# Patient Record
Sex: Female | Born: 1945 | Race: Black or African American | Hispanic: No | Marital: Married | Smoking: Former smoker
Health system: Southern US, Community
[De-identification: ages and names within clinical notes are randomized; demographics above are authoritative.]

## PROBLEM LIST (undated history)

## (undated) DIAGNOSIS — M549 Dorsalgia, unspecified: Secondary | ICD-10-CM

## (undated) DIAGNOSIS — E669 Obesity, unspecified: Secondary | ICD-10-CM

## (undated) DIAGNOSIS — F319 Bipolar disorder, unspecified: Secondary | ICD-10-CM

## (undated) DIAGNOSIS — Z801 Family history of malignant neoplasm of trachea, bronchus and lung: Secondary | ICD-10-CM

## (undated) DIAGNOSIS — I10 Essential (primary) hypertension: Secondary | ICD-10-CM

## (undated) DIAGNOSIS — R2681 Unsteadiness on feet: Secondary | ICD-10-CM

## (undated) DIAGNOSIS — M1990 Unspecified osteoarthritis, unspecified site: Secondary | ICD-10-CM

## (undated) DIAGNOSIS — K5790 Diverticulosis of intestine, part unspecified, without perforation or abscess without bleeding: Secondary | ICD-10-CM

## (undated) DIAGNOSIS — Z800 Family history of malignant neoplasm of digestive organs: Secondary | ICD-10-CM

## (undated) DIAGNOSIS — R0600 Dyspnea, unspecified: Secondary | ICD-10-CM

## (undated) DIAGNOSIS — F419 Anxiety disorder, unspecified: Secondary | ICD-10-CM

## (undated) DIAGNOSIS — Z8042 Family history of malignant neoplasm of prostate: Secondary | ICD-10-CM

## (undated) DIAGNOSIS — E785 Hyperlipidemia, unspecified: Secondary | ICD-10-CM

## (undated) DIAGNOSIS — K219 Gastro-esophageal reflux disease without esophagitis: Secondary | ICD-10-CM

## (undated) DIAGNOSIS — G8929 Other chronic pain: Secondary | ICD-10-CM

## (undated) DIAGNOSIS — J449 Chronic obstructive pulmonary disease, unspecified: Secondary | ICD-10-CM

## (undated) DIAGNOSIS — R0609 Other forms of dyspnea: Secondary | ICD-10-CM

## (undated) DIAGNOSIS — S82892A Other fracture of left lower leg, initial encounter for closed fracture: Secondary | ICD-10-CM

## (undated) DIAGNOSIS — F17200 Nicotine dependence, unspecified, uncomplicated: Secondary | ICD-10-CM

## (undated) DIAGNOSIS — R296 Repeated falls: Secondary | ICD-10-CM

## (undated) HISTORY — DX: Family history of malignant neoplasm of prostate: Z80.42

## (undated) HISTORY — DX: Hyperlipidemia, unspecified: E78.5

## (undated) HISTORY — DX: Unspecified osteoarthritis, unspecified site: M19.90

## (undated) HISTORY — DX: Family history of malignant neoplasm of digestive organs: Z80.0

## (undated) HISTORY — DX: Other fracture of left lower leg, initial encounter for closed fracture: S82.892A

## (undated) HISTORY — DX: Anxiety disorder, unspecified: F41.9

## (undated) HISTORY — DX: Obesity, unspecified: E66.9

## (undated) HISTORY — DX: Essential (primary) hypertension: I10

## (undated) HISTORY — DX: Gastro-esophageal reflux disease without esophagitis: K21.9

## (undated) HISTORY — DX: Dorsalgia, unspecified: M54.9

## (undated) HISTORY — DX: Other chronic pain: G89.29

## (undated) HISTORY — DX: Nicotine dependence, unspecified, uncomplicated: F17.200

## (undated) HISTORY — PX: SPINE SURGERY: SHX786

## (undated) HISTORY — DX: Family history of malignant neoplasm of trachea, bronchus and lung: Z80.1

## (undated) HISTORY — PX: TONSILLECTOMY: SUR1361

---

## 1978-08-13 HISTORY — PX: OTHER SURGICAL HISTORY: SHX169

## 1982-08-13 HISTORY — PX: TOTAL ABDOMINAL HYSTERECTOMY W/ BILATERAL SALPINGOOPHORECTOMY: SHX83

## 1993-08-13 DIAGNOSIS — E785 Hyperlipidemia, unspecified: Secondary | ICD-10-CM

## 1993-08-13 DIAGNOSIS — I10 Essential (primary) hypertension: Secondary | ICD-10-CM

## 1993-08-13 HISTORY — DX: Hyperlipidemia, unspecified: E78.5

## 1993-08-13 HISTORY — DX: Essential (primary) hypertension: I10

## 1997-08-13 HISTORY — PX: BACK SURGERY: SHX140

## 1998-08-13 DIAGNOSIS — K219 Gastro-esophageal reflux disease without esophagitis: Secondary | ICD-10-CM

## 1998-08-13 HISTORY — DX: Gastro-esophageal reflux disease without esophagitis: K21.9

## 1999-08-14 HISTORY — PX: CHOLECYSTECTOMY: SHX55

## 2000-11-21 ENCOUNTER — Encounter: Payer: Self-pay | Admitting: Internal Medicine

## 2000-11-21 ENCOUNTER — Ambulatory Visit (HOSPITAL_COMMUNITY): Admission: RE | Admit: 2000-11-21 | Discharge: 2000-11-21 | Payer: Self-pay | Admitting: Internal Medicine

## 2000-12-03 ENCOUNTER — Ambulatory Visit (HOSPITAL_COMMUNITY): Admission: RE | Admit: 2000-12-03 | Discharge: 2000-12-03 | Payer: Self-pay | Admitting: Family Medicine

## 2000-12-03 ENCOUNTER — Encounter: Payer: Self-pay | Admitting: Family Medicine

## 2000-12-22 ENCOUNTER — Emergency Department (HOSPITAL_COMMUNITY): Admission: EM | Admit: 2000-12-22 | Discharge: 2000-12-22 | Payer: Self-pay | Admitting: Emergency Medicine

## 2000-12-22 ENCOUNTER — Encounter: Payer: Self-pay | Admitting: Emergency Medicine

## 2000-12-31 ENCOUNTER — Encounter: Payer: Self-pay | Admitting: Family Medicine

## 2000-12-31 ENCOUNTER — Ambulatory Visit (HOSPITAL_COMMUNITY): Admission: RE | Admit: 2000-12-31 | Discharge: 2000-12-31 | Payer: Self-pay | Admitting: Family Medicine

## 2001-04-02 ENCOUNTER — Encounter: Payer: Self-pay | Admitting: Internal Medicine

## 2001-04-02 ENCOUNTER — Emergency Department (HOSPITAL_COMMUNITY): Admission: EM | Admit: 2001-04-02 | Discharge: 2001-04-02 | Payer: Self-pay | Admitting: Internal Medicine

## 2001-11-05 ENCOUNTER — Encounter: Payer: Self-pay | Admitting: Family Medicine

## 2001-11-05 ENCOUNTER — Ambulatory Visit (HOSPITAL_COMMUNITY): Admission: RE | Admit: 2001-11-05 | Discharge: 2001-11-05 | Payer: Self-pay | Admitting: Family Medicine

## 2002-01-12 ENCOUNTER — Emergency Department (HOSPITAL_COMMUNITY): Admission: EM | Admit: 2002-01-12 | Discharge: 2002-01-12 | Payer: Self-pay | Admitting: Emergency Medicine

## 2002-01-23 ENCOUNTER — Encounter: Payer: Self-pay | Admitting: Emergency Medicine

## 2002-01-23 ENCOUNTER — Emergency Department (HOSPITAL_COMMUNITY): Admission: EM | Admit: 2002-01-23 | Discharge: 2002-01-23 | Payer: Self-pay | Admitting: Emergency Medicine

## 2003-07-13 ENCOUNTER — Ambulatory Visit (HOSPITAL_COMMUNITY): Admission: RE | Admit: 2003-07-13 | Discharge: 2003-07-13 | Payer: Self-pay | Admitting: Family Medicine

## 2003-10-18 ENCOUNTER — Ambulatory Visit (HOSPITAL_COMMUNITY): Admission: RE | Admit: 2003-10-18 | Discharge: 2003-10-18 | Payer: Self-pay | Admitting: General Surgery

## 2004-08-16 ENCOUNTER — Ambulatory Visit (HOSPITAL_COMMUNITY): Admission: RE | Admit: 2004-08-16 | Discharge: 2004-08-16 | Payer: Self-pay | Admitting: *Deleted

## 2004-12-27 ENCOUNTER — Ambulatory Visit: Payer: Self-pay | Admitting: Family Medicine

## 2005-01-01 ENCOUNTER — Ambulatory Visit (HOSPITAL_COMMUNITY): Admission: RE | Admit: 2005-01-01 | Discharge: 2005-01-01 | Payer: Self-pay | Admitting: Family Medicine

## 2005-01-07 ENCOUNTER — Emergency Department (HOSPITAL_COMMUNITY): Admission: EM | Admit: 2005-01-07 | Discharge: 2005-01-07 | Payer: Self-pay | Admitting: Emergency Medicine

## 2005-03-01 ENCOUNTER — Ambulatory Visit: Payer: Self-pay | Admitting: Family Medicine

## 2005-04-20 ENCOUNTER — Ambulatory Visit: Payer: Self-pay | Admitting: Family Medicine

## 2005-06-11 ENCOUNTER — Ambulatory Visit: Payer: Self-pay | Admitting: Family Medicine

## 2005-07-24 ENCOUNTER — Ambulatory Visit: Payer: Self-pay | Admitting: Family Medicine

## 2005-11-01 ENCOUNTER — Ambulatory Visit: Payer: Self-pay | Admitting: Family Medicine

## 2005-11-21 ENCOUNTER — Ambulatory Visit: Payer: Self-pay | Admitting: Family Medicine

## 2005-11-21 ENCOUNTER — Emergency Department (HOSPITAL_COMMUNITY): Admission: EM | Admit: 2005-11-21 | Discharge: 2005-11-21 | Payer: Self-pay | Admitting: Emergency Medicine

## 2005-12-18 ENCOUNTER — Ambulatory Visit: Payer: Self-pay | Admitting: Family Medicine

## 2005-12-28 ENCOUNTER — Ambulatory Visit: Payer: Self-pay | Admitting: Family Medicine

## 2006-01-03 ENCOUNTER — Ambulatory Visit (HOSPITAL_COMMUNITY): Admission: RE | Admit: 2006-01-03 | Discharge: 2006-01-03 | Payer: Self-pay | Admitting: Family Medicine

## 2006-02-12 ENCOUNTER — Ambulatory Visit: Payer: Self-pay | Admitting: Family Medicine

## 2006-03-21 ENCOUNTER — Encounter: Admission: RE | Admit: 2006-03-21 | Discharge: 2006-03-21 | Payer: Self-pay | Admitting: Family Medicine

## 2006-03-27 ENCOUNTER — Ambulatory Visit: Payer: Self-pay | Admitting: Family Medicine

## 2006-04-11 ENCOUNTER — Encounter: Admission: RE | Admit: 2006-04-11 | Discharge: 2006-04-11 | Payer: Self-pay | Admitting: Family Medicine

## 2006-05-10 ENCOUNTER — Ambulatory Visit (HOSPITAL_COMMUNITY): Admission: RE | Admit: 2006-05-10 | Discharge: 2006-05-10 | Payer: Self-pay | Admitting: Family Medicine

## 2006-05-17 ENCOUNTER — Encounter: Admission: RE | Admit: 2006-05-17 | Discharge: 2006-05-17 | Payer: Self-pay | Admitting: Family Medicine

## 2006-09-23 ENCOUNTER — Ambulatory Visit: Payer: Self-pay | Admitting: Family Medicine

## 2006-09-23 LAB — CONVERTED CEMR LAB
AST: 14 units/L (ref 0–37)
Albumin: 4.2 g/dL (ref 3.5–5.2)
BUN: 14 mg/dL (ref 6–23)
Bilirubin, Direct: <0.1 mg/dL (ref 0.0–0.3)
CO2: 27 meq/L (ref 19–32)
Calcium: 9.4 mg/dL (ref 8.4–10.5)
LDL Cholesterol: 123 mg/dL — ABNORMAL HIGH (ref 0–99)
Total CHOL/HDL Ratio: 5.4
Total Protein: 6.9 g/dL (ref 6.0–8.3)
VLDL: 53 mg/dL — ABNORMAL HIGH (ref 0–40)

## 2006-10-11 ENCOUNTER — Ambulatory Visit: Payer: Self-pay | Admitting: Family Medicine

## 2007-01-29 ENCOUNTER — Ambulatory Visit: Payer: Self-pay | Admitting: Family Medicine

## 2007-02-25 ENCOUNTER — Encounter: Payer: Self-pay | Admitting: Family Medicine

## 2007-02-25 LAB — CONVERTED CEMR LAB
AST: 12 units/L (ref 0–37)
Albumin: 4.5 g/dL (ref 3.5–5.2)
Cholesterol: 187 mg/dL (ref 0–200)
LDL Cholesterol: 92 mg/dL (ref 0–99)
Total Bilirubin: 0.4 mg/dL (ref 0.3–1.2)
Total CHOL/HDL Ratio: 3.8
Triglycerides: 229 mg/dL — ABNORMAL HIGH (ref <150)
VLDL: 46 mg/dL — ABNORMAL HIGH (ref 0–40)

## 2007-03-29 ENCOUNTER — Inpatient Hospital Stay (HOSPITAL_COMMUNITY): Admission: EM | Admit: 2007-03-29 | Discharge: 2007-03-31 | Payer: Self-pay | Admitting: Emergency Medicine

## 2007-04-02 ENCOUNTER — Ambulatory Visit: Payer: Self-pay | Admitting: Family Medicine

## 2007-04-24 ENCOUNTER — Ambulatory Visit: Payer: Self-pay | Admitting: Thoracic Surgery

## 2007-05-01 ENCOUNTER — Ambulatory Visit: Payer: Self-pay | Admitting: Family Medicine

## 2007-05-01 ENCOUNTER — Encounter: Payer: Self-pay | Admitting: Family Medicine

## 2007-05-01 ENCOUNTER — Other Ambulatory Visit: Admission: RE | Admit: 2007-05-01 | Discharge: 2007-05-01 | Payer: Self-pay | Admitting: Family Medicine

## 2007-05-02 ENCOUNTER — Encounter (INDEPENDENT_AMBULATORY_CARE_PROVIDER_SITE_OTHER): Payer: Self-pay | Admitting: *Deleted

## 2007-07-02 ENCOUNTER — Encounter: Admission: RE | Admit: 2007-07-02 | Discharge: 2007-07-02 | Payer: Self-pay | Admitting: Thoracic Surgery

## 2007-07-02 ENCOUNTER — Ambulatory Visit: Payer: Self-pay | Admitting: Thoracic Surgery

## 2007-07-21 ENCOUNTER — Ambulatory Visit: Payer: Self-pay | Admitting: Family Medicine

## 2007-08-01 ENCOUNTER — Ambulatory Visit (HOSPITAL_COMMUNITY): Admission: RE | Admit: 2007-08-01 | Discharge: 2007-08-01 | Payer: Self-pay | Admitting: Family Medicine

## 2007-08-12 ENCOUNTER — Ambulatory Visit: Payer: Self-pay | Admitting: Family Medicine

## 2007-08-14 ENCOUNTER — Encounter: Payer: Self-pay | Admitting: Family Medicine

## 2007-10-16 ENCOUNTER — Ambulatory Visit: Payer: Self-pay | Admitting: Family Medicine

## 2007-11-10 ENCOUNTER — Ambulatory Visit: Payer: Self-pay | Admitting: Family Medicine

## 2007-11-13 ENCOUNTER — Encounter: Payer: Self-pay | Admitting: Family Medicine

## 2007-11-13 LAB — CONVERTED CEMR LAB
BUN: 15 mg/dL (ref 6–23)
Basophils Absolute: 0 10*3/uL (ref 0.0–0.1)
Basophils Relative: 0 % (ref 0–1)
Eosinophils Absolute: 0.2 10*3/uL (ref 0.0–0.7)
Eosinophils Relative: 4 % (ref 0–5)
HCT: 37.7 % (ref 36.0–46.0)
Hemoglobin: 11.7 g/dL — ABNORMAL LOW (ref 12.0–15.0)
Lymphs Abs: 3.5 10*3/uL (ref 0.7–4.0)
Monocytes Absolute: 0.4 10*3/uL (ref 0.1–1.0)
Monocytes Relative: 8 % (ref 3–12)
Neutrophils Relative %: 21 % — ABNORMAL LOW (ref 43–77)
Platelets: 261 10*3/uL (ref 150–400)
RBC: 4.13 M/uL (ref 3.87–5.11)
RDW: 13.3 % (ref 11.5–15.5)
Sodium: 144 meq/L (ref 135–145)

## 2007-11-14 ENCOUNTER — Encounter: Payer: Self-pay | Admitting: Family Medicine

## 2007-11-14 ENCOUNTER — Encounter (INDEPENDENT_AMBULATORY_CARE_PROVIDER_SITE_OTHER): Payer: Self-pay | Admitting: *Deleted

## 2007-11-14 DIAGNOSIS — E785 Hyperlipidemia, unspecified: Secondary | ICD-10-CM

## 2007-11-14 DIAGNOSIS — K219 Gastro-esophageal reflux disease without esophagitis: Secondary | ICD-10-CM

## 2007-11-14 DIAGNOSIS — F419 Anxiety disorder, unspecified: Secondary | ICD-10-CM

## 2007-11-14 LAB — CONVERTED CEMR LAB
ALT: 17 units/L (ref 0–35)
Albumin: 4.1 g/dL (ref 3.5–5.2)
Alkaline Phosphatase: 90 units/L (ref 39–117)
Direct LDL: 85 mg/dL
Total Bilirubin: 0.2 mg/dL — ABNORMAL LOW (ref 0.3–1.2)
Total Protein: 6.5 g/dL (ref 6.0–8.3)

## 2007-12-31 ENCOUNTER — Ambulatory Visit: Payer: Self-pay | Admitting: Thoracic Surgery

## 2007-12-31 ENCOUNTER — Encounter: Admission: RE | Admit: 2007-12-31 | Discharge: 2007-12-31 | Payer: Self-pay | Admitting: Thoracic Surgery

## 2008-01-02 ENCOUNTER — Ambulatory Visit (HOSPITAL_COMMUNITY): Admission: RE | Admit: 2008-01-02 | Discharge: 2008-01-02 | Payer: Self-pay | Admitting: Thoracic Surgery

## 2008-01-14 ENCOUNTER — Ambulatory Visit: Payer: Self-pay | Admitting: Thoracic Surgery

## 2008-03-18 ENCOUNTER — Encounter: Payer: Self-pay | Admitting: Family Medicine

## 2008-03-18 ENCOUNTER — Ambulatory Visit: Payer: Self-pay | Admitting: Family Medicine

## 2008-03-24 ENCOUNTER — Encounter: Payer: Self-pay | Admitting: Family Medicine

## 2008-03-24 ENCOUNTER — Encounter: Admission: RE | Admit: 2008-03-24 | Discharge: 2008-03-24 | Payer: Self-pay | Admitting: Family Medicine

## 2008-03-24 ENCOUNTER — Telehealth: Payer: Self-pay | Admitting: Family Medicine

## 2008-04-05 ENCOUNTER — Telehealth: Payer: Self-pay | Admitting: Family Medicine

## 2008-04-13 ENCOUNTER — Encounter: Admission: RE | Admit: 2008-04-13 | Discharge: 2008-04-13 | Payer: Self-pay | Admitting: Family Medicine

## 2008-04-13 ENCOUNTER — Encounter: Payer: Self-pay | Admitting: Family Medicine

## 2008-04-27 ENCOUNTER — Encounter: Admission: RE | Admit: 2008-04-27 | Discharge: 2008-04-27 | Payer: Self-pay | Admitting: Family Medicine

## 2008-05-04 ENCOUNTER — Encounter: Payer: Self-pay | Admitting: Family Medicine

## 2008-05-31 ENCOUNTER — Telehealth: Payer: Self-pay | Admitting: Family Medicine

## 2008-06-07 ENCOUNTER — Ambulatory Visit: Payer: Self-pay | Admitting: Family Medicine

## 2008-06-07 LAB — CONVERTED CEMR LAB
AST: 13 units/L (ref 0–37)
Alkaline Phosphatase: 70 units/L (ref 39–117)
Calcium: 10 mg/dL (ref 8.4–10.5)
Chloride: 105 meq/L (ref 96–112)
Cholesterol: 309 mg/dL — ABNORMAL HIGH (ref 0–200)
Creatinine, Ser: 0.8 mg/dL (ref 0.40–1.20)
Glucose, Bld: 89 mg/dL (ref 70–99)
HDL: 74 mg/dL (ref >39)
LDL Cholesterol: 183 mg/dL — ABNORMAL HIGH (ref 0–99)
Triglycerides: 258 mg/dL — ABNORMAL HIGH (ref <150)
VLDL: 52 mg/dL — ABNORMAL HIGH (ref 0–40)

## 2008-06-09 ENCOUNTER — Ambulatory Visit: Payer: Self-pay | Admitting: Family Medicine

## 2008-06-09 DIAGNOSIS — I10 Essential (primary) hypertension: Secondary | ICD-10-CM | POA: Insufficient documentation

## 2008-06-09 DIAGNOSIS — M542 Cervicalgia: Secondary | ICD-10-CM | POA: Insufficient documentation

## 2008-06-09 DIAGNOSIS — K439 Ventral hernia without obstruction or gangrene: Secondary | ICD-10-CM | POA: Insufficient documentation

## 2008-06-11 ENCOUNTER — Encounter: Payer: Self-pay | Admitting: Family Medicine

## 2008-06-16 ENCOUNTER — Ambulatory Visit (HOSPITAL_COMMUNITY): Admission: RE | Admit: 2008-06-16 | Discharge: 2008-06-16 | Payer: Self-pay | Admitting: Family Medicine

## 2008-06-18 ENCOUNTER — Encounter: Payer: Self-pay | Admitting: Family Medicine

## 2008-06-22 ENCOUNTER — Telehealth: Payer: Self-pay | Admitting: Family Medicine

## 2008-07-07 ENCOUNTER — Telehealth: Payer: Self-pay | Admitting: Family Medicine

## 2008-08-13 ENCOUNTER — Encounter: Payer: Self-pay | Admitting: Family Medicine

## 2008-08-17 HISTORY — PX: INCISIONAL HERNIA REPAIR: SHX193

## 2008-08-18 ENCOUNTER — Inpatient Hospital Stay (HOSPITAL_COMMUNITY): Admission: RE | Admit: 2008-08-18 | Discharge: 2008-08-19 | Payer: Self-pay | Admitting: General Surgery

## 2008-08-19 ENCOUNTER — Encounter: Payer: Self-pay | Admitting: Family Medicine

## 2008-09-13 ENCOUNTER — Encounter: Payer: Self-pay | Admitting: Family Medicine

## 2008-09-14 ENCOUNTER — Ambulatory Visit: Payer: Self-pay | Admitting: Family Medicine

## 2008-09-14 LAB — CONVERTED CEMR LAB
ALT: 17 units/L (ref 0–35)
Alkaline Phosphatase: 81 units/L (ref 39–117)
Cholesterol: 144 mg/dL (ref 0–200)
Indirect Bilirubin: 0.3 mg/dL (ref 0.0–0.9)
Triglycerides: 165 mg/dL — ABNORMAL HIGH (ref <150)

## 2008-11-23 ENCOUNTER — Telehealth: Payer: Self-pay | Admitting: Family Medicine

## 2009-01-05 ENCOUNTER — Telehealth: Payer: Self-pay | Admitting: Family Medicine

## 2009-02-16 ENCOUNTER — Telehealth: Payer: Self-pay | Admitting: Family Medicine

## 2009-02-25 ENCOUNTER — Ambulatory Visit: Payer: Self-pay | Admitting: Family Medicine

## 2009-02-28 LAB — CONVERTED CEMR LAB
ALT: 14 units/L (ref 0–35)
AST: 14 units/L (ref 0–37)
Alkaline Phosphatase: 67 units/L (ref 39–117)
BUN: 21 mg/dL (ref 6–23)
Bilirubin, Direct: 0.1 mg/dL (ref 0.0–0.3)
CO2: 27 meq/L (ref 19–32)
Cholesterol: 124 mg/dL (ref 0–200)
Glucose, Bld: 83 mg/dL (ref 70–99)
Indirect Bilirubin: 0.3 mg/dL (ref 0.0–0.9)
Total Protein: 7.2 g/dL (ref 6.0–8.3)
Triglycerides: 231 mg/dL — ABNORMAL HIGH (ref <150)
VLDL: 46 mg/dL — ABNORMAL HIGH (ref 0–40)

## 2009-03-23 ENCOUNTER — Encounter: Admission: RE | Admit: 2009-03-23 | Discharge: 2009-03-23 | Payer: Self-pay | Admitting: Thoracic Surgery

## 2009-03-23 ENCOUNTER — Ambulatory Visit: Payer: Self-pay | Admitting: Thoracic Surgery

## 2009-04-04 ENCOUNTER — Ambulatory Visit: Payer: Self-pay | Admitting: Family Medicine

## 2009-04-04 DIAGNOSIS — M5430 Sciatica, unspecified side: Secondary | ICD-10-CM

## 2009-04-04 DIAGNOSIS — M549 Dorsalgia, unspecified: Secondary | ICD-10-CM

## 2009-04-15 ENCOUNTER — Telehealth: Payer: Self-pay | Admitting: Family Medicine

## 2009-04-19 ENCOUNTER — Ambulatory Visit (HOSPITAL_COMMUNITY): Admission: RE | Admit: 2009-04-19 | Discharge: 2009-04-19 | Payer: Self-pay | Admitting: Family Medicine

## 2009-05-09 ENCOUNTER — Telehealth: Payer: Self-pay | Admitting: Family Medicine

## 2009-05-12 ENCOUNTER — Telehealth: Payer: Self-pay | Admitting: Family Medicine

## 2009-05-12 ENCOUNTER — Ambulatory Visit: Payer: Self-pay | Admitting: Family Medicine

## 2009-05-17 ENCOUNTER — Encounter: Payer: Self-pay | Admitting: Family Medicine

## 2009-06-03 ENCOUNTER — Encounter: Payer: Self-pay | Admitting: Family Medicine

## 2009-08-09 ENCOUNTER — Encounter: Payer: Self-pay | Admitting: Family Medicine

## 2009-09-01 ENCOUNTER — Encounter: Payer: Self-pay | Admitting: Family Medicine

## 2009-09-08 ENCOUNTER — Ambulatory Visit: Payer: Self-pay | Admitting: Family Medicine

## 2009-09-08 DIAGNOSIS — R5383 Other fatigue: Secondary | ICD-10-CM

## 2009-09-08 DIAGNOSIS — E559 Vitamin D deficiency, unspecified: Secondary | ICD-10-CM | POA: Insufficient documentation

## 2009-09-08 DIAGNOSIS — R5381 Other malaise: Secondary | ICD-10-CM

## 2009-09-12 ENCOUNTER — Encounter: Payer: Self-pay | Admitting: Family Medicine

## 2009-09-12 ENCOUNTER — Telehealth: Payer: Self-pay | Admitting: Family Medicine

## 2009-09-13 LAB — CONVERTED CEMR LAB
ALT: 9 units/L (ref 0–35)
BUN: 14 mg/dL (ref 6–23)
Basophils Absolute: 0 10*3/uL (ref 0.0–0.1)
Bilirubin, Direct: 0.1 mg/dL (ref 0.0–0.3)
CO2: 28 meq/L (ref 19–32)
Cholesterol: 124 mg/dL (ref 0–200)
Eosinophils Relative: 2 % (ref 0–5)
Glucose, Bld: 102 mg/dL — ABNORMAL HIGH (ref 70–99)
HCT: 42.3 % (ref 36.0–46.0)
Hemoglobin: 13 g/dL (ref 12.0–15.0)
Indirect Bilirubin: 0.2 mg/dL (ref 0.0–0.9)
LDL Cholesterol: 38 mg/dL (ref 0–99)
Lymphocytes Relative: 54 % — ABNORMAL HIGH (ref 12–46)
MCHC: 30.7 g/dL (ref 30.0–36.0)
Monocytes Absolute: 0.4 10*3/uL (ref 0.1–1.0)
Monocytes Relative: 8 % (ref 3–12)
Potassium: 4.6 meq/L (ref 3.5–5.3)
RDW: 13.1 % (ref 11.5–15.5)
TSH: 0.561 microintl units/mL (ref 0.350–4.500)
Total Bilirubin: 0.3 mg/dL (ref 0.3–1.2)
VLDL: 37 mg/dL (ref 0–40)
Vit D, 25-Hydroxy: <10 ng/mL — ABNORMAL LOW (ref 30–89)

## 2009-09-15 ENCOUNTER — Ambulatory Visit (HOSPITAL_COMMUNITY): Admission: RE | Admit: 2009-09-15 | Discharge: 2009-09-15 | Payer: Self-pay | Admitting: Family Medicine

## 2009-12-06 ENCOUNTER — Encounter: Payer: Self-pay | Admitting: Family Medicine

## 2009-12-09 ENCOUNTER — Ambulatory Visit (HOSPITAL_COMMUNITY): Admission: RE | Admit: 2009-12-09 | Discharge: 2009-12-09 | Payer: Self-pay | Admitting: Family Medicine

## 2009-12-09 ENCOUNTER — Ambulatory Visit: Payer: Self-pay | Admitting: Family Medicine

## 2009-12-09 DIAGNOSIS — M25579 Pain in unspecified ankle and joints of unspecified foot: Secondary | ICD-10-CM | POA: Insufficient documentation

## 2009-12-13 ENCOUNTER — Ambulatory Visit (HOSPITAL_COMMUNITY): Admission: RE | Admit: 2009-12-13 | Discharge: 2009-12-13 | Payer: Self-pay | Admitting: Neurosurgery

## 2010-01-06 DIAGNOSIS — S82892A Other fracture of left lower leg, initial encounter for closed fracture: Secondary | ICD-10-CM

## 2010-01-06 HISTORY — DX: Other fracture of left lower leg, initial encounter for closed fracture: S82.892A

## 2010-01-11 ENCOUNTER — Ambulatory Visit: Payer: Self-pay | Admitting: Thoracic Surgery

## 2010-01-11 ENCOUNTER — Encounter: Admission: RE | Admit: 2010-01-11 | Discharge: 2010-01-11 | Payer: Self-pay | Admitting: Thoracic Surgery

## 2010-01-11 ENCOUNTER — Encounter: Payer: Self-pay | Admitting: Family Medicine

## 2010-01-18 ENCOUNTER — Ambulatory Visit: Payer: Self-pay | Admitting: Family Medicine

## 2010-01-25 LAB — CONVERTED CEMR LAB
ALT: 16 units/L (ref 0–35)
AST: 17 units/L (ref 0–37)
Albumin: 4.8 g/dL (ref 3.5–5.2)
Alkaline Phosphatase: 84 units/L (ref 39–117)
Cholesterol: 132 mg/dL (ref 0–200)
HDL: 42 mg/dL (ref >39)
Total CHOL/HDL Ratio: 3.1
Total Protein: 7.6 g/dL (ref 6.0–8.3)
Triglycerides: 296 mg/dL — ABNORMAL HIGH (ref <150)
Vit D, 25-Hydroxy: 61 ng/mL (ref 30–89)

## 2010-02-08 ENCOUNTER — Encounter: Admission: RE | Admit: 2010-02-08 | Discharge: 2010-02-08 | Payer: Self-pay | Admitting: Thoracic Surgery

## 2010-02-08 ENCOUNTER — Ambulatory Visit: Payer: Self-pay | Admitting: Thoracic Surgery

## 2010-02-17 ENCOUNTER — Encounter: Payer: Self-pay | Admitting: Family Medicine

## 2010-02-17 ENCOUNTER — Ambulatory Visit: Payer: Self-pay | Admitting: Thoracic Surgery

## 2010-03-27 ENCOUNTER — Inpatient Hospital Stay (HOSPITAL_COMMUNITY): Admission: RE | Admit: 2010-03-27 | Discharge: 2010-03-30 | Payer: Self-pay | Admitting: Thoracic Surgery

## 2010-03-27 ENCOUNTER — Encounter: Payer: Self-pay | Admitting: Thoracic Surgery

## 2010-03-27 ENCOUNTER — Ambulatory Visit: Payer: Self-pay | Admitting: Thoracic Surgery

## 2010-03-27 HISTORY — PX: OTHER SURGICAL HISTORY: SHX169

## 2010-04-05 ENCOUNTER — Encounter: Admission: RE | Admit: 2010-04-05 | Discharge: 2010-04-05 | Payer: Self-pay | Admitting: Thoracic Surgery

## 2010-04-05 ENCOUNTER — Ambulatory Visit: Payer: Self-pay | Admitting: Thoracic Surgery

## 2010-04-06 ENCOUNTER — Telehealth: Payer: Self-pay | Admitting: Family Medicine

## 2010-04-26 ENCOUNTER — Ambulatory Visit: Payer: Self-pay | Admitting: Thoracic Surgery

## 2010-04-26 ENCOUNTER — Encounter: Payer: Self-pay | Admitting: Family Medicine

## 2010-04-26 ENCOUNTER — Encounter: Admission: RE | Admit: 2010-04-26 | Discharge: 2010-04-26 | Payer: Self-pay | Admitting: Thoracic Surgery

## 2010-05-03 ENCOUNTER — Ambulatory Visit: Payer: Self-pay | Admitting: Thoracic Surgery

## 2010-05-18 ENCOUNTER — Telehealth: Payer: Self-pay | Admitting: Family Medicine

## 2010-05-24 ENCOUNTER — Ambulatory Visit: Payer: Self-pay | Admitting: Family Medicine

## 2010-05-31 ENCOUNTER — Telehealth: Payer: Self-pay | Admitting: Family Medicine

## 2010-06-19 ENCOUNTER — Telehealth: Payer: Self-pay | Admitting: Family Medicine

## 2010-06-30 ENCOUNTER — Telehealth: Payer: Self-pay | Admitting: Family Medicine

## 2010-07-26 ENCOUNTER — Ambulatory Visit: Payer: Self-pay | Admitting: Thoracic Surgery

## 2010-08-11 ENCOUNTER — Telehealth: Payer: Self-pay | Admitting: Family Medicine

## 2010-09-03 ENCOUNTER — Encounter: Payer: Self-pay | Admitting: Thoracic Surgery

## 2010-09-03 ENCOUNTER — Encounter: Payer: Self-pay | Admitting: Family Medicine

## 2010-09-03 ENCOUNTER — Encounter: Payer: Self-pay | Admitting: Internal Medicine

## 2010-09-04 ENCOUNTER — Encounter: Payer: Self-pay | Admitting: Family Medicine

## 2010-09-14 NOTE — Progress Notes (Signed)
Summary: rx  Phone Note Call from Patient   Summary of Call: needs her hydrocodone and ultram [facility] to [facility] Initial call taken by: Lind Guest,  May 18, 2010 1:31 PM    Prescriptions: TRAMADOL HCL 50 MG  TABS (TRAMADOL HCL) two tabs by mouth two times a day  #120 x 0   Entered by:   Everitt Amber LPN   Authorized by:   Syliva Overman MD   Signed by:   Everitt Amber LPN on 14/78/2956   Method used:   Printed then faxed to ...       [facility]  [address] (retail)       [address]       [place], [place]  [address]       Ph: [phone]       Fax: [phone]   RxID:   [id number] HYDROCODONE-ACETAMINOPHEN 5-500 MG  TABS (HYDROCODONE-ACETAMINOPHEN) one tab by mouth three times a day  #90 x 0   Entered by:   Everitt Amber LPN   Authorized by:   Syliva Overman MD   Signed by:   Everitt Amber LPN on 25/95/6387   Method used:   Printed then faxed to ...       [facility] (retail)       [address]       [place], [place]  [address]       Ph: [phone]       Fax: [phone]   RxID:   [id number]

## 2010-09-14 NOTE — Assessment & Plan Note (Signed)
Summary: F UP   Vital Signs:  Patient profile:   65 year old female Menstrual status:  hysterectomy Height:      64 inches Weight:      197 pounds BMI:     33.94 O2 Sat:      90 % Pulse rate:   87 / minute Pulse rhythm:   regular Resp:     16 per minute BP sitting:   110 / 68  (left arm) Cuff size:   large  Vitals Entered By: Everitt Amber LPN (January 19, 5620 9:07 AM)  Nutrition Counseling: Patient's BMI is greater than 25 and therefore counseled on weight management options. CC: Follow up chronic problems   CC:  Follow up chronic problems.  History of Present Illness: pt was scheduled for bck surgery in May, by dr Channing Mutters, however a few days before she fell and obtained a  hairline fracture of her left leg whichis followed by dr Farris Has. 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 she denies cardiovasculr symptoms. had all the preop workup in may for back surgery,  Gets chilly at times, uses ac, no cough, no respiratory symptoms. No urinary symptoms .  Preventive Screening-Counseling & Management  Alcohol-Tobacco     Smoking Cessation Counseling: yes  Current Medications (verified): 1)  Zanaflex 4 Mg  Tabs (Tizanidine Hcl) .... One Tab By Mouth Three Times A Day 2)  Klor-Con M20 20 Meq  Tbcr (Potassium Chloride Crys Cr) .... One Tab By Mouth Two Times A Day 3)  Tramadol Hcl 50 Mg  Tabs (Tramadol Hcl) .... Two Tabs By Mouth Two Times A Day 4)  Cardizem La 120 Mg  Tb24 (Diltiazem Hcl Coated Beads) .... One Tab By Mouth Once Daily 5)  Furosemide 20 Mg  Tabs (Furosemide) .... One Tab By Mouth Two Times A Day 6)  Lisinopril 10 Mg  Tabs (Lisinopril) .... One Tab By Mouth Once Daily 7)  Celebrex 200 Mg  Caps (Celecoxib) .... One Tab By Mouth Bid 8)  Aspirin 81 Mg  Tabs (Aspirin) .... One Tab By Mouth Once Daily 9)  Omeprazole 40 Mg Cpdr (Omeprazole) .... Take 1 Tablet By Mouth Once A  Day 10)  Epipen 0.3 Mg/0.48ml (1:1000)  Devi (Epinephrine Hcl (Anaphylaxis)) .... Uad 11)  Hydrocodone-Acetaminophen 5-500 Mg  Tabs (Hydrocodone-Acetaminophen) .... One Tab By Mouth Three Times A Day 12)  Alprazolam 1 Mg Tabs (Alprazolam) .... One Tab By Mouth Bid 13)  Crestor 40 Mg Tabs (Rosuvastatin Calcium) .... One Tab By Mouth Qhs 14)  Omeprazole 40 Mg Cpdr (Omeprazole) .... Take 1 Capsule By Mouth Once A Day 15)  Vitamin D (Ergocalciferol) 50000 Unit Caps (Ergocalciferol) .... One Cap By Mouth Q Week  Allergies (verified): 1)  ! Codeine  Past History:  Past Medical History: Current Problems:  NICOTINE ADDICTION (ICD-305.1) GERD (ICD-530.81) ANXIETY (ICD-300.00) BACK PAIN, CHRONIC (ICD-724.5) OBESITY (ICD-278.00) HYPERLIPIDEMIA (ICD-272.4) hairline fracture left leg Jan 06, 2010 dr Farris Has is treating .  Review of Systems      See HPI General:  Complains of fatigue and sleep disorder; anxious about increased size of chest tumor, she is still smoking but trying to quit. Eyes:  Denies blurring and discharge. CV:  Denies chest pain or discomfort, difficulty breathing while lying down, palpitations, and swelling of feet. Resp:  Complains of cough; denies shortness of breath and sputum productive. GI:  Denies abdominal pain,  constipation, diarrhea, nausea, and vomiting. GU:  Denies dysuria and urinary frequency. MS:  Complains of joint pain, low back pain, mid back pain, muscle weakness, and stiffness. Psych:  Complains of anxiety; denies depression. Heme:  Denies abnormal bruising, bleeding, enlarge lymph nodes, fevers, pallor, and skin discoloration. Allergy:  Denies hives or rash.  Physical Exam  General:  Well-developed,well-nourished,in no acute distress; alert,appropriate and cooperative throughout examination HEENT: No facial asymmetry,  EOMI, No sinus tenderness, TM's Clear, oropharynx  pink and moist.   Chest: Clear to auscultation bilaterally.  CVS: S1, S2, No  murmurs, No S3.   Abd: Soft, Nontender.  MS: Adequate ROM spine, hips, shoulders and knees.  Ext: No edema.   CNS: CN 2-12 intact, power tone and sensation normal throughout.   Skin: Intact, no visible lesions or rashes.  Psych: Good eye contact, normal affect.  Memory intact, not anxious or depressed appearing.    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 Channing Mutters  Problem # 2:  PULMONARY NODULE (ICD-518.89) Assessment: Deteriorated for removal asap, pt has ongoing nicotine use and is encouraged again to quit  Problem # 3:  HYPERTENSION (ICD-401.9) Assessment: Unchanged  Her updated medication list for this problem includes:    Cardizem La 120 Mg Tb24 (Diltiazem hcl coated beads) ..... One tab by mouth once daily    Furosemide 20 Mg Tabs (Furosemide) ..... One tab by mouth two times a day    Lisinopril 10 Mg Tabs (Lisinopril) ..... One tab by mouth once daily  BP today: 110/68 Prior BP: 110/60 (12/09/2009)  Labs Reviewed: K+: 4.6 (09/12/2009) Creat: : 0.81 (09/12/2009)   Chol: 124 (09/12/2009)   HDL: 49 (09/12/2009)   LDL: 38 (09/12/2009)   TG: 186 (09/12/2009)  Problem # 4:  ANXIETY (ICD-300.00) Assessment: Deteriorated  Her updated medication list for this problem includes:    Alprazolam 1 Mg Tabs (Alprazolam) ..... One tab by mouth bid  Problem # 5:  NICOTINE ADDICTION (ICD-305.1) Assessment: Unchanged  Encouraged smoking cessation and discussed different methods for smoking cessation.   Problem # 6:  OBESITY (ICD-278.00) Assessment: Unchanged  Ht: 64 (01/18/2010)   Wt: 197 (01/18/2010)   BMI: 33.94 (01/18/2010)  Complete Medication List: 1)  Zanaflex 4 Mg Tabs (Tizanidine hcl) .... One tab by mouth three times a day 2)  Klor-con M20 20 Meq Tbcr (Potassium chloride crys cr) .... One tab by mouth two times a day 3)  Tramadol  Hcl 50 Mg Tabs (Tramadol hcl) .... Two tabs by mouth two times a day 4)  Cardizem La 120 Mg Tb24 (Diltiazem hcl coated beads) .... One tab by mouth once daily 5)  Furosemide 20 Mg Tabs (Furosemide) .... One tab by mouth two times a day 6)  Lisinopril 10 Mg Tabs (Lisinopril) .... One tab by mouth once daily 7)  Celebrex 200 Mg Caps (Celecoxib) .... One tab by mouth bid 8)  Aspirin 81 Mg Tabs (Aspirin) .... One tab by mouth once daily 9)  Omeprazole 40 Mg Cpdr (Omeprazole) .... Take 1 tablet by mouth once a day 10)  Epipen 0.3 Mg/0.57ml (1:1000) Devi (Epinephrine hcl (anaphylaxis)) .... Uad 11)  Hydrocodone-acetaminophen 5-500 Mg Tabs (Hydrocodone-acetaminophen) .... One tab by mouth three times a day 12)  Alprazolam 1 Mg Tabs (Alprazolam) .... One tab by mouth bid 13)  Crestor 40 Mg Tabs (Rosuvastatin calcium) .... One tab  by mouth qhs 14)  Omeprazole 40 Mg Cpdr (Omeprazole) .... Take 1 capsule by mouth once a day 15)  Vitamin D (ergocalciferol) 50000 Unit Caps (Ergocalciferol) .... One cap by mouth q week  Other Orders: T-Hepatic Function [id number]) T-Lipid Profile [id number]) T-Vitamin D (25-Hydroxy) [phone])  Patient Instructions: 1)  Please schedule a follow-up appointment in 4 months. 2)  Tobacco is very bad for your health and your loved ones! You Should stop smoking!. 3)  Stop Smoking Tips: Choose a Quit date. Cut down before the Quit date. decide what you will do as a substitute when you feel the urge to smoke(gum,toothpick,exercise). 4)  It is important that you exercise regularly at least 20 minutes 5 times a week. If you develop chest pain, have severe difficulty breathing, or feel very tired , stop exercising immediately and seek medical attention. 5)  You need to lose weight. Consider a lower calorie diet and regular exercise.  6)  Hepatic Panel prior to visit, ICD-9: 7)  Lipid Panel prior to visit, ICD-9:   fasting this week 8)  vitamin D  9)  you need to start a  calcium and vitamin D supplement. 10)  you will need a boe density scan later this year.

## 2010-09-14 NOTE — Assessment & Plan Note (Signed)
Summary: office visit   Vital Signs:  Patient profile:   65 year old female Menstrual status:  hysterectomy Height:      64 inches Weight:      187.25 pounds BMI:     32.26 O2 Sat:      94 % Pulse rate:   91 / minute Pulse rhythm:   regular Resp:     16 per minute BP sitting:   114 / 78  Vitals Entered By: Everitt Amber (September 08, 2009 9:00 AM)  Nutrition Counseling: Patient's BMI is greater than 25 and therefore counseled on weight management options. CC: Follow up chronic problems, already scheduled for back surgery    CC:  Follow up chronic problems and already scheduled for back surgery .  History of Present Illness: Reports  that tshe has been doing well. Denies recent fever or chills. Denies sinus pressure, nasal congestion , ear pain or sore throat. Denies chest congestion, or cough productive of sputum. Denies chest pain, palpitations, PND, orthopnea or leg swelling. Denies abdominal pain, nausea, vomitting, diarrhea or constipation. Denies change in bowel movements or bloody stool. Denies dysuria , frequency, incontinence or hesitancy. Continues to have significant back pain and stiffness, plans to have back surgery in the Spring Denies headaches, vertigo, seizures. Denies depression, anxiety or insomnia. Denies  rash, lesions, or itch.     Current Medications (verified): 1)  Zanaflex 4 Mg  Tabs (Tizanidine Hcl) .... One Tab By Mouth Three Times A Day 2)  Klor-Con M20 20 Meq  Tbcr (Potassium Chloride Crys Cr) .... One Tab By Mouth Two Times A Day 3)  Tramadol Hcl 50 Mg  Tabs (Tramadol Hcl) .... Two Tabs By Mouth Two Times A Day 4)  Cardizem La 120 Mg  Tb24 (Diltiazem Hcl Coated Beads) .... One Tab By Mouth Once Daily 5)  Furosemide 20 Mg  Tabs (Furosemide) .... One Tab By Mouth Two Times A Day 6)  Lisinopril 10 Mg  Tabs (Lisinopril) .... One Tab By Mouth Once Daily 7)  Celebrex 200 Mg  Caps (Celecoxib) .... One Tab By Mouth Bid 8)  Aspirin 81 Mg  Tabs  (Aspirin) .... One Tab By Mouth Once Daily 9)  Nexium 40 Mg  Cpdr (Esomeprazole Magnesium) .... One Cap By Mouth Once Daily 10)  Epipen 0.3 Mg/0.69ml (1:1000)  Devi (Epinephrine Hcl (Anaphylaxis)) .... Uad 11)  Hydrocodone-Acetaminophen 5-500 Mg  Tabs (Hydrocodone-Acetaminophen) .... One Tab By Mouth Three Times A Day 12)  Alprazolam 1 Mg Tabs (Alprazolam) .... One Tab By Mouth Bid 13)  Crestor 40 Mg Tabs (Rosuvastatin Calcium) .... One Tab By Mouth Qhs  Allergies (verified): 1)  ! Codeine  Review of Systems      See HPI Eyes:  Denies blurring. Heme:  Denies abnormal bruising and bleeding. Allergy:  Complains of hives or rash.  Physical Exam  General:  alert, well-hydrated, and overweight-appearing. HEENT: No facial asymmetry,  EOMI, No sinus tenderness, TM's Clear, oropharynx  pink and moist.   Chest: Clear to auscultation bilaterally. Decrfeased air entry bilaterally CVS: S1, S2, No murmurs, No S3.   Abd: Soft, Nontender.  MS: markedly reduced ROM spine , adequate hips, shoulders and knees.  Ext: No edema.   CNS: CN 2-12 intactreduced, power tone and sensation in right lower extremity   Skin: Intact, no visible lesions or rashes.  Psych: Good eye contact, normal affect.  Memory intact, not anxious or depressed appearing.      Impression & Recommendations:  Problem #  1:  HYPERTENSION (ICD-401.9) Assessment Unchanged  Her updated medication list for this problem includes:    Cardizem La 120 Mg Tb24 (Diltiazem hcl coated beads) ..... One tab by mouth once daily    Furosemide 20 Mg Tabs (Furosemide) ..... One tab by mouth two times a day    Lisinopril 10 Mg Tabs (Lisinopril) ..... One tab by mouth once daily  Orders: T-Basic Metabolic Panel [id number])  BP today: 114/78 Prior BP: 112/74 (04/04/2009)  Labs Reviewed: K+: 4.7 (02/25/2009) Creat: : 1.05 (02/25/2009)   Chol: 124 (02/25/2009)   HDL: 44 (02/25/2009)   LDL: 34 (02/25/2009)   TG: 231 (02/25/2009)  Problem #  2:  HYPERLIPIDEMIA (ICD-272.4) Assessment: Comment Only  Her updated medication list for this problem includes:    Crestor 40 Mg Tabs (Rosuvastatin calcium) ..... One tab by mouth qhs  Labs Reviewed: SGOT: 14 (02/25/2009)   SGPT: 14 (02/25/2009)   HDL:44 (02/25/2009), 59 (09/14/2008)  LDL:34 (02/25/2009), 52 (09/14/2008)  Chol:124 (02/25/2009), 144 (09/14/2008)  Trig:231 (02/25/2009), 165 (09/14/2008)  Problem # 3:  GERD (ICD-530.81) Assessment: Deteriorated  Her updated medication list for this problem includes:    Nexium 40 Mg Cpdr (Esomeprazole magnesium) ..... One cap by mouth once daily    Omeprazole 40 Mg Cpdr (Omeprazole) .Marland Kitchen... Take 1 capsule by mouth once a day  Complete Medication List: 1)  Zanaflex 4 Mg Tabs (Tizanidine hcl) .... One tab by mouth three times a day 2)  Klor-con M20 20 Meq Tbcr (Potassium chloride crys cr) .... One tab by mouth two times a day 3)  Tramadol Hcl 50 Mg Tabs (Tramadol hcl) .... Two tabs by mouth two times a day 4)  Cardizem La 120 Mg Tb24 (Diltiazem hcl coated beads) .... One tab by mouth once daily 5)  Furosemide 20 Mg Tabs (Furosemide) .... One tab by mouth two times a day 6)  Lisinopril 10 Mg Tabs (Lisinopril) .... One tab by mouth once daily 7)  Celebrex 200 Mg Caps (Celecoxib) .... One tab by mouth bid 8)  Aspirin 81 Mg Tabs (Aspirin) .... One tab by mouth once daily 9)  Nexium 40 Mg Cpdr (Esomeprazole magnesium) .... One cap by mouth once daily 10)  Epipen 0.3 Mg/0.95ml (1:1000) Devi (Epinephrine hcl (anaphylaxis)) .... Uad 11)  Hydrocodone-acetaminophen 5-500 Mg Tabs (Hydrocodone-acetaminophen) .... One tab by mouth three times a day 12)  Alprazolam 1 Mg Tabs (Alprazolam) .... One tab by mouth bid 13)  Crestor 40 Mg Tabs (Rosuvastatin calcium) .... One tab by mouth qhs 14)  Tussionex Pennkinetic Er 8-10 Mg/72ml Lqcr (Chlorpheniramine-hydrocodone) .Marland Kitchen.. 1 tsp q 12 hrs as needed 15)  Omeprazole 40 Mg Cpdr (Omeprazole) .... Take 1 capsule by  mouth once a day  Other Orders: T-Hepatic Function [id number]) T-Lipid Profile [id number]) T-CBC w/Diff [id number]) T-TSH [id number]) T-Vitamin D (25-Hydroxy) [phone]) Radiology Referral (Radiology)  Patient Instructions: 1)  Please schedule a follow-up appointment in 4.5 months. 2)  It is important that you exercise regularly at least 20 minutes 5 times a week. If you develop chest pain, have severe difficulty breathing, or feel very tired , stop exercising immediately and seek medical attention. 3)  You need to lose weight. Consider a lower calorie diet and regular exercise. CONGRATS  on your 10 pound weight loss, pls keep ot up  4)  No med changes at this time 5)  nO mORE cIGARETTE pURCHASES 9we already said you do not beg) 6)  mamo to be scheduled at checkout 7)  BMP prior to  visit, ICD-9: 8)  Hepatic Panel prior to visit, ICD-9: 9)  Lipid Panel prior to visit, ICD-9: 10)  TSH prior to visit, ICD-9: 11)  CBC w/ Diff prior to visit, ICD-9: 12)  vit D level Prescriptions: OMEPRAZOLE 40 MG CPDR (OMEPRAZOLE) Take 1 capsule by mouth once a day  #30 x 4   Entered and Authorized by:   Syliva Overman MD   Signed by:   Syliva Overman MD on 09/12/2009   Method used:   Electronically to        [facility] (retail)       [address]       [place], [place]  [address]       Ph: [phone]       Fax: [phone]   RxID:   [id number] Sandria Senter ER 8-10 MG/5ML LQCR (CHLORPHENIRAMINE-HYDROCODONE) 1 tsp q 12 hrs as needed  #300cc x 1   Entered by:   Everitt Amber   Authorized by:   Syliva Overman MD   Signed by:   Everitt Amber on 09/08/2009   Method used:   Printed then faxed to ...       [facility] (retail)       [address]       [place], [place]  [address]       Ph: [phone]       Fax: [phone]   RxID:   [id number] HYDROCODONE-ACETAMINOPHEN 5-500 MG  TABS  (HYDROCODONE-ACETAMINOPHEN) one tab by mouth three times a day  #90 x 3   Entered by:   Everitt Amber   Authorized by:   Syliva Overman MD   Signed by:   Everitt Amber on 09/08/2009   Method used:   Printed then faxed to ...       [facility] [address] (retail)       [address]       [place], [place]  [address]       Ph: [phone]       Fax: [phone]   RxID:   [id number] ALPRAZOLAM 1 MG TABS (ALPRAZOLAM) ONE TAB by mouth BID  #60 x 3   Entered by:   Everitt Amber   Authorized by:   Syliva Overman MD   Signed by:   Everitt Amber on 09/08/2009   Method used:   Printed then faxed to ...       [facility] [address] (retail)       [address]       [place], [place]  [address]       Ph: [phone]       Fax: [phone]   RxID:   [id number] CRESTOR 40 MG TABS (ROSUVASTATIN CALCIUM) ONE TAB by mouth QHS  #30 Tablet x 3   Entered by:   Everitt Amber   Authorized by:   Syliva Overman MD   Signed by:   Everitt Amber on 09/08/2009   Method used:   Printed then faxed to ...       [facility] (retail)       [address]       [place], [place]  [address]       Ph: [phone]  Fax: [phone]   RxID:   [id number] TRAMADOL HCL 50 MG  TABS (TRAMADOL HCL) two tabs by mouth two times a day  #120 x 3   Entered by:   Everitt Amber   Authorized by:   Syliva Overman MD   Signed by:   Everitt Amber on 09/08/2009   Method used:   Printed then faxed to ...       [facility]  [address] (retail)       [address]       [place], [place]  [address]       Ph: [phone]       Fax: [phone]   RxID:   [id number] NEXIUM 40 MG  CPDR (ESOMEPRAZOLE MAGNESIUM) one cap by mouth once daily  #30 x 3   Entered by:   Everitt Amber   Authorized by:   Syliva Overman MD   Signed by:   Everitt Amber on 09/08/2009   Method used:   Printed then faxed to ...       [facility] [address] (retail)       [address]       [place], [place]  [address]       Ph: [phone]       Fax: [phone]   RxID:   [id number] LISINOPRIL 10 MG  TABS (LISINOPRIL) one tab by mouth once daily  #30 x 3   Entered by:   Everitt Amber   Authorized by:   Syliva Overman MD   Signed by:   Everitt Amber on 09/08/2009   Method used:   Printed then faxed to ...       [facility]  [address] (retail)       [address]       [place], [place]  [address]       Ph: [phone]       Fax: [phone]   RxID:   [id number] FUROSEMIDE 20 MG  TABS (FUROSEMIDE) one tab by mouth two times a day  #60 Tablet x 3   Entered by:   Everitt Amber   Authorized by:   Syliva Overman MD   Signed by:   Everitt Amber on 09/08/2009   Method used:   Printed then faxed to ...       [facility]  [address] (retail)       [address]       [place], [place]  [address]       Ph: [phone]       Fax: [phone]   RxID:   [id number] CARDIZEM LA 120 MG  TB24 (DILTIAZEM HCL COATED BEADS) one tab by mouth once daily  #30.0 Each x 3   Entered by:   Everitt Amber   Authorized by:   Syliva Overman MD   Signed by:   Everitt Amber on 09/08/2009   Method used:   Printed then faxed to ...       [facility] (retail)       [address]       [place], [place]  [address]       Ph: [phone]       Fax: [phone]   RxID:   [id number] ZANAFLEX 4 MG  TABS (TIZANIDINE HCL) one tab by mouth three times a day  #90 x 3   Entered by:   Everitt Amber   Authorized by:   Syliva Overman MD   Signed by:   Everitt Amber on 09/08/2009   Method used:   Printed then faxed to ...       [facility] (retail)       [address]       [place], [place]  [address]       Ph: [phone]       Fax: [phone]   RxID:   [id number]

## 2010-09-14 NOTE — Progress Notes (Signed)
Summary: surgery went well  Phone Note Call from Patient   Summary of Call: she has had her surgery god had his hand there and it was ben.  Initial call taken by: Lind Guest,  April 06, 2010 12:51 PM  Follow-up for Phone Call        happy to know andthankful also Follow-up by: Syliva Overman MD,  April 06, 2010 5:10 PM

## 2010-09-14 NOTE — Assessment & Plan Note (Signed)
Summary: office visit   Vital Signs:  Patient profile:   66 year old female Menstrual status:  hysterectomy Height:      64 inches Weight:      202.25 pounds BMI:     34.84 O2 Sat:      90 % on Room air Pulse rate:   112 / minute Pulse rhythm:   regular Resp:     16 per minute BP sitting:   102 / 62  (left arm)  Vitals Entered By: Mauricia Area, CMA  Nutrition Counseling: Patient's BMI is greater than 25 and therefore counseled on weight management options.  O2 Flow:  Room air CC: follow up   CC:  follow up.  History of Present Illness: Reports  thatshe has been doing fairly well. She is distressed over the fact that she has resumed poor eating habits with excessive weight gain, she intends to reverse this however.She denies current nicotine use. She is thankful that her chest surgery went well inj the Summer, and she had a benign growth of the thymus Denies recent fever or chills. Denies sinus pressure, nasal congestion , ear pain or sore throat. Denies chest congestion, or cough productive of sputum. Denies chest pain, palpitations, PND, orthopnea or leg swelling. Denies abdominal pain, nausea, vomitting, diarrhea or constipation. Denies change in bowel movements or bloody stool. Denies dysuria , frequency, incontinence or hesitancy. Chronivc back pain persits and the plan is to have surgery some time next year. Denies headaches, vertigo, seizures. Denies depression, anxiety or insomnia. Denies  rash, lesions, or itch.     Allergies (verified): 1)  ! Codeine  Past History:  Past medical, surgical, family and social histories (including risk factors) reviewed, and no changes noted (except as noted below).  Past Medical History: Current Problems:  NICOTINE ADDICTION (ICD-305.1) quit 8/12/ 2011 GERD (ICD-530.81) ANXIETY (ICD-300.00) BACK PAIN, CHRONIC (ICD-724.5) OBESITY (ICD-278.00) HYPERLIPIDEMIA (ICD-272.4) hairline fracture left ankle Jan 06, 2010 dr  Farris Has is treating .  Past Surgical History: Back surgery (1999) Total hysterectomy and bilateral salpinggo-oophorectomy (1984) Cholecystectomy (2001) Left inguinal herniorrhaphy(1980) Tonsillectomy Incisional hrnia repair January 5,2010 removal of thmoma 03/27/2010 Dr Edwyna Shell  Family History: Reviewed history from 11/14/2007 and no changes required. Mom deceased enlarged heart dz,dm,htn Dad deceased throat cancer, Throat cancer, HTN,CAD Sister 3 DM 2,severe asthma, Brother gerd CVA  Social History: Reviewed history from 11/14/2007 and no changes required. Employed Married 2 still borns  Current Smoker, quit 03/2010 Alcohol use-no Drug use-no  Review of Systems      See HPI General:  Complains of fatigue. Eyes:  Complains of vision loss-both eyes; denies blurring and discharge; wears corrective lenses. Resp:  Complains of shortness of breath; increased abdominal girth with fatigue. MS:  Complains of low back pain, mid back pain, and stiffness. Psych:  Denies anxiety and depression. Endo:  Denies excessive hunger, excessive thirst, excessive urination, and polyuria. Heme:  Denies abnormal bruising and bleeding. Allergy:  Complains of seasonal allergies; denies hives or rash and itching eyes.  Physical Exam  General:  Well-developedobese,in no acute distress; alert,appropriate and cooperative throughout examination HEENT: No facial asymmetry,  EOMI, No sinus tenderness, TM's Clear, oropharynx  pink and moist.   Chest: Clear to auscultation bilaterally. Decreased air entry bilaterally CVS: S1, S2, No murmurs, No S3.   Abd: Soft, Nontender.Obese MS: decreased  ROM spine, hips, shoulders and knees.  Ext: No edema.   CNS: CN 2-12 intact, power tone and sensation normal throughout.   Skin: Intact,surgical  scaron anterior chest well healed Psych: Good eye contact, normal affect.  Memory intact, not anxious or depressed appearing.    Impression &  Recommendations:  Problem # 1:  BACK PAIN WITH RADICULOPATHY (ICD-729.2) Assessment Unchanged  Problem # 2:  HYPERTENSION (ICD-401.9) Assessment: Unchanged  Her updated medication list for this problem includes:    Cardizem La 120 Mg Tb24 (Diltiazem hcl coated beads) ..... One tab by mouth once daily    Furosemide 20 Mg Tabs (Furosemide) ..... One tab by mouth two times a day    Lisinopril 10 Mg Tabs (Lisinopril) ..... One tab by mouth once daily  Orders: T-Basic Metabolic Panel [id number] [phone])  BP today: 102/62 Prior BP: 110/68 (01/18/2010)  Labs Reviewed: K+: 4.6 (09/12/2009) Creat: : 0.81 (09/12/2009)   Chol: 132 (01/24/2010)   HDL: 42 (01/24/2010)   LDL: 31 (01/24/2010)   TG: 296 (01/24/2010)  Problem # 3:  ANXIETY (ICD-300.00) Assessment: Improved  Her updated medication list for this problem includes:    Alprazolam 1 Mg Tabs (Alprazolam) ..... One tab by mouth two times a day.  Problem # 4:  HYPERLIPIDEMIA (ICD-272.4) Assessment: Comment Only  Her updated medication list for this problem includes:    Crestor 40 Mg Tabs (Rosuvastatin calcium) ..... One tab by mouth at bedtime. Low fat dietdiscussed and encouraged  Orders: T-Lipid Profile [id number]) T-Hepatic Function [id number])  Labs Reviewed: SGOT: 17 (01/24/2010)   SGPT: 16 (01/24/2010)   HDL:42 (01/24/2010), 49 (09/12/2009)  LDL:31 (01/24/2010), 38 (09/12/2009)  Chol:132 (01/24/2010), 124 (09/12/2009)  Trig:296 (01/24/2010), 186 (09/12/2009)  Complete Medication List: 1)  Zanaflex 4 Mg Tabs (Tizanidine hcl) .... One tab by mouth three times a day 2)  Klor-con M20 20 Meq Tbcr (Potassium chloride crys cr) .... One tab by mouth two times a day 3)  Tramadol Hcl 50 Mg Tabs (Tramadol hcl) .... Two tabs by mouth two times a day 4)  Cardizem La 120 Mg Tb24 (Diltiazem hcl coated beads) .... One tab by mouth once daily 5)  Furosemide 20 Mg Tabs (Furosemide) .... One tab by mouth two times a day 6)  Lisinopril 10 Mg  Tabs (Lisinopril) .... One tab by mouth once daily 7)  Celebrex 200 Mg Caps (Celecoxib) .... One tab by mouth two times a day 8)  Aspirin 81 Mg Tabs (Aspirin) .... One tab by mouth once daily 9)  Omeprazole 40 Mg Cpdr (Omeprazole) .... Take 1 tablet by mouth once a day 10)  Epipen 0.3 Mg/0.34ml (1:1000) Devi (Epinephrine hcl (anaphylaxis)) .... Use as directed 11)  Hydrocodone-acetaminophen 5-500 Mg Tabs (Hydrocodone-acetaminophen) .... One tab by mouth three times a day 12)  Alprazolam 1 Mg Tabs (Alprazolam) .... One tab by mouth two times a day. 13)  Crestor 40 Mg Tabs (Rosuvastatin calcium) .... One tab by mouth at bedtime. 14)  Omeprazole 40 Mg Cpdr (Omeprazole) .... Take 1 capsule by mouth once a day 15)  Vitamin D (ergocalciferol) 50000 Unit Caps (Ergocalciferol) .... One cap by mouth once weekly  Other Orders: T-CBC w/Diff ([id number]) T-TSH [id number])  Patient Instructions: 1)  cPE  in 4 4 months 2)  Patient advised to ensure adequate intake of fruit and vegetables daily, at least 3 servings of each, as well as water intake of at least 48 ounces daily, adregular exercise. 3)  OTC stool softeners are helpful for daily use , up to 4 daily eg colace. 4)  Fiber intake daily is needed, in the form of Bran, Shredded Wheat or senekot. 5)  You  need to lose weight. Consider a lower calorie diet and regular exercise.  6)  We will provide 1200 and 1500 cal diet sheets will be providied 7)  It is important that you exercise regularly at least 30 minutes 5 times a week. If you develop chest pain, have severe difficulty breathing, or feel very tired , stop exercising immediately and seek medical attention. 8)  The medication list was reviewed and reconciled..All changed/newly prescribed medications were explained. A complete medication list was provided to the patient/caregiver.   Appended Document: office visit   Orders Added: 1)  Tdap => 8yrs IM [90715] 2)  Admin 1st Vaccine [90471] 3)   Admin 1st Vaccine [facility]) [id number] 4)  Influenza Vaccine NON MCR [00028]    Tetanus/Td Vaccine    Vaccine Type: Tdap    Site: left deltoid    Mfr: GlaxoSmithKline    Dose: 0.5 ml    Route: IM    Given by: Adella Hare LPN    Exp. Date: 06/01/2012    Lot #: [id number]    VIS given: 06/30/08 version given May 24, 2010.  Influenza Vaccine    Vaccine Type: Fluvax Non-MCR    Site: right deltoid    Mfr: novartis    Dose: 0.5 ml    Route: IM    Given by: Adella Hare LPN    Exp. Date: 12/2010    Lot #: [id number]    VIS given: 03/07/10 version given May 24, 2010.

## 2010-09-14 NOTE — Medication Information (Signed)
Summary: Tax adviser   Imported By: Lind Guest 12/09/2009 15:30:15  _____________________________________________________________________  External Attachment:    Type:   Image     Comment:   External Document

## 2010-09-14 NOTE — Progress Notes (Signed)
Summary: [facility]   Imported By: Lind Guest 02/21/2010 10:29:44  _____________________________________________________________________  External Attachment:    Type:   Image     Comment:   External Document

## 2010-09-14 NOTE — Letter (Signed)
Summary: xray  xray   Imported By: Curtis Sites 12/28/2009 13:15:27  _____________________________________________________________________  External Attachment:    Type:   Image     Comment:   External Document

## 2010-09-14 NOTE — Progress Notes (Signed)
Summary: [facility]  [facility]   Imported By: Lind Guest 09/06/2009 10:53:37  _____________________________________________________________________  External Attachment:    Type:   Image     Comment:   External Document

## 2010-09-14 NOTE — Letter (Signed)
Summary: DR, Farris Has & DR. Channing Mutters  DR, [facility] & DR. Channing Mutters   Imported By: Lind Guest 01/18/2010 14:02:47  _____________________________________________________________________  External Attachment:    Type:   Image     Comment:   External Document

## 2010-09-14 NOTE — Letter (Signed)
Summary: demographic  demographic   Imported By: Curtis Sites 12/28/2009 13:12:15  _____________________________________________________________________  External Attachment:    Type:   Image     Comment:   External Document

## 2010-09-14 NOTE — Progress Notes (Signed)
Summary: medication refills  Phone Note Call from Patient   Summary of Call: patient states she has several prescriptions that have no refills on them, she states in the past she didn't have to call us for her refills, but now it seems she has to call all the time.  She uses [facility] in [place], she needs klor-con, vit. D 2, Alprcizaolam, Laxis called in.  She states that her crestor and omerazole doesn't have any more refills.  Please advise Initial call taken by: Curtis Sites,  June 19, 2010 9:21 AM  Follow-up for Phone Call        all meds sent Follow-up by: Adella Hare LPN,  June 19, 2010 9:55 AM    Prescriptions: ALPRAZOLAM 1 MG TABS (ALPRAZOLAM) ONE TAB by mouth two times a day.  #60 x 3   Entered by:   Adella Hare LPN   Authorized by:   Syliva Overman MD   Signed by:   Adella Hare LPN on 81/19/1478   Method used:   Printed then faxed to ...       [facility]       [address]       [place], [place]  [address]       Ph: [phone]       Fax: [phone]   RxID:   [id number] VITAMIN D (ERGOCALCIFEROL) 50000 UNIT CAPS (ERGOCALCIFEROL) one cap by mouth once weekly  #4 x 3   Entered by:   Adella Hare LPN   Authorized by:   Syliva Overman MD   Signed by:   Adella Hare LPN on 34/74/2595   Method used:   Electronically to        [facility] (retail)       [address]       [place], [place]  [address]       Ph: [phone]       Fax: [phone]   RxID:   [id number] CELEBREX 200 MG  CAPS (CELECOXIB) one tab by mouth two times a day  #60 x 3   Entered by:   Adella Hare LPN   Authorized by:   Syliva Overman MD   Signed by:   Adella Hare LPN on 42/70/6237   Method used:   Electronically to        [facility] (retail)       [address]       [place], [place]  [address]       Ph: [phone]       Fax: [phone]   RxID:    [id number] ASPIRIN 81 MG  TABS (ASPIRIN) one tab by mouth once daily  #30 x 3   Entered by:   Adella Hare LPN   Authorized by:   Syliva Overman MD   Signed by:   Adella Hare LPN on 37/16/9678   Method used:   Electronically to        [facility] (retail)       [address]       [place], [place]  [address]       Ph: [phone]       Fax: [phone]   RxID:   [id number] LISINOPRIL 10 MG  TABS (  LISINOPRIL) one tab by mouth once daily  #30 Tablet x 3   Entered by:   Adella Hare LPN   Authorized by:   Syliva Overman MD   Signed by:   Adella Hare LPN on 04/54/0981   Method used:   Electronically to        [facility] (retail)       [address]       [place], [place]  [address]       Ph: [phone]       Fax: [phone]   RxID:   [id number] FUROSEMIDE 20 MG  TABS (FUROSEMIDE) one tab by mouth two times a day  #60 Tablet x 3   Entered by:   Adella Hare LPN   Authorized by:   Syliva Overman MD   Signed by:   Adella Hare LPN on 36/64/4034   Method used:   Electronically to        [facility] (retail)       [address]       [place], [place]  [address]       Ph: [phone]       Fax: [phone]   RxID:   [id number] CARDIZEM LA 120 MG  TB24 (DILTIAZEM HCL COATED BEADS) one tab by mouth once daily  #30 Capsule x 3   Entered by:   Adella Hare LPN   Authorized by:   Syliva Overman MD   Signed by:   Adella Hare LPN on 32/20/2542   Method used:   Electronically to        [facility] (retail)       [address]       [place], [place]  [address]       Ph: [phone]       Fax: [phone]   RxID:   [id number] TRAMADOL HCL 50 MG  TABS (TRAMADOL HCL) two tabs by mouth two times a day  #120 Tablet x 3   Entered by:   Adella Hare LPN   Authorized by:   Syliva Overman MD   Signed by:   Adella Hare  LPN on 81/82/9937   Method used:   Electronically to        [facility] (retail)       [address]       [place], [place]  [address]       Ph: [phone]       Fax: [phone]   RxID:   [id number] KLOR-CON M20 20 MEQ  TBCR (POTASSIUM CHLORIDE CRYS CR) one tab by mouth two times a day  #60 Tablet x 3   Entered by:   Adella Hare LPN   Authorized by:   Syliva Overman MD   Signed by:   Adella Hare LPN on 67/61/9509   Method used:   Electronically to        [facility] (retail)       [address]       [place], [place]  [address]       Ph: [phone]       Fax: [phone]   RxID:   [id number] ZANAFLEX 4 MG  TABS (TIZANIDINE HCL) one  tab by mouth three times a day  #90 Tablet x 3   Entered by:   Adella Hare LPN   Authorized by:   Syliva Overman MD   Signed by:   Adella Hare LPN on 16/05/9603   Method used:   Electronically to        [facility] (retail)       [address]       [place], [place]  [address]       Ph: [phone]       Fax: [phone]   RxID:   [id number]

## 2010-09-14 NOTE — Progress Notes (Signed)
Summary: lisinopril 10mg   Phone Note Call from Patient   Summary of Call: patient states she needs her lisinopril 10 mg I advised her that she is to call the pharmacy but she says she did that before and it didn't work. Initial call taken by: Curtis Sites,  August 11, 2010 8:12 AM    Prescriptions: LISINOPRIL 10 MG  TABS (LISINOPRIL) one tab by mouth once daily  #30 Tablet x 1   Entered by:   Adella Hare LPN   Authorized by:   Syliva Overman MD   Signed by:   Adella Hare LPN on 16/05/9603   Method used:   Electronically to        [facility] (retail)       [address]       [place], [place]  [address]       Ph: [phone]       Fax: [phone]   RxID:   [id number]

## 2010-09-14 NOTE — Letter (Signed)
Summary: consults  consults   Imported By: Curtis Sites 12/28/2009 13:15:49  _____________________________________________________________________  External Attachment:    Type:   Image     Comment:   External Document

## 2010-09-14 NOTE — Progress Notes (Signed)
Summary: med  Phone Note Call from Patient   Summary of Call: her insurance will not cover nexium needs something else asap has not had any since yesterday [facility] [place] Initial call taken by: Lind Guest,  September 12, 2009 4:16 PM  Follow-up for Phone Call        omeprazolw sent in Follow-up by: Syliva Overman MD,  September 12, 2009 5:40 PM

## 2010-09-14 NOTE — Progress Notes (Signed)
Summary: [facility]  [facility]   Imported By: Lind Guest 01/18/2010 14:01:53  _____________________________________________________________________  External Attachment:    Type:   Image     Comment:   External Document

## 2010-09-14 NOTE — Letter (Signed)
[facility]   Imported By: Lind Guest 05/09/2010 09:03:12  _____________________________________________________________________  External Attachment:    Type:   Image     Comment:   External Document

## 2010-09-14 NOTE — Letter (Signed)
Summary: xrays  xrays   Imported By: Curtis Sites 12/28/2009 13:15:02  _____________________________________________________________________  External Attachment:    Type:   Image     Comment:   External Document

## 2010-09-14 NOTE — Letter (Signed)
Summary: misc.  misc.   Imported By: Curtis Sites 12/28/2009 13:13:55  _____________________________________________________________________  External Attachment:    Type:   Image     Comment:   External Document

## 2010-09-14 NOTE — Letter (Signed)
Summary: progress notes  progress notes   Imported By: Curtis Sites 12/28/2009 13:14:42  _____________________________________________________________________  External Attachment:    Type:   Image     Comment:   External Document

## 2010-09-14 NOTE — Letter (Signed)
Summary: history and physical  history and physical   Imported By: Curtis Sites 12/28/2009 13:12:39  _____________________________________________________________________  External Attachment:    Type:   Image     Comment:   External Document

## 2010-09-14 NOTE — Letter (Signed)
Summary: consults  consults   Imported By: Curtis Sites 12/28/2009 13:11:51  _____________________________________________________________________  External Attachment:    Type:   Image     Comment:   External Document

## 2010-09-14 NOTE — Assessment & Plan Note (Signed)
Summary: swollen ankle- room 2   Vital Signs:  Patient profile:   65 year old female Menstrual status:  hysterectomy Height:      64 inches Weight:      197.25 pounds BMI:     33.98 O2 Sat:      99 % on Room air Pulse rate:   90 / minute Resp:     16 per minute BP sitting:   110 / 60  (left arm)  Vitals Entered By: Adella Hare LPN (December 09, 2009 11:05 AM) CC: swollen left ankle Is Patient Diabetic? No Pain Assessment Patient in pain? yes     Location: left ankle Intensity: 4 Type: aching Onset of pain  Chronic Comments patient did not bring medications with him today   CC:  swollen left ankle.  History of Present Illness: Pt is here today with  c/o Lt ankle pain & swelling.  She fell yesterday in her kitchen & injured the ankle.  No head trauma, LOC, etc.  She is scheduled for back surgery.  Her legs give out on her & she falls.  Pain  is worse with walking.  Pain & swelling started soon after the fall.    Allergies (verified): 1)  ! Codeine  Past History:  Past medical history reviewed for relevance to current acute and chronic problems.  Past Medical History: Reviewed history from 11/14/2007 and no changes required. Current Problems:  NICOTINE ADDICTION (ICD-305.1) GERD (ICD-530.81) ANXIETY (ICD-300.00) BACK PAIN, CHRONIC (ICD-724.5) OBESITY (ICD-278.00) HYPERLIPIDEMIA (ICD-272.4) .  Review of Systems MS:  Complains of joint pain, joint swelling, and loss of strength; denies joint redness.  Physical Exam  General:  Well-developed,well-nourished,in no acute distress; alert,appropriate and cooperative throughout examination Head:  Normocephalic and atraumatic without obvious abnormalities. No apparent alopecia or balding. Lungs:  Normal respiratory effort, chest expands symmetrically. Lungs are clear to auscultation, no crackles or wheezes. Heart:  Normal rate and regular rhythm. S1 and S2 normal without gallop, murmur, click, rub or other extra  sounds. Msk:  Lt ankle:  FROM. Mild edema of the lateral ankle noted.  No ligament instablitiy & neg drawer.  TTP distal fibula  and lateral malleolus.  Remainder of ankle & foot nontender to palp.  Pulses:  L posterior tibial normal and L dorsalis pedis normal.   Neurologic:  alert & oriented X3 and sensation intact to light touch.   Skin:  color normal and no ecchymoses.   Psych:  Cognition and judgment appear intact. Alert and cooperative with normal attention span and concentration. No apparent delusions, illusions, hallucinations   Impression & Recommendations:  Problem # 1:  ANKLE PAIN, LEFT (ICD-719.47) Assessment New  Discussed with pt most likely sprained, but need to r/o fx.   She will go get x-ray done as soon as leaves office. Rx written for air cast.   Ice & elevate. Pt is already taking NSAID & pain meds.  Orders: Miscellaneous Other Radiology (Misc Other Rad)  Complete Medication List: 1)  Zanaflex 4 Mg Tabs (Tizanidine hcl) .... One tab by mouth three times a day 2)  Klor-con M20 20 Meq Tbcr (Potassium chloride crys cr) .... One tab by mouth two times a day 3)  Tramadol Hcl 50 Mg Tabs (Tramadol hcl) .... Two tabs by mouth two times a day 4)  Cardizem La 120 Mg Tb24 (Diltiazem hcl coated beads) .... One tab by mouth once daily 5)  Furosemide 20 Mg Tabs (Furosemide) .... One tab by mouth two times  a day 6)  Lisinopril 10 Mg Tabs (Lisinopril) .... One tab by mouth once daily 7)  Celebrex 200 Mg Caps (Celecoxib) .... One tab by mouth bid 8)  Aspirin 81 Mg Tabs (Aspirin) .... One tab by mouth once daily 9)  Nexium 40 Mg Cpdr (Esomeprazole magnesium) .... One cap by mouth once daily 10)  Epipen 0.3 Mg/0.27ml (1:1000) Devi (Epinephrine hcl (anaphylaxis)) .... Uad 11)  Hydrocodone-acetaminophen 5-500 Mg Tabs (Hydrocodone-acetaminophen) .... One tab by mouth three times a day 12)  Alprazolam 1 Mg Tabs (Alprazolam) .... One tab by mouth bid 13)  Crestor 40 Mg Tabs  (Rosuvastatin calcium) .... One tab by mouth qhs 14)  Tussionex Pennkinetic Er 8-10 Mg/8ml Lqcr (Chlorpheniramine-hydrocodone) .Marland Kitchen.. 1 tsp q 12 hrs as needed 15)  Omeprazole 40 Mg Cpdr (Omeprazole) .... Take 1 capsule by mouth once a day 16)  Vitamin D (ergocalciferol) 50000 Unit Caps (Ergocalciferol) .... One cap by mouth q week  Patient Instructions: 1)  Please schedule a follow-up appointment as needed. 2)  Have your xray done after you leave the office. 3)  Wear splint on Lt ankle as discussed. 4)  Ice & elevate Lt leg. 5)  Continue your current medications.

## 2010-09-14 NOTE — Progress Notes (Signed)
Summary: MEDICINE  Phone Note Call from Patient   Summary of Call: NEEDS ALL HER MEDICINES SEND TO [facility]  FORGOT TO TELL YOU THE OTHER DAY Initial call taken by: Lind Guest,  May 31, 2010 9:18 AM    Prescriptions: CRESTOR 40 MG TABS (ROSUVASTATIN CALCIUM) ONE TAB by mouth at bedtime.  #30 x 3   Entered by:   Adella Hare LPN   Authorized by:   Syliva Overman MD   Signed by:   Adella Hare LPN on 16/60/6301   Method used:   Electronically to        [facility] (retail)       [address]       [place], [place]  [address]       Ph: [phone]       Fax: [phone]   RxID:   [id number] OMEPRAZOLE 40 MG CPDR (OMEPRAZOLE) Take 1 capsule by mouth once a day  #30 Capsule x 3   Entered by:   Adella Hare LPN   Authorized by:   Syliva Overman MD   Signed by:   Adella Hare LPN on 02/06/9484   Method used:   Electronically to        [facility] (retail)       [address]       [place], [place]  [address]       Ph: [phone]       Fax: [phone]   RxID:   [id number] LISINOPRIL 10 MG  TABS (LISINOPRIL) one tab by mouth once daily  #30 Tablet x 3   Entered by:   Adella Hare LPN   Authorized by:   Syliva Overman MD   Signed by:   Adella Hare LPN on 78/24/2353   Method used:   Electronically to        [facility] (retail)       [address]       [place], [place]  [address]       Ph: [phone]       Fax: [phone]   RxID:   [id number] FUROSEMIDE 20 MG  TABS (FUROSEMIDE) one tab by mouth two times a day  #60 Tablet x 3   Entered by:   Adella Hare LPN   Authorized by:   Syliva Overman MD   Signed by:   Adella Hare LPN on 76/73/4193   Method used:   Electronically to        [facility] (retail)       [address]       [place], [place]  [address]       Ph: [phone]       Fax: [phone]  RxID:   [id number] CARDIZEM LA 120 MG  TB24 (DILTIAZEM HCL COATED BEADS) one tab by mouth once daily  #30 Capsule x 3   Entered by:   Adella Hare LPN   Authorized by:   Syliva Overman MD   Signed by:   Adella Hare LPN on 81/44/8185   Method used:   Electronically to        [facility] (retail)       [address]       [place], [place]  [address]       Ph: [phone]       Fax: [phone]   RxID:   [id number] KLOR-CON M20 20 MEQ  TBCR (POTASSIUM CHLORIDE CRYS CR) one tab by mouth two times a day  #60 Tablet x 3   Entered by:   Adella Hare LPN   Authorized by:   Syliva Overman MD   Signed by:   Adella Hare LPN on 40/05/2724   Method used:   Electronically to        [facility] (retail)       [address]       [place], [place]  [address]       Ph: [phone]       Fax: [phone]   RxID:   [id number] ZANAFLEX 4 MG  TABS (TIZANIDINE HCL) one tab by mouth three times a day  #90 Tablet x 3   Entered by:   Adella Hare LPN   Authorized by:   Syliva Overman MD   Signed by:   Adella Hare LPN on 09/32/3557   Method used:   Electronically to        [facility] (retail)       [address]       [place], [place]  [address]       Ph: [phone]       Fax: [phone]   RxID:   [id number]

## 2010-09-14 NOTE — Progress Notes (Signed)
Summary: xanax  Phone Note Call from Patient   Summary of Call: patient called in requesting you send over her xanax to pharmacy they come due on Sunday Initial call taken by: Shannon James,  June 30, 2010 10:26 AM    Prescriptions: ALPRAZOLAM 1 MG TABS (ALPRAZOLAM) ONE TAB by mouth two times a day.  #60 x 2   Entered by:   Jaime Boothe LPN   Authorized by:   Margaret Simpson MD   Signed by:   Jaime Boothe LPN on 06/30/2010   Method used:   Printed then faxed to ...       [facility] [address] (retail)       [address]       [place], [place]  [address]       Ph: [phone]       Fax: [phone]   RxID:   [id number]

## 2010-09-14 NOTE — Letter (Signed)
Summary: lab  lab   Imported By: Curtis Sites 12/28/2009 13:13:05  _____________________________________________________________________  External Attachment:    Type:   Image     Comment:   External Document

## 2010-09-14 NOTE — Letter (Signed)
Summary: phone notes  phone notes   Imported By: Curtis Sites 12/28/2009 13:14:16  _____________________________________________________________________  External Attachment:    Type:   Image     Comment:   External Document

## 2010-10-03 ENCOUNTER — Encounter: Payer: Self-pay | Admitting: Family Medicine

## 2010-10-03 ENCOUNTER — Other Ambulatory Visit (HOSPITAL_COMMUNITY)
Admission: RE | Admit: 2010-10-03 | Discharge: 2010-10-03 | Disposition: A | Discharge status: Home / Self Care | Payer: 59 | Source: Ambulatory Visit | Attending: Family Medicine | Admitting: Family Medicine

## 2010-10-03 ENCOUNTER — Encounter (INDEPENDENT_AMBULATORY_CARE_PROVIDER_SITE_OTHER): Payer: 59 | Admitting: Family Medicine

## 2010-10-03 ENCOUNTER — Other Ambulatory Visit: Payer: Self-pay | Admitting: Family Medicine

## 2010-10-03 DIAGNOSIS — R7301 Impaired fasting glucose: Secondary | ICD-10-CM | POA: Insufficient documentation

## 2010-10-03 DIAGNOSIS — Z01419 Encounter for gynecological examination (general) (routine) without abnormal findings: Secondary | ICD-10-CM | POA: Insufficient documentation

## 2010-10-03 DIAGNOSIS — Z2911 Encounter for prophylactic immunotherapy for respiratory syncytial virus (RSV): Secondary | ICD-10-CM

## 2010-10-03 DIAGNOSIS — Z124 Encounter for screening for malignant neoplasm of cervix: Secondary | ICD-10-CM

## 2010-10-03 DIAGNOSIS — Z23 Encounter for immunization: Secondary | ICD-10-CM

## 2010-10-03 DIAGNOSIS — Z0000 Encounter for general adult medical examination without abnormal findings: Secondary | ICD-10-CM

## 2010-10-03 DIAGNOSIS — Z1211 Encounter for screening for malignant neoplasm of colon: Secondary | ICD-10-CM

## 2010-10-03 LAB — CONVERTED CEMR LAB: OCCULT 1: NEGATIVE

## 2010-10-05 ENCOUNTER — Encounter: Payer: Self-pay | Admitting: Family Medicine

## 2010-10-10 ENCOUNTER — Telehealth: Payer: Self-pay | Admitting: Family Medicine

## 2010-10-10 NOTE — Letter (Signed)
Summary: Letter  Letter   Imported By: Lind Guest 10/06/2010 14:45:36  _____________________________________________________________________  External Attachment:    Type:   Image     Comment:   External Document

## 2010-10-10 NOTE — Assessment & Plan Note (Signed)
Summary: phy   Vital Signs:  Patient profile:   65 year old female Menstrual status:  hysterectomy Height:      64 inches Weight:      202 pounds BMI:     34.80 O2 Sat:      93 % Pulse rate:   98 / minute Pulse rhythm:   regular Resp:     16 per minute BP sitting:   122 / 68  (left arm) Cuff size:   large  Vitals Entered By: Everitt Amber LPN (October 03, 2010 8:15 AM)  Nutrition Counseling: Patient's BMI is greater than 25 and therefore counseled on weight management options. CC: CPE   CC:  CPE.  History of Present Illness: Reports  that she has been doing well. She is concerned about her ongoing nicotine use , and wants to quit. She continues with chronic back pain, and her main goal is weight loss with inc exercise as mainstay of mx, the surgical option is not a priority at thistime anymore. Denies recent fever or chills. Denies sinus pressure, nasal congestion , ear pain or sore throat. Denies chest congestion, or cough productive of sputum. Denies chest pain, palpitations, PND, orthopnea or leg swelling. Denies abdominal pain, nausea, vomitting, diarrhea or constipation. Denies change in bowel movements or bloody stool. Denies dysuria , frequency, incontinence or hesitancy.  Denies headaches, vertigo, seizures. Denies depression, anxiety or insomnia. Denies  rash, lesions, or itch.     Preventive Screening-Counseling & Management  Alcohol-Tobacco     Smoking Cessation Counseling: yes  Current Medications (verified): 1)  Zanaflex 4 Mg  Tabs (Tizanidine Hcl) .... One Tab By Mouth Three Times A Day 2)  Klor-Con M20 20 Meq  Tbcr (Potassium Chloride Crys Cr) .... One Tab By Mouth Two Times A Day 3)  Tramadol Hcl 50 Mg  Tabs (Tramadol Hcl) .... Two Tabs By Mouth Two Times A Day 4)  Cardizem La 120 Mg  Tb24 (Diltiazem Hcl Coated Beads) .... One Tab By Mouth Once Daily 5)  Furosemide 20 Mg  Tabs (Furosemide) .... One Tab By Mouth Two Times A Day 6)  Lisinopril 10 Mg   Tabs (Lisinopril) .... One Tab By Mouth Once Daily 7)  Celebrex 200 Mg  Caps (Celecoxib) .... One Tab By Mouth Two Times A Day 8)  Aspirin 81 Mg  Tabs (Aspirin) .... One Tab By Mouth Once Daily 9)  Epipen 0.3 Mg/0.10ml (1:1000)  Devi (Epinephrine Hcl (Anaphylaxis)) .... Use As Directed 10)  Hydrocodone-Acetaminophen 5-500 Mg  Tabs (Hydrocodone-Acetaminophen) .... One Tab By Mouth Three Times A Day 11)  Alprazolam 1 Mg Tabs (Alprazolam) .... One Tab By Mouth Two Times A Day. 12)  Crestor 40 Mg Tabs (Rosuvastatin Calcium) .... One Tab By Mouth At Bedtime. 13)  Omeprazole 40 Mg Cpdr (Omeprazole) .... Take 1 Capsule By Mouth Once A Day 14)  Vitamin D (Ergocalciferol) 50000 Unit Caps (Ergocalciferol) .... One Cap By Mouth Once Weekly  Allergies (verified): 1)  ! Codeine  Past History:  Past medical, surgical, family and social histories (including risk factors) reviewed for relevance to current acute and chronic problems.  Past Medical History: Reviewed history from 05/24/2010 and no changes required. Current Problems:  NICOTINE ADDICTION (ICD-305.1) quit 8/12/ 2011 GERD (ICD-530.81) ANXIETY (ICD-300.00) BACK PAIN, CHRONIC (ICD-724.5) OBESITY (ICD-278.00) HYPERLIPIDEMIA (ICD-272.4) hairline fracture left ankle Jan 06, 2010 dr Farris Has is treating .  Past Surgical History: Reviewed history from 05/24/2010 and no changes required. Back surgery (1999) Total hysterectomy and  bilateral salpinggo-oophorectomy (1984) Cholecystectomy (2001) Left inguinal herniorrhaphy(1980) Tonsillectomy Incisional hrnia repair January 5,2010 removal of thmoma 03/27/2010 Dr Edwyna Shell  Family History: Reviewed history from 11/14/2007 and no changes required. Mom deceased enlarged heart dz,dm,htn Dad deceased throat cancer, Throat cancer, HTN,CAD Sister 3 DM 2,severe asthma, Brother gerd CVA  Social History: Reviewed history from 05/24/2010 and no changes required. Employed Married 2 still  borns  Current Smoker, quit 03/2010 Alcohol use-no Drug use-no  Review of Systems      See HPI Eyes:  Complains of vision loss-both eyes; denies blurring, discharge, halos, and itching; wears corrective lenses. MS:  Complains of low back pain, mid back pain, and stiffness. Endo:  Denies cold intolerance, excessive hunger, excessive thirst, excessive urination, and heat intolerance. Heme:  Denies abnormal bruising and bleeding. Allergy:  Complains of seasonal allergies; denies hives or rash and itching eyes.  Physical Exam  General:  Well-developedobese,in no acute distress; alert,appropriate and cooperative throughout examination Head:  Normocephalic and atraumatic without obvious abnormalities. No apparent alopecia or balding. Eyes:  No corneal or conjunctival inflammation noted. EOMI. Perrla. Funduscopic exam benign, without hemorrhages, exudates or papilledema. Vision grossly normal. Ears:  External ear exam shows no significant lesions or deformities.  Otoscopic examination reveals clear canals, tympanic membranes are intact bilaterally without bulging, retraction, inflammation or discharge. Hearing is grossly normal bilaterally. Nose:  External nasal examination shows no deformity or inflammation. Nasal mucosa are pink and moist without lesions or exudates. Mouth:  no dental plaque, pharynx pink and moist, and fair dentition.   Neck:  No deformities, masses, or tenderness noted. Chest Wall:  No deformities, masses, or tenderness noted. Breasts:  No mass, nodules, thickening, tenderness, bulging, retraction, inflamation, nipple discharge or skin changes noted.   Lungs:  Normal respiratory effort, chest expands symmetrically. Lungs are clear to auscultation, no crackles or wheezes. Heart:  Normal rate and regular rhythm. S1 and S2 normal without gallop, murmur, click, rub or other extra sounds. Abdomen:  Bowel sounds positive,abdomen soft and non-tender without masses, organomegaly or  hernias noted. Rectal:  No external abnormalities noted. Normal sphincter tone. No rectal masses or tenderness. Genitalia:  normal introitus, no vaginal discharge, and no adnexal masses or tenderness.  Uterus absent Msk:  No deformity or scoliosis noted of thoracic or lumbar spine.   Pulses:  R and L carotid,radial,femoral,dorsalis pedis and posterior tibial pulses are full and equal bilaterally Extremities:  No clubbing, cyanosis, edema, or deformity noted with decreased ROM thoracolumbar spine, othewise full range of motion of l joints.   Neurologic:  No cranial nerve deficits noted. Station and gait are normal. Plantar reflexes are down-going bilaterally. DTRs are symmetrical throughout. Sensory, motor and coordinative functions appear intact. Skin:  Intact without suspicious lesions or rashes Cervical Nodes:  No lymphadenopathy noted Axillary Nodes:  No palpable lymphadenopathy Inguinal Nodes:  No significant adenopathy Psych:  Cognition and judgment appear intact. Alert and cooperative with normal attention span and concentration. No apparent delusions, illusions, hallucinations   Impression & Recommendations:  Problem # 1:  IMPAIRED FASTING GLUCOSE (ICD-790.21) Assessment Comment Only  Orders: T- Hemoglobin A1C ([phone]) Pt advised to reduce carbohydrate intake, espescially sweets, and to start regular physical activity, at least 30 minutes 5 days weekly, to enable weight loss, and reduce the risk of becoming diabetic   Problem # 2:  PHYSICAL EXAMINATION (ICD-V70.0) Assessment: Comment Only pap sent. Rectal reveals guaic neg stool, and no mass IMpt of seatbelt use, regular exercise and healthy diet stressed  Problem # 3:  BACK PAIN WITH RADICULOPATHY (ICD-729.2) Assessment: Unchanged conservative management  Problem # 4:  HYPERTENSION (ICD-401.9) Assessment: Unchanged  Her updated medication list for this problem includes:    Cardizem La 120 Mg Tb24 (Diltiazem hcl  coated beads) ..... One tab by mouth once daily    Furosemide 20 Mg Tabs (Furosemide) ..... One tab by mouth two times a day    Lisinopril 10 Mg Tabs (Lisinopril) ..... One tab by mouth once daily  BP today: 122/68 Prior BP: 102/62 (05/24/2010)  Labs Reviewed: K+: 4.6 (09/12/2009) Creat: : 0.81 (09/12/2009)   Chol: 132 (01/24/2010)   HDL: 42 (01/24/2010)   LDL: 31 (01/24/2010)   TG: 296 (01/24/2010)  Problem # 5:  HYPERLIPIDEMIA (ICD-272.4) Assessment: Comment Only  Her updated medication list for this problem includes:    Crestor 40 Mg Tabs (Rosuvastatin calcium) ..... One tab by mouth at bedtime. Low fat dietdiscussed and encouraged  Orders: T-Hepatic Function [phone]) T-Lipid Profile [phone])  Labs Reviewed: SGOT: 17 (01/24/2010)   SGPT: 16 (01/24/2010)   HDL:42 (01/24/2010), 49 (09/12/2009)  LDL:31 (01/24/2010), 38 (09/12/2009)  Chol:132 (01/24/2010), 124 (09/12/2009)  Trig:296 (01/24/2010), 186 (09/12/2009)  Complete Medication List: 1)  Zanaflex 4 Mg Tabs (Tizanidine hcl) .... One tab by mouth three times a day 2)  Klor-con M20 20 Meq Tbcr (Potassium chloride crys cr) .... One tab by mouth two times a day 3)  Tramadol Hcl 50 Mg Tabs (Tramadol hcl) .... Two tabs by mouth two times a day 4)  Cardizem La 120 Mg Tb24 (Diltiazem hcl coated beads) .... One tab by mouth once daily 5)  Furosemide 20 Mg Tabs (Furosemide) .... One tab by mouth two times a day 6)  Lisinopril 10 Mg Tabs (Lisinopril) .... One tab by mouth once daily 7)  Celebrex 200 Mg Caps (Celecoxib) .... One tab by mouth two times a day 8)  Aspirin 81 Mg Tabs (Aspirin) .... One tab by mouth once daily 9)  Epipen 0.3 Mg/0.39ml (1:1000) Devi (Epinephrine hcl (anaphylaxis)) .... Use as directed 10)  Hydrocodone-acetaminophen 5-500 Mg Tabs (Hydrocodone-acetaminophen) .... One tab by mouth three times a day 11)  Alprazolam 1 Mg Tabs (Alprazolam) .... One tab by mouth two times a day. 12)  Crestor 40 Mg Tabs  (Rosuvastatin calcium) .... One tab by mouth at bedtime. 13)  Omeprazole 40 Mg Cpdr (Omeprazole) .... Take 1 capsule by mouth once a day 14)  Vitamin D (ergocalciferol) 50000 Unit Caps (Ergocalciferol) .... One cap by mouth once weekly  Other Orders: T-Basic Metabolic Panel [phone]) T-CBC w/Diff [phone]) T-TSH [id number]) Hemoccult Guaiac-1 spec.(in office) (82270) Pap Smear ([address]) Zoster (Shingles) Vaccine Live ([address]) Admin 1st Vaccine [phone])  Patient Instructions: 1)  Follow up appointment in 5.9months 2)  Tobacco is very bad for your health and your loved ones! You Should stop smoking!. 3)  Stop Smoking Tips: Choose a Quit date. Cut down before the Quit date. decide what you will do as a substitute when you feel the urge to smoke(gum,toothpick,exercise).start at 4 ciggs /day 4)  It is important that you exercise regularly at least 20 minutes 5 times a week. If you develop chest pain, have severe difficulty breathing, or feel very tired , stop exercising immediately and seek medical attention. 5)  You need to lose weight. Consider a lower calorie diet and regular exercise.Goal is 8 to 10 pounds  6)  BMP prior to visit, ICD-9: 7)  Hepatic Panel prior to visit, ICD-9: 8)  Lipid Panel  prior to visit, ICD-9:  fasting labs asap 9)  TSH prior to visit, ICD-9: 10)  CBC w/ Diff prior to visit, ICD-9:, HBA1C 11)  Pls start to de stress by lestening to music after work, instead of smoking. 12)  Zostavax today 13)  Schedule your mammogram.   Orders Added: 1)  Est. Patient 40-64 years [99396] 2)  T-Basic Metabolic Panel [[id number]] 3)  T-Hepatic Function [80076-22960] 4)  T-Lipid Profile [80061-22930] 5)  T-CBC w/Diff [[address]] 6)  T-TSH [09811-91478] 7)  T- Hemoglobin A1C [83036-23375] 8)  Hemoccult Guaiac-1 spec.(in office) [82270] 9)  Pap Smear [88150] 10)  Zoster (Shingles) Vaccine Live [90736] 11)  Admin 1st Vaccine [29562]   Immunizations  Administered:  Zostavax # 1:    Vaccine Type: Zostavax    Site: right deltoid    Mfr: Merck    Dose: 0.5 ml    Route: IM    Given by: Adella Hare LPN    Exp. Date: 06/18/2011    Lot #: [id number]    VIS given: 05/25/05 given October 03, 2010.   Immunizations Administered:  Zostavax # 1:    Vaccine Type: Zostavax    Site: right deltoid    Mfr: Merck    Dose: 0.5 ml    Route: IM    Given by: Adella Hare LPN    Exp. Date: 06/18/2011    Lot #: [id number]    VIS given: 05/25/05 given October 03, 2010.  Laboratory Results  Date/Time Received: October 03, 2010 9:13 AM  Date/Time Reported: October 03, 2010 9:13 AM   Stool - Occult Blood Hemmoccult #1: negative Date: 10/03/2010 Comments: 5030 5/14 51301 13L 10/13 Adella Hare LPN  October 03, 2010 9:13 AM

## 2010-10-19 NOTE — Progress Notes (Signed)
Summary: refill  Phone Note Call from Patient   Summary of Call: needs a refill on heart med. and hydrocodone [id number] Initial call taken by: Rudene Anda,  October 10, 2010 10:19 AM    Prescriptions: HYDROCODONE-ACETAMINOPHEN 5-500 MG  TABS (HYDROCODONE-ACETAMINOPHEN) one tab by mouth three times a day  #90 x 5   Entered by:   Adella Hare LPN   Authorized by:   Syliva Overman MD   Signed by:   Adella Hare LPN on 16/05/9603   Method used:   Printed then faxed to ...       [facility] [address] (retail)       [address]       [place], [place]  [address]       Ph: [phone]       Fax: [phone]   RxID:   [id number] CARDIZEM LA 120 MG  TB24 (DILTIAZEM HCL COATED BEADS) one tab by mouth once daily  #30 Capsule x 5   Entered by:   Adella Hare LPN   Authorized by:   Syliva Overman MD   Signed by:   Adella Hare LPN on 06/09/2535   Method used:   Print[facility] ...       [facility]       [address]       [place], [place]  [address]       Ph: [phone]       Fax: [phone]   RxID:   [id number]

## 2010-10-27 LAB — CBC
HCT: 34.9 % — ABNORMAL LOW (ref 36.0–46.0)
HCT: 39.9 % (ref 36.0–46.0)
HCT: 38.8 % (ref 36.0–46.0)
Hemoglobin: 11.2 g/dL — ABNORMAL LOW (ref 12.0–15.0)
Hemoglobin: 13 g/dL (ref 12.0–15.0)
MCH: 28.8 pg (ref 26.0–34.0)
MCHC: 32.6 g/dL (ref 30.0–36.0)
MCHC: 31.7 g/dL (ref 30.0–36.0)
MCV: 89.7 fL (ref 78.0–100.0)
MCV: 88 fL (ref 78.0–100.0)
Platelets: 243 10*3/uL (ref 150–400)
RBC: 3.89 MIL/uL (ref 3.87–5.11)
RBC: 4.49 MIL/uL (ref 3.87–5.11)
RDW: 13.1 % (ref 11.5–15.5)
WBC: 8.3 10*3/uL (ref 4.0–10.5)
WBC: 9 10*3/uL (ref 4.0–10.5)
WBC: 5.9 10*3/uL (ref 4.0–10.5)

## 2010-10-27 LAB — POCT I-STAT 3, ART BLOOD GAS (G3+)
O2 Saturation: 93 %
pCO2 arterial: 50.1 mmHg — ABNORMAL HIGH (ref 35.0–45.0)
pH, Arterial: 7.401 — ABNORMAL HIGH (ref 7.350–7.400)
pO2, Arterial: 69 mmHg — ABNORMAL LOW (ref 80.0–100.0)

## 2010-10-27 LAB — BLOOD GAS, ARTERIAL
Acid-Base Excess: 3.8 mmol/L — ABNORMAL HIGH (ref 0.0–2.0)
Drawn by: 206361
Patient temperature: 98.6
TCO2: 29.7 mmol/L (ref 0–100)
pCO2 arterial: 46.4 mmHg — ABNORMAL HIGH (ref 35.0–45.0)
pH, Arterial: 7.402 — ABNORMAL HIGH (ref 7.350–7.400)

## 2010-10-27 LAB — COMPREHENSIVE METABOLIC PANEL
Albumin: 4.3 g/dL (ref 3.5–5.2)
Alkaline Phosphatase: 84 U/L (ref 39–117)
BUN: 7 mg/dL (ref 6–23)
BUN: 21 mg/dL (ref 6–23)
CO2: 33 mEq/L — ABNORMAL HIGH (ref 19–32)
Calcium: 9.9 mg/dL (ref 8.4–10.5)
Chloride: 98 mEq/L (ref 96–112)
Creatinine, Ser: 0.77 mg/dL (ref 0.4–1.2)
GFR calc non Af Amer: >60 mL/min (ref >60)
Glucose, Bld: 127 mg/dL — ABNORMAL HIGH (ref 70–99)
Glucose, Bld: 101 mg/dL — ABNORMAL HIGH (ref 70–99)
Potassium: 3.5 mEq/L (ref 3.5–5.1)
Potassium: 4.4 mEq/L (ref 3.5–5.1)
Total Bilirubin: 0.6 mg/dL (ref 0.3–1.2)
Total Protein: 6.9 g/dL (ref 6.0–8.3)

## 2010-10-27 LAB — TYPE AND SCREEN: Antibody Screen: NEGATIVE

## 2010-10-27 LAB — BASIC METABOLIC PANEL
GFR calc Af Amer: >60 mL/min (ref >60)
GFR calc non Af Amer: >60 mL/min (ref >60)
Glucose, Bld: 167 mg/dL — ABNORMAL HIGH (ref 70–99)
Potassium: 3.9 mEq/L (ref 3.5–5.1)
Sodium: 142 mEq/L (ref 135–145)

## 2010-10-27 LAB — GLUCOSE, CAPILLARY: Glucose-Capillary: 151 mg/dL — ABNORMAL HIGH (ref 70–99)

## 2010-10-27 LAB — URINALYSIS, ROUTINE W REFLEX MICROSCOPIC
Ketones, ur: 15 mg/dL — AB
Nitrite: NEGATIVE
Protein, ur: NEGATIVE mg/dL
Urobilinogen, UA: 1 mg/dL (ref 0.0–1.0)

## 2010-10-27 LAB — URINE MICROSCOPIC-ADD ON

## 2010-10-27 LAB — PROTIME-INR
INR: 0.92 (ref 0.00–1.49)
Prothrombin Time: 12.6 seconds (ref 11.6–15.2)

## 2010-10-27 LAB — APTT: aPTT: 28 seconds (ref 24–37)

## 2010-10-27 LAB — ABO/RH: ABO/RH(D): O POS

## 2010-10-27 LAB — SURGICAL PCR SCREEN
MRSA, PCR: NEGATIVE
Staphylococcus aureus: NEGATIVE

## 2010-10-31 LAB — URINALYSIS, ROUTINE W REFLEX MICROSCOPIC
Glucose, UA: NEGATIVE mg/dL
Specific Gravity, Urine: 1.038 — ABNORMAL HIGH (ref 1.005–1.030)
pH: 5 (ref 5.0–8.0)

## 2010-10-31 LAB — COMPREHENSIVE METABOLIC PANEL
ALT: 16 U/L (ref 0–35)
Albumin: 4.3 g/dL (ref 3.5–5.2)
Alkaline Phosphatase: 77 U/L (ref 39–117)
Chloride: 104 mEq/L (ref 96–112)
Glucose, Bld: 91 mg/dL (ref 70–99)
Potassium: 4.8 mEq/L (ref 3.5–5.1)
Sodium: 141 mEq/L (ref 135–145)
Total Protein: 7 g/dL (ref 6.0–8.3)

## 2010-10-31 LAB — DIFFERENTIAL
Basophils Relative: 1 % (ref 0–1)
Eosinophils Absolute: 0.1 10*3/uL (ref 0.0–0.7)
Monocytes Absolute: 0.6 10*3/uL (ref 0.1–1.0)
Monocytes Relative: 8 % (ref 3–12)

## 2010-10-31 LAB — CBC
Hemoglobin: 13.6 g/dL (ref 12.0–15.0)
RDW: 13.5 % (ref 11.5–15.5)
WBC: 6.6 10*3/uL (ref 4.0–10.5)

## 2010-11-07 ENCOUNTER — Other Ambulatory Visit: Payer: Self-pay | Admitting: Family Medicine

## 2010-11-07 MED ORDER — ERGOCALCIFEROL 1.25 MG (50000 UT) PO CAPS: 50000.0000 [IU] | ORAL_CAPSULE | ORAL | Status: DC

## 2010-11-07 MED ORDER — ROSUVASTATIN CALCIUM 40 MG PO TABS: 40.0000 mg | ORAL_TABLET | Freq: Every day | ORAL | Status: DC

## 2010-11-07 MED ORDER — OMEPRAZOLE 40 MG PO CPDR: 40.0000 mg | DELAYED_RELEASE_CAPSULE | Freq: Every day | ORAL | Status: DC

## 2010-11-07 MED ORDER — FUROSEMIDE 20 MG PO TABS: 20.0000 mg | ORAL_TABLET | Freq: Two times a day (BID) | ORAL | Status: DC

## 2010-11-07 MED ORDER — POTASSIUM CHLORIDE CRYS ER 20 MEQ PO TBCR: 20.0000 meq | EXTENDED_RELEASE_TABLET | Freq: Two times a day (BID) | ORAL | Status: DC

## 2010-11-13 ENCOUNTER — Other Ambulatory Visit: Payer: Self-pay | Admitting: Family Medicine

## 2010-11-13 LAB — CBC WITH DIFFERENTIAL/PLATELET
Eosinophils Relative: 3 % (ref 0–5)
Hemoglobin: 13.1 g/dL (ref 12.0–15.0)
Lymphocytes Relative: 58 % — ABNORMAL HIGH (ref 12–46)
Lymphs Abs: 4.1 10*3/uL — ABNORMAL HIGH (ref 0.7–4.0)
MCV: 91.8 fL (ref 78.0–100.0)
Platelets: 278 10*3/uL (ref 150–400)
RBC: 4.65 MIL/uL (ref 3.87–5.11)
WBC: 7 10*3/uL (ref 4.0–10.5)

## 2010-11-13 LAB — BASIC METABOLIC PANEL
CO2: 30 mEq/L (ref 19–32)
Chloride: 99 mEq/L (ref 96–112)
Creat: 1.26 mg/dL — ABNORMAL HIGH (ref 0.40–1.20)
Sodium: 139 mEq/L (ref 135–145)

## 2010-11-13 LAB — HEMOGLOBIN A1C
Hgb A1c MFr Bld: 6 % — ABNORMAL HIGH (ref <5.7)
Mean Plasma Glucose: 126 mg/dL — ABNORMAL HIGH (ref <117)

## 2010-11-13 LAB — HEPATIC FUNCTION PANEL
Albumin: 4.9 g/dL (ref 3.5–5.2)
Total Bilirubin: 0.4 mg/dL (ref 0.3–1.2)

## 2010-11-13 LAB — TSH: TSH: 1.638 u[IU]/mL (ref 0.350–4.500)

## 2010-11-13 LAB — LIPID PANEL
HDL: 48 mg/dL (ref >39)
LDL Cholesterol: 53 mg/dL (ref 0–99)
Total CHOL/HDL Ratio: 2.8 Ratio

## 2010-11-17 LAB — CREATININE, SERUM
Creatinine, Ser: 1.21 mg/dL — ABNORMAL HIGH (ref 0.4–1.2)
GFR calc Af Amer: 55 mL/min — ABNORMAL LOW (ref >60)
GFR calc non Af Amer: 45 mL/min — ABNORMAL LOW (ref >60)

## 2010-11-27 LAB — CBC
Hemoglobin: 11.3 g/dL — ABNORMAL LOW (ref 12.0–15.0)
RDW: 12.9 % (ref 11.5–15.5)
WBC: 10.1 10*3/uL (ref 4.0–10.5)

## 2010-11-27 LAB — BASIC METABOLIC PANEL
BUN: 17 mg/dL (ref 6–23)
CO2: 26 mEq/L (ref 19–32)
Calcium: 9 mg/dL (ref 8.4–10.5)
Chloride: 107 mEq/L (ref 96–112)
GFR calc Af Amer: 27 mL/min — ABNORMAL LOW (ref >60)
GFR calc Af Amer: 52 mL/min — ABNORMAL LOW (ref >60)
GFR calc non Af Amer: 23 mL/min — ABNORMAL LOW (ref >60)
Glucose, Bld: 135 mg/dL — ABNORMAL HIGH (ref 70–99)
Potassium: 4.3 mEq/L (ref 3.5–5.1)
Potassium: 4.9 mEq/L (ref 3.5–5.1)
Sodium: 141 mEq/L (ref 135–145)

## 2010-11-27 LAB — DIFFERENTIAL
Basophils Absolute: 0 10*3/uL (ref 0.0–0.1)
Lymphocytes Relative: 16 % (ref 12–46)
Lymphs Abs: 1.6 10*3/uL (ref 0.7–4.0)
Monocytes Absolute: 0.9 10*3/uL (ref 0.1–1.0)
Neutro Abs: 7.6 10*3/uL (ref 1.7–7.7)

## 2010-12-21 ENCOUNTER — Other Ambulatory Visit: Payer: Self-pay | Admitting: Family Medicine

## 2010-12-22 MED ORDER — ALPRAZOLAM 1 MG PO TABS: 1.0000 mg | ORAL_TABLET | Freq: Two times a day (BID) | ORAL | Status: DC

## 2010-12-26 NOTE — Op Note (Signed)
NAMEBERYLE, BAGSBY               ACCOUNT NO.:  [id number]   MEDICAL RECORD NO.:  [id number]          PATIENT TYPE:  OBV   LOCATION:  A311                          FACILITY:  [facility]   PHYSICIAN:  Dalia Heading, M.D.  DATE OF BIRTH:  Feb 21, 1946   DATE OF PROCEDURE:  08/17/2008  DATE OF DISCHARGE:                               OPERATIVE REPORT   PREOPERATIVE DIAGNOSIS:  Incisional hernia.   POSTOPERATIVE DIAGNOSIS:  Incisional hernia.   PROCEDURE:  Laparoscopic incisional herniorrhaphy with mesh.   SURGEON:  Dalia Heading, MD   ANESTHESIA:  General endotracheal.   INDICATIONS:  The patient is a 65 year old black female with multiple  medical problems who presents with an incisional hernia.  This is in the  umbilical region.  The risks and benefits of the procedure including  bleeding, infection, and the possibility of an open procedure as well as  recurrence of the hernia were fully explained to the patient, gave  informed consent.   PROCEDURE NOTE:  The patient was placed in the supine position.  After  induction of general endotracheal anesthesia, the abdomen was prepped  and draped using the usual sterile technique with Betadine.  Surgical  site confirmation was performed.   A Veress needle was introduced into the epigastric region without  difficulty.  Confirmation of placement was done using the saline drop  test.  The abdomen was then insufflated 16 mmHg pressure.  An 11-mm  trocar was introduced into the left upper quadrant under direct  visualization without difficulty.  An additional 11-mm trocar was placed  in the left lower quadrant region and the right upper quadrant region.  The patient was noted to have multiple adhesions of omentum to the  periumbilical region as well as a lower midline surgical scar.  This was  freed away from the abdominal wall using the LigaSure.  The patient had  incarcerated omentum within the hernia defect and this was reduced using  sharp dissection.  Next, a 10 x 15-cm PROCEED mesh was inserted into the  abdominal cavity.  It was tacked at 4 quadrants using a 2-0 Prolene  suture.  Then a protractor was used to circumferentially tack the mesh  to the abdominal wall.  Next, an On-Q pain pump catheter was then  inserted along either side of the repair in the subcutaneous tissue.  This was attached to a 3-1/2 day pump.  All fluid was then evacuated  from the abdominal cavity prior to removal of the trocars.   All wounds were irrigated with normal saline.  All wounds were injected  with 0.5% Sensorcaine.  The skin was closed using staples.  Betadine  ointment dry sterile dressings were applied.   All tape and needle counts were correct at the end of the procedure.  The patient was extubated in the operating room and went back to  recovery room awake in stable condition.   COMPLICATIONS:  None.   SPECIMEN:  None.   BLOOD LOSS:  Minimal.      Dalia Heading, M.D.  Electronically Signed     MAJ/MEDQ  D:  08/17/2008  T:  08/17/2008  Job:  [id number]   cc:   Milus Mallick. [name], M.D.  Fax: [phone]

## 2010-12-26 NOTE — H&P (Signed)
[name], [name]               ACCOUNT NO.:  [id number]   MEDICAL RECORD NO.:  [id number]          PATIENT TYPE:  INP   LOCATION:  A217                          FACILITY:  [facility]   PHYSICIAN:  Skeet Latch, DO    DATE OF BIRTH:  Dec 29, 1945   DATE OF ADMISSION:  03/29/2007  DATE OF DISCHARGE:  LH                              HISTORY & PHYSICAL   PRIORITY ADMISSION HISTORY AND PHYSICAL   PRIMARY CARE PHYSICIAN:  Milus Mallick. [name], MD.   CHIEF COMPLAINT:  Chest heaviness/pain.   HISTORY OF PRESENT ILLNESS:  This is a 65 year old, African-American  female, who presents with the complaint of shortness of breath and chest  heaviness.  The patient states that for the past four days she has been  having problems on lying flat in bed to sleep.  She states that she  feels that she is having heaviness and it feels like there is a weight  sitting on her chest.  The patient has been having shortness of breath  and minimal chest pain during these episodes.  The patient denies any  radiation to any extremities, and states that she has never had this  sensation before.  The patient states that this a.m. it increased in  nature and got worse decided to come to the emergency room for  evaluation.  The patient has a history of CHF, coronary artery disease  and hypertension and is being followed by Dr. Domingo Sep for her coronary  artery disease and CHF.   PAST MEDICAL HISTORY:  1. Coronary artery disease.  2. Hypertension.  3. CHF.  4. Chronic back pain.  5. Hyperlipidemia.   PAST FAMILY HISTORY:  Coronary artery disease, diabetes, and  hypertension.   PAST SURGICAL HISTORY:  1. Hysterectomy.  2. Cholecystectomy.  3. Laminectomy.   SOCIAL HISTORY:  She is a five cigarette per day smoker for over 40  years, occasional drinker, denies any drug abuse.   DRUG ALLERGIES:  CODEINE.   HOME MEDICATIONS:  1. Lovastatin 20 mg daily.  2. Lisinopril 10 mg daily.  3. Lasix 20 mg twice a  day.  4. Nexium 40 mg daily.  5. Cardizem 120 mg daily.  6. Aspirin 81 mg daily.  7. Potassium chloride 20 mEq daily.  8. Celebrex 200 mg daily.  9. Vicodin as needed.  10.Ultram as needed.  11.Nitroglycerin sublingual p.r.n.  12.Epipen as needed.  13.Spiriva one puff daily.  14.She takes a muscle relaxer three times a day.   REVIEW OF SYSTEMS:  GENERAL:  Denies any weight loss, weakness, decrease  in appetite, chills, fever.  HEENT:  Denies any eye complaints, neck,  throat, or ear complaints.  CARDIOVASCULAR:  Positive for chest  heaviness, no palpitations.  RESPIRATORY:  Positive for dyspnea and  cough, denies any wheezing.  GASTROINTESTINAL:  No nausea, vomiting,  diarrhea, or abdominal pain.  GENITOURINARY:  No urgency or dysuria.  SKIN:  Negative.  NEUROLOGIC:  Negative.  All other systems are  negative.   PHYSICAL EXAMINATION:  GENERAL:  The patient is alert and oriented x3,  pleasant and cooperative,  looks her stated age, in no acute distress.  VITAL SIGNS:  Heart rate is 93, blood pressure 114/72, pulse 83,  temperature is 98.0, her saturation is 99% on 3 liters.  HEENT:  Head is atraumatic and normocephalic, PERRLA, EOMI.  NECK:  Soft, supple, nontender, and nondistended, no JVD, no thyromegaly  is appreciated.  CARDIOVASCULAR:  Regular rate and rhythm, no rubs, gallops, or murmurs.  RESPIRATORY:  Lungs are clear bilaterally, no rales, rhonchi, or  wheezing.  ABDOMEN:  Soft, nontender, and nondistended, no splenomegaly is  appreciated, no guarding or rebound, positive bowel sounds.  EXTREMITIES:  No clubbing, cyanosis, or edema is noted.  NEUROLOGIC:  Cranial nerves II-XII are grossly intact, the patient is  alert and oriented x3.  SKIN:  No rashes or petechiae are appreciated.   ELECTROCARDIOGRAM:  Normal sinus rhythm, no ST or T-wave changes.   CHEST X-RAY:  No acute changes.   LABORATORY DATA:  BNP is less than 30.  Sodium 142, potassium 4.2,  chloride is 105,  CO2 34, glucose 106, BUN 11, creatinine 0.88.  White  count is 5.9, hemoglobin 13.3, hematocrit 40.5, platelets 298.  First  troponin is less than 0.05.   ASSESSMENT:  This is a 65 year old, black female, who presents with a  four day history of chest heaviness.  The patient states she has been  having trouble lying flat on her back, this causes her to be short of  breath.  The patient feels that if she cannot breathe and has a  smothering-type sensation.  The patient has a history of congestive  heart failure and coronary artery disease.   PLAN:  1. For chest pain/heaviness, the patient had an EKG that is normal at      this time and repeat in the a.m.  Also get a Cardiology consult,      patient will be seen by Dr. Domingo Sep, who requested her      consultation.  The patient will be placed on nitrates, aspirin,      oxygen.  Will hold beta blocker secondary to the patient's history      of tobacco abuse.  2. Her hypertension seems to be stable at this time.  Continue home      medications.  3. For her hyperlipidemia, patient is on a statin and will recheck her      lipid panel in the morning.  4. For her history of CHF, patient is on Lasix and this will be      continued, and if patient has not had a two-D echo in the last      three months, this needs to be done.  Also will recheck her BNP in      the a.m.  5. For her tobacco abuse, the patient will be placed on a nicotine      patch.  6. Lastly, the patient will be placed on DVT as well as GI      prophylaxis.      Skeet Latch, DO  Electronically Signed     SM/MEDQ  D:  03/29/2007  T:  03/29/2007  Job:  [id number]   cc:   Milus Mallick. [name], M.D.  Fax: [phone]   Dani Gobble, MD  Fax: [phone]

## 2010-12-26 NOTE — Letter (Signed)
April 26, 2010   Milus Mallick. [name], MD  [address]  [place], [place] [address]   Re:  [name], [name]               DOB:  18-Mar-1946   Dear Dr. [name]:   The patient came today after having a thymectomy resected.  She is doing  well overall.  Her incision is well healed.  Her sternum is healing  also, and chest x-ray showed normal postoperative changes.  We will  continue to follow her, put on a regular followup schedule.  I will see  her back again in 3 months with a chest x-ray.   Ines Bloomer, M.D.  Electronically Signed   DPB/MEDQ  D:  04/26/2010  T:  04/27/2010  Job:  [id number]

## 2010-12-26 NOTE — Letter (Signed)
March 23, 2009   Milus Mallick. [name], M.D.  [address]  [place], [place] [address]   Re:  [name], [name]               DOB:  05-11-1946   Dear Dr. [name]:   I saw the patient back today.  She has had more pain in her back and has  apparently got an epidural injection.  She also had an umbilical hernia  repair recently.  Her CT scan were reviewed, and it still shows the  anterior mediastinal mass, but it is unchanged.  It maybe slightly  bigger than what it was 2 years ago, but really minimal change in the  last year.  Because of this, I will think I will need to follow her  least one more year, and I will plan to get another CT scan in 9 months.   Ines Bloomer, M.D.  Electronically Signed   DPB/MEDQ  D:  03/23/2009  T:  03/24/2009  Job:  [id number]

## 2010-12-26 NOTE — Letter (Signed)
March 23, 2009   [name]. [name], MD  [address]  [place], [place] [address]   Re:  [name], [name]               DOB:  [birth year]   Dear Dr. [name]:   I saw the patient in the office today.  Her blood pressure   Ines Bloomer, M.D.    DPB/MEDQ  D:  03/23/2009  T:  03/24/2009  Job:  [id number]

## 2010-12-26 NOTE — Assessment & Plan Note (Signed)
OFFICE VISIT   [name], [name]  DOB:  [birth year]                                        Dec 31, 2007  CHART #:  [id number]   HISTORY:  [name] is a 65 year old black female Dr. Edwyna Shell has been  following in the office since September of 2008 for an anterior  mediastinal mass.  Serial CT scans have been done.  Prior to the most  recent one, there was a study done July 02, 2007, which showed the  lesion to be 15 mm by 18.5 mm.  On today's study, the lesion is now  measured at 19 by 19 mm.  She denies any recent symptoms related to  this.  She continues to smoke cigarettes.   PHYSICAL EXAM:  Vital signs:  Blood pressure 139/86, pulse 98,  respirations 18, oxygen saturation is 94% on room air.  General:  Obese  black female, in no acute distress.  Pulmonary examination reveals clear  breath sounds throughout.  Cardiac examination:  Regular rate and  rhythm.  Abdominal examination:  Soft, nontender, obese.   ASSESSMENT:  Due to the findings of slight increase in size of this  lesion, Dr. Edwyna Shell feels as though the next step should be a PET scan.  This will be undertaken within the next week.  We will see the patient  back following the completion of this study for further discussion of  possible treatments based on these results.   Rowe Clack, P.A.-C.   [name]  D:  12/31/2007  T:  12/31/2007  Job:  [id number]   cc:   Milus Mallick. [name], M.D.

## 2010-12-26 NOTE — Letter (Signed)
January 14, 2008   Milus Mallick. [name], M.D.  [address]  [place], [place] [address]   Re:  CHERELLE, MIDKIFF               DOB:  Dec 30, 1945   Dear Claris Che:   I saw Mrs. Dezeeuw back today after a PET scan and fortunately there is no  uptake, so I think this is probably a benign mass and we can safely  follow this.  Since there was a minimal change in this mass, I think we  can continue to follow it.  However, if it does increase in size, then  she will be subject to a thymectomy.  I plan to see her back again in 4  months with a CT scan of her chest.  Her blood pressure was 166/95,  pulse 100, respirations 18, sats were 95%.  I appreciate the opportunity  of seeing Mrs. Maggi.   Ines Bloomer, M.D.  Electronically Signed   DPB/MEDQ  D:  01/14/2008  T:  01/14/2008  Job:  [id number]

## 2010-12-26 NOTE — Letter (Signed)
April 24, 2007   Milus Mallick. [name], M.D.  [address]  [place], [place] [address]   Re:  JENNIGER, FIGIEL               DOB:  Jul 29, 1946   Dear Dr. [name]:   I appreciate the opportunity of seeing your patient. .  This 65 year old  patient has a long history of hypertension, hypercholesterolemia, and  congestive heart failure and went to the emergency room with pressure in  her chest and at that time underwent a CT scan probably to rule out  pulmonary embolus, which revealed a 2-cm lesion in the thymus gland,  which would be either a lymph node or an early thymoma.  She is referred  here for evaluation.  She continued to smoke 10 cigarettes a day.  She  had no hemoptysis, fever, chills, excessive sputum.  Her main symptoms  have been congestive heart failure.  She is not allergic to any  medications.  She has chronic obstructive pulmonary disease, reflux.   Her medications include Zanaflex 20 mg 3 times a day, Cardizem 120 mg a  day, hydrocodone for pain, furosemide 20 mg a day, alprazolam 0.5 mg a  day, lisinopril 10 mg a day, Os-Cal, Nexium 40 mg a day, Bayer aspirin  81 mg a day, tramadol as needed.   HISTORY:  She says she gets colonoscopies and yearly followup from her  medical doctor.   FAMILY HISTORY:  Positive for cardiac disease.  Father had throat cancer  and is also positive for diabetes and GERD.   SOCIAL HISTORY:  She is married.  Occupation:  She is a Catering manager, and  Conservation officer, nature, and Surveyor, mining.  She smokes 10 cigarettes a day, or half  pack a day.  Does not drink alcohol on a regular basis.   REVIEW OF SYSTEMS:  She has some weight gain.  CARDIAC:  See History of Present Illness.  She has chest pressure and  tightness.  No angina, no atrial fibrillation.  PULMONARY:  She has had a cough, bronchitis, and wheezing.  GI:  She has reflux and dysphagia.  GU:  No kidney disease.  No dysuria or frequent urination.  VASCULAR:  No claudication, DVT, or TIA, but  she does have some pain  with ambulation.  NEUROLOGICAL:  She has sinus headaches.  No blackouts or seizures.  MUSCULOSKELETAL:  Arthritis.  PSYCHIATRIC:  No problems with depression, but some nervousness.  ENT:  No change in her eyesight or hearing.  HEMATOLOGICAL:  No problems with bleeding, clotting disorders or anemia.   PHYSICAL EXAMINATION:  She is an obese [place] American female, no acute  distress.  Her blood pressure is 110/65, pulse 100, respirations 18,  saturations were 95%.  Head, eyes, ears, nose, and throat are  unremarkable.  Neck is supple without thyromegaly.  There is no  supraclavicular or axillary adenopathy.  There are no carotid bruits.  Chest is clear to auscultation and percussion.  Heart:  Regular sinus  rhythm.  No murmurs.  Abdomen:  Soft.  There is no hepatosplenomegaly.  Pulses 2+.  Neurological:  Intact.   I discussed the options of this 2-cm lesion in her thymus gland and I  think it is too small to biopsy.  We could get a PET scan, but right now  we will just continue to follow it and only get a PET scan if it starts  getting larger, so I will see her back in 2 months with  a repeat CT scan  without contrast.   I appreciate the opportunity of seeing Ms. Dade.   Sincerely,   Ines Bloomer, M.D.  Electronically Signed   DPB/MEDQ  D:  04/24/2007  T:  04/25/2007  Job:  [id number]

## 2010-12-26 NOTE — H&P (Signed)
NAME:  [name], [name]               ACCOUNT NO.:  [id number]   MEDICAL RECORD NO.:  [id number]          PATIENT TYPE:  AMB   LOCATION:  DAY                           FACILITY:  [facility]   PHYSICIAN:  Dalia Heading, M.D.  DATE OF BIRTH:  01-16-1946   DATE OF ADMISSION:  DATE OF DISCHARGE:  LH                              HISTORY & PHYSICAL   CHIEF COMPLAINT:  Incisional hernia.   HISTORY OF PRESENT ILLNESS:  The patient is a 65 year old black female  who was referred for evaluation and treatment of an incisional hernia.  It started several months ago.  It is made worse with straining.  She  has a surgical scar at the umbilicus where the hernia is occurring.  No  nausea or vomiting have been noted.   PAST MEDICAL HISTORY:  Hypertension, chronic back pain, intermittent  headaches, hyperlipidemia, hypertension.   PAST SURGICAL HISTORY:  Back surgery, hysterectomy, cholecystectomy,  herniorrhaphy.   CURRENT MEDICATIONS:  1. Zanaflex 4 mg p.o. t.i.d.  2. Klor-Con 20 mEq p.o. b.i.d.  3. Tramadol 50 mg 2 tablets p.o. b.i.d.  4. Cardizem 120 mg p.o. daily.  5. Lasix 20 mg p.o. b.i.d.  6. Lisinopril 10 mg p.o. daily.  7. Celebrex 200 mg p.o. b.i.d.  8. Baby aspirin which she is holding.  9. Nexium 40 mg p.o. daily.  10.EpiPen p.r.n.  11.Hydrocodone/acetaminophen 1 tablet p.o. t.i.d.  12.Tussionex p.r.n.  13.Alprazolam 1 mg p.o. b.i.d.  14.Crestor 40 mg p.o. at bedtime.   ALLERGIES:  SODIUM.   REVIEW OF SYSTEMS:  The patient denies any recent chest pain, MI, CVA,  diabetes mellitus.   PHYSICAL EXAMINATION:  GENERAL:  The patient is a well-developed, well-  nourished black female in no acute distress.  LUNGS:  Clear to auscultation with equal breath sounds bilaterally.  HEART:  Regular rate and rhythm without S3, S4, or murmurs.  ABDOMEN:  Soft, nontender, nondistended.  No hepatosplenomegaly or  masses are noted.  A somewhat reducible umbilical hernia is present in  former trocar  site.   IMPRESSION:  Incisional hernia.   PLAN:  The patient is scheduled for laparoscopic incisional  herniorrhaphy with mesh on August 17, 2008.  The risks and benefits of  the procedure including bleeding, infection, pain, the possibility of  recurrence of the hernia were fully explained to the patient, gave  informed consent.      Dalia Heading, M.D.  Electronically Signed     MAJ/MEDQ  D:  08/10/2008  T:  08/11/2008  Job:  [id number]   cc:   Milus Mallick. [name], M.D.  Fax: [phone]   Short Stay at [facility]

## 2010-12-26 NOTE — Letter (Signed)
July 02, 2007   Milus Mallick. [name], M.D.  [address]  [place], [place] [address]   Re:  AYONA, YNIGUEZ               DOB:  1946/04/02   Dear Ms. Simpson:   I saw Ms. Chewning back in the office today for anterior mediastinal nodule.  CT scan shows that this nodule is still present but is actually maybe  slightly smaller.  It is 15 mm by 18.5 which is still smaller than on  the previous CT.  There is definitely no change.  Given it has not  changed at this time I think the best thing to do is to continue to  follow this so I have recommended she get another CT scan in  approximately 6 months.  I will see her back at that time.  If the  nodule is bigger then we will recommend resection.  If it is not, we  will continue to follow it.  Her blood pressure was 133/76, pulse 86,  respirations 18, sats were 96%.   Sincerely,   Ines Bloomer, M.D.  Electronically Signed   DPB/MEDQ  D:  106/07/202008  T:  07/03/2007  Job:  [id number]

## 2010-12-26 NOTE — Letter (Signed)
February 17, 2010   Milus Mallick. [name], MD  [address]  [place], [place] [address]   Re:  SHELEEN, CONCHAS               DOB:  [birth year]   Dear Dr. [name]:   I saw the patient in the office today.  She apparently has put off her  back surgery and is feeling better from that standpoint.  We will  readdress the fact of her mediastinal mass and feel that this needs to  be excised since it has increased in size.  I has increased rapidly but  has increased in size.  Because of this, we have tentatively scheduled  for surgery for March 27, 2010, at [facility].  We will do a partial  median sternotomy and resection.  The patient understand this and agrees  to the surgery.   Sincerely,   Ines Bloomer, M.D.  Electronically Signed   DPB/MEDQ  D:  02/17/2010  T:  02/17/2010  Job:  [id number]   cc:   Payton Doughty, M.D.

## 2010-12-26 NOTE — Letter (Signed)
April 05, 2010   Milus Mallick. [name], MD  [address]  [place], [place] [address]   Re:  Melanie, Kramer               DOB:  1945-09-01   Dear Dr. [name]:   I saw the patient back today after her thymectomy.  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  No further treatment will be  necessary at the present time.  We will continue to follow her, but we  saw her in the office today.  Her blood pressure was 122/75, pulse 98,  respirations 16, sats were 95%.  However, she could start driving,  gradually increase her activities and not use her activities test but  still limit her lifting.  Her sternum was well healed.  Her incision was  also healing well.  I will see her back again in 3 weeks.  Chest x-ray  showed normal postoperative change.   Ines Bloomer, M.D.  Electronically Signed   DPB/MEDQ  D:  04/05/2010  T:  04/06/2010  Job:  [id number]

## 2010-12-26 NOTE — Discharge Summary (Signed)
[name], [name]               ACCOUNT NO.:  [id number]   MEDICAL RECORD NO.:  [id number]          PATIENT TYPE:  INP   LOCATION:  A217                          FACILITY:  [facility]   PHYSICIAN:  Osvaldo Shipper, MD     DATE OF BIRTH:  [birth year]   DATE OF ADMISSION:  03/29/2007  DATE OF DISCHARGE:  08/18/2008LH                               DISCHARGE SUMMARY   Please see H&P dictated by Dr. Lilian Kapur for details regarding patient's  presenting illness.   PRIMARY CARE PHYSICIAN:  Milus Mallick. [name], M.D.   DISCHARGE DIAGNOSES:  1. Chest pain, noncardiac origin.  2. Abnormal mediastinal lymph node requiring followup.  3. History of hypertension, stable.  4. History of dyslipidemia, stable.   HOSPITAL COURSE:  Briefly, this is a 65 year old African-American female  who presented to the ED complaining of shortness of breath and chest  heaviness. She was having these symptoms predominantly by lying flat on  the bed.  Pain was described as a heaviness on her chest.  Because of  her risk factors, the patient was admitted to the hospital.  She ruled  out for acute coronary syndrome.  Her EKGs did not show any significant  acute findings.  Apparently she has been evaluated by [facility]iology in the past, and she obtained consultation from them.  Today  Dr. Jenne Campus saw the patient and said that she would need a CAT scan of  her chest to rule out dissection and PE, and if that was negative, she  could go home.  The CT scan did come back negative for aortic dissection  or acute pulmonary embolism.  Hence, the patient is considered okay for  discharge.   Incidentally, a large mediastinal lymph node was noted which was 1.9 x  1.6 cm in the anterior mediastinum.  Dr. Molli Posey, radiologist, called me  and discussed this finding and recommended a PET CT to further evaluate  this lesion.  Hence, we will arrange this as an outpatient at [facility].   The patient in the  afternoon today was very somnolent.  Her pulse  oximetry apparently had gone down to 87%.  When I saw the patient, she  appeared to be very sleepy.  According to the nurse, she was given a  dose of Xanax a few hours ago.  The patient likely is suffering from  side effects of that.  The nurse subsequently ambulated the patient and  rechecked her pulse oximetry, and it was 96% on room air.   Hence, based on all of the above, she is considered stable for  discharge.  Her vital signs have all been stable.  Telemetry has not  shown any arrhythmia.  Examination was unremarkable.   As workup done previously at [facility], she had a cardiac  catheterization a few months ago which apparently showed clean  coronaries and normal left ventricular function.  She also had a  negative sleep study.  She also had carotid Dopplers, the results of  which were also negative apparently.  She has normal systolic function.   DISCHARGE MEDICATIONS:  No changes have been made to any of her home  medications which are as follows:  1. Lovastatin 20 mg daily.  2. Lisinopril 10 mg daily.  3. Lasix 20 mg b.i.d.  4. Nexium 40 mg daily.  5. Cardizem CD 120 mg daily.  6. Aspirin 81 mg daily.  7. Potassium chloride 20 mEq daily.  8. Celebrex 200 mg daily.  9. Vicodin as needed.  10.Ultram as needed.  11.Nitroglycerine sublingual as needed.  12.Epipen as needed.  13.Spiriva 1 puff daily.  14.Muscle relaxant of unknown kind 3 times a day.   FOLLOWUP:  1. With Milus Mallick. [name], M.D. as needed.  2. A PET CT will be scheduled for this patient at [facility] to      evaluate a large lymph node.   DIET:  Heart-healthy diet.   PHYSICAL ACTIVITY:  She may return to her previous level of activity.   The patient is discharged home, and her husband was there to take her  home.   Total discharge time was about 35 minutes.      Osvaldo Shipper, MD  Electronically Signed     GK/MEDQ  D:  03/31/2007  T:   03/31/2007  Job:  [id number]   cc:   Milus Mallick. [name], M.D.  Fax: [phone]   Darlin Priestly, MD  Fax: [phone]

## 2010-12-26 NOTE — Discharge Summary (Signed)
NAME:  [name], [name]               ACCOUNT NO.:  [id number]   MEDICAL RECORD NO.:  [id number]          PATIENT TYPE:  INP   LOCATION:  A311                          FACILITY:  [facility]   PHYSICIAN:  Dalia Heading, M.D.  DATE OF BIRTH:  [birth year]   DATE OF ADMISSION:  08/17/2008  DATE OF DISCHARGE:  01/07/2010LH                               DISCHARGE SUMMARY   HOSPITAL COURSE SUMMARY:  The patient is a 65 year old black female with  multiple problems, who presented for a surgery to repair an incisional  hernia.  She underwent laparoscopic hand-assisted incisional  herniorrhaphy with mesh on August 17, 2008.  She has tolerated the  procedure well.  Postoperative course was remarkable for mild urinary  retention secondary to urethral stricture.  This was relieved with a  Foley catheter.  The patient's diet was advanced without difficulty.  The patient is being discharged home on postoperative day 2 in good and  improving condition without the Foley catheter.   DISCHARGE MEDICATIONS:  1. Zanaflex 4 mg p.o. t.i.d.  2. Klor-Con 20 mEq p.o. b.i.d.  3. Tramadol 50 mg 2 tablets p.o. b.i.d.  4. Cardizem 120 mg p.o. daily.  5. Lasix 20 mg p.o. b.i.d.  6. Lisinopril 10 mg p.o. daily.  7. Celebrex 200 mg p.o. b.i.d.  8. Baby aspirin 1 tablet p.o. daily.  9. Nexium 40 mg p.o. daily.  10.Xanax 1 mg p.o. b.i.d.  11.Crestor 50 mg p.o. nightly.  12.Vicodin 1-2 tablets p.o. q.4 h. p.r.n. pain.  13.She was also going home on ON-Q catheter pain relief system.  She      is to remove this at home on August 20, 2008.   FOLLOWUP:  The patient is to follow up Dr. Franky Macho on August 26, 2008.   PRINCIPAL DIAGNOSES:  1. Incisional hernia.  2. Hypertension.  3. Chronic back pain.  4. Hyperlipidemia.   PRINCIPAL PROCEDURE:  Laparoscopic incisional herniorrhaphy with mesh on  August 19, 2008.      Dalia Heading, M.D.  Electronically Signed     MAJ/MEDQ  D:  08/19/2008  T:  08/19/2008   Job:  [id number]   cc:   Milus Mallick. [name], M.D.  Fax: [phone]

## 2010-12-26 NOTE — Letter (Signed)
January 11, 2010   Payton Doughty, MD  [address]  [address]  [place], [place] [address]   Re:  [name], [name]               DOB:  09-Feb-1946   Dear Loraine Leriche,   I think you have seen the patient for consideration of back surgery.  She apparently had a fall recently and had some injury to her left  ankle, which is being taken care of by Dr. Farris Has and Dr. Eulah Pont.  We  have been following her for an anterior mediastinal lesion that we have  been following for almost 2 years.  It had been stable for almost 18  months.  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  Given that it is increased in size and was borderline, positive on PET  scan, this will need to be removed via median sternotomy.  Since this is  a very slow-growing lesion, this does not need to be removed  immediately, but I would recommend to be removed sometime in the next 2  months or so.  She will be seeing Dr. Farris Has regarding her ankle and  then you regarding her back.  Please let me know your thoughts, so I can  schedule the surgery in the near future.  I appreciate the opportunity  of seeing the patient.   Sincerely,   Ines Bloomer, M.D.  Electronically Signed   DPB/MEDQ  D:  01/11/2010  T:  01/12/2010  Job:  [id number]   cc:   Estell Harpin, M.D.  Loreta Ave, M.D.  Milus Mallick. [name], M.D.

## 2010-12-29 NOTE — H&P (Signed)
NAME:  [name], [name]                           ACCOUNT NO.:  [id number]   MEDICAL RECORD NO.:  [id number]                  PATIENT TYPE:   LOCATION:                                       FACILITY:   PHYSICIAN:  Dalia Heading, M.D.               DATE OF BIRTH:   DATE OF ADMISSION:  DATE OF DISCHARGE:                                HISTORY & PHYSICAL   CHIEF COMPLAINT:  Need for screening colonoscopy.   HISTORY OF PRESENT ILLNESS:  The patient is a 65 year old black female who  is referred for endoscopic evaluation.  She needs a colonoscopy for  screening purposes.  She denies any abdominal complaints.  She has never had  a colonoscopy.  There is no family history of colon carcinoma.   PAST MEDICAL HISTORY:  Includes back pain, hypertension.   PAST SURGICAL HISTORY:  1. Back surgery.  2. Hysterectomy.  3. Cholecystectomy.  4. Herniorrhaphy.   CURRENT MEDICATIONS:  .  Norvasc, Ultram, Prevacid, Celebrex, Xanax, Sonata, Lescol,  hydrochlorothiazide.   ALLERGIES:  CODEINE.   REVIEW OF SYSTEMS:  The patient does smoke a half pack of cigarettes a day.  She denies any significant bleeding disorder.  She denies significant  alcohol use.   PHYSICAL EXAMINATION:  GENERAL:  The patient is a well-developed,well-  nourished black female in no acute distress.  VITAL SIGNS:  She is afebrile and vital signs are stable.  LUNGS:  Clear to auscultation with equal breath sounds bilaterally.  HEART:  Reveals a regular rate and rhythm without S3, S4, or murmurs.  ABDOMEN:  Soft, nontender, nondistended.  No hepatosplenomegaly or masses  are noted.  RECTAL:  Deferred to the procedure.   IMPRESSION:  Need for screening colonoscopy.   PLAN:  The patient is scheduled for a colonoscopy on October 18, 2003. The  risks and benefits of the procedure including bleeding and perforation were  fully explained to the patient, who gave informed consent.      ___________________________________________                                         Dalia Heading, M.D.   MAJ/MEDQ  D:  10/14/2003  T:  10/14/2003  Job:  [id number]   cc:   Milus Mallick. [name], M.D.  [address]  [place], [place] [address]  Fax: [phone]

## 2011-01-02 ENCOUNTER — Other Ambulatory Visit: Payer: Self-pay

## 2011-01-02 ENCOUNTER — Telehealth: Payer: Self-pay | Admitting: Family Medicine

## 2011-01-02 MED ORDER — CELECOXIB 200 MG PO CAPS: ORAL_CAPSULE | ORAL | Status: DC

## 2011-01-02 MED ORDER — TRAMADOL HCL 50 MG PO TBSO: ORAL_TABLET | ORAL | Status: DC

## 2011-01-02 MED ORDER — TIZANIDINE HCL 4 MG PO TABS: ORAL_TABLET | ORAL | Status: DC

## 2011-01-02 MED ORDER — ERGOCALCIFEROL 1.25 MG (50000 UT) PO CAPS: ORAL_CAPSULE | ORAL | Status: DC

## 2011-01-03 ENCOUNTER — Other Ambulatory Visit: Payer: Self-pay | Admitting: *Deleted

## 2011-01-03 MED ORDER — CELECOXIB 200 MG PO CAPS: ORAL_CAPSULE | ORAL | Status: DC

## 2011-01-03 NOTE — Telephone Encounter (Signed)
Left message advising meds were sent yesterday

## 2011-01-23 ENCOUNTER — Other Ambulatory Visit: Payer: Self-pay | Admitting: Family Medicine

## 2011-01-23 MED ORDER — LISINOPRIL 10 MG PO TABS: 10.0000 mg | ORAL_TABLET | Freq: Every day | ORAL | Status: DC

## 2011-01-23 MED ORDER — ERGOCALCIFEROL 1.25 MG (50000 UT) PO CAPS: ORAL_CAPSULE | ORAL | Status: DC

## 2011-01-23 MED ORDER — POTASSIUM CHLORIDE CRYS ER 20 MEQ PO TBCR: 20.0000 meq | EXTENDED_RELEASE_TABLET | Freq: Two times a day (BID) | ORAL | Status: DC

## 2011-01-23 MED ORDER — EPINEPHRINE 0.3 MG/0.3ML IJ DEVI: 0.3000 mg | INTRAMUSCULAR | Status: DC

## 2011-01-23 MED ORDER — FUROSEMIDE 20 MG PO TABS: 20.0000 mg | ORAL_TABLET | Freq: Two times a day (BID) | ORAL | Status: DC

## 2011-01-23 MED ORDER — OMEPRAZOLE 40 MG PO CPDR: 40.0000 mg | DELAYED_RELEASE_CAPSULE | Freq: Every day | ORAL | Status: DC

## 2011-01-23 NOTE — Telephone Encounter (Signed)
Refills sent as requested

## 2011-01-31 ENCOUNTER — Telehealth: Payer: Self-pay | Admitting: Family Medicine

## 2011-01-31 MED ORDER — EPINEPHRINE 0.3 MG/0.3ML IJ DEVI: 0.3000 mg | INTRAMUSCULAR | Status: DC

## 2011-01-31 NOTE — Telephone Encounter (Signed)
Med sent as requested 

## 2011-03-14 ENCOUNTER — Other Ambulatory Visit: Payer: Self-pay | Admitting: *Deleted

## 2011-03-14 MED ORDER — ROSUVASTATIN CALCIUM 40 MG PO TABS: 40.0000 mg | ORAL_TABLET | Freq: Every day | ORAL | Status: DC

## 2011-04-02 ENCOUNTER — Encounter: Payer: Self-pay | Admitting: Family Medicine

## 2011-04-03 ENCOUNTER — Ambulatory Visit (INDEPENDENT_AMBULATORY_CARE_PROVIDER_SITE_OTHER): Payer: 59 | Admitting: Family Medicine

## 2011-04-03 ENCOUNTER — Encounter: Payer: Self-pay | Admitting: Family Medicine

## 2011-04-03 VITALS — BP 132/74 | HR 120 | Resp 16 | Wt 207.1 lb

## 2011-04-03 DIAGNOSIS — E559 Vitamin D deficiency, unspecified: Secondary | ICD-10-CM

## 2011-04-03 DIAGNOSIS — I10 Essential (primary) hypertension: Secondary | ICD-10-CM

## 2011-04-03 DIAGNOSIS — E785 Hyperlipidemia, unspecified: Secondary | ICD-10-CM

## 2011-04-03 DIAGNOSIS — M549 Dorsalgia, unspecified: Secondary | ICD-10-CM

## 2011-04-03 DIAGNOSIS — IMO0002 Reserved for concepts with insufficient information to code with codable children: Secondary | ICD-10-CM

## 2011-04-03 DIAGNOSIS — E669 Obesity, unspecified: Secondary | ICD-10-CM

## 2011-04-03 DIAGNOSIS — R7303 Prediabetes: Secondary | ICD-10-CM

## 2011-04-03 DIAGNOSIS — F17200 Nicotine dependence, unspecified, uncomplicated: Secondary | ICD-10-CM

## 2011-04-03 DIAGNOSIS — R7309 Other abnormal glucose: Secondary | ICD-10-CM

## 2011-04-03 MED ORDER — TIZANIDINE HCL 4 MG PO TABS: ORAL_TABLET | ORAL | Status: DC

## 2011-04-03 MED ORDER — DILTIAZEM HCL ER COATED BEADS 120 MG PO CP24: 120.0000 mg | ORAL_CAPSULE | Freq: Every day | ORAL | Status: DC

## 2011-04-03 MED ORDER — OMEPRAZOLE 40 MG PO CPDR: 40.0000 mg | DELAYED_RELEASE_CAPSULE | Freq: Every day | ORAL | Status: DC

## 2011-04-03 MED ORDER — ERGOCALCIFEROL 1.25 MG (50000 UT) PO CAPS: ORAL_CAPSULE | ORAL | Status: DC

## 2011-04-03 MED ORDER — HYDROCODONE-ACETAMINOPHEN 5-500 MG PO TABS: 1.0000 | ORAL_TABLET | Freq: Four times a day (QID) | ORAL | Status: DC | PRN

## 2011-04-03 MED ORDER — LISINOPRIL 10 MG PO TABS: 10.0000 mg | ORAL_TABLET | Freq: Every day | ORAL | Status: DC

## 2011-04-03 MED ORDER — PHENTERMINE HCL 37.5 MG PO TABS: 37.5000 mg | ORAL_TABLET | Freq: Every day | ORAL | Status: DC

## 2011-04-03 MED ORDER — ROSUVASTATIN CALCIUM 40 MG PO TABS: 40.0000 mg | ORAL_TABLET | Freq: Every day | ORAL | Status: DC

## 2011-04-03 MED ORDER — CELECOXIB 200 MG PO CAPS: ORAL_CAPSULE | ORAL | Status: DC

## 2011-04-03 MED ORDER — FUROSEMIDE 20 MG PO TABS: 20.0000 mg | ORAL_TABLET | Freq: Two times a day (BID) | ORAL | Status: DC

## 2011-04-03 MED ORDER — ALPRAZOLAM 1 MG PO TABS: 1.0000 mg | ORAL_TABLET | Freq: Two times a day (BID) | ORAL | Status: DC

## 2011-04-03 MED ORDER — POTASSIUM CHLORIDE CRYS ER 20 MEQ PO TBCR: 20.0000 meq | EXTENDED_RELEASE_TABLET | Freq: Two times a day (BID) | ORAL | Status: DC

## 2011-04-03 NOTE — Patient Instructions (Addendum)
F/u in 3.5 months  It is important that you exercise regularly at least 30 minutes 5 times a week. If you develop chest pain, have severe difficulty breathing, or feel very tired, stop exercising immediately and seek medical attention    A healthy diet is rich in fruit, vegetables and whole grains. Poultry fish, nuts and beans are a healthy choice for protein rather then red meat. A low sodium diet and drinking 64 ounces of water daily is generally recommended. Oils and sweet should be limited. Carbohydrates especially for those who are diabetic or overweight, should be limited to 34-45 gram per meal. It is important to eat on a regular schedule, at least 3 times daily. Snacks should be primarily fruits, vegetables or nuts.   Lab today.  Fasting lab in 3months just before next visit LABWORK  NEEDS TO BE DONE BETWEEN 3 TO 7 DAYS BEFORE YOUR NEXT SCEDULED  VISIT.  THIS WILL IMPROVE THE QUALITY OF YOUR CARE.   MAMMOGRAM PAST DUE PLS SCHEDULE ASAP  Pls go to diabetic ed class, call and pre register,  Pls commit to reducing nicotine use  New med phenetermine for weight loss

## 2011-04-04 ENCOUNTER — Telehealth: Payer: Self-pay | Admitting: Family Medicine

## 2011-04-04 ENCOUNTER — Other Ambulatory Visit: Payer: Self-pay | Admitting: Family Medicine

## 2011-04-04 ENCOUNTER — Other Ambulatory Visit: Payer: Self-pay | Admitting: *Deleted

## 2011-04-04 DIAGNOSIS — E669 Obesity, unspecified: Secondary | ICD-10-CM

## 2011-04-04 LAB — HEMOGLOBIN A1C
Hgb A1c MFr Bld: 5.9 % — ABNORMAL HIGH (ref <5.7)
Mean Plasma Glucose: 123 mg/dL — ABNORMAL HIGH (ref <117)

## 2011-04-04 MED ORDER — PHENTERMINE HCL 37.5 MG PO TABS: 37.5000 mg | ORAL_TABLET | Freq: Every day | ORAL | Status: DC

## 2011-04-04 NOTE — Telephone Encounter (Signed)
Resent to [facility]

## 2011-04-10 NOTE — Progress Notes (Signed)
  Subjective:    Patient ID: [name], female    DOB: Mar 24, 1946, 65 y.o.   MRN: [id number]  HPI The PT is here for follow up and re-evaluation of chronic medical conditions, medication management and review of any available recent lab and radiology data.  Preventive health is updated, specifically  Cancer screening and Immunization.   Questions or concerns regarding consultations or procedures which the PT has had in the interim are  addressed. The PT denies any adverse reactions to current medications since the last visit.  There are no new concerns. Major issues are her weight and ongoing nicotine use. She intends to work on [employer]. Her back pain is reportedly less There are no specific complaints       Review of Systems Denies recent fever or chills. Denies sinus pressure, nasal congestion, ear pain or sore throat. Denies chest congestion, productive cough or wheezing. Denies chest pains, palpitations and leg swelling Denies abdominal pain, nausea, vomiting,diarrhea or constipation.   Denies dysuria, frequency, hesitancy or incontinence. Denies headaches, seizures, numbness, or tingling. Denies depression, anxiety or insomnia. Denies skin break down or rash.        Objective:   Physical Exam Patient alert and oriented and in no cardiopulmonary distress.  HEENT: No facial asymmetry, EOMI, no sinus tenderness,  oropharynx pink and moist.  Neck supple no adenopathy.  Chest: Clear to auscultation bilaterally.Decreased air entry bilaterally  CVS: S1, S2 no murmurs, no S3.  ABD: Soft non tender. Bowel sounds normal.  Ext: No edema  MS: decreased ROM spine,adequate in  shoulders, hips and knees.  Skin: Intact, no ulcerations or rash noted.  Psych: Good eye contact, normal affect. Memory intact not anxious or depressed appearing.  CNS: CN 2-12 intact, power, tone and sensation normal throughout.        Assessment & Plan:

## 2011-04-10 NOTE — Assessment & Plan Note (Signed)
Deteriorated. Patient re-educated about  the importance of commitment to a  minimum of 150 minutes of exercise per week. The importance of healthy food choices with portion control discussed. Encouraged to start a food diary, count calories and to consider  joining a support group. Sample diet sheets offered. Goals set by the patient for the next several months.    

## 2011-04-10 NOTE — Assessment & Plan Note (Signed)
Improved,  No surgical intervention planned at this time

## 2011-04-10 NOTE — Assessment & Plan Note (Signed)
Needs to reduce fatty food intake, no med change

## 2011-04-10 NOTE — Assessment & Plan Note (Signed)
Controlled, no change in medication  

## 2011-05-08 ENCOUNTER — Other Ambulatory Visit: Payer: Self-pay | Admitting: Family Medicine

## 2011-05-18 LAB — BASIC METABOLIC PANEL
Calcium: 9.6 mg/dL (ref 8.4–10.5)
GFR calc Af Amer: >60 mL/min (ref >60)
GFR calc non Af Amer: >60 mL/min (ref >60)
Sodium: 140 mEq/L (ref 135–145)

## 2011-05-18 LAB — CBC
Hemoglobin: 11.9 g/dL — ABNORMAL LOW (ref 12.0–15.0)
RBC: 3.97 MIL/uL (ref 3.87–5.11)

## 2011-05-25 LAB — BASIC METABOLIC PANEL
BUN: 11
BUN: 12
CO2: 35 — ABNORMAL HIGH
Calcium: 9.1
Chloride: 105
Creatinine, Ser: 0.81
GFR calc non Af Amer: >60
GFR calc non Af Amer: >60
Glucose, Bld: 104 — ABNORMAL HIGH
Potassium: 4.2
Sodium: 138
Sodium: 142

## 2011-05-25 LAB — CBC
HCT: 40.5
Hemoglobin: 12.3
Hemoglobin: 13.3
MCHC: 32.7
MCV: 86.3
Platelets: 278
Platelets: 298
RDW: 13.9
WBC: 5.9

## 2011-05-25 LAB — DIFFERENTIAL
Basophils Absolute: 0
Basophils Relative: 1
Eosinophils Absolute: 0
Eosinophils Relative: 1
Lymphocytes Relative: 46
Lymphocytes Relative: 58 — ABNORMAL HIGH
Lymphs Abs: 2.7
Monocytes Absolute: 0.5
Monocytes Absolute: 0.6
Monocytes Relative: 8
Neutro Abs: 2.5

## 2011-05-25 LAB — CARDIAC PANEL(CRET KIN+CKTOT+MB+TROPI)
CK, MB: 1.9
Relative Index: INVALID
Total CK: 70
Total CK: 72
Total CK: 72
Troponin I: 0.02
Troponin I: 0.02

## 2011-05-25 LAB — LIPID PANEL
HDL: 45
Triglycerides: 324 — ABNORMAL HIGH
VLDL: 65 — ABNORMAL HIGH

## 2011-05-25 LAB — URINE CULTURE: Colony Count: 8000

## 2011-05-25 LAB — PROTIME-INR: Prothrombin Time: 13.4

## 2011-05-25 LAB — POCT CARDIAC MARKERS
CKMB, poc: 1
Myoglobin, poc: 55.5
Troponin i, poc: <0.05

## 2011-05-25 LAB — B-NATRIURETIC PEPTIDE (CONVERTED LAB)
Pro B Natriuretic peptide (BNP): <30
Pro B Natriuretic peptide (BNP): <30

## 2011-05-25 LAB — APTT: aPTT: 34

## 2011-06-27 ENCOUNTER — Encounter: Payer: Self-pay | Admitting: Family Medicine

## 2011-07-02 ENCOUNTER — Ambulatory Visit: Payer: 59 | Admitting: Family Medicine

## 2011-07-02 ENCOUNTER — Encounter: Payer: Self-pay | Admitting: Family Medicine

## 2011-07-11 ENCOUNTER — Ambulatory Visit: Payer: 59 | Admitting: Family Medicine

## 2011-07-11 ENCOUNTER — Encounter: Payer: Self-pay | Admitting: Family Medicine

## 2011-07-16 ENCOUNTER — Encounter: Payer: Self-pay | Admitting: Family Medicine

## 2011-07-17 ENCOUNTER — Ambulatory Visit (INDEPENDENT_AMBULATORY_CARE_PROVIDER_SITE_OTHER): Payer: 59 | Admitting: Family Medicine

## 2011-07-17 ENCOUNTER — Ambulatory Visit: Payer: 59 | Admitting: Family Medicine

## 2011-07-17 ENCOUNTER — Encounter: Payer: Self-pay | Admitting: Family Medicine

## 2011-07-17 VITALS — BP 128/64 | HR 120 | Resp 16 | Ht 63.0 in | Wt 203.0 lb

## 2011-07-17 DIAGNOSIS — F17200 Nicotine dependence, unspecified, uncomplicated: Secondary | ICD-10-CM

## 2011-07-17 DIAGNOSIS — IMO0002 Reserved for concepts with insufficient information to code with codable children: Secondary | ICD-10-CM

## 2011-07-17 DIAGNOSIS — E785 Hyperlipidemia, unspecified: Secondary | ICD-10-CM

## 2011-07-17 DIAGNOSIS — I10 Essential (primary) hypertension: Secondary | ICD-10-CM

## 2011-07-17 DIAGNOSIS — R5383 Other fatigue: Secondary | ICD-10-CM

## 2011-07-17 DIAGNOSIS — Z23 Encounter for immunization: Secondary | ICD-10-CM

## 2011-07-17 DIAGNOSIS — R05 Cough: Secondary | ICD-10-CM

## 2011-07-17 DIAGNOSIS — R7301 Impaired fasting glucose: Secondary | ICD-10-CM

## 2011-07-17 DIAGNOSIS — R5381 Other malaise: Secondary | ICD-10-CM

## 2011-07-17 DIAGNOSIS — E669 Obesity, unspecified: Secondary | ICD-10-CM

## 2011-07-17 LAB — HEMOGLOBIN A1C
Hgb A1c MFr Bld: 6.1 % — ABNORMAL HIGH (ref <5.7)
Mean Plasma Glucose: 128 mg/dL — ABNORMAL HIGH (ref <117)

## 2011-07-17 LAB — COMPLETE METABOLIC PANEL WITH GFR
ALT: 12 U/L (ref 0–35)
AST: 17 U/L (ref 0–37)
Albumin: 5 g/dL (ref 3.5–5.2)
CO2: 31 mEq/L (ref 19–32)
Calcium: 9.7 mg/dL (ref 8.4–10.5)
Chloride: 103 mEq/L (ref 96–112)
Potassium: 5.1 mEq/L (ref 3.5–5.3)
Total Protein: 7.4 g/dL (ref 6.0–8.3)

## 2011-07-17 LAB — LIPID PANEL
LDL Cholesterol: 59 mg/dL (ref 0–99)
VLDL: 65 mg/dL — ABNORMAL HIGH (ref 0–40)

## 2011-07-17 MED ORDER — OMEPRAZOLE 40 MG PO CPDR: 40.0000 mg | DELAYED_RELEASE_CAPSULE | Freq: Every day | ORAL | Status: DC

## 2011-07-17 MED ORDER — TIZANIDINE HCL 4 MG PO TABS: ORAL_TABLET | ORAL | Status: DC

## 2011-07-17 MED ORDER — DILTIAZEM HCL ER COATED BEADS 120 MG PO CP24: 120.0000 mg | ORAL_CAPSULE | Freq: Every day | ORAL | Status: DC

## 2011-07-17 MED ORDER — POTASSIUM CHLORIDE 10 MEQ PO TBCR: 10.0000 meq | EXTENDED_RELEASE_TABLET | Freq: Every day | ORAL | Status: DC

## 2011-07-17 MED ORDER — HYDROCODONE-ACETAMINOPHEN 5-500 MG PO TABS: ORAL_TABLET | ORAL | Status: DC

## 2011-07-17 MED ORDER — ROSUVASTATIN CALCIUM 40 MG PO TABS: 40.0000 mg | ORAL_TABLET | Freq: Every day | ORAL | Status: DC

## 2011-07-17 MED ORDER — CELECOXIB 200 MG PO CAPS: ORAL_CAPSULE | ORAL | Status: DC

## 2011-07-17 MED ORDER — FUROSEMIDE 20 MG PO TABS: 20.0000 mg | ORAL_TABLET | Freq: Two times a day (BID) | ORAL | Status: DC

## 2011-07-17 MED ORDER — ALPRAZOLAM 1 MG PO TABS: 1.0000 mg | ORAL_TABLET | Freq: Two times a day (BID) | ORAL | Status: DC

## 2011-07-17 MED ORDER — LISINOPRIL 10 MG PO TABS: 10.0000 mg | ORAL_TABLET | Freq: Every day | ORAL | Status: DC

## 2011-07-17 NOTE — Assessment & Plan Note (Signed)
Controlled, no change in medication  

## 2011-07-17 NOTE — Assessment & Plan Note (Signed)
Increased triglycerides, has been over eating fried chicken in the past week, advised against this. Lipids otherwise are great, no med change

## 2011-07-17 NOTE — Patient Instructions (Addendum)
F/u in 4 months.  You need to quit smoking.  Congrats on weight loss. Keep it up, goal of  1.5 to 2 pounds per month  Pls stop fried foods, triglycerides were high  Pls consider starting water aerobics again.   Mammogram is way past due, last was 09/2009!!!!  Take only ONE potassium once daily please  fASTING LIPID, HEPATIC ,, CBC, TSH, CHEM 7, AND Hba1c IN 4 MONTHS

## 2011-07-17 NOTE — Assessment & Plan Note (Signed)
Deteriorated, pt encouraged to continue to watch carbs , start regular exercise and lose weight

## 2011-07-17 NOTE — Assessment & Plan Note (Signed)
Unchanged, reports improvement in lower extremity symptoms following thoracic surgery, does not want back surgery

## 2011-07-17 NOTE — Progress Notes (Signed)
  Subjective:    Patient ID: [name], female    DOB: 04/24/1946, 65 y.o.   MRN: [id number]  HPI The PT is here for follow up and re-evaluation of chronic medical conditions, medication management and review of any available recent lab and radiology data.  Preventive health is updated, specifically  Cancer screening and Immunization.   Questions or concerns regarding consultations or procedures which the PT has had in the interim are  addressed. The PT denies any adverse reactions to current medications since the last visit.  There are no new concerns.  There are no specific complaints       Review of Systems Denies recent fever or chills. Denies sinus pressure, nasal congestion, ear pain or sore throat. Denies chest congestion, productive cough or wheezing. Denies chest pains, palpitations and leg swelling Denies abdominal pain, nausea, vomiting,diarrhea or constipation.   Denies dysuria, frequency, hesitancy or incontinence. Denies joint pain, swelling and limitation in mobility. Denies headaches, seizures, numbness, or tingling. Denies depression, anxiety or insomnia. Denies skin break down or rash.        Objective:   Physical Exam Patient alert and oriented and in no cardiopulmonary distress.  HEENT: No facial asymmetry, EOMI, no sinus tenderness,  oropharynx pink and moist.  Neck supple no adenopathy.  Chest: Clear to auscultation bilaterally.Decreased air entry throughout  CVS: S1, S2 no murmurs, no S3.  ABD: Soft non tender. Bowel sounds normal.  Ext: No edema  MS: Adequate ROM spine, shoulders, hips and knees.  Skin: Intact, no ulcerations or rash noted.  Psych: Good eye contact, normal affect. Memory intact not anxious or depressed appearing.  CNS: CN 2-12 intact, power, tone and sensation normal throughout.        Assessment & Plan:

## 2011-07-17 NOTE — Assessment & Plan Note (Signed)
Smokes around 5 cigarettes daily, has no quit date in mind, counseled to quit

## 2011-07-17 NOTE — Assessment & Plan Note (Signed)
Improved. Pt applauded on succesful weight loss through lifestyle change, and encouraged to continue same. Weight loss goal set for the next several months.  

## 2011-07-20 ENCOUNTER — Telehealth: Payer: Self-pay | Admitting: Family Medicine

## 2011-07-20 DIAGNOSIS — R05 Cough: Secondary | ICD-10-CM | POA: Insufficient documentation

## 2011-07-20 MED ORDER — PROMETHAZINE-DM 6.25-15 MG/5ML PO SYRP: 5.0000 mL | ORAL_SOLUTION | Freq: Two times a day (BID) | ORAL | Status: DC | PRN

## 2011-07-20 NOTE — Telephone Encounter (Signed)
Real bad cough and her body is sore. Not coughing up anything but at night she wheezes some and really gets to coughing. Said you usually send her in cough med for this to [facility]

## 2011-07-20 NOTE — Telephone Encounter (Signed)
Called again and no answer. Left another message

## 2011-07-20 NOTE — Assessment & Plan Note (Signed)
C/o cough which is uncontrolled 2 days after visit, will send in tussionex

## 2011-07-20 NOTE — Telephone Encounter (Signed)
Phenergan dm has been sent in pls let her know

## 2011-07-20 NOTE — Telephone Encounter (Signed)
Called patient left message

## 2011-08-06 ENCOUNTER — Other Ambulatory Visit: Payer: Self-pay | Admitting: Family Medicine

## 2011-11-05 ENCOUNTER — Other Ambulatory Visit: Payer: Self-pay | Admitting: Family Medicine

## 2011-11-28 ENCOUNTER — Ambulatory Visit: Payer: 59 | Admitting: Family Medicine

## 2011-12-05 ENCOUNTER — Ambulatory Visit (INDEPENDENT_AMBULATORY_CARE_PROVIDER_SITE_OTHER): Payer: 59 | Admitting: Family Medicine

## 2011-12-05 ENCOUNTER — Encounter: Payer: Self-pay | Admitting: Family Medicine

## 2011-12-05 VITALS — BP 120/80 | HR 104 | Resp 15 | Ht 63.0 in | Wt 209.4 lb

## 2011-12-05 DIAGNOSIS — I10 Essential (primary) hypertension: Secondary | ICD-10-CM

## 2011-12-05 DIAGNOSIS — E559 Vitamin D deficiency, unspecified: Secondary | ICD-10-CM

## 2011-12-05 DIAGNOSIS — R5383 Other fatigue: Secondary | ICD-10-CM

## 2011-12-05 DIAGNOSIS — E669 Obesity, unspecified: Secondary | ICD-10-CM

## 2011-12-05 DIAGNOSIS — Z1211 Encounter for screening for malignant neoplasm of colon: Secondary | ICD-10-CM

## 2011-12-05 DIAGNOSIS — R7301 Impaired fasting glucose: Secondary | ICD-10-CM

## 2011-12-05 DIAGNOSIS — F17200 Nicotine dependence, unspecified, uncomplicated: Secondary | ICD-10-CM

## 2011-12-05 DIAGNOSIS — R5381 Other malaise: Secondary | ICD-10-CM

## 2011-12-05 LAB — CBC WITH DIFFERENTIAL/PLATELET
Basophils Absolute: 0 10*3/uL (ref 0.0–0.1)
Basophils Relative: 0 % (ref 0–1)
HCT: 41.5 % (ref 36.0–46.0)
Hemoglobin: 13.1 g/dL (ref 12.0–15.0)
Lymphocytes Relative: 51 % — ABNORMAL HIGH (ref 12–46)
Monocytes Absolute: 0.7 10*3/uL (ref 0.1–1.0)
Monocytes Relative: 10 % (ref 3–12)
Neutro Abs: 2.6 10*3/uL (ref 1.7–7.7)
Neutrophils Relative %: 37 % — ABNORMAL LOW (ref 43–77)
WBC: 7 10*3/uL (ref 4.0–10.5)

## 2011-12-05 LAB — POC HEMOCCULT BLD/STL (OFFICE/1-CARD/DIAGNOSTIC): Fecal Occult Blood, POC: NEGATIVE

## 2011-12-05 LAB — HEMOGLOBIN A1C: Mean Plasma Glucose: 126 mg/dL — ABNORMAL HIGH (ref <117)

## 2011-12-05 LAB — TSH: TSH: 2.098 u[IU]/mL (ref 0.350–4.500)

## 2011-12-05 NOTE — Progress Notes (Signed)
  Subjective:    Patient ID: [name], female    DOB: 18-Dec-1945, 66 y.o.   MRN: [id number]  HPI The PT is here for follow up and re-evaluation of chronic medical conditions, medication management and review of any available recent lab and radiology data.  Preventive health is updated, specifically  Cancer screening and Immunization.   Questions or concerns regarding consultations or procedures which the PT has had in the interim are  addressed. The PT denies any adverse reactions to current medications since the last visit.  There are no new concerns except for the fact that she has gained weigh, is not exercising and has continued smoking  There are no specific complaints       Review of Systems See HPI Denies recent fever or chills. Denies sinus pressure, nasal congestion, ear pain or sore throat. Denies chest congestion, productive cough or wheezing. Denies chest pains, palpitations and leg swelling Denies abdominal pain, nausea, vomiting,diarrhea or constipation.   Denies dysuria, frequency, hesitancy or incontinence. Denies joint pain, swelling and limitation in mobility. Denies headaches, seizures, numbness, or tingling. Denies depression, anxiety or insomnia. Denies skin break down or rash.        Objective:   Physical Exam  Patient alert and oriented and in no cardiopulmonary distress.  HEENT: No facial asymmetry, EOMI, no sinus tenderness,  oropharynx pink and moist.  Neck supple no adenopathy.  Chest: Clear to auscultation bilaterally.Decreased air entry throughout  CVS: S1, S2 no murmurs, no S3.  ABD: Soft non tender. Bowel sounds normal. Rectal: no mass, guaiac neg stool Ext: No edema  MS: Adequate ROM spine, shoulders, hips and knees.  Skin: Intact, no ulcerations or rash noted.  Psych: Good eye contact, normal affect. Memory intact not anxious or depressed appearing.  CNS: CN 2-12 intact, power, tone and sensation normal throughout.      Assessment & Plan:

## 2011-12-05 NOTE — Assessment & Plan Note (Signed)
hBa1C today and in 4 month  Counseled re diet and weight loss

## 2011-12-05 NOTE — Assessment & Plan Note (Signed)
Reports smoking one cig daily, encouraged strongly to quit today

## 2011-12-05 NOTE — Progress Notes (Signed)
Addended by: Abner Greenspan on: 12/05/2011 10:05 AM   Modules accepted: Orders

## 2011-12-05 NOTE — Assessment & Plan Note (Signed)
Controlled, no change in medication  

## 2011-12-05 NOTE — Progress Notes (Signed)
Addended by: Abner Greenspan on: 12/05/2011 12:49 PM   Modules accepted: Orders

## 2011-12-05 NOTE — Patient Instructions (Addendum)
F/u in 4 month  It is important that you exercise regularly at least 30 minutes 5 times a week. If you develop chest pain, have severe difficulty breathing, or feel very tired, stop exercising immediately and seek medical attention    A healthy diet is rich in fruit, vegetables and whole grains. Poultry fish, nuts and beans are a healthy choice for protein rather then red meat. A low sodium diet and drinking 64 ounces of water daily is generally recommended. Oils and sweet should be limited. Carbohydrates especially for those who are diabetic or overweight, should be limited to 34-45 gram per meal. It is important to eat on a regular schedule, at least 3 times daily. Snacks should be primarily fruits, vegetables or nuts.   Weight loss goal of 6 to 10 pounds    HBA1C, cbc , tsh today.  Fasting lipid, cmp, hBa1C in 4 month  Rectal today  Mammogram past due , please schedule

## 2011-12-05 NOTE — Assessment & Plan Note (Signed)
Deteriorated. Patient re-educated about  the importance of commitment to a  minimum of 150 minutes of exercise per week. The importance of healthy food choices with portion control discussed. Encouraged to start a food diary, count calories and to consider  joining a support group. Sample diet sheets offered. Goals set by the patient for the next several months.    

## 2011-12-10 ENCOUNTER — Other Ambulatory Visit: Payer: Self-pay | Admitting: Family Medicine

## 2011-12-10 ENCOUNTER — Other Ambulatory Visit: Payer: Self-pay

## 2011-12-10 MED ORDER — DILTIAZEM HCL ER COATED BEADS 120 MG PO CP24: 120.0000 mg | ORAL_CAPSULE | Freq: Every day | ORAL | Status: DC

## 2011-12-10 MED ORDER — HYDROCODONE-ACETAMINOPHEN 5-500 MG PO TABS: ORAL_TABLET | ORAL | Status: DC

## 2011-12-10 MED ORDER — ALPRAZOLAM 1 MG PO TABS: 1.0000 mg | ORAL_TABLET | Freq: Two times a day (BID) | ORAL | Status: DC

## 2011-12-10 MED ORDER — LISINOPRIL 10 MG PO TABS: 10.0000 mg | ORAL_TABLET | Freq: Every day | ORAL | Status: DC

## 2011-12-10 MED ORDER — ROSUVASTATIN CALCIUM 40 MG PO TABS: 40.0000 mg | ORAL_TABLET | Freq: Every day | ORAL | Status: DC

## 2011-12-10 NOTE — Telephone Encounter (Signed)
Pt seen in office of Wed. meds were not filled. Given 30 day supply of her xanax and vicodin. PCP will give refills .

## 2011-12-20 ENCOUNTER — Telehealth: Payer: Self-pay | Admitting: Family Medicine

## 2011-12-21 NOTE — Telephone Encounter (Signed)
meds sent

## 2011-12-24 ENCOUNTER — Telehealth: Payer: Self-pay | Admitting: Family Medicine

## 2011-12-24 ENCOUNTER — Other Ambulatory Visit: Payer: Self-pay

## 2011-12-24 MED ORDER — HYDROCODONE-ACETAMINOPHEN 5-500 MG PO TABS: ORAL_TABLET | ORAL | Status: DC

## 2011-12-24 MED ORDER — ALPRAZOLAM 1 MG PO TABS: 1.0000 mg | ORAL_TABLET | Freq: Two times a day (BID) | ORAL | Status: DC

## 2011-12-24 MED ORDER — OMEPRAZOLE 40 MG PO CPDR: 40.0000 mg | DELAYED_RELEASE_CAPSULE | Freq: Every day | ORAL | Status: DC

## 2011-12-24 NOTE — Telephone Encounter (Signed)
Spoke with pt and she was just requesting more refills to last until next appointment.  Request granted.

## 2012-02-04 ENCOUNTER — Other Ambulatory Visit: Payer: Self-pay | Admitting: Family Medicine

## 2012-02-06 ENCOUNTER — Other Ambulatory Visit: Payer: Self-pay | Admitting: Family Medicine

## 2012-03-05 ENCOUNTER — Other Ambulatory Visit: Payer: Self-pay | Admitting: Family Medicine

## 2012-03-31 ENCOUNTER — Other Ambulatory Visit: Payer: Self-pay | Admitting: Family Medicine

## 2012-04-03 ENCOUNTER — Other Ambulatory Visit: Payer: Self-pay | Admitting: Family Medicine

## 2012-04-07 ENCOUNTER — Telehealth: Payer: Self-pay | Admitting: Family Medicine

## 2012-04-07 ENCOUNTER — Ambulatory Visit: Payer: 59 | Admitting: Family Medicine

## 2012-04-07 ENCOUNTER — Other Ambulatory Visit: Payer: Self-pay

## 2012-04-07 MED ORDER — DILTIAZEM HCL ER COATED BEADS 120 MG PO CP24: 120.0000 mg | ORAL_CAPSULE | Freq: Every day | ORAL | Status: DC

## 2012-04-07 NOTE — Telephone Encounter (Signed)
Med refilled.

## 2012-04-28 ENCOUNTER — Encounter: Payer: Self-pay | Admitting: Family Medicine

## 2012-04-28 ENCOUNTER — Ambulatory Visit (INDEPENDENT_AMBULATORY_CARE_PROVIDER_SITE_OTHER): Payer: 59 | Admitting: Family Medicine

## 2012-04-28 VITALS — BP 140/80 | HR 110 | Resp 18 | Ht 63.0 in | Wt 207.1 lb

## 2012-04-28 DIAGNOSIS — R7301 Impaired fasting glucose: Secondary | ICD-10-CM

## 2012-04-28 DIAGNOSIS — I10 Essential (primary) hypertension: Secondary | ICD-10-CM

## 2012-04-28 DIAGNOSIS — Z23 Encounter for immunization: Secondary | ICD-10-CM

## 2012-04-28 DIAGNOSIS — F411 Generalized anxiety disorder: Secondary | ICD-10-CM

## 2012-04-28 DIAGNOSIS — F17200 Nicotine dependence, unspecified, uncomplicated: Secondary | ICD-10-CM

## 2012-04-28 DIAGNOSIS — E785 Hyperlipidemia, unspecified: Secondary | ICD-10-CM

## 2012-04-28 LAB — LIPID PANEL
LDL Cholesterol: 44 mg/dL (ref 0–99)
Triglycerides: 199 mg/dL — ABNORMAL HIGH (ref <150)
VLDL: 40 mg/dL (ref 0–40)

## 2012-04-28 LAB — COMPREHENSIVE METABOLIC PANEL
ALT: 12 U/L (ref 0–35)
AST: 12 U/L (ref 0–37)
Alkaline Phosphatase: 78 U/L (ref 39–117)
Calcium: 9.8 mg/dL (ref 8.4–10.5)
Chloride: 104 mEq/L (ref 96–112)
Creat: 1 mg/dL (ref 0.50–1.10)
Potassium: 4.2 mEq/L (ref 3.5–5.3)

## 2012-04-28 MED ORDER — HYDROCODONE-ACETAMINOPHEN 5-500 MG PO TABS: ORAL_TABLET | ORAL | Status: DC

## 2012-04-28 MED ORDER — CELECOXIB 200 MG PO CAPS: 200.0000 mg | ORAL_CAPSULE | Freq: Two times a day (BID) | ORAL | Status: DC

## 2012-04-28 MED ORDER — OMEPRAZOLE 40 MG PO CPDR: 40.0000 mg | DELAYED_RELEASE_CAPSULE | Freq: Every day | ORAL | Status: DC

## 2012-04-28 MED ORDER — TRAMADOL HCL 50 MG PO TABS: 50.0000 mg | ORAL_TABLET | Freq: Two times a day (BID) | ORAL | Status: DC

## 2012-04-28 MED ORDER — FUROSEMIDE 20 MG PO TABS: 20.0000 mg | ORAL_TABLET | Freq: Two times a day (BID) | ORAL | Status: DC

## 2012-04-28 MED ORDER — ALPRAZOLAM 1 MG PO TABS: 1.0000 mg | ORAL_TABLET | Freq: Two times a day (BID) | ORAL | Status: DC

## 2012-04-28 NOTE — Assessment & Plan Note (Signed)
Increased anxiety in the office, noted with elevated pulse , also higher today, rushing and overslept

## 2012-04-28 NOTE — Assessment & Plan Note (Addendum)
Patient counseled for approximately 5 minutes regarding the health risks of ongoing nicotine use, specifically all types of cancer, heart disease, stroke and respiratory failure. The options available for help with cessation ,the behavioral changes to assist the process, and the option to either gradully reduce usage  Or abruptly stop.is also discussed. Pt is also encouraged to set specific goals in number of cigarettes used daily, as well as to set a quit date. I suggested a chest CT scan and pt refused, stated she would stop smoking today!

## 2012-04-28 NOTE — Assessment & Plan Note (Signed)
Elevated systolic pressure. DASH diet and commitment to daily physical activity for a minimum of 30 minutes discussed and encouraged, as a part of hypertension management. The importance of attaining a healthy weight is also discussed. Nicotine cessation stressed. No med change

## 2012-04-28 NOTE — Patient Instructions (Addendum)
F/u in  Early  January. Please call if you need me before.  TODAY is your day to stop smoking, since you do not want the chest CT scan I recommended Mammogram is past due , you need to schedule this please  It is important that you exercise regularly at least 30 minutes 5 times a week. If you develop chest pain, have severe difficulty breathing, or feel very tired, stop exercising immediately and seek medical attention  .A healthy diet is rich in fruit, vegetables and whole grains. Poultry fish, nuts and beans are a healthy choice for protein rather then red meat. A low sodium diet and drinking 64 ounces of water daily is generally recommended. Oils and sweet should be limited. Carbohydrates especially for those who are diabetic or overweight, should be limited to 34-45 gram per meal. It is important to eat on a regular schedule, at least 3 times daily. Snacks should be primarily fruits, vegetables or nuts. Continue to work on weight loss, this is important for your health since you are prediabetic We will give you the info on free classes for prediabetic and diabeticetic. Weight loss goal of 1.5 to 2 pounds per month  hBA1C , fasting lipid, cmp today  hBa1c  Non fasting in january

## 2012-04-28 NOTE — Assessment & Plan Note (Signed)
Patient educated about the importance of limiting  Carbohydrate intake , the need to commit to daily physical activity for a minimum of 30 minutes , and to commit weight loss. The fact that changes in all these areas will reduce or eliminate all together the development of diabetes is stressed.    

## 2012-04-28 NOTE — Progress Notes (Signed)
  Subjective:    Patient ID: [name], female    DOB: August 11, 1946, 66 y.o.   MRN: [id number]  HPI The PT is here for follow up and re-evaluation of chronic medical conditions, medication management and review of any available recent lab and radiology data.  Preventive health is updated, specifically  Cancer screening and Immunization.   Questions or concerns regarding consultations or procedures which the PT has had in the interim are  addressed. The PT denies any adverse reactions to current medications since the last visit.  There are no new concerns.  There are no specific complaints       Review of Systems See HPI Denies recent fever or chills. Denies sinus pressure, nasal congestion, ear pain or sore throat. Denies chest congestion, productive cough or wheezing. Denies chest pains, palpitations and leg swelling Denies abdominal pain, nausea, vomiting,diarrhea or constipation.   Denies dysuria, frequency, hesitancy or incontinence. Denies joint pain, swelling and limitation in mobility. Denies headaches, seizures, numbness, or tingling. Denies depression, anxiety or insomnia. Denies skin break down or rash.        Objective:   Physical Exam  Patient alert and oriented and in no cardiopulmonary distress.  HEENT: No facial asymmetry, EOMI, no sinus tenderness,  oropharynx pink and moist.  Neck supple no adenopathy.  Chest: Clear to auscultation bilaterally.  CVS: S1, S2 no murmurs, no S3.  ABD: Soft non tender. Bowel sounds normal.  Ext: No edema  MS: Adequate ROM spine, shoulders, hips and knees.  Skin: Intact, no ulcerations or rash noted.  Psych: Good eye contact, normal affect. Memory intact not anxious or depressed appearing.  CNS: CN 2-12 intact, power, tone and sensation normal throughout.       Assessment & Plan:

## 2012-04-28 NOTE — Addendum Note (Signed)
Addended by: Kandis Fantasia B on: 04/28/2012 05:31 PM   Modules accepted: Orders, Medications

## 2012-04-28 NOTE — Assessment & Plan Note (Signed)
Hyperlipidemia:Low fat diet discussed and encouraged.  Updated labs today will decide on control when available

## 2012-04-29 ENCOUNTER — Other Ambulatory Visit: Payer: Self-pay

## 2012-04-29 MED ORDER — TIZANIDINE HCL 4 MG PO TABS: ORAL_TABLET | ORAL | Status: DC

## 2012-04-29 MED ORDER — TRAMADOL HCL 50 MG PO TABS: 100.0000 mg | ORAL_TABLET | Freq: Two times a day (BID) | ORAL | Status: DC

## 2012-04-29 MED ORDER — EPINEPHRINE 0.3 MG/0.3ML IJ DEVI: 0.3000 mg | INTRAMUSCULAR | Status: DC

## 2012-05-16 ENCOUNTER — Other Ambulatory Visit: Payer: Self-pay

## 2012-05-16 ENCOUNTER — Telehealth: Payer: Self-pay | Admitting: Family Medicine

## 2012-05-16 MED ORDER — FUROSEMIDE 20 MG PO TABS: 20.0000 mg | ORAL_TABLET | Freq: Two times a day (BID) | ORAL | Status: DC

## 2012-05-16 MED ORDER — OMEPRAZOLE 40 MG PO CPDR: 40.0000 mg | DELAYED_RELEASE_CAPSULE | Freq: Every day | ORAL | Status: DC

## 2012-05-16 MED ORDER — DILTIAZEM HCL ER COATED BEADS 120 MG PO CP24: 120.0000 mg | ORAL_CAPSULE | Freq: Every day | ORAL | Status: DC

## 2012-05-16 MED ORDER — CELECOXIB 200 MG PO CAPS: 200.0000 mg | ORAL_CAPSULE | Freq: Two times a day (BID) | ORAL | Status: DC

## 2012-05-16 MED ORDER — ALPRAZOLAM 1 MG PO TABS: 1.0000 mg | ORAL_TABLET | Freq: Two times a day (BID) | ORAL | Status: DC

## 2012-05-16 MED ORDER — POTASSIUM CHLORIDE CRYS ER 20 MEQ PO TBCR: EXTENDED_RELEASE_TABLET | ORAL | Status: DC

## 2012-05-16 MED ORDER — ROSUVASTATIN CALCIUM 40 MG PO TABS: ORAL_TABLET | ORAL | Status: DC

## 2012-05-16 MED ORDER — LISINOPRIL 10 MG PO TABS: ORAL_TABLET | ORAL | Status: DC

## 2012-05-16 NOTE — Telephone Encounter (Signed)
Only one month supplies at a time on both hydrocodone and zanaflex also on the xanax please explain this to them so they understand

## 2012-05-16 NOTE — Telephone Encounter (Signed)
[place] and [name] are about to lose their insurance and wants 90 day supply on all meds. I sent in everything but the hydrocodone and zanaflex. Is that too many pills to get at once or what do you want me to do? Both are tid

## 2012-05-19 NOTE — Telephone Encounter (Signed)
Called and spoke with Robert.

## 2012-05-20 ENCOUNTER — Telehealth: Payer: Self-pay

## 2012-05-20 MED ORDER — FUROSEMIDE 20 MG PO TABS: 20.0000 mg | ORAL_TABLET | Freq: Two times a day (BID) | ORAL | Status: DC

## 2012-05-20 MED ORDER — DILTIAZEM HCL ER COATED BEADS 120 MG PO CP24: 120.0000 mg | ORAL_CAPSULE | Freq: Every day | ORAL | Status: DC

## 2012-05-20 MED ORDER — ALPRAZOLAM 1 MG PO TABS: 1.0000 mg | ORAL_TABLET | Freq: Two times a day (BID) | ORAL | Status: DC

## 2012-05-20 MED ORDER — TIZANIDINE HCL 4 MG PO TABS: ORAL_TABLET | ORAL | Status: DC

## 2012-05-20 MED ORDER — LISINOPRIL 10 MG PO TABS: ORAL_TABLET | ORAL | Status: DC

## 2012-05-20 MED ORDER — CELECOXIB 200 MG PO CAPS: 200.0000 mg | ORAL_CAPSULE | Freq: Two times a day (BID) | ORAL | Status: DC

## 2012-05-20 MED ORDER — HYDROCODONE-ACETAMINOPHEN 5-500 MG PO TABS: ORAL_TABLET | ORAL | Status: DC

## 2012-05-20 MED ORDER — POTASSIUM CHLORIDE CRYS ER 20 MEQ PO TBCR: EXTENDED_RELEASE_TABLET | ORAL | Status: DC

## 2012-05-20 MED ORDER — ROSUVASTATIN CALCIUM 40 MG PO TABS: ORAL_TABLET | ORAL | Status: DC

## 2012-05-20 MED ORDER — TRAMADOL HCL 50 MG PO TABS: 100.0000 mg | ORAL_TABLET | Freq: Two times a day (BID) | ORAL | Status: DC

## 2012-05-20 MED ORDER — OMEPRAZOLE 40 MG PO CPDR: 40.0000 mg | DELAYED_RELEASE_CAPSULE | Freq: Every day | ORAL | Status: DC

## 2012-05-20 NOTE — Telephone Encounter (Signed)
Please send in 3 month supply of both, let her know that  Have done this, needs t take the meds as prescribed, no early fills, ensure no one "gets" them, will need a police report if there is a loss.Tell her I understand her situation this is why I am doing this but she also needs to hear it is a bIG resposibility for her to have such a large number of pills of this nature in her possession at one time

## 2012-05-20 NOTE — Telephone Encounter (Signed)
Sent in. Pt aware. 

## 2012-05-20 NOTE — Telephone Encounter (Signed)
Said she is begging you because once their cobra runs out she will be completely unable to get her pain meds and she spoke with the pharmacy and they said that they could do 3 months if they got authorization from the Dr and she said she would not be asking but she is desperate and don't want to go without it. Said to please reconsider. She only has 1 1/2 weeks left of insurance. Hydrocodone and zanaflex

## 2012-08-18 ENCOUNTER — Ambulatory Visit: Payer: 59 | Admitting: Family Medicine

## 2012-09-02 ENCOUNTER — Emergency Department (HOSPITAL_COMMUNITY): Admission: EM | Admit: 2012-09-02 | Discharge: 2012-09-02 | Discharge status: Against Medical Advice | Payer: Self-pay

## 2012-10-28 ENCOUNTER — Other Ambulatory Visit: Payer: Self-pay | Admitting: Family Medicine

## 2012-10-28 ENCOUNTER — Telehealth: Payer: Self-pay | Admitting: Family Medicine

## 2012-10-28 NOTE — Telephone Encounter (Signed)
Can this patient have refills on these meds?  I see where she was to followup in January.

## 2012-10-28 NOTE — Telephone Encounter (Signed)
pls send in 2 months only, she needs to make and keep anppt with me in may has not been here since Sept 2013 and no appts in the system,  Will need fasting lipid , cmp before visit pls order and let her know

## 2012-10-29 ENCOUNTER — Telehealth: Payer: Self-pay

## 2012-10-29 ENCOUNTER — Other Ambulatory Visit: Payer: Self-pay

## 2012-10-29 DIAGNOSIS — I10 Essential (primary) hypertension: Secondary | ICD-10-CM

## 2012-10-29 DIAGNOSIS — R7301 Impaired fasting glucose: Secondary | ICD-10-CM

## 2012-10-29 MED ORDER — ROSUVASTATIN CALCIUM 40 MG PO TABS: ORAL_TABLET | ORAL | Status: DC

## 2012-10-29 MED ORDER — CELECOXIB 200 MG PO CAPS: 200.0000 mg | ORAL_CAPSULE | Freq: Two times a day (BID) | ORAL | Status: DC

## 2012-10-29 MED ORDER — HYDROCODONE-ACETAMINOPHEN 5-500 MG PO TABS: ORAL_TABLET | ORAL | Status: DC

## 2012-10-29 MED ORDER — LISINOPRIL 10 MG PO TABS: ORAL_TABLET | ORAL | Status: DC

## 2012-10-29 MED ORDER — TIZANIDINE HCL 4 MG PO TABS: ORAL_TABLET | ORAL | Status: DC

## 2012-10-29 MED ORDER — DILTIAZEM HCL ER COATED BEADS 120 MG PO CP24: 120.0000 mg | ORAL_CAPSULE | Freq: Every day | ORAL | Status: DC

## 2012-10-29 NOTE — Telephone Encounter (Signed)
Pt aware and will schedule. meds refilled x 2

## 2012-10-29 NOTE — Telephone Encounter (Signed)
Labs ordered to be done before next appt

## 2012-11-08 LAB — COMPREHENSIVE METABOLIC PANEL
AST: 13 U/L (ref 0–37)
Alkaline Phosphatase: 74 U/L (ref 39–117)
Glucose, Bld: 121 mg/dL — ABNORMAL HIGH (ref 70–99)
Sodium: 139 mEq/L (ref 135–145)
Total Bilirubin: 0.4 mg/dL (ref 0.3–1.2)
Total Protein: 6.5 g/dL (ref 6.0–8.3)

## 2012-11-08 LAB — LIPID PANEL
HDL: 41 mg/dL (ref >39)
LDL Cholesterol: 35 mg/dL (ref 0–99)
Total CHOL/HDL Ratio: 2.6 Ratio
VLDL: 30 mg/dL (ref 0–40)

## 2012-11-11 ENCOUNTER — Other Ambulatory Visit: Payer: Self-pay | Admitting: Family Medicine

## 2012-11-11 ENCOUNTER — Ambulatory Visit (INDEPENDENT_AMBULATORY_CARE_PROVIDER_SITE_OTHER): Payer: BC Managed Care – PPO | Admitting: Family Medicine

## 2012-11-11 ENCOUNTER — Other Ambulatory Visit: Payer: Self-pay

## 2012-11-11 VITALS — BP 136/72 | HR 100 | Resp 18 | Ht 63.0 in | Wt 210.1 lb

## 2012-11-11 DIAGNOSIS — I10 Essential (primary) hypertension: Secondary | ICD-10-CM

## 2012-11-11 DIAGNOSIS — E785 Hyperlipidemia, unspecified: Secondary | ICD-10-CM

## 2012-11-11 DIAGNOSIS — R7301 Impaired fasting glucose: Secondary | ICD-10-CM

## 2012-11-11 DIAGNOSIS — E669 Obesity, unspecified: Secondary | ICD-10-CM

## 2012-11-11 DIAGNOSIS — R5383 Other fatigue: Secondary | ICD-10-CM

## 2012-11-11 DIAGNOSIS — F17200 Nicotine dependence, unspecified, uncomplicated: Secondary | ICD-10-CM

## 2012-11-11 DIAGNOSIS — R5381 Other malaise: Secondary | ICD-10-CM

## 2012-11-11 DIAGNOSIS — K219 Gastro-esophageal reflux disease without esophagitis: Secondary | ICD-10-CM

## 2012-11-11 DIAGNOSIS — J309 Allergic rhinitis, unspecified: Secondary | ICD-10-CM | POA: Insufficient documentation

## 2012-11-11 MED ORDER — FLUTICASONE PROPIONATE 50 MCG/ACT NA SUSP: 1.0000 | Freq: Every day | NASAL | Status: DC

## 2012-11-11 MED ORDER — TRAMADOL HCL 50 MG PO TABS: 100.0000 mg | ORAL_TABLET | Freq: Two times a day (BID) | ORAL | Status: DC

## 2012-11-11 MED ORDER — ALPRAZOLAM 1 MG PO TABS: 1.0000 mg | ORAL_TABLET | Freq: Two times a day (BID) | ORAL | Status: DC

## 2012-11-11 NOTE — Patient Instructions (Addendum)
F/u with rectal exam in 4.5 month, please call if you need me before.  I am very sorry to learn of your recent loss, and pray that you will be comforted over time. Cholesterol, liver and kidney function are all good. Blood sugar has increased.  Please work on reducing intake of sweets and starchy foods and weight loss, also stopping smoking in the next 4.5 month, this will all; improve your health  Mammogram past due please schedule  Nasal spray for daily use for allergies prescribed, Ok to take sudafed one daily , as needed, for excessive nasal drainage  Non fasting CBC, tSh and HBA1C and chem 7 in 4.5 month

## 2012-11-16 ENCOUNTER — Encounter: Payer: Self-pay | Admitting: Family Medicine

## 2012-11-16 NOTE — Assessment & Plan Note (Signed)
Controlled, no change in medication  

## 2012-11-16 NOTE — Progress Notes (Signed)
  Subjective:    Patient ID: [name], female    DOB: 07-Mar-1946, 66 y.o.   MRN: [id number]  HPI The PT is here for follow up and re-evaluation of chronic medical conditions, medication management and review of any available recent lab and radiology data.  Preventive health is updated, specifically  Cancer screening and Immunization.  Mammogram needs to be done Questions or concerns regarding consultations or procedures which the PT has had in the interim are  addressed. The PT denies any adverse reactions to current medications since the last visit.  Recently lost a sibling unexpectedly, in the past month, she is grieving the loss , as is to be expected, does not feel the need for counselling      Review of Systems See HPI Denies recent fever or chills. Denies sinus pressure, nasal congestion, ear pain or sore throat. Denies chest congestion, productive cough or wheezing. Denies chest pains, palpitations and leg swelling Denies abdominal pain, nausea, vomiting,diarrhea or constipation.   Denies dysuria, frequency, hesitancy or incontinence. Denies joint pain, swelling and limitation in mobility. Denies headaches, seizures, numbness, or tingling.  Denies skin break down or rash.        Objective:   Physical Exam  Patient alert and oriented and in no cardiopulmonary distress.  HEENT: No facial asymmetry, EOMI, no sinus tenderness,  oropharynx pink and moist.  Neck supple no adenopathy.  Chest: Clear to auscultation bilaterally.  CVS: S1, S2 no murmurs, no S3.  ABD: Soft non tender. Bowel sounds normal.  Ext: No edema  MS: Adequate ROM spine, shoulders, hips and knees.  Skin: Intact, no ulcerations or rash noted.  Psych: Good eye contact, normal affect. Memory intact not anxious or depressed appearing.  CNS: CN 2-12 intact, power, tone and sensation normal throughout.       Assessment & Plan:

## 2012-11-16 NOTE — Assessment & Plan Note (Signed)
Hyperlipidemia:Low fat diet discussed and encouraged.  Controlled, no change in medication   

## 2012-11-16 NOTE — Assessment & Plan Note (Signed)

## 2012-11-16 NOTE — Assessment & Plan Note (Signed)
Controlled, no change in medication DASH diet and commitment to daily physical activity for a minimum of 30 minutes discussed and encouraged, as a part of hypertension management. The importance of attaining a healthy weight is also discussed.  

## 2012-11-16 NOTE — Assessment & Plan Note (Signed)
Deteriorated Patient educated about the importance of limiting  Carbohydrate intake , the need to commit to daily physical activity for a minimum of 30 minutes , and to commit weight loss. The fact that changes in all these areas will reduce or eliminate all together the development of diabetes is stressed.   Updated lab next visit

## 2012-11-16 NOTE — Assessment & Plan Note (Signed)
Increased symptoms , flonase prescribed 

## 2012-11-16 NOTE — Assessment & Plan Note (Signed)
Deteriorated. Patient re-educated about  the importance of commitment to a  minimum of 150 minutes of exercise per week. The importance of healthy food choices with portion control discussed. Encouraged to start a food diary, count calories and to consider  joining a support group. Sample diet sheets offered. Goals set by the patient for the next several months.    

## 2012-11-25 ENCOUNTER — Other Ambulatory Visit: Payer: Self-pay

## 2012-11-25 MED ORDER — OMEPRAZOLE 40 MG PO CPDR: 40.0000 mg | DELAYED_RELEASE_CAPSULE | Freq: Every day | ORAL | Status: DC

## 2012-11-25 MED ORDER — FUROSEMIDE 20 MG PO TABS: 20.0000 mg | ORAL_TABLET | Freq: Two times a day (BID) | ORAL | Status: DC

## 2012-11-25 MED ORDER — POTASSIUM CHLORIDE CRYS ER 20 MEQ PO TBCR: EXTENDED_RELEASE_TABLET | ORAL | Status: DC

## 2012-12-10 ENCOUNTER — Other Ambulatory Visit: Payer: Self-pay | Admitting: Family Medicine

## 2012-12-10 ENCOUNTER — Other Ambulatory Visit: Payer: Self-pay

## 2012-12-10 ENCOUNTER — Telehealth: Payer: Self-pay | Admitting: Family Medicine

## 2012-12-10 MED ORDER — ALPRAZOLAM 1 MG PO TABS: 1.0000 mg | ORAL_TABLET | Freq: Two times a day (BID) | ORAL | Status: DC

## 2012-12-10 NOTE — Telephone Encounter (Signed)
Med printed for signature 

## 2012-12-25 ENCOUNTER — Other Ambulatory Visit: Payer: Self-pay | Admitting: Family Medicine

## 2012-12-25 MED ORDER — ROSUVASTATIN CALCIUM 40 MG PO TABS: ORAL_TABLET | ORAL | Status: DC

## 2012-12-25 MED ORDER — DILTIAZEM HCL ER COATED BEADS 120 MG PO CP24: 120.0000 mg | ORAL_CAPSULE | Freq: Every day | ORAL | Status: DC

## 2012-12-25 MED ORDER — TIZANIDINE HCL 4 MG PO TABS: ORAL_TABLET | ORAL | Status: DC

## 2012-12-25 MED ORDER — FUROSEMIDE 20 MG PO TABS: 20.0000 mg | ORAL_TABLET | Freq: Two times a day (BID) | ORAL | Status: DC

## 2012-12-25 MED ORDER — LISINOPRIL 10 MG PO TABS: ORAL_TABLET | ORAL | Status: DC

## 2012-12-25 MED ORDER — CELECOXIB 200 MG PO CAPS: 200.0000 mg | ORAL_CAPSULE | Freq: Two times a day (BID) | ORAL | Status: DC

## 2012-12-25 NOTE — Telephone Encounter (Signed)
PATIENT AWARE MEDS SENT

## 2013-01-23 ENCOUNTER — Other Ambulatory Visit: Payer: Self-pay

## 2013-01-23 MED ORDER — TRAMADOL HCL 50 MG PO TABS: 100.0000 mg | ORAL_TABLET | Freq: Two times a day (BID) | ORAL | Status: DC

## 2013-03-02 ENCOUNTER — Telehealth: Payer: Self-pay | Admitting: Family Medicine

## 2013-03-02 NOTE — Telephone Encounter (Signed)
Please advise.  Muscle strain?

## 2013-03-02 NOTE — Telephone Encounter (Signed)
Offered appt for this week.  She states that she will try otc muscle rub first.  She has an appt in August already.  She also states that she takes the Zanaflex daily and at times 3x daily as prescribed.

## 2013-03-02 NOTE — Telephone Encounter (Signed)
Already has muscle relaxant zannaflex, may use OTC muscle rub also like myoflex, offer appt  Today or this week if not getting sufficient relief/improvement , will need OV

## 2013-03-05 ENCOUNTER — Other Ambulatory Visit: Payer: Self-pay | Admitting: Family Medicine

## 2013-03-05 ENCOUNTER — Telehealth: Payer: Self-pay | Admitting: Family Medicine

## 2013-03-05 NOTE — Telephone Encounter (Signed)
meds refilled to last until sept appt

## 2013-03-10 ENCOUNTER — Ambulatory Visit (INDEPENDENT_AMBULATORY_CARE_PROVIDER_SITE_OTHER): Payer: BC Managed Care – PPO | Admitting: Family Medicine

## 2013-03-10 ENCOUNTER — Encounter: Payer: Self-pay | Admitting: Family Medicine

## 2013-03-10 VITALS — BP 134/68 | HR 100 | Resp 20 | Wt 208.1 lb

## 2013-03-10 DIAGNOSIS — N8110 Cystocele, unspecified: Secondary | ICD-10-CM | POA: Insufficient documentation

## 2013-03-10 DIAGNOSIS — I10 Essential (primary) hypertension: Secondary | ICD-10-CM

## 2013-03-10 DIAGNOSIS — N8111 Cystocele, midline: Secondary | ICD-10-CM

## 2013-03-10 NOTE — Progress Notes (Signed)
  Subjective:    Patient ID: [name], female    DOB: 02-Mar-1946, 67 y.o.   MRN: [id number]  HPI Pt has started exercise progeram since June. Has had pelvic pressure and heaviness in vaginal vault. One day last week when she felt as though needed to hav a bM instead of stool, none came and without any pressure she felt something fall inside of her . Has fullness in pelvis with increased urinary frequency, no fever or chills. She is absolutely terrified, as this has reminded he of a pregnancyt loss at 6 month. She is s/p TAH   Review of Systems See HPI Denies recent fever or chills. Denies sinus pressure, nasal congestion, ear pain or sore throat. Denies chest congestion, productive cough or wheezing. Denies chest pains, palpitations and leg swelling Denies abdominal pain, nausea, vomiting,diarrhea or constipation.         Objective:   Physical Exam  Patient alert and oriented and in no cardiopulmonary distress.Anxious and tearful and frightened  HEENT: No facial asymmetry, EOMI, no sinus tenderness,  oropharynx pink and moist.  Neck supple no adenopathy.  Chest: Clear to auscultation bilaterally.  CVS: S1, S2 no murmurs, no S3.  ABD: Soft non tender. Bowel sounds normal. Pelvic: external genitalia and vaginal vault normal, partial bladder prolapse noted Ext: No edema  MS: decreased  ROM spine,adequate in , shoulders, hips and knees.  Skin: Intact, no ulcerations or rash noted.  t.       Assessment & Plan:

## 2013-03-10 NOTE — Patient Instructions (Addendum)
F/u as before  You are referred to Dr. Wilson Singer re current problem of bladder prolapse

## 2013-03-15 NOTE — Assessment & Plan Note (Signed)
Controlled, no change in medication  

## 2013-03-15 NOTE — Assessment & Plan Note (Signed)
Acute episode of vaginal fullness, refer to urology for eval and management Pt is terrified , extent of  Prolapse that I observe is mild to moderate

## 2013-03-19 ENCOUNTER — Telehealth: Payer: Self-pay

## 2013-03-19 ENCOUNTER — Telehealth: Payer: Self-pay | Admitting: Family Medicine

## 2013-03-19 NOTE — Telephone Encounter (Signed)
Noted, thanks!

## 2013-03-19 NOTE — Telephone Encounter (Signed)
pls check for sooner appt with [facility]

## 2013-03-20 ENCOUNTER — Telehealth: Payer: Self-pay | Admitting: Family Medicine

## 2013-03-23 NOTE — Telephone Encounter (Signed)
LAB ORDER SENT

## 2013-03-23 NOTE — Telephone Encounter (Signed)
Patient is aware of this appointment

## 2013-03-24 NOTE — Telephone Encounter (Signed)
noted 

## 2013-03-27 LAB — BASIC METABOLIC PANEL
CO2: 31 mEq/L (ref 19–32)
Calcium: 9.8 mg/dL (ref 8.4–10.5)
Creat: 1.08 mg/dL (ref 0.50–1.10)
Glucose, Bld: 98 mg/dL (ref 70–99)

## 2013-03-27 LAB — CBC
MCH: 26.9 pg (ref 26.0–34.0)
MCHC: 32.1 g/dL (ref 30.0–36.0)
MCV: 83.6 fL (ref 78.0–100.0)
Platelets: 264 10*3/uL (ref 150–400)
RBC: 4.58 MIL/uL (ref 3.87–5.11)

## 2013-04-01 ENCOUNTER — Telehealth: Payer: Self-pay | Admitting: Family Medicine

## 2013-04-01 NOTE — Telephone Encounter (Signed)
Noted, I also note she will be seen in [place] office

## 2013-04-01 NOTE — Telephone Encounter (Signed)
After speaking  with patient asked her if she wanted to come to the [place] office and she stated yes so I called and scheduled her an appointment with Dr. Annabell Howells 9.26.2014 at 2:00. You dont have to call patient she is satisfied now unless you want to hear about her appointment in [facility] yesterday

## 2013-04-01 NOTE — Telephone Encounter (Signed)
Noted , thanks for listening

## 2013-04-03 ENCOUNTER — Telehealth: Payer: Self-pay | Admitting: Family Medicine

## 2013-04-03 ENCOUNTER — Ambulatory Visit: Payer: BC Managed Care – PPO | Admitting: Urology

## 2013-04-03 NOTE — Telephone Encounter (Signed)
Patient was supposed to go see Dr. Annabell Howells today and the office called and told her that Dr. Annabell Howells wants her to see Dr. Patsi Sears or Dr. Vilinda Blanks so I called and scheduled her an appointment for 9.9.2014 be there at 2:45 with Dr. Vilinda Blanks and patient is aware which patient had tried to scheduled

## 2013-04-19 ENCOUNTER — Other Ambulatory Visit: Payer: Self-pay | Admitting: Family Medicine

## 2013-04-22 ENCOUNTER — Telehealth: Payer: Self-pay | Admitting: Family Medicine

## 2013-04-22 ENCOUNTER — Ambulatory Visit (INDEPENDENT_AMBULATORY_CARE_PROVIDER_SITE_OTHER): Payer: BC Managed Care – PPO | Admitting: Family Medicine

## 2013-04-22 ENCOUNTER — Encounter: Payer: Self-pay | Admitting: Family Medicine

## 2013-04-22 VITALS — BP 140/74 | HR 105 | Resp 16 | Ht 63.0 in | Wt 210.4 lb

## 2013-04-22 DIAGNOSIS — F411 Generalized anxiety disorder: Secondary | ICD-10-CM

## 2013-04-22 DIAGNOSIS — Z1211 Encounter for screening for malignant neoplasm of colon: Secondary | ICD-10-CM

## 2013-04-22 DIAGNOSIS — E669 Obesity, unspecified: Secondary | ICD-10-CM

## 2013-04-22 DIAGNOSIS — F17200 Nicotine dependence, unspecified, uncomplicated: Secondary | ICD-10-CM

## 2013-04-22 DIAGNOSIS — I10 Essential (primary) hypertension: Secondary | ICD-10-CM

## 2013-04-22 DIAGNOSIS — R7301 Impaired fasting glucose: Secondary | ICD-10-CM

## 2013-04-22 DIAGNOSIS — IMO0002 Reserved for concepts with insufficient information to code with codable children: Secondary | ICD-10-CM

## 2013-04-22 DIAGNOSIS — Z1239 Encounter for other screening for malignant neoplasm of breast: Secondary | ICD-10-CM

## 2013-04-22 DIAGNOSIS — E785 Hyperlipidemia, unspecified: Secondary | ICD-10-CM

## 2013-04-22 DIAGNOSIS — Z23 Encounter for immunization: Secondary | ICD-10-CM

## 2013-04-22 LAB — POC HEMOCCULT BLD/STL (OFFICE/1-CARD/DIAGNOSTIC)

## 2013-04-22 MED ORDER — PHENTERMINE HCL 37.5 MG PO TABS: ORAL_TABLET | ORAL | Status: DC

## 2013-04-22 NOTE — Progress Notes (Signed)
  Subjective:    Patient ID: [name], female    DOB: 10/12/1945, 67 y.o.   MRN: [id number]  HPI The PT is here for follow up and re-evaluation of chronic medical conditions, medication management and review of any available recent lab and radiology data.  Preventive health is updated, specifically  Cancer screening and Immunization.   Questions or concerns regarding consultations or procedures which the PT has had in the interim are  Addressed.Happy with urology eval and the fact that she does not need intervention for mild prolapse The PT denies any adverse reactions to current medications since the last visit.  There are no new concerns.  C/o increased and uncontrolled low back pain, med management also discussed    Review of Systems See HPI Denies recent fever or chills. Denies sinus pressure, nasal congestion, ear pain or sore throat. Denies chest congestion, productive cough or wheezing. Denies chest pains, palpitations and leg swelling Denies abdominal pain, nausea, vomiting,diarrhea or constipation.   Denies dysuria, frequency, hesitancy or incontinence Denies headaches, seizures, numbness, or tingling. Denies uncontrolled , anxiety or insomnia or depression Denies skin break down or rash.        Objective:   Physical Exam  Patient alert and oriented and in no cardiopulmonary distress.  HEENT: No facial asymmetry, EOMI, no sinus tenderness,  oropharynx pink and moist.  Neck supple no adenopathy.  Chest: Clear to auscultation bilaterally.Decreased though adequate air entry  CVS: S1, S2 no murmurs, no S3.  ABD: Soft non tender. Bowel sounds normal. Rectal : no mass, heme negative stool Ext: No edema  MS: decreased  ROM spine,adequate in  shoulders, hips and knees.  Skin: Intact, no ulcerations or rash noted.  Psych: Good eye contact, normal affect. Memory intact not anxious or depressed appearing.  CNS: CN 2-12 intact, power, tone and sensation normal  throughout.       Assessment & Plan:

## 2013-04-22 NOTE — Patient Instructions (Addendum)
F/u  December 12 , call; if you need me before  Congrats, blood sugar is entirely normal  Definitely no more smoking after your birthday, I think that today is a GREAT day to quit  New is phentermine one daily to help with weight loss, goal is 4 to 5 pounds per week, must lose 10 pounds in next 3 month. This will help back pain   Referred for MRI back and will likely  be sent for epidural injections following the study, we will call with results.  STOP tramadol  Pls stop at front for mammogram appt date  Rectal exam today is normal Flu vaccine today It is important that you exercise regularly at least 30 minutes 5 times a week. If you develop chest pain, have severe difficulty breathing, or feel very tired, stop exercising immediately and seek medical attention  .ENJOY the [facility]

## 2013-04-22 NOTE — Telephone Encounter (Signed)
Will do!

## 2013-04-24 NOTE — Progress Notes (Signed)
FOB done in office was negative

## 2013-04-26 NOTE — Assessment & Plan Note (Signed)
improved now that urology has assured he no need for intervention

## 2013-04-26 NOTE — Assessment & Plan Note (Signed)
Improved, pt applauded on this and encouraged to continue low carb diet and work on weight loss and regular exercise

## 2013-04-26 NOTE — Assessment & Plan Note (Signed)
Worsened, discussed med management moving forward . Weight loss and regular exercise will benefit her and she is focussed on moving in this direction. Rept MRI lumbar spine

## 2013-04-26 NOTE — Assessment & Plan Note (Signed)
Controlled, no change in medication DASH diet and commitment to daily physical activity for a minimum of 30 minutes discussed and encouraged, as a part of hypertension management. The importance of attaining a healthy weight is also discussed.  

## 2013-04-26 NOTE — Assessment & Plan Note (Signed)
Cessation counseling done , ready to quit Patient counseled for approximately 5 minutes regarding the health risks of ongoing nicotine use, specifically all types of cancer, heart disease, stroke and respiratory failure. The options available for help with cessation ,the behavioral changes to assist the process, and the option to either gradully reduce usage  Or abruptly stop.is also discussed. Pt is also encouraged to set specific goals in number of cigarettes used daily, as well as to set a quit date.

## 2013-04-26 NOTE — Assessment & Plan Note (Signed)
Unchanged. Patient re-educated about  the importance of commitment to a  minimum of 150 minutes of exercise per week. The importance of healthy food choices with portion control discussed. Encouraged to start a food diary, count calories and to consider  joining a support group. Sample diet sheets offered. Goals set by the patient for the next several months.    

## 2013-04-26 NOTE — Assessment & Plan Note (Signed)
Controlled, no change in medication Hyperlipidemia:Low fat diet discussed and encouraged.  \ 

## 2013-05-01 ENCOUNTER — Ambulatory Visit (HOSPITAL_COMMUNITY): Payer: BC Managed Care – PPO

## 2013-05-06 ENCOUNTER — Telehealth: Payer: Self-pay | Admitting: Family Medicine

## 2013-05-06 ENCOUNTER — Other Ambulatory Visit: Payer: Self-pay

## 2013-05-06 MED ORDER — TIZANIDINE HCL 4 MG PO TABS: ORAL_TABLET | ORAL | Status: DC

## 2013-05-06 MED ORDER — CELECOXIB 200 MG PO CAPS: 200.0000 mg | ORAL_CAPSULE | Freq: Two times a day (BID) | ORAL | Status: DC

## 2013-05-06 MED ORDER — ROSUVASTATIN CALCIUM 40 MG PO TABS: ORAL_TABLET | ORAL | Status: DC

## 2013-05-06 MED ORDER — OMEPRAZOLE 40 MG PO CPDR: 40.0000 mg | DELAYED_RELEASE_CAPSULE | Freq: Every day | ORAL | Status: DC

## 2013-05-06 MED ORDER — POTASSIUM CHLORIDE CRYS ER 20 MEQ PO TBCR: EXTENDED_RELEASE_TABLET | ORAL | Status: DC

## 2013-05-06 MED ORDER — DILTIAZEM HCL ER COATED BEADS 120 MG PO CP24: 120.0000 mg | ORAL_CAPSULE | Freq: Every day | ORAL | Status: DC

## 2013-05-06 MED ORDER — FUROSEMIDE 20 MG PO TABS: 20.0000 mg | ORAL_TABLET | Freq: Two times a day (BID) | ORAL | Status: DC

## 2013-05-06 MED ORDER — LISINOPRIL 10 MG PO TABS: ORAL_TABLET | ORAL | Status: DC

## 2013-05-06 NOTE — Telephone Encounter (Signed)
Is leaving for [place] next month and coming to collect her meds. Wants to know since she no longer has the tramadol if you can increase her hydrocodone, (either the strength or the dosing). Will be here tomorrow to collect refills

## 2013-05-06 NOTE — Telephone Encounter (Signed)
See next telephone message. 

## 2013-05-06 NOTE — Telephone Encounter (Signed)
Called and left message with husband for patient to return call.  

## 2013-05-06 NOTE — Telephone Encounter (Signed)
At visit 2 weeks ago we discussed epidural injections following updated MRI back,( I am not sure why she still has not had it yet)  as THE WAY TO GO, she still needs to follow through, I really do not want to increase the vicodin dose, pls remind her of this, I reviewed her d/c from last visit and that was the plan

## 2013-05-07 ENCOUNTER — Other Ambulatory Visit: Payer: Self-pay

## 2013-05-07 MED ORDER — HYDROCODONE-ACETAMINOPHEN 5-325 MG PO TABS: ORAL_TABLET | ORAL | Status: DC

## 2013-05-07 NOTE — Telephone Encounter (Signed)
Will make patient aware when she comes to collect her rxs

## 2013-05-14 ENCOUNTER — Ambulatory Visit (HOSPITAL_COMMUNITY): Payer: BC Managed Care – PPO

## 2013-05-18 ENCOUNTER — Ambulatory Visit: Payer: BC Managed Care – PPO

## 2013-05-18 ENCOUNTER — Other Ambulatory Visit: Payer: BC Managed Care – PPO

## 2013-06-02 ENCOUNTER — Other Ambulatory Visit: Payer: Self-pay | Admitting: Family Medicine

## 2013-06-03 ENCOUNTER — Telehealth: Payer: Self-pay | Admitting: Family Medicine

## 2013-06-03 NOTE — Telephone Encounter (Signed)
Med printed and awaiting signature.  

## 2013-06-08 ENCOUNTER — Telehealth: Payer: Self-pay | Admitting: Family Medicine

## 2013-06-08 MED ORDER — HYDROCODONE-ACETAMINOPHEN 5-325 MG PO TABS: ORAL_TABLET | ORAL | Status: DC

## 2013-06-08 NOTE — Telephone Encounter (Signed)
Wants to pick up her hydrocodone. Will pick up tomorrow. Waiting for signature

## 2013-06-08 NOTE — Telephone Encounter (Signed)
rx ready to be collected

## 2013-07-07 ENCOUNTER — Other Ambulatory Visit: Payer: Self-pay

## 2013-07-07 MED ORDER — HYDROCODONE-ACETAMINOPHEN 5-325 MG PO TABS: ORAL_TABLET | ORAL | Status: DC

## 2013-07-14 ENCOUNTER — Telehealth: Payer: Self-pay | Admitting: *Deleted

## 2013-07-14 NOTE — Telephone Encounter (Signed)
Is there something else cheaper that she can get? (meloxicam, diclofenac and naproxen all on $4 list at [facility])

## 2013-07-14 NOTE — Telephone Encounter (Signed)
Pt called stating her insurance told her with the celebrex it is going to put her in the doughnut hole, pt would like to change rx to something that is just as good as the celebrex. Please advise [phone]

## 2013-07-15 MED ORDER — MELOXICAM 15 MG PO TABS: 15.0000 mg | ORAL_TABLET | Freq: Every day | ORAL | Status: DC

## 2013-07-15 NOTE — Addendum Note (Signed)
Addended by: Kandis Fantasia B on: 07/15/2013 04:41 PM   Modules accepted: Orders, Medications

## 2013-07-24 ENCOUNTER — Other Ambulatory Visit: Payer: Self-pay

## 2013-07-24 MED ORDER — HYDROCODONE-ACETAMINOPHEN 5-325 MG PO TABS: ORAL_TABLET | ORAL | Status: DC

## 2013-07-27 ENCOUNTER — Other Ambulatory Visit: Payer: Self-pay

## 2013-07-27 ENCOUNTER — Telehealth: Payer: Self-pay | Admitting: Family Medicine

## 2013-07-27 MED ORDER — FUROSEMIDE 20 MG PO TABS: 20.0000 mg | ORAL_TABLET | Freq: Two times a day (BID) | ORAL | Status: DC

## 2013-07-27 MED ORDER — ALPRAZOLAM 1 MG PO TABS: ORAL_TABLET | ORAL | Status: DC

## 2013-07-27 NOTE — Telephone Encounter (Signed)
meds refilled.   Xanax awaiting signature

## 2013-08-27 ENCOUNTER — Telehealth: Payer: Self-pay | Admitting: Family Medicine

## 2013-08-27 ENCOUNTER — Other Ambulatory Visit: Payer: Self-pay

## 2013-08-27 MED ORDER — TIZANIDINE HCL 4 MG PO TABS: ORAL_TABLET | ORAL | Status: DC

## 2013-08-27 MED ORDER — LISINOPRIL 10 MG PO TABS: ORAL_TABLET | ORAL | Status: DC

## 2013-08-27 MED ORDER — HYDROCODONE-ACETAMINOPHEN 5-325 MG PO TABS: ORAL_TABLET | ORAL | Status: DC

## 2013-08-27 MED ORDER — MELOXICAM 15 MG PO TABS: 15.0000 mg | ORAL_TABLET | Freq: Every day | ORAL | Status: DC

## 2013-08-27 MED ORDER — ALPRAZOLAM 1 MG PO TABS: ORAL_TABLET | ORAL | Status: DC

## 2013-08-27 MED ORDER — FUROSEMIDE 20 MG PO TABS
20.0000 mg | ORAL_TABLET | Freq: Two times a day (BID) | ORAL | Status: DC
Start: 2013-08-27 — End: 2013-11-05

## 2013-08-27 MED ORDER — ROSUVASTATIN CALCIUM 40 MG PO TABS: ORAL_TABLET | ORAL | Status: DC

## 2013-08-27 MED ORDER — DILTIAZEM HCL ER COATED BEADS 120 MG PO CP24: 120.0000 mg | ORAL_CAPSULE | Freq: Every day | ORAL | Status: DC

## 2013-08-27 MED ORDER — OMEPRAZOLE 40 MG PO CPDR
40.0000 mg | DELAYED_RELEASE_CAPSULE | Freq: Every day | ORAL | Status: DC
Start: 2013-08-27 — End: 2013-11-05

## 2013-08-27 NOTE — Telephone Encounter (Signed)
meds sent as requested 

## 2013-09-23 ENCOUNTER — Telehealth: Payer: Self-pay | Admitting: Family Medicine

## 2013-09-23 ENCOUNTER — Other Ambulatory Visit: Payer: Self-pay

## 2013-09-23 MED ORDER — HYDROCODONE-ACETAMINOPHEN 5-325 MG PO TABS: ORAL_TABLET | ORAL | Status: DC

## 2013-09-23 NOTE — Telephone Encounter (Signed)
Med sent in for 90 day supply in Jan

## 2013-10-16 ENCOUNTER — Other Ambulatory Visit: Payer: Self-pay

## 2013-10-16 ENCOUNTER — Telehealth: Payer: Self-pay

## 2013-10-16 MED ORDER — HYDROCODONE-ACETAMINOPHEN 5-325 MG PO TABS: ORAL_TABLET | ORAL | Status: DC

## 2013-10-16 NOTE — Telephone Encounter (Signed)
She needs to be clinically evaluated , I recommend urgent care or ED, she has been sick for many days, pls explain, cannot prescribe medication today for her, she needs to be evaluated by a clinician

## 2013-10-16 NOTE — Telephone Encounter (Signed)
Called and left message notifying patient.

## 2013-10-19 ENCOUNTER — Telehealth: Payer: Self-pay | Admitting: Family Medicine

## 2013-10-19 NOTE — Telephone Encounter (Signed)
Called back to see how patient was doing. She had called back after hours on Friday requesting antibiotic be sent to her pharmacy and also told me that no one from this office had returned her call.  Record review documents that she had been advised to seek help at urgent care of the ED based on how sick she was, she needed clinical evaluation. I call her home today to follow up, her husband answered, stated that she was seen today at ? Urgent care, and she has "been put to bed" , stated that he "had to get her out of here" this morning. Hopefully she is now finally being treated and will recover fully. I asked her spouse to let her know that i had called to enquire as to how she was doing

## 2013-10-20 ENCOUNTER — Other Ambulatory Visit: Payer: Self-pay | Admitting: Family Medicine

## 2013-10-28 ENCOUNTER — Ambulatory Visit (HOSPITAL_COMMUNITY)
Admission: RE | Admit: 2013-10-28 | Discharge: 2013-10-28 | Disposition: A | Discharge status: Home / Self Care | Payer: Medicare HMO | Source: Ambulatory Visit | Attending: Family Medicine | Admitting: Family Medicine

## 2013-10-28 ENCOUNTER — Ambulatory Visit (INDEPENDENT_AMBULATORY_CARE_PROVIDER_SITE_OTHER): Payer: Medicare HMO | Admitting: Family Medicine

## 2013-10-28 ENCOUNTER — Other Ambulatory Visit: Payer: Self-pay | Admitting: Family Medicine

## 2013-10-28 ENCOUNTER — Encounter: Payer: Self-pay | Admitting: Family Medicine

## 2013-10-28 ENCOUNTER — Telehealth: Payer: Self-pay | Admitting: Family Medicine

## 2013-10-28 VITALS — BP 126/78 | HR 118 | Temp 98.4°F | Resp 16 | Wt 198.8 lb

## 2013-10-28 DIAGNOSIS — J309 Allergic rhinitis, unspecified: Secondary | ICD-10-CM

## 2013-10-28 DIAGNOSIS — R7301 Impaired fasting glucose: Secondary | ICD-10-CM

## 2013-10-28 DIAGNOSIS — R05 Cough: Secondary | ICD-10-CM | POA: Insufficient documentation

## 2013-10-28 DIAGNOSIS — J209 Acute bronchitis, unspecified: Secondary | ICD-10-CM

## 2013-10-28 DIAGNOSIS — R059 Cough, unspecified: Secondary | ICD-10-CM | POA: Insufficient documentation

## 2013-10-28 DIAGNOSIS — Z1231 Encounter for screening mammogram for malignant neoplasm of breast: Secondary | ICD-10-CM

## 2013-10-28 DIAGNOSIS — IMO0002 Reserved for concepts with insufficient information to code with codable children: Secondary | ICD-10-CM

## 2013-10-28 DIAGNOSIS — I10 Essential (primary) hypertension: Secondary | ICD-10-CM | POA: Insufficient documentation

## 2013-10-28 DIAGNOSIS — I509 Heart failure, unspecified: Secondary | ICD-10-CM | POA: Insufficient documentation

## 2013-10-28 DIAGNOSIS — E669 Obesity, unspecified: Secondary | ICD-10-CM

## 2013-10-28 DIAGNOSIS — R0989 Other specified symptoms and signs involving the circulatory and respiratory systems: Secondary | ICD-10-CM | POA: Insufficient documentation

## 2013-10-28 DIAGNOSIS — Z87891 Personal history of nicotine dependence: Secondary | ICD-10-CM | POA: Insufficient documentation

## 2013-10-28 DIAGNOSIS — F17200 Nicotine dependence, unspecified, uncomplicated: Secondary | ICD-10-CM

## 2013-10-28 DIAGNOSIS — F411 Generalized anxiety disorder: Secondary | ICD-10-CM

## 2013-10-28 LAB — CBC
HEMATOCRIT: 39.7 % (ref 36.0–46.0)
Hemoglobin: 13.1 g/dL (ref 12.0–15.0)
MCH: 27.8 pg (ref 26.0–34.0)
MCHC: 33 g/dL (ref 30.0–36.0)
MCV: 84.1 fL (ref 78.0–100.0)
Platelets: 302 10*3/uL (ref 150–400)
RBC: 4.72 MIL/uL (ref 3.87–5.11)
RDW: 14.6 % (ref 11.5–15.5)
WBC: 8.1 10*3/uL (ref 4.0–10.5)

## 2013-10-28 MED ORDER — PROMETHAZINE-DM 6.25-15 MG/5ML PO SYRP: ORAL_SOLUTION | ORAL | Status: DC

## 2013-10-28 MED ORDER — LEVOFLOXACIN 500 MG PO TABS: 500.0000 mg | ORAL_TABLET | Freq: Every day | ORAL | Status: DC

## 2013-10-28 MED ORDER — BENZONATATE 100 MG PO CAPS: 100.0000 mg | ORAL_CAPSULE | Freq: Two times a day (BID) | ORAL | Status: DC | PRN

## 2013-10-28 NOTE — Patient Instructions (Addendum)
F/u in 3 month, call if you need me before  CXR today, Labs today and you will be contacted with results, pls verify contact #  Levaquin, decongestants and cough suppressant prescribed.  Continue phentermine as before  PLEASE do call if symptoms persist or worsen, I expect that you will recover fully soon  You will get a container to send  to lab green sputum if this continues

## 2013-10-28 NOTE — Progress Notes (Signed)
   Subjective:    Patient ID: [name], female    DOB: [birth year], 68 y.o.   MRN: [id number]  HPI Pt in after 10 days approximately of increased head and chest congestion with fever , chills and still has ongoing cough , and feels ill. Was treated in urgent care with z pack and steroids for possible pneumonia, CXR report states lungs were clear. Pt states she has "been pushing herself"still working, though sick , but says she is tired and fatigued with poor appetite. Her chronic conditions are also reviewed, has not been in since 8 months ago, still smokes, giving herself and additional 7 months to quit  Despite current breathing difficulty.    Review of Systems See HPI  Denies sinus pressure, nasal congestion, ear pain or sore throat.  Denies PND, orthopnea, palpitations and leg swelling, has chest pain due to excess cough espescialy when lying down Denies abdominal pain, nausea, vomiting,diarrhea or constipation.   Denies dysuria, frequency, hesitancy or incontinence. Denies joint pain, swelling and limitation in mobility. Denies headaches, seizures, numbness, or tingling. Denies depression,does have  anxiety or insomnia. Denies skin break down or rash.        Objective:   Physical Exam BP 126/78  Pulse 118  Temp(Src) 98.4 F (36.9 C) (Oral)  Resp 16  Wt 198 lb 12.8 oz (90.175 kg)  SpO2 93% Patient alert and oriented and in no cardiopulmonary distress.  HEENT: No facial asymmetry, EOMI, no sinus tenderness,  oropharynx pink and moist.  Neck supple no adenopathy.  Chest: Clear to auscultation bilaterally.Decreased though adequate air entry  CVS: S1, S2 no murmurs, no S3.  ABD: Soft non tender. Bowel sounds normal.  Ext: No edema  MS: Adequate ROM spine, shoulders, hips and knees.  Skin: Intact, no ulcerations or rash noted.  Psych: Good eye contact, normal affect. Memory intact anxious and tearful, not depressed appearing.  CNS: CN 2-12 intact, power,  tone and sensation normal throughout.        Assessment & Plan:  Acute bronchitis Prolonged course of chest congestion with systemic symptoms, levaquin prescribed, and cough suppressant  HYPERTENSION Controlled, no change in medication DASH diet and commitment to daily physical activity for a minimum of 30 minutes discussed and encouraged, as a part of hypertension management. The importance of attaining a healthy weight is also discussed.   Allergic rhinitis Increased symptoms in Spring, medication needs to be taken daily, pt re educated about this  Impaired fasting glucose Deteriorated, has recently been on steroids which is a contributing factor Patient educated about the importance of limiting  Carbohydrate intake , the need to commit to daily physical activity for a minimum of 30 minutes , and to commit weight loss. The fact that changes in all these areas will reduce or eliminate all together the development of diabetes is stressed.     BACK PAIN WITH RADICULOPATHY Unchaned, chronic pain management as before  ANXIETY Increased, continiues to "rely on cigarettes for help with this ,would benefit from SSRI or buspar , will address in the future  OBESITY Improved. Pt applauded on succesful weight loss through lifestyle change, and encouraged to continue same. Weight loss goal set for the next several months.   Nicotine dependence Current is 5 per day, states will quit in 7 monht, should get PFT, and qualifies for chest scan, will discuss at next visit, patient very emotional cureently , and this is also a "sick visit"

## 2013-10-28 NOTE — Telephone Encounter (Signed)
Noted  

## 2013-10-29 LAB — BASIC METABOLIC PANEL
BUN: 20 mg/dL (ref 6–23)
CHLORIDE: 100 meq/L (ref 96–112)
CO2: 30 mEq/L (ref 19–32)
CREATININE: 0.92 mg/dL (ref 0.50–1.10)
Calcium: 9.7 mg/dL (ref 8.4–10.5)
Glucose, Bld: 98 mg/dL (ref 70–99)
Potassium: 4.5 mEq/L (ref 3.5–5.3)
Sodium: 143 mEq/L (ref 135–145)

## 2013-10-29 LAB — HEMOGLOBIN A1C
Hgb A1c MFr Bld: 6.1 % — ABNORMAL HIGH (ref <5.7)
MEAN PLASMA GLUCOSE: 128 mg/dL — AB (ref <117)

## 2013-11-01 ENCOUNTER — Telehealth: Payer: Self-pay | Admitting: Family Medicine

## 2013-11-01 DIAGNOSIS — Z1239 Encounter for other screening for malignant neoplasm of breast: Secondary | ICD-10-CM

## 2013-11-01 LAB — RESPIRATORY CULTURE OR RESPIRATORY AND SPUTUM CULTURE
GRAM STAIN: NONE SEEN
Organism ID, Bacteria: NORMAL

## 2013-11-01 NOTE — Assessment & Plan Note (Signed)
Increased, continiues to "rely on cigarettes for help with this ,would benefit from SSRI or buspar , will address in the future

## 2013-11-01 NOTE — Assessment & Plan Note (Signed)
Deteriorated, has recently been on steroids which is a contributing factor Patient educated about the importance of limiting  Carbohydrate intake , the need to commit to daily physical activity for a minimum of 30 minutes , and to commit weight loss. The fact that changes in all these areas will reduce or eliminate all together the development of diabetes is stressed.

## 2013-11-01 NOTE — Assessment & Plan Note (Signed)
Increased symptoms in Spring, medication needs to be taken daily, pt re educated about this

## 2013-11-01 NOTE — Assessment & Plan Note (Signed)
Prolonged course of chest congestion with systemic symptoms, levaquin prescribed, and cough suppressant

## 2013-11-01 NOTE — Assessment & Plan Note (Signed)
Improved. Pt applauded on succesful weight loss through lifestyle change, and encouraged to continue same. Weight loss goal set for the next several months.  

## 2013-11-01 NOTE — Assessment & Plan Note (Signed)
Controlled, no change in medication DASH diet and commitment to daily physical activity for a minimum of 30 minutes discussed and encouraged, as a part of hypertension management. The importance of attaining a healthy weight is also discussed.  

## 2013-11-01 NOTE — Assessment & Plan Note (Signed)
Current is 5 per day, states will quit in 7 monht, should get PFT, and qualifies for chest scan, will discuss at next visit, patient very emotional cureently , and this is also a "sick visit"

## 2013-11-01 NOTE — Telephone Encounter (Signed)
Please contact pt, let her know mammogram is past due , none in 4 years, pls either enter referral or provide her with the number to have test done,  It is important. Also her colonoscopy is currently past due , last was 10 years ago, pls enter referral to Provider she chooses, I think Dr Arnoldo Morale did her last one , if she agrees. She also needs  lipid and hepatic panel done pls add on to recent labs if possible , if not will need them along with HBa1C , chem 7 for 3 month follow up, thanks

## 2013-11-01 NOTE — Assessment & Plan Note (Signed)
Unchaned, chronic pain management as before

## 2013-11-02 ENCOUNTER — Other Ambulatory Visit: Payer: Self-pay

## 2013-11-02 LAB — HEPATITIS PANEL, ACUTE
HCV Ab: NEGATIVE
HEP B S AG: NEGATIVE
Hep A IgM: NONREACTIVE
Hep B C IgM: NONREACTIVE

## 2013-11-02 LAB — LIPID PANEL
CHOL/HDL RATIO: 2.3 ratio
CHOLESTEROL: 159 mg/dL (ref 0–200)
HDL: 69 mg/dL (ref >39)
LDL Cholesterol: 57 mg/dL (ref 0–99)
Triglycerides: 164 mg/dL — ABNORMAL HIGH (ref <150)
VLDL: 33 mg/dL (ref 0–40)

## 2013-11-02 MED ORDER — FLUCONAZOLE 150 MG PO TABS: 150.0000 mg | ORAL_TABLET | Freq: Once | ORAL | Status: DC

## 2013-11-02 NOTE — Telephone Encounter (Signed)
Patient aware.  States that she would like to hold on colonoscopy referral at this time.  Labs to be added to blood work.

## 2013-11-02 NOTE — Addendum Note (Signed)
Addended by: Denman George B on: 11/02/2013 09:27 AM   Modules accepted: Orders

## 2013-11-05 ENCOUNTER — Other Ambulatory Visit: Payer: Self-pay

## 2013-11-05 MED ORDER — ROSUVASTATIN CALCIUM 40 MG PO TABS: ORAL_TABLET | ORAL | Status: DC

## 2013-11-05 MED ORDER — LISINOPRIL 10 MG PO TABS
ORAL_TABLET | ORAL | Status: DC
Start: 2013-11-05 — End: 2013-12-31

## 2013-11-05 MED ORDER — ALPRAZOLAM 1 MG PO TABS: ORAL_TABLET | ORAL | Status: DC

## 2013-11-05 MED ORDER — TIZANIDINE HCL 4 MG PO TABS: ORAL_TABLET | ORAL | Status: DC

## 2013-11-05 MED ORDER — MELOXICAM 15 MG PO TABS: 15.0000 mg | ORAL_TABLET | Freq: Every day | ORAL | Status: DC

## 2013-11-05 MED ORDER — OMEPRAZOLE 40 MG PO CPDR: DELAYED_RELEASE_CAPSULE | ORAL | Status: DC

## 2013-11-05 MED ORDER — FUROSEMIDE 20 MG PO TABS: 20.0000 mg | ORAL_TABLET | Freq: Two times a day (BID) | ORAL | Status: DC

## 2013-11-09 ENCOUNTER — Ambulatory Visit (HOSPITAL_COMMUNITY)
Admission: RE | Admit: 2013-11-09 | Discharge: 2013-11-09 | Disposition: A | Discharge status: Home / Self Care | Payer: Medicare HMO | Source: Ambulatory Visit | Attending: Family Medicine | Admitting: Family Medicine

## 2013-11-09 DIAGNOSIS — Z1239 Encounter for other screening for malignant neoplasm of breast: Secondary | ICD-10-CM

## 2013-11-09 DIAGNOSIS — Z1231 Encounter for screening mammogram for malignant neoplasm of breast: Secondary | ICD-10-CM | POA: Insufficient documentation

## 2013-11-30 ENCOUNTER — Other Ambulatory Visit: Payer: Self-pay

## 2013-11-30 MED ORDER — HYDROCODONE-ACETAMINOPHEN 5-325 MG PO TABS: ORAL_TABLET | ORAL | Status: DC

## 2013-12-26 ENCOUNTER — Other Ambulatory Visit: Payer: Self-pay | Admitting: Family Medicine

## 2013-12-30 ENCOUNTER — Other Ambulatory Visit: Payer: Self-pay

## 2013-12-30 MED ORDER — HYDROCODONE-ACETAMINOPHEN 5-325 MG PO TABS: ORAL_TABLET | ORAL | Status: DC

## 2013-12-31 ENCOUNTER — Telehealth: Payer: Self-pay | Admitting: Family Medicine

## 2013-12-31 ENCOUNTER — Other Ambulatory Visit: Payer: Self-pay

## 2013-12-31 ENCOUNTER — Ambulatory Visit (INDEPENDENT_AMBULATORY_CARE_PROVIDER_SITE_OTHER): Payer: Medicare HMO | Admitting: Family Medicine

## 2013-12-31 VITALS — BP 120/80 | HR 72 | Resp 14 | Ht 63.0 in | Wt 208.1 lb

## 2013-12-31 DIAGNOSIS — M5440 Lumbago with sciatica, unspecified side: Secondary | ICD-10-CM

## 2013-12-31 DIAGNOSIS — M5430 Sciatica, unspecified side: Secondary | ICD-10-CM

## 2013-12-31 DIAGNOSIS — F32A Depression, unspecified: Secondary | ICD-10-CM

## 2013-12-31 DIAGNOSIS — F419 Anxiety disorder, unspecified: Secondary | ICD-10-CM

## 2013-12-31 DIAGNOSIS — IMO0002 Reserved for concepts with insufficient information to code with codable children: Secondary | ICD-10-CM

## 2013-12-31 DIAGNOSIS — F17200 Nicotine dependence, unspecified, uncomplicated: Secondary | ICD-10-CM

## 2013-12-31 DIAGNOSIS — F19988 Other psychoactive substance use, unspecified with other psychoactive substance-induced disorder: Secondary | ICD-10-CM

## 2013-12-31 DIAGNOSIS — F17208 Nicotine dependence, unspecified, with other nicotine-induced disorders: Secondary | ICD-10-CM

## 2013-12-31 DIAGNOSIS — F3289 Other specified depressive episodes: Secondary | ICD-10-CM

## 2013-12-31 DIAGNOSIS — M549 Dorsalgia, unspecified: Secondary | ICD-10-CM

## 2013-12-31 DIAGNOSIS — F329 Major depressive disorder, single episode, unspecified: Secondary | ICD-10-CM | POA: Insufficient documentation

## 2013-12-31 DIAGNOSIS — I10 Essential (primary) hypertension: Secondary | ICD-10-CM

## 2013-12-31 DIAGNOSIS — E785 Hyperlipidemia, unspecified: Secondary | ICD-10-CM

## 2013-12-31 MED ORDER — POTASSIUM CHLORIDE CRYS ER 20 MEQ PO TBCR: EXTENDED_RELEASE_TABLET | ORAL | Status: DC

## 2013-12-31 MED ORDER — ROSUVASTATIN CALCIUM 40 MG PO TABS: ORAL_TABLET | ORAL | Status: DC

## 2013-12-31 MED ORDER — CELECOXIB 200 MG PO CAPS: 200.0000 mg | ORAL_CAPSULE | Freq: Two times a day (BID) | ORAL | Status: DC

## 2013-12-31 MED ORDER — FLUOXETINE HCL 20 MG PO TABS: 20.0000 mg | ORAL_TABLET | Freq: Every day | ORAL | Status: DC

## 2013-12-31 MED ORDER — METHYLPREDNISOLONE ACETATE 80 MG/ML IJ SUSP
80.0000 mg | Freq: Once | INTRAMUSCULAR | Status: AC
  Administered 2013-12-31: 80 mg via INTRAMUSCULAR

## 2013-12-31 MED ORDER — FUROSEMIDE 20 MG PO TABS: 20.0000 mg | ORAL_TABLET | Freq: Two times a day (BID) | ORAL | Status: DC

## 2013-12-31 MED ORDER — KETOROLAC TROMETHAMINE 60 MG/2ML IM SOLN
60.0000 mg | Freq: Once | INTRAMUSCULAR | Status: AC
Start: 2013-12-31 — End: 2013-12-31
  Administered 2013-12-31: 60 mg via INTRAMUSCULAR

## 2013-12-31 MED ORDER — LISINOPRIL 10 MG PO TABS: ORAL_TABLET | ORAL | Status: DC

## 2013-12-31 MED ORDER — OMEPRAZOLE 40 MG PO CPDR: DELAYED_RELEASE_CAPSULE | ORAL | Status: DC

## 2013-12-31 NOTE — Telephone Encounter (Signed)
Resent med to [facility]

## 2013-12-31 NOTE — Patient Instructions (Signed)
F/u week of June 29, please call if you need me before  Injections for back pain and we will try to get you celebrex since meloxicam is not working,  New med for depression and referral to therapy

## 2014-01-01 ENCOUNTER — Other Ambulatory Visit: Payer: Self-pay

## 2014-01-01 DIAGNOSIS — IMO0002 Reserved for concepts with insufficient information to code with codable children: Secondary | ICD-10-CM

## 2014-01-01 MED ORDER — CELECOXIB 200 MG PO CAPS: 200.0000 mg | ORAL_CAPSULE | Freq: Two times a day (BID) | ORAL | Status: DC

## 2014-01-01 MED ORDER — ROSUVASTATIN CALCIUM 40 MG PO TABS: ORAL_TABLET | ORAL | Status: DC

## 2014-01-26 ENCOUNTER — Ambulatory Visit (HOSPITAL_COMMUNITY): Payer: Self-pay | Admitting: Psychology

## 2014-01-27 ENCOUNTER — Other Ambulatory Visit: Payer: Self-pay

## 2014-01-27 MED ORDER — HYDROCODONE-ACETAMINOPHEN 5-325 MG PO TABS: ORAL_TABLET | ORAL | Status: DC

## 2014-02-01 ENCOUNTER — Telehealth: Payer: Self-pay | Admitting: Family Medicine

## 2014-02-01 NOTE — Telephone Encounter (Signed)
noted 

## 2014-02-09 ENCOUNTER — Ambulatory Visit: Payer: Medicare HMO | Admitting: Family Medicine

## 2014-02-17 ENCOUNTER — Other Ambulatory Visit: Payer: Self-pay | Admitting: Family Medicine

## 2014-02-26 ENCOUNTER — Other Ambulatory Visit: Payer: Self-pay

## 2014-02-26 MED ORDER — HYDROCODONE-ACETAMINOPHEN 5-325 MG PO TABS
ORAL_TABLET | ORAL | Status: DC
Start: 2014-02-26 — End: 2014-03-26

## 2014-03-01 ENCOUNTER — Encounter: Payer: Self-pay | Admitting: Family Medicine

## 2014-03-01 ENCOUNTER — Ambulatory Visit (INDEPENDENT_AMBULATORY_CARE_PROVIDER_SITE_OTHER): Payer: Medicare HMO | Admitting: Family Medicine

## 2014-03-01 VITALS — BP 130/80 | HR 97 | Resp 16 | Wt 206.8 lb

## 2014-03-01 DIAGNOSIS — I10 Essential (primary) hypertension: Secondary | ICD-10-CM

## 2014-03-01 DIAGNOSIS — IMO0002 Reserved for concepts with insufficient information to code with codable children: Secondary | ICD-10-CM

## 2014-03-01 DIAGNOSIS — R7301 Impaired fasting glucose: Secondary | ICD-10-CM

## 2014-03-01 DIAGNOSIS — E785 Hyperlipidemia, unspecified: Secondary | ICD-10-CM

## 2014-03-01 DIAGNOSIS — Z23 Encounter for immunization: Secondary | ICD-10-CM

## 2014-03-01 DIAGNOSIS — F329 Major depressive disorder, single episode, unspecified: Secondary | ICD-10-CM

## 2014-03-01 DIAGNOSIS — F17200 Nicotine dependence, unspecified, uncomplicated: Secondary | ICD-10-CM

## 2014-03-01 DIAGNOSIS — F3289 Other specified depressive episodes: Secondary | ICD-10-CM

## 2014-03-01 DIAGNOSIS — F32A Depression, unspecified: Secondary | ICD-10-CM

## 2014-03-01 NOTE — Patient Instructions (Addendum)
Welcome to medicare in 3 month, call if you need me before  Prevnar today.  Thankful that you are fully recovered, stay on current medication doses  Please seriously work on smoking cessation, this is the best gift you can give yourself!   Fasting lipid, cmp and hBA1C and tSH in 3 month  Pls call us when you decide on colonoscopy and lung scan both are recommended for cancer screening

## 2014-03-01 NOTE — Progress Notes (Signed)
   Subjective:    Patient ID: [name], female    DOB: 1946-07-09, 68 y.o.   MRN: [id number]  HPI The PT is here for follow up and re-evaluation of chronic medical conditions, medication management and review of any available recent lab and radiology data.  Preventive health is updated, specifically  Cancer screening and Immunization.    The PT denies any adverse reactions to current medications since the last visit. Much improved now that she is taking fluoxetine daily, depression resolved, feels well There are no new concerns.  There are no specific complaints       Review of Systems See HPI Denies recent fever or chills. Denies sinus pressure, nasal congestion, ear pain or sore throat. Denies chest congestion, productive cough or wheezing. Denies chest pains, palpitations and leg swelling Denies abdominal pain, nausea, vomiting,diarrhea or constipation.   Denies dysuria, frequency, hesitancy or incontinence. Denies uncontrolled  joint pain, swelling and limitation in mobility. Denies headaches, seizures, numbness, or tingling. Denies depression, anxiety or insomnia. Denies skin break down or rash.        Objective:   Physical Exam BP 130/80  Pulse 97  Resp 16  Wt 206 lb 12.8 oz (93.804 kg)  SpO2 96% Patient alert and oriented and in no cardiopulmonary distress.  HEENT: No facial asymmetry, EOMI,   oropharynx pink and moist.  Neck supple no JVD, no mass.  Chest: Clear to auscultation bilaterally.  CVS: S1, S2 no murmurs, no S3.Regular rate.  ABD: Soft non tender.   Ext: No edema  MS: Adequate ROM spine, shoulders, hips and knees.  Skin: Intact, no ulcerations or rash noted.  Psych: Good eye contact, normal affect. Memory intact not anxious or depressed appearing.  CNS: CN 2-12 intact, power,  normal throughout.no focal deficits noted.        Assessment & Plan:  HYPERTENSION Controlled, no change in medication DASH diet and commitment to  daily physical activity for a minimum of 30 minutes discussed and encouraged, as a part of hypertension management. The importance of attaining a healthy weight is also discussed.   Impaired fasting glucose Patient educated about the importance of limiting  Carbohydrate intake , the need to commit to daily physical activity for a minimum of 30 minutes , and to commit weight loss. The fact that changes in all these areas will reduce or eliminate all together the development of diabetes is stressed.   Updated lab needed at/ before next visit.   Nicotine dependence Unchanged Patient counseled for approximately 3 minutes regarding the health risks of ongoing nicotine use, specifically all types of cancer, heart disease, stroke and respiratory failure. The options available for help with cessation ,the behavioral changes to assist the process, and the option to either gradully reduce usage  Or abruptly stop.is also discussed. Pt is also encouraged to set specific goals in number of cigarettes used daily, as well as to set a quit date.   HYPERLIPIDEMIA Elevated tG Updated lab needed at/ before next visit. Hyperlipidemia:Low fat diet discussed and encouraged.    Depression Marked improvement continue cuirrent medication  Need for vaccination with 13-polyvalent pneumococcal conjugate vaccine Vaccine administered at visit.   BACK PAIN WITH RADICULOPATHY Chronic and unchanged, continue current medication

## 2014-03-15 ENCOUNTER — Other Ambulatory Visit: Payer: Self-pay

## 2014-03-15 MED ORDER — ALPRAZOLAM 1 MG PO TABS: ORAL_TABLET | ORAL | Status: DC

## 2014-03-26 ENCOUNTER — Other Ambulatory Visit: Payer: Self-pay

## 2014-03-26 MED ORDER — HYDROCODONE-ACETAMINOPHEN 5-325 MG PO TABS
ORAL_TABLET | ORAL | Status: DC
Start: 2014-03-26 — End: 2014-05-27

## 2014-04-27 DIAGNOSIS — Z23 Encounter for immunization: Secondary | ICD-10-CM | POA: Insufficient documentation

## 2014-04-27 NOTE — Assessment & Plan Note (Signed)
Unchanged Patient counseled for approximately 3 minutes regarding the health risks of ongoing nicotine use, specifically all types of cancer, heart disease, stroke and respiratory failure. The options available for help with cessation ,the behavioral changes to assist the process, and the option to either gradully reduce usage  Or abruptly stop.is also discussed. Pt is also encouraged to set specific goals in number of cigarettes used daily, as well as to set a quit date.

## 2014-04-27 NOTE — Assessment & Plan Note (Signed)
Patient educated about the importance of limiting  Carbohydrate intake , the need to commit to daily physical activity for a minimum of 30 minutes , and to commit weight loss. The fact that changes in all these areas will reduce or eliminate all together the development of diabetes is stressed.   Updated lab needed at/ before next visit.  

## 2014-04-27 NOTE — Assessment & Plan Note (Signed)
Elevated tG Updated lab needed at/ before next visit. Hyperlipidemia:Low fat diet discussed and encouraged.

## 2014-04-27 NOTE — Assessment & Plan Note (Signed)
Controlled, no change in medication DASH diet and commitment to daily physical activity for a minimum of 30 minutes discussed and encouraged, as a part of hypertension management. The importance of attaining a healthy weight is also discussed.  

## 2014-04-27 NOTE — Assessment & Plan Note (Signed)
Chronic and unchanged, continue current medication

## 2014-04-27 NOTE — Assessment & Plan Note (Signed)
Marked improvement continue cuirrent medication

## 2014-04-27 NOTE — Assessment & Plan Note (Signed)
Vaccine administered at visit.  

## 2014-04-29 ENCOUNTER — Telehealth: Payer: Self-pay | Admitting: *Deleted

## 2014-04-29 DIAGNOSIS — IMO0002 Reserved for concepts with insufficient information to code with codable children: Secondary | ICD-10-CM

## 2014-04-29 MED ORDER — CELECOXIB 200 MG PO CAPS: 200.0000 mg | ORAL_CAPSULE | Freq: Two times a day (BID) | ORAL | Status: DC

## 2014-04-29 NOTE — Telephone Encounter (Signed)
Med refilled to the [facility]

## 2014-04-29 NOTE — Telephone Encounter (Signed)
Pt called requesting her medication to be refilled. Please advise!

## 2014-05-14 ENCOUNTER — Other Ambulatory Visit: Payer: Self-pay | Admitting: Family Medicine

## 2014-05-14 ENCOUNTER — Telehealth: Payer: Self-pay | Admitting: Family Medicine

## 2014-05-14 MED ORDER — CELECOXIB 400 MG PO CAPS: ORAL_CAPSULE | ORAL | Status: DC

## 2014-05-14 NOTE — Telephone Encounter (Signed)
Discussed with pt discontinuing celebrex due to new warnings re safety, and increased risk of heart disease. She has decided to cut back with a view to stopping the drug. We discussed this, her new script is for 400 mg one daily as needed, for uncontrolled pain, maximum of 10 capsules per 30 days. This is sent in as a new script to the pharmacy

## 2014-05-27 ENCOUNTER — Other Ambulatory Visit: Payer: Self-pay

## 2014-05-27 MED ORDER — HYDROCODONE-ACETAMINOPHEN 5-325 MG PO TABS: ORAL_TABLET | ORAL | Status: DC

## 2014-05-28 ENCOUNTER — Other Ambulatory Visit: Payer: Self-pay

## 2014-05-29 LAB — HEMOGLOBIN A1C
Hgb A1c MFr Bld: 5.9 % — ABNORMAL HIGH
Mean Plasma Glucose: 123 mg/dL — ABNORMAL HIGH

## 2014-05-29 LAB — TSH: TSH: 1.144 u[IU]/mL (ref 0.350–4.500)

## 2014-05-30 LAB — COMPREHENSIVE METABOLIC PANEL
ALT: 15 U/L (ref 0–35)
AST: 33 U/L (ref 0–37)
Albumin: 4.1 g/dL (ref 3.5–5.2)
Alkaline Phosphatase: 62 U/L (ref 39–117)
BUN: 18 mg/dL (ref 6–23)
CALCIUM: 9.4 mg/dL (ref 8.4–10.5)
CHLORIDE: 102 meq/L (ref 96–112)
CO2: 29 mEq/L (ref 19–32)
CREATININE: 0.88 mg/dL (ref 0.50–1.10)
Glucose, Bld: 87 mg/dL (ref 70–99)
POTASSIUM: 4.5 meq/L (ref 3.5–5.3)
Sodium: 140 mEq/L (ref 135–145)
Total Bilirubin: 0.4 mg/dL (ref 0.2–1.2)
Total Protein: 6.5 g/dL (ref 6.0–8.3)

## 2014-05-30 LAB — LIPID PANEL
CHOL/HDL RATIO: 2.2 ratio
CHOLESTEROL: 106 mg/dL (ref 0–200)
HDL: 48 mg/dL (ref >39)
LDL Cholesterol: 32 mg/dL (ref 0–99)
TRIGLYCERIDES: 128 mg/dL (ref <150)
VLDL: 26 mg/dL (ref 0–40)

## 2014-05-31 ENCOUNTER — Encounter: Payer: Self-pay | Admitting: Family Medicine

## 2014-05-31 ENCOUNTER — Ambulatory Visit (INDEPENDENT_AMBULATORY_CARE_PROVIDER_SITE_OTHER): Payer: Medicare HMO | Admitting: Family Medicine

## 2014-05-31 VITALS — BP 140/84 | HR 100 | Resp 18 | Ht 63.0 in | Wt 218.0 lb

## 2014-05-31 DIAGNOSIS — Z1211 Encounter for screening for malignant neoplasm of colon: Secondary | ICD-10-CM

## 2014-05-31 DIAGNOSIS — Z0000 Encounter for general adult medical examination without abnormal findings: Secondary | ICD-10-CM | POA: Insufficient documentation

## 2014-05-31 DIAGNOSIS — F17209 Nicotine dependence, unspecified, with unspecified nicotine-induced disorders: Secondary | ICD-10-CM

## 2014-05-31 DIAGNOSIS — Z23 Encounter for immunization: Secondary | ICD-10-CM | POA: Insufficient documentation

## 2014-05-31 DIAGNOSIS — F17208 Nicotine dependence, unspecified, with other nicotine-induced disorders: Secondary | ICD-10-CM

## 2014-05-31 DIAGNOSIS — R8290 Unspecified abnormal findings in urine: Secondary | ICD-10-CM

## 2014-05-31 LAB — POCT URINALYSIS DIPSTICK
BILIRUBIN UA: NEGATIVE
Blood, UA: NEGATIVE
Glucose, UA: NEGATIVE
Nitrite, UA: NEGATIVE
PROTEIN UA: NEGATIVE
Spec Grav, UA: 1.02
Urobilinogen, UA: 0.2
pH, UA: 5.5

## 2014-05-31 NOTE — Assessment & Plan Note (Signed)
Abnormal urinalysis, pt asymptomatic, will wait on c/s result before prescribing antibiotic

## 2014-05-31 NOTE — Assessment & Plan Note (Signed)
Vaccine administered at visit.  

## 2014-05-31 NOTE — Assessment & Plan Note (Addendum)
Annual exam as documented. Counseling done  re healthy lifestyle involving commitment to 150 minutes exercise per week, heart healthy diet, and attaining healthy weight.The importance of adequate sleep also discussed. Regular seat belt use and home safety, is also discussed. Changes in health habits are decided on by the patient with goals and time frames  set for achieving them. Immunization and cancer screening needs are specifically addressed at this visit. EKG done shows normal sinus rythm, no ischemia and no LVH

## 2014-05-31 NOTE — Progress Notes (Signed)
Subjective:    Patient ID: [name], female    DOB: Dec 09, 1945, 68 y.o.   MRN: [id number]  HPI  Preventive Screening-Counseling & Management   Patient present here today for a Medicare annual wellness visit.   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 No  Over the past two weeks, have you felt little interest or pleasure in doing things? No  Have you lost interest or pleasure in daily life? No  Do you often feel hopeless? No  Do you cry easily over simple problems? No   Activities of Daily Living  In your present state of health, do you have any difficulty performing the following activities?  Driving?: No Managing money?: No Feeding yourself?:No Getting from bed to chair?:No Climbing a flight of stairs?: Back problems causes a problem with increased # of stairs Preparing food and eating?:No Bathing or showering?:No Getting dressed?:No Getting to the toilet?:No Using the toilet?:No Moving around from place to place?: No  Fall Risk Assessment In the past year have you fallen or had a near fall?:yes, accidentally fell  Are you currently taking any medications that make you dizzy?:No   Hearing Difficulties: No Do you often ask people to speak up or repeat themselves?:No Do you experience ringing or noises in your ears?:No Do you have difficulty understanding soft or whispered voices?:No  Cognitive Testing  Alert? Yes Normal Appearance?Yes  Oriented to  person? Yes Place? Yes  Time? Yes  Displays appropriate judgment?Yes  Can read the correct time from a watch face? yes Are you having problems remembering things?No  Advanced Directives have been discussed with the patient?Yes , full code   List the Names of Other Physician/Practitioners you currently use: Dr. Jorja Loa- opthalmology    Indicate any recent Medical Services you may have received from other than [facility] providers in the past year (date may be approximate).   Assessment:    Annual Wellness Exam   Plan:      Medicare Attestation  I have personally reviewed:  The patient's medical and social history  Their use of alcohol, tobacco or illicit drugs  Their current medications and supplements  The patient's functional ability including ADLs,fall risks, home safety risks, cognitive, and hearing and visual impairment  Diet and physical activities  Evidence for depression or mood disorders  The patient's weight, height, BMI, and visual acuity have been recorded in the chart. I have made referrals, counseling, and provided education to the patient based on review of the above and I have provided the patient with a written personalized care plan for preventive services.     Review of Systems     Objective:   Physical Exam        Assessment & Plan:  Need for prophylactic vaccination and inoculation against influenza Vaccine administered at visit.   Welcome to Medicare preventive visit Annual exam as documented. Counseling done  re healthy lifestyle involving commitment to 150 minutes exercise per  week, heart healthy diet, and attaining healthy weight.The importance of adequate sleep also discussed. Regular seat belt use and home safety, is also discussed. Changes in health habits are decided on by the patient with goals and time frames  set for achieving them. Immunization and cancer screening needs are specifically addressed at this visit. EKG done shows normal  sinus rythm, no ischemia and no LVH   Abnormal urinalysis Abnormal urinalysis, pt asymptomatic, will wait on c/s result before prescribing antibiotic

## 2014-05-31 NOTE — Patient Instructions (Addendum)
F/u in 4 month, call if you need me before  You are referred for upper endoscopy and colonoscopy to Dr Lazarus Gowda are referred for lung cancer screening  Please STICK to [place] date of 10/20  Or 06/02/2014, you can dio this and you need to  Excellent labs  Urine may be infected we will be in touch  Flu vaccine today  Will contact you about aneurysm screen  EKG shows no heart damage , and rhythm is normal, heart is not  enlarged   pls reduce portion size and commit to more  Regular exercise  CAREFUL NO MORE FALLS!  Fall Prevention and Home Safety Falls cause injuries and can affect all age groups. It is possible to prevent falls.  HOW TO PREVENT FALLS  Wear shoes with rubber soles that do not have an opening for your toes.  Keep the inside and outside of your house well lit.  Use night lights throughout your home.  Remove clutter from floors.  Clean up floor spills.  Remove throw rugs or fasten them to the floor with carpet tape.  Do not place electrical cords across pathways.  Put grab bars by your tub, shower, and toilet. Do not use towel bars as grab bars.  Put handrails on both sides of the stairway. Fix loose handrails.  Do not climb on stools or stepladders, if possible.  Do not wax your floors.  Repair uneven or unsafe sidewalks, walkways, or stairs.  Keep items you use a lot within reach.  Be aware of pets.  Keep emergency numbers next to the telephone.  Put smoke detectors in your home and near bedrooms. Ask your doctor what other things you can do to prevent falls. Document Released: 05/26/2009 Document Revised: 01/29/2012 Document Reviewed: 10/30/2011 [facility] Patient Information 2015 [place], [place]. This information is not intended to replace advice given to you by your health care provider. Make sure you discuss any questions you have with your health care provider.

## 2014-06-02 LAB — URINE CULTURE
Colony Count: NO GROWTH
Organism ID, Bacteria: NO GROWTH

## 2014-06-02 NOTE — Addendum Note (Signed)
Addended by: Denman George B on: 06/02/2014 09:19 AM   Modules accepted: Orders

## 2014-06-04 ENCOUNTER — Ambulatory Visit (HOSPITAL_COMMUNITY)
Admission: RE | Admit: 2014-06-04 | Discharge: 2014-06-04 | Disposition: A | Discharge status: Home / Self Care | Payer: Medicare HMO | Source: Ambulatory Visit | Attending: Family Medicine | Admitting: Family Medicine

## 2014-06-04 DIAGNOSIS — F17209 Nicotine dependence, unspecified, with unspecified nicotine-induced disorders: Secondary | ICD-10-CM | POA: Diagnosis not present

## 2014-06-04 DIAGNOSIS — R911 Solitary pulmonary nodule: Secondary | ICD-10-CM | POA: Insufficient documentation

## 2014-06-04 DIAGNOSIS — F17208 Nicotine dependence, unspecified, with other nicotine-induced disorders: Secondary | ICD-10-CM

## 2014-06-04 DIAGNOSIS — Z09 Encounter for follow-up examination after completed treatment for conditions other than malignant neoplasm: Secondary | ICD-10-CM | POA: Diagnosis not present

## 2014-06-09 ENCOUNTER — Encounter: Payer: Self-pay | Admitting: Family Medicine

## 2014-06-22 ENCOUNTER — Telehealth: Payer: Self-pay | Admitting: *Deleted

## 2014-06-22 DIAGNOSIS — R05 Cough: Secondary | ICD-10-CM

## 2014-06-22 DIAGNOSIS — R059 Cough, unspecified: Secondary | ICD-10-CM

## 2014-06-22 MED ORDER — PROMETHAZINE-DM 6.25-15 MG/5ML PO SYRP: 5.0000 mL | ORAL_SOLUTION | Freq: Every evening | ORAL | Status: DC | PRN

## 2014-06-22 NOTE — Telephone Encounter (Signed)
Started coughing yesterday with a little yellow phlegm coming up and she used the last of her promethazine. Ok to refill per Dr

## 2014-06-22 NOTE — Telephone Encounter (Signed)
Pt called stating she has been coughing up yellow stuff, pt is requesting a refill for promethacinepm sent to [facility] in [place]. Please advise

## 2014-06-24 ENCOUNTER — Other Ambulatory Visit: Payer: Self-pay

## 2014-06-24 MED ORDER — HYDROCODONE-ACETAMINOPHEN 5-325 MG PO TABS: ORAL_TABLET | ORAL | Status: DC

## 2014-06-27 ENCOUNTER — Telehealth: Payer: Self-pay | Admitting: Family Medicine

## 2014-06-27 MED ORDER — DOXYCYCLINE HYCLATE 100 MG PO TABS: 100.0000 mg | ORAL_TABLET | Freq: Two times a day (BID) | ORAL | Status: DC

## 2014-06-27 MED ORDER — PREDNISONE (PAK) 5 MG PO TABS: 5.0000 mg | ORAL_TABLET | ORAL | Status: DC

## 2014-06-27 MED ORDER — ALBUTEROL SULFATE HFA 108 (90 BASE) MCG/ACT IN AERS: 2.0000 | INHALATION_SPRAY | Freq: Four times a day (QID) | RESPIRATORY_TRACT | Status: DC | PRN

## 2014-06-27 MED ORDER — BENZONATATE 100 MG PO CAPS: 100.0000 mg | ORAL_CAPSULE | Freq: Two times a day (BID) | ORAL | Status: DC | PRN

## 2014-06-27 NOTE — Telephone Encounter (Signed)
Cough x 1 week, chest tightness with green sputum x 4 days Called in on 11/15 stating "I can hardly breathe, no documented fever I advised I will send in prednisone , and antibiotic , decongestant and inhaler, needs to be seen i n office in am, go to ED today if worsening, sh eunderstands toc all in am for appt to be worked in on Monday

## 2014-06-28 ENCOUNTER — Ambulatory Visit (HOSPITAL_COMMUNITY)
Admission: RE | Admit: 2014-06-28 | Discharge: 2014-06-28 | Disposition: A | Discharge status: Home / Self Care | Payer: Medicare HMO | Source: Ambulatory Visit | Attending: Family Medicine | Admitting: Family Medicine

## 2014-06-28 ENCOUNTER — Ambulatory Visit (INDEPENDENT_AMBULATORY_CARE_PROVIDER_SITE_OTHER): Payer: Medicare HMO | Admitting: Family Medicine

## 2014-06-28 ENCOUNTER — Encounter: Payer: Self-pay | Admitting: Family Medicine

## 2014-06-28 VITALS — BP 142/64 | HR 120 | Temp 99.0°F | Resp 20 | Ht 63.0 in

## 2014-06-28 DIAGNOSIS — I10 Essential (primary) hypertension: Secondary | ICD-10-CM

## 2014-06-28 DIAGNOSIS — J209 Acute bronchitis, unspecified: Secondary | ICD-10-CM | POA: Insufficient documentation

## 2014-06-28 DIAGNOSIS — J449 Chronic obstructive pulmonary disease, unspecified: Secondary | ICD-10-CM | POA: Diagnosis not present

## 2014-06-28 DIAGNOSIS — J440 Chronic obstructive pulmonary disease with acute lower respiratory infection: Secondary | ICD-10-CM | POA: Insufficient documentation

## 2014-06-28 DIAGNOSIS — F17208 Nicotine dependence, unspecified, with other nicotine-induced disorders: Secondary | ICD-10-CM

## 2014-06-28 MED ORDER — METHYLPREDNISOLONE ACETATE 80 MG/ML IJ SUSP
80.0000 mg | Freq: Once | INTRAMUSCULAR | Status: AC
  Administered 2014-06-28: 80 mg via INTRAMUSCULAR

## 2014-06-28 MED ORDER — ALBUTEROL SULFATE (2.5 MG/3ML) 0.083% IN NEBU
2.5000 mg | INHALATION_SOLUTION | Freq: Once | RESPIRATORY_TRACT | Status: AC
  Administered 2014-06-28: 2.5 mg via RESPIRATORY_TRACT

## 2014-06-28 MED ORDER — IPRATROPIUM BROMIDE 0.02 % IN SOLN
0.5000 mg | Freq: Once | RESPIRATORY_TRACT | Status: AC
  Administered 2014-06-28: 0.5 mg via RESPIRATORY_TRACT

## 2014-06-28 MED ORDER — IPRATROPIUM-ALBUTEROL 0.5-2.5 (3) MG/3ML IN SOLN: 3.0000 mL | Freq: Four times a day (QID) | RESPIRATORY_TRACT | Status: DC | PRN

## 2014-06-28 NOTE — Assessment & Plan Note (Signed)
Blood pressure elevated today, however , pt in C/P distress, no change in meds

## 2014-06-28 NOTE — Assessment & Plan Note (Addendum)
Severe cough with shortness of breath, continue antibiotic prescribed, neb treatment in office then for use at home, depo medrol administered in the office  CXR today

## 2014-06-28 NOTE — Assessment & Plan Note (Signed)
Pt counseled re need to stop smoking to improve her breathing

## 2014-06-28 NOTE — Progress Notes (Signed)
   Subjective:    Patient ID: [name], female    DOB: November 07, 1945, 68 y.o.   MRN: [id number]  HPI 5 day h/o progressive cough and shortness of breath with wheezing, started antibiotic and steroid yesterday following call here for f/u   Review of Systems See HPI Recent chills Denies sinus pressure, nasal congestion, ear pain or sore throat. Increased cough, chest congestion and wheeze Chest pain with cough Denies abdominal pain, nausea, vomiting,diarrhea or constipation.   Denies dysuria, frequency, hesitancy or incontinence. Denies joint pain, swelling and limitation in mobility. Denies headaches, seizures, numbness, or tingling. Denies depression, anxiety or insomnia. Denies skin break down or rash.        Objective:   Physical Exam BP 142/64 mmHg  Pulse 120  Temp(Src) 99 F (37.2 C)  Resp 20  Ht 5\' 3"  (1.6 m)  SpO2 94%   Patient alert and oriented and in mild  cardiopulmonary distressTachypneic and tachcardic  HEENT: No facial asymmetry, EOMI,   oropharynx pink and moist.  Neck supple no JVD, no mass.  Chest: Decreased air entry throughout scattered crackles and wheezes  CVS: S1, S2 no murmurs, no S3.Regular rate.  ABD: Soft non tender.   Ext: No edema  MS: Adequate ROM spine, shoulders, hips and knees.  Skin: Intact, no ulcerations or rash noted.  Psych: Good eye contact, normal affect. Memory intact not anxious or depressed appearing.  CNS: CN 2-12 intact, power,  normal throughout.no focal deficits noted.       Assessment & Plan:  Bronchitis, chronic obstructive w acute bronchitis Severe cough with shortness of breath, continue antibiotic prescribed, neb treatment in office then for use at home, depo medrol administered in the office  CXR today  Essential hypertension Blood pressure elevated today, however , pt in C/P distress, no change in meds  Nicotine dependence Pt counseled re need to stop smoking to improve her breathing

## 2014-06-28 NOTE — Patient Instructions (Addendum)
F/u as before  You are treated for COPD flare and acute bronchitis.  Continue medication started yesterday Depo medrol in office today and neb treatment   CXR today  New is duoneb at home every 8 hours as neede for wheezing  DO NOT SMOKE ANYMORE , please try yo make this your reality

## 2014-07-03 ENCOUNTER — Other Ambulatory Visit: Payer: Self-pay | Admitting: Family Medicine

## 2014-07-14 ENCOUNTER — Other Ambulatory Visit: Payer: Self-pay | Admitting: Family Medicine

## 2014-07-30 ENCOUNTER — Other Ambulatory Visit: Payer: Self-pay

## 2014-07-30 MED ORDER — HYDROCODONE-ACETAMINOPHEN 5-325 MG PO TABS: ORAL_TABLET | ORAL | Status: DC

## 2014-08-12 ENCOUNTER — Other Ambulatory Visit: Payer: Self-pay | Admitting: Family Medicine

## 2014-08-16 ENCOUNTER — Other Ambulatory Visit: Payer: Self-pay | Admitting: Family Medicine

## 2014-08-16 ENCOUNTER — Telehealth: Payer: Self-pay | Admitting: Family Medicine

## 2014-08-16 ENCOUNTER — Other Ambulatory Visit: Payer: Self-pay

## 2014-08-16 MED ORDER — OMEPRAZOLE 40 MG PO CPDR: 40.0000 mg | DELAYED_RELEASE_CAPSULE | Freq: Every day | ORAL | Status: DC

## 2014-08-16 MED ORDER — DILTIAZEM HCL ER COATED BEADS 120 MG PO CP24: 120.0000 mg | ORAL_CAPSULE | Freq: Every day | ORAL | Status: DC

## 2014-08-16 NOTE — Telephone Encounter (Signed)
Meds refilled.

## 2014-08-23 ENCOUNTER — Encounter: Payer: Self-pay | Admitting: Family Medicine

## 2014-08-23 NOTE — Progress Notes (Signed)
   Subjective:    Patient ID: [name], female    DOB: 1946/07/05, 69 y.o.   MRN: [id number]  HPI The PT is here for follow up and re-evaluation of chronic medical conditions, medication management and review of any available recent lab and radiology data.  Preventive health is updated, specifically  Cancer screening and Immunization.   Questions or concerns regarding consultations or procedures which the PT has had in the interim are  addressed. The PT denies any adverse reactions to current medications since the last visit.  Uncontrolled and debilitatating low back pain to right leg for past 2 weks, no aggravating factor, denies incontinence , but notes right lower ext numbness Increased and uncontrolled depression wants help      Review of Systems  See HPI Denies recent fever or chills. Denies sinus pressure, nasal congestion, ear pain or sore throat. Denies chest congestion, productive cough or wheezing. Denies chest pains, palpitations and leg swelling Denies abdominal pain, nausea, vomiting,diarrhea or constipation.   Denies dysuria, frequency, hesitancy or incontinence.  Denies skin break down or rash.         Objective:   Physical Exam BP 120/80 mmHg  Pulse 72  Resp 14  Ht 5\' 3"  (1.6 m)  Wt 208 lb 1.9 oz (94.403 kg)  BMI 36.88 kg/m2  SpO2 92% Patient alert and oriented and in no cardiopulmonary distress.Pt tearful and in pain  HEENT: No facial asymmetry, EOMI,   oropharynx pink and moist.  Neck supple no JVD, no mass.  Chest: Clear to auscultation bilaterally.Decreased air entry throughout  CVS: S1, S2 no murmurs, no S3.Regular rate.  ABD: Soft non tender.   Ext: No edema  MS: decreased ROM spine,   Skin: Intact, no ulcerations or rash noted.  Psych: Poor  eye contact, tearful, depressed , crying and anxious   CNS: CN 2-12 intact,power normal in all 4 extremities  and sensation decreased in right lower ext       Assessment & Plan:    Depression Not suicidal or homicidal but overwhelmed because of illnesses and deaths in close family members, multiple in the past year Start medication and refer to therapy  Back pain with sciatica Increased and uncontrolled symptoms, pt has had back surgery in the past. Injections im in office and attempt to modify pain manage,mment   Nicotine dependence Increased nicotine use unwilling to set quit date at thsi time due to emotional distrss though she realized the need to quit Patient counseled for approximately 5 minutes regarding the health risks of ongoing nicotine use, specifically all types of cancer, heart disease, stroke and respiratory failure. The options available for help with cessation ,the behavioral changes to assist the process, and the option to either gradully reduce usage  Or abruptly stop.is also discussed. Pt is also encouraged to set specific goals in number of cigarettes used daily, as well as to set a quit date.   Hyperlipidemia LDL goal <100 Uncontrolled due to elevated triglycerides Hyperlipidemia:Low fat diet discussed and encouraged.  Updated lab needed at/ before next visit.

## 2014-08-23 NOTE — Assessment & Plan Note (Signed)
Not suicidal or homicidal but overwhelmed because of illnesses and deaths in close family members, multiple in the past year Start medication and refer to therapy

## 2014-08-23 NOTE — Assessment & Plan Note (Signed)
Controlled, no change in medication DASH diet and commitment to daily physical activity for a minimum of 30 minutes discussed and encouraged, as a part of hypertension management. The importance of attaining a healthy weight is also discussed.  

## 2014-08-23 NOTE — Assessment & Plan Note (Signed)
Increased nicotine use unwilling to set quit date at thsi time due to emotional distrss though she realized the need to quit Patient counseled for approximately 5 minutes regarding the health risks of ongoing nicotine use, specifically all types of cancer, heart disease, stroke and respiratory failure. The options available for help with cessation ,the behavioral changes to assist the process, and the option to either gradully reduce usage  Or abruptly stop.is also discussed. Pt is also encouraged to set specific goals in number of cigarettes used daily, as well as to set a quit date.

## 2014-08-23 NOTE — Assessment & Plan Note (Signed)
Increased and uncontrolled symptoms, pt has had back surgery in the past. Injections im in office and attempt to modify pain manage,mment

## 2014-08-23 NOTE — Assessment & Plan Note (Signed)
Uncontrolled due to elevated triglycerides Hyperlipidemia:Low fat diet discussed and encouraged.  Updated lab needed at/ before next visit.

## 2014-08-27 ENCOUNTER — Other Ambulatory Visit: Payer: Self-pay

## 2014-08-27 MED ORDER — HYDROCODONE-ACETAMINOPHEN 5-325 MG PO TABS: ORAL_TABLET | ORAL | Status: DC

## 2014-09-02 ENCOUNTER — Other Ambulatory Visit: Payer: Self-pay

## 2014-09-02 ENCOUNTER — Other Ambulatory Visit: Payer: Self-pay | Admitting: Family Medicine

## 2014-09-02 ENCOUNTER — Telehealth: Payer: Self-pay | Admitting: *Deleted

## 2014-09-02 MED ORDER — CELECOXIB 400 MG PO CAPS: ORAL_CAPSULE | ORAL | Status: DC

## 2014-09-02 MED ORDER — FUROSEMIDE 20 MG PO TABS: 20.0000 mg | ORAL_TABLET | Freq: Two times a day (BID) | ORAL | Status: DC

## 2014-09-02 NOTE — Telephone Encounter (Signed)
meds refilled 

## 2014-09-02 NOTE — Telephone Encounter (Signed)
Pt called requesting her Laxis to be refilled as a 30 day supply and not 90 days. Pt is also requesting celebrex to be refilled. Please advise

## 2014-09-24 ENCOUNTER — Other Ambulatory Visit: Payer: Self-pay

## 2014-09-24 MED ORDER — HYDROCODONE-ACETAMINOPHEN 5-325 MG PO TABS: ORAL_TABLET | ORAL | Status: DC

## 2014-10-14 ENCOUNTER — Other Ambulatory Visit: Payer: Self-pay | Admitting: Family Medicine

## 2014-10-22 ENCOUNTER — Other Ambulatory Visit: Payer: Self-pay

## 2014-10-22 MED ORDER — HYDROCODONE-ACETAMINOPHEN 5-325 MG PO TABS: ORAL_TABLET | ORAL | Status: DC

## 2014-11-02 ENCOUNTER — Other Ambulatory Visit: Payer: Self-pay | Admitting: Family Medicine

## 2014-11-03 ENCOUNTER — Other Ambulatory Visit: Payer: Self-pay | Admitting: Family Medicine

## 2014-11-04 ENCOUNTER — Other Ambulatory Visit: Payer: Self-pay

## 2014-11-04 MED ORDER — CELECOXIB 400 MG PO CAPS: ORAL_CAPSULE | ORAL | Status: DC

## 2014-11-04 MED ORDER — TIZANIDINE HCL 4 MG PO TABS: ORAL_TABLET | ORAL | Status: DC

## 2014-11-04 MED ORDER — ALPRAZOLAM 1 MG PO TABS: 1.0000 mg | ORAL_TABLET | Freq: Two times a day (BID) | ORAL | Status: DC

## 2014-11-17 ENCOUNTER — Other Ambulatory Visit: Payer: Self-pay | Admitting: Family Medicine

## 2014-11-19 ENCOUNTER — Other Ambulatory Visit: Payer: Self-pay

## 2014-11-19 MED ORDER — HYDROCODONE-ACETAMINOPHEN 5-325 MG PO TABS: ORAL_TABLET | ORAL | Status: DC

## 2014-11-30 ENCOUNTER — Other Ambulatory Visit: Payer: Self-pay | Admitting: Family Medicine

## 2014-12-02 ENCOUNTER — Encounter: Payer: Self-pay | Admitting: Family Medicine

## 2014-12-02 ENCOUNTER — Ambulatory Visit (INDEPENDENT_AMBULATORY_CARE_PROVIDER_SITE_OTHER): Payer: PPO | Admitting: Family Medicine

## 2014-12-02 VITALS — BP 116/68 | HR 110 | Resp 18 | Ht 63.0 in | Wt 207.0 lb

## 2014-12-02 DIAGNOSIS — K219 Gastro-esophageal reflux disease without esophagitis: Secondary | ICD-10-CM | POA: Diagnosis not present

## 2014-12-02 DIAGNOSIS — E785 Hyperlipidemia, unspecified: Secondary | ICD-10-CM

## 2014-12-02 DIAGNOSIS — F411 Generalized anxiety disorder: Secondary | ICD-10-CM

## 2014-12-02 DIAGNOSIS — R7301 Impaired fasting glucose: Secondary | ICD-10-CM | POA: Diagnosis not present

## 2014-12-02 DIAGNOSIS — F32A Depression, unspecified: Secondary | ICD-10-CM

## 2014-12-02 DIAGNOSIS — F17218 Nicotine dependence, cigarettes, with other nicotine-induced disorders: Secondary | ICD-10-CM

## 2014-12-02 DIAGNOSIS — I10 Essential (primary) hypertension: Secondary | ICD-10-CM | POA: Diagnosis not present

## 2014-12-02 DIAGNOSIS — M5440 Lumbago with sciatica, unspecified side: Secondary | ICD-10-CM

## 2014-12-02 DIAGNOSIS — F329 Major depressive disorder, single episode, unspecified: Secondary | ICD-10-CM

## 2014-12-02 NOTE — Progress Notes (Signed)
Subjective:    Patient ID: [name], female    DOB: 1945/11/05, 69 y.o.   MRN: [id number]  HPI   [name]     MRN: [id number]      DOB: 11-19-1945   HPI Ms. Zettlemoyer is here for follow up and re-evaluation of chronic medical conditions, medication management and review of any available recent lab and radiology data.  Preventive health is updated, specifically  Cancer screening and Immunization.   Questions or concerns regarding consultations or procedures which the PT has had in the interim are  addressed. The PT denies any adverse reactions to current medications since the last visit.  There are no new concerns.  There are no specific complaints   ROS Denies recent fever or chills. Denies sinus pressure, nasal congestion, ear pain or sore throat. Denies chest congestion, productive cough or wheezing. Denies chest pains, palpitations and leg swelling Denies abdominal pain, nausea, vomiting,diarrhea or constipation.   Denies dysuria, frequency, hesitancy or incontinence. Denies joint pain, swelling and limitation in mobility. Denies headaches, seizures, numbness, or tingling. Denies depression, anxiety or insomnia. Denies skin break down or rash.   PE  BP 116/68 mmHg  Pulse 110  Resp 18  Ht '5\' 3"'$  (1.6 m)  Wt 207 lb (93.895 kg)  BMI 36.68 kg/m2  SpO2 90%  Patient alert and oriented and in no cardiopulmonary distress.  HEENT: No facial asymmetry, EOMI,   oropharynx pink and moist.  Neck supple no JVD, no mass.  Chest: Clear to auscultation bilaterally.  CVS: S1, S2 no murmurs, no S3.Regular rate.  ABD: Soft non tender.   Ext: No edema  MS: Adequate ROM spine, shoulders, hips and knees.  Skin: Intact, no ulcerations or rash noted.  Psych: Good eye contact, normal affect. Memory intact not anxious or depressed appearing.  CNS: CN 2-12 intact, power,  normal throughout.no focal deficits noted.   Assessment & Plan   Essential  hypertension Controlled, no change in medication DASH diet and commitment to daily physical activity for a minimum of 30 minutes discussed and encouraged, as a part of hypertension management. The importance of attaining a healthy weight is also discussed.  BP/Weight 12/02/2014 06/28/2014 05/31/2014 03/01/2014 12/31/2013 10/28/2013 6/64/4034  Systolic BP 742 595 638 756 433 295 188  Diastolic BP 68 64 84 80 80 78 74  Wt. (Lbs) 207 - 218 206.8 208.12 198.8 210.4  BMI 36.68 - 38.63 36.64 36.88 35.22 37.28         GERD Controlled, no change in medication    Impaired fasting glucose Patient educated about the importance of limiting  Carbohydrate intake , the need to commit to daily physical activity for a minimum of 30 minutes , and to commit weight loss. The fact that changes in all these areas will reduce or eliminate all together the development of diabetes is stressed.   Diabetic Labs Latest Ref Rng 05/29/2014 10/28/2013 03/26/2013 11/08/2012 04/28/2012  HbA1c <5.7 % 5.9(H) 6.1(H) 5.5 - 6.0(H)  Chol 0 - 200 mg/dL 106 159 - 106 135  HDL >39 mg/dL 48 69 - 41 51  Calc LDL 0 - 99 mg/dL 32 57 - 35 44  Triglycerides <150 mg/dL 128 164(H) - 149 199(H)  Creatinine 0.50 - 1.10 mg/dL 0.88 0.92 1.08 1.08 1.00   BP/Weight 12/02/2014 06/28/2014 05/31/2014 03/01/2014 12/31/2013 10/28/2013 11/26/6061  Systolic BP 016 010 932 355 732 202 542  Diastolic BP 68 64 84 80 80 78 74  Wt. (Lbs)  207 - 218 206.8 208.12 198.8 210.4  BMI 36.68 - 38.63 36.64 36.88 35.22 37.28   No flowsheet data found. Updated lab needed       Back pain with sciatica Unchanged, chronic pain management as before    Hyperlipidemia LDL goal <100 Hyperlipidemia:Low fat diet discussed and encouraged.   Lipid Panel  Lab Results  Component Value Date   CHOL 106 05/29/2014   HDL 48 05/29/2014   LDLCALC 32 05/29/2014   LDLDIRECT 85 11/14/2007   TRIG 128 05/29/2014   CHOLHDL 2.2 05/29/2014      Updated lab needed  .    Morbid obesity Improved Patient re-educated about  the importance of commitment to a  minimum of 150 minutes of exercise per week.  The importance of healthy food choices with portion control discussed. Encouraged to start a food diary, count calories and to consider  joining a support group. Sample diet sheets offered. Goals set by the patient for the next several months.   Weight /BMI 12/02/2014 06/28/2014 05/31/2014  WEIGHT 207 lb - 218 lb  HEIGHT '5\' 3"'$  '5\' 3"'$  '5\' 3"'$   BMI 36.68 kg/m2 - 38.63 kg/m2    Current exercise per week 40 minutes.    Anxiety state Marked improvement on current medication , continue same   Depression Improved and current;ly controlled, doing extremely well on medication, continue same   Nicotine dependence Patient counseled for approximately 5 minutes regarding the health risks of ongoing nicotine use, specifically all types of cancer, heart disease, stroke and respiratory failure. The options available for help with cessation ,the behavioral changes to assist the process, and the option to either gradully reduce usage  Or abruptly stop.is also discussed. Pt is also encouraged to set specific goals in number of cigarettes used daily, as well as to set a quit date.  Number of cigarettes/cigars currently smoking daily: 3         Review of Systems     Objective:   Physical Exam        Assessment & Plan:

## 2014-12-02 NOTE — Patient Instructions (Signed)
Annual physical exam in 4 month, call if you need me before  [place] on weight loss, please commit to 30 minutes total of physical activity for health every f day, if you work on it , it will happen, you know this  PLEASE call Dr Rourke's office and set up and keep appt for your colonoscopy, you say you will have this in June, and I encourage you to follow through, [place]  Topical cream prescribed as requested  Fasting labes this week please  All the best Thanks for choosing [facility], we consider it a privelige to serve you.  Bone density test is needed, please let nurse know that you will have this so we can schedule

## 2014-12-05 DIAGNOSIS — Z87891 Personal history of nicotine dependence: Secondary | ICD-10-CM | POA: Insufficient documentation

## 2014-12-05 NOTE — Assessment & Plan Note (Signed)
Patient educated about the importance of limiting  Carbohydrate intake , the need to commit to daily physical activity for a minimum of 30 minutes , and to commit weight loss. The fact that changes in all these areas will reduce or eliminate all together the development of diabetes is stressed.   Diabetic Labs Latest Ref Rng 05/29/2014 10/28/2013 03/26/2013 11/08/2012 04/28/2012  HbA1c <5.7 % 5.9(H) 6.1(H) 5.5 - 6.0(H)  Chol 0 - 200 mg/dL 106 159 - 106 135  HDL >39 mg/dL 48 69 - 41 51  Calc LDL 0 - 99 mg/dL 32 57 - 35 44  Triglycerides <150 mg/dL 128 164(H) - 149 199(H)  Creatinine 0.50 - 1.10 mg/dL 0.88 0.92 1.08 1.08 1.00   BP/Weight 12/02/2014 06/28/2014 05/31/2014 03/01/2014 12/31/2013 10/28/2013 4/58/0998  Systolic BP 338 250 539 767 341 937 902  Diastolic BP 68 64 84 80 80 78 74  Wt. (Lbs) 207 - 218 206.8 208.12 198.8 210.4  BMI 36.68 - 38.63 36.64 36.88 35.22 37.28   No flowsheet data found. Updated lab needed

## 2014-12-05 NOTE — Assessment & Plan Note (Signed)
Hyperlipidemia:Low fat diet discussed and encouraged.   Lipid Panel  Lab Results  Component Value Date   CHOL 106 05/29/2014   HDL 48 05/29/2014   LDLCALC 32 05/29/2014   LDLDIRECT 85 11/14/2007   TRIG 128 05/29/2014   CHOLHDL 2.2 05/29/2014      Updated lab needed .

## 2014-12-05 NOTE — Assessment & Plan Note (Signed)
Controlled, no change in medication  

## 2014-12-05 NOTE — Assessment & Plan Note (Signed)
Improved and current;ly controlled, doing extremely well on medication, continue same

## 2014-12-05 NOTE — Assessment & Plan Note (Signed)
Improved Patient re-educated about  the importance of commitment to a  minimum of 150 minutes of exercise per week.  The importance of healthy food choices with portion control discussed. Encouraged to start a food diary, count calories and to consider  joining a support group. Sample diet sheets offered. Goals set by the patient for the next several months.   Weight /BMI 12/02/2014 06/28/2014 05/31/2014  WEIGHT 207 lb - 218 lb  HEIGHT '5\' 3"'$  '5\' 3"'$  '5\' 3"'$   BMI 36.68 kg/m2 - 38.63 kg/m2    Current exercise per week 40 minutes.

## 2014-12-05 NOTE — Assessment & Plan Note (Signed)
Unchanged, chronic pain management as before

## 2014-12-05 NOTE — Assessment & Plan Note (Signed)

## 2014-12-05 NOTE — Assessment & Plan Note (Signed)
Marked improvement on current medication , continue same

## 2014-12-05 NOTE — Assessment & Plan Note (Signed)
Controlled, no change in medication DASH diet and commitment to daily physical activity for a minimum of 30 minutes discussed and encouraged, as a part of hypertension management. The importance of attaining a healthy weight is also discussed.  BP/Weight 12/02/2014 06/28/2014 05/31/2014 03/01/2014 12/31/2013 10/28/2013 0/34/9179  Systolic BP 150 569 794 801 655 374 827  Diastolic BP 68 64 84 80 80 78 74  Wt. (Lbs) 207 - 218 206.8 208.12 198.8 210.4  BMI 36.68 - 38.63 36.64 36.88 35.22 37.28

## 2014-12-15 ENCOUNTER — Other Ambulatory Visit: Payer: Self-pay | Admitting: Family Medicine

## 2014-12-23 ENCOUNTER — Other Ambulatory Visit: Payer: Self-pay

## 2014-12-23 DIAGNOSIS — R059 Cough, unspecified: Secondary | ICD-10-CM

## 2014-12-23 DIAGNOSIS — R05 Cough: Secondary | ICD-10-CM

## 2014-12-23 MED ORDER — PROMETHAZINE-DM 6.25-15 MG/5ML PO SYRP: 5.0000 mL | ORAL_SOLUTION | Freq: Every evening | ORAL | Status: AC | PRN

## 2014-12-23 MED ORDER — HYDROCODONE-ACETAMINOPHEN 5-325 MG PO TABS: ORAL_TABLET | ORAL | Status: DC

## 2015-01-01 ENCOUNTER — Other Ambulatory Visit: Payer: Self-pay | Admitting: Family Medicine

## 2015-01-11 ENCOUNTER — Telehealth: Payer: Self-pay | Admitting: Family Medicine

## 2015-01-11 MED ORDER — EPINEPHRINE 0.3 MG/0.3ML IJ SOAJ: INTRAMUSCULAR | Status: DC

## 2015-01-11 NOTE — Telephone Encounter (Signed)
Epi pen sent

## 2015-01-12 ENCOUNTER — Other Ambulatory Visit: Payer: Self-pay

## 2015-01-12 MED ORDER — HYDROCODONE-ACETAMINOPHEN 5-325 MG PO TABS: ORAL_TABLET | ORAL | Status: DC

## 2015-01-17 ENCOUNTER — Other Ambulatory Visit: Payer: Self-pay | Admitting: Family Medicine

## 2015-02-07 ENCOUNTER — Other Ambulatory Visit: Payer: Self-pay | Admitting: Family Medicine

## 2015-02-15 ENCOUNTER — Other Ambulatory Visit: Payer: Self-pay | Admitting: Family Medicine

## 2015-02-25 ENCOUNTER — Other Ambulatory Visit: Payer: Self-pay

## 2015-02-25 MED ORDER — HYDROCODONE-ACETAMINOPHEN 5-325 MG PO TABS
ORAL_TABLET | ORAL | Status: DC
Start: 2015-02-25 — End: 2015-03-25

## 2015-02-28 ENCOUNTER — Other Ambulatory Visit: Payer: Self-pay | Admitting: Family Medicine

## 2015-03-21 ENCOUNTER — Other Ambulatory Visit: Payer: Self-pay | Admitting: Family Medicine

## 2015-03-25 ENCOUNTER — Other Ambulatory Visit: Payer: Self-pay

## 2015-03-25 MED ORDER — HYDROCODONE-ACETAMINOPHEN 5-325 MG PO TABS: ORAL_TABLET | ORAL | Status: DC

## 2015-04-12 ENCOUNTER — Encounter: Payer: Self-pay | Admitting: Family Medicine

## 2015-04-12 ENCOUNTER — Ambulatory Visit (INDEPENDENT_AMBULATORY_CARE_PROVIDER_SITE_OTHER): Payer: PPO | Admitting: Family Medicine

## 2015-04-12 ENCOUNTER — Other Ambulatory Visit (HOSPITAL_COMMUNITY)
Admission: RE | Admit: 2015-04-12 | Discharge: 2015-04-12 | Disposition: A | Discharge status: Home / Self Care | Payer: PPO | Source: Ambulatory Visit | Attending: Family Medicine | Admitting: Family Medicine

## 2015-04-12 VITALS — BP 140/68 | HR 101 | Resp 16 | Ht 63.0 in | Wt 224.0 lb

## 2015-04-12 DIAGNOSIS — Z124 Encounter for screening for malignant neoplasm of cervix: Secondary | ICD-10-CM | POA: Insufficient documentation

## 2015-04-12 DIAGNOSIS — R7301 Impaired fasting glucose: Secondary | ICD-10-CM

## 2015-04-12 DIAGNOSIS — M5440 Lumbago with sciatica, unspecified side: Secondary | ICD-10-CM | POA: Diagnosis not present

## 2015-04-12 DIAGNOSIS — E785 Hyperlipidemia, unspecified: Secondary | ICD-10-CM

## 2015-04-12 DIAGNOSIS — I10 Essential (primary) hypertension: Secondary | ICD-10-CM | POA: Diagnosis not present

## 2015-04-12 DIAGNOSIS — Z0000 Encounter for general adult medical examination without abnormal findings: Secondary | ICD-10-CM | POA: Diagnosis not present

## 2015-04-12 DIAGNOSIS — Z23 Encounter for immunization: Secondary | ICD-10-CM

## 2015-04-12 DIAGNOSIS — F17208 Nicotine dependence, unspecified, with other nicotine-induced disorders: Secondary | ICD-10-CM

## 2015-04-12 DIAGNOSIS — Z1211 Encounter for screening for malignant neoplasm of colon: Secondary | ICD-10-CM | POA: Diagnosis not present

## 2015-04-12 LAB — POC HEMOCCULT BLD/STL (OFFICE/1-CARD/DIAGNOSTIC): FECAL OCCULT BLD: NEGATIVE

## 2015-04-12 MED ORDER — PREDNISONE 5 MG (21) PO TBPK: 5.0000 mg | ORAL_TABLET | Freq: Every day | ORAL | Status: DC

## 2015-04-12 MED ORDER — METHYLPREDNISOLONE ACETATE 80 MG/ML IJ SUSP
80.0000 mg | Freq: Once | INTRAMUSCULAR | Status: AC
  Administered 2015-04-12: 80 mg via INTRAMUSCULAR

## 2015-04-12 MED ORDER — KETOROLAC TROMETHAMINE 60 MG/2ML IM SOLN
60.0000 mg | Freq: Once | INTRAMUSCULAR | Status: AC
  Administered 2015-04-12: 60 mg via INTRAMUSCULAR

## 2015-04-12 NOTE — Patient Instructions (Addendum)
F/u early Januarty, call if you need me before  Fklu vaccine today, Injections and medication today for back pain  CALL your ins to find out cost of colonoscopy, you NEED this please   Stop drinking sweetened drinks please this is the cause of weight gain, and start going to fitness center at least 3 to 4 dayes per week  Please work on good  health habits so that your health will improve. 1. Commitment to daily physical activity for 30 to 60  minutes, if you are able to do this.  2. Commitment to wise food choices. Aim for half of your  food intake to be vegetable and fruit, one quarter starchy foods, and one quarter protein. Try to eat on a regular schedule  3 meals per day, snacking between meals should be limited to vegetables or fruits or small portions of nuts. 64 ounces of water per day is generally recommended, unless you have specific health conditions, like heart failure or kidney failure where you will need to limit fluid intake.  3. Commitment to sufficient and a  good quality of physical and mental rest daily, generally between 6 to 8 hours per day.  WITH PERSISTANCE AND PERSEVERANCE, THE IMPOSSIBLE , BECOMES THE NORM!  Thanks for choosing [facility], we consider it a privelige to serve you. Blood sugar has increased to d8abetic range which is NEW for you, youNEED to change what you eat and esp drinkl and lose weight  , also pls reduce fat in diet  HBA1C, fasting lipid and cmp and EGFR for Jan appt 

## 2015-04-12 NOTE — Progress Notes (Signed)
Subjective:    Patient ID: [name], female    DOB: October 03, 1945, 69 y.o.   MRN: [id number]  HPI Patient is in for annual physical exam. C/o increased back pain following a fall several months ago, wants injections for this Still deferring colonoscopy , reportedly because of cost, I have again asked her to check cost with her insurance carrier. Denies any change in stool character, black stool or rectal blood Immunization is reviewed , and  updated if needed.    Review of Systems See HPI     Objective:   Physical Exam BP 140/68 mmHg  Pulse 101  Resp 16  Ht '5\' 3"'$  (1.6 m)  Wt 224 lb (101.606 kg)  BMI 39.69 kg/m2  SpO2 95%  Pleasant well nourished female, alert and oriented x 3, in no cardio-pulmonary distress. Afebrile. HEENT No facial trauma or asymetry. Sinuses non tender.  Extra occullar muscles intact, pupils equally reactive to light. External ears normal, tympanic membranes clear. Oropharynx moist, no exudate, fair dentition. Neck: supple, no adenopathy,JVD or thyromegaly.No bruits.  Chest: Clear to ascultation bilaterally.No crackles or wheezes. Non tender to palpation  Breast: No asymetry,no masses or lumps. No tenderness. No nipple discharge or inversion. No axillary or supraclavicular adenopathy  Cardiovascular system; Heart sounds normal,  S1 and  S2 ,no S3.  No murmur, or thrill. Apical beat not displaced Peripheral pulses normal.  Abdomen: Soft, non tender, no organomegaly or masses. No bruits. Bowel sounds normal. No guarding, tenderness or rebound.  Rectal:  Normal sphincter tone. No mass.No rectal masses.  Guaiac negative stool.  GU: External genitalia normal female genitalia , female distribution of hair. No lesions. Urethral meatus normal in size, no  Prolapse, no lesions visibly  Present. Bladder non tender. Vagina pink and moist , with no visible lesions , discharge present . Adequate pelvic support no  cystocele or rectocele  noted Cervix pink and appears healthy, no lesions or ulcerations noted, no discharge noted from os Uterus absent, no adnexal masses, no  adnexal tenderness.   Musculoskeletal exam: Decreased ROM lumbar spine, adequate in hips, shoulders and knees No  deformity ,swelling or crepitus noted. No muscle wasting or atrophy.   Neurologic: Cranial nerves 2 to 12 intact. Power, tone ,sensation and reflexes normal throughout. No disturbance in gait. No tremor.  Skin: Intact, no ulceration, erythema , scaling or rash noted. Pigmentation normal throughout  Psych; Normal mood and affect. Judgement and concentration normal        Assessment & Plan:  Annual physical exam Annual exam as documented. Counseling done  re healthy lifestyle involving commitment to 150 minutes exercise per week, heart healthy diet, and attaining healthy weight.The importance of adequate sleep also discussed. Regular seat belt use and home safety, is also discussed. Changes in health habits are decided on by the patient with goals and time frames  set for achieving them. Immunization and cancer screening needs are specifically addressed at this visit.   Nicotine dependence Patient counseled for approximately 5 minutes regarding the health risks of ongoing nicotine use, specifically all types of cancer, heart disease, stroke and respiratory failure. The options available for help with cessation ,the behavioral changes to assist the process, and the option to either gradully reduce usage  Or abruptly stop.is also discussed. Pt is also encouraged to set specific goals in number of cigarettes used daily, as well as to set a quit date.  Number of cigarettes/cigars currently smoking daily: 3   Back pain with  sciatica Uncontrolled.Toradol and depo medrol administered IM in the office , to be followed by a short course of oral prednisone and NSAIDS.   Need for prophylactic vaccination and inoculation against  influenza After obtaining informed consent, the vaccine is  administered by LPN.

## 2015-04-14 LAB — CYTOLOGY - PAP

## 2015-04-18 DIAGNOSIS — Z23 Encounter for immunization: Secondary | ICD-10-CM | POA: Insufficient documentation

## 2015-04-18 NOTE — Assessment & Plan Note (Signed)
Uncontrolled.Toradol and depo medrol administered IM in the office , to be followed by a short course of oral prednisone and NSAIDS.  

## 2015-04-18 NOTE — Assessment & Plan Note (Signed)
After obtaining informed consent, the vaccine is  administered by LPN.  

## 2015-04-18 NOTE — Assessment & Plan Note (Signed)

## 2015-04-18 NOTE — Assessment & Plan Note (Addendum)

## 2015-04-21 ENCOUNTER — Other Ambulatory Visit: Payer: Self-pay | Admitting: Family Medicine

## 2015-04-22 ENCOUNTER — Other Ambulatory Visit: Payer: Self-pay

## 2015-04-22 MED ORDER — HYDROCODONE-ACETAMINOPHEN 5-325 MG PO TABS: ORAL_TABLET | ORAL | Status: DC

## 2015-04-26 ENCOUNTER — Other Ambulatory Visit: Payer: Self-pay | Admitting: Family Medicine

## 2015-05-04 ENCOUNTER — Encounter: Payer: Self-pay | Admitting: Family Medicine

## 2015-05-13 ENCOUNTER — Other Ambulatory Visit: Payer: Self-pay

## 2015-05-13 MED ORDER — HYDROCODONE-ACETAMINOPHEN 5-325 MG PO TABS: ORAL_TABLET | ORAL | Status: DC

## 2015-05-24 ENCOUNTER — Other Ambulatory Visit: Payer: Self-pay | Admitting: Family Medicine

## 2015-06-17 ENCOUNTER — Other Ambulatory Visit: Payer: Self-pay

## 2015-06-17 MED ORDER — HYDROCODONE-ACETAMINOPHEN 5-325 MG PO TABS: ORAL_TABLET | ORAL | Status: DC

## 2015-07-15 ENCOUNTER — Other Ambulatory Visit: Payer: Self-pay

## 2015-07-15 MED ORDER — HYDROCODONE-ACETAMINOPHEN 5-325 MG PO TABS: ORAL_TABLET | ORAL | Status: DC

## 2015-07-16 ENCOUNTER — Other Ambulatory Visit: Payer: Self-pay | Admitting: Family Medicine

## 2015-08-11 ENCOUNTER — Other Ambulatory Visit: Payer: Self-pay | Admitting: Family Medicine

## 2015-08-12 ENCOUNTER — Other Ambulatory Visit: Payer: Self-pay

## 2015-08-12 MED ORDER — POTASSIUM CHLORIDE CRYS ER 20 MEQ PO TBCR: 20.0000 meq | EXTENDED_RELEASE_TABLET | Freq: Every day | ORAL | Status: DC

## 2015-08-12 MED ORDER — HYDROCODONE-ACETAMINOPHEN 5-325 MG PO TABS: ORAL_TABLET | ORAL | Status: DC

## 2015-08-12 MED ORDER — ROSUVASTATIN CALCIUM 40 MG PO TABS: 40.0000 mg | ORAL_TABLET | Freq: Every day | ORAL | Status: DC

## 2015-08-17 ENCOUNTER — Other Ambulatory Visit: Payer: Self-pay | Admitting: Family Medicine

## 2015-08-17 ENCOUNTER — Other Ambulatory Visit: Payer: Self-pay

## 2015-08-17 MED ORDER — FUROSEMIDE 20 MG PO TABS: 20.0000 mg | ORAL_TABLET | Freq: Two times a day (BID) | ORAL | Status: DC

## 2015-08-17 MED ORDER — DILTIAZEM HCL ER COATED BEADS 120 MG PO CP24
120.0000 mg | ORAL_CAPSULE | Freq: Every day | ORAL | Status: DC
Start: 2015-08-17 — End: 2015-10-19

## 2015-08-17 MED ORDER — ALPRAZOLAM 1 MG PO TABS: 1.0000 mg | ORAL_TABLET | Freq: Two times a day (BID) | ORAL | Status: DC

## 2015-08-17 MED ORDER — CELECOXIB 400 MG PO CAPS: 400.0000 mg | ORAL_CAPSULE | Freq: Every day | ORAL | Status: DC

## 2015-08-30 ENCOUNTER — Ambulatory Visit: Payer: Self-pay | Admitting: Family Medicine

## 2015-09-09 ENCOUNTER — Other Ambulatory Visit: Payer: Self-pay

## 2015-09-09 MED ORDER — HYDROCODONE-ACETAMINOPHEN 5-325 MG PO TABS: ORAL_TABLET | ORAL | Status: DC

## 2015-09-12 ENCOUNTER — Telehealth: Payer: Self-pay | Admitting: Family Medicine

## 2015-09-12 ENCOUNTER — Other Ambulatory Visit: Payer: Self-pay | Admitting: Family Medicine

## 2015-09-12 NOTE — Telephone Encounter (Signed)
Patient is requesting a med refill on her ALPRAZolam (XANAX) 1 MG tablet , please advise?

## 2015-09-12 NOTE — Telephone Encounter (Signed)
Patient aware she has refills

## 2015-10-07 ENCOUNTER — Other Ambulatory Visit: Payer: Self-pay

## 2015-10-07 MED ORDER — HYDROCODONE-ACETAMINOPHEN 5-325 MG PO TABS: ORAL_TABLET | ORAL | Status: DC

## 2015-10-14 ENCOUNTER — Other Ambulatory Visit: Payer: Self-pay | Admitting: Family Medicine

## 2015-10-14 ENCOUNTER — Ambulatory Visit (INDEPENDENT_AMBULATORY_CARE_PROVIDER_SITE_OTHER): Payer: PPO | Admitting: Family Medicine

## 2015-10-14 ENCOUNTER — Encounter: Payer: Self-pay | Admitting: Family Medicine

## 2015-10-14 VITALS — BP 122/70 | HR 87 | Resp 16 | Ht 63.0 in | Wt 225.0 lb

## 2015-10-14 DIAGNOSIS — D539 Nutritional anemia, unspecified: Secondary | ICD-10-CM | POA: Diagnosis not present

## 2015-10-14 DIAGNOSIS — Z23 Encounter for immunization: Secondary | ICD-10-CM | POA: Diagnosis not present

## 2015-10-14 DIAGNOSIS — J449 Chronic obstructive pulmonary disease, unspecified: Secondary | ICD-10-CM

## 2015-10-14 DIAGNOSIS — M5440 Lumbago with sciatica, unspecified side: Secondary | ICD-10-CM

## 2015-10-14 DIAGNOSIS — I10 Essential (primary) hypertension: Secondary | ICD-10-CM

## 2015-10-14 DIAGNOSIS — F17218 Nicotine dependence, cigarettes, with other nicotine-induced disorders: Secondary | ICD-10-CM

## 2015-10-14 DIAGNOSIS — E785 Hyperlipidemia, unspecified: Secondary | ICD-10-CM | POA: Diagnosis not present

## 2015-10-14 DIAGNOSIS — R7301 Impaired fasting glucose: Secondary | ICD-10-CM

## 2015-10-14 DIAGNOSIS — E559 Vitamin D deficiency, unspecified: Secondary | ICD-10-CM

## 2015-10-14 LAB — CBC
HCT: 35.8 % — ABNORMAL LOW (ref 36.0–46.0)
HEMOGLOBIN: 11.1 g/dL — AB (ref 12.0–15.0)
MCH: 25.2 pg — AB (ref 26.0–34.0)
MCHC: 31 g/dL (ref 30.0–36.0)
MCV: 81.2 fL (ref 78.0–100.0)
MPV: 10.2 fL (ref 8.6–12.4)
PLATELETS: 244 10*3/uL (ref 150–400)
RBC: 4.41 MIL/uL (ref 3.87–5.11)
RDW: 15.3 % (ref 11.5–15.5)
WBC: 4.3 10*3/uL (ref 4.0–10.5)

## 2015-10-14 LAB — LIPID PANEL
CHOL/HDL RATIO: 2.8 ratio (ref <=5.0)
Cholesterol: 134 mg/dL (ref 125–200)
HDL: 48 mg/dL (ref >=46)
LDL CALC: 56 mg/dL (ref <130)
TRIGLYCERIDES: 151 mg/dL — AB (ref <150)
VLDL: 30 mg/dL (ref <30)

## 2015-10-14 LAB — COMPREHENSIVE METABOLIC PANEL
ALK PHOS: 80 U/L (ref 33–130)
ALT: 7 U/L (ref 6–29)
AST: 15 U/L (ref 10–35)
Albumin: 4.1 g/dL (ref 3.6–5.1)
BUN: 13 mg/dL (ref 7–25)
CALCIUM: 9 mg/dL (ref 8.6–10.4)
CHLORIDE: 101 mmol/L (ref 98–110)
CO2: 30 mmol/L (ref 20–31)
Creat: 0.92 mg/dL (ref 0.50–0.99)
GLUCOSE: 95 mg/dL (ref 65–99)
POTASSIUM: 4.2 mmol/L (ref 3.5–5.3)
Sodium: 142 mmol/L (ref 135–146)
Total Bilirubin: 0.4 mg/dL (ref 0.2–1.2)
Total Protein: 6.6 g/dL (ref 6.1–8.1)

## 2015-10-14 LAB — TSH: TSH: 0.59 mIU/L

## 2015-10-14 MED ORDER — PHENTERMINE HCL 37.5 MG PO TABS: 37.5000 mg | ORAL_TABLET | Freq: Every day | ORAL | Status: DC

## 2015-10-14 NOTE — Progress Notes (Signed)
Subjective:    Patient ID: [name], female    DOB: 15-Feb-1946, 70 y.o.   MRN: [id number]  HPI   [name]     MRN: [id number]      DOB: February 03, 1946   HPI [name] is here for follow up and re-evaluation of chronic medical conditions, medication management and review of any available recent lab and radiology data.  Preventive health is updated, specifically  Cancer screening and Immunization.   Questions or concerns regarding consultations or procedures which the PT has had in the interim are  addressed. The PT denies any adverse reactions to current medications since the last visit.  There are no new concerns.  There are no specific complaints   ROS Denies recent fever or chills. Denies sinus pressure, nasal congestion, ear pain or sore throat. Denies chest congestion, productive cough or wheezing. Denies chest pains, palpitations and leg swelling Denies abdominal pain, nausea, vomiting,diarrhea or constipation.   Denies dysuria, frequency, hesitancy or incontinence. Denies joint pain, swelling and limitation in mobility. Denies headaches, seizures, numbness, or tingling. Denies depression, anxiety or insomnia. Denies skin break down or rash.   PE  BP 122/70 mmHg  Pulse 87  Resp 16  Ht '5\' 3"'$  (1.6 m)  Wt 225 lb (102.059 kg)  BMI 39.87 kg/m2  SpO2 93%  Patient alert and oriented and in no cardiopulmonary distress.  HEENT: No facial asymmetry, EOMI,   oropharynx pink and moist.  Neck supple no JVD, no mass.  Chest: Clear to auscultation bilaterally.decreased air entry throughout CVS: S1, S2 no murmurs, no S3.Regular rate.  ABD: Soft non tender.   Ext: No edema  MS: Adequate though reduced  ROM spine, normal in  shoulders, hips and knees.  Skin: Intact, no ulcerations or rash noted.  Psych: Good eye contact, normal affect. Memory intact not anxious or depressed appearing.  CNS: CN 2-12 intact, power,  normal throughout.no focal deficits  noted.   Assessment & Plan   Essential hypertension Controlled, no change in medication DASH diet and commitment to daily physical activity for a minimum of 30 minutes discussed and encouraged, as a part of hypertension management. The importance of attaining a healthy weight is also discussed.  BP/Weight 10/14/2015 04/12/2015 12/02/2014 06/28/2014 05/31/2014 03/01/2014 1/75/1025  Systolic BP 852 778 242 353 614 431 540  Diastolic BP 70 68 68 64 84 80 80  Wt. (Lbs) 225 224 207 - 218 206.8 208.12  BMI 39.87 39.69 36.68 - 38.63 36.64 36.88        Impaired fasting glucose Patient educated about the importance of limiting  Carbohydrate intake , the need to commit to daily physical activity for a minimum of 30 minutes , and to commit weight loss. The fact that changes in all these areas will reduce or eliminate all together the development of diabetes is stressed.   Diabetic Labs Latest Ref Rng 10/14/2015 05/29/2014 10/28/2013 03/26/2013 11/08/2012  HbA1c <5.7 % - 5.9(H) 6.1(H) 5.5 -  Chol 125 - 200 mg/dL 134 106 159 - 106  HDL >=46 mg/dL 48 48 69 - 41  Calc LDL <130 mg/dL 56 32 57 - 35  Triglycerides <150 mg/dL 151(H) 128 164(H) - 149  Creatinine 0.50 - 0.99 mg/dL 0.92 0.88 0.92 1.08 1.08   BP/Weight 10/14/2015 04/12/2015 12/02/2014 06/28/2014 05/31/2014 03/01/2014 0/86/7619  Systolic BP 509 326 712 458 099 833 825  Diastolic BP 70 68 68 64 84 80 80  Wt. (Lbs) 225 224 207 -  218 206.8 208.12  BMI 39.87 39.69 36.68 - 38.63 36.64 36.88   No flowsheet data found. Updated lab needed at/ before next visit.     Morbid obesity Deteriorated. Patient re-educated about  the importance of commitment to a  minimum of 150 minutes of exercise per week.  The importance of healthy food choices with portion control discussed. Encouraged to start a food diary, count calories and to consider  joining a support group. Sample diet sheets offered. Goals set by the patient for the next several months.    Weight /BMI 10/14/2015 04/12/2015 12/02/2014  WEIGHT 225 lb 224 lb 207 lb  HEIGHT '5\' 3"'$  '5\' 3"'$  '5\' 3"'$   BMI 39.87 kg/m2 39.69 kg/m2 36.68 kg/m2    Current exercise per week 30 minutes. Resume half phentermine daily  Nicotine dependence Patient counseled for approximately 5 minutes regarding the health risks of ongoing nicotine use, specifically all types of cancer, heart disease, stroke and respiratory failure. The options available for help with cessation ,the behavioral changes to assist the process, and the option to either gradully reduce usage  Or abruptly stop.is also discussed. Pt is also encouraged to set specific goals in number of cigarettes used daily, as well as to set a quit date.  Number of cigarettes/cigars currently smoking daily:3 to 5 QUIT DATE : day of visit, hypoxic with activity   Vitamin D deficiency Needs to commit to supplement, remains low  Back pain with sciatica Worsening with weight gain, no med change, weight loss a real and necessary goal  COPD (chronic obstructive pulmonary disease) (HCC) Worsening, now hypoxic with activity.Commitment made at visit to entirely discontinue nicotine effective immediately Will need screening chest scan also. Weight loss will improve oxygenation     Review of Systems     Objective:   Physical Exam        Assessment & Plan:

## 2015-10-14 NOTE — Patient Instructions (Signed)
F/u in 3.5 month, call if you need me sooner  NO MORE CIGARETTES AS AGREED UPON  Labs today  Pneumonia vaccine today  Mammogram needs to be done, it is past due  HALF NOT the WHOLE phentermine daily, and eat on schedule  Weight loss goal of 10 pounds

## 2015-10-15 LAB — VITAMIN D 25 HYDROXY (VIT D DEFICIENCY, FRACTURES): VIT D 25 HYDROXY: 14 ng/mL — AB (ref 30–100)

## 2015-10-16 DIAGNOSIS — J441 Chronic obstructive pulmonary disease with (acute) exacerbation: Secondary | ICD-10-CM | POA: Insufficient documentation

## 2015-10-16 NOTE — Assessment & Plan Note (Signed)
Patient counseled for approximately 5 minutes regarding the health risks of ongoing nicotine use, specifically all types of cancer, heart disease, stroke and respiratory failure. The options available for help with cessation ,the behavioral changes to assist the process, and the option to either gradully reduce usage  Or abruptly stop.is also discussed. Pt is also encouraged to set specific goals in number of cigarettes used daily, as well as to set a quit date.  Number of cigarettes/cigars currently smoking daily:3 to 5 QUIT DATE : day of visit, hypoxic with activity

## 2015-10-16 NOTE — Assessment & Plan Note (Signed)
Worsening, now hypoxic with activity.Commitment made at visit to entirely discontinue nicotine effective immediately Will need screening chest scan also. Weight loss will improve oxygenation

## 2015-10-16 NOTE — Assessment & Plan Note (Signed)
Controlled, no change in medication DASH diet and commitment to daily physical activity for a minimum of 30 minutes discussed and encouraged, as a part of hypertension management. The importance of attaining a healthy weight is also discussed.  BP/Weight 10/14/2015 04/12/2015 12/02/2014 06/28/2014 05/31/2014 03/01/2014 0/63/0160  Systolic BP 109 323 557 322 025 427 062  Diastolic BP 70 68 68 64 84 80 80  Wt. (Lbs) 225 224 207 - 218 206.8 208.12  BMI 39.87 39.69 36.68 - 38.63 36.64 36.88

## 2015-10-16 NOTE — Assessment & Plan Note (Signed)
Patient educated about the importance of limiting  Carbohydrate intake , the need to commit to daily physical activity for a minimum of 30 minutes , and to commit weight loss. The fact that changes in all these areas will reduce or eliminate all together the development of diabetes is stressed.   Diabetic Labs Latest Ref Rng 10/14/2015 05/29/2014 10/28/2013 03/26/2013 11/08/2012  HbA1c <5.7 % - 5.9(H) 6.1(H) 5.5 -  Chol 125 - 200 mg/dL 134 106 159 - 106  HDL >=46 mg/dL 48 48 69 - 41  Calc LDL <130 mg/dL 56 32 57 - 35  Triglycerides <150 mg/dL 151(H) 128 164(H) - 149  Creatinine 0.50 - 0.99 mg/dL 0.92 0.88 0.92 1.08 1.08   BP/Weight 10/14/2015 04/12/2015 12/02/2014 06/28/2014 05/31/2014 03/01/2014 04/06/538  Systolic BP 767 341 937 902 409 735 329  Diastolic BP 70 68 68 64 84 80 80  Wt. (Lbs) 225 224 207 - 218 206.8 208.12  BMI 39.87 39.69 36.68 - 38.63 36.64 36.88   No flowsheet data found. Updated lab needed at/ before next visit.

## 2015-10-16 NOTE — Assessment & Plan Note (Signed)
Deteriorated. Patient re-educated about  the importance of commitment to a  minimum of 150 minutes of exercise per week.  The importance of healthy food choices with portion control discussed. Encouraged to start a food diary, count calories and to consider  joining a support group. Sample diet sheets offered. Goals set by the patient for the next several months.   Weight /BMI 10/14/2015 04/12/2015 12/02/2014  WEIGHT 225 lb 224 lb 207 lb  HEIGHT '5\' 3"'$  '5\' 3"'$  '5\' 3"'$   BMI 39.87 kg/m2 39.69 kg/m2 36.68 kg/m2    Current exercise per week 30 minutes. Resume half phentermine daily

## 2015-10-16 NOTE — Assessment & Plan Note (Signed)
Needs to commit to supplement, remains low

## 2015-10-16 NOTE — Assessment & Plan Note (Signed)
Worsening with weight gain, no med change, weight loss a real and necessary goal

## 2015-10-17 LAB — HEMOGLOBIN A1C
Hgb A1c MFr Bld: 6.4 % — ABNORMAL HIGH
Mean Plasma Glucose: 137 mg/dL — ABNORMAL HIGH

## 2015-10-18 LAB — FERRITIN: Ferritin: 16 ng/mL — ABNORMAL LOW (ref 20–288)

## 2015-10-18 LAB — IRON: Iron: 50 ug/dL (ref 45–160)

## 2015-10-19 ENCOUNTER — Other Ambulatory Visit: Payer: Self-pay

## 2015-10-19 MED ORDER — POTASSIUM CHLORIDE CRYS ER 20 MEQ PO TBCR: 20.0000 meq | EXTENDED_RELEASE_TABLET | Freq: Every day | ORAL | Status: DC

## 2015-10-19 MED ORDER — OMEPRAZOLE 40 MG PO CPDR: 40.0000 mg | DELAYED_RELEASE_CAPSULE | Freq: Every day | ORAL | Status: DC

## 2015-10-19 MED ORDER — CELECOXIB 400 MG PO CAPS: 400.0000 mg | ORAL_CAPSULE | Freq: Every day | ORAL | Status: DC

## 2015-10-19 MED ORDER — TIZANIDINE HCL 4 MG PO TABS: 4.0000 mg | ORAL_TABLET | Freq: Three times a day (TID) | ORAL | Status: DC

## 2015-10-19 MED ORDER — LISINOPRIL 10 MG PO TABS: 10.0000 mg | ORAL_TABLET | Freq: Every day | ORAL | Status: DC

## 2015-10-19 MED ORDER — ALPRAZOLAM 1 MG PO TABS: 1.0000 mg | ORAL_TABLET | Freq: Two times a day (BID) | ORAL | Status: DC

## 2015-10-19 MED ORDER — DILTIAZEM HCL ER COATED BEADS 120 MG PO CP24: 120.0000 mg | ORAL_CAPSULE | Freq: Every day | ORAL | Status: DC

## 2015-10-24 ENCOUNTER — Other Ambulatory Visit: Payer: Self-pay

## 2015-10-24 MED ORDER — VITAMIN D (ERGOCALCIFEROL) 1.25 MG (50000 UNIT) PO CAPS: 50000.0000 [IU] | ORAL_CAPSULE | ORAL | Status: DC

## 2015-10-27 ENCOUNTER — Other Ambulatory Visit: Payer: Self-pay

## 2015-10-27 DIAGNOSIS — Z1211 Encounter for screening for malignant neoplasm of colon: Secondary | ICD-10-CM

## 2015-10-28 ENCOUNTER — Encounter (INDEPENDENT_AMBULATORY_CARE_PROVIDER_SITE_OTHER): Payer: Self-pay | Admitting: *Deleted

## 2015-11-04 ENCOUNTER — Other Ambulatory Visit: Payer: Self-pay

## 2015-11-04 MED ORDER — HYDROCODONE-ACETAMINOPHEN 5-325 MG PO TABS: ORAL_TABLET | ORAL | Status: DC

## 2015-11-09 ENCOUNTER — Other Ambulatory Visit (INDEPENDENT_AMBULATORY_CARE_PROVIDER_SITE_OTHER): Payer: Self-pay | Admitting: *Deleted

## 2015-11-09 DIAGNOSIS — Z1211 Encounter for screening for malignant neoplasm of colon: Secondary | ICD-10-CM

## 2015-11-11 ENCOUNTER — Other Ambulatory Visit: Payer: Self-pay

## 2015-11-28 ENCOUNTER — Telehealth: Payer: Self-pay | Admitting: Family Medicine

## 2015-11-28 NOTE — Telephone Encounter (Signed)
Opened In Error

## 2015-11-29 ENCOUNTER — Other Ambulatory Visit: Payer: Self-pay

## 2015-11-29 MED ORDER — HYDROCODONE-ACETAMINOPHEN 5-325 MG PO TABS: ORAL_TABLET | ORAL | Status: DC

## 2015-12-08 ENCOUNTER — Other Ambulatory Visit: Payer: Self-pay

## 2015-12-08 MED ORDER — FUROSEMIDE 20 MG PO TABS: 20.0000 mg | ORAL_TABLET | Freq: Two times a day (BID) | ORAL | Status: DC

## 2015-12-08 MED ORDER — FLUOXETINE HCL 20 MG PO CAPS: ORAL_CAPSULE | ORAL | Status: DC

## 2015-12-08 MED ORDER — ROSUVASTATIN CALCIUM 40 MG PO TABS: 40.0000 mg | ORAL_TABLET | Freq: Every day | ORAL | Status: DC

## 2015-12-14 ENCOUNTER — Other Ambulatory Visit (INDEPENDENT_AMBULATORY_CARE_PROVIDER_SITE_OTHER): Payer: Self-pay | Admitting: *Deleted

## 2015-12-14 ENCOUNTER — Encounter (INDEPENDENT_AMBULATORY_CARE_PROVIDER_SITE_OTHER): Payer: Self-pay | Admitting: *Deleted

## 2015-12-14 NOTE — Telephone Encounter (Signed)
Patient needs trilyte 

## 2015-12-19 MED ORDER — PEG 3350-KCL-NA BICARB-NACL 420 G PO SOLR: 4000.0000 mL | Freq: Once | ORAL | Status: DC

## 2015-12-23 ENCOUNTER — Other Ambulatory Visit: Payer: Self-pay

## 2015-12-23 MED ORDER — HYDROCODONE-ACETAMINOPHEN 5-325 MG PO TABS
ORAL_TABLET | ORAL | Status: DC
Start: 2015-12-23 — End: 2016-01-27

## 2015-12-26 ENCOUNTER — Telehealth: Payer: Self-pay | Admitting: Family Medicine

## 2015-12-26 NOTE — Telephone Encounter (Signed)
Returned patient's call.  No ability to leave message, voicemail full.

## 2015-12-26 NOTE — Telephone Encounter (Signed)
Spoke with patient and she was calling about a concern with husband which has been sent to pcp for review.

## 2015-12-26 NOTE — Telephone Encounter (Signed)
Patient is asking to speak to the nurse asap, please advise?

## 2016-01-03 ENCOUNTER — Telehealth (INDEPENDENT_AMBULATORY_CARE_PROVIDER_SITE_OTHER): Payer: Self-pay | Admitting: *Deleted

## 2016-01-03 NOTE — Telephone Encounter (Signed)
Referring MD/PCP: simpson   Procedure: tcs  Reason/Indication:  screening  Has patient had this procedure before?  Yes, 10 yrs or more ago  If so, when, by whom and where?    Is there a family history of colon cancer?  no  Who?  What age when diagnosed?    Is patient diabetic?   no      Does patient have prosthetic heart valve or mechanical valve?  no  Do you have a pacemaker?  no  Has patient ever had endocarditis? no  Has patient had joint replacement within last 12 months?  no  Does patient tend to be constipated or take laxatives? no  Does patient have a history of alcohol/drug use?  no  Is patient on Coumadin, Plavix and/or Aspirin? yes  Medications: asa 81 mg daily, alprazolam 1 mg bid, celecoxib 400 mg 1 tab prn, diltiazem 120 mg, fluoxetine 20 mg daily, furosemide 20 mg bid, hydrocodone/acetaminophen 5/325 mg tid, albuterol, lisinopril 10 mg daily, omeprazole 40 mg daily, phentermine 37.5 mg 1/2 tab daily, potassium 20 meq daily, pro-air, rosuvastatin 40 daily, tizanidine 4 mg tid, vit d 50,000 units once a week, iron 325 mg daily  Allergies: codeine, pcn  Medication Adjustment: asa 2 days, iron 10 days  Procedure date & time: 02/02/16 at 930

## 2016-01-03 NOTE — Telephone Encounter (Signed)
agree

## 2016-01-13 ENCOUNTER — Other Ambulatory Visit: Payer: Self-pay

## 2016-01-13 MED ORDER — LISINOPRIL 10 MG PO TABS: 10.0000 mg | ORAL_TABLET | Freq: Every day | ORAL | Status: DC

## 2016-01-13 MED ORDER — EPINEPHRINE 0.3 MG/0.3ML IJ SOAJ: INTRAMUSCULAR | Status: DC

## 2016-01-27 ENCOUNTER — Ambulatory Visit: Payer: Self-pay | Admitting: Family Medicine

## 2016-01-27 ENCOUNTER — Other Ambulatory Visit: Payer: Self-pay

## 2016-01-27 MED ORDER — HYDROCODONE-ACETAMINOPHEN 5-325 MG PO TABS: ORAL_TABLET | ORAL | Status: DC

## 2016-02-02 ENCOUNTER — Encounter (HOSPITAL_COMMUNITY)
Admission: RE | Disposition: A | Discharge status: Home / Self Care | Payer: Self-pay | Source: Ambulatory Visit | Attending: Internal Medicine

## 2016-02-02 ENCOUNTER — Encounter (HOSPITAL_COMMUNITY): Payer: Self-pay | Admitting: *Deleted

## 2016-02-02 ENCOUNTER — Ambulatory Visit (HOSPITAL_COMMUNITY)
Admission: RE | Admit: 2016-02-02 | Discharge: 2016-02-02 | Disposition: A | Discharge status: Home / Self Care | Payer: PPO | Source: Ambulatory Visit | Attending: Internal Medicine | Admitting: Internal Medicine

## 2016-02-02 DIAGNOSIS — Z880 Allergy status to penicillin: Secondary | ICD-10-CM | POA: Diagnosis not present

## 2016-02-02 DIAGNOSIS — K6389 Other specified diseases of intestine: Secondary | ICD-10-CM | POA: Insufficient documentation

## 2016-02-02 DIAGNOSIS — D123 Benign neoplasm of transverse colon: Secondary | ICD-10-CM | POA: Diagnosis not present

## 2016-02-02 DIAGNOSIS — Z9079 Acquired absence of other genital organ(s): Secondary | ICD-10-CM | POA: Insufficient documentation

## 2016-02-02 DIAGNOSIS — F419 Anxiety disorder, unspecified: Secondary | ICD-10-CM | POA: Diagnosis not present

## 2016-02-02 DIAGNOSIS — E669 Obesity, unspecified: Secondary | ICD-10-CM | POA: Insufficient documentation

## 2016-02-02 DIAGNOSIS — E785 Hyperlipidemia, unspecified: Secondary | ICD-10-CM | POA: Diagnosis not present

## 2016-02-02 DIAGNOSIS — Z1211 Encounter for screening for malignant neoplasm of colon: Secondary | ICD-10-CM | POA: Insufficient documentation

## 2016-02-02 DIAGNOSIS — Z90722 Acquired absence of ovaries, bilateral: Secondary | ICD-10-CM | POA: Insufficient documentation

## 2016-02-02 DIAGNOSIS — Z885 Allergy status to narcotic agent status: Secondary | ICD-10-CM | POA: Insufficient documentation

## 2016-02-02 DIAGNOSIS — Z79899 Other long term (current) drug therapy: Secondary | ICD-10-CM | POA: Diagnosis not present

## 2016-02-02 DIAGNOSIS — Z7982 Long term (current) use of aspirin: Secondary | ICD-10-CM | POA: Diagnosis not present

## 2016-02-02 DIAGNOSIS — M1990 Unspecified osteoarthritis, unspecified site: Secondary | ICD-10-CM | POA: Diagnosis not present

## 2016-02-02 DIAGNOSIS — Z6839 Body mass index (BMI) 39.0-39.9, adult: Secondary | ICD-10-CM | POA: Insufficient documentation

## 2016-02-02 DIAGNOSIS — Z90710 Acquired absence of both cervix and uterus: Secondary | ICD-10-CM | POA: Insufficient documentation

## 2016-02-02 DIAGNOSIS — M549 Dorsalgia, unspecified: Secondary | ICD-10-CM | POA: Diagnosis not present

## 2016-02-02 DIAGNOSIS — K5730 Diverticulosis of large intestine without perforation or abscess without bleeding: Secondary | ICD-10-CM | POA: Diagnosis not present

## 2016-02-02 DIAGNOSIS — F17200 Nicotine dependence, unspecified, uncomplicated: Secondary | ICD-10-CM | POA: Diagnosis not present

## 2016-02-02 DIAGNOSIS — I10 Essential (primary) hypertension: Secondary | ICD-10-CM | POA: Insufficient documentation

## 2016-02-02 DIAGNOSIS — G8929 Other chronic pain: Secondary | ICD-10-CM | POA: Insufficient documentation

## 2016-02-02 DIAGNOSIS — Z9049 Acquired absence of other specified parts of digestive tract: Secondary | ICD-10-CM | POA: Diagnosis not present

## 2016-02-02 DIAGNOSIS — K219 Gastro-esophageal reflux disease without esophagitis: Secondary | ICD-10-CM | POA: Insufficient documentation

## 2016-02-02 HISTORY — PX: COLONOSCOPY: SHX5424

## 2016-02-02 SURGERY — COLONOSCOPY
Anesthesia: Moderate Sedation

## 2016-02-02 MED ORDER — MEPERIDINE HCL 50 MG/ML IJ SOLN
INTRAMUSCULAR | Status: DC | PRN
  Administered 2016-02-02 (×2): 25 mg via INTRAVENOUS

## 2016-02-02 MED ORDER — MEPERIDINE HCL 50 MG/ML IJ SOLN
INTRAMUSCULAR | Status: AC
  Filled 2016-02-02: qty 1

## 2016-02-02 MED ORDER — SODIUM CHLORIDE 0.9 % IV SOLN
INTRAVENOUS | Status: DC
  Administered 2016-02-02: 1000 mL via INTRAVENOUS

## 2016-02-02 MED ORDER — MIDAZOLAM HCL 5 MG/5ML IJ SOLN
INTRAMUSCULAR | Status: DC | PRN
  Administered 2016-02-02 (×2): 1 mg via INTRAVENOUS
  Administered 2016-02-02: 3 mg via INTRAVENOUS
  Administered 2016-02-02: 2 mg via INTRAVENOUS
  Administered 2016-02-02: 1 mg via INTRAVENOUS

## 2016-02-02 MED ORDER — MIDAZOLAM HCL 5 MG/5ML IJ SOLN
INTRAMUSCULAR | Status: AC
  Filled 2016-02-02: qty 10

## 2016-02-02 NOTE — Op Note (Addendum)
[facility] Patient Name: [name] Procedure Date: 02/02/2016 8:26 AM MRN: [id number] Date of Birth: 05/26/1946 Attending MD: Hildred Laser , MD CSN: [id number] Age: 70 Admit Type: Outpatient Procedure:                Colonoscopy Indications:              Screening for colorectal malignant neoplasm Providers:                Hildred Laser, MD, Janeece Riggers, RN, Georgeann Oppenheim,                            Technician Referring MD:             Norwood Levo. Moshe Cipro, MD Medicines:                Meperidine 50 mg IV, Midazolam 8 mg IV Complications:            No immediate complications. Estimated Blood Loss:     Estimated blood loss was minimal. Procedure:                Pre-Anesthesia Assessment:                           - Prior to the procedure, a History and Physical                            was performed, and patient medications and                            allergies were reviewed. The patient's tolerance of                            previous anesthesia was also reviewed. The risks                            and benefits of the procedure and the sedation                            options and risks were discussed with the patient.                            All questions were answered, and informed consent                            was obtained. Prior Anticoagulants: The patient                            last took aspirin 3 days prior to the procedure.                            ASA Grade Assessment: III - A patient with severe                            systemic disease. After reviewing the risks and  benefits, the patient was deemed in satisfactory                            condition to undergo the procedure.                           After obtaining informed consent, the colonoscope                            was passed under direct vision. Throughout the                            procedure, the patient's blood pressure, pulse, and                     oxygen saturations were monitored continuously. The                            [id number] ([id number]) scope was introduced through                            the anus and advanced to the the cecum, identified                            by appendiceal orifice and ileocecal valve. The                            colonoscopy was performed without difficulty. The                            patient tolerated the procedure well. The quality                            of the bowel preparation was good. The ileocecal                            valve, appendiceal orifice, and rectum were                            photographed. Scope In: 8:35:29 AM Scope Out: 8:57:51 AM Scope Withdrawal Time: 0 hours 9 minutes 38 seconds  Total Procedure Duration: 0 hours 22 minutes 22 seconds  Findings:      A 3 mm polyp was found in the hepatic flexure. The polyp was sessile.       The polyp was removed with a cold biopsy forceps. Resection and       retrieval were complete.      Scattered medium-mouthed diverticula were found in the sigmoid colon,       hepatic flexure and ascending colon.      A diffuse area of mild melanosis was found in the entire colon.      The retroflexed view of the distal rectum and anal verge was normal and       showed no anal or rectal abnormalities. Impression:               - One 3 mm polyp at the hepatic  flexure, removed                            with a cold biopsy forceps. Resected and retrieved.                           - Diverticulosis in the sigmoid colon, at the                            hepatic flexure and in the ascending colon.                           - Melanosis in the colon. Moderate Sedation:      Moderate (conscious) sedation was administered by the endoscopy nurse       and supervised by the endoscopist. The following parameters were       monitored: oxygen saturation, heart rate, blood pressure, CO2       capnography and response to care. Total  physician intraservice time was       27 minutes. Recommendation:           - Patient has a contact number available for                            emergencies. The signs and symptoms of potential                            delayed complications were discussed with the                            patient. Return to normal activities tomorrow.                            Written discharge instructions were provided to the                            patient.                           - Patient has a contact number available for                            emergencies. The signs and symptoms of potential                            delayed complications were discussed with the                            patient. Return to normal activities tomorrow.                            Written discharge instructions were provided to the                            patient.                           -  High fiber diet today.                           - Continue present medications.                           - Resume aspirin at prior dose today.                           - Await pathology results.                           - Repeat colonoscopy in 10 years for screening                            purposes. Procedure Code(s):        --- Professional ---                           [phone], Colonoscopy, flexible; with biopsy, single                            or multiple                           99152, Moderate sedation services provided by the                            same physician or other qualified health care                            professional performing the diagnostic or                            therapeutic service that the sedation supports,                            requiring the presence of an independent trained                            observer to assist in the monitoring of the                            patient's level of consciousness and physiological                            status;  initial 15 minutes of intraservice time,                            patient age 60 years or older                           [phone], Moderate sedation services; each additional                            15 minutes intraservice time Diagnosis Code(s):        ---  Professional ---                           Z12.11, Encounter for screening for malignant                            neoplasm of colon                           D12.3, Benign neoplasm of transverse colon (hepatic                            flexure or splenic flexure)                           K63.89, Other specified diseases of intestine                           K57.30, Diverticulosis of large intestine without                            perforation or abscess without bleeding CPT copyright 2016 [employer]. All rights reserved. The codes documented in this report are preliminary and upon coder review may  be revised to meet current compliance requirements. Hildred Laser, MD Hildred Laser, MD 02/02/2016 9:06:49 AM This report has been signed electronically. Number of Addenda: 0

## 2016-02-02 NOTE — H&P (Signed)
[name] is an 70 y.o. female.   Chief Complaint: Patient is here for colonoscopy. HPI: She was 69 year old African-American female who is here for screening colonoscopy. She denies abdominal pain change in bowel habits or rectal bleeding. Last exam was about 10 years ago. Family history is negative for CRC.  Past Medical History  Diagnosis Date  . Nicotine addiction   . Anxiety   . Chronic back pain   . Obesity   . Fracture of left ankle Jan 06, 2010    hairline/ Dr. Alfonso Ramus is treating   . Hyperlipidemia 1995  . Hypertension 1995  . GERD (gastroesophageal reflux disease) 2000  . Arthritis     Past Surgical History  Procedure Laterality Date  . Back surgery  1999  . Total abdominal hysterectomy w/ bilateral salpingoophorectomy  1984  . Left inguinal hernia herniorrhapy  1980  . Tonsillectomy    . Incisional hernia repair  August 17, 2008  . Removal of thmoma  03/27/2010    Dr. Arlyce Dice   . Spine surgery    . Cholecystectomy  2001    Family History  Problem Relation Age of Onset  . Heart disease Mother     enlarged  . Diabetes Mother   . Hypertension Mother   . Cancer Father     throat  . Hypertension Father   . Coronary artery disease Father   . Diabetes Sister     x3  . Asthma Sister   . Kidney disease Brother   . Stroke Brother    Social History:  reports that she has been smoking Cigarettes.  She has been smoking about 0.50 packs per day. She does not have any smokeless tobacco history on file. She reports that she does not drink alcohol or use illicit drugs.  Allergies:  Allergies  Allergen Reactions  . Codeine   . Penicillins Other (See Comments)    Blisters in mouth after dental work Has patient had a PCN reaction causing immediate rash, facial/tongue/throat swelling, SOB or lightheadedness with hypotension: Yes Has patient had a PCN reaction causing severe rash involving mucus membranes or skin necrosis: No Has patient had a PCN reaction that  required hospitalization No Has patient had a PCN reaction occurring within the last 10 years: Yes If all of the above answers are "NO", then may proceed with Cephalosporin use.     Medications Prior to Admission  Medication Sig Dispense Refill  . ALPRAZolam (XANAX) 1 MG tablet Take 1 tablet (1 mg total) by mouth 2 (two) times daily. 60 tablet 4  . aspirin (ASPIRIN LOW DOSE) 81 MG EC tablet Take 81 mg by mouth daily.      . celecoxib (CELEBREX) 400 MG capsule Take 1 capsule (400 mg total) by mouth daily. (Patient taking differently: Take 400 mg by mouth daily as needed for pain. ) 10 capsule 4  . diltiazem (CARDIZEM CD) 120 MG 24 hr capsule Take 1 capsule (120 mg total) by mouth daily. 30 capsule 6  . FLUoxetine (PROZAC) 20 MG capsule take 1 tablet once daily 30 capsule 3  . furosemide (LASIX) 20 MG tablet Take 1 tablet (20 mg total) by mouth 2 (two) times daily. 60 tablet 3  . HYDROcodone-acetaminophen (NORCO/VICODIN) 5-325 MG tablet TAKE ONE TABLET THREE TIMES A DAY 90 tablet 0  . lisinopril (PRINIVIL,ZESTRIL) 10 MG tablet Take 1 tablet (10 mg total) by mouth daily. 90 tablet 1  . omeprazole (PRILOSEC) 40 MG capsule Take 1 capsule (40  mg total) by mouth daily. 90 capsule 1  . phentermine (ADIPEX-P) 37.5 MG tablet Take 1 tablet (37.5 mg total) by mouth daily before breakfast. 30 tablet 1  . polyethylene glycol-electrolytes (NULYTELY/GOLYTELY) 420 g solution Take 4,000 mLs by mouth once. 4000 mL 0  . potassium chloride SA (K-DUR,KLOR-CON) 20 MEQ tablet Take 1 tablet (20 mEq total) by mouth daily. 30 tablet 6  . rosuvastatin (CRESTOR) 40 MG tablet Take 1 tablet (40 mg total) by mouth at bedtime. 30 tablet 3  . tiZANidine (ZANAFLEX) 4 MG tablet Take 1 tablet (4 mg total) by mouth 3 (three) times daily. 270 tablet 0  . Vitamin D, Ergocalciferol, (DRISDOL) 50000 units CAPS capsule Take 1 capsule (50,000 Units total) by mouth every 7 (seven) days. 4 capsule 5  . albuterol (PROVENTIL) (2.5 MG/3ML)  0.083% nebulizer solution INHALE ONE VIAL VIA NEBULIZER EVERY 6 HOURS AS NEEDED. (Patient taking differently: INHALE ONE VIAL VIA NEBULIZER EVERY 6 HOURS AS NEEDED SHORTNESS OF BREATH.) 375 mL 3  . EPINEPHrine (EPIPEN 2-PAK) 0.3 mg/0.3 mL IJ SOAJ injection INJECT INTO THE MUSCLE AS DIRECTED. 2 Device 2  . ipratropium-albuterol (DUONEB) 0.5-2.5 (3) MG/3ML SOLN Take 3 mLs by nebulization every 6 (six) hours as needed. 360 mL 0  . PROAIR HFA 108 (90 BASE) MCG/ACT inhaler UWSE 2 PUFFS EVERY 6 HOURS AS NEEDED FOR SHORTNESS OF BREATH. 8.5 g 1    No results found for this or any previous visit (from the past 48 hour(s)). No results found.  ROS  Blood pressure 146/67, pulse 100, temperature 97.8 F (36.6 C), temperature source Oral, resp. rate 19, height '5\' 3"'$  (1.6 m), weight 224 lb (101.606 kg), SpO2 95 %. Physical Exam  Constitutional: She appears well-developed and well-nourished.  HENT:  Mouth/Throat: Oropharynx is clear and moist.  Eyes: Conjunctivae are normal. No scleral icterus.  Neck: No thyromegaly present.  Cardiovascular: Normal rate and normal heart sounds.   No murmur heard. Respiratory: Effort normal and breath sounds normal.  Scar below inner aspect of right clavicle.  GI:  Abdomen is full with low midline scar. No organomegaly or masses.  Musculoskeletal: She exhibits no edema.  Lymphadenopathy:    She has no cervical adenopathy.  Neurological: She is alert.  Skin: Skin is warm and dry.     Assessment/Plan Average risk screening colonoscopy.  Hildred Laser, MD 02/02/2016, 8:26 AM

## 2016-02-02 NOTE — Discharge Instructions (Signed)
Resume usual medications and high fiber diet. No driving for 24 hours. Physician will call with biopsy results.      Colonoscopy, Care After These instructions give you information on caring for yourself after your procedure. Your doctor may also give you more specific instructions. Call your doctor if you have any problems or questions after your procedure. HOME CARE  Do not drive for 24 hours.  Do not sign important papers or use machinery for 24 hours.  You may shower.  You may go back to your usual activities, but go slower for the first 24 hours.  Take rest breaks often during the first 24 hours.  Walk around or use warm packs on your belly (abdomen) if you have belly cramping or gas.  Drink enough fluids to keep your pee (urine) clear or pale yellow.  Resume your normal diet. Avoid heavy or fried foods.  Avoid drinking alcohol for 24 hours or as told by your doctor.  Only take medicines as told by your doctor. If a tissue sample (biopsy) was taken during the procedure:   Do not take aspirin or blood thinners for 7 days, or as told by your doctor.  Do not drink alcohol for 7 days, or as told by your doctor.  Eat soft foods for the first 24 hours. GET HELP IF: You still have a small amount of blood in your poop (stool) 2-3 days after the procedure. GET HELP RIGHT AWAY IF:  You have more than a small amount of blood in your poop.  You see clumps of tissue (blood clots) in your poop.  Your belly is puffy (swollen).  You feel sick to your stomach (nauseous) or throw up (vomit).  You have a fever.  You have belly pain that gets worse and medicine does not help. MAKE SURE YOU:  Understand these instructions.  Will watch your condition.  Will get help right away if you are not doing well or get worse.   This information is not intended to replace advice given to you by your health care provider. Make sure you discuss any questions you have with your health  care provider.   Document Released: 09/01/2010 Document Revised: 08/04/2013 Document Reviewed: 04/06/2013 Elsevier Interactive Patient Education [employer].     Diverticulosis Diverticulosis is the condition that develops when small pouches (diverticula) form in the wall of your colon. Your colon, or large intestine, is where water is absorbed and stool is formed. The pouches form when the inside layer of your colon pushes through weak spots in the outer layers of your colon. CAUSES  No one knows exactly what causes diverticulosis. RISK FACTORS  Being older than 104. Your risk for this condition increases with age. Diverticulosis is rare in people younger than 40 years. By age 41, almost everyone has it.  Eating a low-fiber diet.  Being frequently constipated.  Being overweight.  Not getting enough exercise.  Smoking.  Taking over-the-counter pain medicines, like aspirin and ibuprofen. SYMPTOMS  Most people with diverticulosis do not have symptoms. DIAGNOSIS  Because diverticulosis often has no symptoms, health care providers often discover the condition during an exam for other colon problems. In many cases, a health care provider will diagnose diverticulosis while using a flexible scope to examine the colon (colonoscopy). TREATMENT  If you have never developed an infection related to diverticulosis, you may not need treatment. If you have had an infection before, treatment may include:  Eating more fruits, vegetables, and grains.  Taking a fiber supplement.  Taking a live bacteria supplement (probiotic).  Taking medicine to relax your colon. HOME CARE INSTRUCTIONS   Drink at least 6-8 glasses of water each day to prevent constipation.  Try not to strain when you have a bowel movement.  Keep all follow-up appointments. If you have had an infection before:  Increase the fiber in your diet as directed by your health care provider or dietitian.  Take a  dietary fiber supplement if your health care provider approves.  Only take medicines as directed by your health care provider. SEEK MEDICAL CARE IF:   You have abdominal pain.  You have bloating.  You have cramps.  You have not gone to the bathroom in 3 days. SEEK IMMEDIATE MEDICAL CARE IF:   Your pain gets worse.  Yourbloating becomes very bad.  You have a fever or chills, and your symptoms suddenly get worse.  You begin vomiting.  You have bowel movements that are bloody or black. MAKE SURE YOU:  Understand these instructions.  Will watch your condition.  Will get help right away if you are not doing well or get worse.   This information is not intended to replace advice given to you by your health care provider. Make sure you discuss any questions you have with your health care provider.   Document Released: 04/26/2004 Document Revised: 08/04/2013 Document Reviewed: 06/24/2013 Elsevier Interactive Patient Education 2016 Else[employer] Inc[employer]  High-Fiber Diet Fiber, also called dietary fiber, is a type of carbohydrate found in fruits, vegetables, whole grains, and beans. A high-fiber diet can have many health benefits. Your health care provider may recommend a high-fiber diet to help:  Prevent constipation. Fiber can make your bowel movements more regular.  Lower your cholesterol.  Relieve hemorrhoids, uncomplicated diverticulosis, or irritable bowel syndrome.  Prevent overeating as part of a weight-loss plan.  Prevent heart disease, type 2 diabetes, and certain cancers. WHAT IS MY PLAN? The recommended daily intake of fiber includes:  38 grams for men under age 8.  41 grams for men over age 41.  49 grams for women under age 12.  5 grams for women over age 59. You can get the recommended daily intake of dietary fiber by eating a variety of fruits, vegetables, grains, and beans. Your health care provider may also recommend a fiber supplement if it is not  possible to get enough fiber through your diet. WHAT DO I NEED TO KNOW ABOUT A HIGH-FIBER DIET?  Fiber supplements have not been widely studied for their effectiveness, so it is better to get fiber through food sources.  Always check the fiber content on thenutrition facts label of any prepackaged food. Look for foods that contain at least 5 grams of fiber per serving.  Ask your dietitian if you have questions about specific foods that are related to your condition, especially if those foods are not listed in the following section.  Increase your daily fiber consumption gradually. Increasing your intake of dietary fiber too quickly may cause bloating, cramping, or gas.  Drink plenty of water. Water helps you to digest fiber. WHAT FOODS CAN I EAT? Grains Whole-grain breads. Multigrain cereal. Oats and oatmeal. Brown rice. Barley. Bulgur wheat. [place]. Bran muffins. Popcorn. Rye wafer crackers. Vegetables Sweet potatoes. Spinach. Kale. Artichokes. Cabbage. Broccoli. Green peas. Carrots. Squash. Fruits Berries. Pears. Apples. Oranges. Avocados. Prunes and raisins. Dried figs. Meats and Other Protein Sources Navy, kidney, pinto, and soy beans. Split peas. Lentils. Nuts and seeds. Dairy Fiber-fortified  yogurt. Beverages Fiber-fortified soy milk. Fiber-fortified orange juice. Other Fiber bars. The items listed above may not be a complete list of recommended foods or beverages. Contact your dietitian for more options. WHAT FOODS ARE NOT RECOMMENDED? Grains White bread. Pasta made with refined flour. White rice. Vegetables Fried potatoes. Canned vegetables. Well-cooked vegetables.  Fruits Fruit juice. Cooked, strained fruit. Meats and Other Protein Sources Fatty cuts of meat. Fried Sales executive or fried fish. Dairy Milk. Yogurt. Cream cheese. Sour cream. Beverages Soft drinks. Other Cakes and pastries. Butter and oils. The items listed above may not be a complete list of foods and  beverages to avoid. Contact your dietitian for more information. WHAT ARE SOME TIPS FOR INCLUDING HIGH-FIBER FOODS IN MY DIET?  Eat a wide variety of high-fiber foods.  Make sure that half of all grains consumed each day are whole grains.  Replace breads and cereals made from refined flour or white flour with whole-grain breads and cereals.  Replace white rice with brown rice, bulgur wheat, or millet.  Start the day with a breakfast that is high in fiber, such as a cereal that contains at least 5 grams of fiber per serving.  Use beans in place of meat in soups, salads, or pasta.  Eat high-fiber snacks, such as berries, raw vegetables, nuts, or popcorn.   This information is not intended to replace advice given to you by your health care provider. Make sure you discuss any questions you have with your health care provider.   Document Released: 07/30/2005 Document Revised: 08/20/2014 Document Reviewed: 01/12/2014 Elsevier Interactive Patient Education 2016 Elsevier Inc.  Colon Polyps Polyps are lumps of extra tissue growing inside the body. Polyps can grow in the large intestine (colon). Most colon polyps are noncancerous (benign). However, some colon polyps can become cancerous over time. Polyps that are larger than a pea may be harmful. To be safe, caregivers remove and test all polyps. CAUSES  Polyps form when mutations in the genes cause your cells to grow and divide even though no more tissue is needed. RISK FACTORS There are a number of risk factors that can increase your chances of getting colon polyps. They include:  Being older than 50 years.  Family history of colon polyps or colon cancer.  Long-term colon diseases, such as colitis or Crohn disease.  Being overweight.  Smoking.  Being inactive.  Drinking too much alcohol. SYMPTOMS  Most small polyps do not cause symptoms. If symptoms are present, they may include:  Blood in the stool. The stool may look dark red  or black.  Constipation or diarrhea that lasts longer than 1 week. DIAGNOSIS People often do not know they have polyps until their caregiver finds them during a regular checkup. Your caregiver can use 4 tests to check for polyps:  Digital rectal exam. The caregiver wears gloves and feels inside the rectum. This test would find polyps only in the rectum.  Barium enema. The caregiver puts a liquid called barium into your rectum before taking X-rays of your colon. Barium makes your colon look white. Polyps are dark, so they are easy to see in the X-ray pictures.  Sigmoidoscopy. A thin, flexible tube (sigmoidoscope) is placed into your rectum. The sigmoidoscope has a light and tiny camera in it. The caregiver uses the sigmoidoscope to look at the last third of your colon.  Colonoscopy. This test is like sigmoidoscopy, but the caregiver looks at the entire colon. This is the most common method for finding and removing  polyps. TREATMENT  Any polyps will be removed during a sigmoidoscopy or colonoscopy. The polyps are then tested for cancer. PREVENTION  To help lower your risk of getting more colon polyps:  Eat plenty of fruits and vegetables. Avoid eating fatty foods.  Do not smoke.  Avoid drinking alcohol.  Exercise every day.  Lose weight if recommended by your caregiver.  Eat plenty of calcium and folate. Foods that are rich in calcium include milk, cheese, and broccoli. Foods that are rich in folate include chickpeas, kidney beans, and spinach. HOME CARE INSTRUCTIONS Keep all follow-up appointments as directed by your caregiver. You may need periodic exams to check for polyps. SEEK MEDICAL CARE IF: You notice bleeding during a bowel movement.   This information is not intended to replace advice given to you by your health care provider. Make sure you discuss any questions you have with your health care provider.   Document Released: 04/25/2004 Document Revised: 08/20/2014 Document  Reviewed: 10/09/2011 Elsevier Interactive Patient Education [employer].

## 2016-02-06 ENCOUNTER — Encounter (HOSPITAL_COMMUNITY): Payer: Self-pay | Admitting: Internal Medicine

## 2016-02-08 ENCOUNTER — Encounter: Payer: Self-pay | Admitting: Family Medicine

## 2016-02-08 ENCOUNTER — Ambulatory Visit (INDEPENDENT_AMBULATORY_CARE_PROVIDER_SITE_OTHER): Payer: PPO | Admitting: Family Medicine

## 2016-02-08 VITALS — BP 128/72 | HR 92 | Resp 16 | Ht 63.0 in | Wt 223.0 lb

## 2016-02-08 DIAGNOSIS — M5440 Lumbago with sciatica, unspecified side: Secondary | ICD-10-CM

## 2016-02-08 DIAGNOSIS — Z1239 Encounter for other screening for malignant neoplasm of breast: Secondary | ICD-10-CM | POA: Diagnosis not present

## 2016-02-08 DIAGNOSIS — F17219 Nicotine dependence, cigarettes, with unspecified nicotine-induced disorders: Secondary | ICD-10-CM

## 2016-02-08 DIAGNOSIS — R7301 Impaired fasting glucose: Secondary | ICD-10-CM

## 2016-02-08 DIAGNOSIS — E559 Vitamin D deficiency, unspecified: Secondary | ICD-10-CM | POA: Diagnosis not present

## 2016-02-08 DIAGNOSIS — F411 Generalized anxiety disorder: Secondary | ICD-10-CM

## 2016-02-08 DIAGNOSIS — I10 Essential (primary) hypertension: Secondary | ICD-10-CM | POA: Diagnosis not present

## 2016-02-08 MED ORDER — TEMAZEPAM 30 MG PO CAPS: 30.0000 mg | ORAL_CAPSULE | Freq: Every evening | ORAL | Status: DC | PRN

## 2016-02-08 NOTE — Assessment & Plan Note (Signed)
Patient educated about the importance of limiting  Carbohydrate intake , the need to commit to daily physical activity for a minimum of 30 minutes , and to commit weight loss. The fact that changes in all these areas will reduce or eliminate all together the development of diabetes is stressed.   Diabetic Labs Latest Ref Rng 10/14/2015 05/29/2014 10/28/2013 03/26/2013 11/08/2012  HbA1c <5.7 % 6.4(H) 5.9(H) 6.1(H) 5.5 -  Chol 125 - 200 mg/dL 134 106 159 - 106  HDL >=46 mg/dL 48 48 69 - 41  Calc LDL <130 mg/dL 56 32 57 - 35  Triglycerides <150 mg/dL 151(H) 128 164(H) - 149  Creatinine 0.50 - 0.99 mg/dL 0.92 0.88 0.92 1.08 1.08   BP/Weight 02/08/2016 02/02/2016 10/14/2015 04/12/2015 12/02/2014 06/28/2014 21/22/4825  Systolic BP 003 704 888 916 945 038 882  Diastolic BP 72 78 70 68 68 64 84  Wt. (Lbs) 223 224 225 224 207 - 218  BMI 39.51 39.69 39.87 39.69 36.68 - 38.63   No flowsheet data found.   Updated lab needed at/ before next visit.

## 2016-02-08 NOTE — Assessment & Plan Note (Signed)
Controlled, no change in medication DASH diet and commitment to daily physical activity for a minimum of 30 minutes discussed and encouraged, as a part of hypertension management. The importance of attaining a healthy weight is also discussed.  BP/Weight 02/08/2016 02/02/2016 10/14/2015 04/12/2015 12/02/2014 06/28/2014 03/88/8280  Systolic BP 034 917 915 056 979 480 165  Diastolic BP 72 78 70 68 68 64 84  Wt. (Lbs) 223 224 225 224 207 - 218  BMI 39.51 39.69 39.87 39.69 36.68 - 38.63

## 2016-02-08 NOTE — Patient Instructions (Addendum)
Annual wellness in 3.5 month, call if you need me before  HBA1C, chem 7 and EGFr, TSH, vit D today  PLEASE schedule mammogram this is past due  New short term for sleep is restoril  NO SMOKING as of 6/30 your son's birthday, use patches  Condolence re recent loss  Please work on good  health habits so that your health will improve. 1. Commitment to daily physical activity for 30 to 60  minutes, if you are able to do this.  2. Commitment to wise food choices. Aim for half of your  food intake to be vegetable and fruit, one quarter starchy foods, and one quarter protein. Try to eat on a regular schedule  3 meals per day, snacking between meals should be limited to vegetables or fruits or small portions of nuts. 64 ounces of water per day is generally recommended, unless you have specific health conditions, like heart failure or kidney failure where you will need to limit fluid intake.  3. Commitment to sufficient and a  good quality of physical and mental rest daily, generally between 6 to 8 hours per day.  WITH PERSISTANCE AND PERSEVERANCE, THE IMPOSSIBLE , BECOMES THE NORM! Thank you  for choosing [facility]. We consider it a privelige to serve you.  Delivering excellent health care in a caring and  compassionate way is our goal.  Partnering with you,  so that together we can achieve this goal is our strategy.

## 2016-02-08 NOTE — Assessment & Plan Note (Signed)

## 2016-02-08 NOTE — Assessment & Plan Note (Signed)
Controlled, no change in medication  

## 2016-02-08 NOTE — Progress Notes (Signed)
[name]     MRN: [id number]      DOB: Jul 23, 1946   HPI [name] is here for follow up and re-evaluation of chronic medical conditions, medication management and review of any available recent lab and radiology data.  Preventive health is updated, specifically  Cancer screening and Immunization.   Questions or concerns regarding consultations or procedures which the PT has had in the interim are  Addressed.Recent colonoscopy positive for adenoma The PT denies any adverse reactions to current medications since the last visit.  Increased stress and anxiety following suicide of her 79 y/o nephew   ROS Denies recent fever or chills. Denies sinus pressure, nasal congestion, ear pain or sore throat. Denies chest congestion, productive cough or wheezing. Denies chest pains, palpitations and leg swelling Denies abdominal pain, nausea, vomiting,diarrhea or constipation.   Denies dysuria, frequency, hesitancy or incontinence. Denies uncontrolled  joint pain, swelling and limitation in mobility. Denies headaches, seizures, numbness, or tingling. Denies depression,has increased  Anxiety and  insomnia. Denies skin break down or rash.   PE  BP 128/72 mmHg  Pulse 92  Resp 16  Ht '5\' 3"'$  (1.6 m)  Wt 223 lb (101.152 kg)  BMI 39.51 kg/m2  SpO2 96%  Patient alert and oriented and in no cardiopulmonary distress.  HEENT: No facial asymmetry, EOMI,   oropharynx pink and moist.  Neck supple no JVD, no mass.  Chest: Clear to auscultation bilaterally.  CVS: S1, S2 no murmurs, no S3.Regular rate.  ABD: Soft non tender.   Ext: No edema  MS: Adequate ROM spine, shoulders, hips and knees.  Skin: Intact, no ulcerations or rash noted.  Psych: Good eye contact, normal affect. Memory intact not anxious or depressed appearing.  CNS: CN 2-12 intact, power,  normal throughout.no focal deficits noted.   Assessment & Plan   Essential hypertension Controlled, no change in medication DASH  diet and commitment to daily physical activity for a minimum of 30 minutes discussed and encouraged, as a part of hypertension management. The importance of attaining a healthy weight is also discussed.  BP/Weight 02/08/2016 02/02/2016 10/14/2015 04/12/2015 12/02/2014 06/28/2014 03/26/4817  Systolic BP 563 149 702 637 858 850 277  Diastolic BP 72 78 70 68 68 64 84  Wt. (Lbs) 223 224 225 224 207 - 218  BMI 39.51 39.69 39.87 39.69 36.68 - 38.63        Nicotine dependence Patient counseled for approximately 5 minutes regarding the health risks of ongoing nicotine use, specifically all types of cancer, heart disease, stroke and respiratory failure. The options available for help with cessation ,the behavioral changes to assist the process, and the option to either gradully reduce usage  Or abruptly stop.is also discussed. Pt is also encouraged to set specific goals in number of cigarettes used daily, as well as to set a quit date.     Anxiety state Increased due to current grief and poor sleep, pt ventilated for approx 5 mins, short addition of restoril  Morbid obesity Deteriorated. Patient re-educated about  the importance of commitment to a  minimum of 150 minutes of exercise per week.  The importance of healthy food choices with portion control discussed. Encouraged to start a food diary, count calories and to consider  joining a support group. Sample diet sheets offered. Goals set by the patient for the next several months.   Weight /BMI 02/08/2016 02/02/2016 10/14/2015  WEIGHT 223 lb 224 lb 225 lb  HEIGHT '5\' 3"'$  '5\' 3"'$   $'5\' 3"'j$   BMI 39.51 kg/m2 39.69 kg/m2 39.87 kg/m2       Back pain with sciatica Controlled, no change in medication   Impaired fasting glucose Patient educated about the importance of limiting  Carbohydrate intake , the need to commit to daily physical activity for a minimum of 30 minutes , and to commit weight loss. The fact that changes in all these areas will  reduce or eliminate all together the development of diabetes is stressed.   Diabetic Labs Latest Ref Rng 10/14/2015 05/29/2014 10/28/2013 03/26/2013 11/08/2012  HbA1c <5.7 % 6.4(H) 5.9(H) 6.1(H) 5.5 -  Chol 125 - 200 mg/dL 134 106 159 - 106  HDL >=46 mg/dL 48 48 69 - 41  Calc LDL <130 mg/dL 56 32 57 - 35  Triglycerides <150 mg/dL 151(H) 128 164(H) - 149  Creatinine 0.50 - 0.99 mg/dL 0.92 0.88 0.92 1.08 1.08   BP/Weight 02/08/2016 02/02/2016 10/14/2015 04/12/2015 12/02/2014 06/28/2014 25/75/0518  Systolic BP 335 825 189 842 103 128 118  Diastolic BP 72 78 70 68 68 64 84  Wt. (Lbs) 223 224 225 224 207 - 218  BMI 39.51 39.69 39.87 39.69 36.68 - 38.63   No flowsheet data found.   Updated lab needed at/ before next visit.

## 2016-02-08 NOTE — Assessment & Plan Note (Signed)
Deteriorated. Patient re-educated about  the importance of commitment to a  minimum of 150 minutes of exercise per week.  The importance of healthy food choices with portion control discussed. Encouraged to start a food diary, count calories and to consider  joining a support group. Sample diet sheets offered. Goals set by the patient for the next several months.   Weight /BMI 02/08/2016 02/02/2016 10/14/2015  WEIGHT 223 lb 224 lb 225 lb  HEIGHT '5\' 3"'$  '5\' 3"'$  '5\' 3"'$   BMI 39.51 kg/m2 39.69 kg/m2 39.87 kg/m2

## 2016-02-08 NOTE — Assessment & Plan Note (Signed)
Increased due to current grief and poor sleep, pt ventilated for approx 5 mins, short addition of restoril

## 2016-02-09 LAB — BASIC METABOLIC PANEL WITH GFR
BUN: 16 mg/dL (ref 7–25)
CHLORIDE: 100 mmol/L (ref 98–110)
CO2: 28 mmol/L (ref 20–31)
Calcium: 9.1 mg/dL (ref 8.6–10.4)
Creat: 0.95 mg/dL (ref 0.50–0.99)
GFR, EST AFRICAN AMERICAN: 72 mL/min (ref >=60)
GFR, EST NON AFRICAN AMERICAN: 62 mL/min (ref >=60)
Glucose, Bld: 75 mg/dL (ref 65–99)
POTASSIUM: 3.7 mmol/L (ref 3.5–5.3)
Sodium: 137 mmol/L (ref 135–146)

## 2016-02-09 LAB — HEMOGLOBIN A1C
HEMOGLOBIN A1C: 5.8 % — AB (ref <5.7)
MEAN PLASMA GLUCOSE: 120 mg/dL

## 2016-02-09 LAB — VITAMIN D 25 HYDROXY (VIT D DEFICIENCY, FRACTURES): VIT D 25 HYDROXY: 57 ng/mL (ref 30–100)

## 2016-02-09 LAB — TSH: TSH: 0.93 mIU/L

## 2016-02-23 ENCOUNTER — Other Ambulatory Visit: Payer: Self-pay

## 2016-02-23 MED ORDER — HYDROCODONE-ACETAMINOPHEN 5-325 MG PO TABS: ORAL_TABLET | ORAL | Status: DC

## 2016-03-05 ENCOUNTER — Other Ambulatory Visit: Payer: Self-pay

## 2016-03-05 MED ORDER — TIZANIDINE HCL 4 MG PO TABS
4.0000 mg | ORAL_TABLET | Freq: Three times a day (TID) | ORAL | 1 refills | Status: DC
Start: 2016-03-05 — End: 2016-08-22

## 2016-03-29 ENCOUNTER — Other Ambulatory Visit: Payer: Self-pay

## 2016-03-29 ENCOUNTER — Telehealth: Payer: Self-pay | Admitting: Family Medicine

## 2016-03-29 ENCOUNTER — Other Ambulatory Visit: Payer: Self-pay | Admitting: Family Medicine

## 2016-03-29 MED ORDER — ROSUVASTATIN CALCIUM 40 MG PO TABS
40.0000 mg | ORAL_TABLET | Freq: Every day | ORAL | 4 refills | Status: DC
Start: 2016-03-29 — End: 2016-06-26

## 2016-03-29 MED ORDER — CELECOXIB 400 MG PO CAPS: 400.0000 mg | ORAL_CAPSULE | Freq: Every day | ORAL | 4 refills | Status: DC

## 2016-03-29 MED ORDER — FUROSEMIDE 20 MG PO TABS: 20.0000 mg | ORAL_TABLET | Freq: Two times a day (BID) | ORAL | 4 refills | Status: DC

## 2016-03-29 MED ORDER — ALPRAZOLAM 1 MG PO TABS
1.0000 mg | ORAL_TABLET | Freq: Two times a day (BID) | ORAL | 3 refills | Status: DC
Start: 2016-03-29 — End: 2016-07-24

## 2016-03-29 MED ORDER — VITAMIN D (ERGOCALCIFEROL) 1.25 MG (50000 UNIT) PO CAPS: 50000.0000 [IU] | ORAL_CAPSULE | ORAL | 5 refills | Status: DC

## 2016-03-29 NOTE — Telephone Encounter (Signed)
[name] is needing her Rx's refilled now, she is going out of town Sunday and needs these before ALPRAZolam (XANAX) 1 MG tablet, rosuvastatin (CRESTOR) 40 MG tablet , furosemide (LASIX) 20 MG tablet, Vitamin D, Ergocalciferol, (DRISDOL) 50000 units CAPS capsule, celecoxib (CELEBREX) 400 MG capsule

## 2016-03-29 NOTE — Telephone Encounter (Signed)
meds refilled 

## 2016-03-30 ENCOUNTER — Other Ambulatory Visit: Payer: Self-pay

## 2016-03-30 MED ORDER — HYDROCODONE-ACETAMINOPHEN 5-325 MG PO TABS: ORAL_TABLET | ORAL | 0 refills | Status: DC

## 2016-04-17 ENCOUNTER — Telehealth: Payer: Self-pay | Admitting: Family Medicine

## 2016-04-17 ENCOUNTER — Ambulatory Visit (INDEPENDENT_AMBULATORY_CARE_PROVIDER_SITE_OTHER): Payer: PPO | Admitting: Family Medicine

## 2016-04-17 ENCOUNTER — Encounter: Payer: Self-pay | Admitting: Family Medicine

## 2016-04-17 VITALS — BP 132/60 | HR 100 | Resp 18 | Ht 63.0 in | Wt 225.0 lb

## 2016-04-17 DIAGNOSIS — M5441 Lumbago with sciatica, right side: Secondary | ICD-10-CM | POA: Diagnosis not present

## 2016-04-17 MED ORDER — METHYLPREDNISOLONE ACETATE 80 MG/ML IJ SUSP
80.0000 mg | Freq: Once | INTRAMUSCULAR | Status: AC
  Administered 2016-04-17: 80 mg via INTRAMUSCULAR

## 2016-04-17 MED ORDER — KETOROLAC TROMETHAMINE 60 MG/2ML IM SOLN
60.0000 mg | Freq: Once | INTRAMUSCULAR | Status: AC
  Administered 2016-04-17: 60 mg via INTRAMUSCULAR

## 2016-04-17 NOTE — Patient Instructions (Addendum)
Rest Get off of your feet, activity as tolerated  Take the hydrocodone and tizanidine on schedule  I have given you an injection for pain  I have given you an injection of prednisone This is an anti inflammatory to reduce the nerve irritation  Call if you are not seeing improvement in a day or two  You may try ice or heat to your low back to help with the discomfort

## 2016-04-17 NOTE — Progress Notes (Signed)
Chief Complaint  Patient presents with  . Back Pain   Patient of Dr Tula Nakayama.  I am seeing this afternoon for an acute visit. Longstanding low back pain from degenerative disc disease.  Usually managed by daily celebrex, hydrocodone and tizanidine.  Is on a narcotic pain contract and is compliant with care.   She has not had an acute flare of pain in about 2 years. She attended a conference needed for her job and had a long car drive followed by up to 5 hours of classes.  This was hard on her as she usually stretches and changes position frequently.  No overuse or bending/lifting.  No fall or injury.  She has severe pain in her central low back radiating down the back on her Right leg to the foot.  No numbness or weakness.  No fever or chills or illness.  No bowel or bladder incontinence. She has not used heat or ice.  She describes a special pillow she uses for her back. When it last flared up she was treated with a DepoMedrol injection to reduce inflammation and a pain shot of Toradol.  This was effective.  No side effects.  Patient is not a diabetic.   Patient Active Problem List   Diagnosis Date Noted  . COPD (chronic obstructive pulmonary disease) ([place]) 10/16/2015  . Nicotine dependence 12/05/2014  . Depression 12/31/2013  . Female bladder prolapse 03/10/2013  . Impaired fasting glucose 10/03/2010  . Vitamin D deficiency 09/08/2009  . Back pain with sciatica 04/04/2009  . Essential hypertension 06/09/2008  . ABDOMINAL WALL HERNIA 06/09/2008  . Hyperlipidemia LDL goal <100 11/14/2007  . Morbid obesity ([place]) 11/14/2007  . Anxiety state 11/14/2007  . GERD 11/14/2007    Outpatient Encounter Prescriptions as of 04/17/2016  Medication Sig  . albuterol (PROVENTIL) (2.5 MG/3ML) 0.083% nebulizer solution INHALE ONE VIAL VIA NEBULIZER EVERY 6 HOURS AS NEEDED. (Patient taking differently: INHALE ONE VIAL VIA NEBULIZER EVERY 6 HOURS AS NEEDED SHORTNESS OF BREATH.)  . ALPRAZolam  (XANAX) 1 MG tablet Take 1 tablet (1 mg total) by mouth 2 (two) times daily.  Marland Kitchen aspirin (ASPIRIN LOW DOSE) 81 MG EC tablet Take 81 mg by mouth daily.    . celecoxib (CELEBREX) 400 MG capsule Take 1 capsule (400 mg total) by mouth daily.  Marland Kitchen diltiazem (CARDIZEM CD) 120 MG 24 hr capsule Take 1 capsule (120 mg total) by mouth daily.  Marland Kitchen EPINEPHrine (EPIPEN 2-PAK) 0.3 mg/0.3 mL IJ SOAJ injection INJECT INTO THE MUSCLE AS DIRECTED.  Marland Kitchen FLUoxetine (PROZAC) 20 MG capsule take 1 tablet once daily  . furosemide (LASIX) 20 MG tablet Take 1 tablet (20 mg total) by mouth 2 (two) times daily.  Marland Kitchen HYDROcodone-acetaminophen (NORCO/VICODIN) 5-325 MG tablet TAKE ONE TABLET THREE TIMES A DAY  . ipratropium-albuterol (DUONEB) 0.5-2.5 (3) MG/3ML SOLN Take 3 mLs by nebulization every 6 (six) hours as needed.  Marland Kitchen lisinopril (PRINIVIL,ZESTRIL) 10 MG tablet Take 1 tablet (10 mg total) by mouth daily.  Marland Kitchen omeprazole (PRILOSEC) 40 MG capsule Take 1 capsule (40 mg total) by mouth daily.  . potassium chloride SA (K-DUR,KLOR-CON) 20 MEQ tablet Take 1 tablet (20 mEq total) by mouth daily.  Marland Kitchen PROAIR HFA 108 (90 BASE) MCG/ACT inhaler UWSE 2 PUFFS EVERY 6 HOURS AS NEEDED FOR SHORTNESS OF BREATH.  . rosuvastatin (CRESTOR) 40 MG tablet Take 1 tablet (40 mg total) by mouth at bedtime.  Marland Kitchen tiZANidine (ZANAFLEX) 4 MG tablet Take 1 tablet (4 mg total)  by mouth 3 (three) times daily.  . Vitamin D, Ergocalciferol, (DRISDOL) 50000 units CAPS capsule Take 1 capsule (50,000 Units total) by mouth every 7 (seven) days.  . temazepam (RESTORIL) 30 MG capsule Take 1 capsule (30 mg total) by mouth at bedtime as needed for sleep.  .     Facility-Administered Encounter Medications as of 04/17/2016  Medication  . ketorolac (TORADOL) injection 60 mg  . methylPREDNISolone acetate (DEPO-MEDROL) injection 80 mg    Allergies  Allergen Reactions  . Codeine   . Penicillins Other (See Comments)    Blisters in mouth after dental work Has patient had a PCN  reaction causing immediate rash, facial/tongue/throat swelling, SOB or lightheadedness with hypotension: Yes Has patient had a PCN reaction causing severe rash involving mucus membranes or skin necrosis: No Has patient had a PCN reaction that required hospitalization No Has patient had a PCN reaction occurring within the last 10 years: Yes If all of the above answers are "NO", then may proceed with Cephalosporin use.     Review of Systems  Constitutional: Positive for activity change. Negative for chills, fever and unexpected weight change.  HENT: Negative for congestion, dental problem and trouble swallowing.   Eyes: Negative for photophobia and visual disturbance.  Respiratory: Negative for cough, chest tightness and shortness of breath.   Cardiovascular: Positive for leg swelling. Negative for chest pain and palpitations.       Had edema that she treated with lasix  Gastrointestinal: Positive for constipation. Negative for abdominal pain, blood in stool and diarrhea.       Managed, from chronic narcotics  Genitourinary: Negative for difficulty urinating, dysuria and flank pain.  Musculoskeletal: Positive for back pain. Negative for neck pain.  Skin: Negative.   Neurological: Negative for dizziness and headaches.  Psychiatric/Behavioral: Positive for sleep disturbance. Negative for dysphoric mood. The patient is not nervous/anxious.        From pain    BP 132/60   Pulse 100   Resp 18   Ht '5\' 3"'$  (1.6 m)   Wt 225 lb (102.1 kg)   SpO2 94%   BMI 39.86 kg/m   Physical Exam  Constitutional: She is oriented to person, place, and time. She appears well-developed and well-nourished. No distress.  Appears moderately uncomfortable  HENT:  Head: Normocephalic and atraumatic.  Mouth/Throat: Oropharynx is clear and moist.  Eyes: Conjunctivae are normal. Pupils are equal, round, and reactive to light.  Neck: Normal range of motion. No thyromegaly present.  Cardiovascular: Normal rate,  regular rhythm and normal heart sounds.   Pulmonary/Chest: Effort normal and breath sounds normal. No respiratory distress.  Abdominal: Soft. Bowel sounds are normal.  Musculoskeletal:  Lumbar spine is straight and symmetric. Limited range of motion. Moderate tenderness to palpation of central L5S1 area.  Muscles are tight and mildly tender.. Strength, sensation, range of motion, are normal in both lower extremities. Straight leg raise is negative on the LEFT and positive on the RIGHT   Lymphadenopathy:    She has no cervical adenopathy.  Neurological: She is alert and oriented to person, place, and time.  Absent R ankle reflex  Psychiatric: She has a normal mood and affect. Her behavior is normal. Thought content normal.    1. Acute back pain with sciatica, right Flare of chronic condition. - methylPREDNISolone acetate (DEPO-MEDROL) injection 80 mg; Inject 1 mL (80 mg total) into the muscle once. - ketorolac (TORADOL) injection 60 mg; Inject 2 mLs (60 mg total) into the muscle  once.  Discussed in addition smoking cessation.  Patient Instructions  Rest Get off of your feet, activity as tolerated  Take the hydrocodone and tizanidine on schedule  I have given you an injection for pain  I have given you an injection of prednisone This is an anti inflammatory to reduce the nerve irritation  Call if you are not seeing improvement in a day or two  You may try ice or heat to your low back to help with the discomfort   Raylene Everts, MD

## 2016-04-17 NOTE — Telephone Encounter (Signed)
Patient will be seen this afternoon by Dr. Meda Coffee at 2pm

## 2016-04-17 NOTE — Telephone Encounter (Signed)
[place] is asking to be seen by Dr. Moshe Cipro this week she is c/o back pain, please advise?

## 2016-04-18 ENCOUNTER — Other Ambulatory Visit: Payer: Self-pay

## 2016-04-18 MED ORDER — HYDROCODONE-ACETAMINOPHEN 5-325 MG PO TABS: ORAL_TABLET | ORAL | 0 refills | Status: DC

## 2016-04-28 ENCOUNTER — Other Ambulatory Visit: Payer: Self-pay | Admitting: Family Medicine

## 2016-05-25 ENCOUNTER — Other Ambulatory Visit: Payer: Self-pay

## 2016-05-25 MED ORDER — HYDROCODONE-ACETAMINOPHEN 5-325 MG PO TABS: ORAL_TABLET | ORAL | 0 refills | Status: DC

## 2016-05-27 ENCOUNTER — Other Ambulatory Visit: Payer: Self-pay | Admitting: Family Medicine

## 2016-06-06 ENCOUNTER — Other Ambulatory Visit: Payer: Self-pay | Admitting: Family Medicine

## 2016-06-26 ENCOUNTER — Other Ambulatory Visit: Payer: Self-pay | Admitting: Family Medicine

## 2016-06-26 ENCOUNTER — Other Ambulatory Visit: Payer: Self-pay

## 2016-06-26 MED ORDER — POTASSIUM CHLORIDE CRYS ER 20 MEQ PO TBCR: 20.0000 meq | EXTENDED_RELEASE_TABLET | Freq: Every day | ORAL | 1 refills | Status: DC

## 2016-06-26 MED ORDER — EPINEPHRINE 0.3 MG/0.3ML IJ SOAJ: 0.3000 mg | Freq: Once | INTRAMUSCULAR | 0 refills | Status: AC

## 2016-06-26 MED ORDER — ROSUVASTATIN CALCIUM 40 MG PO TABS: 40.0000 mg | ORAL_TABLET | Freq: Every day | ORAL | 1 refills | Status: DC

## 2016-06-26 MED ORDER — CELECOXIB 400 MG PO CAPS: 400.0000 mg | ORAL_CAPSULE | Freq: Every day | ORAL | 1 refills | Status: DC

## 2016-06-26 MED ORDER — LISINOPRIL 10 MG PO TABS: 10.0000 mg | ORAL_TABLET | Freq: Every day | ORAL | 1 refills | Status: DC

## 2016-06-26 MED ORDER — FLUOXETINE HCL 20 MG PO CAPS: 20.0000 mg | ORAL_CAPSULE | Freq: Every day | ORAL | 1 refills | Status: DC

## 2016-06-26 MED ORDER — DILTIAZEM HCL ER COATED BEADS 120 MG PO CP24: 120.0000 mg | ORAL_CAPSULE | Freq: Every day | ORAL | 1 refills | Status: DC

## 2016-06-26 MED ORDER — FUROSEMIDE 20 MG PO TABS: 20.0000 mg | ORAL_TABLET | Freq: Two times a day (BID) | ORAL | 1 refills | Status: DC

## 2016-06-29 ENCOUNTER — Other Ambulatory Visit: Payer: Self-pay

## 2016-06-29 MED ORDER — HYDROCODONE-ACETAMINOPHEN 5-325 MG PO TABS: ORAL_TABLET | ORAL | 0 refills | Status: DC

## 2016-07-10 ENCOUNTER — Ambulatory Visit (INDEPENDENT_AMBULATORY_CARE_PROVIDER_SITE_OTHER): Payer: PPO

## 2016-07-10 VITALS — BP 126/74 | HR 110 | Resp 18 | Ht 63.0 in | Wt 223.0 lb

## 2016-07-10 DIAGNOSIS — Z0000 Encounter for general adult medical examination without abnormal findings: Secondary | ICD-10-CM | POA: Diagnosis not present

## 2016-07-10 DIAGNOSIS — Z23 Encounter for immunization: Secondary | ICD-10-CM

## 2016-07-10 DIAGNOSIS — Z1231 Encounter for screening mammogram for malignant neoplasm of breast: Secondary | ICD-10-CM

## 2016-07-10 DIAGNOSIS — Z1239 Encounter for other screening for malignant neoplasm of breast: Secondary | ICD-10-CM

## 2016-07-10 NOTE — Patient Instructions (Signed)
Thank you for choosing [facility] for your health care needs  The Annual Wellness Visit is designed to allow [place] the chance to assist you in preserving and improving you health.   Dr. Moshe Cipro will see you back in March  If any labs are needed they will be mailed to you with the approximate date to have them drawn  Please consider the bone density scan  I will research your meds and see if there are any saving cards

## 2016-07-13 NOTE — Progress Notes (Signed)
Subjective:    [name] is a 70 y.o. female who presents for Medicare Annual/Subsequent preventive examination.  Preventive Screening-Counseling & Management  Tobacco History  Smoking Status  . Current Every Day Smoker  . Packs/day: 0.50  . Types: Cigarettes  Smokeless Tobacco  . Never Used    Comment: doing 3 per day , down to 2 per day      Current Problems (verified) Patient Active Problem List   Diagnosis Date Noted  . COPD (chronic obstructive pulmonary disease) ([place]) 10/16/2015  . Nicotine dependence 12/05/2014  . Depression 12/31/2013  . Female bladder prolapse 03/10/2013  . Impaired fasting glucose 10/03/2010  . Vitamin D deficiency 09/08/2009  . Back pain with sciatica 04/04/2009  . Essential hypertension 06/09/2008  . ABDOMINAL WALL HERNIA 06/09/2008  . Hyperlipidemia LDL goal <100 11/14/2007  . Morbid obesity ([place]) 11/14/2007  . Anxiety state 11/14/2007  . GERD 11/14/2007    Medications Prior to Visit Current Outpatient Prescriptions on File Prior to Visit  Medication Sig Dispense Refill  . albuterol (PROVENTIL) (2.5 MG/3ML) 0.083% nebulizer solution INHALE ONE VIAL VIA NEBULIZER EVERY 6 HOURS AS NEEDED. 375 mL 0  . ALPRAZolam (XANAX) 1 MG tablet Take 1 tablet (1 mg total) by mouth 2 (two) times daily. 60 tablet 3  . aspirin (ASPIRIN LOW DOSE) 81 MG EC tablet Take 81 mg by mouth daily.      . celecoxib (CELEBREX) 400 MG capsule Take 1 capsule (400 mg total) by mouth daily. 30 capsule 1  . diltiazem (CARDIZEM CD) 120 MG 24 hr capsule Take 1 capsule (120 mg total) by mouth daily. 90 capsule 1  . FLUoxetine (PROZAC) 20 MG capsule Take 1 capsule (20 mg total) by mouth daily. 90 capsule 1  . furosemide (LASIX) 20 MG tablet Take 1 tablet (20 mg total) by mouth 2 (two) times daily. 180 tablet 1  . HYDROcodone-acetaminophen (NORCO/VICODIN) 5-325 MG tablet TAKE ONE TABLET THREE TIMES A DAY 90 tablet 0  . ipratropium-albuterol (DUONEB) 0.5-2.5 (3) MG/3ML SOLN  Take 3 mLs by nebulization every 6 (six) hours as needed. 360 mL 0  . lisinopril (PRINIVIL,ZESTRIL) 10 MG tablet Take 1 tablet (10 mg total) by mouth daily. 90 tablet 1  . omeprazole (PRILOSEC) 40 MG capsule take 1 capsule by mouth once daily 90 capsule 1  . potassium chloride SA (K-DUR,KLOR-CON) 20 MEQ tablet Take 1 tablet (20 mEq total) by mouth daily. 90 tablet 1  . PROAIR HFA 108 (90 BASE) MCG/ACT inhaler UWSE 2 PUFFS EVERY 6 HOURS AS NEEDED FOR SHORTNESS OF BREATH. 8.5 g 1  . rosuvastatin (CRESTOR) 40 MG tablet Take 1 tablet (40 mg total) by mouth at bedtime. 90 tablet 1  . temazepam (RESTORIL) 30 MG capsule Take 1 capsule (30 mg total) by mouth at bedtime as needed for sleep. 30 capsule 0  . tiZANidine (ZANAFLEX) 4 MG tablet Take 1 tablet (4 mg total) by mouth 3 (three) times daily. 270 tablet 1  . Vitamin D, Ergocalciferol, (DRISDOL) 50000 units CAPS capsule Take 1 capsule (50,000 Units total) by mouth every 7 (seven) days. 4 capsule 5   No current facility-administered medications on file prior to visit.     Current Medications (verified) Current Outpatient Prescriptions  Medication Sig Dispense Refill  . albuterol (PROVENTIL) (2.5 MG/3ML) 0.083% nebulizer solution INHALE ONE VIAL VIA NEBULIZER EVERY 6 HOURS AS NEEDED. 375 mL 0  . ALPRAZolam (XANAX) 1 MG tablet Take 1 tablet (1 mg total) by mouth 2 (  two) times daily. 60 tablet 3  . aspirin (ASPIRIN LOW DOSE) 81 MG EC tablet Take 81 mg by mouth daily.      . celecoxib (CELEBREX) 400 MG capsule Take 1 capsule (400 mg total) by mouth daily. 30 capsule 1  . diltiazem (CARDIZEM CD) 120 MG 24 hr capsule Take 1 capsule (120 mg total) by mouth daily. 90 capsule 1  . FLUoxetine (PROZAC) 20 MG capsule Take 1 capsule (20 mg total) by mouth daily. 90 capsule 1  . furosemide (LASIX) 20 MG tablet Take 1 tablet (20 mg total) by mouth 2 (two) times daily. 180 tablet 1  . HYDROcodone-acetaminophen (NORCO/VICODIN) 5-325 MG tablet TAKE ONE TABLET THREE  TIMES A DAY 90 tablet 0  . ipratropium-albuterol (DUONEB) 0.5-2.5 (3) MG/3ML SOLN Take 3 mLs by nebulization every 6 (six) hours as needed. 360 mL 0  . lisinopril (PRINIVIL,ZESTRIL) 10 MG tablet Take 1 tablet (10 mg total) by mouth daily. 90 tablet 1  . omeprazole (PRILOSEC) 40 MG capsule take 1 capsule by mouth once daily 90 capsule 1  . potassium chloride SA (K-DUR,KLOR-CON) 20 MEQ tablet Take 1 tablet (20 mEq total) by mouth daily. 90 tablet 1  . PROAIR HFA 108 (90 BASE) MCG/ACT inhaler UWSE 2 PUFFS EVERY 6 HOURS AS NEEDED FOR SHORTNESS OF BREATH. 8.5 g 1  . rosuvastatin (CRESTOR) 40 MG tablet Take 1 tablet (40 mg total) by mouth at bedtime. 90 tablet 1  . temazepam (RESTORIL) 30 MG capsule Take 1 capsule (30 mg total) by mouth at bedtime as needed for sleep. 30 capsule 0  . tiZANidine (ZANAFLEX) 4 MG tablet Take 1 tablet (4 mg total) by mouth 3 (three) times daily. 270 tablet 1  . Vitamin D, Ergocalciferol, (DRISDOL) 50000 units CAPS capsule Take 1 capsule (50,000 Units total) by mouth every 7 (seven) days. 4 capsule 5   No current facility-administered medications for this visit.      Allergies (verified) Codeine and Penicillins   PAST HISTORY  Family History Family History  Problem Relation Age of Onset  . Heart disease Mother     enlarged  . Diabetes Mother   . Hypertension Mother   . Cancer Father     throat  . Hypertension Father   . Coronary artery disease Father   . Diabetes Sister     x3  . Asthma Sister   . Kidney disease Brother   . Stroke Brother     Social History Social History  Substance Use Topics  . Smoking status: Current Every Day Smoker    Packs/day: 0.50    Types: Cigarettes  . Smokeless tobacco: Never Used     Comment: doing 3 per day , down to 2 per day  . Alcohol use No     Are there smokers in your home (other than you)? Yes  Risk Factors Current exercise habits: The patient does not participate in regular exercise at present.  Dietary  issues discussed: changes with food labels   Cardiac risk factors: advanced age (older than 6 for men, 32 for women), dyslipidemia, hypertension and obesity (BMI >= 30 kg/m2).  Depression Screen (Note: if answer to either of the following is "Yes", a more complete depression screening is indicated)   Over the past two weeks, have you felt down, depressed or hopeless? No  Over the past two weeks, have you felt little interest or pleasure in doing things? No  Have you lost interest or pleasure in daily life? No  Do you often feel hopeless? No  Do you cry easily over simple problems? No  Activities of Daily Living In your present state of health, do you have any difficulty performing the following activities?:  Driving? No Managing money?  No Feeding yourself? No Getting from bed to chair? No Climbing a flight of stairs? No Preparing food and eating?: No Bathing or showering? No Getting dressed: No Getting to the toilet? No Using the toilet:No Moving around from place to place: No In the past year have you fallen or had a near fall?:No   Are you sexually active?  Yes  Do you have more than one partner?  No  Hearing Difficulties: No Do you often ask people to speak up or repeat themselves? No Do you experience ringing or noises in your ears? No Do you have difficulty understanding soft or whispered voices? No   Do you feel that you have a problem with memory? No  Do you often misplace items? No  Do you feel safe at home?  Yes  Cognitive Testing  Alert? Yes  Normal Appearance?Yes  Oriented to person? Yes  Place? Yes   Time? Yes  Recall of three objects?  Yes  Can perform simple calculations? Yes  Displays appropriate judgment?Yes  Can read the correct time from a watch face?Yes   Advanced Directives have been discussed with the patient? Yes  List the Names of Other Physician/Practitioners you currently use: 1.    Indicate any recent Medical Services you may have  received from other than [facility] providers in the past year (date may be approximate).  Immunization History  Administered Date(s) Administered  . Influenza Split 07/17/2011, 04/28/2012  . Influenza Whole 05/24/2010  . Influenza,inj,Quad PF,36+ Mos 04/22/2013, 05/31/2014, 04/12/2015, 07/10/2016  . Pneumococcal Conjugate-13 03/01/2014  . Pneumococcal Polysaccharide-23 10/14/2015  . Td 05/24/2010  . Zoster 10/03/2010    Screening Tests Health Maintenance  Topic Date Due  . MAMMOGRAM  11/10/2015  . DEXA SCAN  01/05/2018 (Originally 06/02/2013)  . TETANUS/TDAP  05/24/2020  . COLONOSCOPY  02/01/2021  . INFLUENZA VACCINE  Completed  . ZOSTAVAX  Completed  . Hepatitis C Screening  Completed  . PNA vac Low Risk Adult  Completed    All answers were reviewed with the patient and necessary referrals were made:  Vanetta Mulders, LPN   [id number]   History reviewed: allergies, current medications, past family history, past medical history, past social history, past surgical history and problem list  Review of Systems A comprehensive review of systems was negative.    Objective:     Vision by Snellen chart: right eye:20/20, left eye:20/20  Body mass index is 39.5 kg/m. BP 126/74   Pulse (!) 110   Resp 18   Ht '5\' 3"'$  (1.6 m)   Wt 223 lb (101.2 kg)   SpO2 90%   BMI 39.50 kg/m   No exam performed today, annual wellness without physical exam.     Assessment:        Plan:     During the course of the visit the patient was educated and counseled about appropriate screening and preventive services including:    Influenza vaccine  Screening mammography  Bone densitometry screening  Diet review for nutrition referral? Yes ____  Not Indicated ___x_   Patient Instructions (the written plan) was given to the patient.  Medicare Attestation I have personally reviewed: The patient's medical and social history Their use of alcohol, tobacco or illicit drugs Their  current medications and supplements The patient's functional ability including ADLs,fall risks, home safety risks, cognitive, and hearing and visual impairment Diet and physical activities Evidence for depression or mood disorders  The patient's weight, height, BMI, and visual acuity have been recorded in the chart.  I have made referrals, counseling, and provided education to the patient based on review of the above and I have provided the patient with a written personalized care plan for preventive services.     Denman George [place], [place]   [id number]

## 2016-07-24 ENCOUNTER — Telehealth: Payer: Self-pay | Admitting: Family Medicine

## 2016-07-24 ENCOUNTER — Other Ambulatory Visit: Payer: Self-pay | Admitting: Family Medicine

## 2016-07-24 DIAGNOSIS — E559 Vitamin D deficiency, unspecified: Secondary | ICD-10-CM

## 2016-07-24 DIAGNOSIS — E785 Hyperlipidemia, unspecified: Secondary | ICD-10-CM

## 2016-07-24 DIAGNOSIS — D539 Nutritional anemia, unspecified: Secondary | ICD-10-CM

## 2016-07-24 DIAGNOSIS — R7301 Impaired fasting glucose: Secondary | ICD-10-CM

## 2016-07-24 DIAGNOSIS — I10 Essential (primary) hypertension: Secondary | ICD-10-CM

## 2016-07-24 NOTE — Telephone Encounter (Signed)
Needs refill on xanax °

## 2016-07-25 NOTE — Telephone Encounter (Signed)
Refill sent.

## 2016-07-26 ENCOUNTER — Encounter: Payer: Self-pay | Admitting: Family Medicine

## 2016-07-26 DIAGNOSIS — Z0000 Encounter for general adult medical examination without abnormal findings: Secondary | ICD-10-CM | POA: Insufficient documentation

## 2016-07-26 NOTE — Assessment & Plan Note (Signed)

## 2016-07-27 ENCOUNTER — Other Ambulatory Visit: Payer: Self-pay | Admitting: Family Medicine

## 2016-07-27 DIAGNOSIS — D539 Nutritional anemia, unspecified: Secondary | ICD-10-CM | POA: Insufficient documentation

## 2016-07-27 DIAGNOSIS — J449 Chronic obstructive pulmonary disease, unspecified: Secondary | ICD-10-CM | POA: Diagnosis not present

## 2016-07-27 MED ORDER — IPRATROPIUM-ALBUTEROL 0.5-2.5 (3) MG/3ML IN SOLN: 3.0000 mL | Freq: Four times a day (QID) | RESPIRATORY_TRACT | 0 refills | Status: DC | PRN

## 2016-07-31 ENCOUNTER — Other Ambulatory Visit: Payer: Self-pay

## 2016-07-31 MED ORDER — HYDROCODONE-ACETAMINOPHEN 5-325 MG PO TABS: ORAL_TABLET | ORAL | 0 refills | Status: DC

## 2016-08-20 DIAGNOSIS — D539 Nutritional anemia, unspecified: Secondary | ICD-10-CM | POA: Diagnosis not present

## 2016-08-20 DIAGNOSIS — I10 Essential (primary) hypertension: Secondary | ICD-10-CM | POA: Diagnosis not present

## 2016-08-20 DIAGNOSIS — R7301 Impaired fasting glucose: Secondary | ICD-10-CM | POA: Diagnosis not present

## 2016-08-20 DIAGNOSIS — E785 Hyperlipidemia, unspecified: Secondary | ICD-10-CM | POA: Diagnosis not present

## 2016-08-20 DIAGNOSIS — E559 Vitamin D deficiency, unspecified: Secondary | ICD-10-CM | POA: Diagnosis not present

## 2016-08-20 LAB — CBC
HCT: 39.8 % (ref 35.0–45.0)
Hemoglobin: 12.3 g/dL (ref 11.7–15.5)
MCH: 28.1 pg (ref 27.0–33.0)
MCHC: 30.9 g/dL — AB (ref 32.0–36.0)
MCV: 90.9 fL (ref 80.0–100.0)
MPV: 10.1 fL (ref 7.5–12.5)
PLATELETS: 248 10*3/uL (ref 140–400)
RBC: 4.38 MIL/uL (ref 3.80–5.10)
RDW: 13.8 % (ref 11.0–15.0)
WBC: 5.3 10*3/uL (ref 3.8–10.8)

## 2016-08-20 LAB — HEMOGLOBIN A1C
Hgb A1c MFr Bld: 5.4 % (ref <5.7)
MEAN PLASMA GLUCOSE: 108 mg/dL

## 2016-08-21 LAB — COMPLETE METABOLIC PANEL WITH GFR
ALT: 8 U/L (ref 6–29)
AST: 15 U/L (ref 10–35)
Albumin: 4 g/dL (ref 3.6–5.1)
Alkaline Phosphatase: 62 U/L (ref 33–130)
BUN: 13 mg/dL (ref 7–25)
CHLORIDE: 103 mmol/L (ref 98–110)
CO2: 27 mmol/L (ref 20–31)
CREATININE: 1.09 mg/dL — AB (ref 0.50–0.99)
Calcium: 9.1 mg/dL (ref 8.6–10.4)
GFR, EST AFRICAN AMERICAN: 60 mL/min (ref >=60)
GFR, Est Non African American: 52 mL/min — ABNORMAL LOW (ref >=60)
Glucose, Bld: 136 mg/dL — ABNORMAL HIGH (ref 65–99)
POTASSIUM: 4.7 mmol/L (ref 3.5–5.3)
Sodium: 138 mmol/L (ref 135–146)
Total Bilirubin: 0.4 mg/dL (ref 0.2–1.2)
Total Protein: 6.3 g/dL (ref 6.1–8.1)

## 2016-08-21 LAB — LIPID PANEL
Cholesterol: 100 mg/dL (ref <200)
HDL: 51 mg/dL (ref >50)
LDL CALC: 28 mg/dL (ref <100)
TRIGLYCERIDES: 106 mg/dL (ref <150)
Total CHOL/HDL Ratio: 2 Ratio (ref <5.0)
VLDL: 21 mg/dL (ref <30)

## 2016-08-21 LAB — FERRITIN: FERRITIN: 17 ng/mL — AB (ref 20–288)

## 2016-08-21 LAB — IRON: IRON: 64 ug/dL (ref 45–160)

## 2016-08-21 LAB — VITAMIN D 25 HYDROXY (VIT D DEFICIENCY, FRACTURES): Vit D, 25-Hydroxy: 84 ng/mL (ref 30–100)

## 2016-08-22 ENCOUNTER — Other Ambulatory Visit: Payer: Self-pay | Admitting: Family Medicine

## 2016-08-22 ENCOUNTER — Other Ambulatory Visit: Payer: Self-pay

## 2016-08-22 MED ORDER — TIZANIDINE HCL 4 MG PO TABS: 4.0000 mg | ORAL_TABLET | Freq: Three times a day (TID) | ORAL | 1 refills | Status: DC

## 2016-08-23 ENCOUNTER — Encounter: Payer: Self-pay | Admitting: Family Medicine

## 2016-08-23 ENCOUNTER — Other Ambulatory Visit: Payer: Self-pay

## 2016-08-23 ENCOUNTER — Ambulatory Visit (INDEPENDENT_AMBULATORY_CARE_PROVIDER_SITE_OTHER): Payer: PPO | Admitting: Family Medicine

## 2016-08-23 VITALS — BP 118/72 | HR 72 | Resp 16 | Ht 63.0 in | Wt 225.0 lb

## 2016-08-23 DIAGNOSIS — F17218 Nicotine dependence, cigarettes, with other nicotine-induced disorders: Secondary | ICD-10-CM | POA: Diagnosis not present

## 2016-08-23 DIAGNOSIS — M5430 Sciatica, unspecified side: Secondary | ICD-10-CM

## 2016-08-23 DIAGNOSIS — G8929 Other chronic pain: Secondary | ICD-10-CM | POA: Diagnosis not present

## 2016-08-23 DIAGNOSIS — I10 Essential (primary) hypertension: Secondary | ICD-10-CM

## 2016-08-23 DIAGNOSIS — F411 Generalized anxiety disorder: Secondary | ICD-10-CM

## 2016-08-23 DIAGNOSIS — E559 Vitamin D deficiency, unspecified: Secondary | ICD-10-CM

## 2016-08-23 DIAGNOSIS — E785 Hyperlipidemia, unspecified: Secondary | ICD-10-CM

## 2016-08-23 DIAGNOSIS — M549 Dorsalgia, unspecified: Secondary | ICD-10-CM

## 2016-08-23 DIAGNOSIS — R7301 Impaired fasting glucose: Secondary | ICD-10-CM

## 2016-08-23 MED ORDER — ROSUVASTATIN CALCIUM 20 MG PO TABS: 20.0000 mg | ORAL_TABLET | Freq: Every day | ORAL | 5 refills | Status: DC

## 2016-08-23 MED ORDER — HYDROCODONE-ACETAMINOPHEN 5-325 MG PO TABS: ORAL_TABLET | ORAL | 0 refills | Status: DC

## 2016-08-23 MED ORDER — TIZANIDINE HCL 4 MG PO TABS: 4.0000 mg | ORAL_TABLET | Freq: Three times a day (TID) | ORAL | 1 refills | Status: DC

## 2016-08-23 NOTE — Assessment & Plan Note (Signed)
Controlled, no change in medication  

## 2016-08-23 NOTE — Patient Instructions (Addendum)
F/u in 3 month, call if you need me before  New pain contract starts today as we have discussed  SCHEDULE and get mammogram done   Excellent labs, reduce dose of crestor to 20 mg daily when finish what you now have  Plan to quit smoking , call [address] for help!     It is important that you exercise regularly at least 30 minutes 5 times a week. If you develop chest pain, have severe difficulty breathing, or feel very tired, stop exercising immediately and seek medical attention   Please work on good  health habits so that your health will improve. 1. Commitment to daily physical activity for 30 to 60  minutes, if you are able to do this.  2. Commitment to wise food choices. Aim for half of your  food intake to be vegetable and fruit, one quarter starchy foods, and one quarter protein. Try to eat on a regular schedule  3 meals per day, snacking between meals should be limited to vegetables or fruits or small portions of nuts. 64 ounces of water per day is generally recommended, unless you have specific health conditions, like heart failure or kidney failure where you will need to limit fluid intake.  3. Commitment to sufficient and a  good quality of physical and mental rest daily, generally between 6 to 8 hours per day.  WITH PERSISTANCE AND PERSEVERANCE, THE IMPOSSIBLE , BECOMES THE NORM!  Steps to Quit Smoking Smoking tobacco can be bad for your health. It can also affect almost every organ in your body. Smoking puts you and people around you at risk for many serious long-lasting (chronic) diseases. Quitting smoking is hard, but it is one of the best things that you can do for your health. It is never too late to quit. What are the benefits of quitting smoking? When you quit smoking, you lower your risk for getting serious diseases and conditions. They can include:  Lung cancer or lung disease.  Heart disease.  Stroke.  Heart attack.  Not being able to have children  (infertility).  Weak bones (osteoporosis) and broken bones (fractures). If you have coughing, wheezing, and shortness of breath, those symptoms may get better when you quit. You may also get sick less often. If you are pregnant, quitting smoking can help to lower your chances of having a baby of low birth weight. What can I do to help me quit smoking? Talk with your doctor about what can help you quit smoking. Some things you can do (strategies) include:  Quitting smoking totally, instead of slowly cutting back how much you smoke over a period of time.  Going to in-person counseling. You are more likely to quit if you go to many counseling sessions.  Using resources and support systems, such as:  Online chats with a Social worker.  Phone quitlines.  Printed Furniture conservator/restorer.  Support groups or group counseling.  Text messaging programs.  Mobile phone apps or applications.  Taking medicines. Some of these medicines may have nicotine in them. If you are pregnant or breastfeeding, do not take any medicines to quit smoking unless your doctor says it is okay. Talk with your doctor about counseling or other things that can help you. Talk with your doctor about using more than one strategy at the same time, such as taking medicines while you are also going to in-person counseling. This can help make quitting easier. What things can I do to make it easier to quit?  Quitting smoking might feel very hard at first, but there is a lot that you can do to make it easier. Take these steps:  Talk to your family and friends. Ask them to support and encourage you.  Call phone quitlines, reach out to support groups, or work with a Social worker.  Ask people who smoke to not smoke around you.  Avoid places that make you want (trigger) to smoke, such as:  Bars.  Parties.  Smoke-break areas at work.  Spend time with people who do not smoke.  Lower the stress in your life. Stress can make you want  to smoke. Try these things to help your stress:  Getting regular exercise.  Deep-breathing exercises.  Yoga.  Meditating.  Doing a body scan. To do this, close your eyes, focus on one area of your body at a time from head to toe, and notice which parts of your body are tense. Try to relax the muscles in those areas.  Download or buy apps on your mobile phone or tablet that can help you stick to your quit plan. There are many free apps, such as QuitGuide from the [employer] Office manager for Disease Control and Prevention). You can find more support from smokefree.gov and other websites. This information is not intended to replace advice given to you by your health care provider. Make sure you discuss any questions you have with your health care provider. Document Released: 05/26/2009 Document Revised: 03/27/2016 Document Reviewed: 12/14/2014 Elsevier Interactive Patient Education  2017 [employer].

## 2016-08-23 NOTE — Assessment & Plan Note (Signed)
Deteriorated. Patient re-educated about  the importance of commitment to a  minimum of 150 minutes of exercise per week.  The importance of healthy food choices with portion control discussed. Encouraged to start a food diary, count calories and to consider  joining a support group. Sample diet sheets offered. Goals set by the patient for the next several months.   Weight /BMI 08/23/2016 07/10/2016 04/17/2016  WEIGHT 225 lb 223 lb 225 lb  HEIGHT '5\' 3"'$  '5\' 3"'$  '5\' 3"'$   BMI 39.86 kg/m2 39.5 kg/m2 39.86 kg/m2

## 2016-08-23 NOTE — Assessment & Plan Note (Signed)
Improved Patient educated about the importance of limiting  Carbohydrate intake , the need to commit to daily physical activity for a minimum of 30 minutes , and to commit weight loss. The fact that changes in all these areas will reduce or eliminate all together the development of diabetes is stressed.   Diabetic Labs Latest Ref Rng & Units 08/20/2016 02/08/2016 10/14/2015 05/29/2014 10/28/2013  HbA1c <5.7 % 5.4 5.8(H) 6.4(H) 5.9(H) 6.1(H)  Chol <200 mg/dL 100 - 134 106 159  HDL >50 mg/dL 51 - 48 48 69  Calc LDL <100 mg/dL 28 - 56 32 57  Triglycerides <150 mg/dL 106 - 151(H) 128 164(H)  Creatinine 0.50 - 0.99 mg/dL 1.09(H) 0.95 0.92 0.88 0.92   BP/Weight 08/23/2016 07/10/2016 04/17/2016 02/08/2016 02/02/2016 10/14/2015 2/83/1517  Systolic BP 616 073 710 626 948 546 270  Diastolic BP 72 74 60 72 78 70 68  Wt. (Lbs) 225 223 225 223 224 225 224  BMI 39.86 39.5 39.86 39.51 39.69 39.87 39.69   No flowsheet data found.

## 2016-08-23 NOTE — Assessment & Plan Note (Signed)
Controlled, no change in medication DASH diet and commitment to daily physical activity for a minimum of 30 minutes discussed and encouraged, as a part of hypertension management. The importance of attaining a healthy weight is also discussed.  BP/Weight 08/23/2016 07/10/2016 04/17/2016 02/08/2016 02/02/2016 10/14/2015 9/43/2003  Systolic BP 794 446 190 122 241 146 431  Diastolic BP 72 74 60 72 78 70 68  Wt. (Lbs) 225 223 225 223 224 225 224  BMI 39.86 39.5 39.86 39.51 39.69 39.87 39.69

## 2016-08-23 NOTE — Assessment & Plan Note (Signed)
Over corrected Hyperlipidemia:Low fat diet discussed and encouraged.   Lipid Panel  Lab Results  Component Value Date   CHOL 100 08/20/2016   HDL 51 08/20/2016   LDLCALC 28 08/20/2016   LDLDIRECT 85 11/14/2007   TRIG 106 08/20/2016   CHOLHDL 2.0 08/20/2016    Dose reduction of medication

## 2016-08-23 NOTE — Assessment & Plan Note (Signed)

## 2016-08-26 ENCOUNTER — Encounter: Payer: Self-pay | Admitting: Family Medicine

## 2016-08-26 DIAGNOSIS — G8929 Other chronic pain: Secondary | ICD-10-CM | POA: Insufficient documentation

## 2016-08-26 NOTE — Assessment & Plan Note (Signed)
Pain contract reviewed and discussed, new pain policy implemented, 3 month supply of medication handed to pt to be filled on a monthly basis, all questions are answered

## 2016-08-26 NOTE — Progress Notes (Signed)
[name]     MRN: [id number]      DOB: 09-27-1945   HPI [name] is here for follow up and re-evaluation of chronic medical conditions, medication management and review of any available recent lab and radiology data.  Pt also here specifically to review and update pain contract with the new policy. All questions and concerns were addressed to her satisfaction Preventive health is updated, specifically  Cancer screening and Immunization.    The PT denies any adverse reactions to current medications since the last visit.  3 month h/o increased personal stress, due to family illness and death, smoking has increased and she does want to quit but will not set a date at this tme  ROS Denies recent fever or chills. Denies sinus pressure, nasal congestion, ear pain or sore throat. Denies chest congestion, productive cough or wheezing. Denies chest pains, palpitations and leg swelling Denies abdominal pain, nausea, vomiting,diarrhea or constipation.   Denies dysuria, frequency, hesitancy or incontinence. C/o chronic  joint pain,  and limitation in mobility, states actually worse due to weight gain and no exercise commitment Denies headaches, seizures, numbness, or tingling. Denies depression,  C/o increased anxiety or insomnia. Denies skin break down or rash.   PE  BP 118/72   Pulse 72   Resp 16   Ht '5\' 3"'$  (1.6 m)   Wt 225 lb (102.1 kg)   SpO2 93%   BMI 39.86 kg/m   Patient alert and oriented and in no cardiopulmonary distress.  HEENT: No facial asymmetry, EOMI,   oropharynx pink and moist.  Neck supple no JVD, no mass.  Chest: Clear to auscultation bilaterally.Decreased air entry CVS: S1, S2 no murmurs, no S3.Regular rate.  ABD: Soft non tender.   Ext: No edema  MS: Decreased  ROM lumbar  Spine, normal in  shoulders, hips and knees.  Skin: Intact, no ulcerations or rash noted.  Psych: Good eye contact, normal affect. Memory intact not anxious or depressed  appearing.  CNS: CN 2-12 intact, power,  normal throughout.no focal deficits noted.   Assessment & Plan  Essential hypertension Controlled, no change in medication DASH diet and commitment to daily physical activity for a minimum of 30 minutes discussed and encouraged, as a part of hypertension management. The importance of attaining a healthy weight is also discussed.  BP/Weight 08/23/2016 07/10/2016 04/17/2016 02/08/2016 02/02/2016 10/14/2015 1/44/3154  Systolic BP 008 676 195 093 267 124 580  Diastolic BP 72 74 60 72 78 70 68  Wt. (Lbs) 225 223 225 223 224 225 224  BMI 39.86 39.5 39.86 39.51 39.69 39.87 39.69       Nicotine dependence Patient counseled for approximately 5 minutes regarding the health risks of ongoing nicotine use, specifically all types of cancer, heart disease, stroke and respiratory failure. The options available for help with cessation ,the behavioral changes to assist the process, and the option to either gradully reduce usage  Or abruptly stop.is also discussed. Pt is also encouraged to set specific goals in number of cigarettes used daily, as well as to set a quit date.      Vitamin D deficiency Controlled, no change in medication   Impaired fasting glucose Improved Patient educated about the importance of limiting  Carbohydrate intake , the need to commit to daily physical activity for a minimum of 30 minutes , and to commit weight loss. The fact that changes in all these areas will reduce or eliminate all together the development of  diabetes is stressed.   Diabetic Labs Latest Ref Rng & Units 08/20/2016 02/08/2016 10/14/2015 05/29/2014 10/28/2013  HbA1c <5.7 % 5.4 5.8(H) 6.4(H) 5.9(H) 6.1(H)  Chol <200 mg/dL 100 - 134 106 159  HDL >50 mg/dL 51 - 48 48 69  Calc LDL <100 mg/dL 28 - 56 32 57  Triglycerides <150 mg/dL 106 - 151(H) 128 164(H)  Creatinine 0.50 - 0.99 mg/dL 1.09(H) 0.95 0.92 0.88 0.92   BP/Weight 08/23/2016 07/10/2016 04/17/2016 02/08/2016  02/02/2016 10/14/2015 02/12/4034  Systolic BP 248 185 909 311 216 244 695  Diastolic BP 72 74 60 72 78 70 68  Wt. (Lbs) 225 223 225 223 224 225 224  BMI 39.86 39.5 39.86 39.51 39.69 39.87 39.69   No flowsheet data found.    Morbid obesity Deteriorated. Patient re-educated about  the importance of commitment to a  minimum of 150 minutes of exercise per week.  The importance of healthy food choices with portion control discussed. Encouraged to start a food diary, count calories and to consider  joining a support group. Sample diet sheets offered. Goals set by the patient for the next several months.   Weight /BMI 08/23/2016 07/10/2016 04/17/2016  WEIGHT 225 lb 223 lb 225 lb  HEIGHT '5\' 3"'$  '5\' 3"'$  '5\' 3"'$   BMI 39.86 kg/m2 39.5 kg/m2 39.86 kg/m2      Hyperlipidemia LDL goal <100 Over corrected Hyperlipidemia:Low fat diet discussed and encouraged.   Lipid Panel  Lab Results  Component Value Date   CHOL 100 08/20/2016   HDL 51 08/20/2016   LDLCALC 28 08/20/2016   LDLDIRECT 85 11/14/2007   TRIG 106 08/20/2016   CHOLHDL 2.0 08/20/2016    Dose reduction of medication   Encounter for chronic pain management Pain contract reviewed and discussed, new pain policy implemented, 3 month supply of medication handed to pt to be filled on a monthly basis, all questions are answered  Back pain with sciatica Chronic an d currently increased despite back surgery, significant pain persists, weight loss encouraged and back strenghtening exercises, also good posture. No med change at this time, hope expressed that reduction in medications will occur over time   Anxiety state increased due to recent family stress, no need for therapy currently, has been in the past and has benefited

## 2016-08-26 NOTE — Assessment & Plan Note (Signed)
increased due to recent family stress, no need for therapy currently, has been in the past and has benefited

## 2016-08-26 NOTE — Assessment & Plan Note (Signed)
Chronic an d currently increased despite back surgery, significant pain persists, weight loss encouraged and back strenghtening exercises, also good posture. No med change at this time, hope expressed that reduction in medications will occur over time

## 2016-08-30 ENCOUNTER — Other Ambulatory Visit: Payer: Self-pay | Admitting: Family Medicine

## 2016-08-31 ENCOUNTER — Telehealth: Payer: Self-pay | Admitting: Family Medicine

## 2016-08-31 ENCOUNTER — Other Ambulatory Visit: Payer: Self-pay

## 2016-08-31 MED ORDER — CELECOXIB 400 MG PO CAPS: 400.0000 mg | ORAL_CAPSULE | Freq: Every day | ORAL | 1 refills | Status: DC

## 2016-08-31 NOTE — Telephone Encounter (Signed)
Med refilled.

## 2016-08-31 NOTE — Telephone Encounter (Signed)
[place] is needing a refill on celecoxib (CELEBREX) 400 MG capsule  Called to [facility] in [place], please advise?

## 2016-09-20 ENCOUNTER — Telehealth: Payer: Self-pay | Admitting: Family Medicine

## 2016-09-20 MED ORDER — OSELTAMIVIR PHOSPHATE 75 MG PO CAPS: 75.0000 mg | ORAL_CAPSULE | Freq: Two times a day (BID) | ORAL | 0 refills | Status: AC

## 2016-09-20 NOTE — Telephone Encounter (Signed)
Acute onset of fever Yes.   Headache Yes.   Body ache Yes.   Fatigue Yes.   Non productive cough Yes.   Sore throat Yes.   Nasal discharge Yes.    Onset between 1-4 days of possible exposure Yes.    Recommendation:  Fluid, rest, Tylenol for control of fever  Expect 5 days before feeling better and not so contagious and generally better in 7-10 days.  Standard treatment Tama flu 75 mg 1 tablet twice daily times 5 days  Please call office if symptoms do not improve or worsen in 5 days after beginning treatment.

## 2016-09-20 NOTE — Telephone Encounter (Signed)
[place] is c/o headache, bodyache, fever, cong symptoms started yesterday and she is asking for Tami-flu, please advise?

## 2016-09-22 ENCOUNTER — Other Ambulatory Visit: Payer: Self-pay | Admitting: Family Medicine

## 2016-09-24 NOTE — Telephone Encounter (Signed)
Last vit d level was 57 on '6 28 17  '$ Do you want to refill?

## 2016-10-02 ENCOUNTER — Other Ambulatory Visit: Payer: Self-pay

## 2016-10-02 ENCOUNTER — Other Ambulatory Visit: Payer: Self-pay | Admitting: Family Medicine

## 2016-10-02 MED ORDER — ALBUTEROL SULFATE HFA 108 (90 BASE) MCG/ACT IN AERS: INHALATION_SPRAY | RESPIRATORY_TRACT | 2 refills | Status: DC

## 2016-10-03 ENCOUNTER — Other Ambulatory Visit: Payer: Self-pay

## 2016-10-03 ENCOUNTER — Other Ambulatory Visit: Payer: Self-pay | Admitting: Family Medicine

## 2016-10-03 MED ORDER — ALBUTEROL SULFATE HFA 108 (90 BASE) MCG/ACT IN AERS: INHALATION_SPRAY | RESPIRATORY_TRACT | 2 refills | Status: DC

## 2016-10-03 MED ORDER — VITAMIN D (ERGOCALCIFEROL) 1.25 MG (50000 UNIT) PO CAPS: 50000.0000 [IU] | ORAL_CAPSULE | ORAL | 5 refills | Status: DC

## 2016-10-03 NOTE — Telephone Encounter (Signed)
No refill.please

## 2016-10-03 NOTE — Telephone Encounter (Signed)
Last vit d level '1 8 18 '$ = 84  Do you want to renew this med?

## 2016-10-18 ENCOUNTER — Ambulatory Visit: Payer: Self-pay | Admitting: Family Medicine

## 2016-10-19 ENCOUNTER — Other Ambulatory Visit: Payer: Self-pay | Admitting: Family Medicine

## 2016-10-19 ENCOUNTER — Other Ambulatory Visit: Payer: Self-pay

## 2016-10-19 MED ORDER — CELECOXIB 400 MG PO CAPS: 400.0000 mg | ORAL_CAPSULE | Freq: Every day | ORAL | 2 refills | Status: DC

## 2016-11-14 ENCOUNTER — Other Ambulatory Visit: Payer: Self-pay

## 2016-11-14 MED ORDER — TIZANIDINE HCL 4 MG PO TABS: 4.0000 mg | ORAL_TABLET | Freq: Three times a day (TID) | ORAL | 1 refills | Status: DC

## 2016-11-14 MED ORDER — DILTIAZEM HCL ER COATED BEADS 120 MG PO CP24: 120.0000 mg | ORAL_CAPSULE | Freq: Every day | ORAL | 1 refills | Status: DC

## 2016-11-14 MED ORDER — FUROSEMIDE 20 MG PO TABS: 20.0000 mg | ORAL_TABLET | Freq: Two times a day (BID) | ORAL | 1 refills | Status: DC

## 2016-11-14 MED ORDER — OMEPRAZOLE 40 MG PO CPDR: 40.0000 mg | DELAYED_RELEASE_CAPSULE | Freq: Every day | ORAL | 1 refills | Status: DC

## 2016-11-14 MED ORDER — FLUOXETINE HCL 20 MG PO CAPS: 20.0000 mg | ORAL_CAPSULE | Freq: Every day | ORAL | 1 refills | Status: DC

## 2016-11-14 MED ORDER — POTASSIUM CHLORIDE CRYS ER 20 MEQ PO TBCR: 20.0000 meq | EXTENDED_RELEASE_TABLET | Freq: Every day | ORAL | 1 refills | Status: DC

## 2016-11-14 MED ORDER — LISINOPRIL 10 MG PO TABS: 10.0000 mg | ORAL_TABLET | Freq: Every day | ORAL | 1 refills | Status: DC

## 2016-11-14 MED ORDER — VITAMIN D (ERGOCALCIFEROL) 1.25 MG (50000 UNIT) PO CAPS: 50000.0000 [IU] | ORAL_CAPSULE | ORAL | 5 refills | Status: DC

## 2016-11-14 MED ORDER — ROSUVASTATIN CALCIUM 20 MG PO TABS: 20.0000 mg | ORAL_TABLET | Freq: Every day | ORAL | 5 refills | Status: DC

## 2016-11-21 ENCOUNTER — Ambulatory Visit: Payer: Self-pay | Admitting: Family Medicine

## 2016-11-25 ENCOUNTER — Other Ambulatory Visit: Payer: Self-pay | Admitting: Family Medicine

## 2016-11-26 ENCOUNTER — Telehealth: Payer: Self-pay | Admitting: Family Medicine

## 2016-11-26 ENCOUNTER — Other Ambulatory Visit: Payer: Self-pay

## 2016-11-26 MED ORDER — ALPRAZOLAM 1 MG PO TABS: 1.0000 mg | ORAL_TABLET | Freq: Two times a day (BID) | ORAL | 3 refills | Status: DC

## 2016-11-26 NOTE — Telephone Encounter (Signed)
Patient calling to request Hydrocodone for back pain.  She states Dr. Moshe Cipro was the last to fill it in back in Jan of this year.  cb  [phone]

## 2016-11-26 NOTE — Telephone Encounter (Signed)
Patient was given 3 pain prescriptions in January and told to follow up in 3 months to get her refill. This was never scheduled. Can ONLY get pain med refill during an office visit. This needs to be scheduled.

## 2016-11-28 ENCOUNTER — Other Ambulatory Visit: Payer: Self-pay | Admitting: Family Medicine

## 2016-12-04 ENCOUNTER — Ambulatory Visit (INDEPENDENT_AMBULATORY_CARE_PROVIDER_SITE_OTHER): Payer: PPO | Admitting: Family Medicine

## 2016-12-04 ENCOUNTER — Encounter: Payer: Self-pay | Admitting: Family Medicine

## 2016-12-04 VITALS — BP 140/76 | HR 102 | Resp 16 | Ht 63.0 in | Wt 226.0 lb

## 2016-12-04 DIAGNOSIS — I10 Essential (primary) hypertension: Secondary | ICD-10-CM

## 2016-12-04 DIAGNOSIS — G8929 Other chronic pain: Secondary | ICD-10-CM | POA: Diagnosis not present

## 2016-12-04 DIAGNOSIS — F17218 Nicotine dependence, cigarettes, with other nicotine-induced disorders: Secondary | ICD-10-CM | POA: Diagnosis not present

## 2016-12-04 MED ORDER — HYDROCODONE-ACETAMINOPHEN 5-325 MG PO TABS: ORAL_TABLET | ORAL | 0 refills | Status: DC

## 2016-12-04 NOTE — Assessment & Plan Note (Signed)
Deteriorated. Patient re-educated about  the importance of commitment to a  minimum of 150 minutes of exercise per week.  The importance of healthy food choices with portion control discussed. Encouraged to start a food diary, count calories and to consider  joining a support group. Sample diet sheets offered. Goals set by the patient for the next several months.   Weight /BMI 12/04/2016 08/23/2016 07/10/2016  WEIGHT 226 lb 225 lb 223 lb  HEIGHT '5\' 3"'$  '5\' 3"'$  '5\' 3"'$   BMI 40.03 kg/m2 39.86 kg/m2 39.5 kg/m2

## 2016-12-04 NOTE — Assessment & Plan Note (Signed)

## 2016-12-04 NOTE — Assessment & Plan Note (Signed)
Pain contract reviewed with patient and clearly stated and in writing the need for her to remain on schedule with visits, and filling scripts. Her regisitry is reviewed. She understands clearly that if there is another breach in contract, this will be terminated. Twelve weeks of medication prescribed with appropriate follow up

## 2016-12-04 NOTE — Assessment & Plan Note (Signed)
Controlled, no change in medication DASH diet and commitment to daily physical activity for a minimum of 30 minutes discussed and encouraged, as a part of hypertension management. The importance of attaining a healthy weight is also discussed.  BP/Weight 12/04/2016 08/23/2016 07/10/2016 04/17/2016 02/08/2016 11/29/3788 09/17/971  Systolic BP 532 992 426 834 196 222 979  Diastolic BP 76 72 74 60 72 78 70  Wt. (Lbs) 226 225 223 225 223 224 225  BMI 40.03 39.86 39.5 39.86 39.51 39.69 39.87

## 2016-12-04 NOTE — Patient Instructions (Addendum)
Annual physical exam July 17, call if you need me sooner  Please ADHERE to scheduled visits so that you can remain in the contract  pLEASE schedule your mammogram  Please work on good  health habits so that your health will improve. 1. Commitment to daily physical activity for 30 to 60  minutes, if you are able to do this.  2. Commitment to wise food choices. Aim for half of your  food intake to be vegetable and fruit, one quarter starchy foods, and one quarter protein. Try to eat on a regular schedule  3 meals per day, snacking between meals should be limited to vegetables or fruits or small portions of nuts. 64 ounces of water per day is generally recommended, unless you have specific health conditions, like heart failure or kidney failure where you will need to limit fluid intake.  3. Commitment to sufficient and a  good quality of physical and mental rest daily, generally between 6 to 8 hours per day.  WITH PERSISTANCE AND PERSEVERANCE, THE IMPOSSIBLE , BECOMES THE NORM!  Steps to Quit Smoking Smoking tobacco can be bad for your health. It can also affect almost every organ in your body. Smoking puts you and people around you at risk for many serious long-lasting (chronic) diseases. Quitting smoking is hard, but it is one of the best things that you can do for your health. It is never too late to quit. What are the benefits of quitting smoking? When you quit smoking, you lower your risk for getting serious diseases and conditions. They can include:  Lung cancer or lung disease.  Heart disease.  Stroke.  Heart attack.  Not being able to have children (infertility).  Weak bones (osteoporosis) and broken bones (fractures). If you have coughing, wheezing, and shortness of breath, those symptoms may get better when you quit. You may also get sick less often. If you are pregnant, quitting smoking can help to lower your chances of having a baby of low birth weight. What can I do to  help me quit smoking? Talk with your doctor about what can help you quit smoking. Some things you can do (strategies) include:  Quitting smoking totally, instead of slowly cutting back how much you smoke over a period of time.  Going to in-person counseling. You are more likely to quit if you go to many counseling sessions.  Using resources and support systems, such as:  Online chats with a Social worker.  Phone quitlines.  Printed Furniture conservator/restorer.  Support groups or group counseling.  Text messaging programs.  Mobile phone apps or applications.  Taking medicines. Some of these medicines may have nicotine in them. If you are pregnant or breastfeeding, do not take any medicines to quit smoking unless your doctor says it is okay. Talk with your doctor about counseling or other things that can help you. Talk with your doctor about using more than one strategy at the same time, such as taking medicines while you are also going to in-person counseling. This can help make quitting easier. What things can I do to make it easier to quit? Quitting smoking might feel very hard at first, but there is a lot that you can do to make it easier. Take these steps:  Talk to your family and friends. Ask them to support and encourage you.  Call phone quitlines, reach out to support groups, or work with a Social worker.  Ask people who smoke to not smoke around you.  Avoid places that  make you want (trigger) to smoke, such as:  Bars.  Parties.  Smoke-break areas at work.  Spend time with people who do not smoke.  Lower the stress in your life. Stress can make you want to smoke. Try these things to help your stress:  Getting regular exercise.  Deep-breathing exercises.  Yoga.  Meditating.  Doing a body scan. To do this, close your eyes, focus on one area of your body at a time from head to toe, and notice which parts of your body are tense. Try to relax the muscles in those  areas.  Download or buy apps on your mobile phone or tablet that can help you stick to your quit plan. There are many free apps, such as QuitGuide from the [employer] Office manager for Disease Control and Prevention). You can find more support from smokefree.gov and other websites. This information is not intended to replace advice given to you by your health care provider. Make sure you discuss any questions you have with your health care provider. Document Released: 05/26/2009 Document Revised: 03/27/2016 Document Reviewed: 12/14/2014 Elsevier Interactive Patient Education  2017 [employer].

## 2016-12-04 NOTE — Progress Notes (Signed)
[name]     MRN: [id number]      DOB: 10/08/1945   HPI [name] is here for follow up and re-evaluation of chronic medical conditions, medication management and review of any available recent lab and radiology data.  Preventive health is updated, specifically  Cancer screening and Immunization.   Questions or concerns regarding consultations or procedures which the PT has had in the interim are  addressed. The PT denies any adverse reactions to current medications since the last visit.  There are no new concerns.  There are no specific complaints  Pt in primarily fro pin maangement , this one time she has defaulted from kleeping schedule, I explained clearly to her that should there be a recurrence, she will need chronic pain management contract terminated, and she understands this. States she needs to [facility] her 3 times daily medication to have adequate pain control ROS Denies recent fever or chills. Denies sinus pressure, nasal congestion, ear pain or sore throat. Denies chest congestion, productive cough or wheezing. Denies chest pains, palpitations and leg swelling Denies abdominal pain, nausea, vomiting,diarrhea or constipation.   Denies dysuria, frequency, hesitancy or incontinence. Denies uncontrolled  joint pain, swelling and limitation in mobility. Denies headaches, seizures, numbness, or tingling. Denies depression, anxiety or insomnia. Denies skin break down or rash.   PE  BP 140/76   Pulse (!) 102   Resp 16   Ht '5\' 3"'$  (1.6 m)   Wt 226 lb (102.5 kg)   SpO2 92%   BMI 40.03 kg/m   Patient alert and oriented and in no cardiopulmonary distress.  HEENT: No facial asymmetry, EOMI,   oropharynx pink and moist.  Neck supple no JVD, no mass.  Chest: Clear to auscultation bilaterally.  CVS: S1, S2 no murmurs, no S3.Regular rate.  ABD: Soft non tender.   Ext: No edema  MS: Adequate though reduced  ROM spine, shoulders, hips and knees.  Skin: Intact, no  ulcerations or rash noted.  Psych: Good eye contact, normal affect. Memory intact not anxious or depressed appearing.  CNS: CN 2-12 intact, power,  normal throughout.no focal deficits noted.   Assessment & Plan  Essential hypertension Controlled, no change in medication DASH diet and commitment to daily physical activity for a minimum of 30 minutes discussed and encouraged, as a part of hypertension management. The importance of attaining a healthy weight is also discussed.  BP/Weight 12/04/2016 08/23/2016 07/10/2016 04/17/2016 02/08/2016 11/26/6061 0/08/6008  Systolic BP 932 355 732 202 542 706 237  Diastolic BP 76 72 74 60 72 78 70  Wt. (Lbs) 226 225 223 225 223 224 225  BMI 40.03 39.86 39.5 39.86 39.51 39.69 39.87       Nicotine dependence Patient counseled for approximately 5 minutes regarding the health risks of ongoing nicotine use, specifically all types of cancer, heart disease, stroke and respiratory failure. The options available for help with cessation ,the behavioral changes to assist the process, and the option to either gradully reduce usage  Or abruptly stop.is also discussed. Pt is also encouraged to set specific goals in number of cigarettes used daily, as well as to set a quit date.     Encounter for chronic pain management Pain contract reviewed with patient and clearly stated and in writing the need for her to remain on schedule with visits, and filling scripts. Her regisitry is reviewed. She understands clearly that if there is another breach in contract, this will be terminated. Twelve weeks of  medication prescribed with appropriate follow up  Morbid obesity Deteriorated. Patient re-educated about  the importance of commitment to a  minimum of 150 minutes of exercise per week.  The importance of healthy food choices with portion control discussed. Encouraged to start a food diary, count calories and to consider  joining a support group. Sample diet sheets  offered. Goals set by the patient for the next several months.   Weight /BMI 12/04/2016 08/23/2016 07/10/2016  WEIGHT 226 lb 225 lb 223 lb  HEIGHT '5\' 3"'$  '5\' 3"'$  '5\' 3"'$   BMI 40.03 kg/m2 39.86 kg/m2 39.5 kg/m2

## 2017-01-14 ENCOUNTER — Ambulatory Visit (INDEPENDENT_AMBULATORY_CARE_PROVIDER_SITE_OTHER): Payer: PPO | Admitting: Family Medicine

## 2017-01-14 ENCOUNTER — Encounter: Payer: Self-pay | Admitting: Family Medicine

## 2017-01-14 VITALS — BP 140/80 | HR 90 | Resp 18

## 2017-01-14 DIAGNOSIS — J441 Chronic obstructive pulmonary disease with (acute) exacerbation: Secondary | ICD-10-CM

## 2017-01-14 DIAGNOSIS — F17218 Nicotine dependence, cigarettes, with other nicotine-induced disorders: Secondary | ICD-10-CM

## 2017-01-14 MED ORDER — METHYLPREDNISOLONE ACETATE 80 MG/ML IJ SUSP
120.0000 mg | Freq: Once | INTRAMUSCULAR | Status: AC
  Administered 2017-01-14: 120 mg via INTRAMUSCULAR

## 2017-01-14 MED ORDER — ALBUTEROL SULFATE (2.5 MG/3ML) 0.083% IN NEBU
2.5000 mg | INHALATION_SOLUTION | Freq: Once | RESPIRATORY_TRACT | Status: AC
  Administered 2017-01-14: 2.5 mg via RESPIRATORY_TRACT

## 2017-01-14 MED ORDER — IPRATROPIUM BROMIDE 0.02 % IN SOLN
0.5000 mg | Freq: Once | RESPIRATORY_TRACT | Status: AC
  Administered 2017-01-14: 0.5 mg via RESPIRATORY_TRACT

## 2017-01-14 MED ORDER — BUDESONIDE-FORMOTEROL FUMARATE 80-4.5 MCG/ACT IN AERO: 2.0000 | INHALATION_SPRAY | Freq: Two times a day (BID) | RESPIRATORY_TRACT | 3 refills | Status: DC

## 2017-01-14 MED ORDER — PREDNISONE 10 MG (21) PO TBPK: ORAL_TABLET | ORAL | 0 refills | Status: DC

## 2017-01-14 NOTE — Patient Instructions (Addendum)
F/u as before , call I if you need me sooner  You are treated for COPD flare  Breathing treatment and depo medrol in office  Prednisone dose pack and symbicort sent in  You need to  Commit to twice daily symbicort every day, also for next 3 to 5 days neb treatments at least 3 times daily  If you get worse need to go straight o the ED  Work on QUITTING smoking  I am referring you for chest scan to screen yearly for lung cancer per guidelines     Steps to Quit Smoking Smoking tobacco can be bad for your health. It can also affect almost every organ in your body. Smoking puts you and people around you at risk for many serious long-lasting (chronic) diseases. Quitting smoking is hard, but it is one of the best things that you can do for your health. It is never too late to quit. What are the benefits of quitting smoking? When you quit smoking, you lower your risk for getting serious diseases and conditions. They can include:  Lung cancer or lung disease.  Heart disease.  Stroke.  Heart attack.  Not being able to have children (infertility).  Weak bones (osteoporosis) and broken bones (fractures).  If you have coughing, wheezing, and shortness of breath, those symptoms may get better when you quit. You may also get sick less often. If you are pregnant, quitting smoking can help to lower your chances of having a baby of low birth weight. What can I do to help me quit smoking? Talk with your doctor about what can help you quit smoking. Some things you can do (strategies) include:  Quitting smoking totally, instead of slowly cutting back how much you smoke over a period of time.  Going to in-person counseling. You are more likely to quit if you go to many counseling sessions.  Using resources and support systems, such as: ? Database administrator with a Social worker. ? Phone quitlines. ? Careers information officer. ? Support groups or group counseling. ? Text messaging  programs. ? Mobile phone apps or applications.  Taking medicines. Some of these medicines may have nicotine in them. If you are pregnant or breastfeeding, do not take any medicines to quit smoking unless your doctor says it is okay. Talk with your doctor about counseling or other things that can help you.  Talk with your doctor about using more than one strategy at the same time, such as taking medicines while you are also going to in-person counseling. This can help make quitting easier. What things can I do to make it easier to quit? Quitting smoking might feel very hard at first, but there is a lot that you can do to make it easier. Take these steps:  Talk to your family and friends. Ask them to support and encourage you.  Call phone quitlines, reach out to support groups, or work with a Social worker.  Ask people who smoke to not smoke around you.  Avoid places that make you want (trigger) to smoke, such as: ? Bars. ? Parties. ? Smoke-break areas at work.  Spend time with people who do not smoke.  Lower the stress in your life. Stress can make you want to smoke. Try these things to help your stress: ? Getting regular exercise. ? Deep-breathing exercises. ? Yoga. ? Meditating. ? Doing a body scan. To do this, close your eyes, focus on one area of your body at a time from head to toe, and  notice which parts of your body are tense. Try to relax the muscles in those areas.  Download or buy apps on your mobile phone or tablet that can help you stick to your quit plan. There are many free apps, such as QuitGuide from the [employer] Office manager for Disease Control and Prevention). You can find more support from smokefree.gov and other websites.  This information is not intended to replace advice given to you by your health care provider. Make sure you discuss any questions you have with your health care provider. Document Released: 05/26/2009 Document Revised: 03/27/2016 Document Reviewed:  12/14/2014 Elsevier Interactive Patient Education  2018 [employer].

## 2017-01-14 NOTE — Assessment & Plan Note (Signed)
Neb treatment x 1 depo medrol 120 mg im and pred dose pack

## 2017-01-15 ENCOUNTER — Encounter: Payer: Self-pay | Admitting: Family Medicine

## 2017-01-15 NOTE — Progress Notes (Signed)
   [name]     MRN: [id number]      DOB: Jul 02, 1946   HPI [name] is here with a 1 week h/o increased wheezing and shortness of breath which significantly worsened in past 4 days, denies fever or chills or sputum, took only one neb treatment at home since symptom onset ,and only has a rescue inhaler , she also continues to smoke, and is unwilling to set a quit date ROS Denies recent fever or chills. Denies sinus pressure, nasal congestion, ear pain or sore throat. . Denies chest pains, palpitations and leg swelling Denies abdominal pain, nausea, vomiting,diarrhea or constipation.   Denies dysuria, frequency, hesitancy or incontinence. Denies uncontrolled joint pain, swelling and limitation in mobility. Denies headaches, seizures, numbness, or tingling. Denies depression, anxiety or insomnia. Denies skin break down or rash.   PE  BP 140/80   Pulse 90   Resp 18   SpO2 (!) 86%   Patient alert and oriented and in moderate cardiopulmonary distress.  HEENT: No facial asymmetry, EOMI,   oropharynx pink and moist.  Neck supple no JVD, no mass.  Chest: decreased air entry bilateral wheezes , no crackles  CVS: S1, S2 no murmurs, no S3.Regular rate.  ABD: Soft non tender.   Ext: No edema  MS: Adequate though reduced  ROM spine, shoulders, hips and knees.  Skin: Intact, no ulcerations or rash noted.  Psych: Good eye contact, normal affect. Memory intact not anxious or depressed appearing.  CNS: CN 2-12 intact, power,  normal throughout.no focal deficits noted.   Assessment & Plan  COPD with acute exacerbation ([place]) Neb treatment x 1 depo medrol 120 mg im and pred dose pack  Nicotine dependence Patient is asked and  confirms current  Nicotine use.  Five to seven minutes of time is spent in counseling the patient of the need to quit smoking  Advice to quit is delivered clearly specifically in reducing the risk of developing heart disease, having a stroke, or of  developing all types of cancer, especially lung and oral cancer. Improvement in breathing and exercise tolerance and quality of life is also discussed, as is the economic benefit. In this particular pt with f/h of lung cancer in a sibling and personal h/o COPD the benefit of quitting smoking is stressed  Assessment of willingness to quit or to make an attempt to quit is made and documented  Assistance in quit attempt is made with several and varied options presented, based on patient's desire and need. These include  literature, local classes available, 1800 QUIT NOW number, OTC and prescription medication.  The GOAL to be NICOTINE FREE is re emphasized.  The patient has set a personal goal of either reduction or discontinuation and follow up is arranged between 6 an 16 weeks.

## 2017-01-15 NOTE — Assessment & Plan Note (Addendum)
Patient is asked and  confirms current  Nicotine use.  Five to seven minutes of time is spent in counseling the patient of the need to quit smoking  Advice to quit is delivered clearly specifically in reducing the risk of developing heart disease, having a stroke, or of developing all types of cancer, especially lung and oral cancer. Improvement in breathing and exercise tolerance and quality of life is also discussed, as is the economic benefit. In this particular pt with f/h of lung cancer in a sibling and personal h/o COPD the benefit of quitting smoking is stressed  Assessment of willingness to quit or to make an attempt to quit is made and documented  Assistance in quit attempt is made with several and varied options presented, based on patient's desire and need. These include  literature, local classes available, 1800 QUIT NOW number, OTC and prescription medication.  The GOAL to be NICOTINE FREE is re emphasized.  The patient has set a personal goal of either reduction or discontinuation and follow up is arranged between 6 an 16 weeks.

## 2017-01-30 ENCOUNTER — Telehealth: Payer: Self-pay | Admitting: *Deleted

## 2017-01-30 NOTE — Telephone Encounter (Signed)
I have resubmitted the precert with the facilty npi marked as urgent

## 2017-01-30 NOTE — Telephone Encounter (Signed)
Kylie called from [facility] left message during lunch stating patient is scheduled to come in tomorrow for a CT Chest with contrast and the precert was made to physicians office and not [facility]. Please call kylie back at [phone].

## 2017-01-31 ENCOUNTER — Ambulatory Visit (HOSPITAL_COMMUNITY): Payer: PPO

## 2017-02-01 NOTE — Telephone Encounter (Signed)
It was approved [id number]

## 2017-02-07 ENCOUNTER — Ambulatory Visit (HOSPITAL_COMMUNITY): Admission: RE | Admit: 2017-02-07 | Payer: PPO | Source: Ambulatory Visit

## 2017-02-11 ENCOUNTER — Encounter: Payer: Self-pay | Admitting: Family Medicine

## 2017-02-19 ENCOUNTER — Ambulatory Visit (HOSPITAL_COMMUNITY)
Admission: RE | Admit: 2017-02-19 | Discharge: 2017-02-19 | Disposition: A | Discharge status: Home / Self Care | Payer: PPO | Source: Ambulatory Visit | Attending: Family Medicine | Admitting: Family Medicine

## 2017-02-19 DIAGNOSIS — I700 Atherosclerosis of aorta: Secondary | ICD-10-CM | POA: Diagnosis not present

## 2017-02-19 DIAGNOSIS — J439 Emphysema, unspecified: Secondary | ICD-10-CM | POA: Diagnosis not present

## 2017-02-19 DIAGNOSIS — F17218 Nicotine dependence, cigarettes, with other nicotine-induced disorders: Secondary | ICD-10-CM | POA: Diagnosis not present

## 2017-02-19 DIAGNOSIS — R918 Other nonspecific abnormal finding of lung field: Secondary | ICD-10-CM | POA: Diagnosis not present

## 2017-02-22 ENCOUNTER — Other Ambulatory Visit: Payer: Self-pay | Admitting: Family Medicine

## 2017-02-22 NOTE — Telephone Encounter (Signed)
New Message   *STAT* If patient is at the pharmacy, call can be transferred to refill team.   1. Which medications need to be refilled? (please list name of each medication and dose if known)  alprazolam (xanax) 1 mg tablet  2. Which pharmacy/location (including street and city if local pharmacy) is medication to be sent to? [facility] [address], [place], [place]  3. Do they need a 30 day or 90 day supply? 30 day supply

## 2017-02-25 ENCOUNTER — Other Ambulatory Visit: Payer: Self-pay | Admitting: Family Medicine

## 2017-02-25 NOTE — Telephone Encounter (Signed)
Seen [phone]

## 2017-02-26 ENCOUNTER — Ambulatory Visit (INDEPENDENT_AMBULATORY_CARE_PROVIDER_SITE_OTHER): Payer: PPO | Admitting: Family Medicine

## 2017-02-26 ENCOUNTER — Encounter: Payer: Self-pay | Admitting: Family Medicine

## 2017-02-26 VITALS — BP 104/74 | HR 91 | Resp 16 | Ht 63.0 in | Wt 232.0 lb

## 2017-02-26 DIAGNOSIS — Z87891 Personal history of nicotine dependence: Secondary | ICD-10-CM | POA: Diagnosis not present

## 2017-02-26 DIAGNOSIS — I10 Essential (primary) hypertension: Secondary | ICD-10-CM | POA: Diagnosis not present

## 2017-02-26 DIAGNOSIS — F411 Generalized anxiety disorder: Secondary | ICD-10-CM

## 2017-02-26 DIAGNOSIS — R911 Solitary pulmonary nodule: Secondary | ICD-10-CM | POA: Diagnosis not present

## 2017-02-26 DIAGNOSIS — J439 Emphysema, unspecified: Secondary | ICD-10-CM

## 2017-02-26 DIAGNOSIS — G8929 Other chronic pain: Secondary | ICD-10-CM | POA: Diagnosis not present

## 2017-02-26 DIAGNOSIS — R918 Other nonspecific abnormal finding of lung field: Secondary | ICD-10-CM | POA: Insufficient documentation

## 2017-02-26 DIAGNOSIS — K219 Gastro-esophageal reflux disease without esophagitis: Secondary | ICD-10-CM | POA: Diagnosis not present

## 2017-02-26 MED ORDER — HYDROCODONE-ACETAMINOPHEN 5-325 MG PO TABS: ORAL_TABLET | ORAL | 0 refills | Status: DC

## 2017-02-26 NOTE — Assessment & Plan Note (Signed)
remains nicotine free, applauded on this

## 2017-02-26 NOTE — Assessment & Plan Note (Signed)
 registry reviewed, patient is compliant 121 week script provided with appropriate follow up Reports adequate pain control

## 2017-02-26 NOTE — Assessment & Plan Note (Signed)
Controlled, no change in medication  

## 2017-02-26 NOTE — Assessment & Plan Note (Signed)
Deteriorated. Patient re-educated about  the importance of commitment to a  minimum of 150 minutes of exercise per week.  The importance of healthy food choices with portion control discussed. Encouraged to start a food diary, count calories and to consider  joining a support group. Sample diet sheets offered. Goals set by the patient for the next several months.   Weight /BMI 02/26/2017 12/04/2016 08/23/2016  WEIGHT 232 lb 226 lb 225 lb  HEIGHT 5\' 3"  5\' 3"  5\' 3"   BMI 41.1 kg/m2 40.03 kg/m2 39.86 kg/m2

## 2017-02-26 NOTE — Assessment & Plan Note (Signed)
Repeat in 1 year 

## 2017-02-26 NOTE — Progress Notes (Signed)
   [name]     MRN: [id number]      DOB: 1946/06/03   HPI Ms. Bocchino is here for follow up and re-evaluation of chronic medical conditions, chronic pain management , review of recent chest scan Preventive health is updated, specifically  Cancer screening and Immunization.   Questions or concerns regarding consultations or procedures which the PT has had in the interim are  addressed. The PT denies any adverse reactions to current medications since the last visit.  C/o concern regarding depression in her spouse and his failing health , will try to address this directly with him Still nicotine free  ROS Denies recent fever or chills. Denies sinus pressure, nasal congestion, ear pain or sore throat. Denies chest congestion, productive cough or wheezing. Denies chest pains, palpitations and leg swelling Denies abdominal pain, nausea, vomiting,diarrhea or constipation.   Denies dysuria, frequency, hesitancy or incontinence. Denies uncontrolled joint pain, swelling and limitation in mobility. Denies headaches, seizures, numbness, or tingling. Denies depression,c/o increased  anxiety deniues insomnia. Denies skin break down or rash.   PE  BP 104/74   Pulse 91   Resp 16   Ht 5\' 3"  (1.6 m)   Wt 232 lb (105.2 kg)   SpO2 91%   BMI 41.10 kg/m   Patient alert and oriented and in no cardiopulmonary distress.  HEENT: No facial asymmetry, EOMI,   oropharynx pink and moist.  Neck supple no JVD, no mass.  Chest: Clear to auscultation bilaterally.Decreased though adequate air entry  CVS: S1, S2 no murmurs, no S3.Regular rate.  ABD: Soft non tender.   Ext: No edema  MS: Adequate though reduced  ROM spine, shoulders, hips and knees.  Skin: Intact, no ulcerations or rash noted.  Psych: Good eye contact, normal affect. Memory intact not anxious or depressed appearing.  CNS: CN 2-12 intact, power,  normal throughout.no focal deficits noted.   Assessment & Plan  Encounter for  chronic pain management [place] registry reviewed, patient is compliant 121 week script provided with appropriate follow up Reports adequate pain control  Anxiety state Increased due to spouse's health , will address with him in the near fuutre  H/O nicotine dependence remains nicotine free, applauded on this  Morbid obesity Deteriorated. Patient re-educated about  the importance of commitment to a  minimum of 150 minutes of exercise per week.  The importance of healthy food choices with portion control discussed. Encouraged to start a food diary, count calories and to consider  joining a support group. Sample diet sheets offered. Goals set by the patient for the next several months.   Weight /BMI 02/26/2017 12/04/2016 08/23/2016  WEIGHT 232 lb 226 lb 225 lb  HEIGHT 5\' 3"  5\' 3"  5\' 3"   BMI 41.1 kg/m2 40.03 kg/m2 39.86 kg/m2      Essential hypertension Controlled, no change in medication DASH diet and commitment to daily physical activity for a minimum of 30 minutes discussed and encouraged, as a part of hypertension management. The importance of attaining a healthy weight is also discussed.  BP/Weight 02/26/2017 01/14/2017 12/04/2016 08/23/2016 07/10/2016 04/17/2016 6/78/9381  Systolic BP 017 510 258 527 782 423 536  Diastolic BP 74 80 76 72 74 60 72  Wt. (Lbs) 232 - 226 225 223 225 223  BMI 41.1 - 40.03 39.86 39.5 39.86 39.51       GERD Controlled, no change in medication   Lung nodule, solitary Repeat in 1 year

## 2017-02-26 NOTE — Assessment & Plan Note (Signed)
Controlled, no change in medication DASH diet and commitment to daily physical activity for a minimum of 30 minutes discussed and encouraged, as a part of hypertension management. The importance of attaining a healthy weight is also discussed.  BP/Weight 02/26/2017 01/14/2017 12/04/2016 08/23/2016 07/10/2016 04/17/2016 1/93/7902  Systolic BP 409 735 329 [phone]  Diastolic BP 74 80 76 72 74 60 72  Wt. (Lbs) 232 - 226 225 223 225 223  BMI 41.1 - 40.03 39.86 39.5 39.86 39.51

## 2017-02-26 NOTE — Assessment & Plan Note (Signed)
Increased due to spouse's health , will address with him in the near fuutre

## 2017-02-26 NOTE — Patient Instructions (Addendum)
Physical exam wirth chronic pain management in 12 weeks, call if you need me before  Fasting lipid, cmp and EGFr 1 week before next visit  Congrats on continued smoking cessation  It is important that you exercise regularly at least 30 minutes 5 times a week. If you develop chest pain, have severe difficulty breathing, or feel very tired, stop exercising immediately and seek medical attention    Please work on good  health habits so that your health will improve. 1. Commitment to daily physical activity for 30 to 60  minutes, if you are able to do this.  2. Commitment to wise food choices. Aim for half of your  food intake to be vegetable and fruit, one quarter starchy foods, and one quarter protein. Try to eat on a regular schedule  3 meals per day, snacking between meals should be limited to vegetables or fruits or small portions of nuts. 64 ounces of water per day is generally recommended, unless you have specific health conditions, like heart failure or kidney failure where you will need to limit fluid intake.  3. Commitment to sufficient and a  good quality of physical and mental rest daily, generally between 6 to 8 hours per day.  WITH PERSISTANCE AND PERSEVERANCE, THE IMPOSSIBLE , BECOMES THE NORM!   Chest scan to be repeated in 12 month  Mammogram before next appointment as promised

## 2017-03-19 ENCOUNTER — Other Ambulatory Visit: Payer: Self-pay | Admitting: Family Medicine

## 2017-03-19 ENCOUNTER — Other Ambulatory Visit: Payer: Self-pay

## 2017-04-30 ENCOUNTER — Telehealth: Payer: Self-pay

## 2017-04-30 NOTE — Telephone Encounter (Signed)
Called pt to schedule Medicare Annual Wellness Visit. -nr  

## 2017-05-21 ENCOUNTER — Ambulatory Visit (INDEPENDENT_AMBULATORY_CARE_PROVIDER_SITE_OTHER): Payer: PPO | Admitting: Family Medicine

## 2017-05-21 ENCOUNTER — Encounter: Payer: Self-pay | Admitting: Family Medicine

## 2017-05-21 VITALS — BP 110/67 | HR 96 | Temp 98.8°F | Resp 16 | Ht 63.0 in | Wt 228.2 lb

## 2017-05-21 DIAGNOSIS — I10 Essential (primary) hypertension: Secondary | ICD-10-CM

## 2017-05-21 DIAGNOSIS — G8929 Other chronic pain: Secondary | ICD-10-CM

## 2017-05-21 DIAGNOSIS — Z23 Encounter for immunization: Secondary | ICD-10-CM | POA: Diagnosis not present

## 2017-05-21 DIAGNOSIS — M5430 Sciatica, unspecified side: Secondary | ICD-10-CM | POA: Diagnosis not present

## 2017-05-21 DIAGNOSIS — E785 Hyperlipidemia, unspecified: Secondary | ICD-10-CM | POA: Diagnosis not present

## 2017-05-21 DIAGNOSIS — Z1231 Encounter for screening mammogram for malignant neoplasm of breast: Secondary | ICD-10-CM

## 2017-05-21 DIAGNOSIS — R7301 Impaired fasting glucose: Secondary | ICD-10-CM

## 2017-05-21 DIAGNOSIS — F17210 Nicotine dependence, cigarettes, uncomplicated: Secondary | ICD-10-CM

## 2017-05-21 DIAGNOSIS — M549 Dorsalgia, unspecified: Secondary | ICD-10-CM

## 2017-05-21 MED ORDER — LISINOPRIL 10 MG PO TABS: 10.0000 mg | ORAL_TABLET | Freq: Every day | ORAL | 1 refills | Status: DC

## 2017-05-21 MED ORDER — FUROSEMIDE 20 MG PO TABS: 20.0000 mg | ORAL_TABLET | Freq: Two times a day (BID) | ORAL | 1 refills | Status: DC

## 2017-05-21 MED ORDER — HYDROCODONE-ACETAMINOPHEN 5-325 MG PO TABS: ORAL_TABLET | ORAL | 0 refills | Status: DC

## 2017-05-21 MED ORDER — ROSUVASTATIN CALCIUM 20 MG PO TABS: 20.0000 mg | ORAL_TABLET | Freq: Every day | ORAL | 5 refills | Status: DC

## 2017-05-21 MED ORDER — CELECOXIB 400 MG PO CAPS: 400.0000 mg | ORAL_CAPSULE | Freq: Every day | ORAL | 1 refills | Status: DC

## 2017-05-21 MED ORDER — FLUOXETINE HCL 20 MG PO CAPS: 20.0000 mg | ORAL_CAPSULE | Freq: Every day | ORAL | 1 refills | Status: DC

## 2017-05-21 NOTE — Patient Instructions (Addendum)
Physical exam the week of December  17 , call if you ned me before  Please get mammogram scheduled at checkout, 3 years overdue!!!!  Fasting lipid, cmp and EGFr , hBA1C and TSH this week please  Pain medication continues as before  Flu vaccine tday   STOP lisinopril, and reduce lasix to one daily   Nurse  bP check in 6 weeks  Need to QUIT smoking again, now at  3 per day   Steps to Quit Smoking Smoking tobacco can be bad for your health. It can also affect almost every organ in your body. Smoking puts you and people around you at risk for many serious long-lasting (chronic) diseases. Quitting smoking is hard, but it is one of the best things that you can do for your health. It is never too late to quit. What are the benefits of quitting smoking? When you quit smoking, you lower your risk for getting serious diseases and conditions. They can include:  Lung cancer or lung disease.  Heart disease.  Stroke.  Heart attack.  Not being able to have children (infertility).  Weak bones (osteoporosis) and broken bones (fractures).  If you have coughing, wheezing, and shortness of breath, those symptoms may get better when you quit. You may also get sick less often. If you are pregnant, quitting smoking can help to lower your chances of having a baby of low birth weight. What can I do to help me quit smoking? Talk with your doctor about what can help you quit smoking. Some things you can do (strategies) include:  Quitting smoking totally, instead of slowly cutting back how much you smoke over a period of time.  Going to in-person counseling. You are more likely to quit if you go to many counseling sessions.  Using resources and support systems, such as: ? Agricultural engineer with a Veterinary surgeon. ? Phone quitlines. ? Automotive engineer. ? Support groups or group counseling. ? Text messaging programs. ? Mobile phone apps or applications.  Taking medicines. Some of these  medicines may have nicotine in them. If you are pregnant or breastfeeding, do not take any medicines to quit smoking unless your doctor says it is okay. Talk with your doctor about counseling or other things that can help you.  Talk with your doctor about using more than one strategy at the same time, such as taking medicines while you are also going to in-person counseling. This can help make quitting easier. What things can I do to make it easier to quit? Quitting smoking might feel very hard at first, but there is a lot that you can do to make it easier. Take these steps:  Talk to your family and friends. Ask them to support and encourage you.  Call phone quitlines, reach out to support groups, or work with a Veterinary surgeon.  Ask people who smoke to not smoke around you.  Avoid places that make you want (trigger) to smoke, such as: ? Bars. ? Parties. ? Smoke-break areas at work.  Spend time with people who do not smoke.  Lower the stress in your life. Stress can make you want to smoke. Try these things to help your stress: ? Getting regular exercise. ? Deep-breathing exercises. ? Yoga. ? Meditating. ? Doing a body scan. To do this, close your eyes, focus on one area of your body at a time from head to toe, and notice which parts of your body are tense. Try to relax the muscles in those areas.  Download or buy apps on your mobile phone or tablet that can help you stick to your quit plan. There are many free apps, such as QuitGuide from the [employer] Office manager for Disease Control and Prevention). You can find more support from smokefree.gov and other websites.  This information is not intended to replace advice given to you by your health care provider. Make sure you discuss any questions you have with your health care provider. Document Released: 05/26/2009 Document Revised: 03/27/2016 Document Reviewed: 12/14/2014 Elsevier Interactive Patient Education  2018 [employer].

## 2017-05-22 ENCOUNTER — Encounter: Payer: Self-pay | Admitting: Family Medicine

## 2017-05-22 DIAGNOSIS — F17210 Nicotine dependence, cigarettes, uncomplicated: Secondary | ICD-10-CM | POA: Insufficient documentation

## 2017-05-22 NOTE — Progress Notes (Signed)
[name]     MRN: [id number]      DOB: April 11, 1946   HPI [name] is here for follow up and re-evaluation of chronic medical conditions, medication management and review of any available recent lab and radiology data.  Preventive health is updated, specifically  Cancer screening and Immunization.   Questions or concerns regarding consultations or procedures which the PT has had in the interim are  addressed. The PT denies any adverse reactions to current medications since the last visit.  Currently grieving loss of her sister after fighting cancer, nad has started smoking once more, down to 3 per day, plans to quit on her birhtday   ROS Denies recent fever or chills. Denies sinus pressure, nasal congestion, ear pain or sore throat. Denies chest congestion, productive cough or wheezing. Denies chest pains, palpitations and leg swelling Denies abdominal pain, nausea, vomiting,diarrhea or constipation.   Denies dysuria, frequency, hesitancy or incontinence. Denies uncontrolled  joint pain, swelling and limitation in mobility. Denies headaches, seizures, numbness, or tingling. Denies uncontrolled  depression, anxiety or insomnia. Denies skin break down or rash.   PE  BP 110/67   Pulse 96   Temp 98.8 F (37.1 C) (Other (Comment))   Resp 16   Ht 5\' 3"  (1.6 m)   Wt 228 lb 4 oz (103.5 kg)   SpO2 95%   BMI 40.43 kg/m   Patient alert and oriented and in no cardiopulmonary distress.  HEENT: No facial asymmetry, EOMI,   oropharynx pink and moist.  Neck supple no JVD, no mass.  Chest: Clear to auscultation bilaterally.Decreased though adequate   CVS: S1, S2 no murmurs, no S3.Regular rate.  ABD: Soft non tender.   Ext: No edema  MS: Adequate though reduced  ROM spine, normal in shoulders, hips and knees.  Skin: Intact, no ulcerations or rash noted.  Psych: Good eye contact, normal affect. Memory intact not anxious or depressed appearing.  CNS: CN 2-12 intact, power,   normal throughout.no focal deficits noted.   Assessment & Plan  Encounter for chronic pain management The patient's Controlled Substance registry is reviewed and compliance confirmed. Adequacy of  Pain control and level of function is assessed. Medication dosing is adjusted as deemed appropriate. Twelve weeks of medication is prescribed , patient signs for the script and is provided with a follow up appointment between 11 to 12 weeks .   Hyperlipidemia LDL goal <100 Updated lab needed at/ before next visit. Hyperlipidemia:Low fat diet discussed and encouraged.   Lipid Panel  Lab Results  Component Value Date   CHOL 100 08/20/2016   HDL 51 08/20/2016   LDLCALC 28 08/20/2016   LDLDIRECT 85 11/14/2007   TRIG 106 08/20/2016   CHOLHDL 2.0 08/20/2016       Morbid obesity Deteriorated. Patient re-educated about  the importance of commitment to a  minimum of 150 minutes of exercise per week.  The importance of healthy food choices with portion control discussed. Encouraged to start a food diary, count calories and to consider  joining a support group. Sample diet sheets offered. Goals set by the patient for the next several months.   Weight /BMI 05/21/2017 02/26/2017 12/04/2016  WEIGHT 228 lb 4 oz 232 lb 226 lb  HEIGHT 5\' 3"  5\' 3"  5\' 3"   BMI 40.43 kg/m2 41.1 kg/m2 40.03 kg/m2      Essential hypertension Controlled, no change in medication DASH diet and commitment to daily physical activity for a minimum of 30 minutes  discussed and encouraged, as a part of hypertension management. The importance of attaining a healthy weight is also discussed.  BP/Weight 05/21/2017 02/26/2017 01/14/2017 12/04/2016 08/23/2016 02/77/4128 02/17/6766  Systolic BP 209 470 962 836 629 476 546  Diastolic BP 67 74 80 76 72 74 60  Wt. (Lbs) 228.25 232 - 226 225 223 225  BMI 40.43 41.1 - 40.03 39.86 39.5 39.86       Back pain with sciatica Controlled, no change in medication   Episodic cigarette  smoking dependence Patient is asked and  confirms current  Nicotine use.  Five to seven minutes of time is spent in counseling the patient of the need to quit smoking  Advice to quit is delivered clearly specifically in reducing the risk of developing heart disease, having a stroke, or of developing all types of cancer, especially lung and oral cancer. Improvement in breathing and exercise tolerance and quality of life is also discussed, as is the economic benefit.  Assessment of willingness to quit or to make an attempt to quit is made and documented  Assistance in quit attempt is made with several and varied options presented, based on patient's desire and need. These include  literature, local classes available, 1800 QUIT NOW number, OTC and prescription medication.  The GOAL to be NICOTINE FREE is re emphasized.  The patient has set a personal goal of either reduction or discontinuation and follow up is arranged between 6 an 16 weeks.

## 2017-05-22 NOTE — Assessment & Plan Note (Signed)

## 2017-05-22 NOTE — Assessment & Plan Note (Signed)
The patient's Controlled Substance registry is reviewed and compliance confirmed. Adequacy of  Pain control and level of function is assessed. Medication dosing is adjusted as deemed appropriate. Twelve weeks of medication is prescribed , patient signs for the script and is provided with a follow up appointment between 11 to 12 weeks .  

## 2017-05-22 NOTE — Assessment & Plan Note (Signed)
Controlled, no change in medication DASH diet and commitment to daily physical activity for a minimum of 30 minutes discussed and encouraged, as a part of hypertension management. The importance of attaining a healthy weight is also discussed.  BP/Weight 05/21/2017 02/26/2017 01/14/2017 12/04/2016 08/23/2016 12/28/3356 09/17/1896  Systolic BP 421 031 281 188 677 373 668  Diastolic BP 67 74 80 76 72 74 60  Wt. (Lbs) 228.25 232 - 226 225 223 225  BMI 40.43 41.1 - 40.03 39.86 39.5 39.86

## 2017-05-22 NOTE — Assessment & Plan Note (Signed)
Deteriorated. Patient re-educated about  the importance of commitment to a  minimum of 150 minutes of exercise per week.  The importance of healthy food choices with portion control discussed. Encouraged to start a food diary, count calories and to consider  joining a support group. Sample diet sheets offered. Goals set by the patient for the next several months.   Weight /BMI 05/21/2017 02/26/2017 12/04/2016  WEIGHT 228 lb 4 oz 232 lb 226 lb  HEIGHT 5\' 3"  5\' 3"  5\' 3"   BMI 40.43 kg/m2 41.1 kg/m2 40.03 kg/m2

## 2017-05-22 NOTE — Assessment & Plan Note (Signed)
Controlled, no change in medication  

## 2017-05-22 NOTE — Assessment & Plan Note (Signed)
Updated lab needed at/ before next visit. Hyperlipidemia:Low fat diet discussed and encouraged.   Lipid Panel  Lab Results  Component Value Date   CHOL 100 08/20/2016   HDL 51 08/20/2016   LDLCALC 28 08/20/2016   LDLDIRECT 85 11/14/2007   TRIG 106 08/20/2016   CHOLHDL 2.0 08/20/2016

## 2017-05-23 ENCOUNTER — Ambulatory Visit (HOSPITAL_COMMUNITY): Payer: Self-pay

## 2017-05-24 ENCOUNTER — Telehealth: Payer: Self-pay

## 2017-05-24 ENCOUNTER — Other Ambulatory Visit: Payer: Self-pay | Admitting: Family Medicine

## 2017-05-24 MED ORDER — ALPRAZOLAM 1 MG PO TABS: 1.0000 mg | ORAL_TABLET | Freq: Two times a day (BID) | ORAL | 5 refills | Status: DC

## 2017-05-24 NOTE — Telephone Encounter (Signed)
Pt called and said she needs her zanax called into the pharmacy asap.  I told her we did not have clinic staff in the office today and I would send you a message but did not know if you would see it prior to Monday.

## 2017-05-24 NOTE — Progress Notes (Signed)
Xanax

## 2017-05-24 NOTE — Telephone Encounter (Signed)
I spoke with pt and script will be faxed

## 2017-05-27 NOTE — Telephone Encounter (Signed)
seen [phone]

## 2017-05-30 ENCOUNTER — Other Ambulatory Visit: Payer: Self-pay | Admitting: Family Medicine

## 2017-05-30 DIAGNOSIS — R7301 Impaired fasting glucose: Secondary | ICD-10-CM

## 2017-05-30 DIAGNOSIS — Z1231 Encounter for screening mammogram for malignant neoplasm of breast: Secondary | ICD-10-CM

## 2017-05-30 DIAGNOSIS — E785 Hyperlipidemia, unspecified: Secondary | ICD-10-CM

## 2017-05-30 DIAGNOSIS — I10 Essential (primary) hypertension: Secondary | ICD-10-CM

## 2017-06-13 ENCOUNTER — Other Ambulatory Visit: Payer: Self-pay

## 2017-06-13 ENCOUNTER — Telehealth: Payer: Self-pay | Admitting: *Deleted

## 2017-06-13 MED ORDER — TIZANIDINE HCL 4 MG PO TABS: 4.0000 mg | ORAL_TABLET | Freq: Three times a day (TID) | ORAL | 1 refills | Status: DC

## 2017-06-13 MED ORDER — DILTIAZEM HCL ER COATED BEADS 120 MG PO CP24: 120.0000 mg | ORAL_CAPSULE | Freq: Every day | ORAL | 1 refills | Status: DC

## 2017-06-13 MED ORDER — POTASSIUM CHLORIDE CRYS ER 20 MEQ PO TBCR: 20.0000 meq | EXTENDED_RELEASE_TABLET | Freq: Every day | ORAL | 1 refills | Status: DC

## 2017-06-13 MED ORDER — OMEPRAZOLE 40 MG PO CPDR: 40.0000 mg | DELAYED_RELEASE_CAPSULE | Freq: Every day | ORAL | 1 refills | Status: DC

## 2017-06-13 NOTE — Telephone Encounter (Signed)
Meds refilled.

## 2017-06-13 NOTE — Telephone Encounter (Signed)
Patient called stating the pharmacy still does not have her prescriptions. Please advise

## 2017-07-11 ENCOUNTER — Ambulatory Visit: Payer: Self-pay

## 2017-07-24 ENCOUNTER — Telehealth: Payer: Self-pay | Admitting: Family Medicine

## 2017-07-24 ENCOUNTER — Other Ambulatory Visit: Payer: Self-pay

## 2017-07-24 DIAGNOSIS — R7301 Impaired fasting glucose: Secondary | ICD-10-CM

## 2017-07-24 DIAGNOSIS — Z1231 Encounter for screening mammogram for malignant neoplasm of breast: Secondary | ICD-10-CM

## 2017-07-24 DIAGNOSIS — I10 Essential (primary) hypertension: Secondary | ICD-10-CM

## 2017-07-24 DIAGNOSIS — E785 Hyperlipidemia, unspecified: Secondary | ICD-10-CM

## 2017-07-24 MED ORDER — CELECOXIB 400 MG PO CAPS: 400.0000 mg | ORAL_CAPSULE | Freq: Every day | ORAL | 3 refills | Status: DC

## 2017-07-24 NOTE — Telephone Encounter (Signed)
Refill sent.

## 2017-07-24 NOTE — Telephone Encounter (Signed)
Patient is requesting a refill for Celebrex  [facility]  Cb#: [phone]

## 2017-08-12 ENCOUNTER — Other Ambulatory Visit: Payer: Self-pay

## 2017-08-12 ENCOUNTER — Ambulatory Visit (INDEPENDENT_AMBULATORY_CARE_PROVIDER_SITE_OTHER): Payer: PPO

## 2017-08-12 VITALS — BP 130/60 | HR 92 | Temp 97.2°F | Resp 16 | Ht 63.0 in | Wt 232.8 lb

## 2017-08-12 DIAGNOSIS — Z0000 Encounter for general adult medical examination without abnormal findings: Secondary | ICD-10-CM | POA: Diagnosis not present

## 2017-08-12 NOTE — Patient Instructions (Addendum)
[name] , Thank you for taking time to come for your Medicare Wellness Visit. I appreciate your ongoing commitment to your health goals. Please review the following plan we discussed and let me know if I can assist you in the future.   Screening recommendations/referrals: Colonoscopy: 2027 Mammogram: Ordered- overdue, please schedule at check-out Bone Density: Discuss with Dr. Moshe Cipro Recommended yearly ophthalmology/optometry visit for glaucoma screening and checkup Recommended yearly dental visit for hygiene and checkup  Vaccinations: Influenza vaccine: Done for this year Pneumococcal vaccine: 2022 Tdap vaccine: 2021 Shingles vaccine: Discuss updating to Shingrix with Dr. Moshe Cipro    Advanced directives: Information given in office today.  Conditions/risks identified: Fall risk  Next appointment: 08/14/17   Preventive Care 69 Years and Older, Female Preventive care refers to lifestyle choices and visits with your health care provider that can promote health and wellness. What does preventive care include?  A yearly physical exam. This is also called an annual well check.  Dental exams once or twice a year.  Routine eye exams. Ask your health care provider how often you should have your eyes checked.  Personal lifestyle choices, including:  Daily care of your teeth and gums.  Regular physical activity.  Eating a healthy diet.  Avoiding tobacco and drug use.  Limiting alcohol use.  Practicing safe sex.  Taking low-dose aspirin every day.  Taking vitamin and mineral supplements as recommended by your health care provider. What happens during an annual well check? The services and screenings done by your health care provider during your annual well check will depend on your age, overall health, lifestyle risk factors, and family history of disease. Counseling  Your health care provider may ask you questions about your:  Alcohol use.  Tobacco use.  Drug  use.  Emotional well-being.  Home and relationship well-being.  Sexual activity.  Eating habits.  History of falls.  Memory and ability to understand (cognition).  Work and work Statistician.  Reproductive health. Screening  You may have the following tests or measurements:  Height, weight, and BMI.  Blood pressure.  Lipid and cholesterol levels. These may be checked every 5 years, or more frequently if you are over 4 years old.  Skin check.  Lung cancer screening. You may have this screening every year starting at age 34 if you have a 30-pack-year history of smoking and currently smoke or have quit within the past 15 years.  Fecal occult blood test (FOBT) of the stool. You may have this test every year starting at age 65.  Flexible sigmoidoscopy or colonoscopy. You may have a sigmoidoscopy every 5 years or a colonoscopy every 10 years starting at age 50.  Hepatitis C blood test.  Hepatitis B blood test.  Sexually transmitted disease (STD) testing.  Diabetes screening. This is done by checking your blood sugar (glucose) after you have not eaten for a while (fasting). You may have this done every 1-3 years.  Bone density scan. This is done to screen for osteoporosis. You may have this done starting at age 1.  Mammogram. This may be done every 1-2 years. Talk to your health care provider about how often you should have regular mammograms. Talk with your health care provider about your test results, treatment options, and if necessary, the need for more tests. Vaccines  Your health care provider may recommend certain vaccines, such as:  Influenza vaccine. This is recommended every year.  Tetanus, diphtheria, and acellular pertussis (Tdap, Td) vaccine. You may need a  Td booster every 10 years.  Zoster vaccine. You may need this after age 35.  Pneumococcal 13-valent conjugate (PCV13) vaccine. One dose is recommended after age 49.  Pneumococcal polysaccharide  (PPSV23) vaccine. One dose is recommended after age 66. Talk to your health care provider about which screenings and vaccines you need and how often you need them. This information is not intended to replace advice given to you by your health care provider. Make sure you discuss any questions you have with your health care provider. Document Released: 08/26/2015 Document Revised: 04/18/2016 Document Reviewed: 05/31/2015 Elsevier Interactive Patient Education  2017 [place] Prevention in the Home Falls can cause injuries. They can happen to people of all ages. There are many things you can do to make your home safe and to help prevent falls. What can I do on the outside of my home?  Regularly fix the edges of walkways and driveways and fix any cracks.  Remove anything that might make you trip as you walk through a door, such as a raised step or threshold.  Trim any bushes or trees on the path to your home.  Use bright outdoor lighting.  Clear any walking paths of anything that might make someone trip, such as rocks or tools.  Regularly check to see if handrails are loose or broken. Make sure that both sides of any steps have handrails.  Any raised decks and porches should have guardrails on the edges.  Have any leaves, snow, or ice cleared regularly.  Use sand or salt on walking paths during winter.  Clean up any spills in your garage right away. This includes oil or grease spills. What can I do in the bathroom?  Use night lights.  Install grab bars by the toilet and in the tub and shower. Do not use towel bars as grab bars.  Use non-skid mats or decals in the tub or shower.  If you need to sit down in the shower, use a plastic, non-slip stool.  Keep the floor dry. Clean up any water that spills on the floor as soon as it happens.  Remove soap buildup in the tub or shower regularly.  Attach bath mats securely with double-sided non-slip rug tape.  Do not have  throw rugs and other things on the floor that can make you trip. What can I do in the bedroom?  Use night lights.  Make sure that you have a light by your bed that is easy to reach.  Do not use any sheets or blankets that are too big for your bed. They should not hang down onto the floor.  Have a firm chair that has side arms. You can use this for support while you get dressed.  Do not have throw rugs and other things on the floor that can make you trip. What can I do in the kitchen?  Clean up any spills right away.  Avoid walking on wet floors.  Keep items that you use a lot in easy-to-reach places.  If you need to reach something above you, use a strong step stool that has a grab bar.  Keep electrical cords out of the way.  Do not use floor polish or wax that makes floors slippery. If you must use wax, use non-skid floor wax.  Do not have throw rugs and other things on the floor that can make you trip. What can I do with my stairs?  Do not leave any items on the stairs.  Make sure that there are handrails on both sides of the stairs and use them. Fix handrails that are broken or loose. Make sure that handrails are as long as the stairways.  Check any carpeting to make sure that it is firmly attached to the stairs. Fix any carpet that is loose or worn.  Avoid having throw rugs at the top or bottom of the stairs. If you do have throw rugs, attach them to the floor with carpet tape.  Make sure that you have a light switch at the top of the stairs and the bottom of the stairs. If you do not have them, ask someone to add them for you. What else can I do to help prevent falls?  Wear shoes that:  Do not have high heels.  Have rubber bottoms.  Are comfortable and fit you well.  Are closed at the toe. Do not wear sandals.  If you use a stepladder:  Make sure that it is fully opened. Do not climb a closed stepladder.  Make sure that both sides of the stepladder are  locked into place.  Ask someone to hold it for you, if possible.  Clearly mark and make sure that you can see:  Any grab bars or handrails.  First and last steps.  Where the edge of each step is.  Use tools that help you move around (mobility aids) if they are needed. These include:  Canes.  Walkers.  Scooters.  Crutches.  Turn on the lights when you go into a dark area. Replace any light bulbs as soon as they burn out.  Set up your furniture so you have a clear path. Avoid moving your furniture around.  If any of your floors are uneven, fix them.  If there are any pets around you, be aware of where they are.  Review your medicines with your doctor. Some medicines can make you feel dizzy. This can increase your chance of falling. Ask your doctor what other things that you can do to help prevent falls. This information is not intended to replace advice given to you by your health care provider. Make sure you discuss any questions you have with your health care provider. Document Released: 05/26/2009 Document Revised: 01/05/2016 Document Reviewed: 09/03/2014 Elsevier Interactive Patient Education  2017 [employer].

## 2017-08-12 NOTE — Progress Notes (Signed)
Subjective:   [name] is a 71 y.o. female who presents for Medicare Annual (Subsequent) preventive examination.  Review of Systems:   Cardiac Risk Factors include: advanced age (>57men, >75 women);hypertension;dyslipidemia;obesity (BMI >30kg/m2);sedentary lifestyle;smoking/ tobacco exposure     Objective:     Vitals: BP 130/60 (BP Location: Left Arm, Patient Position: Sitting, Cuff Size: Normal)   Pulse 92   Temp (!) 97.2 F (36.2 C) (Other (Comment))   Resp 16   Ht 5\' 3"  (1.6 m)   Wt 232 lb 12 oz (105.6 kg)   SpO2 95%   BMI 41.23 kg/m   Body mass index is 41.23 kg/m.  Advanced Directives 08/12/2017 08/12/2017 02/02/2016  Does Patient Have a Medical Advance Directive? No No Yes  Type of Advance Directive - - [place] in Chart? - - No - copy requested  Would patient like information on creating a medical advance directive? Yes (MAU/Ambulatory/Procedural Areas - Information given) Yes (MAU/Ambulatory/Procedural Areas - Information given) -    Tobacco Social History   Tobacco Use  Smoking Status Former Smoker  . Packs/day: 0.25  . Types: Cigarettes  Smokeless Tobacco Never Used  Tobacco Comment   quit after recent COPD flare      Counseling given: Not Answered Comment: quit after recent COPD flare    Clinical Intake:  Pre-visit preparation completed: Yes  Pain : No/denies pain Pain Score: 0-No pain     Diabetes: No  How often do you need to have someone help you when you read instructions, pamphlets, or other written materials from your doctor or pharmacy?: 1 - Never  Interpreter Needed?: No     Past Medical History:  Diagnosis Date  . Anxiety   . Arthritis   . Chronic back pain   . 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 GERD (gastroesophageal reflux disease) 2000  . Hyperlipidemia 1995  . Hypertension 1995  . Nicotine addiction   . Obesity     Past Surgical History:  Procedure Laterality Date  . BACK SURGERY  1999  . CHOLECYSTECTOMY  2001  . COLONOSCOPY N/A 02/02/2016   Procedure: COLONOSCOPY;  Surgeon: [name], MD;  Location: [facility];  Service: Endoscopy;  Laterality: N/A;  930  . INCISIONAL HERNIA REPAIR  August 17, 2008  . left inguinal hernia herniorrhapy  1980  . removal of thmoma  03/27/2010   Dr. Arlyce Dice   . SPINE SURGERY    . TONSILLECTOMY    . TOTAL ABDOMINAL HYSTERECTOMY W/ BILATERAL SALPINGOOPHORECTOMY  1984   Family History  Problem Relation Age of Onset  . Heart disease Mother        enlarged  . Diabetes Mother   . Hypertension Mother   . Cancer Father        throat  . Hypertension Father   . Coronary artery disease Father   . Diabetes Sister        x3  . Asthma Sister   . Kidney disease Brother   . Stroke Brother    Social History   Socioeconomic History  . Marital status: Married    Spouse name: None  . Number of children: None  . Years of education: None  . Highest education level: None  Social Needs  . Financial resource strain: Not hard at all  . Food insecurity - worry: Never true  . Food insecurity -  inability: Never true  . Transportation needs - medical: No  . Transportation needs - non-medical: No  Occupational History  . Occupation: employed    Comment: still working- owns [employer]  Tobacco Use  . Smoking status: Former Smoker    Packs/day: 0.25    Types: Cigarettes  . Smokeless tobacco: Never Used  . Tobacco comment: quit after recent COPD flare   Substance and Sexual Activity  . Alcohol use: No    Alcohol/week: 0.0 oz  . Drug use: No  . Sexual activity: Yes  Other Topics Concern  . None  Social History Narrative   Pt had 2 stillborns     Outpatient Encounter Medications as of 08/12/2017  Medication Sig  . albuterol (PROAIR HFA) 108 (90 Base) MCG/ACT inhaler UWSE 2 PUFFS EVERY 6 HOURS AS NEEDED FOR SHORTNESS OF BREATH.  Marland Kitchen albuterol  (PROVENTIL) (2.5 MG/3ML) 0.083% nebulizer solution INHALE ONE VIAL VIA NEBULIZER EVERY 6 HOURS AS NEEDED.  Marland Kitchen ALPRAZolam (XANAX) 1 MG tablet take 1 tablet by mouth twice a day  . aspirin (ASPIRIN LOW DOSE) 81 MG EC tablet Take 81 mg by mouth daily.    . budesonide-formoterol (SYMBICORT) 80-4.5 MCG/ACT inhaler Inhale 2 puffs into the lungs 2 (two) times daily.  . celecoxib (CELEBREX) 400 MG capsule Take 1 capsule (400 mg total) by mouth daily.  Marland Kitchen diltiazem (CARDIZEM CD) 120 MG 24 hr capsule Take 1 capsule (120 mg total) by mouth daily.  Marland Kitchen FLUoxetine (PROZAC) 20 MG capsule Take 1 capsule (20 mg total) by mouth daily.  . furosemide (LASIX) 20 MG tablet Take 1 tablet (20 mg total) by mouth 2 (two) times daily.  Marland Kitchen HYDROcodone-acetaminophen (NORCO/VICODIN) 5-325 MG tablet TAKE ONE TABLET THREE TIMES A DAY  . HYDROcodone-acetaminophen (NORCO/VICODIN) 5-325 MG tablet TAKE ONE TABLET THREE TIMES A DAY  . HYDROcodone-acetaminophen (NORCO/VICODIN) 5-325 MG tablet TAKE ONE TABLET THREE TIMES A DAY  . ipratropium-albuterol (DUONEB) 0.5-2.5 (3) MG/3ML SOLN Take 3 mLs by nebulization every 6 (six) hours as needed.  Marland Kitchen omeprazole (PRILOSEC) 40 MG capsule Take 1 capsule (40 mg total) by mouth daily.  . potassium chloride SA (K-DUR,KLOR-CON) 20 MEQ tablet Take 1 tablet (20 mEq total) by mouth daily.  . rosuvastatin (CRESTOR) 20 MG tablet take 1 tablet by mouth once daily  . tiZANidine (ZANAFLEX) 4 MG tablet Take 1 tablet (4 mg total) by mouth 3 (three) times daily.  . [DISCONTINUED] celecoxib (CELEBREX) 400 MG capsule Take 1 capsule (400 mg total) by mouth daily.  . [DISCONTINUED] ALPRAZolam (XANAX) 1 MG tablet Take 1 tablet (1 mg total) by mouth 2 (two) times daily.   No facility-administered encounter medications on file as of 08/12/2017.     Activities of Daily Living In your present state of health, do you have any difficulty performing the following activities: 08/12/2017  Hearing? N  Vision? N    Difficulty concentrating or making decisions? N  Walking or climbing stairs? Y  Dressing or bathing? N  Doing errands, shopping? N  Preparing Food and eating ? N  Using the Toilet? N  In the past six months, have you accidently leaked urine? N  Do you have problems with loss of bowel control? N  Managing your Medications? N  Managing your Finances? N  Housekeeping or managing your Housekeeping? N  Some recent data might be hidden    Patient Care Team: Fayrene Helper, MD as PCP - General    Assessment:   This is a  routine wellness examination for [place].  Exercise Activities and Dietary recommendations Current Exercise Habits: The patient does not participate in regular exercise at present, Exercise limited by: orthopedic condition(s)  Goals    None      Fall Risk Fall Risk  08/12/2017 02/26/2017 12/04/2016 10/14/2015 05/31/2014  Falls in the past year? No No No No Yes  Number falls in past yr: - - - - 1  Injury with Fall? - - - - No   Is the patient's home free of loose throw rugs in walkways, pet beds, electrical cords, etc?   no      Grab bars in the bathroom? no      Handrails on the stairs?   Patient does not have stairs      Adequate lighting?   yes   Depression Screen PHQ 2/9 Scores 08/12/2017 02/26/2017 02/08/2016 05/31/2014  PHQ - 2 Score 0 0 2 0  PHQ- 9 Score - - 7 -     Cognitive Function        Immunization History  Administered Date(s) Administered  . Influenza Split 07/17/2011, 04/28/2012  . Influenza Whole 05/24/2010  . Influenza,inj,Quad PF,6+ Mos 04/22/2013, 05/31/2014, 04/12/2015, 07/10/2016, 05/21/2017  . Pneumococcal Conjugate-13 03/01/2014  . Pneumococcal Polysaccharide-23 10/14/2015  . Td 05/24/2010  . Zoster 10/03/2010    Qualifies for Shingles Vaccine? Discussed with patient. She has had Zostavax. Will discuss with PCP need for Shingrix.  Screening Tests Health Maintenance  Topic Date Due  . MAMMOGRAM  11/10/2015  . DEXA  SCAN  01/05/2018 (Originally 06/02/2013)  . TETANUS/TDAP  05/24/2020  . COLONOSCOPY  02/01/2021  . INFLUENZA VACCINE  Completed  . Hepatitis C Screening  Completed  . PNA vac Low Risk Adult  Completed    Cancer Screenings: Lung: Low Dose CT Chest recommended if Age 60-80 years, 30 pack-year currently smoking OR have quit w/in 15years. Patient does not qualify. Breast:  Up to date on Mammogram? No   Up to date of Bone Density/Dexa? No Colorectal: Had had colonoscopy. Not due for repeat until 2027.  Additional Screenings:  Hepatitis B/HIV/Syphillis: Will discuss at follow-up with PCP Hepatitis C Screening: Will discuss at follow-up with PCP     Plan:      I have personally reviewed and noted the following in the patient's chart:   . Medical and social history . Use of alcohol, tobacco or illicit drugs  . Current medications and supplements . Functional ability and status . Nutritional status . Physical activity . Advanced directives . List of other physicians . Hospitalizations, surgeries, and ER visits in previous 12 months . Vitals . Screenings to include cognitive, depression, and falls . Referrals and appointments  In addition, I have reviewed and discussed with patient certain preventive protocols, quality metrics, and best practice recommendations. A written personalized care plan for preventive services as well as general preventive health recommendations were provided to patient.     Merceda Elks, LPN  00/34/9179

## 2017-08-14 ENCOUNTER — Ambulatory Visit (INDEPENDENT_AMBULATORY_CARE_PROVIDER_SITE_OTHER): Payer: PPO | Admitting: Family Medicine

## 2017-08-14 ENCOUNTER — Encounter: Payer: Self-pay | Admitting: Family Medicine

## 2017-08-14 VITALS — BP 110/64 | HR 102 | Resp 16 | Ht 63.0 in | Wt 234.0 lb

## 2017-08-14 DIAGNOSIS — E785 Hyperlipidemia, unspecified: Secondary | ICD-10-CM | POA: Diagnosis not present

## 2017-08-14 DIAGNOSIS — I110 Hypertensive heart disease with heart failure: Secondary | ICD-10-CM | POA: Diagnosis not present

## 2017-08-14 DIAGNOSIS — I10 Essential (primary) hypertension: Secondary | ICD-10-CM | POA: Diagnosis not present

## 2017-08-14 DIAGNOSIS — J439 Emphysema, unspecified: Secondary | ICD-10-CM

## 2017-08-14 DIAGNOSIS — F17210 Nicotine dependence, cigarettes, uncomplicated: Secondary | ICD-10-CM | POA: Diagnosis not present

## 2017-08-14 DIAGNOSIS — G8929 Other chronic pain: Secondary | ICD-10-CM | POA: Diagnosis not present

## 2017-08-14 DIAGNOSIS — R7301 Impaired fasting glucose: Secondary | ICD-10-CM | POA: Diagnosis not present

## 2017-08-14 DIAGNOSIS — F411 Generalized anxiety disorder: Secondary | ICD-10-CM

## 2017-08-14 DIAGNOSIS — Z1231 Encounter for screening mammogram for malignant neoplasm of breast: Secondary | ICD-10-CM

## 2017-08-14 MED ORDER — HYDROCODONE-ACETAMINOPHEN 5-325 MG PO TABS: ORAL_TABLET | ORAL | 0 refills | Status: DC

## 2017-08-14 MED ORDER — LISINOPRIL 10 MG PO TABS: 10.0000 mg | ORAL_TABLET | Freq: Every day | ORAL | 3 refills | Status: DC

## 2017-08-14 NOTE — Progress Notes (Signed)
[name]     MRN: [id number]      DOB: 1946/04/03   HPI [name] is here for follow up and re-evaluation of chronic medical conditions, medication management, in particular chronic pain management ,   Preventive health is updated, specifically  Cancer screening overdue is her mammogram, and Immunization.  Is UTD Questions or concerns regarding consultations or procedures which the PT has had in the interim are  addressed. The PT states crestor 20 mg causes GI upset when 40 mg does not, needs lab to determine dose C/o heart failure and increased SOb which is multifactorial, needs cardiology eval ROS Denies recent fever or chills. Denies sinus pressure, nasal congestion, ear pain or sore throat. Denies chest congestion, productive cough or wheezing. Denies chest pains, palpitations and leg swelling Denies abdominal pain, nausea, vomiting,diarrhea or constipation.   Denies dysuria, frequency, hesitancy or incontinence. Denies uncontrolled  joint pain, swelling and limitation in mobility. Denies headaches, seizures, numbness, or tingling. Denies depression, anxiety or insomnia.c/o acute grief recently lost her blither 08/07/2017, now veey focused on her own mortality and doing what she needs to do to get healthy and to live Denies skin break down or rash.   PE  BP 110/64   Pulse (!) 102   Resp 16   Ht 5\' 3"  (1.6 m)   Wt 234 lb (106.1 kg)   SpO2 91%   BMI 41.45 kg/m   Patient alert and oriented and in no cardiopulmonary distress.  HEENT: No facial asymmetry, EOMI,   oropharynx pink and moist.  Neck supple no JVD, no mass.  Chest: Clear to auscultation bilaterally.Decfreased air entry CVS: S1, S2 no murmurs, no S3.Regular rate.  ABD: Soft non tender.   Ext: No edema  MS: Adequate though reduced  ROM spine, shoulders, hips and knees.  Skin: Intact, no ulcerations or rash noted.  Psych: Good eye contact, normal affect. Memory intact mildly  anxious or depressed  appearing.  CNS: CN 2-12 intact, power,  normal throughout.no focal deficits noted.   Assessment & Plan  Encounter for chronic pain management The patient's Controlled Substance registry is reviewed and compliance confirmed. Adequacy of  Pain control and level of function is assessed. Medication dosing is adjusted as deemed appropriate. Twelve weeks of medication is prescribed , patient signs for the script and is provided with a follow up appointment between 11 to 12 weeks .   Essential hypertension Controlled, no change in medication DASH diet and commitment to daily physical activity for a minimum of 30 minutes discussed and encouraged, as a part of hypertension management. The importance of attaining a healthy weight is also discussed.  BP/Weight 08/14/2017 08/12/2017 05/21/2017 02/26/2017 01/14/2017 12/04/2016 0/86/5784  Systolic BP 696 295 284 132 440 102 725  Diastolic BP 64 60 67 74 80 76 72  Wt. (Lbs) 234 232.75 228.25 232 - 226 225  BMI 41.45 41.23 40.43 41.1 - 40.03 39.86       Morbid obesity Deteriorated. Patient re-educated about  the importance of commitment to a  minimum of 150 minutes of exercise per week.  The importance of healthy food choices with portion control discussed. Encouraged to start a food diary, count calories and to consider  joining a support group. Sample diet sheets offered. Goals set by the patient for the next several months.   Weight /BMI 08/14/2017 08/12/2017 05/21/2017  WEIGHT 234 lb 232 lb 12 oz 228 lb 4 oz  HEIGHT 5\' 3"  5\' 3"  5'  3"  BMI 41.45 kg/m2 41.23 kg/m2 40.43 kg/m2      Episodic cigarette smoking dependence Patient is asked and  confirms current  Nicotine use.  Five to seven minutes of time is spent in counseling the patient of the need to quit smoking  Advice to quit is delivered clearly specifically in reducing the risk of developing heart disease, having a stroke, or of developing all types of cancer, especially lung and oral  cancer. Improvement in breathing and exercise tolerance and quality of life is also discussed, as is the economic benefit.  Assessment of willingness to quit or to make an attempt to quit is made and documented  Assistance in quit attempt is made with several and varied options presented, based on patient's desire and need. These include  literature, local classes available, 1800 QUIT NOW number, OTC and prescription medication.  The GOAL to be NICOTINE FREE is re emphasized.  The patient has set a personal goal of either reduction or discontinuation and follow up is arranged between 6 an 16 weeks.    Anxiety state Increase with loss of 2 siblings within 6 months of each other  Hypertensive heart disease with heart failure ([place]) Hypoertension for over 15 years, reports being diagnosed in the past with heart failure, record of echo unavailable, needs updated echo , she reports increased fatigue and poor exercise tolerance in the past 4 to 6 months. Needs to establish with a new cardiologist multiple risk factors for CAD, hypertension, hyperlipidemia, nicotine use and  IGT   Emphysema lung ([place]) Worsening due to ongoing nicotine use , counsel led for over 5 mins on the need to quit , states she is motivated to quitting

## 2017-08-14 NOTE — Patient Instructions (Addendum)
Annual physical exam end January or early February  Quit date for cigarette is January 6, you a want to live and be healthy  You will get mammogram appt scheduled today at [facility] center, wait at checkout for appt info   Resume lisinopril one daily  Stop lemonade , only water  No candy , or cake no sweets except fruit  You are being referred to cardiology in [place]  Fasting blood work tomorrow, drink water, labs to be added to order are CBC, vit D

## 2017-08-15 DIAGNOSIS — Z1231 Encounter for screening mammogram for malignant neoplasm of breast: Secondary | ICD-10-CM | POA: Diagnosis not present

## 2017-08-15 DIAGNOSIS — I10 Essential (primary) hypertension: Secondary | ICD-10-CM | POA: Diagnosis not present

## 2017-08-15 DIAGNOSIS — E785 Hyperlipidemia, unspecified: Secondary | ICD-10-CM | POA: Diagnosis not present

## 2017-08-15 DIAGNOSIS — R7301 Impaired fasting glucose: Secondary | ICD-10-CM | POA: Diagnosis not present

## 2017-08-16 ENCOUNTER — Encounter: Payer: Self-pay | Admitting: Family Medicine

## 2017-08-16 LAB — HEMOGLOBIN A1C
HEMOGLOBIN A1C: 6.1 %{Hb} — AB (ref <5.7)
Mean Plasma Glucose: 128 (calc)
eAG (mmol/L): 7.1 (calc)

## 2017-08-16 LAB — COMPLETE METABOLIC PANEL WITH GFR
AG Ratio: 1.5 (calc) (ref 1.0–2.5)
ALT: 23 U/L (ref 6–29)
AST: 19 U/L (ref 10–35)
Albumin: 3.7 g/dL (ref 3.6–5.1)
Alkaline phosphatase (APISO): 81 U/L (ref 33–130)
BUN: 18 mg/dL (ref 7–25)
CALCIUM: 8.7 mg/dL (ref 8.6–10.4)
CO2: 30 mmol/L (ref 20–32)
CREATININE: 0.78 mg/dL (ref 0.50–0.99)
Chloride: 103 mmol/L (ref 98–110)
GFR, EST AFRICAN AMERICAN: 90 mL/min/{1.73_m2} (ref >=60)
GFR, EST NON AFRICAN AMERICAN: 78 mL/min/{1.73_m2} (ref >=60)
GLUCOSE: 102 mg/dL — AB (ref 65–99)
Globulin: 2.5 g/dL (calc) (ref 1.9–3.7)
Potassium: 4.3 mmol/L (ref 3.5–5.3)
Sodium: 139 mmol/L (ref 135–146)
TOTAL PROTEIN: 6.2 g/dL (ref 6.1–8.1)
Total Bilirubin: 0.2 mg/dL (ref 0.2–1.2)

## 2017-08-16 LAB — LIPID PANEL
CHOL/HDL RATIO: 2.4 (calc) (ref <5.0)
Cholesterol: 102 mg/dL (ref <200)
HDL: 42 mg/dL — ABNORMAL LOW (ref >50)
LDL Cholesterol (Calc): 40 mg/dL (calc)
NON-HDL CHOLESTEROL (CALC): 60 mg/dL (ref <130)
Triglycerides: 113 mg/dL (ref <150)

## 2017-08-16 LAB — TSH: TSH: 0.82 mIU/L (ref 0.40–4.50)

## 2017-08-19 ENCOUNTER — Encounter: Payer: Self-pay | Admitting: Family Medicine

## 2017-08-19 DIAGNOSIS — I110 Hypertensive heart disease with heart failure: Secondary | ICD-10-CM | POA: Insufficient documentation

## 2017-08-19 NOTE — Assessment & Plan Note (Signed)
Increase with loss of 2 siblings within 6 months of each other

## 2017-08-19 NOTE — Assessment & Plan Note (Signed)

## 2017-08-19 NOTE — Assessment & Plan Note (Signed)
The patient's Controlled Substance registry is reviewed and compliance confirmed. Adequacy of  Pain control and level of function is assessed. Medication dosing is adjusted as deemed appropriate. Twelve weeks of medication is prescribed , patient signs for the script and is provided with a follow up appointment between 11 to 12 weeks .  

## 2017-08-19 NOTE — Assessment & Plan Note (Signed)
Worsening due to ongoing nicotine use , counsel led for over 5 mins on the need to quit , states she is motivated to quitting

## 2017-08-19 NOTE — Assessment & Plan Note (Addendum)
Hypoertension for over 15 years, reports being diagnosed in the past with heart failure, record of echo unavailable, needs updated echo , she reports increased fatigue and poor exercise tolerance in the past 4 to 6 months. Needs to establish with a new cardiologist multiple risk factors for CAD, hypertension, hyperlipidemia, nicotine use and  IGT

## 2017-08-19 NOTE — Assessment & Plan Note (Signed)
Controlled, no change in medication DASH diet and commitment to daily physical activity for a minimum of 30 minutes discussed and encouraged, as a part of hypertension management. The importance of attaining a healthy weight is also discussed.  BP/Weight 08/14/2017 08/12/2017 05/21/2017 02/26/2017 01/14/2017 12/04/2016 2/63/3354  Systolic BP 562 563 893 734 287 681 157  Diastolic BP 64 60 67 74 80 76 72  Wt. (Lbs) 234 232.75 228.25 232 - 226 225  BMI 41.45 41.23 40.43 41.1 - 40.03 39.86

## 2017-08-19 NOTE — Assessment & Plan Note (Signed)
Deteriorated. Patient re-educated about  the importance of commitment to a  minimum of 150 minutes of exercise per week.  The importance of healthy food choices with portion control discussed. Encouraged to start a food diary, count calories and to consider  joining a support group. Sample diet sheets offered. Goals set by the patient for the next several months.   Weight /BMI 08/14/2017 08/12/2017 05/21/2017  WEIGHT 234 lb 232 lb 12 oz 228 lb 4 oz  HEIGHT 5\' 3"  5\' 3"  5\' 3"   BMI 41.45 kg/m2 41.23 kg/m2 40.43 kg/m2

## 2017-09-06 ENCOUNTER — Ambulatory Visit: Payer: Self-pay

## 2017-09-26 ENCOUNTER — Ambulatory Visit: Payer: Self-pay | Admitting: Cardiovascular Disease

## 2017-10-02 ENCOUNTER — Encounter: Payer: Self-pay | Admitting: Family Medicine

## 2017-10-02 ENCOUNTER — Telehealth: Payer: Self-pay | Admitting: Family Medicine

## 2017-10-02 ENCOUNTER — Other Ambulatory Visit: Payer: Self-pay

## 2017-10-02 ENCOUNTER — Ambulatory Visit (INDEPENDENT_AMBULATORY_CARE_PROVIDER_SITE_OTHER): Payer: PPO | Admitting: Family Medicine

## 2017-10-02 VITALS — BP 162/68 | HR 100 | Temp 98.6°F | Resp 24 | Ht 63.0 in | Wt 226.1 lb

## 2017-10-02 DIAGNOSIS — J441 Chronic obstructive pulmonary disease with (acute) exacerbation: Secondary | ICD-10-CM

## 2017-10-02 MED ORDER — DOXYCYCLINE HYCLATE 100 MG PO TABS: 100.0000 mg | ORAL_TABLET | Freq: Two times a day (BID) | ORAL | 0 refills | Status: DC

## 2017-10-02 MED ORDER — IPRATROPIUM BROMIDE 0.02 % IN SOLN
0.5000 mg | Freq: Once | RESPIRATORY_TRACT | Status: AC
  Administered 2017-10-02: 0.5 mg via RESPIRATORY_TRACT

## 2017-10-02 MED ORDER — PREDNISONE 20 MG PO TABS: 20.0000 mg | ORAL_TABLET | Freq: Two times a day (BID) | ORAL | 0 refills | Status: DC

## 2017-10-02 MED ORDER — BENZONATATE 200 MG PO CAPS: 200.0000 mg | ORAL_CAPSULE | Freq: Two times a day (BID) | ORAL | 0 refills | Status: DC | PRN

## 2017-10-02 MED ORDER — ALBUTEROL SULFATE (2.5 MG/3ML) 0.083% IN NEBU
2.5000 mg | INHALATION_SOLUTION | Freq: Once | RESPIRATORY_TRACT | Status: AC
  Administered 2017-10-02: 2.5 mg via RESPIRATORY_TRACT

## 2017-10-02 MED ORDER — METHYLPREDNISOLONE ACETATE 80 MG/ML IJ SUSP
80.0000 mg | Freq: Once | INTRAMUSCULAR | Status: AC
  Administered 2017-10-02: 80 mg via INTRAMUSCULAR

## 2017-10-02 NOTE — Telephone Encounter (Signed)
Patient spoke to [facility] who advised her to come in for appointment.

## 2017-10-02 NOTE — Patient Instructions (Signed)
Make sure you drink plenty fluids Take the Tessalon as needed for the coughing  take the antibiotic twice a day with food. Take the prednisone twice a day until finished Stay on your usual inhalers Call if not better by Monday  Congratulations on your efforts to quit smoking  See Dr. Moshe Cipro for usual follow-up

## 2017-10-02 NOTE — Telephone Encounter (Signed)
Patient left message on nurse line stating that she has a fever and coughing and just does not feel good. She states that she has been told by Dr. Moshe Cipro that when she has these symptoms she is not to come in the office. She is requesting a z-pak.   Callback# [phone]  Since there are no standing orders for this, she will need to be evaluated in office. Unable to reach patient

## 2017-10-02 NOTE — Progress Notes (Signed)
Chief Complaint  Patient presents with  . URI    x 1 week  Patient is here for an upper respiratory infection.  She started off with a cold and sore throat a week ago.  Over the last week she has been progressively more short of breath, has had a lot of coughing, and increase in sputum.  She has known COPD with exacerbations frequently.  She is trying to quit smoking.  She states she has been feeling achy and tired.  Unknown fever or chills.  The sore throat and cold symptoms are better.  No chest pain.  Her chest is tight and wheezy.  She is coughing up thick sputum that she largely tan to yellow in color.  No blood in the sputum.  She states that she is using her usual inhalers but has not gotten out her nebulizer.  She has not used any inhalers today.  She is acutely short of breath and using accessory muscles upon arrival.  She is brought in by her husband.   Patient Active Problem List   Diagnosis Date Noted  . Hypertensive heart disease with heart failure ([place]) 08/19/2017  . Episodic cigarette smoking dependence 05/22/2017  . Emphysema lung ([place]) 02/26/2017  . Lung nodule, solitary 02/26/2017  . Encounter for chronic pain management 08/26/2016  . Anemia, deficiency 07/27/2016  . H/O nicotine dependence 12/05/2014  . Depression 12/31/2013  . Female bladder prolapse 03/10/2013  . Impaired fasting glucose 10/03/2010  . Vitamin D deficiency 09/08/2009  . Back pain with sciatica 04/04/2009  . Essential hypertension 06/09/2008  . ABDOMINAL WALL HERNIA 06/09/2008  . Hyperlipidemia LDL goal <100 11/14/2007  . Morbid obesity ([place]) 11/14/2007  . Anxiety state 11/14/2007  . GERD 11/14/2007    Outpatient Encounter Medications as of 10/02/2017  Medication Sig  . albuterol (PROAIR HFA) 108 (90 Base) MCG/ACT inhaler UWSE 2 PUFFS EVERY 6 HOURS AS NEEDED FOR SHORTNESS OF BREATH.  Marland Kitchen albuterol (PROVENTIL) (2.5 MG/3ML) 0.083% nebulizer solution INHALE ONE VIAL VIA NEBULIZER EVERY 6 HOURS AS  NEEDED.  Marland Kitchen ALPRAZolam (XANAX) 1 MG tablet take 1 tablet by mouth twice a day  . aspirin (ASPIRIN LOW DOSE) 81 MG EC tablet Take 81 mg by mouth daily.    . budesonide-formoterol (SYMBICORT) 80-4.5 MCG/ACT inhaler Inhale 2 puffs into the lungs 2 (two) times daily.  . celecoxib (CELEBREX) 400 MG capsule Take 1 capsule (400 mg total) by mouth daily.  Marland Kitchen diltiazem (CARDIZEM CD) 120 MG 24 hr capsule Take 1 capsule (120 mg total) by mouth daily.  Marland Kitchen FLUoxetine (PROZAC) 20 MG capsule Take 1 capsule (20 mg total) by mouth daily.  . furosemide (LASIX) 20 MG tablet Take 1 tablet (20 mg total) by mouth 2 (two) times daily.  Marland Kitchen HYDROcodone-acetaminophen (NORCO/VICODIN) 5-325 MG tablet TAKE ONE TABLET THREE TIMES A DAY  . HYDROcodone-acetaminophen (NORCO/VICODIN) 5-325 MG tablet TAKE ONE TABLET THREE TIMES A DAY  . HYDROcodone-acetaminophen (NORCO/VICODIN) 5-325 MG tablet TAKE ONE TABLET THREE TIMES A DAY  . ipratropium-albuterol (DUONEB) 0.5-2.5 (3) MG/3ML SOLN Take 3 mLs by nebulization every 6 (six) hours as needed.  Marland Kitchen lisinopril (PRINIVIL,ZESTRIL) 10 MG tablet Take 1 tablet (10 mg total) by mouth daily.  Marland Kitchen omeprazole (PRILOSEC) 40 MG capsule Take 1 capsule (40 mg total) by mouth daily.  . potassium chloride SA (K-DUR,KLOR-CON) 20 MEQ tablet Take 1 tablet (20 mEq total) by mouth daily.  . rosuvastatin (CRESTOR) 20 MG tablet take 1 tablet by mouth once daily  .  tiZANidine (ZANAFLEX) 4 MG tablet Take 1 tablet (4 mg total) by mouth 3 (three) times daily.  . benzonatate (TESSALON) 200 MG capsule Take 1 capsule (200 mg total) by mouth 2 (two) times daily as needed for cough.  . doxycycline (VIBRA-TABS) 100 MG tablet Take 1 tablet (100 mg total) by mouth 2 (two) times daily.  . predniSONE (DELTASONE) 20 MG tablet Take 1 tablet (20 mg total) by mouth 2 (two) times daily with a meal.  . [EXPIRED] albuterol (PROVENTIL) (2.5 MG/3ML) 0.083% nebulizer solution 2.5 mg   . [EXPIRED] ipratropium (ATROVENT) nebulizer  solution 0.5 mg   . [EXPIRED] methylPREDNISolone acetate (DEPO-MEDROL) injection 80 mg    No facility-administered encounter medications on file as of 10/02/2017.     Allergies  Allergen Reactions  . Codeine   . Penicillins Other (See Comments)    Blisters in mouth after dental work Has patient had a PCN reaction causing immediate rash, facial/tongue/throat swelling, SOB or lightheadedness with hypotension: Yes Has patient had a PCN reaction causing severe rash involving mucus membranes or skin necrosis: No Has patient had a PCN reaction that required hospitalization No Has patient had a PCN reaction occurring within the last 10 years: Yes If all of the above answers are "NO", then may proceed with Cephalosporin use.     Review of Systems  Constitutional: Positive for activity change, appetite change and fatigue. Negative for chills and fever.  HENT: Positive for congestion and sore throat.   Eyes: Negative for photophobia and visual disturbance.  Respiratory: Positive for cough, shortness of breath and wheezing.   Cardiovascular: Negative for chest pain, palpitations and leg swelling.  Gastrointestinal: Negative for constipation and diarrhea.  Genitourinary: Negative for dysuria, flank pain and frequency.  Musculoskeletal: Negative for arthralgias and back pain.  Neurological: Positive for headaches. Negative for dizziness.  Psychiatric/Behavioral: Positive for sleep disturbance. Negative for dysphoric mood. The patient is not nervous/anxious.     BP (!) 162/68 (BP Location: Left Arm, Patient Position: Sitting, Cuff Size: Large)   Pulse 100   Temp 98.6 F (37 C) (Oral)   Resp (!) 24   Ht 5\' 3"  (1.6 m)   Wt 226 lb 1.9 oz (102.6 kg)   SpO2 94%   BMI 40.06 kg/m   Physical Exam  Constitutional: She is oriented to person, place, and time. She appears well-developed and well-nourished. She appears distressed.  Obese woman in moderate respiratory distress.  Frequent cough and  sputum production.  HENT:  Head: Normocephalic and atraumatic.  Right Ear: External ear normal.  Left Ear: External ear normal.  Mouth/Throat: Oropharynx is clear and moist.  Appears well-hydrated  Eyes: Conjunctivae are normal. Pupils are equal, round, and reactive to light.  Neck: Normal range of motion. Neck supple.  Cardiovascular: Regular rhythm and normal heart sounds.  Tachycardia  Pulmonary/Chest: Accessory muscle usage present. Tachypnea noted. She is in respiratory distress. She has decreased breath sounds in the right lower field and the left lower field. She has wheezes in the right upper field and the left upper field. She has no rhonchi. She has no rales.  Tachypnea, mild distress seen by use of accessory muscles.  Anterior wheeze  Musculoskeletal: Normal range of motion. She exhibits no edema.  Neurological: She is alert and oriented to person, place, and time.  Psychiatric: She has a normal mood and affect. Her behavior is normal.  Addendum; After nebulizer treatment with DuoNeb, patient has reduced respiratory rate of 18.  Increased ease of  respiration.  Decrease in wheezing although still has a few anterior wheezes.  No rales or rhonchi identified.  ASSESSMENT/PLAN:  1. COPD with acute exacerbation ([place]) Instructed patient that she should use her nebulizers at home when she has this much difficulty breathing.  She also should notify us and try to come in sooner before respiratory distress develops. - methylPREDNISolone acetate (DEPO-MEDROL) injection 80 mg - albuterol (PROVENTIL) (2.5 MG/3ML) 0.083% nebulizer solution 2.5 mg - ipratropium (ATROVENT) nebulizer solution 0.5 mg   Patient Instructions  Make sure you drink plenty fluids Take the Tessalon as needed for the coughing  take the antibiotic twice a day with food. Take the prednisone twice a day until finished Stay on your usual inhalers Call if not better by Monday  Congratulations on your efforts to quit  smoking  See Dr. Moshe Cipro for usual follow-up   Raylene Everts, MD

## 2017-10-23 ENCOUNTER — Ambulatory Visit (INDEPENDENT_AMBULATORY_CARE_PROVIDER_SITE_OTHER): Payer: PPO | Admitting: Family Medicine

## 2017-10-23 ENCOUNTER — Encounter: Payer: Self-pay | Admitting: Family Medicine

## 2017-10-23 VITALS — BP 158/72 | HR 104 | Resp 16 | Ht 63.0 in | Wt 230.0 lb

## 2017-10-23 DIAGNOSIS — Z0000 Encounter for general adult medical examination without abnormal findings: Secondary | ICD-10-CM

## 2017-10-23 DIAGNOSIS — R7301 Impaired fasting glucose: Secondary | ICD-10-CM

## 2017-10-23 DIAGNOSIS — R911 Solitary pulmonary nodule: Secondary | ICD-10-CM

## 2017-10-23 DIAGNOSIS — R918 Other nonspecific abnormal finding of lung field: Secondary | ICD-10-CM

## 2017-10-23 DIAGNOSIS — Z1231 Encounter for screening mammogram for malignant neoplasm of breast: Secondary | ICD-10-CM

## 2017-10-23 DIAGNOSIS — I110 Hypertensive heart disease with heart failure: Secondary | ICD-10-CM

## 2017-10-23 DIAGNOSIS — I10 Essential (primary) hypertension: Secondary | ICD-10-CM

## 2017-10-23 DIAGNOSIS — G8929 Other chronic pain: Secondary | ICD-10-CM

## 2017-10-23 DIAGNOSIS — E785 Hyperlipidemia, unspecified: Secondary | ICD-10-CM

## 2017-10-23 MED ORDER — HYDROCODONE-ACETAMINOPHEN 5-325 MG PO TABS: ORAL_TABLET | ORAL | 0 refills | Status: DC

## 2017-10-23 NOTE — Progress Notes (Signed)
Fill Date ID Written Drug Qty Days Prescriber Rx # Pharmacy Refill Daily Dose * Pymt Type PMP  10/19/2017  5  05/24/2017  Alprazolam 1 Mg Tablet  60 30 Ma Sim  [id number] [facility] ([id number])  0 4.00 LME Comm Ins  [place]  10/10/2017  5  10/09/2017  Hydrocodone-Acetamin 5-325 Mg  90 30 Ma Sim  [id number] [facility] ([id number])  0 15.00 MME Comm Ins  [place]  09/20/2017  2  05/24/2017  Alprazolam 1 Mg Tablet  60 30 Ma Sim  [id number] [facility] ([id number])  4 4.00 LME Medicare  [place]  09/09/2017  1  08/14/2017  Hydrocodone-Acetamin 5-325 Mg  90 26 Ma Sim  [id number] [facility] ([id number])  0 15.00 MME Medicare  [place]  08/22/2017  2  05/24/2017  Alprazolam 1 Mg Tablet  60 30 Ma Sim  [id number] [facility] ([id number])  3 4.00 LME Medicare  [place]  08/07/2017  2  05/21/2017  Hydrocodone-Acetamin 5-325 Mg  90 85 Ma Sim  [id number] [facility] ([id number])  0 15.00 MME Medicare  [place]  07/24/2017  2  05/24/2017  Alprazolam 1 Mg Tablet  60 30 Ma Sim  [id number] [facility] ([id number])  2 4.00 LME Medicare  [place]  07/08/2017  2  05/21/2017  Hydrocodone-Acetamin 5-325 Mg  90 6 Ma Sim  [id number] [facility] ([id number])  0 15.00 MME Medicare  [place]  06/24/2017  2  05/24/2017  Alprazolam 1 Mg Tablet  60 30 Ma Sim  [id number] [facility] ([id number])  1 4.00 LME Medicare  [place]  06/07/2017  2  05/21/2017  Hydrocodone-Acetamin 5-325 Mg  90 42 Ma Sim  [id number] [facility] ([id number])  0 15.00 MME Medicare  [place]  05/24/2017  2  05/24/2017  Alprazolam 1 Mg Tablet  60 30 Ma Sim  [id number] [facility] ([id number])  0 4.00 LME Medicare  [place]  05/08/2017  1  02/26/2017  Hydrocodone-Acetamin 5-325 Mg  90 30 Ma Sim  [id number] [facility] ([id number])  0 15.00 MME Medicare  [place]  04/24/2017  2  02/25/2017  Alprazolam 1 Mg Tablet  60 30 Ma Sim  [id number] [facility] ([id number])  2 4.00 LME Comm Ins  [place]  04/05/2017  1  03/29/2017  Hydrocodone-Acetamin 5-325 Mg             Reviewed at visit

## 2017-10-23 NOTE — Patient Instructions (Addendum)
F/u with MD end June or firtst week of July, call if you need mne before  Cologuard from home  Please schedule your mammogram and reschedule your appointment with the cardiologist  Congrats on quitting smoking  Fasting lipid, cmp and eGFr and hBA1C 1 week before next visit  No change in medication  Please work on lifestyle change for improved health Chest scan to be scheduled to follow up lung nodule , we will call with the appointment

## 2017-10-24 MED ORDER — TIZANIDINE HCL 4 MG PO TABS: 4.0000 mg | ORAL_TABLET | Freq: Three times a day (TID) | ORAL | 1 refills | Status: DC

## 2017-10-27 ENCOUNTER — Telehealth: Payer: Self-pay | Admitting: Family Medicine

## 2017-10-27 ENCOUNTER — Encounter: Payer: Self-pay | Admitting: Family Medicine

## 2017-10-27 NOTE — Assessment & Plan Note (Signed)
The patient's Controlled Substance registry is reviewed and compliance confirmed. Adequacy of  Pain control and level of function is assessed. Medication dosing is adjusted as deemed appropriate. Twelve weeks of medication is prescribed , patient signs for the script and is provided with a follow up appointment between 11 to 12 weeks .  

## 2017-10-27 NOTE — Assessment & Plan Note (Signed)
Quit smoking in 09/2017, needs chest CT f/u

## 2017-10-27 NOTE — Assessment & Plan Note (Signed)

## 2017-10-27 NOTE — Progress Notes (Signed)
    [name]     MRN: [id number]      DOB: 1945/08/18  HPI: Patient is in for annual physical exam. Chronic pain management is also addressed Needs her mammogram, she will schedule. Needs to reschedule her cardiology appointment which she was unable to kep    PE: BP (!) 158/72   Pulse (!) 104   Resp 16   Ht 5\' 3"  (1.6 m)   Wt 230 lb (104.3 kg)   SpO2 91%   BMI 40.74 kg/m   Pleasant  female, alert and oriented x 3, in no cardio-pulmonary distress. Afebrile. HEENT No facial trauma or asymetry. Sinuses non tender.  Extra occullar muscles intact, . External ears normal, tympanic membranes clear. Oropharynx moist, no exudate. Neck: supple, no adenopathy,JVD or thyromegaly.No bruits.  Chest: Clear to ascultation bilaterally.No crackles or wheezes. Non tender to palpation  Breast: No asymetry,no masses or lumps. No tenderness. No nipple discharge or inversion. No axillary or supraclavicular adenopathy  Cardiovascular system; Heart sounds normal,  S1 and  S2 ,no S3.  No murmur, or thrill. Apical beat not displaced Peripheral pulses normal.  Abdomen: Soft, non tender, no organomegaly or masses. No bruits. Bowel sounds normal. No guarding, tenderness or rebound.  Rectal:  Deferred.will submit cologard  GU: Asymptomatic and hysterectomy, not exam performed   Musculoskeletal exam: Decreased  ROM of spine,adequate in  hips , shoulders and knees. No deformity ,swelling or crepitus noted. No muscle wasting or atrophy.   Neurologic: Cranial nerves 2 to 12 intact. Power, tone ,sensation and reflexes normal throughout. No disturbance in gait. No tremor.  Skin: Intact, no ulceration, erythema , scaling or rash noted. Pigmentation normal throughout  Psych; Normal mood and affect. Judgement and concentration normal   Assessment & Plan:  Annual physical exam Annual exam as documented. Counseling done  re healthy lifestyle involving commitment to 150  minutes exercise per week, heart healthy diet, and attaining healthy weight.The importance of adequate sleep also discussed. Regular seat belt use and home safety, is also discussed. Changes in health habits are decided on by the patient with goals and time frames  set for achieving them. Immunization and cancer screening needs are specifically addressed at this visit.   Encounter for chronic pain management The patient's Controlled Substance registry is reviewed and compliance confirmed. Adequacy of  Pain control and level of function is assessed. Medication dosing is adjusted as deemed appropriate. Twelve weeks of medication is prescribed , patient signs for the script and is provided with a follow up appointment between 11 to 12 weeks .   Lung nodule, solitary Quit smoking in 09/2017, needs chest CT f/u

## 2017-10-27 NOTE — Telephone Encounter (Signed)
pls see referral for chest scan not due before 02/20/2018  She may ned help with scheduling mammogram and rescheduling cardiology appt!!! ( supposed to take care of both , just give her number fo cardiology office please   thanks!

## 2017-10-28 ENCOUNTER — Emergency Department (HOSPITAL_COMMUNITY): Payer: PPO

## 2017-10-28 ENCOUNTER — Ambulatory Visit (INDEPENDENT_AMBULATORY_CARE_PROVIDER_SITE_OTHER): Payer: PPO | Admitting: Family Medicine

## 2017-10-28 ENCOUNTER — Inpatient Hospital Stay (HOSPITAL_COMMUNITY)
Admission: EM | Admit: 2017-10-28 | Discharge: 2017-11-01 | DRG: 191 | Disposition: A | Discharge status: Home / Self Care | Payer: PPO | Attending: Internal Medicine | Admitting: Internal Medicine

## 2017-10-28 ENCOUNTER — Telehealth: Payer: Self-pay | Admitting: Family Medicine

## 2017-10-28 ENCOUNTER — Encounter (HOSPITAL_COMMUNITY): Payer: Self-pay | Admitting: Emergency Medicine

## 2017-10-28 DIAGNOSIS — J441 Chronic obstructive pulmonary disease with (acute) exacerbation: Secondary | ICD-10-CM | POA: Diagnosis present

## 2017-10-28 DIAGNOSIS — K219 Gastro-esophageal reflux disease without esophagitis: Secondary | ICD-10-CM | POA: Diagnosis present

## 2017-10-28 DIAGNOSIS — R918 Other nonspecific abnormal finding of lung field: Secondary | ICD-10-CM | POA: Diagnosis not present

## 2017-10-28 DIAGNOSIS — J101 Influenza due to other identified influenza virus with other respiratory manifestations: Secondary | ICD-10-CM | POA: Diagnosis present

## 2017-10-28 DIAGNOSIS — M549 Dorsalgia, unspecified: Secondary | ICD-10-CM | POA: Diagnosis present

## 2017-10-28 DIAGNOSIS — F419 Anxiety disorder, unspecified: Secondary | ICD-10-CM | POA: Diagnosis not present

## 2017-10-28 DIAGNOSIS — E860 Dehydration: Secondary | ICD-10-CM | POA: Diagnosis not present

## 2017-10-28 DIAGNOSIS — G8929 Other chronic pain: Secondary | ICD-10-CM | POA: Diagnosis present

## 2017-10-28 DIAGNOSIS — Z6841 Body Mass Index (BMI) 40.0 and over, adult: Secondary | ICD-10-CM | POA: Diagnosis not present

## 2017-10-28 DIAGNOSIS — Z79899 Other long term (current) drug therapy: Secondary | ICD-10-CM

## 2017-10-28 DIAGNOSIS — Z885 Allergy status to narcotic agent status: Secondary | ICD-10-CM | POA: Diagnosis not present

## 2017-10-28 DIAGNOSIS — J440 Chronic obstructive pulmonary disease with acute lower respiratory infection: Secondary | ICD-10-CM | POA: Diagnosis not present

## 2017-10-28 DIAGNOSIS — R05 Cough: Secondary | ICD-10-CM | POA: Diagnosis not present

## 2017-10-28 DIAGNOSIS — E785 Hyperlipidemia, unspecified: Secondary | ICD-10-CM | POA: Diagnosis present

## 2017-10-28 DIAGNOSIS — R0902 Hypoxemia: Secondary | ICD-10-CM

## 2017-10-28 DIAGNOSIS — R0602 Shortness of breath: Secondary | ICD-10-CM | POA: Diagnosis not present

## 2017-10-28 DIAGNOSIS — J189 Pneumonia, unspecified organism: Secondary | ICD-10-CM

## 2017-10-28 DIAGNOSIS — C3412 Malignant neoplasm of upper lobe, left bronchus or lung: Secondary | ICD-10-CM | POA: Diagnosis not present

## 2017-10-28 DIAGNOSIS — J181 Lobar pneumonia, unspecified organism: Secondary | ICD-10-CM | POA: Diagnosis not present

## 2017-10-28 DIAGNOSIS — J09X9 Influenza due to identified novel influenza A virus with other manifestations: Secondary | ICD-10-CM | POA: Diagnosis not present

## 2017-10-28 DIAGNOSIS — C3490 Malignant neoplasm of unspecified part of unspecified bronchus or lung: Secondary | ICD-10-CM | POA: Diagnosis not present

## 2017-10-28 DIAGNOSIS — J439 Emphysema, unspecified: Secondary | ICD-10-CM | POA: Diagnosis not present

## 2017-10-28 DIAGNOSIS — Z880 Allergy status to penicillin: Secondary | ICD-10-CM

## 2017-10-28 DIAGNOSIS — R911 Solitary pulmonary nodule: Secondary | ICD-10-CM | POA: Diagnosis not present

## 2017-10-28 DIAGNOSIS — I10 Essential (primary) hypertension: Secondary | ICD-10-CM | POA: Diagnosis present

## 2017-10-28 DIAGNOSIS — R846 Abnormal cytological findings in specimens from respiratory organs and thorax: Secondary | ICD-10-CM | POA: Diagnosis not present

## 2017-10-28 DIAGNOSIS — N179 Acute kidney failure, unspecified: Secondary | ICD-10-CM | POA: Diagnosis present

## 2017-10-28 DIAGNOSIS — E872 Acidosis: Secondary | ICD-10-CM | POA: Diagnosis present

## 2017-10-28 DIAGNOSIS — J449 Chronic obstructive pulmonary disease, unspecified: Secondary | ICD-10-CM | POA: Diagnosis present

## 2017-10-28 DIAGNOSIS — E876 Hypokalemia: Secondary | ICD-10-CM | POA: Diagnosis present

## 2017-10-28 DIAGNOSIS — C3492 Malignant neoplasm of unspecified part of left bronchus or lung: Secondary | ICD-10-CM | POA: Diagnosis present

## 2017-10-28 DIAGNOSIS — Z87891 Personal history of nicotine dependence: Secondary | ICD-10-CM

## 2017-10-28 DIAGNOSIS — Z1231 Encounter for screening mammogram for malignant neoplasm of breast: Secondary | ICD-10-CM

## 2017-10-28 HISTORY — DX: Chronic obstructive pulmonary disease, unspecified: J44.9

## 2017-10-28 LAB — COMPREHENSIVE METABOLIC PANEL
ALT: 15 U/L (ref 14–54)
ANION GAP: 12 (ref 5–15)
AST: 19 U/L (ref 15–41)
Albumin: 3.3 g/dL — ABNORMAL LOW (ref 3.5–5.0)
Alkaline Phosphatase: 112 U/L (ref 38–126)
BUN: 19 mg/dL (ref 6–20)
CO2: 26 mmol/L (ref 22–32)
Calcium: 8.5 mg/dL — ABNORMAL LOW (ref 8.9–10.3)
Chloride: 99 mmol/L — ABNORMAL LOW (ref 101–111)
Creatinine, Ser: 1.29 mg/dL — ABNORMAL HIGH (ref 0.44–1.00)
GFR calc non Af Amer: 41 mL/min — ABNORMAL LOW (ref >60)
GFR, EST AFRICAN AMERICAN: 48 mL/min — AB (ref >60)
Glucose, Bld: 147 mg/dL — ABNORMAL HIGH (ref 65–99)
POTASSIUM: 3.3 mmol/L — AB (ref 3.5–5.1)
SODIUM: 137 mmol/L (ref 135–145)
Total Bilirubin: 0.5 mg/dL (ref 0.3–1.2)
Total Protein: 6.9 g/dL (ref 6.5–8.1)

## 2017-10-28 LAB — CBC WITH DIFFERENTIAL/PLATELET
BASOS PCT: 0 %
Basophils Absolute: 0 10*3/uL (ref 0.0–0.1)
EOS ABS: 0 10*3/uL (ref 0.0–0.7)
Eosinophils Relative: 0 %
HCT: 38.8 % (ref 36.0–46.0)
Hemoglobin: 11.4 g/dL — ABNORMAL LOW (ref 12.0–15.0)
LYMPHS ABS: 1.3 10*3/uL (ref 0.7–4.0)
Lymphocytes Relative: 26 %
MCH: 25.3 pg — AB (ref 26.0–34.0)
MCHC: 29.4 g/dL — AB (ref 30.0–36.0)
MCV: 86.2 fL (ref 78.0–100.0)
MONOS PCT: 8 %
Monocytes Absolute: 0.4 10*3/uL (ref 0.1–1.0)
NEUTROS PCT: 66 %
Neutro Abs: 3.2 10*3/uL (ref 1.7–7.7)
PLATELETS: 249 10*3/uL (ref 150–400)
RBC: 4.5 MIL/uL (ref 3.87–5.11)
RDW: 16.5 % — ABNORMAL HIGH (ref 11.5–15.5)
WBC: 4.8 10*3/uL (ref 4.0–10.5)

## 2017-10-28 LAB — EXPECTORATED SPUTUM ASSESSMENT W REFEX TO RESP CULTURE

## 2017-10-28 LAB — EXPECTORATED SPUTUM ASSESSMENT W GRAM STAIN, RFLX TO RESP C

## 2017-10-28 LAB — BRAIN NATRIURETIC PEPTIDE: B NATRIURETIC PEPTIDE 5: 30 pg/mL (ref 0.0–100.0)

## 2017-10-28 LAB — LACTIC ACID, PLASMA
LACTIC ACID, VENOUS: 1.5 mmol/L (ref 0.5–1.9)
LACTIC ACID, VENOUS: 2.9 mmol/L — AB (ref 0.5–1.9)
Lactic Acid, Venous: 2.6 mmol/L (ref 0.5–1.9)

## 2017-10-28 LAB — TROPONIN I: Troponin I: <0.03 ng/mL (ref <0.03)

## 2017-10-28 MED ORDER — MOMETASONE FURO-FORMOTEROL FUM 100-5 MCG/ACT IN AERO
2.0000 | INHALATION_SPRAY | Freq: Two times a day (BID) | RESPIRATORY_TRACT | Status: DC
  Administered 2017-10-29 – 2017-11-01 (×5): 2 via RESPIRATORY_TRACT
  Filled 2017-10-28 (×3): qty 8.8

## 2017-10-28 MED ORDER — POTASSIUM CHLORIDE CRYS ER 20 MEQ PO TBCR: 20.0000 meq | EXTENDED_RELEASE_TABLET | Freq: Every day | ORAL | Status: DC

## 2017-10-28 MED ORDER — LEVOFLOXACIN IN D5W 750 MG/150ML IV SOLN: 750.0000 mg | INTRAVENOUS | Status: DC

## 2017-10-28 MED ORDER — IPRATROPIUM-ALBUTEROL 0.5-2.5 (3) MG/3ML IN SOLN
3.0000 mL | Freq: Once | RESPIRATORY_TRACT | Status: AC
  Administered 2017-10-28: 3 mL via RESPIRATORY_TRACT
  Filled 2017-10-28: qty 3

## 2017-10-28 MED ORDER — SODIUM CHLORIDE 0.9% FLUSH: 3.0000 mL | INTRAVENOUS | Status: DC | PRN

## 2017-10-28 MED ORDER — PREDNISONE 20 MG PO TABS
50.0000 mg | ORAL_TABLET | Freq: Every day | ORAL | Status: DC
  Administered 2017-10-29 – 2017-10-31 (×2): 50 mg via ORAL
  Filled 2017-10-28 (×3): qty 2

## 2017-10-28 MED ORDER — PREDNISONE 50 MG PO TABS
60.0000 mg | ORAL_TABLET | Freq: Once | ORAL | Status: AC
  Administered 2017-10-28: 17:00:00 60 mg via ORAL
  Filled 2017-10-28: qty 1

## 2017-10-28 MED ORDER — PANTOPRAZOLE SODIUM 40 MG PO TBEC
40.0000 mg | DELAYED_RELEASE_TABLET | Freq: Every day | ORAL | Status: DC
  Administered 2017-10-28 – 2017-11-01 (×5): 40 mg via ORAL
  Filled 2017-10-28 (×5): qty 1

## 2017-10-28 MED ORDER — POTASSIUM CHLORIDE CRYS ER 20 MEQ PO TBCR
40.0000 meq | EXTENDED_RELEASE_TABLET | Freq: Once | ORAL | Status: AC
  Administered 2017-10-28: 40 meq via ORAL
  Filled 2017-10-28: qty 2

## 2017-10-28 MED ORDER — SODIUM CHLORIDE 0.9 % IV SOLN
250.0000 mL | INTRAVENOUS | Status: DC | PRN
Start: 2017-10-28 — End: 2017-11-01

## 2017-10-28 MED ORDER — ALPRAZOLAM 1 MG PO TABS
1.0000 mg | ORAL_TABLET | Freq: Two times a day (BID) | ORAL | Status: DC
  Administered 2017-10-28 – 2017-11-01 (×8): 1 mg via ORAL
  Filled 2017-10-28 (×5): qty 1
  Filled 2017-10-28: qty 2
  Filled 2017-10-28 (×2): qty 1

## 2017-10-28 MED ORDER — LEVOFLOXACIN IN D5W 750 MG/150ML IV SOLN
750.0000 mg | Freq: Once | INTRAVENOUS | Status: AC
  Administered 2017-10-28: 750 mg via INTRAVENOUS
  Filled 2017-10-28: qty 150

## 2017-10-28 MED ORDER — IOPAMIDOL (ISOVUE-300) INJECTION 61%
75.0000 mL | Freq: Once | INTRAVENOUS | Status: AC | PRN
  Administered 2017-10-28: 75 mL via INTRAVENOUS

## 2017-10-28 MED ORDER — IPRATROPIUM-ALBUTEROL 0.5-2.5 (3) MG/3ML IN SOLN
3.0000 mL | Freq: Four times a day (QID) | RESPIRATORY_TRACT | Status: DC
Start: 2017-10-28 — End: 2017-11-01
  Administered 2017-10-28 – 2017-11-01 (×14): 3 mL via RESPIRATORY_TRACT
  Filled 2017-10-28 (×12): qty 3

## 2017-10-28 MED ORDER — ENOXAPARIN SODIUM 40 MG/0.4ML ~~LOC~~ SOLN
40.0000 mg | SUBCUTANEOUS | Status: DC
  Administered 2017-10-28 – 2017-10-31 (×4): 40 mg via SUBCUTANEOUS
  Filled 2017-10-28 (×4): qty 0.4

## 2017-10-28 MED ORDER — TIZANIDINE HCL 4 MG PO TABS
4.0000 mg | ORAL_TABLET | Freq: Three times a day (TID) | ORAL | Status: DC
  Administered 2017-10-29 – 2017-11-01 (×11): 4 mg via ORAL
  Filled 2017-10-28 (×11): qty 1

## 2017-10-28 MED ORDER — MAGNESIUM SULFATE 2 GM/50ML IV SOLN
2.0000 g | Freq: Once | INTRAVENOUS | Status: AC
  Administered 2017-10-28: 2 g via INTRAVENOUS
  Filled 2017-10-28: qty 50

## 2017-10-28 MED ORDER — DILTIAZEM HCL ER COATED BEADS 120 MG PO CP24
120.0000 mg | ORAL_CAPSULE | Freq: Every day | ORAL | Status: DC
  Administered 2017-10-29 – 2017-11-01 (×5): 120 mg via ORAL
  Filled 2017-10-28 (×5): qty 1

## 2017-10-28 MED ORDER — GUAIFENESIN ER 600 MG PO TB12
600.0000 mg | ORAL_TABLET | Freq: Two times a day (BID) | ORAL | Status: DC
  Administered 2017-10-29 – 2017-11-01 (×8): 600 mg via ORAL
  Filled 2017-10-28 (×8): qty 1

## 2017-10-28 MED ORDER — ASPIRIN 81 MG PO TBEC: 81.0000 mg | DELAYED_RELEASE_TABLET | Freq: Every day | ORAL | Status: DC

## 2017-10-28 MED ORDER — SODIUM CHLORIDE 0.9 % IV BOLUS (SEPSIS)
1000.0000 mL | Freq: Once | INTRAVENOUS | Status: AC
  Administered 2017-10-28: 1000 mL via INTRAVENOUS

## 2017-10-28 MED ORDER — SODIUM CHLORIDE 0.9% FLUSH
3.0000 mL | Freq: Two times a day (BID) | INTRAVENOUS | Status: DC
  Administered 2017-10-29 – 2017-11-01 (×7): 3 mL via INTRAVENOUS

## 2017-10-28 MED ORDER — CELECOXIB 400 MG PO CAPS: 400.0000 mg | ORAL_CAPSULE | Freq: Every day | ORAL | Status: DC

## 2017-10-28 MED ORDER — HYDROCODONE-ACETAMINOPHEN 5-325 MG PO TABS
1.0000 | ORAL_TABLET | Freq: Three times a day (TID) | ORAL | Status: DC
  Administered 2017-10-28 – 2017-11-01 (×11): 1 via ORAL
  Filled 2017-10-28 (×11): qty 1

## 2017-10-28 MED ORDER — ALBUTEROL SULFATE (2.5 MG/3ML) 0.083% IN NEBU: 2.5000 mg | INHALATION_SOLUTION | RESPIRATORY_TRACT | Status: DC | PRN

## 2017-10-28 MED ORDER — ORAL CARE MOUTH RINSE
15.0000 mL | Freq: Two times a day (BID) | OROMUCOSAL | Status: DC
  Administered 2017-11-01: 15 mL via OROMUCOSAL

## 2017-10-28 MED ORDER — CHLORHEXIDINE GLUCONATE 0.12 % MT SOLN
15.0000 mL | Freq: Two times a day (BID) | OROMUCOSAL | Status: DC
  Administered 2017-10-29 – 2017-11-01 (×7): 15 mL via OROMUCOSAL
  Filled 2017-10-28 (×8): qty 15

## 2017-10-28 MED ORDER — FLUOXETINE HCL 20 MG PO CAPS
20.0000 mg | ORAL_CAPSULE | Freq: Every day | ORAL | Status: DC
  Administered 2017-10-29 – 2017-11-01 (×4): 20 mg via ORAL
  Filled 2017-10-28 (×4): qty 1

## 2017-10-28 MED ORDER — LISINOPRIL 10 MG PO TABS: 10.0000 mg | ORAL_TABLET | Freq: Every day | ORAL | Status: DC

## 2017-10-28 MED ORDER — FUROSEMIDE 20 MG PO TABS
20.0000 mg | ORAL_TABLET | Freq: Two times a day (BID) | ORAL | Status: DC
  Administered 2017-10-28 – 2017-11-01 (×8): 20 mg via ORAL
  Filled 2017-10-28 (×8): qty 1

## 2017-10-28 MED ORDER — ROSUVASTATIN CALCIUM 20 MG PO TABS
20.0000 mg | ORAL_TABLET | Freq: Every day | ORAL | Status: DC
  Administered 2017-10-29 (×2): 20 mg via ORAL
  Filled 2017-10-28 (×2): qty 1

## 2017-10-28 NOTE — ED Notes (Signed)
Date and time results received: 10/28/17 2047   Test: Lactic Critical Value: 2.9  Name of Provider Notified: Dolly Rias, MD

## 2017-10-28 NOTE — ED Provider Notes (Signed)
Emergency Department Provider Note   I have reviewed the triage vital signs and the nursing notes.   HISTORY  Chief Complaint Shortness of Breath   HPI [name] is a 72 y.o. female is here with some shortness of breath and cough.  Patient states that she was seen by her primary doctor about a week ago she was started on antibiotics, steroids and cough medicine and she was get better so she stopped taking the medication before the end of the prescription in the last couple days she is had progressively worsening wheezing cough and shortness of breath again.  Patient states that she called Dr. Griffin Dakin office who told her to take a breathing treatment which she did not help so she went in for an appointment.  On arrival they sent her to the hospital for further evaluation and management.  Patient denies any fever, leg swelling.  She states that she is quit smoking.  No chest pain or back pain. No other associated or modifying symptoms.    Past Medical History:  Diagnosis Date  . Anxiety   . Arthritis   . Chronic back pain   . COPD (chronic obstructive pulmonary disease) ([place])   . 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 GERD (gastroesophageal reflux disease) 2000  . Hyperlipidemia 1995  . Hypertension 1995  . Nicotine addiction   . Obesity     Patient Active Problem List   Diagnosis Date Noted  . AKI (acute kidney injury) ([place]) 10/28/2017  . COPD with acute exacerbation ([place]) 10/28/2017  . COPD exacerbation ([place]) 10/28/2017  . Hypertensive heart disease with heart failure ([place]) 08/19/2017  . Episodic cigarette smoking dependence 05/22/2017  . Emphysema lung ([place]) 02/26/2017  . Lung nodule, solitary 02/26/2017  . Encounter for chronic pain management 08/26/2016  . Anemia, deficiency 07/27/2016  . Annual physical exam 04/12/2015  . H/O nicotine dependence 12/05/2014  . Depression 12/31/2013  . Female bladder prolapse 03/10/2013    . Impaired fasting glucose 10/03/2010  . Vitamin D deficiency 09/08/2009  . Back pain with sciatica 04/04/2009  . Essential hypertension 06/09/2008  . ABDOMINAL WALL HERNIA 06/09/2008  . Hyperlipidemia LDL goal <100 11/14/2007  . Morbid obesity ([place]) 11/14/2007  . Anxiety state 11/14/2007  . GERD 11/14/2007    Past Surgical History:  Procedure Laterality Date  . BACK SURGERY  1999  . CHOLECYSTECTOMY  2001  . COLONOSCOPY N/A 02/02/2016   Procedure: COLONOSCOPY;  Surgeon: [name], MD;  Location: [facility];  Service: Endoscopy;  Laterality: N/A;  930  . INCISIONAL HERNIA REPAIR  August 17, 2008  . left inguinal hernia herniorrhapy  1980  . removal of thmoma  03/27/2010   Dr. Arlyce Dice   . SPINE SURGERY    . TONSILLECTOMY    . TOTAL ABDOMINAL HYSTERECTOMY W/ BILATERAL SALPINGOOPHORECTOMY  1984    Current Outpatient Rx  . Order #: [id number] Class: Normal  . Order #: [id number] Class: Normal  . Order #: [id number] Class: Print  . Order #: [id number] Class: Historical Med  . Order #: [id number] Class: Normal  . Order #: [id number] Class: Normal  . Order #: [id number] Class: Normal  . Order #: [id number] Class: Normal  . Order #: [id number] Class: Normal  . Order #: [id number] Class: Print  . Order #: [id number] Class: Normal  . Order #: [id number] Class: Normal  . Order #: [id number] Class: Normal  . Order #: [id number] Class: Normal  . Order #: [id number] Class: Normal  Allergies Codeine and Penicillins  Family History  Problem Relation Age of Onset  . Heart disease Mother        enlarged  . Diabetes Mother   . Hypertension Mother   . Cancer Father        throat  . Hypertension Father   . Coronary artery disease Father   . Diabetes Sister        x3  . Asthma Sister   . Kidney disease Brother   . Stroke Brother     Social History Social History   Tobacco Use  . Smoking status: Former Smoker    Packs/day: 0.25    Types: Cigarettes    Last attempt to quit: 09/25/2017     Years since quitting: 0.0  . Smokeless tobacco: Never Used  . Tobacco comment: quit after recent COPD flare   Substance Use Topics  . Alcohol use: No    Alcohol/week: 0.0 oz  . Drug use: No    Review of Systems  All other systems negative except as documented in the HPI. All pertinent positives and negatives as reviewed in the HPI. ____________________________________________   PHYSICAL EXAM:  VITAL SIGNS: ED Triage Vitals  Enc Vitals Group     BP 10/28/17 1629 (!) 151/70     Pulse Rate 10/28/17 1629 89     Resp 10/28/17 1629 (!) 22     Temp 10/28/17 1629 98.6 F (37 C)     Temp Source 10/28/17 1629 Oral     SpO2 10/28/17 1629 96 %     Weight 10/28/17 1629 230 lb (104.3 kg)     Height 10/28/17 1629 5\' 3"  (1.6 m)    Constitutional: Alert and oriented. Well appearing and in no acute distress. Eyes: Conjunctivae are normal. PERRL. EOMI. Head: Atraumatic. Nose: No congestion/rhinnorhea. Mouth/Throat: Mucous membranes are moist.  Oropharynx non-erythematous. Neck: No stridor.  No meningeal signs.   Cardiovascular: Normal rate, regular rhythm. Good peripheral circulation. Grossly normal heart sounds.   Respiratory: tachypneic respiratory effort.  No retractions. Lungs crackles in bases and diffuse wheezing, diminished breath sounds. Gastrointestinal: Soft and nontender. No distention.  Musculoskeletal: No lower extremity tenderness nor edema. No gross deformities of extremities. Neurologic:  Normal speech and language. No gross focal neurologic deficits are appreciated.  Skin:  Skin is warm, dry and intact. No rash noted.   ____________________________________________   LABS (all labs ordered are listed, but only abnormal results are displayed)  Labs Reviewed  CBC WITH DIFFERENTIAL/PLATELET - Abnormal; Notable for the following components:      Result Value   Hemoglobin 11.4 (*)    MCH 25.3 (*)    MCHC 29.4 (*)    RDW 16.5 (*)    All other components within normal  limits  COMPREHENSIVE METABOLIC PANEL - Abnormal; Notable for the following components:   Potassium 3.3 (*)    Chloride 99 (*)    Glucose, Bld 147 (*)    Creatinine, Ser 1.29 (*)    Calcium 8.5 (*)    Albumin 3.3 (*)    GFR calc non Af Amer 41 (*)    GFR calc Af Amer 48 (*)    All other components within normal limits  LACTIC ACID, PLASMA - Abnormal; Notable for the following components:   Lactic Acid, Venous 2.9 (*)    All other components within normal limits  CULTURE, BLOOD (ROUTINE X 2)  CULTURE, BLOOD (ROUTINE X 2)  CULTURE, EXPECTORATED SPUTUM-ASSESSMENT  GRAM STAIN  RESPIRATORY PANEL  BY PCR  TROPONIN I  BRAIN NATRIURETIC PEPTIDE  LACTIC ACID, PLASMA  HIV ANTIBODY (ROUTINE TESTING)  STREP PNEUMONIAE URINARY ANTIGEN  INFLUENZA PANEL BY PCR (TYPE A & B)  LACTIC ACID, PLASMA   ____________________________________________  EKG   EKG Interpretation  Date/Time:  Monday October 28 2017 16:38:45 EDT Ventricular Rate:  94 PR Interval:    QRS Duration: 95 QT Interval:  371 QTC Calculation: 464 R Axis:   76 Text Interpretation:  Sinus rhythm Borderline short PR interval No significant change since last tracing aug 2011 Confirmed by Merrily Pew [phone]) on 10/28/2017 4:54:37 PM       ____________________________________________  RADIOLOGY  Ct Chest W Contrast  Result Date: 10/28/2017 CLINICAL DATA:  Cough and shortness of breath for 2 weeks, abnormal chest x-ray from earlier today EXAM: CT CHEST WITH CONTRAST TECHNIQUE: Multidetector CT imaging of the chest was performed during intravenous contrast administration. CONTRAST:  17mL ISOVUE-300 IOPAMIDOL (ISOVUE-300) INJECTION 61% COMPARISON:  Chest x-ray from earlier today, CT of the chest from 02/19/2017 FINDINGS: Cardiovascular: Thoracic aorta demonstrates atherosclerotic calcifications. No aneurysmal dilatation or dissection is seen. The pulmonary artery is well visualize without evidence of filling defect to suggest  pulmonary embolism. Heart is not significantly enlarged. No significant coronary calcifications are seen. Mediastinum/Nodes: The esophagus is within normal limits. Thoracic inlet is unremarkable. Scattered small mediastinal lymph nodes are noted. The largest of these lies along the lateral aspect of the aortic arch measuring approximately 8 mm. In the right hilum tiny lymph nodes are seen not significant by size criteria. In the left hilum, there is a 3.4 x 2.9 cm soft tissue mass lesion identified. Lungs/Pleura: Multiple smaller soft tissue masses are noted throughout the left upper lobe, the largest of which measures 16 mm. These changes are most consistent with pulmonary neoplasm. Scattered changes are noted in the subpleural region of the right lower lobe best seen on image number 92 of series 4. These are likely postinflammatory in nature. No sizable effusion or pneumothorax is noted. Upper Abdomen: Diffuse fatty infiltration of the liver is noted. Gallbladder has been surgically removed. Musculoskeletal: No acute bony abnormality noted. IMPRESSION: Scattered soft tissue mass lesions throughout the left upper lobe with associated left hilar mass most consistent with pulmonary neoplasm till proven otherwise. Further evaluation by means of bronchoscopic biopsy and PET-CT are recommended. Scattered changes in the right lower lobe likely postinflammatory in nature. Electronically Signed   By: Inez Catalina M.D.   On: 10/28/2017 19:14   Dg Chest Port 1 View  Result Date: 10/28/2017 CLINICAL DATA:  Cough, SOB, wheezing x1 week. Hx of COPD, Former smoker- quit 1 month ago. EXAM: PORTABLE CHEST 1 VIEW COMPARISON:  Chest x-ray dated 06/28/2014. Chest CT dated 02/19/2017. FINDINGS: Left upper lobe opacity, extending towards the left hilum, somewhat masslike in appearance at the left hilum. Right lung is clear. Heart size and mediastinal contours are stable. No pleural effusion seen. No acute or suspicious osseous  finding. IMPRESSION: Left upper lobe opacity, extending towards the left hilum, somewhat masslike in appearance at the left hilum. Findings could certainly represent pneumonia with perihilar extension and/or pneumonia with reactive perihilar lymphadenopathy. However, given the appearance and patient's smoking history, I am concerned this could represent a perihilar mass with postobstructive pneumonia or atelectasis. Recommend chest CT for further characterization. Electronically Signed   By: Franki Cabot M.D.   On: 10/28/2017 17:29    ____________________________________________   PROCEDURES  Procedure(s) performed:   Procedures  ____________________________________________   INITIAL IMPRESSION / ASSESSMENT AND PLAN / ED COURSE  COPD versus CHF versus pneumonia.  Will evaluate for all the above and treat symptomatically.  Xray interpreted by myself as looking like a left upper lobe pneumonia however when radiology read they felt that it could represent some type of neoplasm and recommend a CT scan.  CT scan done in concerning for possible cancer.   Story is more consistent with pneumonia so continue treating her for that.  Her breathing did not really improve much with the breathing treatment factor oxygen level got a little bit lower so she was started on nasal cannula oxygen.  Patient will be admitted for further workup of lung findings, pneumonia and hypoxia.   Pertinent labs & imaging results that were available during my care of the patient were reviewed by me and considered in my medical decision making (see chart for details).  ____________________________________________  FINAL CLINICAL IMPRESSION(S) / ED DIAGNOSES  Final diagnoses:  Hypoxia  Community acquired pneumonia of left upper lobe of lung ([place])     MEDICATIONS GIVEN DURING THIS VISIT:  Medications  ALPRAZolam (XANAX) tablet 1 mg (1 mg Oral Given 10/28/17 2220)  mometasone-formoterol (DULERA) 100-5 MCG/ACT  inhaler 2 puff (2 puffs Inhalation Not Given 10/28/17 2133)  diltiazem (CARDIZEM CD) 24 hr capsule 120 mg (not administered)  FLUoxetine (PROZAC) capsule 20 mg (not administered)  furosemide (LASIX) tablet 20 mg (20 mg Oral Given 10/28/17 2220)  HYDROcodone-acetaminophen (NORCO/VICODIN) 5-325 MG per tablet 1 tablet (1 tablet Oral Given 10/28/17 2219)  pantoprazole (PROTONIX) EC tablet 40 mg (40 mg Oral Given 10/28/17 2220)  rosuvastatin (CRESTOR) tablet 20 mg (not administered)  tiZANidine (ZANAFLEX) tablet 4 mg (not administered)  enoxaparin (LOVENOX) injection 40 mg (40 mg Subcutaneous Given 10/28/17 2219)  sodium chloride flush (NS) 0.9 % injection 3 mL (not administered)  sodium chloride flush (NS) 0.9 % injection 3 mL (not administered)  0.9 %  sodium chloride infusion (not administered)  levofloxacin (LEVAQUIN) IVPB 750 mg (not administered)  predniSONE (DELTASONE) tablet 50 mg (not administered)  ipratropium-albuterol (DUONEB) 0.5-2.5 (3) MG/3ML nebulizer solution 3 mL (3 mLs Nebulization Given 10/28/17 2132)  albuterol (PROVENTIL) (2.5 MG/3ML) 0.083% nebulizer solution 2.5 mg (not administered)  guaiFENesin (MUCINEX) 12 hr tablet 600 mg (not administered)  ipratropium-albuterol (DUONEB) 0.5-2.5 (3) MG/3ML nebulizer solution 3 mL (3 mLs Nebulization Given 10/28/17 1719)  magnesium sulfate IVPB 2 g 50 mL (0 g Intravenous Stopped 10/28/17 1803)  predniSONE (DELTASONE) tablet 60 mg (60 mg Oral Given 10/28/17 1710)  levofloxacin (LEVAQUIN) IVPB 750 mg (0 mg Intravenous Stopped 10/28/17 2009)  iopamidol (ISOVUE-300) 61 % injection 75 mL (75 mLs Intravenous Contrast Given 10/28/17 1829)  sodium chloride 0.9 % bolus 1,000 mL (0 mLs Intravenous Stopped 10/28/17 2200)  potassium chloride SA (K-DUR,KLOR-CON) CR tablet 40 mEq (40 mEq Oral Given 10/28/17 2220)     Melanie OUTPATIENT MEDICATIONS STARTED DURING THIS VISIT:  Melanie Prescriptions   No medications on file    Note:  This note was prepared with  assistance of Dragon voice recognition software. Occasional wrong-word or sound-a-like substitutions may have occurred due to the inherent limitations of voice recognition software.   Merrily Pew, MD 10/28/17 2250

## 2017-10-28 NOTE — Telephone Encounter (Signed)
Scheduled for today.

## 2017-10-28 NOTE — ED Triage Notes (Signed)
Pt here for shortness of breath, wheezing, and not feeling well for a week.  Pt was seen by Dr. Moshe Cipro and sent over here.

## 2017-10-28 NOTE — ED Notes (Signed)
Pt's sats 87-88% on RA when resting. Placed on 2L nasal cannula. Sats increased to 96%.

## 2017-10-28 NOTE — Progress Notes (Signed)
Pt in severe respiratory distress, wheezing with poor air entry. Reported decompensation this past weekend and did not complete antibiotics prescribed  Pulse Ox was 92% on room air. I took her in a wheelchair to her car and she was transported to the ED by her spouse, son followed Pt had no formal clinical evaluation so appt is to be cancelled/ not charged

## 2017-10-28 NOTE — H&P (Addendum)
History and Physical    [name] NFA:[id number] DOB: 1945/12/08 DOA: 10/28/2017    PCP: Fayrene Helper, MD  Patient coming from: home  Chief Complaint: sent by PCP or dyspnea and wheezing  HPI: [name] is a 72 y.o. female with medical history of COPD, HTN, Obesity, chronic back pain who presents from PCP's office who had visit planned with her today but sent her to the ER due to respiratory distress. The patient states she had tightness in her chest, shortness of breath and a cough with yellow mucous. She was given antibiotics (cannot remember name) for an acute bronchitis by Dr Meda Coffee (at Dr [facility] Dakin office) about 1-2 weeks ago. She was also given a steroid shot in the office and then 2 different cough medications. She stopped the antibiotic after a few days of use as she felt better. She has not had a fever, sinus or nasal drainage or sore throat. She feels better after treatment in the ER.  ED Course: pulse ox initially normal on room air    Given Duoneb, Prednisone 60 mg, IV Mg and Levaquin  Lactic acid 1.5 initially and then 2.9  CXR LUL opacitiy- CT recommended CT chest: Scattered soft tissue mass lesions throughout the left upper lobe with associated left hilar mass most consistent with pulmonary neoplasm till proven otherwise   Review of Systems:  All other systems reviewed and apart from HPI, are negative.  Past Medical History:  Diagnosis Date  . Anxiety   . Arthritis   . Chronic back pain   . 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 GERD (gastroesophageal reflux disease) 2000  . Hyperlipidemia 1995  . Hypertension 1995  . Nicotine addiction   . Obesity     Past Surgical History:  Procedure Laterality Date  . BACK SURGERY  1999  . CHOLECYSTECTOMY  2001  . COLONOSCOPY N/A 02/02/2016   Procedure: COLONOSCOPY;  Surgeon: [name], MD;  Location: [facility];  Service: Endoscopy;  Laterality: N/A;  930  .  INCISIONAL HERNIA REPAIR  August 17, 2008  . left inguinal hernia herniorrhapy  1980  . removal of thmoma  03/27/2010   Dr. Arlyce Dice   . SPINE SURGERY    . TONSILLECTOMY    . TOTAL ABDOMINAL HYSTERECTOMY W/ BILATERAL SALPINGOOPHORECTOMY  1984    Social History:   reports that she quit smoking about 4 weeks ago. Her smoking use included cigarettes. She smoked 0.25 packs per day. she has never used smokeless tobacco. She reports that she does not drink alcohol or use drugs.  Allergies  Allergen Reactions  . Codeine   . Penicillins Other (See Comments)    Blisters in mouth after dental work Has patient had a PCN reaction causing immediate rash, facial/tongue/throat swelling, SOB or lightheadedness with hypotension: Yes Has patient had a PCN reaction causing severe rash involving mucus membranes or skin necrosis: No Has patient had a PCN reaction that required hospitalization No Has patient had a PCN reaction occurring within the last 10 years: Yes If all of the above answers are "NO", then may proceed with Cephalosporin use.     Family History  Problem Relation Age of Onset  . Heart disease Mother        enlarged  . Diabetes Mother   . Hypertension Mother   . Cancer Father        throat  . Hypertension Father   .  Coronary artery disease Father   . Diabetes Sister        x3  . Asthma Sister   . Kidney disease Brother   . Stroke Brother      Prior to Admission medications   Medication Sig Start Date End Date Taking? Authorizing Provider  albuterol (PROAIR HFA) 108 (90 Base) MCG/ACT inhaler UWSE 2 PUFFS EVERY 6 HOURS AS NEEDED FOR SHORTNESS OF BREATH. 10/03/16  Yes Fayrene Helper, MD  albuterol (PROVENTIL) (2.5 MG/3ML) 0.083% nebulizer solution INHALE ONE VIAL VIA NEBULIZER EVERY 6 HOURS AS NEEDED. 04/30/16  Yes Fayrene Helper, MD  ALPRAZolam Duanne Moron) 1 MG tablet take 1 tablet by mouth twice a day 05/27/17  Yes Fayrene Helper, MD  aspirin (ASPIRIN LOW DOSE) 81 MG EC  tablet Take 81 mg by mouth daily.     Yes [provider]  budesonide-formoterol (SYMBICORT) 80-4.5 MCG/ACT inhaler Inhale 2 puffs into the lungs 2 (two) times daily. 01/14/17  Yes Fayrene Helper, MD  celecoxib (CELEBREX) 400 MG capsule Take 1 capsule (400 mg total) by mouth daily. 07/24/17  Yes Fayrene Helper, MD  diltiazem (CARDIZEM CD) 120 MG 24 hr capsule Take 1 capsule (120 mg total) by mouth daily. 06/13/17  Yes Fayrene Helper, MD  FLUoxetine (PROZAC) 20 MG capsule Take 1 capsule (20 mg total) by mouth daily. 05/21/17  Yes Fayrene Helper, MD  furosemide (LASIX) 20 MG tablet Take 1 tablet (20 mg total) by mouth 2 (two) times daily. 05/21/17  Yes Fayrene Helper, MD  HYDROcodone-acetaminophen (NORCO/VICODIN) 5-325 MG tablet TAKE ONE TABLET THREE TIMES A DAY 10/23/17  Yes Fayrene Helper, MD  lisinopril (PRINIVIL,ZESTRIL) 10 MG tablet Take 1 tablet (10 mg total) by mouth daily. 08/14/17  Yes Fayrene Helper, MD  omeprazole (PRILOSEC) 40 MG capsule Take 1 capsule (40 mg total) by mouth daily. 06/13/17  Yes Fayrene Helper, MD  potassium chloride SA (K-DUR,KLOR-CON) 20 MEQ tablet Take 1 tablet (20 mEq total) by mouth daily. 06/13/17  Yes Fayrene Helper, MD  rosuvastatin (CRESTOR) 20 MG tablet take 1 tablet by mouth once daily 05/30/17  Yes Fayrene Helper, MD  tiZANidine (ZANAFLEX) 4 MG tablet Take 1 tablet (4 mg total) by mouth 3 (three) times daily. 10/24/17  Yes Fayrene Helper, MD    Physical Exam: Wt Readings from Last 3 Encounters:  10/28/17 104.3 kg (230 lb)  10/23/17 104.3 kg (230 lb)  10/02/17 102.6 kg (226 lb 1.9 oz)   Vitals:   10/28/17 1730 10/28/17 1800 10/28/17 1859 10/28/17 1942  BP: 128/67 (!) 122/58 (!) 154/87 (!) 151/77  Pulse: 91 94 90 90  Resp: 20 18 18 18   Temp:    97.8 F (36.6 C)  TempSrc:    Oral  SpO2: 91% (!) 89% 99% 98%  Weight:      Height:          Constitutional: NAD, calm, comfortable Eyes: PERTLA, lids  and conjunctivae normal ENMT: Mucous membranes are moist. Posterior pharynx clear of any exudate or lesions. Normal dentition.  Neck: normal, supple, no masses, no thyromegaly Respiratory: poor air movement, wheezing and crackles in Left base. Normal respiratory effort. No accessory muscle use.  Cardiovascular: S1 & S2 heard, regular rate and rhythm, no murmurs / rubs / gallops. No extremity edema. 2+ pedal pulses. No carotid bruits.  Abdomen: No distension, no tenderness, no masses palpated. No hepatosplenomegaly. Bowel sounds normal.  Musculoskeletal: no clubbing / cyanosis.  No joint deformity upper and lower extremities. Good ROM, no contractures. Normal muscle tone.  Skin: no rashes, lesions, ulcers. No induration Neurologic: CN 2-12 grossly intact. . Strength 5/5 in all 4 limbs.  Psychiatric: Normal judgment and insight. Alert and oriented x 3. Normal mood.     Labs on Admission: I have personally reviewed following labs and imaging studies  CBC: Recent Labs  Lab 10/28/17 1702  WBC 4.8  NEUTROABS 3.2  HGB 11.4*  HCT 38.8  MCV 86.2  PLT 950   Basic Metabolic Panel: Recent Labs  Lab 10/28/17 1702  NA 137  K 3.3*  CL 99*  CO2 26  GLUCOSE 147*  BUN 19  CREATININE 1.29*  CALCIUM 8.5*   GFR: Estimated Creatinine Clearance: 47.6 mL/min (A) (by C-G formula based on SCr of 1.29 mg/dL (H)). Liver Function Tests: Recent Labs  Lab 10/28/17 1702  AST 19  ALT 15  ALKPHOS 112  BILITOT 0.5  PROT 6.9  ALBUMIN 3.3*   No results for input(s): LIPASE, AMYLASE in the last 168 hours. No results for input(s): AMMONIA in the last 168 hours. Coagulation Profile: No results for input(s): INR, PROTIME in the last 168 hours. Cardiac Enzymes: Recent Labs  Lab 10/28/17 1702  TROPONINI <0.03   BNP (last 3 results) No results for input(s): PROBNP in the last 8760 hours. HbA1C: No results for input(s): HGBA1C in the last 72 hours. CBG: No results for input(s): GLUCAP in the  last 168 hours. Lipid Profile: No results for input(s): CHOL, HDL, LDLCALC, TRIG, CHOLHDL, LDLDIRECT in the last 72 hours. Thyroid Function Tests: No results for input(s): TSH, T4TOTAL, FREET4, T3FREE, THYROIDAB in the last 72 hours. Anemia Panel: No results for input(s): VITAMINB12, FOLATE, FERRITIN, TIBC, IRON, RETICCTPCT in the last 72 hours. Urine analysis:    Component Value Date/Time   COLORURINE YELLOW 03/23/2010 1030   APPEARANCEUR CLOUDY (A) 03/23/2010 1030   LABSPEC 1.030 03/23/2010 1030   PHURINE 5.0 03/23/2010 1030   GLUCOSEU NEGATIVE 03/23/2010 1030   HGBUR NEGATIVE 03/23/2010 1030   BILIRUBINUR negative 05/31/2014 1118   KETONESUR 15 (A) 03/23/2010 1030   PROTEINUR negative 05/31/2014 1118   PROTEINUR NEGATIVE 03/23/2010 1030   UROBILINOGEN 0.2 05/31/2014 1118   UROBILINOGEN 1.0 03/23/2010 1030   NITRITE negative 05/31/2014 1118   NITRITE NEGATIVE 03/23/2010 1030   LEUKOCYTESUR Trace 05/31/2014 1118   Sepsis Labs: @LABRCNTIP (procalcitonin:4,lacticidven:4) )No results found for this or any previous visit (from the past 240 hour(s)).   Radiological Exams on Admission: Ct Chest W Contrast  Result Date: 10/28/2017 CLINICAL DATA:  Cough and shortness of breath for 2 weeks, abnormal chest x-ray from earlier today EXAM: CT CHEST WITH CONTRAST TECHNIQUE: Multidetector CT imaging of the chest was performed during intravenous contrast administration. CONTRAST:  45mL ISOVUE-300 IOPAMIDOL (ISOVUE-300) INJECTION 61% COMPARISON:  Chest x-ray from earlier today, CT of the chest from 02/19/2017 FINDINGS: Cardiovascular: Thoracic aorta demonstrates atherosclerotic calcifications. No aneurysmal dilatation or dissection is seen. The pulmonary artery is well visualize without evidence of filling defect to suggest pulmonary embolism. Heart is not significantly enlarged. No significant coronary calcifications are seen. Mediastinum/Nodes: The esophagus is within normal limits. Thoracic inlet  is unremarkable. Scattered small mediastinal lymph nodes are noted. The largest of these lies along the lateral aspect of the aortic arch measuring approximately 8 mm. In the right hilum tiny lymph nodes are seen not significant by size criteria. In the left hilum, there is a 3.4 x 2.9 cm soft tissue mass  lesion identified. Lungs/Pleura: Multiple smaller soft tissue masses are noted throughout the left upper lobe, the largest of which measures 16 mm. These changes are most consistent with pulmonary neoplasm. Scattered changes are noted in the subpleural region of the right lower lobe best seen on image number 92 of series 4. These are likely postinflammatory in nature. No sizable effusion or pneumothorax is noted. Upper Abdomen: Diffuse fatty infiltration of the liver is noted. Gallbladder has been surgically removed. Musculoskeletal: No acute bony abnormality noted. IMPRESSION: Scattered soft tissue mass lesions throughout the left upper lobe with associated left hilar mass most consistent with pulmonary neoplasm till proven otherwise. Further evaluation by means of bronchoscopic biopsy and PET-CT are recommended. Scattered changes in the right lower lobe likely postinflammatory in nature. Electronically Signed   By: Inez Catalina M.D.   On: 10/28/2017 19:14   Dg Chest Port 1 View  Result Date: 10/28/2017 CLINICAL DATA:  Cough, SOB, wheezing x1 week. Hx of COPD, Former smoker- quit 1 month ago. EXAM: PORTABLE CHEST 1 VIEW COMPARISON:  Chest x-ray dated 06/28/2014. Chest CT dated 02/19/2017. FINDINGS: Left upper lobe opacity, extending towards the left hilum, somewhat masslike in appearance at the left hilum. Right lung is clear. Heart size and mediastinal contours are stable. No pleural effusion seen. No acute or suspicious osseous finding. IMPRESSION: Left upper lobe opacity, extending towards the left hilum, somewhat masslike in appearance at the left hilum. Findings could certainly represent pneumonia with  perihilar extension and/or pneumonia with reactive perihilar lymphadenopathy. However, given the appearance and patient's smoking history, I am concerned this could represent a perihilar mass with postobstructive pneumonia or atelectasis. Recommend chest CT for further characterization. Electronically Signed   By: Franki Cabot M.D.   On: 10/28/2017 17:29    EKG: Independently reviewed. Sinus rhythm at 94bpm  Assessment/Plan Principal Problem:   COPD with acute exacerbation and LUL pneumonia (vs cancer) - cough with yellow sputum, wheezing and resp distress and LUL mass/ infiltrate - check resp virus panel and Influenza - plan:  IV steroids,  Levaquin, Nebs, mucinex, Dulera - ambulatory pulse ox in AM - further outpt imaging needed after treating acute infection  Active Problems: Elevated lactic acid - likely due to resp distress - repeat in 1 hr    AKI (acute kidney injury)- H/o CHF? - Cr 0.78 on 08/15/17 and 1.29 now - stop Lasix and Lisinopril -she was given 1 L NS in ER-  will not start continuous IV fluids  Hypokalemia - replace  Chronic back pain - cont hydrocodone and Tizanidine - hold Celebrex and ASA due to AKI  HTN -- cont Cardizem- hold Lisinopril and Lasix due to AKI    Hyperlipidemia LDL goal <100 - cont statin    Morbid obesity ([place]) - has  Been trying to lose weight Body mass index is 40.74 kg/m.    GERD - cont PPI    DVT prophylaxis: Lovenox  Code Status: Full code  Family Communication:   Disposition Plan: med-surg bed- possibly home in AM if ambulate without trouble  Consults called: none  Admission status: observation    Debbe Odea MD [facility] Pager: [email or web address] Password [email or web address] 7PM-7AM, please contact night-coverage   10/28/2017, 8:50 PM  Patient reexamined. No change from original H&P and my consult note

## 2017-10-28 NOTE — ED Notes (Signed)
Date and time results received: 10/28/17 10:58 PM   Test: Lactic Acid Critical Value: 2.6  Name of Provider Notified: Rizwan  Orders Received? Or Actions Taken?: paged MD, awaiting response

## 2017-10-28 NOTE — Telephone Encounter (Signed)
Pt is calling and wheezing on the phone, she is not feeling good, Cough, tightness in chest,

## 2017-10-28 NOTE — ED Notes (Signed)
MD Rizwan requests repeat lactic acid with "morning blood draw". Verified morning lab draw at 0400 per lab.

## 2017-10-28 NOTE — ED Notes (Signed)
Pt transported to xray 

## 2017-10-29 ENCOUNTER — Other Ambulatory Visit: Payer: Self-pay

## 2017-10-29 DIAGNOSIS — R918 Other nonspecific abnormal finding of lung field: Secondary | ICD-10-CM | POA: Diagnosis not present

## 2017-10-29 DIAGNOSIS — E872 Acidosis: Secondary | ICD-10-CM | POA: Diagnosis present

## 2017-10-29 DIAGNOSIS — E785 Hyperlipidemia, unspecified: Secondary | ICD-10-CM | POA: Diagnosis present

## 2017-10-29 DIAGNOSIS — J101 Influenza due to other identified influenza virus with other respiratory manifestations: Secondary | ICD-10-CM | POA: Diagnosis present

## 2017-10-29 DIAGNOSIS — J440 Chronic obstructive pulmonary disease with acute lower respiratory infection: Secondary | ICD-10-CM | POA: Diagnosis present

## 2017-10-29 DIAGNOSIS — J441 Chronic obstructive pulmonary disease with (acute) exacerbation: Secondary | ICD-10-CM | POA: Diagnosis present

## 2017-10-29 DIAGNOSIS — R0902 Hypoxemia: Secondary | ICD-10-CM | POA: Diagnosis present

## 2017-10-29 DIAGNOSIS — Z885 Allergy status to narcotic agent status: Secondary | ICD-10-CM | POA: Diagnosis not present

## 2017-10-29 DIAGNOSIS — G8929 Other chronic pain: Secondary | ICD-10-CM | POA: Diagnosis present

## 2017-10-29 DIAGNOSIS — E876 Hypokalemia: Secondary | ICD-10-CM | POA: Diagnosis present

## 2017-10-29 DIAGNOSIS — Z87891 Personal history of nicotine dependence: Secondary | ICD-10-CM | POA: Diagnosis not present

## 2017-10-29 DIAGNOSIS — Z880 Allergy status to penicillin: Secondary | ICD-10-CM | POA: Diagnosis not present

## 2017-10-29 DIAGNOSIS — J449 Chronic obstructive pulmonary disease, unspecified: Secondary | ICD-10-CM | POA: Diagnosis present

## 2017-10-29 DIAGNOSIS — N179 Acute kidney failure, unspecified: Secondary | ICD-10-CM | POA: Diagnosis present

## 2017-10-29 DIAGNOSIS — J439 Emphysema, unspecified: Secondary | ICD-10-CM | POA: Diagnosis not present

## 2017-10-29 DIAGNOSIS — F419 Anxiety disorder, unspecified: Secondary | ICD-10-CM | POA: Diagnosis present

## 2017-10-29 DIAGNOSIS — E860 Dehydration: Secondary | ICD-10-CM | POA: Diagnosis present

## 2017-10-29 DIAGNOSIS — I10 Essential (primary) hypertension: Secondary | ICD-10-CM | POA: Diagnosis present

## 2017-10-29 DIAGNOSIS — M549 Dorsalgia, unspecified: Secondary | ICD-10-CM | POA: Diagnosis present

## 2017-10-29 DIAGNOSIS — Z6841 Body Mass Index (BMI) 40.0 and over, adult: Secondary | ICD-10-CM | POA: Diagnosis not present

## 2017-10-29 DIAGNOSIS — K219 Gastro-esophageal reflux disease without esophagitis: Secondary | ICD-10-CM | POA: Diagnosis present

## 2017-10-29 DIAGNOSIS — Z79899 Other long term (current) drug therapy: Secondary | ICD-10-CM | POA: Diagnosis not present

## 2017-10-29 DIAGNOSIS — C3412 Malignant neoplasm of upper lobe, left bronchus or lung: Secondary | ICD-10-CM | POA: Diagnosis present

## 2017-10-29 LAB — RESPIRATORY PANEL BY PCR
ADENOVIRUS-RVPPCR: NOT DETECTED
BORDETELLA PERTUSSIS-RVPCR: NOT DETECTED
CHLAMYDOPHILA PNEUMONIAE-RVPPCR: NOT DETECTED
CORONAVIRUS NL63-RVPPCR: NOT DETECTED
Coronavirus 229E: NOT DETECTED
Coronavirus HKU1: NOT DETECTED
Coronavirus OC43: NOT DETECTED
INFLUENZA A H1 2009-RVPPR: DETECTED — AB
INFLUENZA B-RVPPCR: NOT DETECTED
MYCOPLASMA PNEUMONIAE-RVPPCR: NOT DETECTED
Metapneumovirus: NOT DETECTED
PARAINFLUENZA VIRUS 3-RVPPCR: NOT DETECTED
PARAINFLUENZA VIRUS 4-RVPPCR: NOT DETECTED
Parainfluenza Virus 1: NOT DETECTED
Parainfluenza Virus 2: NOT DETECTED
RESPIRATORY SYNCYTIAL VIRUS-RVPPCR: NOT DETECTED
RHINOVIRUS / ENTEROVIRUS - RVPPCR: NOT DETECTED

## 2017-10-29 LAB — BASIC METABOLIC PANEL
Anion gap: 16 — ABNORMAL HIGH (ref 5–15)
BUN: 14 mg/dL (ref 6–20)
CHLORIDE: 100 mmol/L — AB (ref 101–111)
CO2: 22 mmol/L (ref 22–32)
CREATININE: 0.79 mg/dL (ref 0.44–1.00)
Calcium: 8.2 mg/dL — ABNORMAL LOW (ref 8.9–10.3)
GFR calc Af Amer: >60 mL/min (ref >60)
GFR calc non Af Amer: >60 mL/min (ref >60)
Glucose, Bld: 244 mg/dL — ABNORMAL HIGH (ref 65–99)
Potassium: 4 mmol/L (ref 3.5–5.1)
SODIUM: 138 mmol/L (ref 135–145)

## 2017-10-29 LAB — LACTIC ACID, PLASMA: Lactic Acid, Venous: 2.5 mmol/L (ref 0.5–1.9)

## 2017-10-29 LAB — INFLUENZA PANEL BY PCR (TYPE A & B)
INFLAPCR: POSITIVE — AB
Influenza B By PCR: NEGATIVE

## 2017-10-29 LAB — STREP PNEUMONIAE URINARY ANTIGEN: Strep Pneumo Urinary Antigen: NEGATIVE

## 2017-10-29 MED ORDER — CHLORHEXIDINE GLUCONATE CLOTH 2 % EX PADS
6.0000 | MEDICATED_PAD | Freq: Once | CUTANEOUS | Status: AC
  Administered 2017-10-30: 6 via TOPICAL

## 2017-10-29 MED ORDER — LEVOFLOXACIN IN D5W 750 MG/150ML IV SOLN: 750.0000 mg | INTRAVENOUS | Status: DC

## 2017-10-29 MED ORDER — OSELTAMIVIR PHOSPHATE 30 MG PO CAPS
30.0000 mg | ORAL_CAPSULE | Freq: Two times a day (BID) | ORAL | Status: DC
  Administered 2017-10-29 – 2017-11-01 (×6): 30 mg via ORAL
  Filled 2017-10-29 (×6): qty 1

## 2017-10-29 MED ORDER — CHLORHEXIDINE GLUCONATE CLOTH 2 % EX PADS
6.0000 | MEDICATED_PAD | Freq: Once | CUTANEOUS | Status: AC
  Administered 2017-10-29: 6 via TOPICAL

## 2017-10-29 MED ORDER — ROSUVASTATIN CALCIUM 20 MG PO TABS
20.0000 mg | ORAL_TABLET | Freq: Every day | ORAL | Status: DC
  Administered 2017-10-30 – 2017-10-31 (×2): 20 mg via ORAL
  Filled 2017-10-29 (×2): qty 1

## 2017-10-29 MED ORDER — SODIUM CHLORIDE 0.9 % IV BOLUS (SEPSIS)
1000.0000 mL | Freq: Once | INTRAVENOUS | Status: AC
  Administered 2017-10-29: 1000 mL via INTRAVENOUS

## 2017-10-29 MED ORDER — OSELTAMIVIR PHOSPHATE 75 MG PO CAPS: 75.0000 mg | ORAL_CAPSULE | Freq: Every day | ORAL | Status: DC

## 2017-10-29 MED ORDER — LEVOFLOXACIN IN D5W 750 MG/150ML IV SOLN
750.0000 mg | INTRAVENOUS | Status: DC
Start: 2017-10-29 — End: 2017-11-01
  Administered 2017-10-29 – 2017-10-31 (×3): 750 mg via INTRAVENOUS
  Filled 2017-10-29 (×3): qty 150

## 2017-10-29 NOTE — Progress Notes (Signed)
PROGRESS NOTE    [name]  MWU:[id number]  DOB: [birth year]  DOA: 10/28/2017 PCP: Fayrene Helper, MD  Brief Admission Hx: [name] is a 72 y.o. female with medical history of COPD, HTN, Obesity, chronic back pain who presents from PCP's office who had visit planned with her today but sent her to the ER due to respiratory distress. The patient states she had tightness in her chest, shortness of breath and a cough with yellow mucous. She was given antibiotics for an acute bronchitis by Dr Meda Coffee (at Dr [facility] Dakin office) about 1-2 weeks ago. She was also given a steroid shot in the office and then 2 different cough medications. She stopped the antibiotic after a few days of use as she felt better. She has not had a fever, sinus or nasal drainage or sore throat. She feels better after treatment in the ER.  She had a CT chest that was suspicious for a primary lung cancer.    MDM/Assessment & Plan:   1. COPD with acute exacerbation - continue current therapy with nebs, steroids, antibiotics. Ambulatory pulse ox in the AM.    2. AKI - ? prerenal treating with IVFs.  3. Hypokalemia - repleted, recheck in AM. 4. Chronic back pain - resume home hydrocodone and tizanidine 5. Essential hypertension - holding lisinopril due to AKI, resumed home cardizem.  6. Hyperlipidemia - continue statin daily. 7. Question of primary lung cancer - Pulmonology consulted and planning for bronchoscopy in AM.   DVT prophylaxis: lovenox Code Status: Full  Family Communication: patient  Disposition Plan: pending further clinical improvement and workup for lung mass  Consultants:  pulmonology  Procedures:  Bronchoscopy (pending)  Subjective: Pt says that she is feeling a little better today.  She denies chest pain.  Nebs are helping.   Objective: Vitals:   10/28/17 2312 10/29/17 0550 10/29/17 0813 10/29/17 0826  BP: (!) 161/75 140/66    Pulse: 98 83    Resp: (!) 22 20    Temp: (!) 97.5  F (36.4 C) 98 F (36.7 C)    TempSrc: Oral Oral    SpO2: 98% 98% 96% 99%  Weight: 103.3 kg (227 lb 11.8 oz)     Height: 5' 3.5" (1.613 m)       Intake/Output Summary (Last 24 hours) at 10/29/2017 1003 Last data filed at 10/29/2017 0900 Gross per 24 hour  Intake 1800 ml  Output 500 ml  Net 1300 ml   Filed Weights   10/28/17 1629 10/28/17 2312  Weight: 104.3 kg (230 lb) 103.3 kg (227 lb 11.8 oz)     REVIEW OF SYSTEMS  As per history otherwise all reviewed and reported negative  Exam:  General exam: awake, alert, NAD, cooperative.  Respiratory system: diffuse exp wheezing. No increased work of breathing. Cardiovascular system: S1 & S2 heard, RRR. No JVD, murmurs, gallops, clicks or pedal edema. Gastrointestinal system: Abdomen is nondistended, soft and nontender. Normal bowel sounds heard. Central nervous system: Alert and oriented. No focal neurological deficits. Extremities: no CCE.  Data Reviewed: Basic Metabolic Panel: Recent Labs  Lab 10/28/17 1702  NA 137  K 3.3*  CL 99*  CO2 26  GLUCOSE 147*  BUN 19  CREATININE 1.29*  CALCIUM 8.5*   Liver Function Tests: Recent Labs  Lab 10/28/17 1702  AST 19  ALT 15  ALKPHOS 112  BILITOT 0.5  PROT 6.9  ALBUMIN 3.3*   No results for input(s): LIPASE, AMYLASE in the last 168  hours. No results for input(s): AMMONIA in the last 168 hours. CBC: Recent Labs  Lab 10/28/17 1702  WBC 4.8  NEUTROABS 3.2  HGB 11.4*  HCT 38.8  MCV 86.2  PLT 249   Cardiac Enzymes: Recent Labs  Lab 10/28/17 1702  TROPONINI <0.03   CBG (last 3)  No results for input(s): GLUCAP in the last 72 hours. Recent Results (from the past 240 hour(s))  Blood culture (routine x 2)     Status: None (Preliminary result)   Collection Time: 10/28/17  5:29 PM  Result Value Ref Range Status   Specimen Description LEFT ANTECUBITAL  Final   Special Requests   Final    ANAEROBIC BOTTLE ONLY Blood Culture adequate volume   Culture   Final    NO  GROWTH < 24 HOURS Performed at [facility], [address] [place], [place] [address]    Report Status PENDING  Incomplete  Blood culture (routine x 2)     Status: None (Preliminary result)   Collection Time: 10/28/17  5:29 PM  Result Value Ref Range Status   Specimen Description RIGHT ANTECUBITAL  Final   Special Requests   Final    BOTTLES DRAWN AEROBIC AND ANAEROBIC Blood Culture adequate volume   Culture   Final    NO GROWTH < 24 HOURS Performed at [facility], [address] [place], [place] [address]    Report Status PENDING  Incomplete  Culture, sputum-assessment     Status: None   Collection Time: 10/28/17 10:24 PM  Result Value Ref Range Status   Specimen Description EXPECTORATED SPUTUM  Final   Special Requests NONE  Final   Sputum evaluation   Final    Sputum specimen not acceptable for testing.  Please recollect.   SPOKE WITH CICELY, RN @ [phone] ON 3.18.19 Performed at [facility], [address] [place],  [address]    Report Status 10/28/2017 FINAL  Final     Studies: Ct Chest W Contrast  Result Date: 10/28/2017 CLINICAL DATA:  Cough and shortness of breath for 2 weeks, abnormal chest x-ray from earlier today EXAM: CT CHEST WITH CONTRAST TECHNIQUE: Multidetector CT imaging of the chest was performed during intravenous contrast administration. CONTRAST:  24mL ISOVUE-300 IOPAMIDOL (ISOVUE-300) INJECTION 61% COMPARISON:  Chest x-ray from earlier today, CT of the chest from 02/19/2017 FINDINGS: Cardiovascular: Thoracic aorta demonstrates atherosclerotic calcifications. No aneurysmal dilatation or dissection is seen. The pulmonary artery is well visualize without evidence of filling defect to suggest pulmonary embolism. Heart is not significantly enlarged. No significant coronary calcifications are seen. Mediastinum/Nodes: The esophagus is within normal limits. Thoracic inlet is unremarkable. Scattered small mediastinal lymph nodes are noted. The largest of these lies  along the lateral aspect of the aortic arch measuring approximately 8 mm. In the right hilum tiny lymph nodes are seen not significant by size criteria. In the left hilum, there is a 3.4 x 2.9 cm soft tissue mass lesion identified. Lungs/Pleura: Multiple smaller soft tissue masses are noted throughout the left upper lobe, the largest of which measures 16 mm. These changes are most consistent with pulmonary neoplasm. Scattered changes are noted in the subpleural region of the right lower lobe best seen on image number 92 of series 4. These are likely postinflammatory in nature. No sizable effusion or pneumothorax is noted. Upper Abdomen: Diffuse fatty infiltration of the liver is noted. Gallbladder has been surgically removed. Musculoskeletal: No acute bony abnormality noted. IMPRESSION: Scattered soft tissue mass lesions throughout the  left upper lobe with associated left hilar mass most consistent with pulmonary neoplasm till proven otherwise. Further evaluation by means of bronchoscopic biopsy and PET-CT are recommended. Scattered changes in the right lower lobe likely postinflammatory in nature. Electronically Signed   By: Inez Catalina M.D.   On: 10/28/2017 19:14   Dg Chest Port 1 View  Result Date: 10/28/2017 CLINICAL DATA:  Cough, SOB, wheezing x1 week. Hx of COPD, Former smoker- quit 1 month ago. EXAM: PORTABLE CHEST 1 VIEW COMPARISON:  Chest x-ray dated 06/28/2014. Chest CT dated 02/19/2017. FINDINGS: Left upper lobe opacity, extending towards the left hilum, somewhat masslike in appearance at the left hilum. Right lung is clear. Heart size and mediastinal contours are stable. No pleural effusion seen. No acute or suspicious osseous finding. IMPRESSION: Left upper lobe opacity, extending towards the left hilum, somewhat masslike in appearance at the left hilum. Findings could certainly represent pneumonia with perihilar extension and/or pneumonia with reactive perihilar lymphadenopathy. However, given the  appearance and patient's smoking history, I am concerned this could represent a perihilar mass with postobstructive pneumonia or atelectasis. Recommend chest CT for further characterization. Electronically Signed   By: Franki Cabot M.D.   On: 10/28/2017 17:29   Scheduled Meds: . ALPRAZolam  1 mg Oral BID  . chlorhexidine  15 mL Mouth Rinse BID  . diltiazem  120 mg Oral Daily  . enoxaparin (LOVENOX) injection  40 mg Subcutaneous Q24H  . FLUoxetine  20 mg Oral Daily  . furosemide  20 mg Oral BID  . guaiFENesin  600 mg Oral BID  . HYDROcodone-acetaminophen  1 tablet Oral Q8H  . ipratropium-albuterol  3 mL Nebulization Q6H  . mouth rinse  15 mL Mouth Rinse q12n4p  . mometasone-formoterol  2 puff Inhalation BID  . oseltamivir  30 mg Oral BID  . pantoprazole  40 mg Oral Daily  . predniSONE  50 mg Oral Q breakfast  . rosuvastatin  20 mg Oral Daily  . sodium chloride flush  3 mL Intravenous Q12H  . tiZANidine  4 mg Oral TID   Continuous Infusions: . sodium chloride    . [START ON 10/30/2017] levofloxacin (LEVAQUIN) IV    . sodium chloride 1,000 mL (10/29/17 0725)    Principal Problem:   COPD with acute exacerbation ([facility]) Active Problems:   Hyperlipidemia LDL goal <100   Morbid obesity ([facility])   Essential hypertension   GERD   Emphysema lung ([facility])   Mass of upper lobe of left lung   AKI (acute kidney injury) ([place])   COPD exacerbation ([place])  Time spent:   Irwin Brakeman, MD, FAAFP [facility] Pager [phone]  If 7PM-7AM, please contact night-coverage [email or web address] Password [email or web address] 10/29/2017, 10:03 AM    LOS: 0 days

## 2017-10-29 NOTE — Telephone Encounter (Signed)
I can not schedule CT/ however I will take care of her mammogram and cardiology appt.

## 2017-10-29 NOTE — Progress Notes (Signed)
CRITICAL VALUE ALERT  Critical Value:  Lactic Acid 2.5  Date & Time Notied:  10/29/17 @ 0500  Provider Notified: Dr. Olevia Bowens  Orders Received/Actions taken: Waiting for orders/callback- pts last Lactic acid was 2.6 so Dr. Olevia Bowens made aware lab had came down.

## 2017-10-29 NOTE — Progress Notes (Signed)
Pt states she takes Crestor at night instead of in the morning -given this am at 0846. This RN placed a note to pharmacy to adjust times. Will pass on to day Rn.

## 2017-10-29 NOTE — Consult Note (Signed)
Consult requested by: [facility] hospitalist, Dr. Wynelle Cleveland Consult requested for: Abnormal chest CT  HPI: This is a 72 year old who came to the emergency room with increasing shortness of breath.  She had a chest x-ray that was abnormal and then had CT done.  CT is consistent with a large left hilar mass and left lung opacities suggestive of primary lung cancer.  She is been sick for about 2 weeks and has been on antibiotics but it has not made much difference.  She has been coughing up yellow sputum.  She has not had any chest pain.  No weight loss.  No abdominal pain nausea vomiting diarrhea.  No headaches.  She has not had any hemoptysis.  Past Medical History:  Diagnosis Date  . Anxiety   . Arthritis   . Chronic back pain   . COPD (chronic obstructive pulmonary disease) ([place])   . 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 GERD (gastroesophageal reflux disease) 2000  . Hyperlipidemia 1995  . Hypertension 1995  . Nicotine addiction   . Obesity      Family History  Problem Relation Age of Onset  . Heart disease Mother        enlarged  . Diabetes Mother   . Hypertension Mother   . Cancer Father        throat  . Hypertension Father   . Coronary artery disease Father   . Diabetes Sister        x3  . Asthma Sister   . Kidney disease Brother   . Stroke Brother      Social History   Socioeconomic History  . Marital status: Married    Spouse name: None  . Number of children: None  . Years of education: None  . Highest education level: None  Social Needs  . Financial resource strain: Not hard at all  . Food insecurity - worry: Never true  . Food insecurity - inability: Never true  . Transportation needs - medical: No  . Transportation needs - non-medical: No  Occupational History  . Occupation: employed    Comment: still working- owns [employer]  Tobacco Use  . Smoking status: Former Smoker    Packs/day: 0.25    Types: Cigarettes     Last attempt to quit: 09/25/2017    Years since quitting: 0.0  . Smokeless tobacco: Never Used  . Tobacco comment: quit after recent COPD flare   Substance and Sexual Activity  . Alcohol use: No    Alcohol/week: 0.0 oz  . Drug use: No  . Sexual activity: Yes  Other Topics Concern  . None  Social History Narrative   Pt had 2 stillborns      ROS: Except as mentioned 10 point review of systems is negative    Objective: Vital signs in last 24 hours: Temp:  [97.5 F (36.4 C)-98.6 F (37 C)] 98 F (36.7 C) (03/19 0550) Pulse Rate:  [83-99] 83 (03/19 0550) Resp:  [17-22] 20 (03/19 0550) BP: (122-173)/(58-107) 140/66 (03/19 0550) SpO2:  [89 %-99 %] 99 % (03/19 0826) FiO2 (%):  [28 %] 28 % (03/18 2056) Weight:  [103.3 kg (227 lb 11.8 oz)-104.3 kg (230 lb)] 103.3 kg (227 lb 11.8 oz) (03/18 2312) Weight change:  Last BM Date: 10/29/17  Intake/Output from previous day: 03/18 0701 - 03/19 0700 In: 1560 [P.O.:360; IV Piggyback:1200] Out: 500 [Urine:500]  PHYSICAL EXAM Constitutional: She is awake and  alert and in no acute distress.  Eyes: Pupils react EOMI.  Ears nose mouth and throat: Mucous membranes are moist.  Her neck is supple.  Hearing is grossly normal.  Throat is clear.  Respiratory: Her respiratory effort is normal.  She has some rhonchi on the left greater than the right lung.  Cardiovascular: Her heart is regular with normal heart sounds.  Gastrointestinal: Her abdomen is soft with no masses.  Skin: Warm and dry.  Musculoskeletal: Normal muscle strength upper and lower extremities bilaterally.  Neurological: No focal abnormalities.  Psychiatric: She is mildly anxious  Lab Results: Basic Metabolic Panel: Recent Labs    10/28/17 1702  NA 137  K 3.3*  CL 99*  CO2 26  GLUCOSE 147*  BUN 19  CREATININE 1.29*  CALCIUM 8.5*   Liver Function Tests: Recent Labs    10/28/17 1702  AST 19  ALT 15  ALKPHOS 112  BILITOT 0.5  PROT 6.9  ALBUMIN 3.3*   No results for  input(s): LIPASE, AMYLASE in the last 72 hours. No results for input(s): AMMONIA in the last 72 hours. CBC: Recent Labs    10/28/17 1702  WBC 4.8  NEUTROABS 3.2  HGB 11.4*  HCT 38.8  MCV 86.2  PLT 249   Cardiac Enzymes: Recent Labs    10/28/17 1702  TROPONINI <0.03   BNP: No results for input(s): PROBNP in the last 72 hours. D-Dimer: No results for input(s): DDIMER in the last 72 hours. CBG: No results for input(s): GLUCAP in the last 72 hours. Hemoglobin A1C: No results for input(s): HGBA1C in the last 72 hours. Fasting Lipid Panel: No results for input(s): CHOL, HDL, LDLCALC, TRIG, CHOLHDL, LDLDIRECT in the last 72 hours. Thyroid Function Tests: No results for input(s): TSH, T4TOTAL, FREET4, T3FREE, THYROIDAB in the last 72 hours. Anemia Panel: No results for input(s): VITAMINB12, FOLATE, FERRITIN, TIBC, IRON, RETICCTPCT in the last 72 hours. Coagulation: No results for input(s): LABPROT, INR in the last 72 hours. Urine Drug Screen: Drugs of Abuse  No results found for: LABOPIA, COCAINSCRNUR, LABBENZ, AMPHETMU, THCU, LABBARB  Alcohol Level: No results for input(s): ETH in the last 72 hours. Urinalysis: No results for input(s): COLORURINE, LABSPEC, PHURINE, GLUCOSEU, HGBUR, BILIRUBINUR, KETONESUR, PROTEINUR, UROBILINOGEN, NITRITE, LEUKOCYTESUR in the last 72 hours.  Invalid input(s): APPERANCEUR Misc. Labs:   ABGS: No results for input(s): PHART, PO2ART, TCO2, HCO3 in the last 72 hours.  Invalid input(s): PCO2   MICROBIOLOGY: Recent Results (from the past 240 hour(s))  Blood culture (routine x 2)     Status: None (Preliminary result)   Collection Time: 10/28/17  5:29 PM  Result Value Ref Range Status   Specimen Description LEFT ANTECUBITAL  Final   Special Requests   Final    ANAEROBIC BOTTLE ONLY Blood Culture adequate volume   Culture   Final    NO GROWTH < 24 HOURS Performed at [facility], [address] [place], [place] [address]    Report  Status PENDING  Incomplete  Blood culture (routine x 2)     Status: None (Preliminary result)   Collection Time: 10/28/17  5:29 PM  Result Value Ref Range Status   Specimen Description RIGHT ANTECUBITAL  Final   Special Requests   Final    BOTTLES DRAWN AEROBIC AND ANAEROBIC Blood Culture adequate volume   Culture   Final    NO GROWTH < 24 HOURS Performed at [facility], [address] [place], [place] [address]    Report  Status PENDING  Incomplete  Culture, sputum-assessment     Status: None   Collection Time: 10/28/17 10:24 PM  Result Value Ref Range Status   Specimen Description EXPECTORATED SPUTUM  Final   Special Requests NONE  Final   Sputum evaluation   Final    Sputum specimen not acceptable for testing.  Please recollect.   SPOKE WITH CICELY, RN @ [phone] ON 3.18.19 Performed at [facility], [address] [place], [place] [address]    Report Status 10/28/2017 FINAL  Final    Studies/Results: Ct Chest W Contrast  Result Date: 10/28/2017 CLINICAL DATA:  Cough and shortness of breath for 2 weeks, abnormal chest x-ray from earlier today EXAM: CT CHEST WITH CONTRAST TECHNIQUE: Multidetector CT imaging of the chest was performed during intravenous contrast administration. CONTRAST:  77mL ISOVUE-300 IOPAMIDOL (ISOVUE-300) INJECTION 61% COMPARISON:  Chest x-ray from earlier today, CT of the chest from 02/19/2017 FINDINGS: Cardiovascular: Thoracic aorta demonstrates atherosclerotic calcifications. No aneurysmal dilatation or dissection is seen. The pulmonary artery is well visualize without evidence of filling defect to suggest pulmonary embolism. Heart is not significantly enlarged. No significant coronary calcifications are seen. Mediastinum/Nodes: The esophagus is within normal limits. Thoracic inlet is unremarkable. Scattered small mediastinal lymph nodes are noted. The largest of these lies along the lateral aspect of the aortic arch measuring approximately 8 mm. In the right hilum  tiny lymph nodes are seen not significant by size criteria. In the left hilum, there is a 3.4 x 2.9 cm soft tissue mass lesion identified. Lungs/Pleura: Multiple smaller soft tissue masses are noted throughout the left upper lobe, the largest of which measures 16 mm. These changes are most consistent with pulmonary neoplasm. Scattered changes are noted in the subpleural region of the right lower lobe best seen on image number 92 of series 4. These are likely postinflammatory in nature. No sizable effusion or pneumothorax is noted. Upper Abdomen: Diffuse fatty infiltration of the liver is noted. Gallbladder has been surgically removed. Musculoskeletal: No acute bony abnormality noted. IMPRESSION: Scattered soft tissue mass lesions throughout the left upper lobe with associated left hilar mass most consistent with pulmonary neoplasm till proven otherwise. Further evaluation by means of bronchoscopic biopsy and PET-CT are recommended. Scattered changes in the right lower lobe likely postinflammatory in nature. Electronically Signed   By: Inez Catalina M.D.   On: 10/28/2017 19:14   Dg Chest Port 1 View  Result Date: 10/28/2017 CLINICAL DATA:  Cough, SOB, wheezing x1 week. Hx of COPD, Former smoker- quit 1 month ago. EXAM: PORTABLE CHEST 1 VIEW COMPARISON:  Chest x-ray dated 06/28/2014. Chest CT dated 02/19/2017. FINDINGS: Left upper lobe opacity, extending towards the left hilum, somewhat masslike in appearance at the left hilum. Right lung is clear. Heart size and mediastinal contours are stable. No pleural effusion seen. No acute or suspicious osseous finding. IMPRESSION: Left upper lobe opacity, extending towards the left hilum, somewhat masslike in appearance at the left hilum. Findings could certainly represent pneumonia with perihilar extension and/or pneumonia with reactive perihilar lymphadenopathy. However, given the appearance and patient's smoking history, I am concerned this could represent a perihilar  mass with postobstructive pneumonia or atelectasis. Recommend chest CT for further characterization. Electronically Signed   By: Franki Cabot M.D.   On: 10/28/2017 17:29    Medications:  Prior to Admission:  Medications Prior to Admission  Medication Sig Dispense Refill Last Dose  . albuterol (PROAIR HFA) 108 (90 Base) MCG/ACT inhaler UWSE 2 PUFFS EVERY 6  HOURS AS NEEDED FOR SHORTNESS OF BREATH. 8.5 g 2 10/28/2017 at Unknown time  . albuterol (PROVENTIL) (2.5 MG/3ML) 0.083% nebulizer solution INHALE ONE VIAL VIA NEBULIZER EVERY 6 HOURS AS NEEDED. 375 mL 0 10/28/2017 at Unknown time  . ALPRAZolam (XANAX) 1 MG tablet take 1 tablet by mouth twice a day 60 tablet 2 10/28/2017 at Unknown time  . aspirin (ASPIRIN LOW DOSE) 81 MG EC tablet Take 81 mg by mouth daily.     10/28/2017 at Unknown time  . budesonide-formoterol (SYMBICORT) 80-4.5 MCG/ACT inhaler Inhale 2 puffs into the lungs 2 (two) times daily. 1 Inhaler 3 10/28/2017 at Unknown time  . celecoxib (CELEBREX) 400 MG capsule Take 1 capsule (400 mg total) by mouth daily. 30 capsule 3 10/28/2017 at Unknown time  . diltiazem (CARDIZEM CD) 120 MG 24 hr capsule Take 1 capsule (120 mg total) by mouth daily. 90 capsule 1 10/28/2017 at Unknown time  . FLUoxetine (PROZAC) 20 MG capsule Take 1 capsule (20 mg total) by mouth daily. 90 capsule 1 10/28/2017 at Unknown time  . furosemide (LASIX) 20 MG tablet Take 1 tablet (20 mg total) by mouth 2 (two) times daily. 180 tablet 1 10/28/2017 at Unknown time  . HYDROcodone-acetaminophen (NORCO/VICODIN) 5-325 MG tablet TAKE ONE TABLET THREE TIMES A DAY 90 tablet 0 10/28/2017 at Unknown time  . lisinopril (PRINIVIL,ZESTRIL) 10 MG tablet Take 1 tablet (10 mg total) by mouth daily. 90 tablet 3 10/28/2017 at Unknown time  . omeprazole (PRILOSEC) 40 MG capsule Take 1 capsule (40 mg total) by mouth daily. 90 capsule 1 10/27/2017 at Unknown time  . potassium chloride SA (K-DUR,KLOR-CON) 20 MEQ tablet Take 1 tablet (20 mEq total) by  mouth daily. 90 tablet 1 10/28/2017 at Unknown time  . rosuvastatin (CRESTOR) 20 MG tablet take 1 tablet by mouth once daily 30 tablet 5 10/27/2017 at Unknown time  . tiZANidine (ZANAFLEX) 4 MG tablet Take 1 tablet (4 mg total) by mouth 3 (three) times daily. 270 tablet 1 10/28/2017 at Unknown time   Scheduled: . ALPRAZolam  1 mg Oral BID  . chlorhexidine  15 mL Mouth Rinse BID  . diltiazem  120 mg Oral Daily  . enoxaparin (LOVENOX) injection  40 mg Subcutaneous Q24H  . FLUoxetine  20 mg Oral Daily  . furosemide  20 mg Oral BID  . guaiFENesin  600 mg Oral BID  . HYDROcodone-acetaminophen  1 tablet Oral Q8H  . ipratropium-albuterol  3 mL Nebulization Q6H  . mouth rinse  15 mL Mouth Rinse q12n4p  . mometasone-formoterol  2 puff Inhalation BID  . oseltamivir  30 mg Oral BID  . pantoprazole  40 mg Oral Daily  . predniSONE  50 mg Oral Q breakfast  . rosuvastatin  20 mg Oral Daily  . sodium chloride flush  3 mL Intravenous Q12H  . tiZANidine  4 mg Oral TID   Continuous: . sodium chloride    . [START ON 10/30/2017] levofloxacin (LEVAQUIN) IV    . sodium chloride 1,000 mL (10/29/17 0725)   ZOX:WRUEAV chloride, albuterol, sodium chloride flush  Assesment: She is admitted with COPD exacerbation and a lung mass.  She is positive for influenza A.  Her lung mass looks like it could be primary lung cancer but she did have CT of her chest done in July of last year that did not show the hilar abnormality. Principal Problem:   COPD with acute exacerbation ([place]) Active Problems:   Hyperlipidemia LDL goal <100   Morbid  obesity ([place])   Essential hypertension   GERD   Emphysema lung ([facility])   AKI (acute kidney injury) ([place])   COPD exacerbation ([place])    Plan: For bronchoscopy in the morning.  I do not see a definite endobronchial lesion so we may not get a diagnosis.  I discussed that at length with her and she wants to proceed    LOS: 0 days   Berania Peedin L 10/29/2017, 8:56 AM

## 2017-10-29 NOTE — Progress Notes (Signed)
New order for Tamiflu per Dr. Olevia Bowens- Pharmacy called this nurse and stated d/t pts renal function Dr. Olevia Bowens needed to Order Tamiflu 30mg  BID x5 days. Dr. Olevia Bowens paged and made aware.

## 2017-10-29 NOTE — Care Management Note (Signed)
Case Management Note  Patient Details  Name: JAMILYA SARRAZIN MRN: [id number] Date of Birth: 1945-09-21  Subjective/Objective: Adm with PNA, flu.  CT findings of hilar mass, needing bronchoscopy. From home with husband, ind with ADL's. Acutely on oxygen.              Action/Plan: Anticipate DC home with self care. CM will follow.   Expected Discharge Date:    unk              Expected Discharge Plan:     In-House Referral:     Discharge planning Services  CM Consult  Post Acute Care Choice:    Choice offered to:     DME Arranged:    DME Agency:     HH Arranged:    HH Agency:     Status of Service:  In process, will continue to follow  If discussed at Long Length of Stay Meetings, dates discussed:    Additional Comments:  Gaige Sebo, Chauncey [place], RN 10/29/2017, 12:04 PM

## 2017-10-30 ENCOUNTER — Telehealth: Payer: Self-pay | Admitting: Family Medicine

## 2017-10-30 ENCOUNTER — Encounter (HOSPITAL_COMMUNITY): Payer: Self-pay | Admitting: *Deleted

## 2017-10-30 ENCOUNTER — Encounter (HOSPITAL_COMMUNITY)
Admission: EM | Disposition: A | Discharge status: Home / Self Care | Payer: Self-pay | Source: Home / Self Care | Attending: Family Medicine

## 2017-10-30 ENCOUNTER — Inpatient Hospital Stay (HOSPITAL_COMMUNITY): Payer: PPO | Admitting: Anesthesiology

## 2017-10-30 DIAGNOSIS — R918 Other nonspecific abnormal finding of lung field: Secondary | ICD-10-CM | POA: Diagnosis not present

## 2017-10-30 HISTORY — PX: BRONCHIAL WASHINGS: SHX5105

## 2017-10-30 HISTORY — PX: BRONCHIAL BRUSHINGS: SHX5108

## 2017-10-30 HISTORY — PX: FLEXIBLE BRONCHOSCOPY: SHX5094

## 2017-10-30 LAB — BASIC METABOLIC PANEL
ANION GAP: 14 (ref 5–15)
BUN: 15 mg/dL (ref 6–20)
CALCIUM: 8.5 mg/dL — AB (ref 8.9–10.3)
CO2: 25 mmol/L (ref 22–32)
Chloride: 98 mmol/L — ABNORMAL LOW (ref 101–111)
Creatinine, Ser: 0.72 mg/dL (ref 0.44–1.00)
GFR calc Af Amer: >60 mL/min (ref >60)
GFR calc non Af Amer: >60 mL/min (ref >60)
GLUCOSE: 178 mg/dL — AB (ref 65–99)
Potassium: 3.5 mmol/L (ref 3.5–5.1)
Sodium: 137 mmol/L (ref 135–145)

## 2017-10-30 LAB — CBC WITH DIFFERENTIAL/PLATELET
BASOS PCT: 0 %
Basophils Absolute: 0 10*3/uL (ref 0.0–0.1)
Eosinophils Absolute: 0 10*3/uL (ref 0.0–0.7)
Eosinophils Relative: 0 %
HEMATOCRIT: 37.3 % (ref 36.0–46.0)
Hemoglobin: 10.9 g/dL — ABNORMAL LOW (ref 12.0–15.0)
Lymphocytes Relative: 11 %
Lymphs Abs: 0.7 10*3/uL (ref 0.7–4.0)
MCH: 25.2 pg — ABNORMAL LOW (ref 26.0–34.0)
MCHC: 29.2 g/dL — AB (ref 30.0–36.0)
MCV: 86.3 fL (ref 78.0–100.0)
MONO ABS: 0.6 10*3/uL (ref 0.1–1.0)
MONOS PCT: 9 %
NEUTROS ABS: 5.2 10*3/uL (ref 1.7–7.7)
Neutrophils Relative %: 80 %
Platelets: 253 10*3/uL (ref 150–400)
RBC: 4.32 MIL/uL (ref 3.87–5.11)
RDW: 16.3 % — AB (ref 11.5–15.5)
WBC: 6.4 10*3/uL (ref 4.0–10.5)

## 2017-10-30 LAB — SURGICAL PCR SCREEN
MRSA, PCR: NEGATIVE
Staphylococcus aureus: NEGATIVE

## 2017-10-30 LAB — HIV ANTIBODY (ROUTINE TESTING W REFLEX): HIV SCREEN 4TH GENERATION: NONREACTIVE

## 2017-10-30 LAB — GLUCOSE, CAPILLARY: GLUCOSE-CAPILLARY: 113 mg/dL — AB (ref 65–99)

## 2017-10-30 SURGERY — BRONCHOSCOPY, FLEXIBLE
Anesthesia: Monitor Anesthesia Care

## 2017-10-30 SURGERY — BRONCHOSCOPY, FLEXIBLE
Anesthesia: Monitor Anesthesia Care | Laterality: Left

## 2017-10-30 MED ORDER — GLYCOPYRROLATE 0.2 MG/ML IJ SOLN
INTRAMUSCULAR | Status: AC
  Filled 2017-10-30: qty 1

## 2017-10-30 MED ORDER — MIDAZOLAM HCL 2 MG/2ML IJ SOLN
INTRAMUSCULAR | Status: AC
  Filled 2017-10-30: qty 2

## 2017-10-30 MED ORDER — LIDOCAINE VISCOUS 2 % MT SOLN
OROMUCOSAL | Status: AC
  Filled 2017-10-30: qty 15

## 2017-10-30 MED ORDER — LACTATED RINGERS IV SOLN
INTRAVENOUS | Status: DC
  Administered 2017-10-30: 1000 mL via INTRAVENOUS

## 2017-10-30 MED ORDER — GLYCOPYRROLATE 0.2 MG/ML IJ SOLN
0.2000 mg | Freq: Once | INTRAMUSCULAR | Status: AC | PRN
  Administered 2017-10-30: 0.2 mg via INTRAVENOUS

## 2017-10-30 MED ORDER — EPHEDRINE SULFATE 50 MG/ML IJ SOLN
INTRAMUSCULAR | Status: AC
  Filled 2017-10-30: qty 1

## 2017-10-30 MED ORDER — PROPOFOL 10 MG/ML IV BOLUS
INTRAVENOUS | Status: AC
  Filled 2017-10-30: qty 60

## 2017-10-30 MED ORDER — LIDOCAINE HCL (PF) 2 % IJ SOLN
INTRAMUSCULAR | Status: DC | PRN
  Administered 2017-10-30: 5 mL via INTRADERMAL

## 2017-10-30 MED ORDER — PROPOFOL 500 MG/50ML IV EMUL
INTRAVENOUS | Status: DC | PRN
  Administered 2017-10-30: 08:00:00 via INTRAVENOUS
  Administered 2017-10-30: 150 ug/kg/min via INTRAVENOUS

## 2017-10-30 MED ORDER — POTASSIUM CHLORIDE CRYS ER 20 MEQ PO TBCR: 60.0000 meq | EXTENDED_RELEASE_TABLET | Freq: Once | ORAL | Status: DC

## 2017-10-30 MED ORDER — FENTANYL CITRATE (PF) 100 MCG/2ML IJ SOLN
25.0000 ug | Freq: Once | INTRAMUSCULAR | Status: AC
  Administered 2017-10-30: 25 ug via INTRAVENOUS

## 2017-10-30 MED ORDER — FENTANYL CITRATE (PF) 100 MCG/2ML IJ SOLN
INTRAMUSCULAR | Status: AC
  Filled 2017-10-30: qty 2

## 2017-10-30 MED ORDER — LIDOCAINE VISCOUS 2 % MT SOLN
OROMUCOSAL | Status: DC | PRN
  Administered 2017-10-30: 1 via OROMUCOSAL
  Administered 2017-10-30: 5 mL via OROMUCOSAL

## 2017-10-30 MED ORDER — MUPIROCIN 2 % EX OINT: 1.0000 "application " | TOPICAL_OINTMENT | Freq: Two times a day (BID) | CUTANEOUS | Status: DC

## 2017-10-30 MED ORDER — POTASSIUM CHLORIDE CRYS ER 20 MEQ PO TBCR
40.0000 meq | EXTENDED_RELEASE_TABLET | Freq: Once | ORAL | Status: AC
  Administered 2017-10-30: 40 meq via ORAL
  Filled 2017-10-30: qty 2

## 2017-10-30 MED ORDER — ATROPINE SULFATE 0.4 MG/ML IJ SOLN
INTRAMUSCULAR | Status: AC
  Filled 2017-10-30: qty 1

## 2017-10-30 MED ORDER — LIDOCAINE VISCOUS 2 % MT SOLN
6.0000 mL | Freq: Once | OROMUCOSAL | Status: AC
  Administered 2017-10-30: 6 mL via OROMUCOSAL

## 2017-10-30 MED ORDER — DEXAMETHASONE SODIUM PHOSPHATE 4 MG/ML IJ SOLN
4.0000 mg | INTRAMUSCULAR | Status: AC
  Administered 2017-10-30: 4 mg via INTRAVENOUS

## 2017-10-30 MED ORDER — DEXAMETHASONE SODIUM PHOSPHATE 4 MG/ML IJ SOLN
INTRAMUSCULAR | Status: AC
  Filled 2017-10-30: qty 1

## 2017-10-30 MED ORDER — SODIUM CHLORIDE 0.9 % IJ SOLN
INTRAMUSCULAR | Status: AC
  Filled 2017-10-30: qty 10

## 2017-10-30 MED ORDER — SUCCINYLCHOLINE CHLORIDE 20 MG/ML IJ SOLN
INTRAMUSCULAR | Status: AC
  Filled 2017-10-30: qty 1

## 2017-10-30 MED ORDER — LIDOCAINE HCL (PF) 2 % IJ SOLN
INTRAMUSCULAR | Status: AC
  Filled 2017-10-30: qty 20

## 2017-10-30 MED ORDER — MIDAZOLAM HCL 2 MG/2ML IJ SOLN
1.0000 mg | INTRAMUSCULAR | Status: AC
  Administered 2017-10-30: 2 mg via INTRAVENOUS

## 2017-10-30 MED ORDER — 0.9 % SODIUM CHLORIDE (POUR BTL) OPTIME
TOPICAL | Status: DC | PRN
  Administered 2017-10-30: 20 mL

## 2017-10-30 SURGICAL SUPPLY — 16 items
BRUSH CYTOL CELLEBRITY 1.5X140 (MISCELLANEOUS) ×4 IMPLANT
CLOTH BEACON ORANGE TIMEOUT ST (SAFETY) ×4 IMPLANT
CONNECTOR 5 IN 1 STRAIGHT STRL (MISCELLANEOUS) ×4 IMPLANT
FORCEPS BIOP RJ4 1.8 (CUTTING FORCEPS) ×4 IMPLANT
GLOVE BIO SURGEON STRL SZ7.5 (GLOVE) ×4 IMPLANT
KIT CLEAN CATCH URINE (SET/KITS/TRAYS/PACK) IMPLANT
MARKER SKIN DUAL TIP RULER LAB (MISCELLANEOUS) ×4 IMPLANT
NS IRRIG 1000ML POUR BTL (IV SOLUTION) ×4 IMPLANT
SPONGE GAUZE 4X4 12PLY (GAUZE/BANDAGES/DRESSINGS) ×4 IMPLANT
SYR 20CC LL (SYRINGE) ×4 IMPLANT
SYR 30ML LL (SYRINGE) ×4 IMPLANT
SYR CONTROL 10ML LL (SYRINGE) ×4 IMPLANT
TRAP SPECIMEN CP (MISCELLANEOUS) ×4 IMPLANT
VALVE DISPOSABLE (MISCELLANEOUS) ×4 IMPLANT
WATER STERILE IRR 1000ML POUR (IV SOLUTION) ×4 IMPLANT
YANKAUER SUCT BULB TIP 10FT TU (MISCELLANEOUS) ×8 IMPLANT

## 2017-10-30 NOTE — Progress Notes (Signed)
PROGRESS NOTE    [name]  FVC:[id number]  DOB: [birth year]  DOA: 10/28/2017 PCP: Fayrene Helper, MD  Brief Admission Hx: [name] is a 72 y.o. female with medical history of COPD, HTN, Obesity, chronic back pain who presents from PCP's office who had visit planned with her today but sent her to the ER due to respiratory distress. The patient states she had tightness in her chest, shortness of breath and a cough with yellow mucous. She was given antibiotics for an acute bronchitis by Dr Meda Coffee (at Dr [facility] office) about 1-2 weeks ago. She was also given a steroid shot in the office and then 2 different cough medications. She stopped the antibiotic after a few days of use as she felt better. She has not had a fever, sinus or nasal drainage or sore throat. She feels better after treatment in the ER.  She had a CT chest that was suspicious for a primary lung cancer.    MDM/Assessment & Plan:   1. COPD with acute exacerbation - continue current therapy with nebs, steroids, antibiotics. Ambulatory pulse ox in the AM.    2. AKI - RESOLVED? prerenal treating with IVFs.  3. Hypokalemia - repleted. 4. Chronic back pain - resume home hydrocodone and tizanidine 5. Essential hypertension - holding lisinopril due to AKI, resumed home cardizem.  6. Hyperlipidemia - continue statin daily. 7. Pulmonary Mass / Question of primary lung cancer - Pulmonology consulted s/p bronchoscopy 3/20, biopsies pending. Will need to follow up with oncology if positive for carcinoma.    DVT prophylaxis: lovenox Code Status: Full  Family Communication: patient  Disposition Plan: pending further clinical improvement, hopefully in 1-2 days could discharge home  Consultants:  pulmonology  Procedures:  Bronchoscopy (3/20)  Subjective: Pt tolerated her bronch well and feeling better, breathing better.    Objective: Vitals:   10/30/17 0815 10/30/17 0830 10/30/17 1120 10/30/17 1441  BP: 124/82  (!) 142/74    Pulse: 95 93    Resp: (!) 21 (!) 26    Temp:      TempSrc:      SpO2: 100% 95% 96% 93%  Weight:      Height:        Intake/Output Summary (Last 24 hours) at 10/30/2017 1535 Last data filed at 10/30/2017 0809 Gross per 24 hour  Intake 1130 ml  Output 0 ml  Net 1130 ml   Filed Weights   10/28/17 1629 10/28/17 2312  Weight: 104.3 kg (230 lb) 103.3 kg (227 lb 11.8 oz)     REVIEW OF SYSTEMS  As per history otherwise all reviewed and reported negative  Exam:  General exam: awake, alert, NAD, cooperative.  Respiratory system: improved exp wheezing. No increased work of breathing. Cardiovascular system: S1 & S2 heard, RRR. No JVD, murmurs, gallops, clicks or pedal edema. Gastrointestinal system: Abdomen is nondistended, soft and nontender. Normal bowel sounds heard. Central nervous system: Alert and oriented. No focal neurological deficits. Extremities: no CCE.  Data Reviewed: Basic Metabolic Panel: Recent Labs  Lab 10/28/17 1702 10/29/17 1040 10/30/17 0522  NA 137 138 137  K 3.3* 4.0 3.5  CL 99* 100* 98*  CO2 26 22 25   GLUCOSE 147* 244* 178*  BUN 19 14 15   CREATININE 1.29* 0.79 0.72  CALCIUM 8.5* 8.2* 8.5*   Liver Function Tests: Recent Labs  Lab 10/28/17 1702  AST 19  ALT 15  ALKPHOS 112  BILITOT 0.5  PROT 6.9  ALBUMIN 3.3*  No results for input(s): LIPASE, AMYLASE in the last 168 hours. No results for input(s): AMMONIA in the last 168 hours. CBC: Recent Labs  Lab 10/28/17 1702 10/30/17 0522  WBC 4.8 6.4  NEUTROABS 3.2 5.2  HGB 11.4* 10.9*  HCT 38.8 37.3  MCV 86.2 86.3  PLT 249 253   Cardiac Enzymes: Recent Labs  Lab 10/28/17 1702  TROPONINI <0.03   CBG (last 3)  Recent Labs    10/30/17 0713  GLUCAP 113*   Recent Results (from the past 240 hour(s))  Blood culture (routine x 2)     Status: None (Preliminary result)   Collection Time: 10/28/17  5:29 PM  Result Value Ref Range Status   Specimen Description LEFT  ANTECUBITAL  Final   Special Requests   Final    ANAEROBIC BOTTLE ONLY Blood Culture adequate volume   Culture   Final    NO GROWTH 2 DAYS Performed at [facility], [address] [place], [place] [address]    Report Status PENDING  Incomplete  Blood culture (routine x 2)     Status: None (Preliminary result)   Collection Time: 10/28/17  5:29 PM  Result Value Ref Range Status   Specimen Description RIGHT ANTECUBITAL  Final   Special Requests   Final    BOTTLES DRAWN AEROBIC AND ANAEROBIC Blood Culture adequate volume   Culture   Final    NO GROWTH 2 DAYS Performed at [facility], [address], [place], [place] [address]    Report Status PENDING  Incomplete  Respiratory Panel by PCR     Status: Abnormal   Collection Time: 10/28/17  9:00 PM  Result Value Ref Range Status   Adenovirus NOT DETECTED NOT DETECTED Final   Coronavirus 229E NOT DETECTED NOT DETECTED Final   Coronavirus HKU1 NOT DETECTED NOT DETECTED Final   Coronavirus NL63 NOT DETECTED NOT DETECTED Final   Coronavirus OC43 NOT DETECTED NOT DETECTED Final   Metapneumovirus NOT DETECTED NOT DETECTED Final   Rhinovirus / Enterovirus NOT DETECTED NOT DETECTED Final   Influenza A H1 2009 DETECTED (A) NOT DETECTED Final   Influenza B NOT DETECTED NOT DETECTED Final   Parainfluenza Virus 1 NOT DETECTED NOT DETECTED Final   Parainfluenza Virus 2 NOT DETECTED NOT DETECTED Final   Parainfluenza Virus 3 NOT DETECTED NOT DETECTED Final   Parainfluenza Virus 4 NOT DETECTED NOT DETECTED Final   Respiratory Syncytial Virus NOT DETECTED NOT DETECTED Final   Bordetella pertussis NOT DETECTED NOT DETECTED Final   Chlamydophila pneumoniae NOT DETECTED NOT DETECTED Final   Mycoplasma pneumoniae NOT DETECTED NOT DETECTED Final    Comment: Performed at [facility], [place] [address] [place], [place] [address]  Culture, sputum-assessment     Status: None   Collection Time: 10/28/17 10:24 PM  Result Value Ref Range Status    Specimen Description EXPECTORATED SPUTUM  Final   Special Requests NONE  Final   Sputum evaluation   Final    Sputum specimen not acceptable for testing.  Please recollect.   SPOKE WITH CICELY, RN @ [phone] ON 3.18.19 Performed at [facility], [address] [place], [place] [address]    Report Status 10/28/2017 FINAL  Final  Surgical PCR screen     Status: None   Collection Time: 10/30/17  3:00 AM  Result Value Ref Range Status   MRSA, PCR NEGATIVE NEGATIVE Final   Staphylococcus aureus NEGATIVE NEGATIVE Final    Comment: (NOTE) The Xpert SA Assay (  FDA approved for NASAL specimens in patients 45 years of age and older), is one component of a comprehensive surveillance program. It is not intended to diagnose infection nor to guide or monitor treatment. Performed at [facility], [address], [place], [place] [address]      Studies: Ct Chest W Contrast  Result Date: 10/28/2017 CLINICAL DATA:  Cough and shortness of breath for 2 weeks, abnormal chest x-ray from earlier today EXAM: CT CHEST WITH CONTRAST TECHNIQUE: Multidetector CT imaging of the chest was performed during intravenous contrast administration. CONTRAST:  55mL ISOVUE-300 IOPAMIDOL (ISOVUE-300) INJECTION 61% COMPARISON:  Chest x-ray from earlier today, CT of the chest from 02/19/2017 FINDINGS: Cardiovascular: Thoracic aorta demonstrates atherosclerotic calcifications. No aneurysmal dilatation or dissection is seen. The pulmonary artery is well visualize without evidence of filling defect to suggest pulmonary embolism. Heart is not significantly enlarged. No significant coronary calcifications are seen. Mediastinum/Nodes: The esophagus is within normal limits. Thoracic inlet is unremarkable. Scattered small mediastinal lymph nodes are noted. The largest of these lies along the lateral aspect of the aortic arch measuring approximately 8 mm. In the right hilum tiny lymph nodes are seen not significant by size criteria. In the left  hilum, there is a 3.4 x 2.9 cm soft tissue mass lesion identified. Lungs/Pleura: Multiple smaller soft tissue masses are noted throughout the left upper lobe, the largest of which measures 16 mm. These changes are most consistent with pulmonary neoplasm. Scattered changes are noted in the subpleural region of the right lower lobe best seen on image number 92 of series 4. These are likely postinflammatory in nature. No sizable effusion or pneumothorax is noted. Upper Abdomen: Diffuse fatty infiltration of the liver is noted. Gallbladder has been surgically removed. Musculoskeletal: No acute bony abnormality noted. IMPRESSION: Scattered soft tissue mass lesions throughout the left upper lobe with associated left hilar mass most consistent with pulmonary neoplasm till proven otherwise. Further evaluation by means of bronchoscopic biopsy and PET-CT are recommended. Scattered changes in the right lower lobe likely postinflammatory in nature. Electronically Signed   By: Inez Catalina M.D.   On: 10/28/2017 19:14   Dg Chest Port 1 View  Result Date: 10/28/2017 CLINICAL DATA:  Cough, SOB, wheezing x1 week. Hx of COPD, Former smoker- quit 1 month ago. EXAM: PORTABLE CHEST 1 VIEW COMPARISON:  Chest x-ray dated 06/28/2014. Chest CT dated 02/19/2017. FINDINGS: Left upper lobe opacity, extending towards the left hilum, somewhat masslike in appearance at the left hilum. Right lung is clear. Heart size and mediastinal contours are stable. No pleural effusion seen. No acute or suspicious osseous finding. IMPRESSION: Left upper lobe opacity, extending towards the left hilum, somewhat masslike in appearance at the left hilum. Findings could certainly represent pneumonia with perihilar extension and/or pneumonia with reactive perihilar lymphadenopathy. However, given the appearance and patient's smoking history, I am concerned this could represent a perihilar mass with postobstructive pneumonia or atelectasis. Recommend chest CT for  further characterization. Electronically Signed   By: Franki Cabot M.D.   On: 10/28/2017 17:29   Scheduled Meds: . ALPRAZolam  1 mg Oral BID  . chlorhexidine  15 mL Mouth Rinse BID  . diltiazem  120 mg Oral Daily  . enoxaparin (LOVENOX) injection  40 mg Subcutaneous Q24H  . FLUoxetine  20 mg Oral Daily  . furosemide  20 mg Oral BID  . guaiFENesin  600 mg Oral BID  . HYDROcodone-acetaminophen  1 tablet Oral Q8H  . ipratropium-albuterol  3 mL Nebulization Q6H  .  mouth rinse  15 mL Mouth Rinse q12n4p  . mometasone-formoterol  2 puff Inhalation BID  . oseltamivir  30 mg Oral BID  . pantoprazole  40 mg Oral Daily  . predniSONE  50 mg Oral Q breakfast  . rosuvastatin  20 mg Oral QHS  . sodium chloride flush  3 mL Intravenous Q12H  . tiZANidine  4 mg Oral TID   Continuous Infusions: . sodium chloride    . levofloxacin (LEVAQUIN) IV 750 mg (10/29/17 1719)    Principal Problem:   COPD with acute exacerbation ([place]) Active Problems:   Mass of upper lobe of left lung   Hyperlipidemia LDL goal <100   Morbid obesity ([facility])   Essential hypertension   GERD   Emphysema lung (H[facility])   AKI (acute kidney injury) ([place])   COPD exacerbation ([facility])   COPD (chronic obstructive pulmonary disease) ([place])  Time spent:   Irwin Brakeman, MD, FAAFP [facility] Pager [phone]  If 7PM-7AM, please contact night-coverage [email or web address] Password [email or web address] 10/30/2017, 3:35 PM    LOS: 1 day

## 2017-10-30 NOTE — Op Note (Signed)
Bronchoscopy Procedure Note [name] [id number] 1946-05-13  Procedure: Bronchoscopy Indications: Diagnostic evaluation of the airways and Obtain specimens for culture and/or other diagnostic studies  Procedure Details Consent: Risks of procedure as well as the alternatives and risks of each were explained to the (patient/caregiver).  Consent for procedure obtained. Time Out: Verified patient identification, verified procedure, site/side was marked, verified correct patient position, special equipment/implants available, medications/allergies/relevent history reviewed, required imaging and test results available.  Performed  In preparation for procedure, bronchoscope lubricated. Sedation: Propofol  Airway entered and the following bronchi were examined: RUL, RML, RLL, LUL, LLL and Bronchi.  There was a mass lesion in the left upper lobe Procedures performed: Brushings performed biopsies of mass lesion left upper lobe and washings Bronchoscope removed.    Evaluation Hemodynamic Status: BP stable throughout; O2 sats: stable throughout Patient's Current Condition: stable Specimens:  Sent serosanguinous fluid brush, biopsies complications: No apparent complications Patient did tolerate procedure well.   [name] 10/30/2017

## 2017-10-30 NOTE — Transfer of Care (Signed)
Immediate Anesthesia Transfer of Care Note  Patient: [name]  Procedure(s) Performed: FLEXIBLE BRONCHOSCOPY (Bilateral )  Patient Location: PACU  Anesthesia Type:MAC  Level of Consciousness: awake, alert , oriented and patient cooperative  Airway & Oxygen Therapy: Patient Spontanous Breathing and Patient connected to face mask oxygen  Post-op Assessment: Report given to RN and Post -op Vital signs reviewed and stable  Post vital signs: Reviewed and stable  Last Vitals:  Vitals:   10/30/17 0256 10/30/17 0451  BP:  121/64  Pulse:  88  Resp:    Temp:  36.8 C  SpO2: 95% 96%    Last Pain:  Vitals:   10/30/17 0451  TempSrc: Oral  PainSc:          Complications: No apparent anesthesia complications

## 2017-10-30 NOTE — Telephone Encounter (Signed)
mammmogram scheduled. Referral updated.

## 2017-10-30 NOTE — Progress Notes (Signed)
Pt off floor via w/c with pre-op nurse.

## 2017-10-30 NOTE — Anesthesia Postprocedure Evaluation (Signed)
Anesthesia Post Note  Patient: [name]  Procedure(s) Performed: FLEXIBLE BRONCHOSCOPY (Bilateral )  Patient location during evaluation: PACU Anesthesia Type: MAC Level of consciousness: awake and alert, oriented and patient cooperative Pain management: pain level controlled Vital Signs Assessment: post-procedure vital signs reviewed and stable Respiratory status: spontaneous breathing, respiratory function stable and patient connected to face mask oxygen Cardiovascular status: stable Postop Assessment: no apparent nausea or vomiting Anesthetic complications: no     Last Vitals:  Vitals:   10/30/17 0256 10/30/17 0451  BP:  121/64  Pulse:  88  Resp:    Temp:  36.8 C  SpO2: 95% 96%    Last Pain:  Vitals:   10/30/17 0451  TempSrc: Oral  PainSc:                  ADAMS, AMY A

## 2017-10-30 NOTE — Anesthesia Preprocedure Evaluation (Signed)
Anesthesia Evaluation  Patient identified by MRN, date of birth, ID band Patient awake    Reviewed: Allergy & Precautions, NPO status , Patient's Chart, lab work & pertinent test results  Airway Mallampati: II  TM Distance: >3 FB Neck ROM: Full    Dental  (+) Poor Dentition, Missing   Pulmonary shortness of breath, COPD,  COPD inhaler, former smoker,    breath sounds clear to auscultation       Cardiovascular hypertension, Pt. on medications  Rhythm:Regular Rate:Normal     Neuro/Psych PSYCHIATRIC DISORDERS Anxiety Depression  Neuromuscular disease    GI/Hepatic GERD  Controlled,  Endo/Other    Renal/GU Renal disease     Musculoskeletal   Abdominal   Peds  Hematology  (+) anemia ,   Anesthesia Other Findings   Reproductive/Obstetrics                             Anesthesia Physical Anesthesia Plan  ASA: III  Anesthesia Plan: MAC   Post-op Pain Management:    Induction: Intravenous  PONV Risk Score and Plan:   Airway Management Planned: Simple Face Mask  Additional Equipment:   Intra-op Plan:   Post-operative Plan:   Informed Consent: I have reviewed the patients History and Physical, chart, labs and discussed the procedure including the risks, benefits and alternatives for the proposed anesthesia with the patient or authorized representative who has indicated his/her understanding and acceptance.     Plan Discussed with:   Anesthesia Plan Comments:         Anesthesia Quick Evaluation

## 2017-10-30 NOTE — Telephone Encounter (Signed)
Called patient regarding cardiology referral, it is updated and she should be contacted now.  Also, her mammogram is scheduled for 11/07/17 at 11:15. Arrive at 11am no lotion, powders, or deoderant.

## 2017-10-31 ENCOUNTER — Inpatient Hospital Stay (HOSPITAL_COMMUNITY): Payer: PPO

## 2017-10-31 LAB — CBC
HCT: 37.1 % (ref 36.0–46.0)
Hemoglobin: 10.7 g/dL — ABNORMAL LOW (ref 12.0–15.0)
MCH: 24.9 pg — AB (ref 26.0–34.0)
MCHC: 28.8 g/dL — AB (ref 30.0–36.0)
MCV: 86.5 fL (ref 78.0–100.0)
PLATELETS: 251 10*3/uL (ref 150–400)
RBC: 4.29 MIL/uL (ref 3.87–5.11)
RDW: 16.5 % — AB (ref 11.5–15.5)
WBC: 6.6 10*3/uL (ref 4.0–10.5)

## 2017-10-31 LAB — BASIC METABOLIC PANEL
Anion gap: 12 (ref 5–15)
BUN: 13 mg/dL (ref 6–20)
CO2: 28 mmol/L (ref 22–32)
CREATININE: 0.66 mg/dL (ref 0.44–1.00)
Calcium: 8.4 mg/dL — ABNORMAL LOW (ref 8.9–10.3)
Chloride: 96 mmol/L — ABNORMAL LOW (ref 101–111)
GFR calc Af Amer: >60 mL/min (ref >60)
Glucose, Bld: 157 mg/dL — ABNORMAL HIGH (ref 65–99)
Potassium: 3.5 mmol/L (ref 3.5–5.1)
Sodium: 136 mmol/L (ref 135–145)

## 2017-10-31 LAB — MAGNESIUM: Magnesium: 2 mg/dL (ref 1.7–2.4)

## 2017-10-31 MED ORDER — GADOBENATE DIMEGLUMINE 529 MG/ML IV SOLN
20.0000 mL | Freq: Once | INTRAVENOUS | Status: AC | PRN
  Administered 2017-10-31: 20 mL via INTRAVENOUS

## 2017-10-31 MED ORDER — PREDNISONE 20 MG PO TABS
30.0000 mg | ORAL_TABLET | Freq: Every day | ORAL | Status: DC
  Administered 2017-11-01: 30 mg via ORAL
  Filled 2017-10-31: qty 1

## 2017-10-31 NOTE — Progress Notes (Signed)
10/31/2017 3:26 PM  I spoke with Dr Walden Field oncologist and they recommended MRI brain and will see patient next week for outpatient work up and will order PET scan at that time.  Pt could likely discharge home tomorrow.    Murvin Natal MD

## 2017-10-31 NOTE — Progress Notes (Signed)
PROGRESS NOTE    [name]  IHK:[id number]  DOB: [birth year]  DOA: 10/28/2017 PCP: Fayrene Helper, MD  Brief Admission Hx: [name] is a 72 y.o. female with medical history of COPD, HTN, Obesity, chronic back pain who presents from PCP's office who had visit planned with her today but sent her to the ER due to respiratory distress. The patient states she had tightness in her chest, shortness of breath and a cough with yellow mucous. She was given antibiotics for an acute bronchitis by Dr Meda Coffee (at Dr [facility] office) about 1-2 weeks ago. She was also given a steroid shot in the office and then 2 different cough medications. She stopped the antibiotic after a few days of use as she felt better. She has not had a fever, sinus or nasal drainage or sore throat. She feels better after treatment in the ER.  She had a CT chest that was suspicious for a primary lung cancer.    MDM/Assessment & Plan:   1. COPD with acute exacerbation - continue current therapy with nebs, steroids, antibiotics. Ambulatory pulse ox in the AM.    2. Influenza A - treated supportively and oseltamivir.  3. AKI - RESOLVED? prerenal treating with IVFs.  4. Hypokalemia - repleted. 5. Chronic back pain - resume home hydrocodone and tizanidine 6. Essential hypertension - holding lisinopril due to AKI, resumed home cardizem.  7. Hyperlipidemia - continue statin daily. 8. 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 Will consult oncology.  PET Scan ordered.   DVT prophylaxis: lovenox Code Status: Full  Family Communication: patient  Disposition Plan: pending further clinical improvement, hopefully in 1-2 days could discharge home  Consultants:  pulmonology  Procedures:  Bronchoscopy (3/20)  Subjective: Pt crying and upset about lung cancer diagnosis.     Objective: Vitals:   10/30/17 1500 10/30/17 2007  10/31/17 0500 10/31/17 0733  BP: (!) 150/81 (!) 122/57 122/75   Pulse: 94 95 95   Resp: 16 15 15    Temp: 98.9 F (37.2 C) 98.5 F (36.9 C) 98.5 F (36.9 C)   TempSrc: Oral Oral Oral   SpO2: 94% 93% 95% 97%  Weight:      Height:        Intake/Output Summary (Last 24 hours) at 10/31/2017 1128 Last data filed at 10/31/2017 0500 Gross per 24 hour  Intake 1350 ml  Output -  Net 1350 ml   Filed Weights   10/28/17 1629 10/28/17 2312  Weight: 104.3 kg (230 lb) 103.3 kg (227 lb 11.8 oz)   REVIEW OF SYSTEMS  As per history otherwise all reviewed and reported negative  Exam:  General exam: awake, alert, NAD, cooperative.  Respiratory system: improved exp wheezing. No increased work of breathing. Cardiovascular system: S1 & S2 heard, RRR. No JVD, murmurs, gallops, clicks or pedal edema. Gastrointestinal system: Abdomen is nondistended, soft and nontender. Normal bowel sounds heard. Central nervous system: Alert and oriented. No focal neurological deficits. Extremities: no CCE.  Data Reviewed: Basic Metabolic Panel: Recent Labs  Lab 10/28/17 1702 10/29/17 1040 10/30/17 0522 10/31/17 0549  NA 137 138 137 136  K 3.3* 4.0 3.5 3.5  CL 99* 100* 98* 96*  CO2 26 22 25 28   GLUCOSE 147* 244* 178* 157*  BUN 19 14 15 13   CREATININE 1.29* 0.79 0.72 0.66  CALCIUM 8.5* 8.2* 8.5* 8.4*  MG  --   --   --  2.0   Liver Function Tests: Recent Labs  Lab 10/28/17 1702  AST 19  ALT 15  ALKPHOS 112  BILITOT 0.5  PROT 6.9  ALBUMIN 3.3*   No results for input(s): LIPASE, AMYLASE in the last 168 hours. No results for input(s): AMMONIA in the last 168 hours. CBC: Recent Labs  Lab 10/28/17 1702 10/30/17 0522 10/31/17 0549  WBC 4.8 6.4 6.6  NEUTROABS 3.2 5.2  --   HGB 11.4* 10.9* 10.7*  HCT 38.8 37.3 37.1  MCV 86.2 86.3 86.5  PLT 249 253 251   Cardiac Enzymes: Recent Labs  Lab 10/28/17 1702  TROPONINI <0.03   CBG (last 3)  Recent Labs    10/30/17 0713  GLUCAP 113*    Recent Results (from the past 240 hour(s))  Blood culture (routine x 2)     Status: None (Preliminary result)   Collection Time: 10/28/17  5:29 PM  Result Value Ref Range Status   Specimen Description LEFT ANTECUBITAL  Final   Special Requests   Final    ANAEROBIC BOTTLE ONLY Blood Culture adequate volume   Culture   Final    NO GROWTH 3 DAYS Performed at [facility], [address], [place], [place] [address]    Report Status PENDING  Incomplete  Blood culture (routine x 2)     Status: None (Preliminary result)   Collection Time: 10/28/17  5:29 PM  Result Value Ref Range Status   Specimen Description RIGHT ANTECUBITAL  Final   Special Requests   Final    BOTTLES DRAWN AEROBIC AND ANAEROBIC Blood Culture adequate volume   Culture   Final    NO GROWTH 3 DAYS Performed at [facility], [address] [place], [place] [address]    Report Status PENDING  Incomplete  Respiratory Panel by PCR     Status: Abnormal   Collection Time: 10/28/17  9:00 PM  Result Value Ref Range Status   Adenovirus NOT DETECTED NOT DETECTED Final   Coronavirus 229E NOT DETECTED NOT DETECTED Final   Coronavirus HKU1 NOT DETECTED NOT DETECTED Final   Coronavirus NL63 NOT DETECTED NOT DETECTED Final   Coronavirus OC43 NOT DETECTED NOT DETECTED Final   Metapneumovirus NOT DETECTED NOT DETECTED Final   Rhinovirus / Enterovirus NOT DETECTED NOT DETECTED Final   Influenza A H1 2009 DETECTED (A) NOT DETECTED Final   Influenza B NOT DETECTED NOT DETECTED Final   Parainfluenza Virus 1 NOT DETECTED NOT DETECTED Final   Parainfluenza Virus 2 NOT DETECTED NOT DETECTED Final   Parainfluenza Virus 3 NOT DETECTED NOT DETECTED Final   Parainfluenza Virus 4 NOT DETECTED NOT DETECTED Final   Respiratory Syncytial Virus NOT DETECTED NOT DETECTED Final   Bordetella pertussis NOT DETECTED NOT DETECTED Final   Chlamydophila pneumoniae NOT DETECTED NOT DETECTED Final   Mycoplasma pneumoniae NOT DETECTED NOT DETECTED Final     Comment: Performed at [facility], [place] [address] [place], [place] [address]  Culture, sputum-assessment     Status: None   Collection Time: 10/28/17 10:24 PM  Result Value Ref Range Status   Specimen Description EXPECTORATED SPUTUM  Final   Special Requests NONE  Final   Sputum evaluation   Final    Sputum specimen not acceptable for testing.  Please recollect.   SPOKE WITH CICELY, RN @ [phone] ON 3.18.19 Performed at [facility], [address], [place], [place] [address]    Report Status 10/28/2017 FINAL  Final  Surgical PCR screen  Status: None   Collection Time: 10/30/17  3:00 AM  Result Value Ref Range Status   MRSA, PCR NEGATIVE NEGATIVE Final   Staphylococcus aureus NEGATIVE NEGATIVE Final    Comment: (NOTE) The Xpert SA Assay (FDA approved for NASAL specimens in patients 62 years of age and older), is one component of a comprehensive surveillance program. It is not intended to diagnose infection nor to guide or monitor treatment. Performed at [facility], [address], [place], [place] [address]      Studies: No results found. Scheduled Meds: . ALPRAZolam  1 mg Oral BID  . chlorhexidine  15 mL Mouth Rinse BID  . diltiazem  120 mg Oral Daily  . enoxaparin (LOVENOX) injection  40 mg Subcutaneous Q24H  . FLUoxetine  20 mg Oral Daily  . furosemide  20 mg Oral BID  . guaiFENesin  600 mg Oral BID  . HYDROcodone-acetaminophen  1 tablet Oral Q8H  . ipratropium-albuterol  3 mL Nebulization Q6H  . mouth rinse  15 mL Mouth Rinse q12n4p  . mometasone-formoterol  2 puff Inhalation BID  . oseltamivir  30 mg Oral BID  . pantoprazole  40 mg Oral Daily  . predniSONE  50 mg Oral Q breakfast  . rosuvastatin  20 mg Oral QHS  . sodium chloride flush  3 mL Intravenous Q12H  . tiZANidine  4 mg Oral TID   Continuous Infusions: . sodium chloride    . levofloxacin (LEVAQUIN) IV 750 mg (10/30/17 1635)    Principal Problem:   COPD with acute exacerbation ([place]) Active  Problems:   Mass of upper lobe of left lung   Hyperlipidemia LDL goal <100   Morbid obesity ([facility])   Essential hypertension   GERD   Emphysema lung ([facility])   AKI (acute kidney injury) ([place])   COPD exacerbation ([facility])   COPD (chronic obstructive pulmonary disease) ([place])  Time spent:   Irwin Brakeman, MD, FAAFP [facility] Pager [phone]  If 7PM-7AM, please contact night-coverage [email or web address] Password [email or web address] 10/31/2017, 11:28 AM    LOS: 2 days

## 2017-10-31 NOTE — Anesthesia Postprocedure Evaluation (Signed)
Anesthesia Post Note  Patient: [name]  Procedure(s) Performed: FLEXIBLE BRONCHOSCOPY (Bilateral ) BRONCHIAL BRUSHINGS (Left ) BRONCHIAL WASHINGS (Left )  Patient location during evaluation: Nursing Unit Anesthesia Type: MAC Level of consciousness: awake and alert Pain management: pain level controlled Vital Signs Assessment: post-procedure vital signs reviewed and stable Respiratory status: spontaneous breathing, nonlabored ventilation and respiratory function stable Cardiovascular status: blood pressure returned to baseline Postop Assessment: no apparent nausea or vomiting Anesthetic complications: no     Last Vitals:  Vitals Value Taken Time  BP    Temp    Pulse    Resp    SpO2      Last Pain:  Vitals:   10/31/17 0602  TempSrc:   PainSc: 3                  Soua Lenk J

## 2017-10-31 NOTE — Addendum Note (Signed)
Addendum  created 10/31/17 7445 by Charmaine Downs, CRNA   Sign clinical note

## 2017-10-31 NOTE — Progress Notes (Signed)
I went by to reiterate my findings on bronchoscopy because I was concerned that she would not remember what I told her and she did not.  I told her again that I found a mass in the left upper lobe area that I am essentially certain is a cancer.  I answered questions.  I told her that treatment would depend on exactly what type of cancer we are dealing with.  After my visit with her I received a phone call from pathology and the pathology shows squamous cell cancer

## 2017-11-01 ENCOUNTER — Encounter (HOSPITAL_COMMUNITY): Payer: Self-pay | Admitting: Pulmonary Disease

## 2017-11-01 DIAGNOSIS — C3492 Malignant neoplasm of unspecified part of left bronchus or lung: Secondary | ICD-10-CM | POA: Diagnosis present

## 2017-11-01 DIAGNOSIS — K219 Gastro-esophageal reflux disease without esophagitis: Secondary | ICD-10-CM

## 2017-11-01 DIAGNOSIS — I10 Essential (primary) hypertension: Secondary | ICD-10-CM

## 2017-11-01 DIAGNOSIS — J441 Chronic obstructive pulmonary disease with (acute) exacerbation: Principal | ICD-10-CM

## 2017-11-01 DIAGNOSIS — R918 Other nonspecific abnormal finding of lung field: Secondary | ICD-10-CM

## 2017-11-01 DIAGNOSIS — N179 Acute kidney failure, unspecified: Secondary | ICD-10-CM

## 2017-11-01 MED ORDER — PREDNISONE 10 MG PO TABS: ORAL_TABLET | ORAL | 0 refills | Status: DC

## 2017-11-01 MED ORDER — GUAIFENESIN ER 600 MG PO TB12: 600.0000 mg | ORAL_TABLET | Freq: Two times a day (BID) | ORAL | 0 refills | Status: AC

## 2017-11-01 MED ORDER — OSELTAMIVIR PHOSPHATE 30 MG PO CAPS: 30.0000 mg | ORAL_CAPSULE | Freq: Two times a day (BID) | ORAL | 0 refills | Status: DC

## 2017-11-01 MED ORDER — LEVOFLOXACIN 750 MG PO TABS: 750.0000 mg | ORAL_TABLET | Freq: Every day | ORAL | 0 refills | Status: AC

## 2017-11-01 NOTE — Progress Notes (Signed)
IV discontinued, catheter intact. Patient discharged with instructions given on medications and follow up visits,patient verbalized understanding.Prescriptions sent to Pharmacy of choice documented on AVS. Staff accompanied patient to an awaiting vehicle.

## 2017-11-01 NOTE — Discharge Summary (Signed)
Physician Discharge Summary  CARMELL ELGIN QIH:[id number] DOB: 07-Dec-1945 DOA: 10/28/2017  PCP: Fayrene Helper, MD  Admit date: 10/28/2017 Discharge date: 11/01/2017  Admitted From: Home Disposition: Home  Recommendations for Outpatient Follow-up:  1. Follow up with PCP in 1-2 weeks 2. Please obtain BMP/CBC in one week 3. Follow-up with oncology on 3/29  Home Health: Equipment/Devices:  Discharge Condition: Stable CODE STATUS: Full code Diet recommendation: Heart Healthy   Brief/Interim Summary: 72 year old female with history of COPD, hypertension, presented from her primary care physician's office with respiratory distress.  She was found to have COPD exacerbation which was likely precipitated by influenza A..  She was treated with steroids, antivirals and bronchodilators.  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  She is being referred to the oncology clinic for further management.  Overall COPD exacerbation is better and she is being discharged on a prednisone taper.  She will be continued on nebulizer treatments at home.  Discharge Diagnoses:  Principal Problem:   COPD with acute exacerbation ([place]) Active Problems:   Hyperlipidemia LDL goal <100   Morbid obesity ([facility])   Essential hypertension   GERD   Emphysema lung ([facility])   Mass of upper lobe of left lung   AKI (acute kidney injury) ([place])   COPD exacerbation ([facility])   COPD (chronic obstructive pulmonary disease) ([place])  1. COPD with acute exacerbation.  Treated with nebulizer treatments, intravenous steroids and antibiotics.  Respiratory status appears to be improving.  She still has a mild wheeze but is breathing comfortably on room air.  Steroids have been transitioned to prednisone taper.  She already has nebulizer treatments at home. 2. Influenza A.  Patient tested positive for influenza.  She has been treated with Tamiflu.  She is not having any  fevers. 3. Acute kidney injury.  Likely prerenal, related to dehydration.  Improved with IV fluids. 4. Hypertension.  Lisinopril was held due to acute kidney injury.  She was continued on Cardizem.  We will continue to hold lisinopril for now. 5. Hyperlipidemia.  Continue on statin. 6. Pulmonary mass.  Patient was found to have a left upper lobe mass and was seen by pulmonology.  She underwent bronchoscopy with biopsies which confirmed squamous cell cancer.  Regions have been made for her to follow-up with oncology as an outpatient.  She will likely will further workup with PET scan.  Discharge Instructions  Discharge Instructions    Diet - low sodium heart healthy   Complete by:  As directed    Increase activity slowly   Complete by:  As directed      Allergies as of 11/01/2017      Reactions   Codeine    Penicillins Other (See Comments)   Blisters in mouth after dental work Has patient had a PCN reaction causing immediate rash, facial/tongue/throat swelling, SOB or lightheadedness with hypotension: Yes Has patient had a PCN reaction causing severe rash involving mucus membranes or skin necrosis: No Has patient had a PCN reaction that required hospitalization No Has patient had a PCN reaction occurring within the last 10 years: Yes If all of the above answers are "NO", then may proceed with Cephalosporin use.      Medication List    TAKE these medications   albuterol (2.5 MG/3ML) 0.083% nebulizer solution Commonly known as:  PROVENTIL INHALE ONE VIAL VIA NEBULIZER EVERY 6 HOURS AS NEEDED.   albuterol 108 (90 Base) MCG/ACT  inhaler Commonly known as:  PROAIR HFA UWSE 2 PUFFS EVERY 6 HOURS AS NEEDED FOR SHORTNESS OF BREATH.   ALPRAZolam 1 MG tablet Commonly known as:  XANAX take 1 tablet by mouth twice a day   ASPIRIN LOW DOSE 81 MG EC tablet Generic drug:  aspirin Take 81 mg by mouth daily.   budesonide-formoterol 80-4.5 MCG/ACT inhaler Commonly known as:   SYMBICORT Inhale 2 puffs into the lungs 2 (two) times daily.   celecoxib 400 MG capsule Commonly known as:  CELEBREX Take 1 capsule (400 mg total) by mouth daily.   diltiazem 120 MG 24 hr capsule Commonly known as:  CARDIZEM CD Take 1 capsule (120 mg total) by mouth daily.   FLUoxetine 20 MG capsule Commonly known as:  PROZAC Take 1 capsule (20 mg total) by mouth daily.   furosemide 20 MG tablet Commonly known as:  LASIX Take 1 tablet (20 mg total) by mouth 2 (two) times daily.   guaiFENesin 600 MG 12 hr tablet Commonly known as:  MUCINEX Take 1 tablet (600 mg total) by mouth 2 (two) times daily.   HYDROcodone-acetaminophen 5-325 MG tablet Commonly known as:  NORCO/VICODIN TAKE ONE TABLET THREE TIMES A DAY   levofloxacin 750 MG tablet Commonly known as:  LEVAQUIN Take 1 tablet (750 mg total) by mouth daily for 2 doses.   lisinopril 10 MG tablet Commonly known as:  PRINIVIL,ZESTRIL Take 1 tablet (10 mg total) by mouth daily.   omeprazole 40 MG capsule Commonly known as:  PRILOSEC Take 1 capsule (40 mg total) by mouth daily.   oseltamivir 30 MG capsule Commonly known as:  TAMIFLU Take 1 capsule (30 mg total) by mouth 2 (two) times daily.   potassium chloride SA 20 MEQ tablet Commonly known as:  K-DUR,KLOR-CON Take 1 tablet (20 mEq total) by mouth daily.   predniSONE 10 MG tablet Commonly known as:  DELTASONE Take 40mg  po daily for 2 days then 30mg  daily for 2 days then 20mg  daily for 2 days then 10mg  daily for 2 days then stop   rosuvastatin 20 MG tablet Commonly known as:  CRESTOR take 1 tablet by mouth once daily   tiZANidine 4 MG tablet Commonly known as:  ZANAFLEX Take 1 tablet (4 mg total) by mouth 3 (three) times daily.      Follow-up Information    Higgs, Mathis Dad, MD On 11/08/2017.   Specialty:  Oncology Why:  8:10am Contact information: [place] [address] [phone]        Fayrene Helper, MD. Schedule an appointment as  soon as possible for a visit on 11/06/2017.   Specialty:  Family Medicine Why:  9:40am Pt is being worked in to the schedule and may have to wait. Contact information: [address], [address] [place] [address] [phone]          Allergies  Allergen Reactions  . Codeine   . Penicillins Other (See Comments)    Blisters in mouth after dental work Has patient had a PCN reaction causing immediate rash, facial/tongue/throat swelling, SOB or lightheadedness with hypotension: Yes Has patient had a PCN reaction causing severe rash involving mucus membranes or skin necrosis: No Has patient had a PCN reaction that required hospitalization No Has patient had a PCN reaction occurring within the last 10 years: Yes If all of the above answers are "NO", then may proceed with Cephalosporin use.     Consultations:  Pulmonology   Procedures/Studies: Ct Chest  W Contrast  Result Date: 10/28/2017 CLINICAL DATA:  Cough and shortness of breath for 2 weeks, abnormal chest x-ray from earlier today EXAM: CT CHEST WITH CONTRAST TECHNIQUE: Multidetector CT imaging of the chest was performed during intravenous contrast administration. CONTRAST:  41mL ISOVUE-300 IOPAMIDOL (ISOVUE-300) INJECTION 61% COMPARISON:  Chest x-ray from earlier today, CT of the chest from 02/19/2017 FINDINGS: Cardiovascular: Thoracic aorta demonstrates atherosclerotic calcifications. No aneurysmal dilatation or dissection is seen. The pulmonary artery is well visualize without evidence of filling defect to suggest pulmonary embolism. Heart is not significantly enlarged. No significant coronary calcifications are seen. Mediastinum/Nodes: The esophagus is within normal limits. Thoracic inlet is unremarkable. Scattered small mediastinal lymph nodes are noted. The largest of these lies along the lateral aspect of the aortic arch measuring approximately 8 mm. In the right hilum tiny lymph nodes are seen not significant by size criteria.  In the left hilum, there is a 3.4 x 2.9 cm soft tissue mass lesion identified. Lungs/Pleura: Multiple smaller soft tissue masses are noted throughout the left upper lobe, the largest of which measures 16 mm. These changes are most consistent with pulmonary neoplasm. Scattered changes are noted in the subpleural region of the right lower lobe best seen on image number 92 of series 4. These are likely postinflammatory in nature. No sizable effusion or pneumothorax is noted. Upper Abdomen: Diffuse fatty infiltration of the liver is noted. Gallbladder has been surgically removed. Musculoskeletal: No acute bony abnormality noted. IMPRESSION: Scattered soft tissue mass lesions throughout the left upper lobe with associated left hilar mass most consistent with pulmonary neoplasm till proven otherwise. Further evaluation by means of bronchoscopic biopsy and PET-CT are recommended. Scattered changes in the right lower lobe likely postinflammatory in nature. Electronically Signed   By: Inez Catalina M.D.   On: 10/28/2017 19:14   Mr Brain Wo Contrast  Result Date: 10/31/2017 CLINICAL DATA:  Lung cancer staging EXAM: MRI HEAD WITHOUT CONTRAST TECHNIQUE: Multiplanar, multiecho pulse sequences of the brain and surrounding structures were obtained without intravenous contrast. COMPARISON:  Brain MRI 06/16/2008 FINDINGS: Brain: The midline structures are normal. There is no acute infarct or acute hemorrhage. No mass lesion, hydrocephalus, dural abnormality or extra-axial collection. The brain parenchymal signal is normal for the patient's age. No age-advanced or lobar predominant atrophy. No chronic microhemorrhage or superficial siderosis. Vascular: Major intracranial arterial and venous sinus flow voids are preserved. Skull and upper cervical spine: The visualized skull base, calvarium, upper cervical spine and extracranial soft tissues are normal. Sinuses/Orbits: No fluid levels or advanced mucosal thickening. No mastoid or  middle ear effusion. Normal orbits. IMPRESSION: Normal noncontrast brain MRI. Please note that the lack of intravenous contrast material markedly limits sensitivity for the detection of small metastases. Electronically Signed   By: Ulyses Jarred M.D.   On: 10/31/2017 15:30   Mr Brain W Contrast  Result Date: 10/31/2017 CLINICAL DATA:  Lung cancer staging EXAM: MRI HEAD WITH CONTRAST TECHNIQUE: Multiplanar, multiecho pulse sequences of the brain and surrounding structures were obtained with intravenous contrast. CONTRAST:  92mL MULTIHANCE GADOBENATE DIMEGLUMINE 529 MG/ML IV SOLN COMPARISON:  Brain MRI 06/16/2008 FINDINGS: The examination is interpreted in the context of the noncontrast MRI obtained earlier the same day. There are no intraparenchymal contrast-enhancing lesions. There is a focal area of dural based thickening and contrast enhancement measuring 9 mm at the superior left convexity (coronal image 16). No other abnormal extra-axial enhancement. No calvarial lesions. IMPRESSION: 1. Dural-based focus of extra-axial contrast enhancement along the  superior left convexity. This may be a small meningioma, but a dural-based metastasis could also have this appearance. Meningioma is favored, as this was probably present on the noncontrast MRI of 06/16/2008, based on series 11 image 18 of that study. 2. No intraparenchymal lesions. Electronically Signed   By: Ulyses Jarred M.D.   On: 10/31/2017 16:44   Dg Chest Port 1 View  Result Date: 10/28/2017 CLINICAL DATA:  Cough, SOB, wheezing x1 week. Hx of COPD, Former smoker- quit 1 month ago. EXAM: PORTABLE CHEST 1 VIEW COMPARISON:  Chest x-ray dated 06/28/2014. Chest CT dated 02/19/2017. FINDINGS: Left upper lobe opacity, extending towards the left hilum, somewhat masslike in appearance at the left hilum. Right lung is clear. Heart size and mediastinal contours are stable. No pleural effusion seen. No acute or suspicious osseous finding. IMPRESSION: Left upper  lobe opacity, extending towards the left hilum, somewhat masslike in appearance at the left hilum. Findings could certainly represent pneumonia with perihilar extension and/or pneumonia with reactive perihilar lymphadenopathy. However, given the appearance and patient's smoking history, I am concerned this could represent a perihilar mass with postobstructive pneumonia or atelectasis. Recommend chest CT for further characterization. Electronically Signed   By: Franki Cabot M.D.   On: 10/28/2017 17:29    Bronchoscopy with biopsy of mass lesion of left upper lobe   Subjective: Feeling better.  Shortness of breath is better.  Continues to have cough.  Discharge Exam: Vitals:   11/01/17 0750 11/01/17 1332  BP:  115/78  Pulse:  78  Resp:  18  Temp:  98.2 F (36.8 C)  SpO2: 92% 91%   Vitals:   11/01/17 0500 11/01/17 0742 11/01/17 0750 11/01/17 1332  BP: 122/81   115/78  Pulse: 76   78  Resp: 16   18  Temp: 98 F (36.7 C)   98.2 F (36.8 C)  TempSrc: Oral   Oral  SpO2: 93% 92% 92% 91%  Weight:      Height:        General: Pt is alert, awake, not in acute distress Cardiovascular: RRR, S1/S2 +, no rubs, no gallops Respiratory: Rhonchi with mild wheeze bilaterally Abdominal: Soft, NT, ND, bowel sounds + Extremities: no edema, no cyanosis    The results of significant diagnostics from this hospitalization (including imaging, microbiology, ancillary and laboratory) are listed below for reference.     Microbiology: Recent Results (from the past 240 hour(s))  Blood culture (routine x 2)     Status: None (Preliminary result)   Collection Time: 10/28/17  5:29 PM  Result Value Ref Range Status   Specimen Description LEFT ANTECUBITAL  Final   Special Requests   Final    ANAEROBIC BOTTLE ONLY Blood Culture adequate volume   Culture   Final    NO GROWTH 4 DAYS Performed at [facility], [address] [place], [place] [address]    Report Status PENDING  Incomplete  Blood culture  (routine x 2)     Status: None (Preliminary result)   Collection Time: 10/28/17  5:29 PM  Result Value Ref Range Status   Specimen Description RIGHT ANTECUBITAL  Final   Special Requests   Final    BOTTLES DRAWN AEROBIC AND ANAEROBIC Blood Culture adequate volume   Culture   Final    NO GROWTH 4 DAYS Performed at [facility], [address] [place], [place] [address]    Report Status PENDING  Incomplete  Respiratory Panel by PCR     Status:  Abnormal   Collection Time: 10/28/17  9:00 PM  Result Value Ref Range Status   Adenovirus NOT DETECTED NOT DETECTED Final   Coronavirus 229E NOT DETECTED NOT DETECTED Final   Coronavirus HKU1 NOT DETECTED NOT DETECTED Final   Coronavirus NL63 NOT DETECTED NOT DETECTED Final   Coronavirus OC43 NOT DETECTED NOT DETECTED Final   Metapneumovirus NOT DETECTED NOT DETECTED Final   Rhinovirus / Enterovirus NOT DETECTED NOT DETECTED Final   Influenza A H1 2009 DETECTED (A) NOT DETECTED Final   Influenza B NOT DETECTED NOT DETECTED Final   Parainfluenza Virus 1 NOT DETECTED NOT DETECTED Final   Parainfluenza Virus 2 NOT DETECTED NOT DETECTED Final   Parainfluenza Virus 3 NOT DETECTED NOT DETECTED Final   Parainfluenza Virus 4 NOT DETECTED NOT DETECTED Final   Respiratory Syncytial Virus NOT DETECTED NOT DETECTED Final   Bordetella pertussis NOT DETECTED NOT DETECTED Final   Chlamydophila pneumoniae NOT DETECTED NOT DETECTED Final   Mycoplasma pneumoniae NOT DETECTED NOT DETECTED Final    Comment: Performed at [facility], [place] [address], [place], [place] [address]  Culture, sputum-assessment     Status: None   Collection Time: 10/28/17 10:24 PM  Result Value Ref Range Status   Specimen Description EXPECTORATED SPUTUM  Final   Special Requests NONE  Final   Sputum evaluation   Final    Sputum specimen not acceptable for testing.  Please recollect.   SPOKE WITH CICELY, RN @ [phone] ON 3.18.19 Performed at [facility], [address],  [place], [place] [address]    Report Status 10/28/2017 FINAL  Final  Surgical PCR screen     Status: None   Collection Time: 10/30/17  3:00 AM  Result Value Ref Range Status   MRSA, PCR NEGATIVE NEGATIVE Final   Staphylococcus aureus NEGATIVE NEGATIVE Final    Comment: (NOTE) The Xpert SA Assay (FDA approved for NASAL specimens in patients 48 years of age and older), is one component of a comprehensive surveillance program. It is not intended to diagnose infection nor to guide or monitor treatment. Performed at [facility], [address], [place], [place] [address]      Labs: BNP (last 3 results) Recent Labs    10/28/17 1702  BNP 38.2   Basic Metabolic Panel: Recent Labs  Lab 10/28/17 1702 10/29/17 1040 10/30/17 0522 10/31/17 0549  NA 137 138 137 136  K 3.3* 4.0 3.5 3.5  CL 99* 100* 98* 96*  CO2 26 22 25 28   GLUCOSE 147* 244* 178* 157*  BUN 19 14 15 13   CREATININE 1.29* 0.79 0.72 0.66  CALCIUM 8.5* 8.2* 8.5* 8.4*  MG  --   --   --  2.0   Liver Function Tests: Recent Labs  Lab 10/28/17 1702  AST 19  ALT 15  ALKPHOS 112  BILITOT 0.5  PROT 6.9  ALBUMIN 3.3*   No results for input(s): LIPASE, AMYLASE in the last 168 hours. No results for input(s): AMMONIA in the last 168 hours. CBC: Recent Labs  Lab 10/28/17 1702 10/30/17 0522 10/31/17 0549  WBC 4.8 6.4 6.6  NEUTROABS 3.2 5.2  --   HGB 11.4* 10.9* 10.7*  HCT 38.8 37.3 37.1  MCV 86.2 86.3 86.5  PLT 249 253 251   Cardiac Enzymes: Recent Labs  Lab 10/28/17 1702  TROPONINI <0.03   BNP: Invalid input(s): POCBNP CBG: Recent Labs  Lab 10/30/17 0713  GLUCAP 113*   D-Dimer No results for input(s): DDIMER in  the last 72 hours. Hgb A1c No results for input(s): HGBA1C in the last 72 hours. Lipid Profile No results for input(s): CHOL, HDL, LDLCALC, TRIG, CHOLHDL, LDLDIRECT in the last 72 hours. Thyroid function studies No results for input(s): TSH, T4TOTAL, T3FREE, THYROIDAB in the last 72  hours.  Invalid input(s): FREET3 Anemia work up No results for input(s): VITAMINB12, FOLATE, FERRITIN, TIBC, IRON, RETICCTPCT in the last 72 hours. Urinalysis    Component Value Date/Time   COLORURINE YELLOW 03/23/2010 1030   APPEARANCEUR CLOUDY (A) 03/23/2010 1030   LABSPEC 1.030 03/23/2010 1030   PHURINE 5.0 03/23/2010 1030   GLUCOSEU NEGATIVE 03/23/2010 1030   HGBUR NEGATIVE 03/23/2010 1030   BILIRUBINUR negative 05/31/2014 1118   KETONESUR 15 (A) 03/23/2010 1030   PROTEINUR negative 05/31/2014 1118   PROTEINUR NEGATIVE 03/23/2010 1030   UROBILINOGEN 0.2 05/31/2014 1118   UROBILINOGEN 1.0 03/23/2010 1030   NITRITE negative 05/31/2014 1118   NITRITE NEGATIVE 03/23/2010 1030   LEUKOCYTESUR Trace 05/31/2014 1118   Sepsis Labs Invalid input(s): PROCALCITONIN,  WBC,  LACTICIDVEN Microbiology Recent Results (from the past 240 hour(s))  Blood culture (routine x 2)     Status: None (Preliminary result)   Collection Time: 10/28/17  5:29 PM  Result Value Ref Range Status   Specimen Description LEFT ANTECUBITAL  Final   Special Requests   Final    ANAEROBIC BOTTLE ONLY Blood Culture adequate volume   Culture   Final    NO GROWTH 4 DAYS Performed at [facility], [address], [place], [place] [address]    Report Status PENDING  Incomplete  Blood culture (routine x 2)     Status: None (Preliminary result)   Collection Time: 10/28/17  5:29 PM  Result Value Ref Range Status   Specimen Description RIGHT ANTECUBITAL  Final   Special Requests   Final    BOTTLES DRAWN AEROBIC AND ANAEROBIC Blood Culture adequate volume   Culture   Final    NO GROWTH 4 DAYS Performed at [facility], [address] [place], [place] [address]    Report Status PENDING  Incomplete  Respiratory Panel by PCR     Status: Abnormal   Collection Time: 10/28/17  9:00 PM  Result Value Ref Range Status   Adenovirus NOT DETECTED NOT DETECTED Final   Coronavirus 229E NOT DETECTED NOT DETECTED Final    Coronavirus HKU1 NOT DETECTED NOT DETECTED Final   Coronavirus NL63 NOT DETECTED NOT DETECTED Final   Coronavirus OC43 NOT DETECTED NOT DETECTED Final   Metapneumovirus NOT DETECTED NOT DETECTED Final   Rhinovirus / Enterovirus NOT DETECTED NOT DETECTED Final   Influenza A H1 2009 DETECTED (A) NOT DETECTED Final   Influenza B NOT DETECTED NOT DETECTED Final   Parainfluenza Virus 1 NOT DETECTED NOT DETECTED Final   Parainfluenza Virus 2 NOT DETECTED NOT DETECTED Final   Parainfluenza Virus 3 NOT DETECTED NOT DETECTED Final   Parainfluenza Virus 4 NOT DETECTED NOT DETECTED Final   Respiratory Syncytial Virus NOT DETECTED NOT DETECTED Final   Bordetella pertussis NOT DETECTED NOT DETECTED Final   Chlamydophila pneumoniae NOT DETECTED NOT DETECTED Final   Mycoplasma pneumoniae NOT DETECTED NOT DETECTED Final    Comment: Performed at [facility], [place] [address] [place], [place] [address]  Culture, sputum-assessment     Status: None   Collection Time: 10/28/17 10:24 PM  Result Value Ref Range Status   Specimen Description EXPECTORATED SPUTUM  Final   Special Requests NONE  Final   Sputum evaluation   Final    Sputum specimen not acceptable for testing.  Please recollect.   SPOKE WITH CICELY, RN @ [phone] ON 3.18.19 Performed at [facility], [address] [place], [place] [address]    Report Status 10/28/2017 FINAL  Final  Surgical PCR screen     Status: None   Collection Time: 10/30/17  3:00 AM  Result Value Ref Range Status   MRSA, PCR NEGATIVE NEGATIVE Final   Staphylococcus aureus NEGATIVE NEGATIVE Final    Comment: (NOTE) The Xpert SA Assay (FDA approved for NASAL specimens in patients 16 years of age and older), is one component of a comprehensive surveillance program. It is not intended to diagnose infection nor to guide or monitor treatment. Performed at [facility], [address] [place], [place] [address]      Time coordinating discharge: Over 30  minutes  SIGNED:   Kathie Dike, MD  [facility] 11/01/2017, 7:29 PM Pager   If 7PM-7AM, please contact night-coverage [email or web address] Password [email or web address]

## 2017-11-02 LAB — CULTURE, BLOOD (ROUTINE X 2)
Culture: NO GROWTH
Culture: NO GROWTH
SPECIAL REQUESTS: ADEQUATE
SPECIAL REQUESTS: ADEQUATE

## 2017-11-05 ENCOUNTER — Telehealth: Payer: Self-pay | Admitting: Family Medicine

## 2017-11-05 NOTE — Telephone Encounter (Signed)
Transition Care Management Follow-up Telephone Call   Date discharged?   11/01/17             How have you been since you were released from the hospital? Patient states she has been good since getting home.   Do you understand why you were in the hospital? Yes   Do you understand the discharge instructions? Yes   Where were you discharged to? Home   Items Reviewed:  Medications reviewed: Yes. She was put on mucinex, prednisone, levofloxacin, tamiflu.  Allergies reviewed: Yes  Dietary changes reviewed: Yes  Referrals reviewed: Yes   Functional Questionnaire:   Activities of Daily Living (ADLs):  Patient is independent     Any transportation issues/concerns?: No.    Any patient concerns? No.   Confirmed importance and date/time of follow-up visits scheduled    Yes  Confirmed with patient if condition begins to worsen call PCP or go to the ER.  Patient was given the office number and encouraged to call back with question or concerns.  :  Yes

## 2017-11-06 ENCOUNTER — Encounter: Payer: Self-pay | Admitting: Family Medicine

## 2017-11-06 ENCOUNTER — Ambulatory Visit (INDEPENDENT_AMBULATORY_CARE_PROVIDER_SITE_OTHER): Payer: PPO | Admitting: Family Medicine

## 2017-11-06 ENCOUNTER — Other Ambulatory Visit: Payer: Self-pay | Admitting: Family Medicine

## 2017-11-06 VITALS — BP 158/80 | HR 85 | Resp 17 | Ht 63.0 in | Wt 223.0 lb

## 2017-11-06 DIAGNOSIS — I10 Essential (primary) hypertension: Secondary | ICD-10-CM

## 2017-11-06 DIAGNOSIS — Z1231 Encounter for screening mammogram for malignant neoplasm of breast: Secondary | ICD-10-CM

## 2017-11-06 DIAGNOSIS — Z09 Encounter for follow-up examination after completed treatment for conditions other than malignant neoplasm: Secondary | ICD-10-CM

## 2017-11-06 MED ORDER — ALPRAZOLAM 1 MG PO TABS: 1.0000 mg | ORAL_TABLET | Freq: Two times a day (BID) | ORAL | 5 refills | Status: DC

## 2017-11-06 NOTE — Patient Instructions (Addendum)
F/U as before, call if you need me sooner  CBC and chem 7 today   Please keep a;ll appointments  We are here for you

## 2017-11-07 ENCOUNTER — Ambulatory Visit (HOSPITAL_COMMUNITY)
Admission: RE | Admit: 2017-11-07 | Discharge: 2017-11-07 | Disposition: A | Discharge status: Home / Self Care | Payer: PPO | Source: Ambulatory Visit | Attending: Family Medicine | Admitting: Family Medicine

## 2017-11-07 ENCOUNTER — Ambulatory Visit (HOSPITAL_COMMUNITY): Payer: Self-pay

## 2017-11-07 ENCOUNTER — Other Ambulatory Visit: Payer: Self-pay | Admitting: *Deleted

## 2017-11-07 DIAGNOSIS — Z1231 Encounter for screening mammogram for malignant neoplasm of breast: Secondary | ICD-10-CM | POA: Diagnosis not present

## 2017-11-08 ENCOUNTER — Telehealth: Payer: Self-pay

## 2017-11-08 ENCOUNTER — Other Ambulatory Visit: Payer: Self-pay

## 2017-11-08 ENCOUNTER — Inpatient Hospital Stay (HOSPITAL_COMMUNITY): Payer: PPO | Attending: Hematology | Admitting: Hematology

## 2017-11-08 ENCOUNTER — Encounter (HOSPITAL_COMMUNITY): Payer: Self-pay | Admitting: Hematology

## 2017-11-08 VITALS — BP 124/58 | HR 89 | Temp 98.3°F | Resp 20 | Wt 223.4 lb

## 2017-11-08 DIAGNOSIS — E785 Hyperlipidemia, unspecified: Secondary | ICD-10-CM | POA: Diagnosis not present

## 2017-11-08 DIAGNOSIS — Z87891 Personal history of nicotine dependence: Secondary | ICD-10-CM | POA: Diagnosis not present

## 2017-11-08 DIAGNOSIS — Z79899 Other long term (current) drug therapy: Secondary | ICD-10-CM | POA: Insufficient documentation

## 2017-11-08 DIAGNOSIS — Z808 Family history of malignant neoplasm of other organs or systems: Secondary | ICD-10-CM | POA: Diagnosis not present

## 2017-11-08 DIAGNOSIS — F17210 Nicotine dependence, cigarettes, uncomplicated: Secondary | ICD-10-CM | POA: Diagnosis not present

## 2017-11-08 DIAGNOSIS — Z800 Family history of malignant neoplasm of digestive organs: Secondary | ICD-10-CM | POA: Insufficient documentation

## 2017-11-08 DIAGNOSIS — K760 Fatty (change of) liver, not elsewhere classified: Secondary | ICD-10-CM

## 2017-11-08 DIAGNOSIS — Z801 Family history of malignant neoplasm of trachea, bronchus and lung: Secondary | ICD-10-CM | POA: Diagnosis not present

## 2017-11-08 DIAGNOSIS — Z7982 Long term (current) use of aspirin: Secondary | ICD-10-CM

## 2017-11-08 DIAGNOSIS — K219 Gastro-esophageal reflux disease without esophagitis: Secondary | ICD-10-CM | POA: Diagnosis not present

## 2017-11-08 DIAGNOSIS — M549 Dorsalgia, unspecified: Secondary | ICD-10-CM | POA: Diagnosis not present

## 2017-11-08 DIAGNOSIS — M129 Arthropathy, unspecified: Secondary | ICD-10-CM | POA: Diagnosis not present

## 2017-11-08 DIAGNOSIS — I10 Essential (primary) hypertension: Secondary | ICD-10-CM | POA: Insufficient documentation

## 2017-11-08 DIAGNOSIS — Z8781 Personal history of (healed) traumatic fracture: Secondary | ICD-10-CM | POA: Diagnosis not present

## 2017-11-08 DIAGNOSIS — E669 Obesity, unspecified: Secondary | ICD-10-CM | POA: Insufficient documentation

## 2017-11-08 DIAGNOSIS — F419 Anxiety disorder, unspecified: Secondary | ICD-10-CM | POA: Insufficient documentation

## 2017-11-08 DIAGNOSIS — C3492 Malignant neoplasm of unspecified part of left bronchus or lung: Secondary | ICD-10-CM

## 2017-11-08 DIAGNOSIS — C3412 Malignant neoplasm of upper lobe, left bronchus or lung: Secondary | ICD-10-CM | POA: Insufficient documentation

## 2017-11-08 DIAGNOSIS — R590 Localized enlarged lymph nodes: Secondary | ICD-10-CM | POA: Diagnosis not present

## 2017-11-08 DIAGNOSIS — J449 Chronic obstructive pulmonary disease, unspecified: Secondary | ICD-10-CM | POA: Diagnosis not present

## 2017-11-08 NOTE — Patient Outreach (Signed)
[facility]) Care Management  11/08/2017  [name] 1945-12-01 [id number]     EMMI-GENERAL DISCHARGE RED ON EMMI ALERT Day # 4 Date:11/07/17 Red Alert Reason: "Know who to call about changes in condition? No"   Outreach attempt # 1 to patient. Spoke with patient. She voices that she is doing fairly well and improving. RN CM reviewed and addressed red alert with patient. Error in response. Patient able to confirm that she is to contact PCP for any changes in her condition. She has MD contact info. She has already completed PCP follow up appt. She reports that she has all her meds. She is due to refill her pain med on tomorrow but has enough to last her until then. She voices no other issues with meds. Patient fills med planner on her own. She reports she is coughing up a lot of mucus/phlegm that in yellowish in  Color. MD aware and has told her to continue taking cough med. RN CM reviewed s/s f worsening condition and when to seek medical attention. Patient voiced understanding. She denies any further RN CM needs or concerns. Advised patient that she has completed post discharge EMMI automated calls. She was appreciative of follow up call.      Plan: RN CM will close case at this time.  Enzo Montgomery, RN,BSN,CCM [facility] Telephonic Care Management Coordinator Direct Phone: [phone] Toll Free: [phone] Fax: [phone]

## 2017-11-08 NOTE — Assessment & Plan Note (Signed)
1.  Newly diagnosed squamous cell carcinoma of the lung: - She presented to the hospital with breathing difficulty, CT scan of the chest on 10/28/2017 showed left hilar mass measuring 3.4 x 2.9 cm, multiple soft tissue masses in the left upper lobe, largest 1.6 cm, 54-pack-year smoking history, quit in January 2019 -Underwent bronchoscopy and biopsy on 10/30/2017 consistent with squamous cell carcinoma -MRI of brain on 10/31/2017 showing dural based focus of contrast enhancement along superior left convexity, consistent with a meningioma -I have discussed the results of the CT scan and pathology report with the patient and her husband in detail.  She has at least a T3 disease based on multiple nodules in the same lobe of the lung.  She has hilar adenopathy.  I have recommended PET/CT scan to rule out metastatic disease.  We will see her back after the PET/CT scan to discuss the results and further plan of action.  Clinically she does have at least a stage III disease at this time.  2.  Family history: The 2 of her sisters died of stomach cancer.  Father died of lung cancer.  Maternal aunt died of thyroid cancer.  Maternal grandfather had cancer in the bone.  Primary is unknown.  She could be a candidate for genetic testing.

## 2017-11-08 NOTE — Progress Notes (Signed)
[facility] NOTE  Patient Care Team: Fayrene Helper, MD as PCP - [facility] [place], Tomasa Blase, RN as [place] Management  CHIEF COMPLAINTS/PURPOSE OF CONSULTATION:  Newly diagnosed lung cancer.  HISTORY OF PRESENTING ILLNESS:  [place] 72 y.o. female is seen in consultation today for newly diagnosed squamous cell carcinoma of the left lung.  She recently presented to her primary care doctor with complaints of shortness of breath.  She was sent to the hospital for admission.  A CT scan of the chest on 10/28/2017 shows left hilar mass with multiple nodules in the left upper lobe.  Bronchoscopy was done on 10/30/2017 with a biopsy showing squamous cell carcinoma of the lung.  She has some cough but denied any hemoptysis.  No new onset pains were reported.  She lives at her home with her husband.  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  She uses inhalers for COPD and uses albuterol nebulizer as needed.  She denies any weight loss.  She denies any tingling or numbness next remedies.  She had benign lung nodules removed 10 years ago.  MEDICAL HISTORY:  Past Medical History:  Diagnosis Date  . Anxiety   . Arthritis   . Chronic back pain   . COPD (chronic obstructive pulmonary disease) ([place])   . 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 GERD (gastroesophageal reflux disease) 2000  . Hyperlipidemia 1995  . Hypertension 1995  . Nicotine addiction   . Obesity     SURGICAL HISTORY: Past Surgical History:  Procedure Laterality Date  . BACK SURGERY  1999  . BRONCHIAL BRUSHINGS Left 10/30/2017   Procedure: BRONCHIAL BRUSHINGS;  Surgeon: [name], MD;  [facility];  Service: Cardiopulmonary;  Laterality: Left;  . BRONCHIAL WASHINGS Left 10/30/2017   Procedure: BRONCHIAL WASHINGS;  Surgeon: [name], MD;  Location: [facility];  Service:  Cardiopulmonary;  Laterality: Left;  . CHOLECYSTECTOMY  2001  . COLONOSCOPY N/A 02/02/2016   Procedure: COLONOSCOPY;  Surgeon: [name], MD;  Location: [facility];  Service: Endoscopy;  Laterality: N/A;  930  . FLEXIBLE BRONCHOSCOPY Bilateral 10/30/2017   Procedure: FLEXIBLE BRONCHOSCOPY;  Surgeon: [name], MD;  Location: [facility];  Service: Cardiopulmonary;  Laterality: Bilateral;  . INCISIONAL HERNIA REPAIR  August 17, 2008  . left inguinal hernia herniorrhapy  1980  . removal of thmoma  03/27/2010   Dr. Arlyce Dice   . SPINE SURGERY    . TONSILLECTOMY    . TOTAL ABDOMINAL HYSTERECTOMY W/ BILATERAL SALPINGOOPHORECTOMY  1984    SOCIAL HISTORY: Social History   Socioeconomic History  . Marital status: Married    Spouse name: Not on file  . Number of children: Not on file  . Years of education: Not on file  . Highest education level: Not on file  Occupational History  . 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 Financial resource strain: Not hard at all  . Food insecurity:    Worry: Never true    Inability: Never true  . Transportation needs:    Medical: No    Non-medical: No  Tobacco Use  . Smoking status: Former Smoker    Packs/day: 1.50    Years: 54.00    Pack years: 81.00    Types: Cigarettes    Last attempt  to quit: 09/25/2017    Years since quitting: 0.1  . Smokeless tobacco: Never Used  Substance and Sexual Activity  . Alcohol use: No    Alcohol/week: 0.0 oz  . Drug use: No  . Sexual activity: Not Currently  Lifestyle  . Physical activity:    Days per week: 0 days    Minutes per session: 0 min  . Stress: Only a little  Relationships  . Social connections:    Talks on phone: More than three times a week    Gets together: More than three times a week    Attends religious service: More than 4 times per year    Active member of club or organization: No    Attends meetings of clubs or  organizations: Never    Relationship status: Married  . Intimate partner violence:    Fear of current or ex partner: No    Emotionally abused: No    Physically abused: No    Forced sexual activity: No  Other Topics Concern  . Not on file  Social History Narrative   Pt had 2 stillborns     FAMILY HISTORY: Family History  Problem Relation Age of Onset  . Heart disease Mother        enlarged  . Diabetes Mother   . Hypertension Mother   . Cancer Father        throat  . Hypertension Father   . Coronary artery disease Father   . Diabetes Sister        x3  . Asthma Sister   . Kidney disease Brother   . Stroke Brother     ALLERGIES:  is allergic to codeine and penicillins.  MEDICATIONS:  Current Outpatient Medications  Medication Sig Dispense Refill  . albuterol (PROAIR HFA) 108 (90 Base) MCG/ACT inhaler UWSE 2 PUFFS EVERY 6 HOURS AS NEEDED FOR SHORTNESS OF BREATH. 8.5 g 2  . albuterol (PROVENTIL) (2.5 MG/3ML) 0.083% nebulizer solution INHALE ONE VIAL VIA NEBULIZER EVERY 6 HOURS AS NEEDED. 375 mL 0  . ALPRAZolam (XANAX) 1 MG tablet Take 1 tablet (1 mg total) by mouth 2 (two) times daily. 60 tablet 5  . aspirin (ASPIRIN LOW DOSE) 81 MG EC tablet Take 81 mg by mouth daily.      . budesonide-formoterol (SYMBICORT) 80-4.5 MCG/ACT inhaler Inhale 2 puffs into the lungs 2 (two) times daily. 1 Inhaler 3  . celecoxib (CELEBREX) 400 MG capsule Take 1 capsule (400 mg total) by mouth daily. 30 capsule 3  . diltiazem (CARDIZEM CD) 120 MG 24 hr capsule Take 1 capsule (120 mg total) by mouth daily. 90 capsule 1  . FLUoxetine (PROZAC) 20 MG capsule Take 1 capsule (20 mg total) by mouth daily. 90 capsule 1  . furosemide (LASIX) 20 MG tablet Take 1 tablet (20 mg total) by mouth 2 (two) times daily. 180 tablet 1  . guaiFENesin (MUCINEX) 600 MG 12 hr tablet Take 1 tablet (600 mg total) by mouth 2 (two) times daily. 30 tablet 0  . HYDROcodone-acetaminophen (NORCO/VICODIN) 5-325 MG tablet TAKE ONE  TABLET THREE TIMES A DAY 90 tablet 0  . lisinopril (PRINIVIL,ZESTRIL) 10 MG tablet Take 1 tablet (10 mg total) by mouth daily. 90 tablet 3  . omeprazole (PRILOSEC) 40 MG capsule Take 1 capsule (40 mg total) by mouth daily. 90 capsule 1  . potassium chloride SA (K-DUR,KLOR-CON) 20 MEQ tablet Take 1 tablet (20 mEq total) by mouth daily. 90 tablet 1  . rosuvastatin (CRESTOR) 20  MG tablet take 1 tablet by mouth once daily 30 tablet 5  . tiZANidine (ZANAFLEX) 4 MG tablet Take 1 tablet (4 mg total) by mouth 3 (three) times daily. 270 tablet 1   No current facility-administered medications for this visit.     REVIEW OF SYSTEMS:   Constitutional: Denies fevers, chills or abnormal night sweats Eyes: Denies blurriness of vision, double vision or watery eyes Ears, nose, mouth, throat, and face: Denies mucositis or sore throat Respiratory: Has baseline cough but denies any hemoptysis.   Cardiovascular: Denies palpitation, chest discomfort or lower extremity swelling Gastrointestinal:  Denies nausea, heartburn or change in bowel habits Skin: Denies abnormal skin rashes Lymphatics: Denies new lymphadenopathy or easy bruising Neurological:Denies numbness, tingling or new weaknesses Behavioral/Psych: Mood is stable, no new changes  All other systems were reviewed with the patient and are negative.  PHYSICAL EXAMINATION: ECOG PERFORMANCE STATUS: 1 - Symptomatic but completely ambulatory  Vitals:   11/08/17 0855  BP: (!) 124/58  Pulse: 89  Resp: 20  Temp: 98.3 F (36.8 C)  SpO2: 97%   Filed Weights   11/08/17 0855  Weight: 223 lb 6.4 oz (101.3 kg)    GENERAL:alert, no distress and comfortable, morbidly obese. SKIN: skin color, texture, turgor are normal, no rashes or significant lesions EYES: normal, conjunctiva are pink and non-injected, sclera clear OROPHARYNX:no exudate, no erythema and lips, buccal mucosa, and tongue normal  NECK: supple, thyroid normal size, non-tender, without  nodularity LYMPH:  no palpable lymphadenopathy in the cervical, axillary or inguinal LUNGS: clear to auscultation and percussion with normal breathing effort HEART: regular rate & rhythm and no murmurs and no lower extremity edema ABDOMEN:abdomen soft, non-tender and normal bowel sounds Musculoskeletal:no cyanosis of digits and no clubbing  PSYCH: alert & oriented x 3 with fluent speech NEURO: no focal motor/sensory deficits  LABORATORY DATA:  I have reviewed the data as listed Lab Results  Component Value Date   WBC 6.6 10/31/2017   HGB 10.7 (L) 10/31/2017   HCT 37.1 10/31/2017   MCV 86.5 10/31/2017   PLT 251 10/31/2017     Chemistry      Component Value Date/Time   NA 136 10/31/2017 0549   K 3.5 10/31/2017 0549   CL 96 (L) 10/31/2017 0549   CO2 28 10/31/2017 0549   BUN 13 10/31/2017 0549   CREATININE 0.66 10/31/2017 0549   CREATININE 0.78 08/15/2017 0902      Component Value Date/Time   CALCIUM 8.4 (L) 10/31/2017 0549   ALKPHOS 112 10/28/2017 1702   AST 19 10/28/2017 1702   ALT 15 10/28/2017 1702   BILITOT 0.5 10/28/2017 1702       RADIOGRAPHIC STUDIES: I have personally reviewed the radiological images as listed and agreed with the findings in the report. Ct Chest W Contrast  Result Date: 10/28/2017 CLINICAL DATA:  Cough and shortness of breath for 2 weeks, abnormal chest x-ray from earlier today EXAM: CT CHEST WITH CONTRAST TECHNIQUE: Multidetector CT imaging of the chest was performed during intravenous contrast administration. CONTRAST:  53m ISOVUE-300 IOPAMIDOL (ISOVUE-300) INJECTION 61% COMPARISON:  Chest x-ray from earlier today, CT of the chest from 02/19/2017 FINDINGS: Cardiovascular: Thoracic aorta demonstrates atherosclerotic calcifications. No aneurysmal dilatation or dissection is seen. The pulmonary artery is well visualize without evidence of filling defect to suggest pulmonary embolism. Heart is not significantly enlarged. No significant coronary  calcifications are seen. Mediastinum/Nodes: The esophagus is within normal limits. Thoracic inlet is unremarkable. Scattered small mediastinal lymph nodes are  noted. The largest of these lies along the lateral aspect of the aortic arch measuring approximately 8 mm. In the right hilum tiny lymph nodes are seen not significant by size criteria. In the left hilum, there is a 3.4 x 2.9 cm soft tissue mass lesion identified. Lungs/Pleura: Multiple smaller soft tissue masses are noted throughout the left upper lobe, the largest of which measures 16 mm. These changes are most consistent with pulmonary neoplasm. Scattered changes are noted in the subpleural region of the right lower lobe best seen on image number 92 of series 4. These are likely postinflammatory in nature. No sizable effusion or pneumothorax is noted. Upper Abdomen: Diffuse fatty infiltration of the liver is noted. Gallbladder has been surgically removed. Musculoskeletal: No acute bony abnormality noted. IMPRESSION: Scattered soft tissue mass lesions throughout the left upper lobe with associated left hilar mass most consistent with pulmonary neoplasm till proven otherwise. Further evaluation by means of bronchoscopic biopsy and PET-CT are recommended. Scattered changes in the right lower lobe likely postinflammatory in nature. Electronically Signed   By: Inez Catalina M.D.   On: 10/28/2017 19:14   Mr Brain Wo Contrast  Result Date: 10/31/2017 CLINICAL DATA:  Lung cancer staging EXAM: MRI HEAD WITHOUT CONTRAST TECHNIQUE: Multiplanar, multiecho pulse sequences of the brain and surrounding structures were obtained without intravenous contrast. COMPARISON:  Brain MRI 06/16/2008 FINDINGS: Brain: The midline structures are normal. There is no acute infarct or acute hemorrhage. No mass lesion, hydrocephalus, dural abnormality or extra-axial collection. The brain parenchymal signal is normal for the patient's age. No age-advanced or lobar predominant atrophy.  No chronic microhemorrhage or superficial siderosis. Vascular: Major intracranial arterial and venous sinus flow voids are preserved. Skull and upper cervical spine: The visualized skull base, calvarium, upper cervical spine and extracranial soft tissues are normal. Sinuses/Orbits: No fluid levels or advanced mucosal thickening. No mastoid or middle ear effusion. Normal orbits. IMPRESSION: Normal noncontrast brain MRI. Please note that the lack of intravenous contrast material markedly limits sensitivity for the detection of small metastases. Electronically Signed   By: Ulyses Jarred M.D.   On: 10/31/2017 15:30   Mr Brain W Contrast  Result Date: 10/31/2017 CLINICAL DATA:  Lung cancer staging EXAM: MRI HEAD WITH CONTRAST TECHNIQUE: Multiplanar, multiecho pulse sequences of the brain and surrounding structures were obtained with intravenous contrast. CONTRAST:  27m MULTIHANCE GADOBENATE DIMEGLUMINE 529 MG/ML IV SOLN COMPARISON:  Brain MRI 06/16/2008 FINDINGS: The examination is interpreted in the context of the noncontrast MRI obtained earlier the same day. There are no intraparenchymal contrast-enhancing lesions. There is a focal area of dural based thickening and contrast enhancement measuring 9 mm at the superior left convexity (coronal image 16). No other abnormal extra-axial enhancement. No calvarial lesions. IMPRESSION: 1. Dural-based focus of extra-axial contrast enhancement along the superior left convexity. This may be a small meningioma, but a dural-based metastasis could also have this appearance. Meningioma is favored, as this was probably present on the noncontrast MRI of 06/16/2008, based on series 11 image 18 of that study. 2. No intraparenchymal lesions. Electronically Signed   By: KUlyses JarredM.D.   On: 10/31/2017 16:44   Dg Chest Port 1 View  Result Date: 10/28/2017 CLINICAL DATA:  Cough, SOB, wheezing x1 week. Hx of COPD, Former smoker- quit 1 month ago. EXAM: PORTABLE CHEST 1 VIEW  COMPARISON:  Chest x-ray dated 06/28/2014. Chest CT dated 02/19/2017. FINDINGS: Left upper lobe opacity, extending towards the left hilum, somewhat masslike in appearance at the left  hilum. Right lung is clear. Heart size and mediastinal contours are stable. No pleural effusion seen. No acute or suspicious osseous finding. IMPRESSION: Left upper lobe opacity, extending towards the left hilum, somewhat masslike in appearance at the left hilum. Findings could certainly represent pneumonia with perihilar extension and/or pneumonia with reactive perihilar lymphadenopathy. However, given the appearance and patient's smoking history, I am concerned this could represent a perihilar mass with postobstructive pneumonia or atelectasis. Recommend chest CT for further characterization. Electronically Signed   By: Franki Cabot M.D.   On: 10/28/2017 17:29   Mm Screening Breast Tomo Bilateral  Result Date: 11/07/2017 CLINICAL DATA:  Screening. EXAM: DIGITAL SCREENING BILATERAL MAMMOGRAM WITH TOMO AND CAD COMPARISON:  Previous exam(s). ACR Breast Density Category b: There are scattered areas of fibroglandular density. FINDINGS: There are no findings suspicious for malignancy. Images were processed with CAD. IMPRESSION: No mammographic evidence of malignancy. A result letter of this screening mammogram will be mailed directly to the patient. RECOMMENDATION: Screening mammogram in one year. (Code:SM-B-01Y) BI-RADS CATEGORY  1: Negative. Electronically Signed   By: Claudie Revering M.D.   On: 11/07/2017 16:40    ASSESSMENT & PLAN:  Squamous cell lung cancer, left ([place]) 1.  Newly diagnosed squamous cell carcinoma of the lung: - She presented to the hospital with breathing difficulty, CT scan of the chest on 10/28/2017 showed left hilar mass measuring 3.4 x 2.9 cm, multiple soft tissue masses in the left upper lobe, largest 1.6 cm, 54-pack-year smoking history, quit in January 2019 -Underwent bronchoscopy and biopsy on 10/30/2017  consistent with squamous cell carcinoma -MRI of brain on 10/31/2017 showing dural based focus of contrast enhancement along superior left convexity, consistent with a meningioma -I have discussed the results of the CT scan and pathology report with the patient and her husband in detail.  She has at least a T3 disease based on multiple nodules in the same lobe of the lung.  She has hilar adenopathy.  I have recommended PET/CT scan to rule out metastatic disease.  We will see her back after the PET/CT scan to discuss the results and further plan of action.  Clinically she does have at least a stage III disease at this time.  2.  Family history: The 2 of her sisters died of stomach cancer.  Father died of lung cancer.  Maternal aunt died of thyroid cancer.  Maternal grandfather had cancer in the bone.  Primary is unknown.  She could be a candidate for genetic testing.  Orders Placed This Encounter  Procedures  . NM PET Image Initial (PI) Skull Base To Thigh    Standing Status:   Future    Standing Expiration Date:   11/08/2018    Order Specific Question:   If indicated for the ordered procedure, I authorize the administration of a radiopharmaceutical per Radiology protocol    Answer:   Yes    Order Specific Question:   Preferred imaging location?    Answer:   [facility]    Order Specific Question:   Radiology Contrast Protocol - do NOT remove file path    Answer:   [email or web address]    Order Specific Question:   Reason for Exam additional comments    Answer:   lung cancer staging    All questions were answered. The patient knows to call the clinic with any problems, questions or concerns. Total time spent is 45 minutes with more than 50% of the time spent face-to-face discussing her new  diagnosis, prognosis and coordination of care.     Derek Jack, MD 11/08/2017 5:04 PM

## 2017-11-08 NOTE — Patient Instructions (Signed)
[place] at [facility] Discharge Instructions  You were seen today by Dr. Delton Coombes    Thank you for choosing [place] at [facility] to provide your oncology and hematology care.  To afford each patient quality time with our provider, please arrive at least 15 minutes before your scheduled appointment time.   If you have a lab appointment with the [place] please come in thru the  Main Entrance and check in at the main information desk  You need to re-schedule your appointment should you arrive 10 or more minutes late.  We strive to give you quality time with our providers, and arriving late affects you and other patients whose appointments are after yours.  Also, if you no show three or more times for appointments you may be dismissed from the clinic at the providers discretion.     Again, thank you for choosing [facility]  Our hope is that these requests will decrease the amount of time that you wait before being seen by our physicians.       _____________________________________________________________  Should you have questions after your visit to [facility], please contact our office at [phone] between the hours of 8:30 a.m. and 4:30 p.m.  Voicemails left after 4:30 p.m. will not be returned until the following business day.  For prescription refill requests, have your pharmacy contact our office.       Resources For Cancer Patients and their Caregivers ? American Cancer Society: Can assist with transportation, wigs, general needs, runs Look Good Feel Better.        403-702-3069 ? Cancer Care: Provides financial assistance, online support groups, medication/co-pay assistance.  1-800-813-HOPE 316-630-9567) ? [place] Assists [place] cancer patients and their families through emotional , educational and financial support.  859-717-8842 ? [place] DSS Where to  apply for food stamps, Medicaid and utility assistance. 747-406-2441 ? [facility]: Transportation to medical appointments. 432 312 4205 ? Social Security Administration: May apply for disability if have a Stage IV cancer. 8596335939 714-333-5453 ? [employer], Disability and Transit Services: Assists with nutrition, care and transit needs. [place] Support Programs:   > Cancer Support Group  2nd Tuesday of the month 1pm-2pm, Journey Room   > Creative Journey  3rd Tuesday of the month 1130am-1pm, Journey Room

## 2017-11-08 NOTE — Telephone Encounter (Signed)
Returning you call from Friday. Please call [phone]

## 2017-11-09 ENCOUNTER — Encounter: Payer: Self-pay | Admitting: Family Medicine

## 2017-11-09 DIAGNOSIS — Z09 Encounter for follow-up examination after completed treatment for conditions other than malignant neoplasm: Secondary | ICD-10-CM | POA: Insufficient documentation

## 2017-11-09 NOTE — Assessment & Plan Note (Signed)
Hospital course and lab and radiologic data reviewed in detail with [name]. Clarity regarding her new dx of squamous cell lung cancer, biopsy proven is discussed, and she is ready to move forward with further evaluation and treatment with a positive and hopeful attitude. She has quit smoking Needs repeat labs , but I advised her to hold this until the next 2 days in case she has labs to be drawn by oncology also, will follow up

## 2017-11-09 NOTE — Assessment & Plan Note (Signed)
Elevated at visit , will follow  Lisinopril held at d/c due to AKI , rept chem 7 needed

## 2017-11-09 NOTE — Progress Notes (Signed)
   [name]     MRN: [id number]      DOB: 1945-12-31   HPI [name] is here for follow up of recent hospitalization from 3/18 to 11/01/2017 when [name] was admitted for hypoxia. 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 [name] has unfortunately recently lost a sibling to lung cancer alger a long battle last year Her screening scan was normal in the Summer of 2018 and [name] presented severely hypoxic approximately 6 months later with a large mass He also was diagnosed with and treated for influenza A, [name] feels better from this and is no longer hypoxic [name] no longer smokes and does not intend to resume Had AKI and has lisinopril withheld at discharge, needs updated chem 7 to determine if [name] can resume her lisinopril, blood pressure today is initially  elevated ROS  Denies sinus pressure, nasal congestion, ear pain or sore throat. Denies  productive cough , does have  wheezing. Denies chest pains, palpitations and leg swelling Denies abdominal pain, nausea, vomiting,diarrhea or constipation.   Denies dysuria, frequency, hesitancy or incontinence. Denies uncontrolled  joint pain, swelling and limitation in mobility. Denies headaches, seizures, numbness, or tingling. Denies depression, anxiety or insomnia. Denies skin break down or rash.   PE  BP (!) 158/80   Pulse 85   Resp 17   Ht 5\' 3"  (1.6 m)   Wt 223 lb (101.2 kg)   SpO2 91%   BMI 39.50 kg/m   Patient alert and oriented and in no cardiopulmonary distress.  HEENT: No facial asymmetry, EOMI,   oropharynx pink and moist.  Neck supple no JVD, no mass.  Chest: Clear to auscultation bilaterally.decreased though adequate air entry  CVS: S1, S2 no murmurs, no S3.Regular rate.  ABD: Soft non tender.   Ext: No edema  MS:  Adequate ROM spine, shoulders, hips and knees.  Skin: Intact, no ulcerations or rash noted.  Psych: Good eye contact, normal affect. Memory intact not anxious or depressed appearing.  CNS: CN 2-12 intact, power,  normal throughout.no focal deficits noted.   [facility] discharge follow-up Hospital course and lab and radiologic data reviewed in detail with [name]. Clarity regarding her new dx of squamous cell lung cancer, biopsy proven is discussed, and [name] is ready to move forward with further evaluation and treatment with a positive and hopeful attitude. [name] has quit smoking Needs repeat labs , but I advised her to hold this until the next 2 days in case [name] has labs to be drawn by oncology also, will follow up  Essential hypertension Elevated at visit , will follow  Lisinopril held at d/c due to AKI , rept chem 7 needed

## 2017-11-11 NOTE — Telephone Encounter (Signed)
Called to let her know she needed labs done. Left message

## 2017-11-13 ENCOUNTER — Ambulatory Visit (HOSPITAL_COMMUNITY)
Admission: RE | Admit: 2017-11-13 | Discharge: 2017-11-13 | Disposition: A | Discharge status: Home / Self Care | Payer: PPO | Source: Ambulatory Visit | Attending: Hematology | Admitting: Hematology

## 2017-11-13 DIAGNOSIS — R918 Other nonspecific abnormal finding of lung field: Secondary | ICD-10-CM | POA: Diagnosis not present

## 2017-11-13 DIAGNOSIS — C3492 Malignant neoplasm of unspecified part of left bronchus or lung: Secondary | ICD-10-CM | POA: Insufficient documentation

## 2017-11-13 LAB — GLUCOSE, CAPILLARY: GLUCOSE-CAPILLARY: 119 mg/dL — AB (ref 65–99)

## 2017-11-13 MED ORDER — FLUDEOXYGLUCOSE F - 18 (FDG) INJECTION
11.5000 | Freq: Once | INTRAVENOUS | Status: AC | PRN
Start: 2017-11-13 — End: 2017-11-13
  Administered 2017-11-13: 11.5 via INTRAVENOUS

## 2017-11-14 ENCOUNTER — Encounter (HOSPITAL_COMMUNITY): Payer: Self-pay | Admitting: Internal Medicine

## 2017-11-15 ENCOUNTER — Telehealth: Payer: Self-pay | Admitting: Medical Oncology

## 2017-11-15 NOTE — Telephone Encounter (Signed)
Thanks, Diane. We will give her a call to see what her questions are.    Sharyn Lull, see below. Please give patient a call to follow-up.   Mike Craze, NP [place] [phone]

## 2017-11-15 NOTE — Telephone Encounter (Signed)
Pt missed call from office and is not sure what it is about -? appt April 8th,

## 2017-11-15 NOTE — Telephone Encounter (Signed)
Patient did not have any questions. She states she missed a call from here. I explained it may have been a robo call reminding her of her appointment on Monday. She verbalized understanding and states she is "doing well" and is "blessed".

## 2017-11-18 ENCOUNTER — Inpatient Hospital Stay (HOSPITAL_COMMUNITY): Payer: PPO | Attending: Hematology | Admitting: Hematology

## 2017-11-18 ENCOUNTER — Encounter (HOSPITAL_COMMUNITY): Payer: Self-pay | Admitting: Hematology

## 2017-11-18 ENCOUNTER — Other Ambulatory Visit: Payer: Self-pay

## 2017-11-18 VITALS — BP 164/68 | HR 113 | Resp 20 | Wt 224.2 lb

## 2017-11-18 DIAGNOSIS — M129 Arthropathy, unspecified: Secondary | ICD-10-CM | POA: Diagnosis not present

## 2017-11-18 DIAGNOSIS — M545 Low back pain: Secondary | ICD-10-CM | POA: Diagnosis not present

## 2017-11-18 DIAGNOSIS — F419 Anxiety disorder, unspecified: Secondary | ICD-10-CM | POA: Diagnosis not present

## 2017-11-18 DIAGNOSIS — Z801 Family history of malignant neoplasm of trachea, bronchus and lung: Secondary | ICD-10-CM | POA: Diagnosis not present

## 2017-11-18 DIAGNOSIS — E785 Hyperlipidemia, unspecified: Secondary | ICD-10-CM | POA: Insufficient documentation

## 2017-11-18 DIAGNOSIS — Z7982 Long term (current) use of aspirin: Secondary | ICD-10-CM | POA: Diagnosis not present

## 2017-11-18 DIAGNOSIS — Z79899 Other long term (current) drug therapy: Secondary | ICD-10-CM | POA: Insufficient documentation

## 2017-11-18 DIAGNOSIS — Z800 Family history of malignant neoplasm of digestive organs: Secondary | ICD-10-CM | POA: Diagnosis not present

## 2017-11-18 DIAGNOSIS — E669 Obesity, unspecified: Secondary | ICD-10-CM | POA: Diagnosis not present

## 2017-11-18 DIAGNOSIS — I10 Essential (primary) hypertension: Secondary | ICD-10-CM | POA: Diagnosis not present

## 2017-11-18 DIAGNOSIS — Z87891 Personal history of nicotine dependence: Secondary | ICD-10-CM | POA: Insufficient documentation

## 2017-11-18 DIAGNOSIS — C3492 Malignant neoplasm of unspecified part of left bronchus or lung: Secondary | ICD-10-CM

## 2017-11-18 DIAGNOSIS — J449 Chronic obstructive pulmonary disease, unspecified: Secondary | ICD-10-CM | POA: Diagnosis not present

## 2017-11-18 DIAGNOSIS — C3412 Malignant neoplasm of upper lobe, left bronchus or lung: Secondary | ICD-10-CM | POA: Insufficient documentation

## 2017-11-18 DIAGNOSIS — K219 Gastro-esophageal reflux disease without esophagitis: Secondary | ICD-10-CM | POA: Insufficient documentation

## 2017-11-18 DIAGNOSIS — Z808 Family history of malignant neoplasm of other organs or systems: Secondary | ICD-10-CM

## 2017-11-18 NOTE — Assessment & Plan Note (Signed)
1.  Newly diagnosed squamous cell carcinoma of the lung: - She presented to the hospital with breathing difficulty, CT scan of the chest on 10/28/2017 showed left hilar mass measuring 3.4 x 2.9 cm, multiple soft tissue masses in the left upper lobe, largest 1.6 cm, 54-pack-year smoking history, quit in January 2019 -Underwent bronchoscopy and biopsy on 10/30/2017 consistent with squamous cell carcinoma -MRI of brain on 10/31/2017 showing dural based focus of contrast enhancement along superior left convexity, consistent with a meningioma -We talked about the PET CT scan results today which did not show any evidence of metastatic disease.  She likely has a stage II/III disease.  I will make a referral to Dr. Roxan Hockey to see if she is a surgical candidate.  We will also order pulmonary function testing.  2.  Family history: The 2 of her sisters died of stomach cancer.  Father died of lung cancer.  Maternal aunt died of thyroid cancer.  Maternal grandfather had cancer in the bone.  Primary is unknown.  She could be a candidate for genetic testing.

## 2017-11-18 NOTE — Progress Notes (Signed)
[place], [place] [address]   CLINIC:  Medical Oncology/Hematology  PCP:  Fayrene Helper, MD [address] [place]  [address] [phone]   REASON FOR VISIT:  Follow-up for newly diagnosed left lung squamous cell carcinoma.  CURRENT THERAPY: None.  BRIEF ONCOLOGIC HISTORY:    Squamous cell lung cancer, left ([place])   10/28/2017 Imaging    CT scan of the chest shows left hilar mass measuring 3.4 x 2.9 cm, multiple soft tissue masses in the left upper lobe, largest measuring 1.6 cm  MRI of the brain on 10/31/2017 shows dural based focus of contrast enhancement along the superior left convexity, meningioma favored as it was present on MRI in 2009      10/30/2017 Initial Biopsy    Bronchoscopy and biopsy consistent with squamous cell cancer of the lung      11/08/2017 Family History    2 sisters died of stomach cancer Father died of lung cancer in a smoker Maternal aunt died of thyroid cancer Maternal grandfather had cancer spread to the bone        CANCER STAGING: Cancer Staging No matching staging information was found for the patient.   INTERVAL HISTORY:  Ms. Scali 72 y.o. female returns for routine follow-up of PET scan results.  She is accompanied by her husband and son.  She denies any headaches or vision changes.  She denies any hemoptysis.  She quit smoking in January of this year.  She denies any new onset pains.  No recent infections were reported.   REVIEW OF SYSTEMS:  Review of Systems  Constitutional: Positive for appetite change and fatigue.  All other systems reviewed and are negative.    PAST MEDICAL/SURGICAL HISTORY:  Past Medical History:  Diagnosis Date  . Anxiety   . Arthritis   . Chronic back pain   . COPD (chronic obstructive pulmonary disease) ([place])   . 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 GERD (gastroesophageal reflux disease) 2000  .  Hyperlipidemia 1995  . Hypertension 1995  . Nicotine addiction   . Obesity    Past Surgical History:  Procedure Laterality Date  . BACK SURGERY  1999  . BRONCHIAL BRUSHINGS Left 10/30/2017   Procedure: BRONCHIAL BRUSHINGS;  Surgeon: [name], MD;  [facility];  Service: Cardiopulmonary;  Laterality: Left;  . BRONCHIAL WASHINGS Left 10/30/2017   Procedure: BRONCHIAL WASHINGS;  Surgeon: [name], MD;  Location: [facility];  Service: Cardiopulmonary;  Laterality: Left;  . CHOLECYSTECTOMY  2001  . COLONOSCOPY N/A 02/02/2016   Procedure: COLONOSCOPY;  Surgeon: [name], MD;  Location: [facility];  Service: Endoscopy;  Laterality: N/A;  930  . FLEXIBLE BRONCHOSCOPY Bilateral 10/30/2017   Procedure: FLEXIBLE BRONCHOSCOPY;  Surgeon: [name], MD;  Location: [facility];  Service: Cardiopulmonary;  Laterality: Bilateral;  . INCISIONAL HERNIA REPAIR  August 17, 2008  . left inguinal hernia herniorrhapy  1980  . removal of thmoma  03/27/2010   Dr. Arlyce Dice   . SPINE SURGERY    . TONSILLECTOMY    . TOTAL ABDOMINAL HYSTERECTOMY W/ BILATERAL SALPINGOOPHORECTOMY  1984     SOCIAL HISTORY:  Social History   Socioeconomic History  . Marital status: Married    Spouse name: Not on file  . Number of children: Not on file  . Years of education: Not on file  . Highest  education level: Not on file  Occupational History  . 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 Financial resource strain: Not hard at all  . Food insecurity:    Worry: Never true    Inability: Never true  . Transportation needs:    Medical: No    Non-medical: No  Tobacco Use  . Smoking status: Former Smoker    Packs/day: 1.50    Years: 54.00    Pack years: 81.00    Types: Cigarettes    Last attempt to quit: 09/25/2017    Years since quitting: 0.1  . Smokeless tobacco: Never Used  Substance and Sexual Activity  . Alcohol use: No     Alcohol/week: 0.0 oz  . Drug use: No  . Sexual activity: Not Currently  Lifestyle  . Physical activity:    Days per week: 0 days    Minutes per session: 0 min  . Stress: Only a little  Relationships  . Social connections:    Talks on phone: More than three times a week    Gets together: More than three times a week    Attends religious service: More than 4 times per year    Active member of club or organization: No    Attends meetings of clubs or organizations: Never    Relationship status: Married  . Intimate partner violence:    Fear of current or ex partner: No    Emotionally abused: No    Physically abused: No    Forced sexual activity: No  Other Topics Concern  . Not on file  Social History Narrative   Pt had 2 stillborns     FAMILY HISTORY:  Family History  Problem Relation Age of Onset  . Heart disease Mother        enlarged  . Diabetes Mother   . Hypertension Mother   . Cancer Father        throat  . Hypertension Father   . Coronary artery disease Father   . Diabetes Sister        x3  . Asthma Sister   . Kidney disease Brother   . Stroke Brother     CURRENT MEDICATIONS:  Outpatient Encounter Medications as of 11/18/2017  Medication Sig  . albuterol (PROAIR HFA) 108 (90 Base) MCG/ACT inhaler UWSE 2 PUFFS EVERY 6 HOURS AS NEEDED FOR SHORTNESS OF BREATH.  Marland Kitchen albuterol (PROVENTIL) (2.5 MG/3ML) 0.083% nebulizer solution INHALE ONE VIAL VIA NEBULIZER EVERY 6 HOURS AS NEEDED.  Marland Kitchen ALPRAZolam (XANAX) 1 MG tablet Take 1 tablet (1 mg total) by mouth 2 (two) times daily.  Marland Kitchen aspirin (ASPIRIN LOW DOSE) 81 MG EC tablet Take 81 mg by mouth daily.    . budesonide-formoterol (SYMBICORT) 80-4.5 MCG/ACT inhaler Inhale 2 puffs into the lungs 2 (two) times daily.  . celecoxib (CELEBREX) 400 MG capsule Take 1 capsule (400 mg total) by mouth daily.  Marland Kitchen diltiazem (CARDIZEM CD) 120 MG 24 hr capsule Take 1 capsule (120 mg total) by mouth daily.  Marland Kitchen FLUoxetine (PROZAC) 20 MG capsule Take  1 capsule (20 mg total) by mouth daily.  . furosemide (LASIX) 20 MG tablet Take 1 tablet (20 mg total) by mouth 2 (two) times daily.  Marland Kitchen guaiFENesin (MUCINEX) 600 MG 12 hr tablet Take 1 tablet (600 mg total) by mouth 2 (two) times daily.  Marland Kitchen HYDROcodone-acetaminophen (NORCO/VICODIN) 5-325 MG tablet TAKE ONE TABLET THREE TIMES A DAY  . lisinopril (  PRINIVIL,ZESTRIL) 10 MG tablet Take 1 tablet (10 mg total) by mouth daily.  Marland Kitchen omeprazole (PRILOSEC) 40 MG capsule Take 1 capsule (40 mg total) by mouth daily.  . potassium chloride SA (K-DUR,KLOR-CON) 20 MEQ tablet Take 1 tablet (20 mEq total) by mouth daily.  . rosuvastatin (CRESTOR) 20 MG tablet take 1 tablet by mouth once daily  . tiZANidine (ZANAFLEX) 4 MG tablet Take 1 tablet (4 mg total) by mouth 3 (three) times daily.   No facility-administered encounter medications on file as of 11/18/2017.     ALLERGIES:  Allergies  Allergen Reactions  . Codeine   . Penicillins Other (See Comments)    Blisters in mouth after dental work Has patient had a PCN reaction causing immediate rash, facial/tongue/throat swelling, SOB or lightheadedness with hypotension: Yes Has patient had a PCN reaction causing severe rash involving mucus membranes or skin necrosis: No Has patient had a PCN reaction that required hospitalization No Has patient had a PCN reaction occurring within the last 10 years: Yes If all of the above answers are "NO", then may proceed with Cephalosporin use.      PHYSICAL EXAM:  ECOG Performance status: 1  Vitals:   11/18/17 1443  BP: (!) 164/68  Pulse: (!) 113  Resp: 20  SpO2: 92%   Filed Weights   11/18/17 1443  Weight: 224 lb 3.2 oz (101.7 kg)      LABORATORY DATA:  I have reviewed the labs as listed.  CBC    Component Value Date/Time   WBC 6.6 10/31/2017 0549   RBC 4.29 10/31/2017 0549   HGB 10.7 (L) 10/31/2017 0549   HCT 37.1 10/31/2017 0549   PLT 251 10/31/2017 0549   MCV 86.5 10/31/2017 0549   MCH 24.9 (L)  10/31/2017 0549   MCHC 28.8 (L) 10/31/2017 0549   RDW 16.5 (H) 10/31/2017 0549   LYMPHSABS 0.7 10/30/2017 0522   MONOABS 0.6 10/30/2017 0522   EOSABS 0.0 10/30/2017 0522   BASOSABS 0.0 10/30/2017 0522   CMP Latest Ref Rng & Units 10/31/2017 10/30/2017 10/29/2017  Glucose 65 - 99 mg/dL 157(H) 178(H) 244(H)  BUN 6 - 20 mg/dL _0 Creatinine 0.44 - 1.00 mg/dL 0.66 0.72 0.79  Sodium 135 - 145 mmol/L 136 137 138  Potassium 3.5 - 5.1 mmol/L 3.5 3.5 4.0  Chloride 101 - 111 mmol/L 96(L) 98(L) 100(L)  CO2 22 - 32 mmol/L _1 Calcium 8.9 - 10.3 mg/dL 8.4(L) 8.5(L) 8.2(L)  Total Protein 6.5 - 8.1 g/dL - - -  Total Bilirubin 0.3 - 1.2 mg/dL - - -  Alkaline Phos 38 - 126 U/L - - -  AST 15 - 41 U/L - - -  ALT 14 - 54 U/L - - -       DIAGNOSTIC IMAGING:  I have independently reviewed her PET/CT scan and agree with the findings.     ASSESSMENT & PLAN:   Squamous cell lung cancer, left ([place]) 1.  Newly diagnosed squamous cell carcinoma of the lung: - She presented to the hospital with breathing difficulty, CT scan of the chest on 10/28/2017 showed left hilar mass measuring 3.4 x 2.9 cm, multiple soft tissue masses in the left upper lobe, largest 1.6 cm, 54-pack-year smoking history, quit in January 2019 -Underwent bronchoscopy and biopsy on 10/30/2017 consistent with squamous cell carcinoma -MRI of brain on 10/31/2017 showing dural based focus of contrast enhancement along superior left convexity, consistent with a meningioma -We talked about the  PET CT scan results today which did not show any evidence of metastatic disease.  She likely has a stage II/III disease.  I will make a referral to Dr. Roxan Hockey to see if she is a surgical candidate.  We will also order pulmonary function testing.  2.  Family history: The 2 of her sisters died of stomach cancer.  Father died of lung cancer.  Maternal aunt died of thyroid cancer.  Maternal grandfather had cancer in the bone.  Primary is  unknown.  She could be a candidate for genetic testing.      Orders placed this encounter:  Orders Placed This Encounter  Procedures  . Pulmonary Function Test      Derek Jack, MD [place] [phone]

## 2017-11-20 ENCOUNTER — Other Ambulatory Visit: Payer: Self-pay | Admitting: Family Medicine

## 2017-11-20 DIAGNOSIS — Z1231 Encounter for screening mammogram for malignant neoplasm of breast: Secondary | ICD-10-CM

## 2017-11-20 DIAGNOSIS — R7301 Impaired fasting glucose: Secondary | ICD-10-CM

## 2017-11-20 DIAGNOSIS — E785 Hyperlipidemia, unspecified: Secondary | ICD-10-CM

## 2017-11-20 DIAGNOSIS — I10 Essential (primary) hypertension: Secondary | ICD-10-CM

## 2017-11-21 ENCOUNTER — Telehealth: Payer: Self-pay | Admitting: Family Medicine

## 2017-11-21 NOTE — Telephone Encounter (Signed)
Celebrex was reordered yesterday and Xanax is not due

## 2017-11-21 NOTE — Telephone Encounter (Signed)
Patient is requesting a refill for her xanex and Celebrex.  Pharmacy: [facility] on [address]. Cb#: [phone]

## 2017-11-25 ENCOUNTER — Other Ambulatory Visit (HOSPITAL_COMMUNITY): Payer: Self-pay | Admitting: Emergency Medicine

## 2017-11-25 ENCOUNTER — Telehealth: Payer: Self-pay | Admitting: Family Medicine

## 2017-11-25 ENCOUNTER — Other Ambulatory Visit: Payer: Self-pay

## 2017-11-25 ENCOUNTER — Telehealth (HOSPITAL_COMMUNITY): Payer: Self-pay | Admitting: Emergency Medicine

## 2017-11-25 DIAGNOSIS — Z1231 Encounter for screening mammogram for malignant neoplasm of breast: Secondary | ICD-10-CM

## 2017-11-25 DIAGNOSIS — R7301 Impaired fasting glucose: Secondary | ICD-10-CM

## 2017-11-25 DIAGNOSIS — E785 Hyperlipidemia, unspecified: Secondary | ICD-10-CM

## 2017-11-25 DIAGNOSIS — I10 Essential (primary) hypertension: Secondary | ICD-10-CM

## 2017-11-25 MED ORDER — ROSUVASTATIN CALCIUM 20 MG PO TABS: 20.0000 mg | ORAL_TABLET | Freq: Every day | ORAL | 5 refills | Status: DC

## 2017-11-25 NOTE — Telephone Encounter (Signed)
Med refilled.

## 2017-11-25 NOTE — Telephone Encounter (Signed)
RT from [facility] called to verify what PFT test that Dr Raliegh Ip wanted.  Spoke with Dr Raliegh Ip and he wants Full PFTs.

## 2017-11-25 NOTE — Telephone Encounter (Signed)
Patient is requesting a refill for crestor (generic) [facility] on [address]

## 2017-11-27 ENCOUNTER — Telehealth: Payer: Self-pay | Admitting: Family Medicine

## 2017-11-27 NOTE — Telephone Encounter (Signed)
Patient called in to request a call from you today. She would not give details. Cb#: [phone]

## 2017-11-28 NOTE — Telephone Encounter (Signed)
Advised to get her path records from [facility]

## 2017-12-05 ENCOUNTER — Ambulatory Visit (HOSPITAL_COMMUNITY): Payer: PPO

## 2017-12-09 ENCOUNTER — Other Ambulatory Visit: Payer: Self-pay | Admitting: Family Medicine

## 2017-12-11 ENCOUNTER — Other Ambulatory Visit: Payer: Self-pay | Admitting: Family Medicine

## 2017-12-12 ENCOUNTER — Ambulatory Visit (HOSPITAL_COMMUNITY): Admission: RE | Admit: 2017-12-12 | Payer: PPO | Source: Ambulatory Visit

## 2017-12-19 ENCOUNTER — Ambulatory Visit (HOSPITAL_COMMUNITY)
Admission: RE | Admit: 2017-12-19 | Discharge: 2017-12-19 | Disposition: A | Discharge status: Home / Self Care | Payer: PPO | Source: Ambulatory Visit | Attending: Hematology | Admitting: Hematology

## 2017-12-19 DIAGNOSIS — C3492 Malignant neoplasm of unspecified part of left bronchus or lung: Secondary | ICD-10-CM | POA: Insufficient documentation

## 2017-12-19 LAB — PULMONARY FUNCTION TEST
DL/VA % PRED: 71 %
DL/VA: 3.44 ml/min/mmHg/L
DLCO UNC % PRED: 44 %
DLCO UNC: 10.86 ml/min/mmHg
FEF 25-75 POST: 0.89 L/s
FEF 25-75 Pre: 0.74 L/sec
FEF2575-%Change-Post: 19 %
FEF2575-%PRED-POST: 51 %
FEF2575-%Pred-Pre: 43 %
FEV1-%CHANGE-POST: 3 %
FEV1-%PRED-PRE: 78 %
FEV1-%Pred-Post: 81 %
FEV1-Post: 1.5 L
FEV1-Pre: 1.44 L
FEV1FVC-%Change-Post: 10 %
FEV1FVC-%PRED-PRE: 86 %
FEV6-%CHANGE-POST: -1 %
FEV6-%PRED-POST: 89 %
FEV6-%Pred-Pre: 90 %
FEV6-POST: 2.04 L
FEV6-Pre: 2.08 L
FEV6FVC-%CHANGE-POST: 4 %
FEV6FVC-%PRED-POST: 104 %
FEV6FVC-%Pred-Pre: 100 %
FVC-%Change-Post: -5 %
FVC-%PRED-POST: 85 %
FVC-%Pred-Pre: 90 %
FVC-Post: 2.04 L
FVC-Pre: 2.16 L
POST FEV1/FVC RATIO: 73 %
PRE FEV1/FVC RATIO: 67 %
PRE FEV6/FVC RATIO: 96 %
Post FEV6/FVC ratio: 100 %
RV % pred: 115 %
RV: 2.52 L
TLC % PRED: 93 %
TLC: 4.71 L

## 2017-12-19 MED ORDER — ALBUTEROL SULFATE (2.5 MG/3ML) 0.083% IN NEBU
2.5000 mg | INHALATION_SOLUTION | Freq: Once | RESPIRATORY_TRACT | Status: AC
  Administered 2017-12-19: 2.5 mg via RESPIRATORY_TRACT

## 2017-12-21 ENCOUNTER — Other Ambulatory Visit: Payer: Self-pay | Admitting: Family Medicine

## 2017-12-21 DIAGNOSIS — Z1231 Encounter for screening mammogram for malignant neoplasm of breast: Secondary | ICD-10-CM

## 2017-12-21 DIAGNOSIS — E785 Hyperlipidemia, unspecified: Secondary | ICD-10-CM

## 2017-12-21 DIAGNOSIS — R7301 Impaired fasting glucose: Secondary | ICD-10-CM

## 2017-12-21 DIAGNOSIS — I10 Essential (primary) hypertension: Secondary | ICD-10-CM

## 2018-01-04 ENCOUNTER — Other Ambulatory Visit: Payer: Self-pay | Admitting: Family Medicine

## 2018-01-08 ENCOUNTER — Institutional Professional Consult (permissible substitution): Payer: PPO | Admitting: Thoracic Surgery (Cardiothoracic Vascular Surgery)

## 2018-01-08 ENCOUNTER — Encounter: Payer: Self-pay | Admitting: Thoracic Surgery (Cardiothoracic Vascular Surgery)

## 2018-01-08 ENCOUNTER — Other Ambulatory Visit: Payer: Self-pay

## 2018-01-08 VITALS — BP 144/83 | HR 114 | Resp 18 | Ht 63.0 in | Wt 230.0 lb

## 2018-01-08 DIAGNOSIS — R918 Other nonspecific abnormal finding of lung field: Secondary | ICD-10-CM

## 2018-01-08 DIAGNOSIS — C3492 Malignant neoplasm of unspecified part of left bronchus or lung: Secondary | ICD-10-CM

## 2018-01-08 NOTE — Progress Notes (Signed)
PCP is Fayrene Helper, MD Referring Provider is Derek Jack, MD  Chief Complaint  Patient presents with  . Lung Cancer    new patient, PET 11/13/2017, Chest CT 10/28/2017...eval for possible resection. PFT 11/18/17    HPI:  Mrs. Hilgert is sent for consultation regarding a left upper lobe lung mass  [name] is a 72 year old woman with a history of tobacco abuse, COPD, hypertension, hyperlipidemia, morbid obesity, chronic back pain, arthritis, and anxiety.  She smoked about 1-1/2 to 2 packs of cigarettes daily from age [age].  She quit smoking in January of this year.  Earlier this year she was ill with the flu.  That progressed to pneumonia.  She presented with a complaint of shortness of breath and was admitted to the hospital.  A CT of the chest showed a left hilar mass and multiple other left upper lobe nodules.  Bronchoscopy on 10/30/2017 showed squamous cell carcinoma of the lung.  Molecular testing showed no PDL 1 expression.  She saw Dr. Delton Coombes.  A PET/CT was done which showed marked hypermetabolic activity in the left upper lobe mass and probably a hilar node.  There is no mediastinal activity or any evidence of distant metastases. MRI of the brain showed a small dural based lesion.  They could not rule out metastasis but thought it might be a meningioma.  She is self-employed doing Engineer, petroleum and taxes.  She is limited in exercise.  She uses inhalers for COPD.  She can walk up a flight of stairs but would have to stop to catch her breath at the top.  She has not had any change in appetite or weight loss.  She has not had any unusual headaches or visual changes.  Zubrod Score: At the time of surgery this patient's most appropriate activity status/level should be described as: _0     0    Normal activity, no symptoms _1     1    Restricted in physical strenuous activity but ambulatory, able to do out light work _2     2    Ambulatory and capable of self care, unable to do  work activities, up and about >50 % of waking hours                              _3     3    Only limited self care, in bed greater than 50% of waking hours _4     4    Completely disabled, no self care, confined to bed or chair _5     5    Moribund  Past Medical History:  Diagnosis Date  . Anxiety   . Arthritis   . Chronic back pain   . COPD (chronic obstructive pulmonary disease) ([place])   . 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 GERD (gastroesophageal reflux disease) 2000  . Hyperlipidemia 1995  . Hypertension 1995  . Nicotine addiction   . Obesity     Past Surgical History:  Procedure Laterality Date  . BACK SURGERY  1999  . BRONCHIAL BRUSHINGS Left 10/30/2017   Procedure: BRONCHIAL BRUSHINGS;  Surgeon: [name], MD;  [facility];  Service: Cardiopulmonary;  Laterality: Left;  . BRONCHIAL WASHINGS Left 10/30/2017   Procedure: BRONCHIAL WASHINGS;  Surgeon: [name], MD;  [facility];  Service: Cardiopulmonary;  Laterality: Left;  . CHOLECYSTECTOMY  2001  .  COLONOSCOPY N/A 02/02/2016   Procedure: COLONOSCOPY;  Surgeon: [name], MD;  Location: [facility];  Service: Endoscopy;  Laterality: N/A;  930  . FLEXIBLE BRONCHOSCOPY Bilateral 10/30/2017   Procedure: FLEXIBLE BRONCHOSCOPY;  Surgeon: [name], MD;  [facility]: [facility];  Service: Cardiopulmonary;  Laterality: Bilateral;  . INCISIONAL HERNIA REPAIR  August 17, 2008  . left inguinal hernia herniorrhapy  1980  . removal of thmoma  03/27/2010   Dr. Arlyce Dice   . SPINE SURGERY    . TONSILLECTOMY    . TOTAL ABDOMINAL HYSTERECTOMY W/ BILATERAL SALPINGOOPHORECTOMY  1984    Family History  Problem Relation Age of Onset  . Heart disease Mother        enlarged  . Diabetes Mother   . Hypertension Mother   . Cancer Father        throat  . Hypertension Father   . Coronary artery disease Father   . Diabetes Sister        x3  . Asthma Sister    . Kidney disease Brother   . Stroke Brother     Social History Social History   Tobacco Use  . Smoking status: Former Smoker    Packs/day: 1.50    Years: 54.00    Pack years: 81.00    Types: Cigarettes    Last attempt to quit: 08/20/2017    Years since quitting: 0.3  . Smokeless tobacco: Never Used  Substance Use Topics  . Alcohol use: No    Alcohol/week: 0.0 oz  . Drug use: No    Current Outpatient Medications  Medication Sig Dispense Refill  . albuterol (PROVENTIL HFA;VENTOLIN HFA) 108 (90 Base) MCG/ACT inhaler INHALE 2 PUFFS BY MOUTH EVERY 6 HOURS IF NEEDED 8.5 g 0  . albuterol (PROVENTIL) (2.5 MG/3ML) 0.083% nebulizer solution INHALE ONE VIAL VIA NEBULIZER EVERY 6 HOURS AS NEEDED. 375 mL 0  . ALPRAZolam (XANAX) 1 MG tablet Take 1 tablet (1 mg total) by mouth 2 (two) times daily. 60 tablet 5  . aspirin (ASPIRIN LOW DOSE) 81 MG EC tablet Take 81 mg by mouth daily.      . celecoxib (CELEBREX) 400 MG capsule TAKE ONE CAPSULE BY MOUTH ONCE DAILY 30 capsule 5  . diltiazem (CARDIZEM CD) 120 MG 24 hr capsule TAKE 1 TABLET ONCE DAILY 90 capsule 1  . FLUoxetine (PROZAC) 20 MG capsule TAKE 1 TABLET ONCE DAILY 90 capsule 1  . furosemide (LASIX) 20 MG tablet Take 1 tablet (20 mg total) by mouth 2 (two) times daily. 180 tablet 1  . guaiFENesin (MUCINEX) 600 MG 12 hr tablet Take 1 tablet (600 mg total) by mouth 2 (two) times daily. 30 tablet 0  . HYDROcodone-acetaminophen (NORCO/VICODIN) 5-325 MG tablet TAKE ONE TABLET THREE TIMES A DAY 90 tablet 0  . lisinopril (PRINIVIL,ZESTRIL) 10 MG tablet Take 1 tablet (10 mg total) by mouth daily. 90 tablet 3  . omeprazole (PRILOSEC) 40 MG capsule TAKE 1 CAPSULE ONCE DAILY 90 capsule 1  . potassium chloride SA (K-DUR,KLOR-CON) 20 MEQ tablet Take 1 tablet (20 mEq total) by mouth daily. 90 tablet 1  . rosuvastatin (CRESTOR) 20 MG tablet Take 1 tablet (20 mg total) by mouth daily. 30 tablet 5  . SYMBICORT 80-4.5 MCG/ACT inhaler INHALE 2 PUFFS BY MOUTH  TWICE DAILY. RINSE MOUTH AFTER USE 10.2 g 0  . tiZANidine (ZANAFLEX) 4 MG tablet Take 1 tablet (4 mg total) by mouth 3 (three) times daily. 270 tablet 1  No current facility-administered medications for this visit.     Allergies  Allergen Reactions  . Codeine   . Penicillins Other (See Comments)    Blisters in mouth after dental work Has patient had a PCN reaction causing immediate rash, facial/tongue/throat swelling, SOB or lightheadedness with hypotension: Yes Has patient had a PCN reaction causing severe rash involving mucus membranes or skin necrosis: No Has patient had a PCN reaction that required hospitalization No Has patient had a PCN reaction occurring within the last 10 years: Yes If all of the above answers are "NO", then may proceed with Cephalosporin use.     Review of Systems  Constitutional: Negative for activity change, appetite change and unexpected weight change.  HENT: Negative for trouble swallowing and voice change.   Eyes: Negative for visual disturbance.  Respiratory: Positive for cough and shortness of breath. Negative for wheezing.   Cardiovascular: Negative for chest pain and leg swelling.  Gastrointestinal: Negative for abdominal pain.  Genitourinary: Negative for difficulty urinating and dysuria.  Musculoskeletal: Positive for arthralgias, back pain and gait problem (Legs give way).  Neurological: Negative for seizures, syncope and weakness.  Hematological: Negative for adenopathy. Does not bruise/bleed easily.  All other systems reviewed and are negative.   BP (!) 144/83 (BP Location: Right Arm, Patient Position: Sitting, Cuff Size: Large)   Pulse (!) 114   Resp 18   Ht _0  (1.6 m)   Wt 230 lb (104.3 kg)   SpO2 93% Comment: RA  BMI 40.74 kg/m  Physical Exam  Constitutional: She is oriented to person, place, and time. No distress.  Morbidly obese  HENT:  Head: Normocephalic and atraumatic.  Mouth/Throat: No oropharyngeal exudate.  Eyes:  Pupils are equal, round, and reactive to light. Conjunctivae and EOM are normal. No scleral icterus.  Neck: Normal range of motion. Neck supple. No thyromegaly present.  Cardiovascular: Regular rhythm and normal heart sounds. Exam reveals no gallop and no friction rub.  No murmur heard. Tachycardic  Pulmonary/Chest: Effort normal and breath sounds normal. No stridor. No respiratory distress. She has no wheezes.  Abdominal: Soft. Bowel sounds are normal. She exhibits no distension.  Musculoskeletal: She exhibits no edema or deformity.  Lymphadenopathy:    She has no cervical adenopathy.  Neurological: She is alert and oriented to person, place, and time. No cranial nerve deficit. She exhibits normal muscle tone. Coordination normal.  Skin: Skin is warm and dry.  Psychiatric: She has a normal mood and affect.  Vitals reviewed.    Diagnostic Tests: CT CHEST WITH CONTRAST  TECHNIQUE: Multidetector CT imaging of the chest was performed during intravenous contrast administration.  CONTRAST:  82m ISOVUE-300 IOPAMIDOL (ISOVUE-300) INJECTION 61%  COMPARISON:  Chest x-ray from earlier today, CT of the chest from 02/19/2017  FINDINGS: Cardiovascular: Thoracic aorta demonstrates atherosclerotic calcifications. No aneurysmal dilatation or dissection is seen. The pulmonary artery is well visualize without evidence of filling defect to suggest pulmonary embolism. Heart is not significantly enlarged. No significant coronary calcifications are seen.  Mediastinum/Nodes: The esophagus is within normal limits. Thoracic inlet is unremarkable. Scattered small mediastinal lymph nodes are noted. The largest of these lies along the lateral aspect of the aortic arch measuring approximately 8 mm. In the right hilum tiny lymph nodes are seen not significant by size criteria. In the left hilum, there is a 3.4 x 2.9 cm soft tissue mass lesion identified.  Lungs/Pleura: Multiple smaller soft  tissue masses are noted throughout the left upper lobe, the largest  of which measures 16 mm. These changes are most consistent with pulmonary neoplasm. Scattered changes are noted in the subpleural region of the right lower lobe best seen on image number 92 of series 4. These are likely postinflammatory in nature. No sizable effusion or pneumothorax is noted.  Upper Abdomen: Diffuse fatty infiltration of the liver is noted. Gallbladder has been surgically removed.  Musculoskeletal: No acute bony abnormality noted.  IMPRESSION: Scattered soft tissue mass lesions throughout the left upper lobe with associated left hilar mass most consistent with pulmonary neoplasm till proven otherwise. Further evaluation by means of bronchoscopic biopsy and PET-CT are recommended.  Scattered changes in the right lower lobe likely postinflammatory in nature.   Electronically Signed   By: Inez Catalina M.D.   On: 10/28/2017 19:14 NUCLEAR MEDICINE PET SKULL BASE TO THIGH  TECHNIQUE: 11.5 mCi F-18 FDG was injected intravenously. Full-ring PET imaging was performed from the skull base to thigh after the radiotracer. CT data was obtained and used for attenuation correction and anatomic localization.  Fasting blood glucose: 119 mg/dl  COMPARISON:  CT 10/28/2017  FINDINGS: Mediastinal blood pool activity: SUV max 3.2  NECK: No hypermetabolic lymph nodes in the neck.  Incidental CT findings: none  CHEST: Lobular mass extending from the superior LEFT hilum into the LEFT upper lobe is intensely metabolic with high SUV max equal 21.2. Masses is comprised of multiple consolidative round lesions which coalesce. The largest lesion is at the hilum measuring 2.8 cm. Small lesions more superiorly measure 1.6 cm.  No hypermetabolic mediastinal lymph nodes.  No additional hypermetabolic pulmonary nodules.  Incidental CT findings: none  ABDOMEN/PELVIS: No abnormal hypermetabolic  activity within the liver, pancreas, adrenal glands, or spleen. No hypermetabolic lymph nodes in the abdomen or pelvis.  Incidental CT findings: Post hysterectomy. Ventral abdominal hernia repair.  SKELETON: No focal hypermetabolic activity to suggest skeletal metastasis.  Incidental CT findings: none  IMPRESSION: 1. Hypermetabolic lobular mass extending from the LEFT hilum is most consistent with bronchogenic carcinoma. Recommend bronchoscopy for sampling. 2. No hypermetabolic mediastinal lymph nodes. 3. No evidence distant metastatic disease.   Electronically Signed   By: Suzy Bouchard M.D.   On: 11/13/2017 15:50 Pulmonary function testing FVC 2.16 (90%) FEV1 1.44 (78%) FEV1 1.50 (81%) postbronchodilator TLC 4.71 (93%) DLCO 10.86 (44%)  I personally reviewed the CT and PET/CT images and concur with the findings noted above  Impression: [name] is a 72 year old woman with a history of tobacco abuse until about 4 months ago, COPD, arthritis, chronic back pain, and morbid obesity.  She was found to have a left upper lobe mass in March after presenting with shortness of breath.  There was a 3.4 x 2.9 cm left hilar mass with multiple satellite nodules in the same lobe.  Biopsies showed squamous cell carcinoma.  By CT and PET she has T3, N1, M0, stage IIIA disease.  There was no PDL 1 expression.  Reviewing her CT scan, the mass abuts the main pulmonary artery over a large area.  On CT and PET there appears to be level 11 lymph node involvement.  Complete resection would require a pneumonectomy.  Her FEV1 would be marginal but probably sufficient to tolerate a pneumonectomy, however her diffusion capacity is only 44% and I do not think she would tolerate removal of an entire lung.  I would not recommend pneumonectomy.  I discussed these issues with Mrs. Heng and her family.  With stage IIIA disease the standard of care is  chemo and radiation.  We will do surgery in  selected cases that are favorable, but I personally would not do a pneumonectomy for stage IIIA disease.   If she has a favorable response to chemotherapy and radiation we could reconsider our resection, although I think it is unlikely that the tumor will shrink enough to be resectable with a lobectomy.  Tobacco abuse-quit smoking in January of this year.  Plan: She will follow-up with Dr. Delton Coombes discuss chemo and radiation  Melrose Nakayama, MD [facility] [phone]

## 2018-01-21 ENCOUNTER — Other Ambulatory Visit: Payer: Self-pay

## 2018-01-21 ENCOUNTER — Encounter (HOSPITAL_COMMUNITY): Payer: Self-pay | Admitting: Hematology

## 2018-01-21 ENCOUNTER — Inpatient Hospital Stay (HOSPITAL_COMMUNITY): Payer: PPO | Attending: Hematology | Admitting: Hematology

## 2018-01-21 DIAGNOSIS — M549 Dorsalgia, unspecified: Secondary | ICD-10-CM | POA: Diagnosis not present

## 2018-01-21 DIAGNOSIS — Z79899 Other long term (current) drug therapy: Secondary | ICD-10-CM | POA: Insufficient documentation

## 2018-01-21 DIAGNOSIS — K219 Gastro-esophageal reflux disease without esophagitis: Secondary | ICD-10-CM | POA: Diagnosis not present

## 2018-01-21 DIAGNOSIS — C3492 Malignant neoplasm of unspecified part of left bronchus or lung: Secondary | ICD-10-CM

## 2018-01-21 DIAGNOSIS — C3412 Malignant neoplasm of upper lobe, left bronchus or lung: Secondary | ICD-10-CM | POA: Insufficient documentation

## 2018-01-21 DIAGNOSIS — Z87891 Personal history of nicotine dependence: Secondary | ICD-10-CM | POA: Insufficient documentation

## 2018-01-21 DIAGNOSIS — F419 Anxiety disorder, unspecified: Secondary | ICD-10-CM

## 2018-01-21 DIAGNOSIS — G8929 Other chronic pain: Secondary | ICD-10-CM

## 2018-01-21 DIAGNOSIS — Z801 Family history of malignant neoplasm of trachea, bronchus and lung: Secondary | ICD-10-CM | POA: Insufficient documentation

## 2018-01-21 DIAGNOSIS — F17210 Nicotine dependence, cigarettes, uncomplicated: Secondary | ICD-10-CM | POA: Insufficient documentation

## 2018-01-21 DIAGNOSIS — I10 Essential (primary) hypertension: Secondary | ICD-10-CM | POA: Insufficient documentation

## 2018-01-21 DIAGNOSIS — M129 Arthropathy, unspecified: Secondary | ICD-10-CM | POA: Insufficient documentation

## 2018-01-21 DIAGNOSIS — Z800 Family history of malignant neoplasm of digestive organs: Secondary | ICD-10-CM | POA: Insufficient documentation

## 2018-01-21 DIAGNOSIS — J449 Chronic obstructive pulmonary disease, unspecified: Secondary | ICD-10-CM | POA: Insufficient documentation

## 2018-01-21 DIAGNOSIS — E669 Obesity, unspecified: Secondary | ICD-10-CM | POA: Insufficient documentation

## 2018-01-21 DIAGNOSIS — M545 Low back pain: Secondary | ICD-10-CM | POA: Insufficient documentation

## 2018-01-21 NOTE — Assessment & Plan Note (Addendum)
1.  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  She was evaluated by Dr. Roxan Hockey on 01/08/2018 and felt not to be a surgical candidate. -We talked about combination chemoradiation therapy with carboplatin and paclitaxel delivered on a weekly basis for 5 to 6 weeks.  We will make a referral to Dr. Quitman Livings in [place] for radiation therapy.  We talked about side effects of chemotherapy regimen in detail.  We have made a referral for port placement.  2.  Family history: The 2 of her sisters died of stomach cancer.  Father died of lung cancer.  Maternal aunt died of thyroid cancer.  Maternal grandfather had cancer in the bone.  Primary is unknown.  We will consider genetic testing.

## 2018-01-21 NOTE — Patient Instructions (Signed)
[facility] at [facility] Discharge Instructions  Today you saw Dr. K.   Thank you for choosing [facility] at [facility] to provide your oncology and hematology care.  To afford each patient quality time with our provider, please arrive at least 15 minutes before your scheduled appointment time.   If you have a lab appointment with the Cancer Center please come in thru the  Main Entrance and check in at the main information desk  You need to re-schedule your appointment should you arrive 10 or more minutes late.  We strive to give you quality time with our providers, and arriving late affects you and other patients whose appointments are after yours.  Also, if you no show three or more times for appointments you may be dismissed from the clinic at the providers discretion.     Again, thank you for choosing [facility].  Our hope is that these requests will decrease the amount of time that you wait before being seen by our physicians.       _____________________________________________________________  Should you have questions after your visit to [facility], please contact our office at [phone] between the hours of 8:30 a.m. and 4:30 p.m.  Voicemails left after 4:30 p.m. will not be returned until the following business day.  For prescription refill requests, have your pharmacy contact our office.       Resources For Cancer Patients and their Caregivers ? American Cancer Society: Can assist with transportation, wigs, general needs, runs Look Good Feel Better.        1-888-227-6333 ? Cancer Care: Provides financial assistance, online support groups, medication/co-pay assistance.  1-800-813-HOPE (4673) ? [facility] Assists [place] cancer patients and their families through emotional , educational and financial support.  336-427-4357 ? [place] DSS Where to apply for food  stamps, Medicaid and utility assistance. 336-342-1394 ? [facility]: Transportation to medical appointments. 336-347-2287 ? Social Security Administration: May apply for disability if have a Stage IV cancer. 336-342-7796 1-800-772-1213 ? [place] Aging, Disability and Transit Services: Assists with nutrition, care and transit needs. 336-349-2343  Cancer Center Support Programs:   > Cancer Support Group  2nd Tuesday of the month 1pm-2pm, Journey Room   > Creative Journey  3rd Tuesday of the month 1130am-1pm, Journey Room    

## 2018-01-21 NOTE — Progress Notes (Signed)
[place], [place] [address]   CLINIC:  Medical Oncology/Hematology  PCP:  Fayrene Helper, MD [address], [place] [address] [phone]   REASON FOR VISIT:  Follow-up for stage III lung cancer.  CURRENT THERAPY: Combination chemoradiation therapy being planned.  BRIEF ONCOLOGIC HISTORY:    Squamous cell lung cancer, left ([facility])   10/28/2017 Imaging    CT scan of the chest shows left hilar mass measuring 3.4 x 2.9 cm, multiple soft tissue masses in the left upper lobe, largest measuring 1.6 cm  MRI of the brain on 10/31/2017 shows dural based focus of contrast enhancement along the superior left convexity, meningioma favored as it was present on MRI in 2009      10/30/2017 Initial Biopsy    Bronchoscopy and biopsy consistent with squamous cell cancer of the lung      11/08/2017 Family History    2 sisters died of stomach cancer Father died of lung cancer in a smoker Maternal aunt died of thyroid cancer Maternal grandfather had cancer spread to the bone        CANCER STAGING: Cancer Staging No matching staging information was found for the patient.   INTERVAL HISTORY:  [name] 72 y.o. female returns for follow-up after she saw Dr. Roxan Hockey.  She denies any cough or hemoptysis.  She does have some shortness of breath on exertion and fatigue.  She has lost about 10 pounds in the last 6 to 8 weeks.  She does have chronic back pain which is stable.  She is accompanied by her husband today.  Able to do all household activities.  Reports energy levels are 75% and appetite is 75%.   REVIEW OF SYSTEMS:  Review of Systems  Constitutional: Positive for fatigue.  Respiratory: Positive for shortness of breath.   Musculoskeletal: Positive for back pain.  All other systems reviewed and are negative.    PAST MEDICAL/SURGICAL HISTORY:  Past Medical History:  Diagnosis Date  . Anxiety   . Arthritis   . Chronic  back pain   . COPD (chronic obstructive pulmonary disease) ([facility])   . 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 GERD (gastroesophageal reflux disease) 2000  . Hyperlipidemia 1995  . Hypertension 1995  . Nicotine addiction   . Obesity    Past Surgical History:  Procedure Laterality Date  . BACK SURGERY  1999  . BRONCHIAL BRUSHINGS Left 10/30/2017   Procedure: BRONCHIAL BRUSHINGS;  Surgeon: [name], MD;  [facility]: [facility];  Service: Cardiopulmonary;  Laterality: Left;  . BRONCHIAL WASHINGS Left 10/30/2017   Procedure: BRONCHIAL WASHINGS;  Surgeon: [name], MD;  Location: [facility];  Service: Cardiopulmonary;  Laterality: Left;  . CHOLECYSTECTOMY  2001  . COLONOSCOPY N/A 02/02/2016   Procedure: COLONOSCOPY;  Surgeon: [name], MD;  Location: [facility];  Service: Endoscopy;  Laterality: N/A;  930  . FLEXIBLE BRONCHOSCOPY Bilateral 10/30/2017   Procedure: FLEXIBLE BRONCHOSCOPY;  Surgeon: [name], MD;  Location: [facility];  Service: Cardiopulmonary;  Laterality: Bilateral;  . INCISIONAL HERNIA REPAIR  August 17, 2008  . left inguinal hernia herniorrhapy  1980  . removal of thmoma  03/27/2010   Dr. Arlyce Dice   . SPINE SURGERY    . TONSILLECTOMY    . TOTAL ABDOMINAL HYSTERECTOMY W/ BILATERAL SALPINGOOPHORECTOMY  1984     SOCIAL HISTORY:  Social History  Socioeconomic History  . Marital status: Married    Spouse name: Not on file  . Number of children: Not on file  . Years of education: Not on file  . Highest education level: Not on file  Occupational History  . 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 Financial resource strain: Not hard at all  . Food insecurity:    Worry: Never true    Inability: Never true  . Transportation needs:    Medical: No    Non-medical: No  Tobacco Use  . Smoking status: Former Smoker    Packs/day: 1.50    Years: 54.00     Pack years: 81.00    Types: Cigarettes    Last attempt to quit: 08/20/2017    Years since quitting: 0.4  . Smokeless tobacco: Never Used  Substance and Sexual Activity  . Alcohol use: No    Alcohol/week: 0.0 oz  . Drug use: No  . Sexual activity: Not Currently  Lifestyle  . Physical activity:    Days per week: 0 days    Minutes per session: 0 min  . Stress: Only a little  Relationships  . Social connections:    Talks on phone: More than three times a week    Gets together: More than three times a week    Attends religious service: More than 4 times per year    Active member of club or organization: No    Attends meetings of clubs or organizations: Never    Relationship status: Married  . Intimate partner violence:    Fear of current or ex partner: No    Emotionally abused: No    Physically abused: No    Forced sexual activity: No  Other Topics Concern  . Not on file  Social History Narrative   Pt had 2 stillborns     FAMILY HISTORY:  Family History  Problem Relation Age of Onset  . Heart disease Mother        enlarged  . Diabetes Mother   . Hypertension Mother   . Cancer Father        throat  . Hypertension Father   . Coronary artery disease Father   . Diabetes Sister        x3  . Asthma Sister   . Kidney disease Brother   . Stroke Brother     CURRENT MEDICATIONS:  Outpatient Encounter Medications as of 01/21/2018  Medication Sig  . albuterol (PROVENTIL HFA;VENTOLIN HFA) 108 (90 Base) MCG/ACT inhaler INHALE 2 PUFFS BY MOUTH EVERY 6 HOURS IF NEEDED  . albuterol (PROVENTIL) (2.5 MG/3ML) 0.083% nebulizer solution INHALE ONE VIAL VIA NEBULIZER EVERY 6 HOURS AS NEEDED.  Marland Kitchen ALPRAZolam (XANAX) 1 MG tablet Take 1 tablet (1 mg total) by mouth 2 (two) times daily.  Marland Kitchen aspirin (ASPIRIN LOW DOSE) 81 MG EC tablet Take 81 mg by mouth daily.    . celecoxib (CELEBREX) 400 MG capsule TAKE ONE CAPSULE BY MOUTH ONCE DAILY  . diltiazem (CARDIZEM CD) 120 MG 24 hr capsule TAKE 1  TABLET ONCE DAILY  . FLUoxetine (PROZAC) 20 MG capsule TAKE 1 TABLET ONCE DAILY  . furosemide (LASIX) 20 MG tablet Take 1 tablet (20 mg total) by mouth 2 (two) times daily.  Marland Kitchen guaiFENesin (MUCINEX) 600 MG 12 hr tablet Take 1 tablet (600 mg total) by mouth 2 (two) times daily.  Marland Kitchen HYDROcodone-acetaminophen (NORCO/VICODIN) 5-325 MG tablet TAKE ONE TABLET THREE  TIMES A DAY  . lisinopril (PRINIVIL,ZESTRIL) 10 MG tablet Take 1 tablet (10 mg total) by mouth daily.  Marland Kitchen omeprazole (PRILOSEC) 40 MG capsule TAKE 1 CAPSULE ONCE DAILY  . potassium chloride SA (K-DUR,KLOR-CON) 20 MEQ tablet Take 1 tablet (20 mEq total) by mouth daily.  . rosuvastatin (CRESTOR) 20 MG tablet Take 1 tablet (20 mg total) by mouth daily.  . SYMBICORT 80-4.5 MCG/ACT inhaler INHALE 2 PUFFS BY MOUTH TWICE DAILY. RINSE MOUTH AFTER USE  . tiZANidine (ZANAFLEX) 4 MG tablet Take 1 tablet (4 mg total) by mouth 3 (three) times daily.   No facility-administered encounter medications on file as of 01/21/2018.     ALLERGIES:  Allergies  Allergen Reactions  . Codeine   . Penicillins Other (See Comments)    Blisters in mouth after dental work Has patient had a PCN reaction causing immediate rash, facial/tongue/throat swelling, SOB or lightheadedness with hypotension: Yes Has patient had a PCN reaction causing severe rash involving mucus membranes or skin necrosis: No Has patient had a PCN reaction that required hospitalization No Has patient had a PCN reaction occurring within the last 10 years: Yes If all of the above answers are "NO", then may proceed with Cephalosporin use.      PHYSICAL EXAM:  ECOG Performance status: 0  Vitals:   01/21/18 0835  BP: 113/61  Pulse: (!) 117  Resp: 20  Temp: 98.3 F (36.8 C)  SpO2: 93%   Filed Weights   01/21/18 0835  Weight: 218 lb 1.6 oz (98.9 kg)    Physical Exam Deferred.  LABORATORY DATA:  I have reviewed the labs as listed.  CBC    Component Value Date/Time   WBC 6.6  10/31/2017 0549   RBC 4.29 10/31/2017 0549   HGB 10.7 (L) 10/31/2017 0549   HCT 37.1 10/31/2017 0549   PLT 251 10/31/2017 0549   MCV 86.5 10/31/2017 0549   MCH 24.9 (L) 10/31/2017 0549   MCHC 28.8 (L) 10/31/2017 0549   RDW 16.5 (H) 10/31/2017 0549   LYMPHSABS 0.7 10/30/2017 0522   MONOABS 0.6 10/30/2017 0522   EOSABS 0.0 10/30/2017 0522   BASOSABS 0.0 10/30/2017 0522   CMP Latest Ref Rng & Units 10/31/2017 10/30/2017 10/29/2017  Glucose 65 - 99 mg/dL 157(H) 178(H) 244(H)  BUN 6 - 20 mg/dL _0 Creatinine 0.44 - 1.00 mg/dL 0.66 0.72 0.79  Sodium 135 - 145 mmol/L 136 137 138  Potassium 3.5 - 5.1 mmol/L 3.5 3.5 4.0  Chloride 101 - 111 mmol/L 96(L) 98(L) 100(L)  CO2 22 - 32 mmol/L _1 Calcium 8.9 - 10.3 mg/dL 8.4(L) 8.5(L) 8.2(L)  Total Protein 6.5 - 8.1 g/dL - - -  Total Bilirubin 0.3 - 1.2 mg/dL - - -  Alkaline Phos 38 - 126 U/L - - -  AST 15 - 41 U/L - - -  ALT 14 - 54 U/L - - -       ASSESSMENT & PLAN:   Squamous cell lung cancer, left ([facility]) 1.  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  She was evaluated by Dr. Roxan Hockey on 01/08/2018 and felt not to be a surgical candidate. -We talked about combination chemoradiation therapy with carboplatin and paclitaxel delivered on a weekly basis for 5 to 6 weeks.  We will make a referral to Dr. Quitman Livings in [place] for radiation  therapy.  We talked about side effects of chemotherapy regimen in detail.  We have made a referral for port placement.  2.  Family history: The 2 of her sisters died of stomach cancer.  Father died of lung cancer.  Maternal aunt died of thyroid cancer.  Maternal grandfather had cancer in the bone.  Primary is unknown.  We will consider genetic testing.  Total time spent is 40 minutes with more than 50% of the time spent face-to-face discussing treatment plan, side effects and coordination of care.    Orders placed this encounter:  No orders of the defined types were placed in this encounter.     Derek Jack, MD [place] [phone]

## 2018-01-22 DIAGNOSIS — C3412 Malignant neoplasm of upper lobe, left bronchus or lung: Secondary | ICD-10-CM | POA: Insufficient documentation

## 2018-01-22 MED ORDER — LIDOCAINE-PRILOCAINE 2.5-2.5 % EX CREA: TOPICAL_CREAM | CUTANEOUS | 3 refills | Status: AC

## 2018-01-22 MED ORDER — PROCHLORPERAZINE MALEATE 10 MG PO TABS: 10.0000 mg | ORAL_TABLET | Freq: Four times a day (QID) | ORAL | 3 refills | Status: DC | PRN

## 2018-01-22 NOTE — Patient Instructions (Addendum)
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 The goal for your treatment is CURE. You will receive weekly Carboplatin and Paclitaxel (chemotherapy medications) along with radiation. You will see the doctor regularly throughout treatment.  We monitor your lab work prior to every treatment.  The doctor monitors your response to treatment by the way you are feeling, your blood work, and scans periodically.  During your treatment there will be wait times.  It is about 30 minutes to 1 hour wait for lab work to result.  Then there is a wait time for pharmacy to prepare your medications.    You will have the following premedications prior to receiving chemotherapy:  Premeds: Benadryl:  Help prevent an allergic-like reaction to the chemotherapy.  Pepcid: antihistamine - helps prevent an allergic-like reaction to the chemo. Aloxi - high powered nausea/vomiting prevention medication used for chemotherapy patients.  Dexamethasone - steroid - given to reduce the risk of you having an allergic type reaction to the chemotherapy. Dex can cause you to feel energized, nervous/anxious/jittery, make you have trouble sleeping, and/or make you feel hot/flushed in the face/neck and/or look pink/red in the face/neck. These side effects will pass as the Dex wears off. (takes 20 minutes to infuse)     POTENTIAL SIDE EFFECTS OF TREATMENT:  Carboplatin   About This Drug Carboplatin is a drug used to treat cancer. This drug is given in the vein (IV). This drug takes about 30 minutes to infuse.   Possible Side Effects (More Common) . Nausea and throwing up (vomiting). These symptoms may happen within a few hours after your treatment and may last up to 24 hours. Medicines are available to stop or lessen these side effects. . Bone marrow depression. This is a decrease in the number of white blood cells, red blood cells, and platelets. This may raise your  risk of infection, make you tired and weak (fatigue), and raise your risk of bleeding. . Soreness of the mouth and throat. You may have red areas, white patches, or sores that hurt. . This drug may affect how your kidneys work. Your kidney function will be checked as needed. . Electrolyte changes. Your blood will be checked for electrolyte changes as needed.  Side Effects (Less Common) . Hair loss. Some patients lose their hair on the scalp and body. You may notice your hair thinning seven to 14 days after getting this drug. . Effects on the nerves are called peripheral neuropathy. You may feel numbness, tingling, or pain in your hands and feet. It may be hard for you to button your clothes, open jars, or walk as usual. The effect on the nerves may get worse with more doses of the drug. These effects get better in some people after the drug is stopped but it does not get better in all people. . Loose bowel movements (diarrhea) that may last for several days . Decreased hearing or ringing in the ears . Changes in the way food and drinks taste . Changes in liver function. Your liver function will be checked as needed.  Allergic Reactions Serious allergic reactions including anaphylaxis are rare. While you are getting this drug in your vein (IV), tell your nurse right away if you have any of these symptoms of an allergic reaction: . Trouble catching your breath . Feeling like your tongue or throat are swelling . Feeling your heart beat quickly or in a not normal way (palpitations) .  Feeling dizzy or lightheaded . Flushing, itching, rash, and/or hives  Treating Side Effects . Drink 6-8 cups of fluids each day unless your doctor has told you to limit your fluid intake due to some other health problem. A cup is 8 ounces of fluid. If you throw up or have loose bowel movements, you should drink more fluids so that you do not become dehydrated (lack water in the body from losing too much fluid). .  Mouth care is very important. Your mouth care should consist of routine, gentle cleaning of your teeth or dentures and rinsing your mouth with a mixture of 1/2 teaspoon of salt in 8 ounces of water or  teaspoon of baking soda in 8 ounces of water. This should be done at least after each meal and at bedtime. . If you have mouth sores, avoid mouthwash that has alcohol. Avoid alcohol and smoking because they can bother your mouth and throat. . If you have numbness and tingling in your hands and feet, be careful when cooking, walking, and handling sharp objects and hot liquids. . Talk with your nurse about getting a wig before you lose your hair. Also, call the [facility] at 800-ACS-2345 to find out information about the "Look Good, Feel Better" program close to where you live. It is a free program where women getting chemotherapy can learn about wigs, turbans and scarves as well as makeup techniques and skin and nail care.  Food and Drug Interactions There are no known interactions of carboplatin with food. This drug may interact with other medicines. Tell your doctor and pharmacist about all the medicines and dietary supplements (vitamins, minerals, herbs and others) that you are taking at this time. The safety and use of dietary supplements and alternative diets are often not known. Using these might affect your cancer or interfere with your treatment. Until more is known, you should not use dietary supplements or alternative diets without your doctor's help.  When to Call the Doctor Call your doctor or nurse right away if you have any of these symptoms: . Fever of 100.5 F (38 C) or above; chills . Bleeding or bruising that is not normal . Wheezing or trouble breathing . Nausea that stops you from eating or drinking . Throwing up more than once a day . Rash or itching . Loose bowel movements (diarrhea) more than four times a day or diarrhea with weakness or feeling lightheaded . Call  your doctor or nurse as soon as possible if any of these symptoms happen: . Numbness, tingling, decreased feeling or weakness in fingers, toes, arms, or legs . Change in hearing, ringing in the ears . Blurred vision or other changes in eyesight . Decreased urine . Yellowing of skin or eyes  Problems and Reproductive Concerns Sexual problems and reproduction concerns may happen. In both men and women, this drug may affect your ability to have children. This cannot be determined before your treatment. Talk with your doctor or nurse if you plan to have children. Ask for information on sperm or egg banking. In men, this drug may interfere with your ability to make sperm, but it should not change your ability to have sexual relations. In women, menstrual bleeding may become irregular or stop while you are getting this drug. Do not assume that you cannot become pregnant if you do not have a menstrual period. Women may go through signs of menopause (change of life) like vaginal dryness or itching. Vaginal lubricants can be used  to lessen vaginal dryness, itching, and pain during sexual relations.   Paclitaxel (Taxol)  Paclitaxel is a drug used to treat cancer. It is given in the vein (IV).  This will take 3 hours to infuse.  The first time this drug is given it will take longer to infuse because it is increased slowly to monitor for reactions.  The nurse will be in the room with you for the first 15 minutes.   Possible Side Effects . Hair loss. Hair loss is often temporary, although with certain medicine, hair loss can sometimes be permanent. Hair loss may happen suddenly or gradually. If you lose hair, you may lose it from your head, face, armpits, pubic area, chest, and/or legs. You may also notice your hair getting thin. . Swelling of your legs, ankles and/or feet (edema) . Flushing . Nausea and throwing up (vomiting) . Loose bowel movements (diarrhea) . Bone marrow depression. This is a  decrease in the number of white blood cells, red blood cells, and platelets. This may raise your risk of infection, make you tired and weak (fatigue), and raise your risk of bleeding. . Effects on the nerves are called peripheral neuropathy. You may feel numbness, tingling, or pain in your hands and feet. It may be hard for you to button your clothes, open jars, or walk as usual. The effect on the nerves may get worse with more doses of the drug. These effects get better in some people after the drug is stopped but it does not get better in all people. . Changes in your liver function . Bone, joint and muscle pain . Abnormal EKG . Allergic reaction: Allergic reactions, including anaphylaxis are rare but may happen in some patients. Signs of allergic reaction to this drug may be swelling of the face, feeling like your tongue or throat are swelling, trouble breathing, rash, itching, fever, chills, feeling dizzy, and/or feeling that your heart is beating in a fast or not normal way. If this happens, do not take another dose of this drug. You should get urgent medical treatment. . Infection . Changes in your kidney function. Note: Each of the side effects above was reported in 20% or greater of patients treated with paclitaxel. Not all possible side effects are included above.  Warnings and Precautions . Severe bone marrow depression   Side Effects . To help with hair loss, wash with a mild shampoo and avoid washing your hair every day. . Avoid rubbing your scalp, instead, pat your hair or scalp dry . Avoid coloring your hair . Limit your use of hair spray, electric curlers, blow dryers, and curling irons. . To help with nausea and vomiting, eat small, frequent meals instead of three large meals a day. Choose foods and drinks that are at room temperature. Ask your nurse or doctor about other helpful tips and medicine that is available to help or stop lessen these symptoms. . If you get diarrhea,  eat low-fiber foods that are high in protein and calories and avoid foods that can irritate your digestive tracts or lead to cramping. Ask your nurse or doctor about medicine that can lessen or stop your diarrhea. . Mouth care is very important. Your mouth care should consist of routine, gentle cleaning of your teeth or dentures and rinsing your mouth with a mixture of 1/2 teaspoon of salt in 8 ounces of water or  teaspoon of baking soda in 8 ounces of water. This should be done at least after each meal  and at bedtime. . If you have mouth sores, avoid mouthwash that has alcohol. Also avoid alcohol and smoking because they can bother your mouth and throat. . Drink plenty of fluids (a minimum of eight glasses per day is recommended). . Take your temperature as your doctor or nurse tells you, and whenever you feel like you may have a fever. . Talk to your doctor or nurse about precautions you can take to avoid infections and bleeding. . Be careful when cooking, walking, and handling sharp objects and hot liquids.  Food and Drug Interactions . There are no known interactions of paclitaxel with food. . This drug may interact with other medicines. Tell your doctor and pharmacist about all the medicines and dietary supplements (vitamins, minerals, herbs and others) that you are taking at this time. . The safety and use of dietary supplements and alternative diets are often not known. Using these might affect your cancer or interfere with your treatment. Until more is known, you should not use dietary supplements or alternative diets without your cancer doctor's help.  When to Call the Doctor Call your doctor or nurse if you have any of the following symptoms and/or any new or unusual symptoms: . Fever of 100.5 F (38 C) or above . Chills . Redness, pain, warmth, or swelling at the IV site during the infusion . Signs of allergic reaction: swelling of the face, feeling like your tongue or throat are  swelling, trouble breathing, rash, itching, fever, chills, feeling dizzy, and/or feeling that your heart is beating in a fast or not normal way . Feeling that your heart is beating in a fast or not normal way (palpitations) . Weight gain of 5 pounds in one week (fluid retention) . Decreased urine or very dark urine . Signs of liver problems: dark urine, pale bowel movements, bad stomach pain, feeling very tired and weak, unusual itching, or yellowing of the eyes or skin . Heavy menstrual period that lasts longer than normal . Easy bruising or bleeding . Nausea that stops you from eating or drinking, and/or that is not relieved by prescribed medicines. . Loose bowel movements (diarrhea) more than 4 times a day or diarrhea with weakness or lightheadedness . Pain in your mouth or throat that makes it hard to eat or drink . Lasting loss of appetite or rapid weight loss of five pounds in a week . Signs of peripheral neuropathy: numbness, tingling, or decreased feeling in fingers or toes; trouble walking or changes in the way you walk; or feeling clumsy when buttoning clothes, opening jars, or other routine activities . Joint and muscle pain that is not relieved by prescribed medicines . Extreme fatigue that interferes with normal activities . While you are getting this drug, please tell your nurse right away if you have any pain, redness, or swelling at the site of the IV infusion. . If you think you are pregnant.    SELF CARE ACTIVITIES WHILE ON CHEMOTHERAPY:  Hydration Increase your fluid intake 48 hours prior to treatment and drink at least 8 to 12 cups (64 ounces) of water/decaff beverages per day after treatment. You can still have your cup of coffee or soda but these beverages do not count as part of your 8 to 12 cups that you need to drink daily. No alcohol intake.  Medications Continue taking your normal prescription medication as prescribed.  If you start any new herbal or new  supplements please let us know first to make sure it  is safe.  Mouth Care Have teeth cleaned professionally before starting treatment. Keep dentures and partial plates clean. Use soft toothbrush and do not use mouthwashes that contain alcohol. Biotene is a good mouthwash that is available at most pharmacies or may be ordered by calling [phone]. Use warm salt water gargles (1 teaspoon salt per 1 quart warm water) before and after meals and at bedtime. Or you may rinse with 2 tablespoons of three-percent hydrogen peroxide mixed in eight ounces of water. If you are still having problems with your mouth or sores in your mouth please call the clinic. If you need dental work, please let the doctor know before you go for your appointment so that we can coordinate the best possible time for you in regards to your chemo regimen. You need to also let your dentist know that you are actively taking chemo. We may need to do labs prior to your dental appointment.  Skin Care Always use sunscreen that has not expired and with SPF (Sun Protection Factor) of 50 or higher. Wear hats to protect your head from the sun. Remember to use sunscreen on your hands, ears, face, & feet.  Use good moisturizing lotions such as udder cream, eucerin, or even Vaseline. Some chemotherapies can cause dry skin, color changes in your skin and nails.     Avoid long, hot showers or baths.  Use gentle, fragrance-free soaps and laundry detergent.  Use moisturizers, preferably creams or ointments rather than lotions because the thicker consistency is better at preventing skin dehydration. Apply the cream or ointment within 15 minutes of showering. Reapply moisturizer at night, and moisturize your hands every time after you wash them.  Hair Loss (if your doctor says your hair will fall out)   If your doctor says that your hair is likely to fall out, decide before you begin chemo whether you want to wear a wig. You may want to  shop before treatment to match your hair color.  Hats, turbans, and scarves can also camouflage hair loss, although some people prefer to leave their heads uncovered. If you go bare-headed outdoors, be sure to use sunscreen on your scalp.  Cut your hair short. It eases the inconvenience of shedding lots of hair, but it also can reduce the emotional impact of watching your hair fall out.  Don't perm or color your hair during chemotherapy. Those chemical treatments are already damaging to hair and can enhance hair loss. Once your chemo treatments are done and your hair has grown back, it's OK to resume dyeing or perming hair. With chemotherapy, hair loss is almost always temporary. But when it grows back, it may be a different color or texture. In older adults who still had hair color before chemotherapy, the new growth may be completely gray.  Often, new hair is very fine and soft.  Infection Prevention Please wash your hands for at least 30 seconds using warm soapy water. Handwashing is the #1 way to prevent the spread of germs. Stay away from sick people or people who are getting over a cold. If you develop respiratory systems such as green/yellow mucus production or productive cough or persistent cough let us know and we will see if you need an antibiotic. It is a good idea to keep a pair of gloves on when going into grocery stores/[employer] to decrease your risk of coming into contact with germs on the carts, etc. Carry alcohol hand gel with you at all times and use it  frequently if out in public. If your temperature reaches 100.5 or higher please call the clinic and let us know.  If it is after hours or on the weekend please go to the ER if your temperature is over 100.5.  Please have your own personal thermometer at home to use.    Sex and bodily fluids If you are going to have sex, a condom must be used to protect the person that isn't taking chemotherapy. Chemo can decrease your libido (sex  drive). For a few days after chemotherapy, chemotherapy can be excreted through your bodily fluids.  When using the toilet please close the lid and flush the toilet twice.  Do this for a few day after you have had chemotherapy.   Effects of chemotherapy on your sex life Some changes are simple and won't last long. They won't affect your sex life permanently. Sometimes you may feel:  too tired  not strong enough to be very active  sick or sore   not in the mood  anxious or low Your anxiety might not seem related to sex. For example, you may be worried about the cancer and how your treatment is going. Or you may be worried about money, or about how you family are coping with your illness. These things can cause stress, which can affect your interest in sex. It's important to talk to your partner about how you feel. Remember - the changes to your sex life don't usually last long. There's usually no medical reason to stop having sex during chemo. The drugs won't have any long term physical effects on your performance or enjoyment of sex. Cancer can't be passed on to your partner during sex  Contraception It's important to use reliable contraception during treatment. Avoid getting pregnant while you or your partner are having chemotherapy. This isbecause the drugs may harm the baby. Sometimes chemotherapy drugs can leave a man or woman infertile.  This means you would not be able to have children in the future. You might want to talk to someone about permanent infertility. It can be very difficult to learn that you may no longer be able to have children. Some people find counselling helpful. There might be ways to preserve your fertility, although this is easier for men than for women. You may want to speak to a fertility expert. You can talk about sperm banking or harvesting your eggs. You can also ask about other fertility options, such as donor eggs. If you haveor have had breast cancer, your  doctor might advise you not to take the contraceptive pill. This is because the hormones in it might affect the cancer. It is not known for sure whether or not chemotherapy drugs can be passed on through semen or secretions from the vagina. Because of this some doctors advise people to use a barrier method if you have sex during treatment. This applies to vaginal, anal or oral sex. Generally, doctors advise a barrier method only for the time you are actually having the treatment and for about a week after your treatment. Advice like this can be worrying, but this does not mean that you have to avoid being intimate with your partner. You can still have close contact with your partner and continue to enjoy sex.     Animals If you have cats or birds we just ask that you not change the litter or change the cage.  Please have someone else do this for you while you are on chemotherapy.  Food Safety During and After Cancer Treatment Food safety is important for people both during and after cancer treatment. Cancer and cancer treatments, such as chemotherapy, radiation therapy, and stem cell/bone marrow transplantation, often weaken the immune system. This makes it harder for your body to protect itself from foodborne illness, also called food poisoning. Foodborne illness is caused by eating food that contains harmful bacteria, parasites, or viruses.  Foods to avoid Some foods have a higher risk of becoming tainted with bacteria. These include:  Unwashed fresh fruit and vegetables, especially leafy vegetables that can hide dirt and other contaminants  Raw sprouts, such as alfalfa sprouts  Raw or undercooked beef, especially ground beef, or other raw or undercooked meat and poultry  Fatty, fried, or spicy foods immediately before or after treatment.  These can sit heavy on your stomach and make you feel nauseous.  Raw or undercooked shellfish, such as oysters.  Sushi and sashimi, which  often contain raw fish.   Unpasteurized beverages, such as unpasteurized fruit juices, raw milk, raw yogurt, or cider  Undercooked eggs, such as soft boiled, over easy, and poached; raw, unpasteurized eggs; or foods made with raw egg, such as homemade raw cookie dough and homemade mayonnaise   Simple steps for food safety Shop smart.  Do not buy food stored or displayed in an unclean area.  Do not buy bruised or damaged fruits or vegetables.  Do not buy cans that have cracks, dents, or bulges.  Pick up foods that can spoil at the end of your shopping trip and store them in a cooler on the way home. Prepare and clean up foods carefully.  Rinse all fresh fruits and vegetables under running water, and dry them with a clean towel or paper towel.  Clean the top of cans before opening them.  After preparing food, wash your hands for 20 seconds with hot water and soap. Pay special attention to areas between fingers and under nails.  Clean your utensils and dishes with hot water and soap.  Disinfect your kitchen and cutting boards using 1 teaspoon of liquid, unscented bleach mixed into 1 quart of water.  Dispose of old food.  Eat canned and packaged food before its expiration date (the "use by" or "best before" date).  Consume refrigerated leftovers within 3 to 4 days. After that time, throw out the food. Even if the food does not smell or look spoiled, it still may be unsafe. Some bacteria, such as Listeria, can grow even on foods stored in the refrigerator if they are kept for too long. Take precautions when eating out.  At restaurants, avoid buffets and salad bars where food sits out for a long time and comes in contact with many people. Food can become contaminated when someone with a virus, often a norovirus, or another "bug" handles it.  Put any leftover food in a "to-go" container yourself, rather than having the server do it. And, refrigerate leftovers as soon as you get  home.  Choose restaurants that are clean and that are willing to prepare your food as you order it cooked.    MEDICATIONS:                                           Compazine/Prochlorperazine 10mg  tablet. Take 1 tablet every 6 hours as needed for nausea/vomiting. (this can make you sleepy)  EMLA cream.  Apply a quarter size amount to port site 1 hour prior to chemo. Do not rub in. Cover with plastic wrap.   Over-the-Counter Meds:  Colace 100mg  capsule. Take 2 capsules once a day for constipation. You may increase this dose to 2 tablets in the morning and 2 tablets at night for a total of 4 capsules a day. If this does not relieve your constipation, please call [facility] so we can call a medication in to your pharmacy to help your bowels move.    Imodium 2mg  capsule. Take 2 capsules after the 1st loose stool and then 1 capsule after every loose stool but do not exceed 8 capsules in a 24 hour period. Call the [facility] if loose stools continue. If diarrhea occurs @ bedtime, take 2 capsules @ bedtime. Then take 2 capsules every 4 hours until morning. Call [facility].    Diarrhea Sheet  If you are having loose stools/diarrhea, please purchase Imodium and begin taking as outlined:  At the first sign of loose stools you should begin taking Imodium. Take two capsules (4mg ) after 1st loose stool. Then take 1 capsule after each loose stool but do not exceed 8 capsules in a 24 hour period. During the night, take two capsules (4mg ) at bedtime and continue every 4 hours during the night until the morning. Call the [facility]. If it is the weekend, go to the ER.  Always call the [facility] if you are having loose stools/diarrhea that you can't get under control.  Loose stools/diarrhea leads to dehydration (loss of water) in your body.  We have other options of trying to get the loose stools/diarrhea to stopped but you must let us know!   Constipation  Sheet  Colace 100mg  capsule. Take 2 capsules once a day for constipation. You may increase this dose to 2 tablets in the morning and 2 tablets at night for a total of 4 capsules a day. If this does not relieve your constipation, please call [facility] so we can call a medication in to your pharmacy to help your bowels move.    Do not go more than 2 days without a bowel movement.  It is very important that you do not become constipated.  It will make you feel sick to your stomach (nausea) and can cause abdominal pain and vomiting.   Nausea Sheet   Compazine/Prochlorperazine 10mg  tablet. Take 1 tablet every 6 hours as needed for nausea/vomiting. (can make you sleepy)  Please call the [facility] and let us know the amount of nausea that you are experiencing.  If you begin to vomit, you need to call the [facility] and if it is the weekend and you have vomited more than one time and can't get it to stop - go to the Emergency Room.  Persistent nausea/vomiting can lead to dehydration (loss of fluid in your body) and will make you feel terrible.   Ice chips, sips of clear liquids, foods that are @ room temperature, crackers, and toast tend to be better tolerated.     SYMPTOMS TO REPORT AS SOON AS POSSIBLE AFTER TREATMENT:  FEVER GREATER THAN 100.5 F  CHILLS WITH OR WITHOUT FEVER  NAUSEA AND VOMITING THAT IS NOT CONTROLLED WITH YOUR NAUSEA MEDICATION  UNUSUAL SHORTNESS OF BREATH  UNUSUAL BRUISING OR BLEEDING  TENDERNESS IN MOUTH AND THROAT WITH OR WITHOUT PRESENCE OF ULCERS  URINARY PROBLEMS  BOWEL PROBLEMS  UNUSUAL RASH  Wear comfortable clothing and clothing appropriate for easy access to any Portacath or PICC line. Let us know if there is anything that we can do to make your therapy better!    What to do if you need assistance after hours or on the weekends: CALL [phone].  HOLD on the line, do not hang up.  You will hear multiple  messages but at the end you will be connected with a nurse triage line.  They will contact the doctor if necessary.  Most of the time they will be able to assist you.  Do not call the hospital operator.    I have been informed and understand all of the instructions given to me and have received a copy. I have been instructed to call the clinic [phone] or my family physician as soon as possible for continued medical care, if indicated. I do not have any more questions at this time but understand that I may call the [facility] or the Patient Navigator at [phone] during office hours should I have questions or need assistance in obtaining follow-up care.

## 2018-01-23 ENCOUNTER — Inpatient Hospital Stay (HOSPITAL_COMMUNITY): Payer: PPO

## 2018-01-23 DIAGNOSIS — E785 Hyperlipidemia, unspecified: Secondary | ICD-10-CM | POA: Diagnosis not present

## 2018-01-23 DIAGNOSIS — Z87891 Personal history of nicotine dependence: Secondary | ICD-10-CM | POA: Diagnosis not present

## 2018-01-23 DIAGNOSIS — Z79891 Long term (current) use of opiate analgesic: Secondary | ICD-10-CM | POA: Diagnosis not present

## 2018-01-23 DIAGNOSIS — G939 Disorder of brain, unspecified: Secondary | ICD-10-CM | POA: Diagnosis not present

## 2018-01-23 DIAGNOSIS — Z7982 Long term (current) use of aspirin: Secondary | ICD-10-CM | POA: Diagnosis not present

## 2018-01-23 DIAGNOSIS — J449 Chronic obstructive pulmonary disease, unspecified: Secondary | ICD-10-CM | POA: Diagnosis not present

## 2018-01-23 DIAGNOSIS — Z7951 Long term (current) use of inhaled steroids: Secondary | ICD-10-CM | POA: Diagnosis not present

## 2018-01-23 DIAGNOSIS — C3492 Malignant neoplasm of unspecified part of left bronchus or lung: Secondary | ICD-10-CM

## 2018-01-23 DIAGNOSIS — F419 Anxiety disorder, unspecified: Secondary | ICD-10-CM | POA: Diagnosis not present

## 2018-01-23 DIAGNOSIS — Z809 Family history of malignant neoplasm, unspecified: Secondary | ICD-10-CM | POA: Diagnosis not present

## 2018-01-23 DIAGNOSIS — K219 Gastro-esophageal reflux disease without esophagitis: Secondary | ICD-10-CM | POA: Diagnosis not present

## 2018-01-23 DIAGNOSIS — Z79899 Other long term (current) drug therapy: Secondary | ICD-10-CM | POA: Diagnosis not present

## 2018-01-23 DIAGNOSIS — M1990 Unspecified osteoarthritis, unspecified site: Secondary | ICD-10-CM | POA: Diagnosis not present

## 2018-01-23 DIAGNOSIS — Z833 Family history of diabetes mellitus: Secondary | ICD-10-CM | POA: Diagnosis not present

## 2018-01-23 DIAGNOSIS — I10 Essential (primary) hypertension: Secondary | ICD-10-CM | POA: Diagnosis not present

## 2018-01-23 DIAGNOSIS — C3412 Malignant neoplasm of upper lobe, left bronchus or lung: Secondary | ICD-10-CM | POA: Diagnosis not present

## 2018-01-24 ENCOUNTER — Other Ambulatory Visit (HOSPITAL_COMMUNITY): Payer: Self-pay | Admitting: Hematology

## 2018-01-24 NOTE — Progress Notes (Signed)
START ON PATHWAY REGIMEN - Non-Small Cell Lung     Administer weekly:     Paclitaxel      Carboplatin   **Always confirm dose/schedule in your pharmacy ordering system**  Patient Characteristics: Stage III - Unresectable, PS = 0, 1 AJCC T Category: T3 Current Disease Status: No Distant Mets or Local Recurrence AJCC N Category: N1 AJCC M Category: M0 AJCC 8 Stage Grouping: IIIA Performance Status: PS = 0, 1 Intent of Therapy: Curative Intent, Discussed with Patient

## 2018-01-28 ENCOUNTER — Other Ambulatory Visit: Payer: Self-pay

## 2018-01-28 ENCOUNTER — Ambulatory Visit (INDEPENDENT_AMBULATORY_CARE_PROVIDER_SITE_OTHER): Payer: PPO | Admitting: General Surgery

## 2018-01-28 ENCOUNTER — Encounter (HOSPITAL_COMMUNITY)
Admission: RE | Admit: 2018-01-28 | Discharge: 2018-01-28 | Disposition: A | Discharge status: Home / Self Care | Payer: PPO | Source: Ambulatory Visit | Attending: General Surgery | Admitting: General Surgery

## 2018-01-28 ENCOUNTER — Ambulatory Visit (HOSPITAL_COMMUNITY): Payer: Self-pay | Admitting: Hematology

## 2018-01-28 ENCOUNTER — Encounter: Payer: Self-pay | Admitting: General Surgery

## 2018-01-28 ENCOUNTER — Encounter (HOSPITAL_COMMUNITY): Payer: Self-pay

## 2018-01-28 VITALS — BP 187/97 | HR 119 | Temp 98.0°F | Ht 63.0 in | Wt 220.0 lb

## 2018-01-28 DIAGNOSIS — Z01812 Encounter for preprocedural laboratory examination: Secondary | ICD-10-CM | POA: Diagnosis not present

## 2018-01-28 DIAGNOSIS — C3492 Malignant neoplasm of unspecified part of left bronchus or lung: Secondary | ICD-10-CM

## 2018-01-28 HISTORY — DX: Bipolar disorder, unspecified: F31.9

## 2018-01-28 LAB — CBC WITH DIFFERENTIAL/PLATELET
BASOS ABS: 0 10*3/uL (ref 0.0–0.1)
Basophils Relative: 0 %
Eosinophils Absolute: 0 10*3/uL (ref 0.0–0.7)
Eosinophils Relative: 1 %
HCT: 38 % (ref 36.0–46.0)
Hemoglobin: 11 g/dL — ABNORMAL LOW (ref 12.0–15.0)
LYMPHS ABS: 2.9 10*3/uL (ref 0.7–4.0)
LYMPHS PCT: 41 %
MCH: 24.6 pg — AB (ref 26.0–34.0)
MCHC: 28.9 g/dL — ABNORMAL LOW (ref 30.0–36.0)
MCV: 84.8 fL (ref 78.0–100.0)
Monocytes Absolute: 0.8 10*3/uL (ref 0.1–1.0)
Monocytes Relative: 12 %
NEUTROS PCT: 46 %
Neutro Abs: 3.4 10*3/uL (ref 1.7–7.7)
PLATELETS: 426 10*3/uL — AB (ref 150–400)
RBC: 4.48 MIL/uL (ref 3.87–5.11)
RDW: 16.2 % — ABNORMAL HIGH (ref 11.5–15.5)
WBC: 7.2 10*3/uL (ref 4.0–10.5)

## 2018-01-28 LAB — BASIC METABOLIC PANEL
Anion gap: 9 (ref 5–15)
BUN: 26 mg/dL — AB (ref 6–20)
CO2: 27 mmol/L (ref 22–32)
Calcium: 9 mg/dL (ref 8.9–10.3)
Chloride: 102 mmol/L (ref 101–111)
Creatinine, Ser: 0.88 mg/dL (ref 0.44–1.00)
GFR calc Af Amer: >60 mL/min (ref >60)
Glucose, Bld: 98 mg/dL (ref 65–99)
POTASSIUM: 4.5 mmol/L (ref 3.5–5.1)
SODIUM: 138 mmol/L (ref 135–145)

## 2018-01-28 NOTE — Patient Instructions (Signed)
[facility]  01/28/2018     @PREFPERIOPPHARMACY @   Your procedure is scheduled on 02/03/18.  Report to [facility] at [id number].  Call this number if you have problems the morning of surgery:  [phone]   Remember:  Do not eat or drink after midnight.  Take these medicines the morning of surgery with A SIP OF WATER  xanax, cardizem, prozac, norco, lisinopril, prilosec, compazine, zanaflex.  Use your albuterol & symbicort inhaler & bring them with you.    Do not wear jewelry, make-up or nail polish.  Do not wear lotions, powders, or perfumes, or deodorant.  Do not shave 48 hours prior to surgery.  Men may shave face and neck.  Do not bring valuables to the hospital.  [facility] is not responsible for any belongings or valuables.  Contacts, dentures or bridgework may not be worn into surgery.  Leave your suitcase in the car.  After surgery it may be brought to your room.  For patients admitted to the hospital, discharge time will be determined by your treatment team.  Patients discharged the day of surgery will not be allowed to drive home.   Name and phone number of your driver:   family Special instructions:    Please read over the following fact sheets that you were given. Surgical Site Infection Prevention, Anesthesia Post-op Instructions and Care and Recovery After Surgery      Implanted [facility] Guide An implanted port is a type of central line that is placed under the skin. Central lines are used to provide IV access when treatment or nutrition needs to be given through a person's veins. Implanted ports are used for long-term IV access. An implanted port may be placed because:  You need IV medicine that would be irritating to the small veins in your hands or arms.  You need long-term IV medicines, such as antibiotics.  You need IV nutrition for a long period.  You need frequent blood draws for lab tests.  You need dialysis.  Implanted ports are usually  placed in the chest area, but they can also be placed in the upper arm, the abdomen, or the leg. An implanted port has two main parts:  Reservoir. The reservoir is round and will appear as a small, raised area under your skin. The reservoir is the part where a needle is inserted to give medicines or draw blood.  Catheter. The catheter is a thin, flexible tube that extends from the reservoir. The catheter is placed into a large vein. Medicine that is inserted into the reservoir goes into the catheter and then into the vein.  How will I care for my incision site? Do not get the incision site wet. Bathe or shower as directed by your health care provider. How is my port accessed? Special steps must be taken to access the port:  Before the port is accessed, a numbing cream can be placed on the skin. This helps numb the skin over the port site.  Your health care provider uses a sterile technique to access the port. ? Your health care provider must put on a mask and sterile gloves. ? The skin over your port is cleaned carefully with an antiseptic and allowed to dry. ? The port is gently pinched between sterile gloves, and a needle is inserted into the port.  Only "non-coring" port needles should be used to access the port. Once the port is accessed, a blood return should be checked. This  helps ensure that the port is in the vein and is not clogged.  If your port needs to remain accessed for a constant infusion, a clear (transparent) bandage will be placed over the needle site. The bandage and needle will need to be changed every week, or as directed by your health care provider.  Keep the bandage covering the needle clean and dry. Do not get it wet. Follow your health care provider's instructions on how to take a shower or bath while the port is accessed.  If your port does not need to stay accessed, no bandage is needed over the port.  What is flushing? Flushing helps keep the port from getting  clogged. Follow your health care provider's instructions on how and when to flush the port. Ports are usually flushed with saline solution or a medicine called heparin. The need for flushing will depend on how the port is used.  If the port is used for intermittent medicines or blood draws, the port will need to be flushed: ? After medicines have been given. ? After blood has been drawn. ? As part of routine maintenance.  If a constant infusion is running, the port may not need to be flushed.  How long will my port stay implanted? The port can stay in for as long as your health care provider thinks it is needed. When it is time for the port to come out, surgery will be done to remove it. The procedure is similar to the one performed when the port was put in. When should I seek immediate medical care? When you have an implanted port, you should seek immediate medical care if:  You notice a bad smell coming from the incision site.  You have swelling, redness, or drainage at the incision site.  You have more swelling or pain at the port site or the surrounding area.  You have a fever that is not controlled with medicine.  This information is not intended to replace advice given to you by your health care provider. Make sure you discuss any questions you have with your health care provider. Document Released: 07/30/2005 Document Revised: 01/05/2016 Document Reviewed: 04/06/2013  Elsevier Interactive Patient Education  2017 [employer] PATIENT INSTRUCTIONS POST-ANESTHESIA  IMMEDIATELY FOLLOWING SURGERY:  Do not drive or operate machinery for the first twenty four hours after surgery.  Do not make any important decisions for twenty four hours after surgery or while taking narcotic pain medications or sedatives.  If you develop intractable nausea and vomiting or a severe headache please notify your doctor immediately.  FOLLOW-UP:  Please make an appointment with your surgeon as instructed.  You do not need to follow up with anesthesia unless specifically instructed to do so.  WOUND CARE INSTRUCTIONS (if applicable):  Keep a dry clean dressing on the anesthesia/puncture wound site if there is drainage.  Once the wound has quit draining you may leave it open to air.  Generally you should leave the bandage intact for twenty four hours unless there is drainage.  If the epidural site drains for more than 36-48 hours please call the anesthesia department.  QUESTIONS?:  Please feel free to call your physician or the hospital operator if you have any questions, and they will be happy to assist you.

## 2018-01-28 NOTE — Patient Instructions (Signed)
Implanted Port Insertion Implanted port insertion is a procedure to put in a port and catheter. The port is a device with an injectable disk that can be accessed by your health care provider. The port is connected to a vein in the chest or neck by a small flexible tube (catheter). There are different types of ports. The implanted port may be used as a long-term IV access for:  Medicines, such as chemotherapy.  Fluids.  Liquid nutrition, such as total parenteral nutrition (TPN).  Blood samples.  Having a port means that your health care provider will not need to use the veins in your arms for these procedures. Tell a health care provider about:  Any allergies you have.  All medicines you are taking, especially blood thinners, as well as any vitamins, herbs, eye drops, creams, over-the-counter medicines, and steroids.  Any problems you or family members have had with anesthetic medicines.  Any blood disorders you have.  Any surgeries you have had.  Any medical conditions you have, including diabetes or kidney problems.  Whether you are pregnant or may be pregnant. What are the risks? Generally, this is a safe procedure. However, problems may occur, including:  Allergic reactions to medicines or dyes.  Damage to other structures or organs.  Infection.  Damage to the blood vessel, bruising, or bleeding at the puncture site.  Blood clot.  Breakdown of the skin over the port.  A collection of air in the chest that can cause one of the lungs to collapse (pneumothorax). This is rare.  What happens before the procedure? Staying hydrated Follow instructions from your health care provider about hydration, which may include:  Up to 2 hours before the procedure - you may continue to drink clear liquids, such as water, clear fruit juice, black coffee, and plain tea.  Eating and drinking restrictions  Follow instructions from your health care provider about eating and drinking,  which may include: ? 8 hours before the procedure - stop eating heavy meals or foods such as meat, fried foods, or fatty foods. ? 6 hours before the procedure - stop eating light meals or foods, such as toast or cereal. ? 6 hours before the procedure - stop drinking milk or drinks that contain milk. ? 2 hours before the procedure - stop drinking clear liquids. Medicines  Ask your health care provider about: ? Changing or stopping your regular medicines. This is especially important if you are taking diabetes medicines or blood thinners. ? Taking medicines such as aspirin and ibuprofen. These medicines can thin your blood. Do not take these medicines before your procedure if your health care provider instructs you not to.  You may be given antibiotic medicine to help prevent infection. General instructions  Plan to have someone take you home from the hospital or clinic.  If you will be going home right after the procedure, plan to have someone with you for 24 hours.  You may have blood tests.  You may be asked to shower with a germ-killing soap. What happens during the procedure?  To lower your risk of infection: ? Your health care team will wash or sanitize their hands. ? Your skin will be washed with soap. ? Hair may be removed from the surgical area.  An IV tube will be inserted into one of your veins.  You will be given one or more of the following: ? A medicine to help you relax (sedative). ? A medicine to numb the area (local anesthetic).    Two small cuts (incisions) will be made to insert the port. ? One incision will be made in your neck to get access to the vein where the catheter will lie. ? The other incision will be made in the upper chest. This is where the port will lie.  The procedure may be done using continuous X-ray (fluoroscopy) or other imaging tools for guidance.  The port and catheter will be placed. There may be a small, raised area where the port  is.  The port will be flushed with a salt solution (saline), and blood will be drawn to make sure that it is working correctly.  The incisions will be closed.  Bandages (dressings) may be placed over the incisions. The procedure may vary among health care providers and hospitals. What happens after the procedure?  Your blood pressure, heart rate, breathing rate, and blood oxygen level will be monitored until the medicines you were given have worn off.  Do not drive for 24 hours if you were given a sedative.  You will be given a manufacturer's information card for the type of port that you have. Keep this with you.  Your port will need to be flushed and checked as told by your health care provider, usually every few weeks.  A chest X-ray will be done to: ? Check the placement of the port. ? Make sure there is no injury to your lung. Summary  Implanted port insertion is a procedure to put in a port and catheter.  The implanted port is used as a long-term IV access.  The port will need to be flushed and checked as told by your health care provider, usually every few weeks.  Keep your manufacturer's information card with you at all times. This information is not intended to replace advice given to you by your health care provider. Make sure you discuss any questions you have with your health care provider. Document Released: 05/20/2013 Document Revised: 06/20/2016 Document Reviewed: 06/20/2016 Elsevier Interactive Patient Education  2017 [employer] Implanted Port Home Guide An implanted port is a type of central line that is placed under the skin. Central lines are used to provide IV access when treatment or nutrition needs to be given through a person's veins. Implanted ports are used for long-term IV access. An implanted port may be placed because:  You need IV medicine that would be irritating to the small veins in your hands or arms.  You need long-term IV medicines, such as  antibiotics.  You need IV nutrition for a long period.  You need frequent blood draws for lab tests.  You need dialysis.  Implanted ports are usually placed in the chest area, but they can also be placed in the upper arm, the abdomen, or the leg. An implanted port has two main parts:  Reservoir. The reservoir is round and will appear as a small, raised area under your skin. The reservoir is the part where a needle is inserted to give medicines or draw blood.  Catheter. The catheter is a thin, flexible tube that extends from the reservoir. The catheter is placed into a large vein. Medicine that is inserted into the reservoir goes into the catheter and then into the vein.  How will I care for my incision site? Do not get the incision site wet. Bathe or shower as directed by your health care provider. How is my port accessed? Special steps must be taken to access the port:  Before the port is accessed,   a numbing cream can be placed on the skin. This helps numb the skin over the port site.  Your health care provider uses a sterile technique to access the port. ? Your health care provider must put on a mask and sterile gloves. ? The skin over your port is cleaned carefully with an antiseptic and allowed to dry. ? The port is gently pinched between sterile gloves, and a needle is inserted into the port.  Only "non-coring" port needles should be used to access the port. Once the port is accessed, a blood return should be checked. This helps ensure that the port is in the vein and is not clogged.  If your port needs to remain accessed for a constant infusion, a clear (transparent) bandage will be placed over the needle site. The bandage and needle will need to be changed every week, or as directed by your health care provider.  Keep the bandage covering the needle clean and dry. Do not get it wet. Follow your health care provider's instructions on how to take a shower or bath while the port is  accessed.  If your port does not need to stay accessed, no bandage is needed over the port.  What is flushing? Flushing helps keep the port from getting clogged. Follow your health care provider's instructions on how and when to flush the port. Ports are usually flushed with saline solution or a medicine called heparin. The need for flushing will depend on how the port is used.  If the port is used for intermittent medicines or blood draws, the port will need to be flushed: ? After medicines have been given. ? After blood has been drawn. ? As part of routine maintenance.  If a constant infusion is running, the port may not need to be flushed.  How long will my port stay implanted? The port can stay in for as long as your health care provider thinks it is needed. When it is time for the port to come out, surgery will be done to remove it. The procedure is similar to the one performed when the port was put in. When should I seek immediate medical care? When you have an implanted port, you should seek immediate medical care if:  You notice a bad smell coming from the incision site.  You have swelling, redness, or drainage at the incision site.  You have more swelling or pain at the port site or the surrounding area.  You have a fever that is not controlled with medicine.  This information is not intended to replace advice given to you by your health care provider. Make sure you discuss any questions you have with your health care provider. Document Released: 07/30/2005 Document Revised: 01/05/2016 Document Reviewed: 04/06/2013 Elsevier Interactive Patient Education  2017 [employer].  

## 2018-01-28 NOTE — H&P (Signed)
[name]; [id number]; 1946-03-13   HPI Patient is a 72 year old black female who was referred to my care by Dr. Delton Coombes for Port-A-Cath placement.  She has left lung carcinoma and is about to undergo chemotherapy.  She denies any significant pain. Past Medical History:  Diagnosis Date  . Anxiety   . Arthritis   . Chronic back pain   . COPD (chronic obstructive pulmonary disease) ([facility])   . 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 GERD (gastroesophageal reflux disease) 2000  . Hyperlipidemia 1995  . Hypertension 1995  . Nicotine addiction   . Obesity     Past Surgical History:  Procedure Laterality Date  . BACK SURGERY  1999  . BRONCHIAL BRUSHINGS Left 10/30/2017   Procedure: BRONCHIAL BRUSHINGS;  Surgeon: [name], MD;  [facility]: [facility];  Service: Cardiopulmonary;  Laterality: Left;  . BRONCHIAL WASHINGS Left 10/30/2017   Procedure: BRONCHIAL WASHINGS;  Surgeon: [name], MD;  [facility]: [facility];  Service: Cardiopulmonary;  Laterality: Left;  . CHOLECYSTECTOMY  2001  . COLONOSCOPY N/A 02/02/2016   Procedure: COLONOSCOPY;  Surgeon: [name], MD;  Location: [facility];  Service: Endoscopy;  Laterality: N/A;  930  . FLEXIBLE BRONCHOSCOPY Bilateral 10/30/2017   Procedure: FLEXIBLE BRONCHOSCOPY;  Surgeon: [name], MD;  [facility]: [facility];  Service: Cardiopulmonary;  Laterality: Bilateral;  . INCISIONAL HERNIA REPAIR  August 17, 2008  . left inguinal hernia herniorrhapy  1980  . removal of thmoma  03/27/2010   Dr. Arlyce Dice   . SPINE SURGERY    . TONSILLECTOMY    . TOTAL ABDOMINAL HYSTERECTOMY W/ BILATERAL SALPINGOOPHORECTOMY  1984    Family History  Problem Relation Age of Onset  . Heart disease Mother        enlarged  . Diabetes Mother   . Hypertension Mother   . Cancer Father        throat  . Hypertension Father   . Coronary artery disease Father   . Diabetes Sister        x3  . Asthma  Sister   . Kidney disease Brother   . Stroke Brother     Current Outpatient Medications on File Prior to Visit  Medication Sig Dispense Refill  . albuterol (PROVENTIL HFA;VENTOLIN HFA) 108 (90 Base) MCG/ACT inhaler INHALE 2 PUFFS BY MOUTH EVERY 6 HOURS IF NEEDED 8.5 g 0  . albuterol (PROVENTIL) (2.5 MG/3ML) 0.083% nebulizer solution INHALE ONE VIAL VIA NEBULIZER EVERY 6 HOURS AS NEEDED. 375 mL 0  . ALPRAZolam (XANAX) 1 MG tablet Take 1 tablet (1 mg total) by mouth 2 (two) times daily. 60 tablet 5  . aspirin (ASPIRIN LOW DOSE) 81 MG EC tablet Take 81 mg by mouth daily.      . celecoxib (CELEBREX) 400 MG capsule TAKE ONE CAPSULE BY MOUTH ONCE DAILY 30 capsule 5  . diltiazem (CARDIZEM CD) 120 MG 24 hr capsule TAKE 1 TABLET ONCE DAILY 90 capsule 1  . FLUoxetine (PROZAC) 20 MG capsule TAKE 1 TABLET ONCE DAILY 90 capsule 1  . furosemide (LASIX) 20 MG tablet Take 1 tablet (20 mg total) by mouth 2 (two) times daily. 180 tablet 1  . guaiFENesin (MUCINEX) 600 MG 12 hr tablet Take 1 tablet (600 mg total) by mouth 2 (two) times daily. 30 tablet 0  . HYDROcodone-acetaminophen (NORCO/VICODIN) 5-325 MG tablet TAKE ONE TABLET THREE TIMES A DAY 90 tablet 0  .  lidocaine-prilocaine (EMLA) cream Apply a quarter size amount to port site 1 hour prior to chemo. Do not rub in. Cover with plastic wrap. 30 g 3  . lisinopril (PRINIVIL,ZESTRIL) 10 MG tablet Take 1 tablet (10 mg total) by mouth daily. 90 tablet 3  . omeprazole (PRILOSEC) 40 MG capsule TAKE 1 CAPSULE ONCE DAILY 90 capsule 1  . potassium chloride SA (K-DUR,KLOR-CON) 20 MEQ tablet Take 1 tablet (20 mEq total) by mouth daily. 90 tablet 1  . prochlorperazine (COMPAZINE) 10 MG tablet Take 1 tablet (10 mg total) by mouth every 6 (six) hours as needed for nausea or vomiting. 30 tablet 3  . rosuvastatin (CRESTOR) 20 MG tablet Take 1 tablet (20 mg total) by mouth daily. 30 tablet 5  . SYMBICORT 80-4.5 MCG/ACT inhaler INHALE 2 PUFFS BY MOUTH TWICE DAILY. RINSE MOUTH  AFTER USE 10.2 g 0  . tiZANidine (ZANAFLEX) 4 MG tablet Take 1 tablet (4 mg total) by mouth 3 (three) times daily. 270 tablet 1   No current facility-administered medications on file prior to visit.     Allergies  Allergen Reactions  . Bee Venom Anaphylaxis  . Codeine   . Penicillins Other (See Comments)    Blisters in mouth after dental work Has patient had a PCN reaction causing immediate rash, facial/tongue/throat swelling, SOB or lightheadedness with hypotension: Yes Has patient had a PCN reaction causing severe rash involving mucus membranes or skin necrosis: No Has patient had a PCN reaction that required hospitalization No Has patient had a PCN reaction occurring within the last 10 years: Yes If all of the above answers are "NO", then may proceed with Cephalosporin use.     Social History   Substance and Sexual Activity  Alcohol Use No  . Alcohol/week: 0.0 oz    Social History   Tobacco Use  Smoking Status Former Smoker  . Packs/day: 1.50  . Years: 54.00  . Pack years: 81.00  . Types: Cigarettes  . Last attempt to quit: 08/20/2017  . Years since quitting: 0.4  Smokeless Tobacco Never Used    Review of Systems  Constitutional: Negative.   HENT: Negative.   Eyes: Negative.   Respiratory: Positive for shortness of breath and wheezing.   Cardiovascular: Negative.   Gastrointestinal: Negative.   Genitourinary: Negative.   Musculoskeletal: Positive for back pain.  Skin: Negative.   Neurological: Negative.   Endo/Heme/Allergies: Negative.   Psychiatric/Behavioral: Negative.     Objective   Vitals:   01/28/18 0849  BP: (!) 187/97  Pulse: (!) 119  Temp: 98 F (36.7 C)    Physical Exam  Constitutional: She is oriented to person, place, and time. She appears well-developed and well-nourished.  HENT:  Head: Normocephalic and atraumatic.  Cardiovascular: Normal rate, regular rhythm and normal heart sounds. Exam reveals no gallop and no friction rub.  No  murmur heard. Pulmonary/Chest: Effort normal and breath sounds normal. No stridor. No respiratory distress. She has no wheezes. She has no rales.    Longitudinal right parasternal scar present.  Neurological: She is alert and oriented to person, place, and time.  Skin: Skin is warm and dry.  Vitals reviewed.  Dr. Tomie China notes reviewed Assessment  Left lung carcinoma, need for central venous access   Plan   Patient is scheduled for Port-A-Cath insertion on 02/03/2018.  The risks and benefits of the procedure including bleeding, infection, and pneumothorax were fully explained to the patient, who gave informed consent.  Due to her right chest surgery,  will attempt left-sided Port-A-Cath.

## 2018-01-28 NOTE — Progress Notes (Signed)
[name]; [id number]; 07/19/1946   HPI Patient is a 71-year-old black female who was referred to my care by Dr. Katragadda for Port-A-Cath placement.  She has left lung carcinoma and is about to undergo chemotherapy.  She denies any significant pain. Past Medical History:  Diagnosis Date  . Anxiety   . Arthritis   . Chronic back pain   . COPD (chronic obstructive pulmonary disease) ([facility])   . Fracture of left ankle Jan 06, 2010   hairline/ Dr. Kramer is treating   . GERD (gastroesophageal reflux disease) 2000  . Hyperlipidemia 1995  . Hypertension 1995  . Nicotine addiction   . Obesity     Past Surgical History:  Procedure Laterality Date  . BACK SURGERY  1999  . BRONCHIAL BRUSHINGS Left 10/30/2017   Procedure: BRONCHIAL BRUSHINGS;  Surgeon: Hawkins, Edward, MD;  [facility];  Service: Cardiopulmonary;  Laterality: Left;  . BRONCHIAL WASHINGS Left 10/30/2017   Procedure: BRONCHIAL WASHINGS;  Surgeon: Hawkins, Edward, MD;  [facility];  Service: Cardiopulmonary;  Laterality: Left;  . CHOLECYSTECTOMY  2001  . COLONOSCOPY N/A 02/02/2016   Procedure: COLONOSCOPY;  Surgeon: Najeeb U Rehman, MD;  [facility];  Service: Endoscopy;  Laterality: N/A;  930  . FLEXIBLE BRONCHOSCOPY Bilateral 10/30/2017   Procedure: FLEXIBLE BRONCHOSCOPY;  Surgeon: Hawkins, Edward, MD;  [facility];  Service: Cardiopulmonary;  Laterality: Bilateral;  . INCISIONAL HERNIA REPAIR  August 17, 2008  . left inguinal hernia herniorrhapy  1980  . removal of thmoma  03/27/2010   Dr. Burney   . SPINE SURGERY    . TONSILLECTOMY    . TOTAL ABDOMINAL HYSTERECTOMY W/ BILATERAL SALPINGOOPHORECTOMY  1984    Family History  Problem Relation Age of Onset  . Heart disease Mother        enlarged  . Diabetes Mother   . Hypertension Mother   . Cancer Father        throat  . Hypertension Father   . Coronary artery disease Father   . Diabetes Sister        x3  . Asthma  Sister   . Kidney disease Brother   . Stroke Brother     Current Outpatient Medications on File Prior to Visit  Medication Sig Dispense Refill  . albuterol (PROVENTIL HFA;VENTOLIN HFA) 108 (90 Base) MCG/ACT inhaler INHALE 2 PUFFS BY MOUTH EVERY 6 HOURS IF NEEDED 8.5 g 0  . albuterol (PROVENTIL) (2.5 MG/3ML) 0.083% nebulizer solution INHALE ONE VIAL VIA NEBULIZER EVERY 6 HOURS AS NEEDED. 375 mL 0  . ALPRAZolam (XANAX) 1 MG tablet Take 1 tablet (1 mg total) by mouth 2 (two) times daily. 60 tablet 5  . aspirin (ASPIRIN LOW DOSE) 81 MG EC tablet Take 81 mg by mouth daily.      . celecoxib (CELEBREX) 400 MG capsule TAKE ONE CAPSULE BY MOUTH ONCE DAILY 30 capsule 5  . diltiazem (CARDIZEM CD) 120 MG 24 hr capsule TAKE 1 TABLET ONCE DAILY 90 capsule 1  . FLUoxetine (PROZAC) 20 MG capsule TAKE 1 TABLET ONCE DAILY 90 capsule 1  . furosemide (LASIX) 20 MG tablet Take 1 tablet (20 mg total) by mouth 2 (two) times daily. 180 tablet 1  . guaiFENesin (MUCINEX) 600 MG 12 hr tablet Take 1 tablet (600 mg total) by mouth 2 (two) times daily. 30 tablet 0  . HYDROcodone-acetaminophen (NORCO/VICODIN) 5-325 MG tablet TAKE ONE TABLET THREE TIMES A DAY 90 tablet 0  .   lidocaine-prilocaine (EMLA) cream Apply a quarter size amount to port site 1 hour prior to chemo. Do not rub in. Cover with plastic wrap. 30 g 3  . lisinopril (PRINIVIL,ZESTRIL) 10 MG tablet Take 1 tablet (10 mg total) by mouth daily. 90 tablet 3  . omeprazole (PRILOSEC) 40 MG capsule TAKE 1 CAPSULE ONCE DAILY 90 capsule 1  . potassium chloride SA (K-DUR,KLOR-CON) 20 MEQ tablet Take 1 tablet (20 mEq total) by mouth daily. 90 tablet 1  . prochlorperazine (COMPAZINE) 10 MG tablet Take 1 tablet (10 mg total) by mouth every 6 (six) hours as needed for nausea or vomiting. 30 tablet 3  . rosuvastatin (CRESTOR) 20 MG tablet Take 1 tablet (20 mg total) by mouth daily. 30 tablet 5  . SYMBICORT 80-4.5 MCG/ACT inhaler INHALE 2 PUFFS BY MOUTH TWICE DAILY. RINSE MOUTH  AFTER USE 10.2 g 0  . tiZANidine (ZANAFLEX) 4 MG tablet Take 1 tablet (4 mg total) by mouth 3 (three) times daily. 270 tablet 1   No current facility-administered medications on file prior to visit.     Allergies  Allergen Reactions  . Bee Venom Anaphylaxis  . Codeine   . Penicillins Other (See Comments)    Blisters in mouth after dental work Has patient had a PCN reaction causing immediate rash, facial/tongue/throat swelling, SOB or lightheadedness with hypotension: Yes Has patient had a PCN reaction causing severe rash involving mucus membranes or skin necrosis: No Has patient had a PCN reaction that required hospitalization No Has patient had a PCN reaction occurring within the last 10 years: Yes If all of the above answers are "NO", then may proceed with Cephalosporin use.     Social History   Substance and Sexual Activity  Alcohol Use No  . Alcohol/week: 0.0 oz    Social History   Tobacco Use  Smoking Status Former Smoker  . Packs/day: 1.50  . Years: 54.00  . Pack years: 81.00  . Types: Cigarettes  . Last attempt to quit: 08/20/2017  . Years since quitting: 0.4  Smokeless Tobacco Never Used    Review of Systems  Constitutional: Negative.   HENT: Negative.   Eyes: Negative.   Respiratory: Positive for shortness of breath and wheezing.   Cardiovascular: Negative.   Gastrointestinal: Negative.   Genitourinary: Negative.   Musculoskeletal: Positive for back pain.  Skin: Negative.   Neurological: Negative.   Endo/Heme/Allergies: Negative.   Psychiatric/Behavioral: Negative.     Objective   Vitals:   01/28/18 0849  BP: (!) 187/97  Pulse: (!) 119  Temp: 98 F (36.7 C)    Physical Exam  Constitutional: She is oriented to person, place, and time. She appears well-developed and well-nourished.  HENT:  Head: Normocephalic and atraumatic.  Cardiovascular: Normal rate, regular rhythm and normal heart sounds. Exam reveals no gallop and no friction rub.  No  murmur heard. Pulmonary/Chest: Effort normal and breath sounds normal. No stridor. No respiratory distress. She has no wheezes. She has no rales.    Longitudinal right parasternal scar present.  Neurological: She is alert and oriented to person, place, and time.  Skin: Skin is warm and dry.  Vitals reviewed.  Dr. Katragadda's notes reviewed Assessment  Left lung carcinoma, need for central venous access   Plan   Patient is scheduled for Port-A-Cath insertion on 02/03/2018.  The risks and benefits of the procedure including bleeding, infection, and pneumothorax were fully explained to the patient, who gave informed consent.  Due to her right chest surgery,   will attempt left-sided Port-A-Cath.

## 2018-01-31 DIAGNOSIS — C3412 Malignant neoplasm of upper lobe, left bronchus or lung: Secondary | ICD-10-CM | POA: Diagnosis not present

## 2018-02-03 ENCOUNTER — Ambulatory Visit (HOSPITAL_COMMUNITY): Payer: PPO

## 2018-02-03 ENCOUNTER — Encounter (HOSPITAL_COMMUNITY): Payer: Self-pay | Admitting: *Deleted

## 2018-02-03 ENCOUNTER — Ambulatory Visit (HOSPITAL_COMMUNITY)
Admission: RE | Admit: 2018-02-03 | Discharge: 2018-02-03 | Disposition: A | Discharge status: Home / Self Care | Payer: PPO | Source: Ambulatory Visit | Attending: General Surgery | Admitting: General Surgery

## 2018-02-03 ENCOUNTER — Other Ambulatory Visit: Payer: Self-pay | Admitting: Family Medicine

## 2018-02-03 ENCOUNTER — Encounter (HOSPITAL_COMMUNITY)
Admission: RE | Disposition: A | Discharge status: Home / Self Care | Payer: Self-pay | Source: Ambulatory Visit | Attending: General Surgery

## 2018-02-03 ENCOUNTER — Ambulatory Visit (HOSPITAL_COMMUNITY): Payer: PPO | Admitting: Anesthesiology

## 2018-02-03 DIAGNOSIS — K219 Gastro-esophageal reflux disease without esophagitis: Secondary | ICD-10-CM | POA: Diagnosis not present

## 2018-02-03 DIAGNOSIS — C3492 Malignant neoplasm of unspecified part of left bronchus or lung: Secondary | ICD-10-CM | POA: Diagnosis not present

## 2018-02-03 DIAGNOSIS — Z885 Allergy status to narcotic agent status: Secondary | ICD-10-CM | POA: Diagnosis not present

## 2018-02-03 DIAGNOSIS — F319 Bipolar disorder, unspecified: Secondary | ICD-10-CM | POA: Diagnosis not present

## 2018-02-03 DIAGNOSIS — Z9049 Acquired absence of other specified parts of digestive tract: Secondary | ICD-10-CM | POA: Insufficient documentation

## 2018-02-03 DIAGNOSIS — F419 Anxiety disorder, unspecified: Secondary | ICD-10-CM | POA: Diagnosis not present

## 2018-02-03 DIAGNOSIS — Z833 Family history of diabetes mellitus: Secondary | ICD-10-CM | POA: Diagnosis not present

## 2018-02-03 DIAGNOSIS — Z79899 Other long term (current) drug therapy: Secondary | ICD-10-CM | POA: Insufficient documentation

## 2018-02-03 DIAGNOSIS — Z825 Family history of asthma and other chronic lower respiratory diseases: Secondary | ICD-10-CM | POA: Diagnosis not present

## 2018-02-03 DIAGNOSIS — Z8249 Family history of ischemic heart disease and other diseases of the circulatory system: Secondary | ICD-10-CM | POA: Insufficient documentation

## 2018-02-03 DIAGNOSIS — Z91030 Bee allergy status: Secondary | ICD-10-CM | POA: Insufficient documentation

## 2018-02-03 DIAGNOSIS — Z880 Allergy status to penicillin: Secondary | ICD-10-CM | POA: Insufficient documentation

## 2018-02-03 DIAGNOSIS — G8929 Other chronic pain: Secondary | ICD-10-CM | POA: Diagnosis not present

## 2018-02-03 DIAGNOSIS — E669 Obesity, unspecified: Secondary | ICD-10-CM | POA: Insufficient documentation

## 2018-02-03 DIAGNOSIS — Z7982 Long term (current) use of aspirin: Secondary | ICD-10-CM | POA: Diagnosis not present

## 2018-02-03 DIAGNOSIS — Z95828 Presence of other vascular implants and grafts: Secondary | ICD-10-CM

## 2018-02-03 DIAGNOSIS — C3412 Malignant neoplasm of upper lobe, left bronchus or lung: Secondary | ICD-10-CM | POA: Diagnosis not present

## 2018-02-03 DIAGNOSIS — I10 Essential (primary) hypertension: Secondary | ICD-10-CM | POA: Insufficient documentation

## 2018-02-03 DIAGNOSIS — Z823 Family history of stroke: Secondary | ICD-10-CM | POA: Insufficient documentation

## 2018-02-03 DIAGNOSIS — D649 Anemia, unspecified: Secondary | ICD-10-CM | POA: Insufficient documentation

## 2018-02-03 DIAGNOSIS — Z841 Family history of disorders of kidney and ureter: Secondary | ICD-10-CM | POA: Insufficient documentation

## 2018-02-03 DIAGNOSIS — E785 Hyperlipidemia, unspecified: Secondary | ICD-10-CM | POA: Diagnosis not present

## 2018-02-03 DIAGNOSIS — Z87891 Personal history of nicotine dependence: Secondary | ICD-10-CM | POA: Diagnosis not present

## 2018-02-03 DIAGNOSIS — Z7951 Long term (current) use of inhaled steroids: Secondary | ICD-10-CM | POA: Insufficient documentation

## 2018-02-03 DIAGNOSIS — Z4682 Encounter for fitting and adjustment of non-vascular catheter: Secondary | ICD-10-CM | POA: Diagnosis not present

## 2018-02-03 DIAGNOSIS — J449 Chronic obstructive pulmonary disease, unspecified: Secondary | ICD-10-CM | POA: Insufficient documentation

## 2018-02-03 DIAGNOSIS — Z800 Family history of malignant neoplasm of digestive organs: Secondary | ICD-10-CM | POA: Diagnosis not present

## 2018-02-03 DIAGNOSIS — M549 Dorsalgia, unspecified: Secondary | ICD-10-CM | POA: Diagnosis not present

## 2018-02-03 DIAGNOSIS — Z90710 Acquired absence of both cervix and uterus: Secondary | ICD-10-CM | POA: Diagnosis not present

## 2018-02-03 HISTORY — PX: PORTACATH PLACEMENT: SHX2246

## 2018-02-03 SURGERY — INSERTION, TUNNELED CENTRAL VENOUS DEVICE, WITH PORT
Anesthesia: Monitor Anesthesia Care | Site: Chest | Laterality: Left

## 2018-02-03 MED ORDER — HEPARIN SOD (PORK) LOCK FLUSH 100 UNIT/ML IV SOLN
INTRAVENOUS | Status: DC | PRN
  Administered 2018-02-03: 500 [IU] via INTRAVENOUS

## 2018-02-03 MED ORDER — PROPOFOL 500 MG/50ML IV EMUL
INTRAVENOUS | Status: DC | PRN
  Administered 2018-02-03: 75 ug/kg/min via INTRAVENOUS

## 2018-02-03 MED ORDER — LIDOCAINE HCL (PF) 1 % IJ SOLN
INTRAMUSCULAR | Status: AC
  Filled 2018-02-03: qty 30

## 2018-02-03 MED ORDER — CHLORHEXIDINE GLUCONATE CLOTH 2 % EX PADS: 6.0000 | MEDICATED_PAD | Freq: Once | CUTANEOUS | Status: DC

## 2018-02-03 MED ORDER — KETOROLAC TROMETHAMINE 30 MG/ML IJ SOLN
INTRAMUSCULAR | Status: AC
  Filled 2018-02-03: qty 1

## 2018-02-03 MED ORDER — SODIUM CHLORIDE 0.9 % IV SOLN
INTRAVENOUS | Status: AC | PRN
  Administered 2018-02-03: 500 mL via INTRAMUSCULAR

## 2018-02-03 MED ORDER — VANCOMYCIN HCL IN DEXTROSE 1-5 GM/200ML-% IV SOLN
INTRAVENOUS | Status: AC
  Filled 2018-02-03: qty 200

## 2018-02-03 MED ORDER — KETOROLAC TROMETHAMINE 30 MG/ML IJ SOLN
30.0000 mg | Freq: Once | INTRAMUSCULAR | Status: AC
  Administered 2018-02-03: 30 mg via INTRAVENOUS

## 2018-02-03 MED ORDER — FENTANYL CITRATE (PF) 100 MCG/2ML IJ SOLN
INTRAMUSCULAR | Status: AC
  Filled 2018-02-03: qty 2

## 2018-02-03 MED ORDER — PROPOFOL 10 MG/ML IV BOLUS
INTRAVENOUS | Status: AC
  Filled 2018-02-03: qty 20

## 2018-02-03 MED ORDER — VANCOMYCIN HCL IN DEXTROSE 1-5 GM/200ML-% IV SOLN
1000.0000 mg | INTRAVENOUS | Status: AC
  Administered 2018-02-03: 1000 mg via INTRAVENOUS

## 2018-02-03 MED ORDER — LACTATED RINGERS IV SOLN: INTRAVENOUS | Status: DC

## 2018-02-03 MED ORDER — HEPARIN SOD (PORK) LOCK FLUSH 100 UNIT/ML IV SOLN
INTRAVENOUS | Status: AC
  Filled 2018-02-03: qty 5

## 2018-02-03 MED ORDER — MIDAZOLAM HCL 2 MG/2ML IJ SOLN
INTRAMUSCULAR | Status: AC
  Filled 2018-02-03: qty 2

## 2018-02-03 MED ORDER — ONDANSETRON HCL 4 MG/2ML IJ SOLN
INTRAMUSCULAR | Status: AC
Start: 2018-02-03 — End: ?
  Filled 2018-02-03: qty 8

## 2018-02-03 MED ORDER — MIDAZOLAM HCL 5 MG/5ML IJ SOLN
INTRAMUSCULAR | Status: DC | PRN
  Administered 2018-02-03: 2 mg via INTRAVENOUS

## 2018-02-03 MED ORDER — LIDOCAINE HCL (PF) 1 % IJ SOLN
INTRAMUSCULAR | Status: DC | PRN
  Administered 2018-02-03: 10 mL

## 2018-02-03 MED ORDER — FENTANYL CITRATE (PF) 100 MCG/2ML IJ SOLN
INTRAMUSCULAR | Status: DC | PRN
  Administered 2018-02-03: 25 ug via INTRAVENOUS

## 2018-02-03 MED ORDER — LACTATED RINGERS IV SOLN
INTRAVENOUS | Status: DC | PRN
  Administered 2018-02-03: 07:00:00 via INTRAVENOUS

## 2018-02-03 SURGICAL SUPPLY — 31 items
ADH SKN CLS APL DERMABOND .7 (GAUZE/BANDAGES/DRESSINGS) ×1
BAG DECANTER FOR FLEXI CONT (MISCELLANEOUS) ×3 IMPLANT
CHLORAPREP W/TINT 10.5 ML (MISCELLANEOUS) ×3 IMPLANT
CLOTH BEACON ORANGE TIMEOUT ST (SAFETY) ×3 IMPLANT
COVER LIGHT HANDLE STERIS (MISCELLANEOUS) ×6 IMPLANT
DECANTER SPIKE VIAL GLASS SM (MISCELLANEOUS) ×3 IMPLANT
DERMABOND ADVANCED (GAUZE/BANDAGES/DRESSINGS) ×2
DERMABOND ADVANCED .7 DNX12 (GAUZE/BANDAGES/DRESSINGS) ×1 IMPLANT
DRAPE C-ARM FOLDED MOBILE STRL (DRAPES) ×3 IMPLANT
ELECT REM PT RETURN 9FT ADLT (ELECTROSURGICAL) ×3
ELECTRODE REM PT RTRN 9FT ADLT (ELECTROSURGICAL) ×1 IMPLANT
GLOVE BIOGEL PI IND STRL 7.0 (GLOVE) ×2 IMPLANT
GLOVE BIOGEL PI INDICATOR 7.0 (GLOVE) ×4
GLOVE ECLIPSE 6.5 STRL STRAW (GLOVE) ×2 IMPLANT
GLOVE SURG SS PI 7.5 STRL IVOR (GLOVE) ×3 IMPLANT
GOWN STRL REUS W/TWL LRG LVL3 (GOWN DISPOSABLE) ×6 IMPLANT
IV NS 500ML (IV SOLUTION) ×3
IV NS 500ML BAXH (IV SOLUTION) ×1 IMPLANT
KIT PORT POWER 8FR ISP MRI (Port) ×3 IMPLANT
KIT TURNOVER KIT A (KITS) ×3 IMPLANT
NDL HYPO 25X1 1.5 SAFETY (NEEDLE) ×1 IMPLANT
NEEDLE HYPO 25X1 1.5 SAFETY (NEEDLE) ×3 IMPLANT
PACK MINOR (CUSTOM PROCEDURE TRAY) ×3 IMPLANT
PAD ARMBOARD 7.5X6 YLW CONV (MISCELLANEOUS) ×3 IMPLANT
SET BASIN LINEN APH (SET/KITS/TRAYS/PACK) ×3 IMPLANT
SUT MNCRL AB 4-0 PS2 18 (SUTURE) ×3 IMPLANT
SUT VIC AB 3-0 SH 27 (SUTURE) ×3
SUT VIC AB 3-0 SH 27X BRD (SUTURE) ×1 IMPLANT
SYR 20CC LL (SYRINGE) ×3 IMPLANT
SYR 5ML LL (SYRINGE) ×3 IMPLANT
SYR CONTROL 10ML LL (SYRINGE) ×3 IMPLANT

## 2018-02-03 NOTE — Anesthesia Postprocedure Evaluation (Signed)
Anesthesia Post Note  Patient: [name]  Procedure(s) Performed: INSERTION PORT-A-CATH (Left Chest)  Patient location during evaluation: PACU Anesthesia Type: MAC Level of consciousness: awake and patient cooperative Pain management: pain level controlled Vital Signs Assessment: post-procedure vital signs reviewed and stable Respiratory status: spontaneous breathing, nonlabored ventilation and respiratory function stable Cardiovascular status: blood pressure returned to baseline Postop Assessment: no apparent nausea or vomiting Anesthetic complications: no     Last Vitals:  Vitals:   02/03/18 0645 02/03/18 0811  BP: 123/66 105/65  Pulse:  93  Resp: 20 19  Temp: 36.6 C 36.6 C  SpO2: 92% 96%    Last Pain:  Vitals:   02/03/18 0811  TempSrc:   PainSc: 0-No pain                 [name] J

## 2018-02-03 NOTE — Discharge Instructions (Signed)
Implanted Port Home Guide °An implanted port is a type of central line that is placed under the skin. Central lines are used to provide IV access when treatment or nutrition needs to be given through a person’s veins. Implanted ports are used for long-term IV access. An implanted port may be placed because: °· You need IV medicine that would be irritating to the small veins in your hands or arms. °· You need long-term IV medicines, such as antibiotics. °· You need IV nutrition for a long period. °· You need frequent blood draws for lab tests. °· You need dialysis. ° °Implanted ports are usually placed in the chest area, but they can also be placed in the upper arm, the abdomen, or the leg. An implanted port has two main parts: °· Reservoir. The reservoir is round and will appear as a small, raised area under your skin. The reservoir is the part where a needle is inserted to give medicines or draw blood. °· Catheter. The catheter is a thin, flexible tube that extends from the reservoir. The catheter is placed into a large vein. Medicine that is inserted into the reservoir goes into the catheter and then into the vein. ° °How will I care for my incision site? °Do not get the incision site wet. Bathe or shower as directed by your health care provider. °How is my port accessed? °Special steps must be taken to access the port: °· Before the port is accessed, a numbing cream can be placed on the skin. This helps numb the skin over the port site. °· Your health care provider uses a sterile technique to access the port. °? Your health care provider must put on a mask and sterile gloves. °? The skin over your port is cleaned carefully with an antiseptic and allowed to dry. °? The port is gently pinched between sterile gloves, and a needle is inserted into the port. °· Only "non-coring" port needles should be used to access the port. Once the port is accessed, a blood return should be checked. This helps ensure that the port  is in the vein and is not clogged. °· If your port needs to remain accessed for a constant infusion, a clear (transparent) bandage will be placed over the needle site. The bandage and needle will need to be changed every week, or as directed by your health care provider. °· Keep the bandage covering the needle clean and dry. Do not get it wet. Follow your health care provider’s instructions on how to take a shower or bath while the port is accessed. °· If your port does not need to stay accessed, no bandage is needed over the port. ° °What is flushing? °Flushing helps keep the port from getting clogged. Follow your health care provider’s instructions on how and when to flush the port. Ports are usually flushed with saline solution or a medicine called heparin. The need for flushing will depend on how the port is used. °· If the port is used for intermittent medicines or blood draws, the port will need to be flushed: °? After medicines have been given. °? After blood has been drawn. °? As part of routine maintenance. °· If a constant infusion is running, the port may not need to be flushed. ° °How long will my port stay implanted? °The port can stay in for as long as your health care provider thinks it is needed. When it is time for the port to come out, surgery will be   done to remove it. The procedure is similar to the one performed when the port was put in. °When should I seek immediate medical care? °When you have an implanted port, you should seek immediate medical care if: °· You notice a bad smell coming from the incision site. °· You have swelling, redness, or drainage at the incision site. °· You have more swelling or pain at the port site or the surrounding area. °· You have a fever that is not controlled with medicine. ° °This information is not intended to replace advice given to you by your health care provider. Make sure you discuss any questions you have with your health care provider. °Document  Released: 07/30/2005 Document Revised: 01/05/2016 Document Reviewed: 04/06/2013 °Elsevier Interactive Patient Education © 2017 [employer] °Implanted Port Insertion, Care After °This sheet gives you information about how to care for yourself after your procedure. Your health care provider may also give you more specific instructions. If you have problems or questions, contact your health care provider. °What can I expect after the procedure? °After your procedure, it is common to have: °· Discomfort at the port insertion site. °· Bruising on the skin over the port. This should improve over 3-4 days. ° °Follow these instructions at home: °Port care °· After your port is placed, you will get a manufacturer's information card. The card has information about your port. Keep this card with you at all times. °· Take care of the port as told by your health care provider. Ask your health care provider if you or a family member can get training for taking care of the port at home. A home health care nurse may also take care of the port. °· Make sure to remember what type of port you have. °Incision care °· Follow instructions from your health care provider about how to take care of your port insertion site. Make sure you: °? Wash your hands with soap and water before you change your bandage (dressing). If soap and water are not available, use hand sanitizer. °? Change your dressing as told by your health care provider. °? Leave stitches (sutures), skin glue, or adhesive strips in place. These skin closures may need to stay in place for 2 weeks or longer. If adhesive strip edges start to loosen and curl up, you may trim the loose edges. Do not remove adhesive strips completely unless your health care provider tells you to do that. °· Check your port insertion site every day for signs of infection. Check for: °? More redness, swelling, or pain. °? More fluid or blood. °? Warmth. °? Pus or a bad smell. °General  instructions °· Do not take baths, swim, or use a hot tub until your health care provider approves. °· Do not lift anything that is heavier than 10 lb (4.5 kg) for a week, or as told by your health care provider. °· Ask your health care provider when it is okay to: °? Return to work or school. °? Resume usual physical activities or sports. °· Do not drive for 24 hours if you were given a medicine to help you relax (sedative). °· Take over-the-counter and prescription medicines only as told by your health care provider. °· Wear a medical alert bracelet in case of an emergency. This will tell any health care providers that you have a port. °· Keep all follow-up visits as told by your health care provider. This is important. °Contact a health care provider if: °· You cannot   flush your port with saline as directed, or you cannot draw blood from the port. °· You have a fever or chills. °· You have more redness, swelling, or pain around your port insertion site. °· You have more fluid or blood coming from your port insertion site. °· Your port insertion site feels warm to the touch. °· You have pus or a bad smell coming from the port insertion site. °Get help right away if: °· You have chest pain or shortness of breath. °· You have bleeding from your port that you cannot control. °Summary °· Take care of the port as told by your health care provider. °· Change your dressing as told by your health care provider. °· Keep all follow-up visits as told by your health care provider. °This information is not intended to replace advice given to you by your health care provider. Make sure you discuss any questions you have with your health care provider. °Document Released: 05/20/2013 Document Revised: 06/20/2016 Document Reviewed: 06/20/2016 °Elsevier Interactive Patient Education © 2017 [employer] ° °

## 2018-02-03 NOTE — Interval H&P Note (Signed)
History and Physical Interval Note:  02/03/2018 7:02 AM  Diablo  has presented today for surgery, with the diagnosis of left lung cancer  The various methods of treatment have been discussed with the patient and family. After consideration of risks, benefits and other options for treatment, the patient has consented to  Procedure(s): INSERTION PORT-A-CATH (Left) as a surgical intervention .  The patient's history has been reviewed, patient examined, no change in status, stable for surgery.  I have reviewed the patient's chart and labs.  Questions were answered to the patient's satisfaction.     [name]

## 2018-02-03 NOTE — Transfer of Care (Signed)
Immediate Anesthesia Transfer of Care Note  Patient: [name]  Procedure(s) Performed: INSERTION PORT-A-CATH (Left Chest)  Patient Location: PACU  Anesthesia Type:MAC  Level of Consciousness: awake and patient cooperative  Airway & Oxygen Therapy: Patient Spontanous Breathing and Patient connected to nasal cannula oxygen  Post-op Assessment: Report given to RN, Post -op Vital signs reviewed and stable and Patient moving all extremities  Post vital signs: Reviewed and stable  Last Vitals:  Vitals Value Taken Time  BP    Temp    Pulse    Resp    SpO2      Last Pain:  Vitals:   02/03/18 0645  TempSrc: Oral  PainSc: 0-No pain      Patients Stated Pain Goal: 8 (51/10/21 1173)  Complications: No apparent anesthesia complications

## 2018-02-03 NOTE — Anesthesia Preprocedure Evaluation (Signed)
Anesthesia Evaluation  Patient identified by MRN, date of birth, ID band Patient awake    Reviewed: Allergy & Precautions, H&P , NPO status , Patient's Chart, lab work & pertinent test results  Airway Mallampati: II  TM Distance: >3 FB Neck ROM: full    Dental no notable dental hx.    Pulmonary neg pulmonary ROS, COPD, former smoker,    Pulmonary exam normal breath sounds clear to auscultation       Cardiovascular Exercise Tolerance: Good hypertension, negative cardio ROS   Rhythm:regular Rate:Normal     Neuro/Psych PSYCHIATRIC DISORDERS Anxiety Depression Bipolar Disorder  Neuromuscular disease negative neurological ROS  negative psych ROS   GI/Hepatic negative GI ROS, Neg liver ROS, GERD  ,  Endo/Other  negative endocrine ROS  Renal/GU Renal diseasenegative Renal ROS  negative genitourinary   Musculoskeletal   Abdominal   Peds  Hematology negative hematology ROS (+) anemia ,   Anesthesia Other Findings Malignant neoplasm of upper lobe of left lung  Reproductive/Obstetrics negative OB ROS                             Anesthesia Physical Anesthesia Plan  ASA: III  Anesthesia Plan: MAC   Post-op Pain Management:    Induction:   PONV Risk Score and Plan:   Airway Management Planned:   Additional Equipment:   Intra-op Plan:   Post-operative Plan:   Informed Consent: I have reviewed the patients History and Physical, chart, labs and discussed the procedure including the risks, benefits and alternatives for the proposed anesthesia with the patient or authorized representative who has indicated his/her understanding and acceptance.   Dental Advisory Given  Plan Discussed with: CRNA  Anesthesia Plan Comments:         Anesthesia Quick Evaluation

## 2018-02-03 NOTE — Op Note (Signed)
Patient:  [name]  DOB:  [birth year]  MRN:  [id number]   Preop Diagnosis: Left lung carcinoma, need for central venous access  Postop Diagnosis: Same  Procedure: Port-A-Cath insertion  Surgeon: [name], MD  Anes: MAC  Indications: Patient is a 72 year old black female who presents for Port-A-Cath insertion.  The patient is about to undergo chemotherapy for left lung carcinoma.  The risks and benefits of the procedure including bleeding, infection, and pneumothorax were fully explained to the patient, who gave informed consent.  Procedure note: The patient was placed in the Trendelenburg position after the left upper chest was prepped and draped using the usual sterile technique with DuraPrep.  Surgical site confirmation was performed.  1% Xylocaine was used for local anesthesia.  An incision was made below the left clavicle.  Subcutaneous pocket was formed.  A needle was advanced into the left subclavian vein using the Seldinger technique without difficulty.  A guidewire was then advanced into the right atrium under fluoroscopic guidance.  An introducer and peel-away sheath were placed over the guidewire.  The catheter was then inserted through the peel-away sheath and the peel-away sheath was removed.  The catheter was then attached to the port and the port placed in subcutaneous pocket.  Adequate positioning was confirmed by fluoroscopy.  Good backflow blood was noted on aspiration of the port.  The port was flushed with heparin flush.  The subcutaneous layer was reapproximated using a 3-0 Vicryl interrupted suture.  The skin was closed using a 4-0 Monocryl subcuticular suture.  Dermabond was applied.  All tape and needle counts were correct at the end of the procedure.  Patient was awakened and transferred to PACU in stable condition.  Complications: None  EBL: Minimal  Specimen: None

## 2018-02-04 ENCOUNTER — Encounter (HOSPITAL_COMMUNITY): Payer: Self-pay | Admitting: General Surgery

## 2018-02-05 DIAGNOSIS — C3412 Malignant neoplasm of upper lobe, left bronchus or lung: Secondary | ICD-10-CM | POA: Diagnosis not present

## 2018-02-06 ENCOUNTER — Telehealth: Payer: Self-pay | Admitting: Family Medicine

## 2018-02-06 ENCOUNTER — Encounter: Payer: Self-pay | Admitting: Family Medicine

## 2018-02-06 ENCOUNTER — Ambulatory Visit (INDEPENDENT_AMBULATORY_CARE_PROVIDER_SITE_OTHER): Payer: PPO | Admitting: Family Medicine

## 2018-02-06 VITALS — BP 142/70 | HR 105 | Resp 16 | Ht 63.0 in | Wt 222.0 lb

## 2018-02-06 DIAGNOSIS — G8929 Other chronic pain: Secondary | ICD-10-CM | POA: Diagnosis not present

## 2018-02-06 DIAGNOSIS — I10 Essential (primary) hypertension: Secondary | ICD-10-CM | POA: Diagnosis not present

## 2018-02-06 DIAGNOSIS — F325 Major depressive disorder, single episode, in full remission: Secondary | ICD-10-CM

## 2018-02-06 DIAGNOSIS — C3412 Malignant neoplasm of upper lobe, left bronchus or lung: Secondary | ICD-10-CM | POA: Diagnosis not present

## 2018-02-06 MED ORDER — HYDROCODONE-ACETAMINOPHEN 5-325 MG PO TABS: ORAL_TABLET | ORAL | 0 refills | Status: AC

## 2018-02-06 NOTE — Telephone Encounter (Signed)
FMLA Copy Sleeved Noted --

## 2018-02-06 NOTE — Progress Notes (Signed)
Fill Date ID Written Drug Qty Days Prescriber Rx # Pharmacy Refill Daily Dose * Pymt Type PMP  01/08/2018  3  10/23/2017  Hydrocodone-Acetamin 5-325 Mg  90 30 Ma Sim  [id number]  [facility] ([id number])  0 15.00 MME Comm Ins  [place]  12/21/2017  3  11/06/2017  Alprazolam 1 Mg Tablet  60 30 Ma Sim  [id number]  [facility] (0327)  1 4.00 LME Comm Ins  [place]  12/10/2017  3  10/23/2017  Hydrocodone-Acetamin 5-325 Mg  90 30 Ma Sim  [id number]  [facility] (0327)  0 15.00 MME Comm Ins  [place]  11/21/2017  3  11/06/2017  Alprazolam 1 Mg Tablet  60 30 Ma Sim  [id number]  [facility] ([id number])  0 4.00 LME Comm Ins  [place]  11/09/2017  3  11/07/2017  Hydrocodone-Acetamin 5-325 Mg  90 30 Ma Sim  [id number]  [facility] (0327)  0 15.00 MME Comm Ins  [place]  10/19/2017  3  05/24/2017  Alprazolam 1 Mg Tablet  60 30 Ma Sim  [id number]  [facility] (0327)  0 4.00 LME Comm Ins  [place]  10/10/2017  3  10/09/2017  Hydrocodone-Acetamin 5-325 Mg  90 30 Ma Sim  [id number]  [facility] (0327)  0 15.00 MME Comm Ins  [place]  09/20/2017  2  05/24/2017  Alprazolam 1 Mg Tablet  60 30 Ma Sim  [id number]  [facility] (0327)  4 4.00 LME Medicare  [place]  09/09/2017  1  08/14/2017  Hydrocodone-Acetamin 5-325 Mg  90 26 Ma Sim  [id number]  [facility] (0327)  0 15.00 MME Medicare  [place]  08/22/2017  2  05/24/2017  Alprazolam 1 Mg Tablet  60 30 Ma Sim  [id number]  [facility] (0327)  3 4.00 LME Medicare  [place]  08/07/2017  2  05/21/2017  Hydrocodone-Acetamin 5-325 Mg  90 30 Ma Sim  [id number]  [facility] (0327)  0 15.00 MME Medicare  [place]  07/24/2017  2  05/24/2017  Alprazolam 1 Mg Tablet

## 2018-02-06 NOTE — Patient Instructions (Signed)
F/U in 12 weeks, call if you need me before  All the best with treatment, and continue to keep a positive attitude   Eat healthily   Keep as active as you can be

## 2018-02-07 DIAGNOSIS — C3412 Malignant neoplasm of upper lobe, left bronchus or lung: Secondary | ICD-10-CM | POA: Diagnosis not present

## 2018-02-09 ENCOUNTER — Encounter: Payer: Self-pay | Admitting: Family Medicine

## 2018-02-09 MED ORDER — HYDROCODONE-ACETAMINOPHEN 5-325 MG PO TABS: ORAL_TABLET | ORAL | 0 refills | Status: DC

## 2018-02-09 NOTE — Assessment & Plan Note (Signed)
Controlled, no change in medication  

## 2018-02-09 NOTE — Assessment & Plan Note (Signed)
Deteriorated. Patient re-educated about  the importance of commitment to a  minimum of 150 minutes of exercise per week.  The importance of healthy food choices with portion control discussed. Encouraged to start a food diary, count calories and to consider  joining a support group. Sample diet sheets offered. Goals set by the patient for the next several months.   Weight /BMI 02/06/2018 01/28/2018 01/28/2018  WEIGHT 222 lb 220 lb 220 lb  HEIGHT 5\' 3"  5\' 3"  5\' 3"   BMI 39.33 kg/m2 38.97 kg/m2 38.97 kg/m2

## 2018-02-09 NOTE — Assessment & Plan Note (Addendum)
Pt to start chemotherapy in the near future, porta cath has been place, no surgical option due to close proximity to major artery,

## 2018-02-09 NOTE — Assessment & Plan Note (Signed)
The patient's Controlled Substance registry is reviewed and compliance confirmed. Adequacy of  Pain control and level of function is assessed. Medication dosing is adjusted as deemed appropriate. Twelve weeks of medication is prescribed , patient signs for the script and is provided with a follow up appointment between 11 to 12 weeks .  

## 2018-02-09 NOTE — Progress Notes (Signed)
[name]     MRN: [id number]      DOB: 06/08/1946   HPI [name] is here for follow up and re-evaluation of chronic medical conditions, medication management in particular chronic pain mange and review of any available recent lab and radiology data.  Preventive health is updated, specifically  Cancer screening and Immunization.   Pt had porta cath placed earlier this week and will start chemotherapy for inoperable ling cancer based on it close proximity to a [place] artery. She is extremely optimistic that she is in God's hands and she plans to beat this cancer. She no longer smokes, ah's not since last hospitalization when she was diagnosed with cancer , and is  Moving forward with a positives attitude. States her souse gets oher nerves as he smothers her with concern, she wants to be treated ": normally"The PT denies any adverse reactions to current medications since the last visit. She denies any additional pain needs, and is getting no pain medication from any other Provider at this time  When asked about weight strategy she told me that her oncologist doe not want her to lose weight , so I encouraged her to try to make healthy food choices and to commit to regular activity. ROS Denies recent fever or chills. Denies sinus pressure, nasal congestion, ear pain or sore throat. Denies chest congestion, productive cough or wheezing. Denies chest pains, palpitations and leg swelling Denies abdominal pain, nausea, vomiting,diarrhea or constipation.   Denies dysuria, frequency, hesitancy or incontinence. Denies uncontrolled joint pain, swelling and limitation in mobility. Denies headaches, seizures, numbness, or tingling. Denies depression, does c/o increased anxiety denies  Insomnia.Likely an element of denial currently or defiance/ fight as she tackles thrips devastating  diagnosis , that has recently caused the loss of 2 of her siblings, which is not only understanding ,  But a good [place]  I believe at this time, she does acknowledge and is very aware of her dx Denies skin break down or rash.   PE  BP (!) 142/70   Pulse (!) 105   Resp 16   Ht 5\' 3"  (1.6 m)   Wt 222 lb (100.7 kg)   SpO2 90%   BMI 39.33 kg/m   Patient alert and oriented and in no cardiopulmonary distress.  HEENT: No facial asymmetry, EOMI,   oropharynx pink and moist.  Neck supple no JVD, no mass.  Chest: Clear to auscultation bilaterally.decreased though adequate air entry CVS: S1, S2 no murmurs, no S3.Regular rate.  ABD: Soft non tender.   Ext: No edema  MS: Adequate though reduced  ROM lumbar  Spine,normal in shoulders, hips and knees.  Skin: Intact, no ulcerations or rash noted.  Psych: Good eye contact, normal affect. Memory intact , mildly  anxious not  depressed appearing. CNS: CN 2-12 intact, power,  normal throughout.no focal deficits noted.   Assessment & Plan  Encounter for chronic pain management The patient's Controlled Substance registry is reviewed and compliance confirmed. Adequacy of  Pain control and level of function is assessed. Medication dosing is adjusted as deemed appropriate. Twelve weeks of medication is prescribed , patient signs for the script and is provided with a follow up appointment between 11 to 12 weeks .   Depression Controlled, no change in medication   Morbid obesity ([place]) Deteriorated. Patient re-educated about  the importance of commitment to a  minimum of 150 minutes of exercise per week.  The importance of healthy food choices with  portion control discussed. Encouraged to start a food diary, count calories and to consider  joining a support group. Sample diet sheets offered. Goals set by the patient for the next several months.   Weight /BMI 02/06/2018 01/28/2018 01/28/2018  WEIGHT 222 lb 220 lb 220 lb  HEIGHT 5\' 3"  5\' 3"  5\' 3"   BMI 39.33 kg/m2 38.97 kg/m2 38.97 kg/m2      Essential hypertension Uncontrolled with ongoing weight gain,  encouraged weight loss and increased exercise, no med change DASH diet and commitment to daily physical activity for a minimum of 30 minutes discussed and encouraged, as a part of hypertension management. The importance of attaining a healthy weight is also discussed.  BP/Weight 02/06/2018 02/03/2018 01/28/2018 01/28/2018 01/21/2018 08/15/1592 12/19/5927  Systolic BP 244 628 638 177 116 579 038  Diastolic BP 70 61 75 97 61 83 68  Wt. (Lbs) 222 - 220 220 218.1 230 224.2  BMI 39.33 - 38.97 38.97 38.03 40.74 39.72       Malignant neoplasm of upper lobe of left lung (HCC) Pt to start chemotherapy in the near future, porta cath has been place, no surgical option due to close proximity to major artery,

## 2018-02-09 NOTE — Assessment & Plan Note (Signed)
Uncontrolled with ongoing weight gain, encouraged weight loss and increased exercise, no med change DASH diet and commitment to daily physical activity for a minimum of 30 minutes discussed and encouraged, as a part of hypertension management. The importance of attaining a healthy weight is also discussed.  BP/Weight 02/06/2018 02/03/2018 01/28/2018 01/28/2018 01/21/2018 02/07/3661 04/16/7653  Systolic BP 650 354 656 812 751 700 174  Diastolic BP 70 61 75 97 61 83 68  Wt. (Lbs) 222 - 220 220 218.1 230 224.2  BMI 39.33 - 38.97 38.97 38.03 40.74 39.72

## 2018-02-11 ENCOUNTER — Encounter (HOSPITAL_COMMUNITY): Payer: Self-pay | Admitting: General Practice

## 2018-02-11 ENCOUNTER — Inpatient Hospital Stay (HOSPITAL_COMMUNITY): Payer: PPO

## 2018-02-11 ENCOUNTER — Other Ambulatory Visit: Payer: Self-pay

## 2018-02-11 ENCOUNTER — Inpatient Hospital Stay (HOSPITAL_COMMUNITY): Payer: PPO | Attending: Hematology | Admitting: Hematology

## 2018-02-11 ENCOUNTER — Encounter (HOSPITAL_COMMUNITY): Payer: Self-pay | Admitting: Hematology

## 2018-02-11 VITALS — BP 144/80 | HR 103 | Temp 98.1°F | Resp 18 | Wt 225.0 lb

## 2018-02-11 VITALS — BP 154/79 | HR 99 | Temp 97.9°F | Resp 18

## 2018-02-11 DIAGNOSIS — I10 Essential (primary) hypertension: Secondary | ICD-10-CM | POA: Diagnosis not present

## 2018-02-11 DIAGNOSIS — Z5111 Encounter for antineoplastic chemotherapy: Secondary | ICD-10-CM | POA: Diagnosis not present

## 2018-02-11 DIAGNOSIS — Z923 Personal history of irradiation: Secondary | ICD-10-CM | POA: Diagnosis not present

## 2018-02-11 DIAGNOSIS — F319 Bipolar disorder, unspecified: Secondary | ICD-10-CM

## 2018-02-11 DIAGNOSIS — E785 Hyperlipidemia, unspecified: Secondary | ICD-10-CM | POA: Diagnosis not present

## 2018-02-11 DIAGNOSIS — D649 Anemia, unspecified: Secondary | ICD-10-CM

## 2018-02-11 DIAGNOSIS — K5900 Constipation, unspecified: Secondary | ICD-10-CM | POA: Diagnosis not present

## 2018-02-11 DIAGNOSIS — Z7982 Long term (current) use of aspirin: Secondary | ICD-10-CM | POA: Diagnosis not present

## 2018-02-11 DIAGNOSIS — F419 Anxiety disorder, unspecified: Secondary | ICD-10-CM

## 2018-02-11 DIAGNOSIS — M549 Dorsalgia, unspecified: Secondary | ICD-10-CM

## 2018-02-11 DIAGNOSIS — C3492 Malignant neoplasm of unspecified part of left bronchus or lung: Secondary | ICD-10-CM

## 2018-02-11 DIAGNOSIS — E669 Obesity, unspecified: Secondary | ICD-10-CM

## 2018-02-11 DIAGNOSIS — M1990 Unspecified osteoarthritis, unspecified site: Secondary | ICD-10-CM | POA: Diagnosis not present

## 2018-02-11 DIAGNOSIS — C3402 Malignant neoplasm of left main bronchus: Secondary | ICD-10-CM | POA: Diagnosis not present

## 2018-02-11 DIAGNOSIS — Z79899 Other long term (current) drug therapy: Secondary | ICD-10-CM | POA: Diagnosis not present

## 2018-02-11 DIAGNOSIS — Z833 Family history of diabetes mellitus: Secondary | ICD-10-CM | POA: Diagnosis not present

## 2018-02-11 DIAGNOSIS — Z808 Family history of malignant neoplasm of other organs or systems: Secondary | ICD-10-CM | POA: Diagnosis not present

## 2018-02-11 DIAGNOSIS — K219 Gastro-esophageal reflux disease without esophagitis: Secondary | ICD-10-CM

## 2018-02-11 DIAGNOSIS — M129 Arthropathy, unspecified: Secondary | ICD-10-CM | POA: Diagnosis not present

## 2018-02-11 DIAGNOSIS — Z7951 Long term (current) use of inhaled steroids: Secondary | ICD-10-CM | POA: Diagnosis not present

## 2018-02-11 DIAGNOSIS — Z87891 Personal history of nicotine dependence: Secondary | ICD-10-CM | POA: Diagnosis not present

## 2018-02-11 DIAGNOSIS — R5383 Other fatigue: Secondary | ICD-10-CM | POA: Diagnosis not present

## 2018-02-11 DIAGNOSIS — Z801 Family history of malignant neoplasm of trachea, bronchus and lung: Secondary | ICD-10-CM | POA: Diagnosis not present

## 2018-02-11 DIAGNOSIS — Z803 Family history of malignant neoplasm of breast: Secondary | ICD-10-CM | POA: Diagnosis not present

## 2018-02-11 DIAGNOSIS — R634 Abnormal weight loss: Secondary | ICD-10-CM

## 2018-02-11 DIAGNOSIS — F17210 Nicotine dependence, cigarettes, uncomplicated: Secondary | ICD-10-CM

## 2018-02-11 DIAGNOSIS — B370 Candidal stomatitis: Secondary | ICD-10-CM | POA: Insufficient documentation

## 2018-02-11 DIAGNOSIS — Z79891 Long term (current) use of opiate analgesic: Secondary | ICD-10-CM | POA: Diagnosis not present

## 2018-02-11 DIAGNOSIS — G939 Disorder of brain, unspecified: Secondary | ICD-10-CM | POA: Diagnosis not present

## 2018-02-11 DIAGNOSIS — G8929 Other chronic pain: Secondary | ICD-10-CM | POA: Diagnosis not present

## 2018-02-11 DIAGNOSIS — C3412 Malignant neoplasm of upper lobe, left bronchus or lung: Secondary | ICD-10-CM | POA: Diagnosis not present

## 2018-02-11 DIAGNOSIS — J449 Chronic obstructive pulmonary disease, unspecified: Secondary | ICD-10-CM | POA: Diagnosis not present

## 2018-02-11 DIAGNOSIS — Z809 Family history of malignant neoplasm, unspecified: Secondary | ICD-10-CM | POA: Diagnosis not present

## 2018-02-11 LAB — CBC WITH DIFFERENTIAL/PLATELET
BASOS PCT: 0 %
Basophils Absolute: 0 10*3/uL (ref 0.0–0.1)
Eosinophils Absolute: 0.1 10*3/uL (ref 0.0–0.7)
Eosinophils Relative: 1 %
HEMATOCRIT: 34.2 % — AB (ref 36.0–46.0)
HEMOGLOBIN: 10.2 g/dL — AB (ref 12.0–15.0)
LYMPHS ABS: 2.6 10*3/uL (ref 0.7–4.0)
LYMPHS PCT: 33 %
MCH: 24.6 pg — ABNORMAL LOW (ref 26.0–34.0)
MCHC: 29.8 g/dL — AB (ref 30.0–36.0)
MCV: 82.6 fL (ref 78.0–100.0)
MONO ABS: 0.7 10*3/uL (ref 0.1–1.0)
Monocytes Relative: 9 %
NEUTROS ABS: 4.4 10*3/uL (ref 1.7–7.7)
Neutrophils Relative %: 57 %
Platelets: 315 10*3/uL (ref 150–400)
RBC: 4.14 MIL/uL (ref 3.87–5.11)
RDW: 16.5 % — ABNORMAL HIGH (ref 11.5–15.5)
WBC: 7.7 10*3/uL (ref 4.0–10.5)

## 2018-02-11 LAB — COMPREHENSIVE METABOLIC PANEL
ALBUMIN: 3.3 g/dL — AB (ref 3.5–5.0)
ALK PHOS: 75 U/L (ref 38–126)
ALT: 11 U/L (ref 0–44)
AST: 14 U/L — ABNORMAL LOW (ref 15–41)
Anion gap: 10 (ref 5–15)
BILIRUBIN TOTAL: 0.5 mg/dL (ref 0.3–1.2)
BUN: 17 mg/dL (ref 8–23)
CALCIUM: 9.1 mg/dL (ref 8.9–10.3)
CO2: 28 mmol/L (ref 22–32)
Chloride: 105 mmol/L (ref 98–111)
Creatinine, Ser: 0.65 mg/dL (ref 0.44–1.00)
GFR calc Af Amer: >60 mL/min (ref >60)
GLUCOSE: 119 mg/dL — AB (ref 70–99)
Potassium: 4.4 mmol/L (ref 3.5–5.1)
Sodium: 143 mmol/L (ref 135–145)
TOTAL PROTEIN: 7.8 g/dL (ref 6.5–8.1)

## 2018-02-11 MED ORDER — SODIUM CHLORIDE 0.9 % IV SOLN
45.0000 mg/m2 | Freq: Once | INTRAVENOUS | Status: AC
  Administered 2018-02-11: 96 mg via INTRAVENOUS
  Filled 2018-02-11: qty 16

## 2018-02-11 MED ORDER — HEPARIN SOD (PORK) LOCK FLUSH 100 UNIT/ML IV SOLN
500.0000 [IU] | Freq: Once | INTRAVENOUS | Status: AC | PRN
  Administered 2018-02-11: 500 [IU]

## 2018-02-11 MED ORDER — PALONOSETRON HCL INJECTION 0.25 MG/5ML
0.2500 mg | Freq: Once | INTRAVENOUS | Status: AC
  Administered 2018-02-11: 0.25 mg via INTRAVENOUS

## 2018-02-11 MED ORDER — SODIUM CHLORIDE 0.9 % IV SOLN
20.0000 mg | Freq: Once | INTRAVENOUS | Status: AC
  Administered 2018-02-11: 20 mg via INTRAVENOUS
  Filled 2018-02-11: qty 2

## 2018-02-11 MED ORDER — FAMOTIDINE IN NACL 20-0.9 MG/50ML-% IV SOLN
20.0000 mg | Freq: Once | INTRAVENOUS | Status: AC
  Administered 2018-02-11: 20 mg via INTRAVENOUS

## 2018-02-11 MED ORDER — FAMOTIDINE IN NACL 20-0.9 MG/50ML-% IV SOLN
INTRAVENOUS | Status: AC
  Filled 2018-02-11: qty 50

## 2018-02-11 MED ORDER — SODIUM CHLORIDE 0.9 % IV SOLN
Freq: Once | INTRAVENOUS | Status: AC
  Administered 2018-02-11: 500 mL via INTRAVENOUS

## 2018-02-11 MED ORDER — DIPHENHYDRAMINE HCL 50 MG/ML IJ SOLN
50.0000 mg | Freq: Once | INTRAMUSCULAR | Status: AC
  Administered 2018-02-11: 50 mg via INTRAVENOUS

## 2018-02-11 MED ORDER — PALONOSETRON HCL INJECTION 0.25 MG/5ML
INTRAVENOUS | Status: AC
  Filled 2018-02-11: qty 5

## 2018-02-11 MED ORDER — DIPHENHYDRAMINE HCL 50 MG/ML IJ SOLN
INTRAMUSCULAR | Status: AC
  Filled 2018-02-11: qty 1

## 2018-02-11 MED ORDER — SODIUM CHLORIDE 0.9% FLUSH
10.0000 mL | INTRAVENOUS | Status: DC | PRN
  Administered 2018-02-11: 10 mL
  Filled 2018-02-11: qty 10

## 2018-02-11 MED ORDER — HEPARIN SOD (PORK) LOCK FLUSH 100 UNIT/ML IV SOLN
INTRAVENOUS | Status: AC
  Filled 2018-02-11: qty 5

## 2018-02-11 MED ORDER — CARBOPLATIN CHEMO INJECTION 450 MG/45ML
215.8000 mg | Freq: Once | INTRAVENOUS | Status: AC
  Administered 2018-02-11: 220 mg via INTRAVENOUS
  Filled 2018-02-11: qty 22

## 2018-02-11 NOTE — Assessment & Plan Note (Signed)
1.  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  She was evaluated by Dr. Roxan Hockey on 01/08/2018 and felt not to be a surgical candidate. - We talked about the chemotherapy regimen including weekly carboplatin and paclitaxel in detail.  We talked about the side effects at length.  We have reviewed her blood work today.  She will proceed with her first weekly carboplatin and paclitaxel today.  She started radiation today.  2.  Family history: The 2 of her sisters died of stomach cancer.  Father died of lung cancer.  Maternal aunt died of thyroid cancer.  Maternal grandfather had cancer in the bone.  Primary is unknown.  We will consider genetic testing.

## 2018-02-11 NOTE — Progress Notes (Signed)
Patient for first treatment of chemotherapy.  Consent reviewed and signed.  Reviewed chemotherapy new patient packet with all questions asked and answered.  Patient stated she has her compazine for nausea if needed.  Patient verbalized understanding for chemotherapy.    Patients port clean and dry before accessing for treatment.  No bruising or drainage noted with incision site.  Skin glue intact.  No complaints of pain with access and good blood return noted.   Patient tolerated chemotherapy with no complaints voiced.  Port site clean and dry with good blood return noted before and after administration of chemo.  Band aid applied with VSS with discharge and no s/s of distress noted.  Reminded the patient of after hours phone number to call for any questions or problems with understanding verbalized.  Patient left by wheelchair with family.

## 2018-02-11 NOTE — Progress Notes (Signed)
[facility] CSW Progress Note  Brief visit w patient on her first chemotherapy treatment to assess for needs/resources.  Introduced self and discussed availability of supportive services while undergoing treatment. Patient accompanied by spouse and granddaughter.  Feels good support from family members.  Relies on faith resources to maintain positive and accepting of cancer diagnosis, prognosis and treatment plan.  Good support in community, no needs identified at this time.  Discussed available community resources; provided [place] calendar of activities.  Edwyna Shell, LCSW Clinical Social Worker Phone:  [phone]

## 2018-02-11 NOTE — Progress Notes (Signed)
[name], [place] [address]   CLINIC:  Medical Oncology/Hematology  PCP:  Melanie Helper, MD [address] [place] [address] [phone]   REASON FOR VISIT:  Follow-up for stage III lung cancer  CURRENT THERAPY: Radiation therapy with  Carboplatin and Paclitaxel    INTERVAL HISTORY:  [name] 72 y.o. female returns for routine follow-up and first treatment of Caroplatin and Paclitaxel today. This will be her first cycle. She received radiation this morning and tolerated it well with no complications. Patient has lost about 10 pounds in the last 6-8 weeks but has been drinking boast twice a week to keep her weight up. She has chronic back pain which is stable at this time. Deneis any new pain today. Patient denies cough or hemoptysis. Denies any nausea, vomiting, or diarrhea. Patient is able to do all of her own household activities. Patient is accompanied today by her husband and daughter. Overall, she tells me she has been feeling pretty well. Energy levels 75%; appetite 75%.     REVIEW OF SYSTEMS:  Review of Systems  Musculoskeletal: Positive for back pain (Chronic).  All other systems reviewed and are negative.  BRIEF ONCOLOGIC HISTORY:        Squamous cell lung cancer, left ([facility])   10/28/2017 Imaging    CT scan of the chest shows left hilar mass measuring 3.4 x 2.9 cm, multiple soft tissue masses in the left upper lobe, largest measuring 1.6 cm  MRI of the brain on 10/31/2017 shows dural based focus of contrast enhancement along the superior left convexity, meningioma favored as it was present on MRI in 2009      10/30/2017 Initial Biopsy    Bronchoscopy and biopsy consistent with squamous cell cancer of the lung      11/08/2017 Family History    2 sisters died of stomach cancer Father died of lung cancer in a smoker Maternal aunt died of thyroid cancer Maternal grandfather had cancer spread to  the bone         PAST MEDICAL/SURGICAL HISTORY:  Past Medical History:  Diagnosis Date  . Anxiety   . Arthritis   . Bipolar disorder ([facility])   . Chronic back pain   . COPD (chronic obstructive pulmonary disease) ([facility])   . 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 GERD (gastroesophageal reflux disease) 2000  . Hyperlipidemia 1995  . Hypertension 1995  . Nicotine addiction   . Obesity    Past Surgical History:  Procedure Laterality Date  . BACK SURGERY  1999  . BRONCHIAL BRUSHINGS Left 10/30/2017   Procedure: BRONCHIAL BRUSHINGS;  Surgeon: [name], MD;  [facility];  Service: Cardiopulmonary;  Laterality: Left;  . BRONCHIAL WASHINGS Left 10/30/2017   Procedure: BRONCHIAL WASHINGS;  Surgeon: [name], MD;  Location: [facility];  Service: Cardiopulmonary;  Laterality: Left;  . CHOLECYSTECTOMY  2001  . COLONOSCOPY N/A 02/02/2016   Procedure: COLONOSCOPY;  Surgeon: [name], MD;  Location: [facility];  Service: Endoscopy;  Laterality: N/A;  930  . FLEXIBLE BRONCHOSCOPY Bilateral 10/30/2017   Procedure: FLEXIBLE BRONCHOSCOPY;  Surgeon: [name], MD;  Location: [facility];  Service: Cardiopulmonary;  Laterality: Bilateral;  . INCISIONAL HERNIA REPAIR  August 17, 2008  . left inguinal hernia herniorrhapy  1980  . PORTACATH PLACEMENT Left 02/03/2018   Procedure: INSERTION PORT-A-CATH;  Surgeon: Arnoldo Morale,  Elta Guadeloupe, MD;  Location: [facility];  Service: General;  Laterality: Left;  . removal of thmoma  03/27/2010   Dr. Arlyce Dice   . SPINE SURGERY    . TONSILLECTOMY    . TOTAL ABDOMINAL HYSTERECTOMY W/ BILATERAL SALPINGOOPHORECTOMY  1984     SOCIAL HISTORY:  Social History   Socioeconomic History  . Marital status: Married    Spouse name: Not on file  . Number of children: Not on file  . Years of education: Not on file  . Highest education level: Not on file  Occupational History  . 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 Financial resource strain: Not hard at all  . Food insecurity:    Worry: Never true    Inability: Never true  . Transportation needs:    Medical: No    Non-medical: No  Tobacco Use  . Smoking status: Former Smoker    Packs/day: 1.50    Years: 54.00    Pack years: 81.00    Types: Cigarettes    Last attempt to quit: 08/20/2017    Years since quitting: 0.4  . Smokeless tobacco: Never Used  Substance and Sexual Activity  . Alcohol use: No    Alcohol/week: 0.0 oz  . Drug use: No  . Sexual activity: Not Currently  Lifestyle  . Physical activity:    Days per week: 0 days    Minutes per session: 0 min  . Stress: Only a little  Relationships  . Social connections:    Talks on phone: More than three times a week    Gets together: More than three times a week    Attends religious service: More than 4 times per year    Active member of club or organization: No    Attends meetings of clubs or organizations: Never    Relationship status: Married  . Intimate partner violence:    Fear of current or ex partner: No    Emotionally abused: No    Physically abused: No    Forced sexual activity: No  Other Topics Concern  . Not on file  Social History Narrative   Pt had 2 stillborns     FAMILY HISTORY:  Family History  Problem Relation Age of Onset  . Heart disease Mother        enlarged  . Diabetes Mother   . Hypertension Mother   . Cancer Father        throat  . Hypertension Father   . Coronary artery disease Father   . Diabetes Sister        x3  . Asthma Sister   . Kidney disease Brother   . Stroke Brother     CURRENT MEDICATIONS:  Outpatient Encounter Medications as of 02/11/2018  Medication Sig  . albuterol (PROVENTIL HFA;VENTOLIN HFA) 108 (90 Base) MCG/ACT inhaler INHALE 2 PUFFS BY MOUTH EVERY 6 HOURS IF NEEDED  . albuterol (PROVENTIL) (2.5 MG/3ML) 0.083% nebulizer solution INHALE ONE VIAL VIA NEBULIZER  EVERY 6 HOURS AS NEEDED.  Marland Kitchen ALPRAZolam (XANAX) 1 MG tablet Take 1 tablet (1 mg total) by mouth 2 (two) times daily.  Marland Kitchen aspirin (ASPIRIN LOW DOSE) 81 MG EC tablet Take 81 mg by mouth daily.    . celecoxib (CELEBREX) 400 MG capsule TAKE ONE CAPSULE BY MOUTH ONCE DAILY  . diltiazem (CARDIZEM CD) 120 MG 24 hr capsule TAKE 1 TABLET ONCE DAILY  . EPINEPHrine 0.3 mg/0.3 mL IJ  SOAJ injection Inject 0.3 mg into the muscle once.  Marland Kitchen FLUoxetine (PROZAC) 20 MG capsule TAKE 1 TABLET ONCE DAILY  . furosemide (LASIX) 20 MG tablet Take 1 tablet (20 mg total) by mouth 2 (two) times daily.  Marland Kitchen guaiFENesin (MUCINEX) 600 MG 12 hr tablet Take 1 tablet (600 mg total) by mouth 2 (two) times daily.  Marland Kitchen HYDROcodone-acetaminophen (NORCO/VICODIN) 5-325 MG tablet One tablet three times daily for back pain  . [START ON 03/10/2018] HYDROcodone-acetaminophen (NORCO/VICODIN) 5-325 MG tablet One tablet three times daily for  chronic back pain  . [START ON 04/09/2018] HYDROcodone-acetaminophen (NORCO/VICODIN) 5-325 MG tablet One tablet three times daily for chronic back pain  . lidocaine-prilocaine (EMLA) cream Apply a quarter size amount to port site 1 hour prior to chemo. Do not rub in. Cover with plastic wrap.  . lisinopril (PRINIVIL,ZESTRIL) 10 MG tablet Take 1 tablet (10 mg total) by mouth daily.  Marland Kitchen omeprazole (PRILOSEC) 40 MG capsule TAKE 1 CAPSULE ONCE DAILY  . potassium chloride SA (K-DUR,KLOR-CON) 20 MEQ tablet Take 1 tablet (20 mEq total) by mouth daily.  . prochlorperazine (COMPAZINE) 5 MG tablet   . rosuvastatin (CRESTOR) 20 MG tablet Take 1 tablet (20 mg total) by mouth daily.  . SYMBICORT 80-4.5 MCG/ACT inhaler INHALE 2 PUFFS BY MOUTH TWICE DAILY. RINSE MOUTH AFTER USE  . tiZANidine (ZANAFLEX) 4 MG tablet Take 1 tablet (4 mg total) by mouth 3 (three) times daily.  . [DISCONTINUED] prochlorperazine (COMPAZINE) 10 MG tablet Take 1 tablet (10 mg total) by mouth every 6 (six) hours as needed for nausea or vomiting.   No  facility-administered encounter medications on file as of 02/11/2018.     ALLERGIES:  Allergies  Allergen Reactions  . Bee Venom Anaphylaxis  . Codeine   . Penicillins Other (See Comments)    Blisters in mouth after dental work Has patient had a PCN reaction causing immediate rash, facial/tongue/throat swelling, SOB or lightheadedness with hypotension: Yes Has patient had a PCN reaction causing severe rash involving mucus membranes or skin necrosis: No Has patient had a PCN reaction that required hospitalization No Has patient had a PCN reaction occurring within the last 10 years: Yes If all of the above answers are "NO", then may proceed with Cephalosporin use.      PHYSICAL EXAM:  ECOG Performance status: 0  Vital signs: BP: 144/80, Pulse: 103, Resp:18, Temp: 98.1, 02SAT: 96%   Physical Exam   LABORATORY DATA:  I have reviewed the labs as listed.  CBC    Component Value Date/Time   WBC 7.7 02/11/2018 0841   RBC 4.14 02/11/2018 0841   HGB 10.2 (L) 02/11/2018 0841   HCT 34.2 (L) 02/11/2018 0841   PLT 315 02/11/2018 0841   MCV 82.6 02/11/2018 0841   MCH 24.6 (L) 02/11/2018 0841   MCHC 29.8 (L) 02/11/2018 0841   RDW 16.5 (H) 02/11/2018 0841   LYMPHSABS 2.6 02/11/2018 0841   MONOABS 0.7 02/11/2018 0841   EOSABS 0.1 02/11/2018 0841   BASOSABS 0.0 02/11/2018 0841   CMP Latest Ref Rng & Units 02/11/2018 01/28/2018 10/31/2017  Glucose 70 - 99 mg/dL 119(H) 98 157(H)  BUN 8 - 23 mg/dL 17 26(H) 13  Creatinine 0.44 - 1.00 mg/dL 0.65 0.88 0.66  Sodium 135 - 145 mmol/L 143 138 136  Potassium 3.5 - 5.1 mmol/L 4.4 4.5 3.5  Chloride 98 - 111 mmol/L 105 102 96(L)  CO2 22 - 32 mmol/L _0 Calcium 8.9 - 10.3 mg/dL  9.1 9.0 8.4(L)  Total Protein 6.5 - 8.1 g/dL 7.8 - -  Total Bilirubin 0.3 - 1.2 mg/dL 0.5 - -  Alkaline Phos 38 - 126 U/L 75 - -  AST 15 - 41 U/L 14(L) - -  ALT 0 - 44 U/L 11 - -         ASSESSMENT & PLAN:   Squamous cell lung cancer, left ([facility]) 1.  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  She was evaluated by Dr. Roxan Hockey on 01/08/2018 and felt not to be a surgical candidate. - We talked about the chemotherapy regimen including weekly carboplatin and paclitaxel in detail.  We talked about the side effects at length.  We have reviewed her blood work today.  She will proceed with her first weekly carboplatin and paclitaxel today.  She started radiation today.  2.  Family history: The 2 of her sisters died of stomach cancer.  Father died of lung cancer.  Maternal aunt died of thyroid cancer.  Maternal grandfather had cancer in the bone.  Primary is unknown.  We will consider genetic testing.      Orders placed this encounter:  Orders Placed This Encounter  Procedures  . Iron and TIBC  . Vitamin B12  . Folate  . CBC with Differential/Platelet  . Comprehensive metabolic panel      Derek Jack, MD [place] [phone]

## 2018-02-12 ENCOUNTER — Telehealth: Payer: Self-pay | Admitting: Family Medicine

## 2018-02-12 ENCOUNTER — Telehealth (HOSPITAL_COMMUNITY): Payer: Self-pay

## 2018-02-12 DIAGNOSIS — C3492 Malignant neoplasm of unspecified part of left bronchus or lung: Secondary | ICD-10-CM

## 2018-02-12 DIAGNOSIS — K5900 Constipation, unspecified: Secondary | ICD-10-CM

## 2018-02-12 NOTE — Telephone Encounter (Signed)
Chemo 24 hour phone call.  Patient stated she is doing well but has a dull headache.  Reminded the patient Aloxi can cause a headache and to use Tylenol if needed.  The patient stated she recently took some Tylenol and has noted some relief.  The patient also stated she has not had a bowel movement today and her normal is three times a day.  Reminded the patient of Colace instructions in her chemotherapy instruction packed and to start the colace now.  Also, instructed the patient if she has not had a bowel movement by tomorrow afternoon and she was still concerned due to her normal bowel habits to try Miralax and continue the colace and call us Friday morning if she still has had no bowel movement.  The patient verbalized understanding.  The patient denied nausea and SOB.  No lower extremity swelling noted.

## 2018-02-12 NOTE — Telephone Encounter (Signed)
Cb# [phone] Patient called to check the status of fmla paperwork she dropped off on 02/06/18. I let her know office policy is 6-82 business days. We would call her when it was ready, shes okay with that.

## 2018-02-14 ENCOUNTER — Telehealth (HOSPITAL_COMMUNITY): Payer: Self-pay

## 2018-02-14 MED ORDER — LACTULOSE 10 GM/15ML PO SOLN: ORAL | 1 refills | Status: DC

## 2018-02-14 NOTE — Telephone Encounter (Signed)
Error

## 2018-02-14 NOTE — Addendum Note (Signed)
Addended by: Charlyne Petrin B on: 02/14/2018 11:44 AM   Modules accepted: Orders

## 2018-02-14 NOTE — Telephone Encounter (Addendum)
Patient called stating she started the colace as directed with two bowel movements on Thursday and one today.  She does not feel like she had good bowel movements these two days and now is having prn nausea with some abdominal bloating.  Reviewed with Dr. Delton Coombes with orders to send lactulose to her pharmacy per his standing orders.  Patient notified with understanding verbalized.

## 2018-02-14 NOTE — Telephone Encounter (Signed)
Called the patient to let her know her prescription was sent to [facility] and to call us on Monday to let us know how she is doing.  Verbalized understanding.

## 2018-02-17 NOTE — Telephone Encounter (Deleted)
Patient called stating she is having regular bowel movements with the lactulose.  She stopped the colace.  Returns tomorrow for treatment.

## 2018-02-17 NOTE — Telephone Encounter (Signed)
Patient called stating she stopped the colace but has been using the lactulose as directed.  Stated she is now having regular bowel movements with no difficulty.  Returns tomorrow for treatment.

## 2018-02-18 ENCOUNTER — Inpatient Hospital Stay (HOSPITAL_BASED_OUTPATIENT_CLINIC_OR_DEPARTMENT_OTHER): Payer: PPO | Admitting: Hematology

## 2018-02-18 ENCOUNTER — Inpatient Hospital Stay (HOSPITAL_COMMUNITY): Payer: PPO

## 2018-02-18 ENCOUNTER — Encounter (HOSPITAL_COMMUNITY): Payer: Self-pay | Admitting: Hematology

## 2018-02-18 ENCOUNTER — Other Ambulatory Visit (HOSPITAL_COMMUNITY): Payer: Self-pay

## 2018-02-18 ENCOUNTER — Other Ambulatory Visit: Payer: Self-pay

## 2018-02-18 VITALS — BP 132/63 | HR 78 | Temp 98.6°F | Resp 16 | Wt 218.9 lb

## 2018-02-18 DIAGNOSIS — F419 Anxiety disorder, unspecified: Secondary | ICD-10-CM

## 2018-02-18 DIAGNOSIS — R634 Abnormal weight loss: Secondary | ICD-10-CM

## 2018-02-18 DIAGNOSIS — Z5111 Encounter for antineoplastic chemotherapy: Secondary | ICD-10-CM | POA: Diagnosis not present

## 2018-02-18 DIAGNOSIS — F319 Bipolar disorder, unspecified: Secondary | ICD-10-CM

## 2018-02-18 DIAGNOSIS — Z87891 Personal history of nicotine dependence: Secondary | ICD-10-CM

## 2018-02-18 DIAGNOSIS — E785 Hyperlipidemia, unspecified: Secondary | ICD-10-CM

## 2018-02-18 DIAGNOSIS — C3492 Malignant neoplasm of unspecified part of left bronchus or lung: Secondary | ICD-10-CM

## 2018-02-18 DIAGNOSIS — E669 Obesity, unspecified: Secondary | ICD-10-CM

## 2018-02-18 DIAGNOSIS — G8929 Other chronic pain: Secondary | ICD-10-CM | POA: Diagnosis not present

## 2018-02-18 DIAGNOSIS — I10 Essential (primary) hypertension: Secondary | ICD-10-CM | POA: Diagnosis not present

## 2018-02-18 DIAGNOSIS — J449 Chronic obstructive pulmonary disease, unspecified: Secondary | ICD-10-CM | POA: Diagnosis not present

## 2018-02-18 DIAGNOSIS — K219 Gastro-esophageal reflux disease without esophagitis: Secondary | ICD-10-CM | POA: Diagnosis not present

## 2018-02-18 DIAGNOSIS — K5900 Constipation, unspecified: Secondary | ICD-10-CM

## 2018-02-18 DIAGNOSIS — M129 Arthropathy, unspecified: Secondary | ICD-10-CM

## 2018-02-18 DIAGNOSIS — C3402 Malignant neoplasm of left main bronchus: Secondary | ICD-10-CM | POA: Diagnosis not present

## 2018-02-18 DIAGNOSIS — Z923 Personal history of irradiation: Secondary | ICD-10-CM

## 2018-02-18 DIAGNOSIS — M549 Dorsalgia, unspecified: Secondary | ICD-10-CM

## 2018-02-18 DIAGNOSIS — Z803 Family history of malignant neoplasm of breast: Secondary | ICD-10-CM

## 2018-02-18 DIAGNOSIS — Z79899 Other long term (current) drug therapy: Secondary | ICD-10-CM

## 2018-02-18 DIAGNOSIS — Z808 Family history of malignant neoplasm of other organs or systems: Secondary | ICD-10-CM

## 2018-02-18 DIAGNOSIS — F17210 Nicotine dependence, cigarettes, uncomplicated: Secondary | ICD-10-CM

## 2018-02-18 DIAGNOSIS — Z801 Family history of malignant neoplasm of trachea, bronchus and lung: Secondary | ICD-10-CM

## 2018-02-18 LAB — CBC WITH DIFFERENTIAL/PLATELET
BASOS ABS: 0 10*3/uL (ref 0.0–0.1)
Basophils Relative: 0 %
EOS ABS: 0 10*3/uL (ref 0.0–0.7)
EOS PCT: 1 %
HCT: 34.1 % — ABNORMAL LOW (ref 36.0–46.0)
Hemoglobin: 10 g/dL — ABNORMAL LOW (ref 12.0–15.0)
LYMPHS ABS: 1.4 10*3/uL (ref 0.7–4.0)
Lymphocytes Relative: 24 %
MCH: 24.3 pg — AB (ref 26.0–34.0)
MCHC: 29.3 g/dL — ABNORMAL LOW (ref 30.0–36.0)
MCV: 82.8 fL (ref 78.0–100.0)
Monocytes Absolute: 0.8 10*3/uL (ref 0.1–1.0)
Monocytes Relative: 13 %
Neutro Abs: 3.7 10*3/uL (ref 1.7–7.7)
Neutrophils Relative %: 62 %
Platelets: 330 10*3/uL (ref 150–400)
RBC: 4.12 MIL/uL (ref 3.87–5.11)
RDW: 16.9 % — ABNORMAL HIGH (ref 11.5–15.5)
WBC: 5.9 10*3/uL (ref 4.0–10.5)

## 2018-02-18 LAB — COMPREHENSIVE METABOLIC PANEL
ALT: 14 U/L (ref 0–44)
AST: 20 U/L (ref 15–41)
Albumin: 3.3 g/dL — ABNORMAL LOW (ref 3.5–5.0)
Alkaline Phosphatase: 78 U/L (ref 38–126)
Anion gap: 8 (ref 5–15)
BUN: 21 mg/dL (ref 8–23)
CO2: 28 mmol/L (ref 22–32)
CREATININE: 0.99 mg/dL (ref 0.44–1.00)
Calcium: 9.3 mg/dL (ref 8.9–10.3)
Chloride: 102 mmol/L (ref 98–111)
GFR calc non Af Amer: 57 mL/min — ABNORMAL LOW (ref >60)
Glucose, Bld: 99 mg/dL (ref 70–99)
Potassium: 4.7 mmol/L (ref 3.5–5.1)
SODIUM: 138 mmol/L (ref 135–145)
TOTAL PROTEIN: 7.4 g/dL (ref 6.5–8.1)
Total Bilirubin: 0.5 mg/dL (ref 0.3–1.2)

## 2018-02-18 MED ORDER — FAMOTIDINE IN NACL 20-0.9 MG/50ML-% IV SOLN
20.0000 mg | Freq: Once | INTRAVENOUS | Status: AC
  Administered 2018-02-18: 20 mg via INTRAVENOUS
  Filled 2018-02-18: qty 50

## 2018-02-18 MED ORDER — SODIUM CHLORIDE 0.9% FLUSH
10.0000 mL | INTRAVENOUS | Status: DC | PRN
  Administered 2018-02-18: 10 mL
  Filled 2018-02-18: qty 10

## 2018-02-18 MED ORDER — DIPHENHYDRAMINE HCL 50 MG/ML IJ SOLN
50.0000 mg | Freq: Once | INTRAMUSCULAR | Status: AC
  Administered 2018-02-18: 50 mg via INTRAVENOUS
  Filled 2018-02-18: qty 1

## 2018-02-18 MED ORDER — SODIUM CHLORIDE 0.9 % IV SOLN
20.0000 mg | Freq: Once | INTRAVENOUS | Status: AC
  Administered 2018-02-18: 20 mg via INTRAVENOUS
  Filled 2018-02-18: qty 2

## 2018-02-18 MED ORDER — SODIUM CHLORIDE 0.9 % IV SOLN
215.8000 mg | Freq: Once | INTRAVENOUS | Status: AC
  Administered 2018-02-18: 220 mg via INTRAVENOUS
  Filled 2018-02-18: qty 22

## 2018-02-18 MED ORDER — PALONOSETRON HCL INJECTION 0.25 MG/5ML
0.2500 mg | Freq: Once | INTRAVENOUS | Status: AC
  Administered 2018-02-18: 0.25 mg via INTRAVENOUS
  Filled 2018-02-18: qty 5

## 2018-02-18 MED ORDER — SODIUM CHLORIDE 0.9 % IV SOLN
45.0000 mg/m2 | Freq: Once | INTRAVENOUS | Status: AC
  Administered 2018-02-18: 96 mg via INTRAVENOUS
  Filled 2018-02-18: qty 16

## 2018-02-18 MED ORDER — SODIUM CHLORIDE 0.9 % IV SOLN
Freq: Once | INTRAVENOUS | Status: AC
  Administered 2018-02-18: 10:00:00 via INTRAVENOUS

## 2018-02-18 MED ORDER — HEPARIN SOD (PORK) LOCK FLUSH 100 UNIT/ML IV SOLN
500.0000 [IU] | Freq: Once | INTRAVENOUS | Status: AC | PRN
  Administered 2018-02-18: 500 [IU]
  Filled 2018-02-18 (×2): qty 5

## 2018-02-18 NOTE — Progress Notes (Signed)
[place], [place] [address]   CLINIC:  Medical Oncology/Hematology  PCP:  Fayrene Helper, MD [address], [address] [place] [address] [phone]   REASON FOR VISIT:  Follow-up for stage III lung cancer  CURRENT THERAPY: Radiation therapy with Carboplatin and Paclitaxel  BRIEF ONCOLOGIC HISTORY:    Squamous cell lung cancer, left ([facility])   10/28/2017 Imaging    CT scan of the chest shows left hilar mass measuring 3.4 x 2.9 cm, multiple soft tissue masses in the left upper lobe, largest measuring 1.6 cm  MRI of the brain on 10/31/2017 shows dural based focus of contrast enhancement along the superior left convexity, meningioma favored as it was present on MRI in 2009      10/30/2017 Initial Biopsy    Bronchoscopy and biopsy consistent with squamous cell cancer of the lung      11/08/2017 Family History    2 sisters died of stomach cancer Father died of lung cancer in a smoker Maternal aunt died of thyroid cancer Maternal grandfather had cancer spread to the bone      02/05/2018 -  Chemotherapy    The patient had palonosetron (ALOXI) injection 0.25 mg, 0.25 mg, Intravenous,  Once, 2 of 4 cycles Administration: 0.25 mg (02/11/2018) CARBOplatin (PARAPLATIN) 220 mg in sodium chloride 0.9 % 250 mL chemo infusion, 220 mg (100 % of original dose 215.8 mg), Intravenous,  Once, 2 of 4 cycles Dose modification:   (original dose 215.8 mg, Cycle 1),   (original dose 215.8 mg, Cycle 2) Administration: 220 mg (02/11/2018) PACLitaxel (TAXOL) 96 mg in sodium chloride 0.9 % 250 mL chemo infusion (</= 23m/m2), 45 mg/m2 = 96 mg, Intravenous,  Once, 2 of 4 cycles Administration: 96 mg (02/11/2018)  for chemotherapy treatment.         CANCER STAGING: Cancer Staging No matching staging information was found for the patient.   INTERVAL HISTORY:  Ms. [age]Scalisi89y.o. female returns for routine follow-up for stage III lung cancer and consideration  for her 2nd cycle of carboplatin and paclitaxel. Patient is here with her husband today. Patient tolerated chemo very well last week. She did get constipated. Patient got relief with the lactulose. Denies numbness or tingling in hand or feet. Her weight is down 7 pounds from her last visit. She continuous to drink boast to keep her weight up. Patient denies any new pains. She does have chronic back pain which is stable at this time. Denies cough or hemoptysis. Denies nausea, vomiting, or diarrhea. Patient energy levels are 75% and she continuous to be able to perform all of her household activities with ease. Overall, she feels ready for next cycle of chemo today.     REVIEW OF SYSTEMS:  Review of Systems  Constitutional: Negative.   HENT:  Negative.   Eyes: Negative.   Respiratory: Negative.   Cardiovascular: Negative.   Gastrointestinal: Positive for constipation.  Endocrine: Negative.   Genitourinary: Negative.    Musculoskeletal: Negative.   Skin: Negative.   Neurological: Negative.   Hematological: Negative.   Psychiatric/Behavioral: Negative.      PAST MEDICAL/SURGICAL HISTORY:  Past Medical History:  Diagnosis Date  . Anxiety   . Arthritis   . Bipolar disorder ([facility]   . Chronic back pain   . COPD (chronic obstructive pulmonary disease) ([facility]   . Fracture of left ankle Jan 06, 2010   hairline/ Dr. KAlfonso Ramusis treating   . GERD (  gastroesophageal reflux disease) 2000  . Hyperlipidemia 1995  . Hypertension 1995  . Nicotine addiction   . Obesity    Past Surgical History:  Procedure Laterality Date  . BACK SURGERY  1999  . BRONCHIAL BRUSHINGS Left 10/30/2017   Procedure: BRONCHIAL BRUSHINGS;  Surgeon: [name], MD;  Location[facility];  Service: Cardiopulmonary;  Laterality: Left;  . BRONCHIAL WASHINGS Left 10/30/2017   Procedure: BRONCHIAL WASHINGS;  Surgeon: [name], MD;  Location: [facility];  Service: Cardiopulmonary;  Laterality: Left;  .  CHOLECYSTECTOMY  2001  . COLONOSCOPY N/A 02/02/2016   Procedure: COLONOSCOPY;  Surgeon: [name], MD;  Location: [facility];  Service: Endoscopy;  Laterality: N/A;  930  . FLEXIBLE BRONCHOSCOPY Bilateral 10/30/2017   Procedure: FLEXIBLE BRONCHOSCOPY;  Surgeon: [name], MD;  Location: [facility];  Service: Cardiopulmonary;  Laterality: Bilateral;  . INCISIONAL HERNIA REPAIR  August 17, 2008  . left inguinal hernia herniorrhapy  1980  . PORTACATH PLACEMENT Left 02/03/2018   Procedure: INSERTION PORT-A-CATH;  Surgeon: [name], MD;  Location: [facility];  Service: General;  Laterality: Left;  . removal of thmoma  03/27/2010   Dr. Arlyce Dice   . SPINE SURGERY    . TONSILLECTOMY    . TOTAL ABDOMINAL HYSTERECTOMY W/ BILATERAL SALPINGOOPHORECTOMY  1984     SOCIAL HISTORY:  Social History   Socioeconomic History  . Marital status: Married    Spouse name: Not on file  . Number of children: Not on file  . Years of education: Not on file  . Highest education level: Not on file  Occupational History  . 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 Financial resource strain: Not hard at all  . Food insecurity:    Worry: Never true    Inability: Never true  . Transportation needs:    Medical: No    Non-medical: No  Tobacco Use  . Smoking status: Former Smoker    Packs/day: 1.50    Years: 54.00    Pack years: 81.00    Types: Cigarettes    Last attempt to quit: 08/20/2017    Years since quitting: 0.4  . Smokeless tobacco: Never Used  Substance and Sexual Activity  . Alcohol use: No    Alcohol/week: 0.0 oz  . Drug use: No  . Sexual activity: Not Currently  Lifestyle  . Physical activity:    Days per week: 0 days    Minutes per session: 0 min  . Stress: Only a little  Relationships  . Social connections:    Talks on phone: More than three times a week    Gets together: More than three times a week    Attends religious  service: More than 4 times per year    Active member of club or organization: No    Attends meetings of clubs or organizations: Never    Relationship status: Married  . Intimate partner violence:    Fear of current or ex partner: No    Emotionally abused: No    Physically abused: No    Forced sexual activity: No  Other Topics Concern  . Not on file  Social History Narrative   Pt had 2 stillborns     FAMILY HISTORY:  Family History  Problem Relation Age of Onset  . Heart disease Mother        enlarged  . Diabetes Mother   . Hypertension Mother   .  Cancer Father        throat  . Hypertension Father   . Coronary artery disease Father   . Diabetes Sister        x3  . Asthma Sister   . Kidney disease Brother   . Stroke Brother     CURRENT MEDICATIONS:  Outpatient Encounter Medications as of 02/18/2018  Medication Sig  . albuterol (PROVENTIL HFA;VENTOLIN HFA) 108 (90 Base) MCG/ACT inhaler INHALE 2 PUFFS BY MOUTH EVERY 6 HOURS IF NEEDED  . albuterol (PROVENTIL) (2.5 MG/3ML) 0.083% nebulizer solution INHALE ONE VIAL VIA NEBULIZER EVERY 6 HOURS AS NEEDED.  Marland Kitchen ALPRAZolam (XANAX) 1 MG tablet Take 1 tablet (1 mg total) by mouth 2 (two) times daily.  Marland Kitchen aspirin (ASPIRIN LOW DOSE) 81 MG EC tablet Take 81 mg by mouth daily.    . celecoxib (CELEBREX) 400 MG capsule TAKE ONE CAPSULE BY MOUTH ONCE DAILY  . diltiazem (CARDIZEM CD) 120 MG 24 hr capsule TAKE 1 TABLET ONCE DAILY  . EPINEPHrine 0.3 mg/0.3 mL IJ SOAJ injection Inject 0.3 mg into the muscle once.  Marland Kitchen FLUoxetine (PROZAC) 20 MG capsule TAKE 1 TABLET ONCE DAILY  . furosemide (LASIX) 20 MG tablet Take 1 tablet (20 mg total) by mouth 2 (two) times daily.  Marland Kitchen guaiFENesin (MUCINEX) 600 MG 12 hr tablet Take 1 tablet (600 mg total) by mouth 2 (two) times daily.  Marland Kitchen HYDROcodone-acetaminophen (NORCO/VICODIN) 5-325 MG tablet One tablet three times daily for back pain  . lactulose (CHRONULAC) 10 GM/15ML solution Take 15-84m at bedtime every night  to assist with moving bowels.  Titrate down if having multiple bowel movements.  . lidocaine-prilocaine (EMLA) cream Apply a quarter size amount to port site 1 hour prior to chemo. Do not rub in. Cover with plastic wrap.  . lisinopril (PRINIVIL,ZESTRIL) 10 MG tablet Take 1 tablet (10 mg total) by mouth daily.  .Marland Kitchenomeprazole (PRILOSEC) 40 MG capsule TAKE 1 CAPSULE ONCE DAILY  . potassium chloride SA (K-DUR,KLOR-CON) 20 MEQ tablet Take 1 tablet (20 mEq total) by mouth daily.  . prochlorperazine (COMPAZINE) 5 MG tablet   . rosuvastatin (CRESTOR) 20 MG tablet Take 1 tablet (20 mg total) by mouth daily.  . SYMBICORT 80-4.5 MCG/ACT inhaler INHALE 2 PUFFS BY MOUTH TWICE DAILY. RINSE MOUTH AFTER USE  . tiZANidine (ZANAFLEX) 4 MG tablet Take 1 tablet (4 mg total) by mouth 3 (three) times daily.  . [DISCONTINUED] HYDROcodone-acetaminophen (NORCO/VICODIN) 5-325 MG tablet One tablet three times daily for  chronic back pain  . [DISCONTINUED] HYDROcodone-acetaminophen (NORCO/VICODIN) 5-325 MG tablet One tablet three times daily for chronic back pain   No facility-administered encounter medications on file as of 02/18/2018.     ALLERGIES:  Allergies  Allergen Reactions  . Bee Venom Anaphylaxis  . Codeine   . Penicillins Other (See Comments)    Blisters in mouth after dental work Has patient had a PCN reaction causing immediate rash, facial/tongue/throat swelling, SOB or lightheadedness with hypotension: Yes Has patient had a PCN reaction causing severe rash involving mucus membranes or skin necrosis: No Has patient had a PCN reaction that required hospitalization No Has patient had a PCN reaction occurring within the last 10 years: Yes If all of the above answers are "NO", then may proceed with Cephalosporin use.      PHYSICAL EXAM:  ECOG Performance status: 1  Vital signs: BP: 111/62, P:87, Resp: 18, TEMP: 97.7, 02: 97%  Physical Exam   LABORATORY DATA:  I have reviewed the labs  as listed.    CBC    Component Value Date/Time   WBC 5.9 02/18/2018 0905   RBC 4.12 02/18/2018 0905   HGB 10.0 (L) 02/18/2018 0905   HCT 34.1 (L) 02/18/2018 0905   PLT 330 02/18/2018 0905   MCV 82.8 02/18/2018 0905   MCH 24.3 (L) 02/18/2018 0905   MCHC 29.3 (L) 02/18/2018 0905   RDW 16.9 (H) 02/18/2018 0905   LYMPHSABS 1.4 02/18/2018 0905   MONOABS 0.8 02/18/2018 0905   EOSABS 0.0 02/18/2018 0905   BASOSABS 0.0 02/18/2018 0905   CMP Latest Ref Rng & Units 02/18/2018 02/11/2018 01/28/2018  Glucose 70 - 99 mg/dL 99 119(H) 98  BUN 8 - 23 mg/dL 21 17 26(H)  Creatinine 0.44 - 1.00 mg/dL 0.99 0.65 0.88  Sodium 135 - 145 mmol/L 138 143 138  Potassium 3.5 - 5.1 mmol/L 4.7 4.4 4.5  Chloride 98 - 111 mmol/L 102 105 102  CO2 22 - 32 mmol/L _0 Calcium 8.9 - 10.3 mg/dL 9.3 9.1 9.0  Total Protein 6.5 - 8.1 g/dL 7.4 7.8 -  Total Bilirubin 0.3 - 1.2 mg/dL 0.5 0.5 -  Alkaline Phos 38 - 126 U/L 78 75 -  AST 15 - 41 U/L 20 14(L) -  ALT 0 - 44 U/L 14 11 -       ASSESSMENT & PLAN:   Squamous cell lung cancer, left ([facility]) 1.  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  She was evaluated by Dr. Roxan Hockey on 01/08/2018 and felt not to be a surgical candidate. - Started on chemoradiation therapy with weekly carboplatin and paclitaxel on 02/11/2018.  Had some problems with constipation which are well controlled with lactulose every night.  Also had some  aches and pains in her legs which lasted couple of days.  Very mild nausea was reported.  Patient thinks it secondary to constipation.  She will proceed with her cycle 2 today.  We have reviewed her blood work.  2.  Family history: The 2 of her sisters died of stomach cancer.  Father died of lung cancer.  Maternal aunt died of thyroid cancer.  Maternal grandfather had cancer in the bone.  Primary is unknown.  We will consider genetic testing.      Orders placed this encounter:  Orders Placed This Encounter  Procedures  . Magnesium  . CBC with Differential/Platelet  . Comprehensive metabolic panel  . Magnesium  . CBC with Differential/Platelet  . Comprehensive metabolic panel      Derek Jack, MD [place] [phone]

## 2018-02-18 NOTE — Assessment & Plan Note (Signed)
1.  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  She was evaluated by Dr. Roxan Hockey on 01/08/2018 and felt not to be a surgical candidate. - Started on chemoradiation therapy with weekly carboplatin and paclitaxel on 02/11/2018.  Had some problems with constipation which are well controlled with lactulose every night.  Also had some aches and pains in her legs which lasted couple of days.  Very mild nausea was reported.  Patient thinks it secondary to constipation.  She will proceed with her cycle 2 today.  We have reviewed her blood work.  2.  Family history: The 2 of her sisters died of stomach cancer.  Father died of lung cancer.  Maternal aunt died of thyroid cancer.  Maternal grandfather had cancer in the bone.  Primary is unknown.  We will consider genetic testing.

## 2018-02-18 NOTE — Patient Instructions (Signed)
[facility] Discharge Instructions for Patients Receiving Chemotherapy   Beginning January 23rd 2017 lab work for the [facility] will be done in the  Main lab at [facility] on 1st floor. If you have a lab appointment with the [facility] please come in thru the  Main Entrance and check in at the main information desk   Today you received the following chemotherapy agents   To help prevent nausea and vomiting after your treatment, we encourage you to take your nausea medication    The clinic phone number is [phone]. Office hours are Monday-Friday 8:30am-5:00pm.  BELOW ARE SYMPTOMS THAT SHOULD BE REPORTED IMMEDIATELY:  *FEVER GREATER THAN 101.0 F  *CHILLS WITH OR WITHOUT FEVER  NAUSEA AND VOMITING THAT IS NOT CONTROLLED WITH YOUR NAUSEA MEDICATION  *UNUSUAL SHORTNESS OF BREATH  *UNUSUAL BRUISING OR BLEEDING  TENDERNESS IN MOUTH AND THROAT WITH OR WITHOUT PRESENCE OF ULCERS  *URINARY PROBLEMS  *BOWEL PROBLEMS  UNUSUAL RASH Items with * indicate a potential emergency and should be followed up as soon as possible. If you have an emergency after office hours please contact your primary care physician or go to the nearest emergency department.  Please call the clinic during office hours if you have any questions or concerns.   You may also contact the Patient Navigator at [phone] should you have any questions or need assistance in obtaining follow up care.      Resources For Cancer Patients and their Caregivers ? American Cancer Society: Can assist with transportation, wigs, general needs, runs Look Good Feel Better.        508-686-0304 ? Cancer Care: Provides financial assistance, online support groups, medication/co-pay assistance.  1-800-813-HOPE 3072959867) ? [facility] Assists [place] cancer patients and their families through emotional , educational and financial support.  445-080-0074 ? [place]  DSS Where to apply for food stamps, Medicaid and utility assistance. 475-628-8885 ? RCATS: Transportation to medical appointments. 218-684-6606 ? Social Security Administration: May apply for disability if have a Stage IV cancer. 310-311-1987 ([phone] ? [employer], Disability and Transit Services: Assists with nutrition, care and transit needs. [phone]

## 2018-02-18 NOTE — Progress Notes (Signed)
Labs reviewed with Dr. Delton Coombes. Proceed with treatment today, cycle 2.  Treatment given per orders. Patient tolerated it well without problems. Vitals stable and discharged home from clinic ambulatory. Follow up as scheduled.

## 2018-02-18 NOTE — Telephone Encounter (Signed)
Please send this to Dr. Moshe Cipro.

## 2018-02-19 ENCOUNTER — Ambulatory Visit (HOSPITAL_COMMUNITY): Payer: Self-pay

## 2018-02-19 ENCOUNTER — Other Ambulatory Visit (HOSPITAL_COMMUNITY): Payer: Self-pay

## 2018-02-19 DIAGNOSIS — C3412 Malignant neoplasm of upper lobe, left bronchus or lung: Secondary | ICD-10-CM | POA: Diagnosis not present

## 2018-02-19 NOTE — Telephone Encounter (Signed)
I just got the message that she had called to check the status lastweek. I'm not sure if she has to pay or if she wants it faxed or pickup, Thanks

## 2018-02-25 ENCOUNTER — Inpatient Hospital Stay (HOSPITAL_COMMUNITY): Payer: PPO

## 2018-02-25 ENCOUNTER — Other Ambulatory Visit: Payer: Self-pay

## 2018-02-25 ENCOUNTER — Inpatient Hospital Stay (HOSPITAL_COMMUNITY): Payer: PPO | Admitting: Dietician

## 2018-02-25 ENCOUNTER — Encounter (HOSPITAL_COMMUNITY): Payer: Self-pay

## 2018-02-25 VITALS — BP 148/68 | HR 75 | Temp 97.7°F | Resp 18 | Wt 220.2 lb

## 2018-02-25 DIAGNOSIS — C3492 Malignant neoplasm of unspecified part of left bronchus or lung: Secondary | ICD-10-CM

## 2018-02-25 DIAGNOSIS — R062 Wheezing: Secondary | ICD-10-CM

## 2018-02-25 DIAGNOSIS — Z5111 Encounter for antineoplastic chemotherapy: Secondary | ICD-10-CM | POA: Diagnosis not present

## 2018-02-25 LAB — COMPREHENSIVE METABOLIC PANEL
ALT: 12 U/L (ref 0–44)
AST: 14 U/L — ABNORMAL LOW (ref 15–41)
Albumin: 3.5 g/dL (ref 3.5–5.0)
Alkaline Phosphatase: 80 U/L (ref 38–126)
Anion gap: 7 (ref 5–15)
BUN: 14 mg/dL (ref 8–23)
CHLORIDE: 104 mmol/L (ref 98–111)
CO2: 26 mmol/L (ref 22–32)
CREATININE: 0.72 mg/dL (ref 0.44–1.00)
Calcium: 8.9 mg/dL (ref 8.9–10.3)
GFR calc non Af Amer: >60 mL/min (ref >60)
Glucose, Bld: 81 mg/dL (ref 70–99)
POTASSIUM: 5 mmol/L (ref 3.5–5.1)
Sodium: 137 mmol/L (ref 135–145)
Total Bilirubin: 0.3 mg/dL (ref 0.3–1.2)
Total Protein: 7.2 g/dL (ref 6.5–8.1)

## 2018-02-25 LAB — CBC WITH DIFFERENTIAL/PLATELET
Basophils Absolute: 0 10*3/uL (ref 0.0–0.1)
Basophils Relative: 0 %
Eosinophils Absolute: 0.1 10*3/uL (ref 0.0–0.7)
Eosinophils Relative: 1 %
HCT: 33.9 % — ABNORMAL LOW (ref 36.0–46.0)
Hemoglobin: 10 g/dL — ABNORMAL LOW (ref 12.0–15.0)
LYMPHS ABS: 1 10*3/uL (ref 0.7–4.0)
LYMPHS PCT: 23 %
MCH: 24.2 pg — AB (ref 26.0–34.0)
MCHC: 29.5 g/dL — ABNORMAL LOW (ref 30.0–36.0)
MCV: 82.1 fL (ref 78.0–100.0)
MONO ABS: 0.5 10*3/uL (ref 0.1–1.0)
Monocytes Relative: 13 %
Neutro Abs: 2.7 10*3/uL (ref 1.7–7.7)
Neutrophils Relative %: 63 %
PLATELETS: 300 10*3/uL (ref 150–400)
RBC: 4.13 MIL/uL (ref 3.87–5.11)
RDW: 17.7 % — AB (ref 11.5–15.5)
WBC: 4.2 10*3/uL (ref 4.0–10.5)

## 2018-02-25 LAB — MAGNESIUM: MAGNESIUM: 1.9 mg/dL (ref 1.7–2.4)

## 2018-02-25 MED ORDER — DIPHENHYDRAMINE HCL 50 MG/ML IJ SOLN
50.0000 mg | Freq: Once | INTRAMUSCULAR | Status: AC
  Administered 2018-02-25: 50 mg via INTRAVENOUS

## 2018-02-25 MED ORDER — SODIUM CHLORIDE 0.9 % IV SOLN
215.8000 mg | Freq: Once | INTRAVENOUS | Status: AC
  Administered 2018-02-25: 220 mg via INTRAVENOUS
  Filled 2018-02-25: qty 22

## 2018-02-25 MED ORDER — DIPHENHYDRAMINE HCL 50 MG/ML IJ SOLN
INTRAMUSCULAR | Status: AC
  Filled 2018-02-25: qty 1

## 2018-02-25 MED ORDER — FAMOTIDINE IN NACL 20-0.9 MG/50ML-% IV SOLN
20.0000 mg | Freq: Once | INTRAVENOUS | Status: AC
  Administered 2018-02-25: 20 mg via INTRAVENOUS

## 2018-02-25 MED ORDER — PALONOSETRON HCL INJECTION 0.25 MG/5ML
INTRAVENOUS | Status: AC
  Filled 2018-02-25: qty 5

## 2018-02-25 MED ORDER — ALBUTEROL SULFATE (2.5 MG/3ML) 0.083% IN NEBU
INHALATION_SOLUTION | RESPIRATORY_TRACT | Status: AC
  Filled 2018-02-25: qty 3

## 2018-02-25 MED ORDER — SODIUM CHLORIDE 0.9 % IV SOLN
20.0000 mg | Freq: Once | INTRAVENOUS | Status: AC
  Administered 2018-02-25: 20 mg via INTRAVENOUS
  Filled 2018-02-25: qty 2

## 2018-02-25 MED ORDER — PALONOSETRON HCL INJECTION 0.25 MG/5ML
0.2500 mg | Freq: Once | INTRAVENOUS | Status: AC
  Administered 2018-02-25: 0.25 mg via INTRAVENOUS

## 2018-02-25 MED ORDER — ALBUTEROL SULFATE (2.5 MG/3ML) 0.083% IN NEBU
2.5000 mg | INHALATION_SOLUTION | Freq: Once | RESPIRATORY_TRACT | Status: AC
  Administered 2018-02-25: 2.5 mg via RESPIRATORY_TRACT

## 2018-02-25 MED ORDER — SODIUM CHLORIDE 0.9 % IV SOLN
Freq: Once | INTRAVENOUS | Status: AC
  Administered 2018-02-25: 10:00:00 via INTRAVENOUS

## 2018-02-25 MED ORDER — FAMOTIDINE IN NACL 20-0.9 MG/50ML-% IV SOLN
INTRAVENOUS | Status: AC
  Filled 2018-02-25: qty 50

## 2018-02-25 MED ORDER — SODIUM CHLORIDE 0.9% FLUSH
10.0000 mL | INTRAVENOUS | Status: DC | PRN
  Administered 2018-02-25: 10 mL
  Filled 2018-02-25: qty 10

## 2018-02-25 MED ORDER — SODIUM CHLORIDE 0.9 % IV SOLN
45.0000 mg/m2 | Freq: Once | INTRAVENOUS | Status: AC
  Administered 2018-02-25: 96 mg via INTRAVENOUS
  Filled 2018-02-25: qty 16

## 2018-02-25 MED ORDER — HEPARIN SOD (PORK) LOCK FLUSH 100 UNIT/ML IV SOLN
500.0000 [IU] | Freq: Once | INTRAVENOUS | Status: AC | PRN
  Administered 2018-02-25: 500 [IU]

## 2018-02-25 NOTE — Progress Notes (Signed)
[name] tolerated chemo tx well without complaints or incident. Labs reviewed prior to administering chemo medications. VSS upon discharge. Pt discharged self ambulatory in satisfactory condition accompanied by her husband

## 2018-02-25 NOTE — Progress Notes (Signed)
Nutrition Assessment  Reason for Assessment: Malnutrition Screening Tool  ASSESSMENT: 72 y/o female PMHx tobacco abuse, Anxiety, COPD, chronic back pain, HTN/HLD, GERD. Recently diagnosed with L squamous cell carcinoma (T3N1).Undergoing chemoradiation.   Screened patient due to MST and apparent weight loss.   Today, patient largely denies any complaints.No n/v/d. She says she is eating 3x a day atleast and has no barriers to intake other than a "scratchy/itchy throat". She did have constipation, but this has resolved.   She has no concerns about her weight, saying her UBW is 218-222, though chart shows she may be down ~5 lbs in the last few months. She wants to lose more weight and says she "watches my sweets".  She has Boost High Protein and Boost Original at home, but only drinks maybe 2 of these a week.   Wt Readings from Last 10 Encounters:  02/25/18 220 lb 3.8 oz (99.9 kg)  02/18/18 218 lb 14.7 oz (99.3 kg)  02/11/18 225 lb (102.1 kg)  02/06/18 222 lb (100.7 kg)  01/28/18 220 lb (99.8 kg)  01/28/18 220 lb (99.8 kg)  01/21/18 218 lb 1.6 oz (98.9 kg)  01/08/18 230 lb (104.3 kg)  11/18/17 224 lb 3.2 oz (101.7 kg)  11/08/17 223 lb 6.4 oz (101.3 kg)   MEDICATIONS:  Chemo: Taxol Pertinent meds: Xanax, lasix, lactulose, hydrocodone, KCL, compazine, prilosec,   LABS: Largely WDL, nothing notable.  Recent Labs  Lab 02/25/18 0845  NA 137  K 5.0  CL 104  CO2 26  BUN 14  CREATININE 0.72  CALCIUM 8.9  MG 1.9  GLUCOSE 81    ANTHROPOMETRICS: Height:  Ht Readings from Last 1 Encounters:  02/06/18 5\' 3"  (1.6 m)   Weight:  Wt Readings from Last 1 Encounters:  02/25/18 220 lb 3.8 oz (99.9 kg)   BMI:  BMI Readings from Last 1 Encounters:  02/25/18 39.01 kg/m   UBW: 218-222 lbs - per pt IBW: 52.27 kg  ESTIMATED ENERGY NEEDS:  Kcal: 1550-1850 (30-35 kcal/kg ibw) Protein:80-90g Pro (20% protein needs) Fluid: >1.9 L (35 ml/kg ibw)  NUTRITION - FOCUSED PHYSICAL  EXAM: Nutrition Assessment and Review - 02/25/18 1051      Subcutaneous Fat   Orbital Region  No depletion    Upper Arm Region  No depletion    Thoracic and Lumbar Region  No depletion    Buccal Region  No depletion      Muscle   Temple Region  No depletion    Clavicle Bone Region  No depletion    Clavicle and Acromion Bone Region  No depletion    Scapular Bone Region  No depletion    Dorsal Hand  No depletion    Patellar Region  No depletion    Anterior Thigh Region  No depletion    Posterior Calf Region  No depletion      Edema RD   Edema (RD Assessment)  None      NUTRITION DIAGNOSIS:  Increased nutrient needs related to cancer and cancer related treatments as evidenced by estimated nutritional requirements for this condition  DOCUMENTATION CODES:  Obesity unspecified  INTERVENTION:  Patient reports no nutritional concerns. She says both her intake and weight are stable. She has no barriers to intake and wants to lose weight.   RD spent a little time reviewing why weight loss during cancer tx is not advised. Encouraged frequent consumption of high protein items throughout the day.   She is told to ask to speak with  dietitian if any nutritional barriers or concerns arise.   GOAL:  Patient will meet greater than or equal to 90% of their needs  MONITOR:  PO intake, Weight trends, Labs  Next Visit: As requested  Burtis Junes RD, LDN, CNSC Clinical Nutrition Available Tues-Sat via Pager: [phone] 02/25/2018 10:56 AM

## 2018-02-25 NOTE — Patient Instructions (Signed)
[facility] Discharge Instructions for Patients Receiving Chemotherapy   Beginning January 23rd 2017 lab work for the Cancer Center will be done in the  Main lab at [place] on 1st floor. If you have a lab appointment with the Cancer Center please come in thru the  Main Entrance and check in at the main information desk   Today you received the following chemotherapy agents Taxol and Carboplatin. Follow-up as scheduled. Call clinic for any questions or concerns  To help prevent nausea and vomiting after your treatment, we encourage you to take your nausea medication.   If you develop nausea and vomiting, or diarrhea that is not controlled by your medication, call the clinic.  The clinic phone number is [phone]. Office hours are Monday-Friday 8:30am-5:00pm.  BELOW ARE SYMPTOMS THAT SHOULD BE REPORTED IMMEDIATELY:  *FEVER GREATER THAN 101.0 F  *CHILLS WITH OR WITHOUT FEVER  NAUSEA AND VOMITING THAT IS NOT CONTROLLED WITH YOUR NAUSEA MEDICATION  *UNUSUAL SHORTNESS OF BREATH  *UNUSUAL BRUISING OR BLEEDING  TENDERNESS IN MOUTH AND THROAT WITH OR WITHOUT PRESENCE OF ULCERS  *URINARY PROBLEMS  *BOWEL PROBLEMS  UNUSUAL RASH Items with * indicate a potential emergency and should be followed up as soon as possible. If you have an emergency after office hours please contact your primary care physician or go to the nearest emergency department.  Please call the clinic during office hours if you have any questions or concerns.   You may also contact the Patient Navigator at [phone] should you have any questions or need assistance in obtaining follow up care.      Resources For Cancer Patients and their Caregivers ? American Cancer Society: Can assist with transportation, wigs, general needs, runs Look Good Feel Better.        1-888-227-6333 ? Cancer Care: Provides financial assistance, online support groups, medication/co-pay assistance.   1-800-813-HOPE (4673) ? [facility] Assists [place] cancer patients and their families through emotional , educational and financial support.  336-427-4357 ? [place] DSS Where to apply for food stamps, Medicaid and utility assistance. 336-342-1394 ? RCATS: Transportation to medical appointments. 336-347-2287 ? Social Security Administration: May apply for disability if have a Stage IV cancer. 336-342-7796 1-800-772-1213 ? [place] Aging, Disability and Transit Services: Assists with nutrition, care and transit needs. 336-349-2343         

## 2018-02-26 DIAGNOSIS — C3412 Malignant neoplasm of upper lobe, left bronchus or lung: Secondary | ICD-10-CM | POA: Diagnosis not present

## 2018-03-04 ENCOUNTER — Encounter (HOSPITAL_COMMUNITY): Payer: Self-pay | Admitting: Hematology

## 2018-03-04 ENCOUNTER — Other Ambulatory Visit (HOSPITAL_COMMUNITY): Payer: Self-pay

## 2018-03-04 ENCOUNTER — Inpatient Hospital Stay (HOSPITAL_COMMUNITY): Payer: PPO

## 2018-03-04 ENCOUNTER — Inpatient Hospital Stay (HOSPITAL_BASED_OUTPATIENT_CLINIC_OR_DEPARTMENT_OTHER): Payer: PPO | Admitting: Hematology

## 2018-03-04 VITALS — BP 161/73 | HR 95 | Temp 98.0°F | Resp 18

## 2018-03-04 VITALS — BP 164/91 | HR 108 | Temp 98.4°F | Resp 20 | Wt 225.2 lb

## 2018-03-04 DIAGNOSIS — Z5111 Encounter for antineoplastic chemotherapy: Secondary | ICD-10-CM | POA: Diagnosis not present

## 2018-03-04 DIAGNOSIS — G8929 Other chronic pain: Secondary | ICD-10-CM

## 2018-03-04 DIAGNOSIS — K5900 Constipation, unspecified: Secondary | ICD-10-CM

## 2018-03-04 DIAGNOSIS — E669 Obesity, unspecified: Secondary | ICD-10-CM

## 2018-03-04 DIAGNOSIS — M129 Arthropathy, unspecified: Secondary | ICD-10-CM | POA: Diagnosis not present

## 2018-03-04 DIAGNOSIS — C3402 Malignant neoplasm of left main bronchus: Secondary | ICD-10-CM

## 2018-03-04 DIAGNOSIS — F319 Bipolar disorder, unspecified: Secondary | ICD-10-CM

## 2018-03-04 DIAGNOSIS — R5383 Other fatigue: Secondary | ICD-10-CM | POA: Diagnosis not present

## 2018-03-04 DIAGNOSIS — R634 Abnormal weight loss: Secondary | ICD-10-CM

## 2018-03-04 DIAGNOSIS — E785 Hyperlipidemia, unspecified: Secondary | ICD-10-CM

## 2018-03-04 DIAGNOSIS — J449 Chronic obstructive pulmonary disease, unspecified: Secondary | ICD-10-CM

## 2018-03-04 DIAGNOSIS — Z923 Personal history of irradiation: Secondary | ICD-10-CM

## 2018-03-04 DIAGNOSIS — M549 Dorsalgia, unspecified: Secondary | ICD-10-CM

## 2018-03-04 DIAGNOSIS — F17210 Nicotine dependence, cigarettes, uncomplicated: Secondary | ICD-10-CM

## 2018-03-04 DIAGNOSIS — C3492 Malignant neoplasm of unspecified part of left bronchus or lung: Secondary | ICD-10-CM

## 2018-03-04 DIAGNOSIS — K219 Gastro-esophageal reflux disease without esophagitis: Secondary | ICD-10-CM

## 2018-03-04 DIAGNOSIS — B370 Candidal stomatitis: Secondary | ICD-10-CM

## 2018-03-04 DIAGNOSIS — F419 Anxiety disorder, unspecified: Secondary | ICD-10-CM | POA: Diagnosis not present

## 2018-03-04 DIAGNOSIS — Z803 Family history of malignant neoplasm of breast: Secondary | ICD-10-CM

## 2018-03-04 DIAGNOSIS — Z808 Family history of malignant neoplasm of other organs or systems: Secondary | ICD-10-CM

## 2018-03-04 DIAGNOSIS — Z801 Family history of malignant neoplasm of trachea, bronchus and lung: Secondary | ICD-10-CM

## 2018-03-04 DIAGNOSIS — Z79899 Other long term (current) drug therapy: Secondary | ICD-10-CM

## 2018-03-04 DIAGNOSIS — I10 Essential (primary) hypertension: Secondary | ICD-10-CM

## 2018-03-04 DIAGNOSIS — Z87891 Personal history of nicotine dependence: Secondary | ICD-10-CM

## 2018-03-04 LAB — COMPREHENSIVE METABOLIC PANEL
ALT: 14 U/L (ref 0–44)
AST: 16 U/L (ref 15–41)
Albumin: 3.5 g/dL (ref 3.5–5.0)
Alkaline Phosphatase: 77 U/L (ref 38–126)
Anion gap: 7 (ref 5–15)
BUN: 17 mg/dL (ref 8–23)
CHLORIDE: 105 mmol/L (ref 98–111)
CO2: 28 mmol/L (ref 22–32)
CREATININE: 0.65 mg/dL (ref 0.44–1.00)
Calcium: 9.3 mg/dL (ref 8.9–10.3)
GFR calc Af Amer: >60 mL/min (ref >60)
GFR calc non Af Amer: >60 mL/min (ref >60)
Glucose, Bld: 134 mg/dL — ABNORMAL HIGH (ref 70–99)
Potassium: 4.5 mmol/L (ref 3.5–5.1)
Sodium: 140 mmol/L (ref 135–145)
Total Bilirubin: 0.4 mg/dL (ref 0.3–1.2)
Total Protein: 7.1 g/dL (ref 6.5–8.1)

## 2018-03-04 LAB — CBC WITH DIFFERENTIAL/PLATELET
Basophils Absolute: 0 10*3/uL (ref 0.0–0.1)
Basophils Relative: 1 %
EOS PCT: 0 %
Eosinophils Absolute: 0 10*3/uL (ref 0.0–0.7)
HCT: 33.6 % — ABNORMAL LOW (ref 36.0–46.0)
HEMOGLOBIN: 10 g/dL — AB (ref 12.0–15.0)
LYMPHS PCT: 8 %
Lymphs Abs: 0.4 10*3/uL — ABNORMAL LOW (ref 0.7–4.0)
MCH: 24.9 pg — ABNORMAL LOW (ref 26.0–34.0)
MCHC: 29.8 g/dL — ABNORMAL LOW (ref 30.0–36.0)
MCV: 83.6 fL (ref 78.0–100.0)
Monocytes Absolute: 0.5 10*3/uL (ref 0.1–1.0)
Monocytes Relative: 11 %
NEUTROS PCT: 80 %
Neutro Abs: 3.6 10*3/uL (ref 1.7–7.7)
PLATELETS: 234 10*3/uL (ref 150–400)
RBC: 4.02 MIL/uL (ref 3.87–5.11)
RDW: 19 % — ABNORMAL HIGH (ref 11.5–15.5)
WBC: 4.4 10*3/uL (ref 4.0–10.5)

## 2018-03-04 LAB — MAGNESIUM: MAGNESIUM: 1.6 mg/dL — AB (ref 1.7–2.4)

## 2018-03-04 MED ORDER — SODIUM CHLORIDE 0.9 % IV SOLN
Freq: Once | INTRAVENOUS | Status: AC
Start: 2018-03-04 — End: 2018-03-04
  Administered 2018-03-04: 11:00:00 via INTRAVENOUS

## 2018-03-04 MED ORDER — PALONOSETRON HCL INJECTION 0.25 MG/5ML
0.2500 mg | Freq: Once | INTRAVENOUS | Status: AC
  Administered 2018-03-04: 0.25 mg via INTRAVENOUS
  Filled 2018-03-04: qty 5

## 2018-03-04 MED ORDER — SODIUM CHLORIDE 0.9 % IV SOLN
215.8000 mg | Freq: Once | INTRAVENOUS | Status: AC
  Administered 2018-03-04: 220 mg via INTRAVENOUS
  Filled 2018-03-04: qty 22

## 2018-03-04 MED ORDER — SODIUM CHLORIDE 0.9 % IV SOLN
20.0000 mg | Freq: Once | INTRAVENOUS | Status: AC
  Administered 2018-03-04: 20 mg via INTRAVENOUS
  Filled 2018-03-04: qty 2

## 2018-03-04 MED ORDER — DIPHENHYDRAMINE HCL 50 MG/ML IJ SOLN
50.0000 mg | Freq: Once | INTRAMUSCULAR | Status: AC
  Administered 2018-03-04: 50 mg via INTRAVENOUS
  Filled 2018-03-04: qty 1

## 2018-03-04 MED ORDER — SODIUM CHLORIDE 0.9 % IV SOLN
45.0000 mg/m2 | Freq: Once | INTRAVENOUS | Status: AC
  Administered 2018-03-04: 96 mg via INTRAVENOUS
  Filled 2018-03-04: qty 16

## 2018-03-04 MED ORDER — FAMOTIDINE IN NACL 20-0.9 MG/50ML-% IV SOLN
20.0000 mg | Freq: Once | INTRAVENOUS | Status: AC
  Administered 2018-03-04: 20 mg via INTRAVENOUS
  Filled 2018-03-04: qty 50

## 2018-03-04 MED ORDER — SODIUM CHLORIDE 0.9% FLUSH
10.0000 mL | INTRAVENOUS | Status: DC | PRN
  Administered 2018-03-04: 10 mL
  Filled 2018-03-04: qty 10

## 2018-03-04 MED ORDER — HEPARIN SOD (PORK) LOCK FLUSH 100 UNIT/ML IV SOLN
500.0000 [IU] | Freq: Once | INTRAVENOUS | Status: AC | PRN
  Administered 2018-03-04: 500 [IU]
  Filled 2018-03-04: qty 5

## 2018-03-04 NOTE — Assessment & Plan Note (Signed)
1.  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  She was evaluated by Dr. Roxan Hockey on 01/08/2018 and felt not to be a surgical candidate. - Started on chemoradiation therapy with weekly carboplatin and paclitaxel on 02/11/2018.  Today her blood counts are adequate to proceed with week 4 of carboplatin and paclitaxel.  She will come back in 1 week for follow-up and week 5 of treatment.  Her radiation therapy reportedly in some 03/25/2018. -She developed retrosternal pain when she swallows.  She was started on Magic mouthwash by her radiation oncologist.  She is using it prior to eating and it is helping with the pain.  2.  Family history: The 2 of her sisters died of stomach cancer.  Father died of lung cancer.  Maternal aunt died of thyroid cancer.  Maternal grandfather had cancer in the bone.  Primary is unknown.  We will consider genetic testing.

## 2018-03-04 NOTE — Patient Instructions (Signed)
[place] at [facility]  Discharge Instructions:  Today you received your Taxol and Carboplatin infusions. Follow up as scheduled. Call the clinic for any questions or concerns.  _______________________________________________________________  Thank you for choosing [place] at [facility] to provide your oncology and hematology care.  To afford each patient quality time with our providers, please arrive at least 15 minutes before your scheduled appointment.  You need to re-schedule your appointment if you arrive 10 or more minutes late.  We strive to give you quality time with our providers, and arriving late affects you and other patients whose appointments are after yours.  Also, if you no show three or more times for appointments you may be dismissed from the clinic.  Again, thank you for choosing [facility] [place] hope is that these requests will allow you access to exceptional care and in a timely manner. _______________________________________________________________  If you have questions after your visit, please contact our office at [phone] between the hours of 8:30 a.m. and 5:00 p.m. Voicemails left after 4:30 p.m. will not be returned until the following business day. _______________________________________________________________  For prescription refill requests, have your pharmacy contact our office. _______________________________________________________________  Recommendations made by the consultant and any test results will be sent to your referring physician. _______________________________________________________________

## 2018-03-04 NOTE — Progress Notes (Signed)
[place] reviewed and patient seen by Dr. Delton Coombes who approved patient for treatment today. [name] tolerated Taxol/Carbo treatment without incident or complaints. VSS upon completion of treatment. Discharged self ambulatory in satisfactory condition accompanied by husband.

## 2018-03-04 NOTE — Patient Instructions (Addendum)
[place] at [facility] Discharge Instructions  We will see you back in 1 week with labs and treatment. You will be set up with a Genetic counselor appointment.  You saw Dr. Delton Coombes today.    Thank you for choosing [place] at [facility] to provide your oncology and hematology care.  To afford each patient quality time with our provider, please arrive at least 15 minutes before your scheduled appointment time.   If you have a lab appointment with the [facility] please come in thru the  Main Entrance and check in at the main information desk  You need to re-schedule your appointment should you arrive 10 or more minutes late.  We strive to give you quality time with our providers, and arriving late affects you and other patients whose appointments are after yours.  Also, if you no show three or more times for appointments you may be dismissed from the clinic at the providers discretion.     Again, thank you for choosing [facility].  Our hope is that these requests will decrease the amount of time that you wait before being seen by our physicians.       _____________________________________________________________  Should you have questions after your visit to [facility], please contact our office at [phone] between the hours of 8:30 a.m. and 4:30 p.m.  Voicemails left after 4:30 p.m. will not be returned until the following business day.  For prescription refill requests, have your pharmacy contact our office.       Resources For Cancer Patients and their Caregivers ? American Cancer Society: Can assist with transportation, wigs, general needs, runs Look Good Feel Better.        (805) 883-0509 ? Cancer Care: Provides financial assistance, online support groups, medication/co-pay assistance.  1-800-813-HOPE 4696567360) ? [place] Assists [place] cancer patients and their  families through emotional , educational and financial support.  815 601 5201 ? [place] DSS Where to apply for food stamps, Medicaid and utility assistance. (747)104-1192 ? [facility]: Transportation to medical appointments. 978 544 3794 ? Social Security Administration: May apply for disability if have a Stage IV cancer. 915-751-6102 956-104-0012 ? [employer], Disability and Transit Services: Assists with nutrition, care and transit needs. [place] Support Programs:   > Cancer Support Group  2nd Tuesday of the month 1pm-2pm, Journey Room   > Creative Journey  3rd Tuesday of the month 1130am-1pm, Journey Room

## 2018-03-04 NOTE — Progress Notes (Signed)
[place] [address] [place], [place] [address]   CLINIC:  Medical Oncology/Hematology  PCP:  Fayrene Helper, MD [address], [address] [place] [address] [phone]   REASON FOR VISIT:  Follow-up for stage III lung cancer  CURRENT THERAPY: Chemoradiation with carboplatin and paclitaxel  BRIEF ONCOLOGIC HISTORY:    Squamous cell lung cancer, left ([place])   10/28/2017 Imaging    CT scan of the chest shows left hilar mass measuring 3.4 x 2.9 cm, multiple soft tissue masses in the left upper lobe, largest measuring 1.6 cm  MRI of the brain on 10/31/2017 shows dural based focus of contrast enhancement along the superior left convexity, meningioma favored as it was present on MRI in 2009      10/30/2017 Initial Biopsy    Bronchoscopy and biopsy consistent with squamous cell cancer of the lung      11/08/2017 Family History    2 sisters died of stomach cancer Father died of lung cancer in a smoker Maternal aunt died of thyroid cancer Maternal grandfather had cancer spread to the bone      02/05/2018 -  Chemotherapy    The patient had palonosetron (ALOXI) injection 0.25 mg, 0.25 mg, Intravenous,  Once, 4 of 6 cycles Administration: 0.25 mg (02/11/2018), 0.25 mg (02/18/2018), 0.25 mg (02/25/2018) CARBOplatin (PARAPLATIN) 220 mg in sodium chloride 0.9 % 250 mL chemo infusion, 220 mg (100 % of original dose 215.8 mg), Intravenous,  Once, 4 of 6 cycles Dose modification:   (original dose 215.8 mg, Cycle 1),   (original dose 215.8 mg, Cycle 2), 215.8 mg (original dose 215.8 mg, Cycle 3),   (original dose 215.8 mg, Cycle 4) Administration: 220 mg (02/11/2018), 220 mg (02/18/2018), 220 mg (02/25/2018) PACLitaxel (TAXOL) 96 mg in sodium chloride 0.9 % 250 mL chemo infusion (</= 48m/m2), 45 mg/m2 = 96 mg, Intravenous,  Once, 4 of 6 cycles Administration: 96 mg (02/11/2018), 96 mg (02/18/2018), 96 mg (02/25/2018)  for chemotherapy treatment.          INTERVAL HISTORY:    Ms. [id number]. female returns for routine follow-up for stage III lung cancer and consideration for her 4th cycle of chemotherapy. Patient is here today with her husband. She is doing very well with treatment. She report fatigue with treatments. Patient was prescribed nystatin mouht rinse for thrush. Patient has been maintaining her appetite and her weight is stable. Patient has been taking a stool softener for her constipation. Denies any nausea, vomiting, or diarrhea. Denies any new pains. Denies SOB or chest pain. Her energy levels and appetite remain good at 75%.     REVIEW OF SYSTEMS:  Review of Systems  Constitutional: Positive for fatigue.  HENT:   Positive for trouble swallowing.   Eyes: Negative.   Respiratory: Negative.   Cardiovascular: Negative.   Gastrointestinal: Positive for constipation.  Endocrine: Negative.   Genitourinary: Negative.    Musculoskeletal: Negative.   Skin: Negative.   Neurological: Negative.   Hematological: Negative.   Psychiatric/Behavioral: Negative.      PAST MEDICAL/SURGICAL HISTORY:  Past Medical History:  Diagnosis Date  . Anxiety   . Arthritis   . Bipolar disorder ([place]   . Chronic back pain   . COPD (chronic obstructive pulmonary disease) ([facility]   . Fracture of left ankle Jan 06, 2010   hairline/ Dr. KAlfonso Ramusis treating   . GERD (gastroesophageal reflux disease) 2000  . Hyperlipidemia 1995  . Hypertension 1995  .  Nicotine addiction   . Obesity    Past Surgical History:  Procedure Laterality Date  . BACK SURGERY  1999  . BRONCHIAL BRUSHINGS Left 10/30/2017   Procedure: BRONCHIAL BRUSHINGS;  Surgeon: [name], MD;  Location: [facility];  Service: Cardiopulmonary;  Laterality: Left;  . BRONCHIAL WASHINGS Left 10/30/2017   Procedure: BRONCHIAL WASHINGS;  Surgeon: [name], MD;  Location: [facility];  Service: Cardiopulmonary;  Laterality: Left;  . CHOLECYSTECTOMY  2001  . COLONOSCOPY N/A 02/02/2016   Procedure:  COLONOSCOPY;  Surgeon: [name], MD;  Location: [facility];  Service: Endoscopy;  Laterality: N/A;  930  . FLEXIBLE BRONCHOSCOPY Bilateral 10/30/2017   Procedure: FLEXIBLE BRONCHOSCOPY;  Surgeon: [name], MD;  Location: [facility];  Service: Cardiopulmonary;  Laterality: Bilateral;  . INCISIONAL HERNIA REPAIR  August 17, 2008  . left inguinal hernia herniorrhapy  1980  . PORTACATH PLACEMENT Left 02/03/2018   Procedure: INSERTION PORT-A-CATH;  Surgeon: [name], MD;  Location: [facility] ORS;  Service: General;  Laterality: Left;  . removal of thmoma  03/27/2010   Dr. Arlyce Dice   . SPINE SURGERY    . TONSILLECTOMY    . TOTAL ABDOMINAL HYSTERECTOMY W/ BILATERAL SALPINGOOPHORECTOMY  1984     SOCIAL HISTORY:  Social History   Socioeconomic History  . Marital status: Married    Spouse name: Not on file  . Number of children: Not on file  . Years of education: Not on file  . Highest education level: Not on file  Occupational History  . 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 Financial resource strain: Not hard at all  . Food insecurity:    Worry: Never true    Inability: Never true  . Transportation needs:    Medical: No    Non-medical: No  Tobacco Use  . Smoking status: Former Smoker    Packs/day: 1.50    Years: 54.00    Pack years: 81.00    Types: Cigarettes    Last attempt to quit: 08/20/2017    Years since quitting: 0.5  . Smokeless tobacco: Never Used  Substance and Sexual Activity  . Alcohol use: No    Alcohol/week: 0.0 oz  . Drug use: No  . Sexual activity: Not Currently  Lifestyle  . Physical activity:    Days per week: 0 days    Minutes per session: 0 min  . Stress: Only a little  Relationships  . Social connections:    Talks on phone: More than three times a week    Gets together: More than three times a week    Attends religious service: More than 4 times per year    Active member of club or  organization: No    Attends meetings of clubs or organizations: Never    Relationship status: Married  . Intimate partner violence:    Fear of current or ex partner: No    Emotionally abused: No    Physically abused: No    Forced sexual activity: No  Other Topics Concern  . Not on file  Social History Narrative   Pt had 2 stillborns     FAMILY HISTORY:  Family History  Problem Relation Age of Onset  . Heart disease Mother        enlarged  . Diabetes Mother   . Hypertension Mother   . Cancer Father        throat  .  Hypertension Father   . Coronary artery disease Father   . Diabetes Sister        x3  . Asthma Sister   . Kidney disease Brother   . Stroke Brother     CURRENT MEDICATIONS:  Outpatient Encounter Medications as of 03/04/2018  Medication Sig  . albuterol (PROVENTIL HFA;VENTOLIN HFA) 108 (90 Base) MCG/ACT inhaler INHALE 2 PUFFS BY MOUTH EVERY 6 HOURS IF NEEDED  . albuterol (PROVENTIL) (2.5 MG/3ML) 0.083% nebulizer solution INHALE ONE VIAL VIA NEBULIZER EVERY 6 HOURS AS NEEDED.  Marland Kitchen ALPRAZolam (XANAX) 1 MG tablet Take 1 tablet (1 mg total) by mouth 2 (two) times daily.  Marland Kitchen aspirin (ASPIRIN LOW DOSE) 81 MG EC tablet Take 81 mg by mouth daily.    . celecoxib (CELEBREX) 400 MG capsule TAKE ONE CAPSULE BY MOUTH ONCE DAILY  . diltiazem (CARDIZEM CD) 120 MG 24 hr capsule TAKE 1 TABLET ONCE DAILY  . docusate sodium (COLACE) 100 MG capsule Take 200 mg by mouth 2 (two) times daily.  Marland Kitchen EPINEPHrine 0.3 mg/0.3 mL IJ SOAJ injection Inject 0.3 mg into the muscle once.  Marland Kitchen FLUoxetine (PROZAC) 20 MG capsule TAKE 1 TABLET ONCE DAILY  . furosemide (LASIX) 20 MG tablet Take 1 tablet (20 mg total) by mouth 2 (two) times daily.  Marland Kitchen guaiFENesin (MUCINEX) 600 MG 12 hr tablet Take 1 tablet (600 mg total) by mouth 2 (two) times daily.  Marland Kitchen HYDROcodone-acetaminophen (NORCO/VICODIN) 5-325 MG tablet One tablet three times daily for back pain  . lactulose (CHRONULAC) 10 GM/15ML solution Take 15-37m  at bedtime every night to assist with moving bowels.  Titrate down if having multiple bowel movements.  . lidocaine-prilocaine (EMLA) cream Apply a quarter size amount to port site 1 hour prior to chemo. Do not rub in. Cover with plastic wrap.  . lisinopril (PRINIVIL,ZESTRIL) 10 MG tablet Take 1 tablet (10 mg total) by mouth daily.  .Marland Kitchenomeprazole (PRILOSEC) 40 MG capsule TAKE 1 CAPSULE ONCE DAILY  . potassium chloride SA (K-DUR,KLOR-CON) 20 MEQ tablet Take 1 tablet (20 mEq total) by mouth daily.  . prochlorperazine (COMPAZINE) 10 MG tablet Take 10 mg by mouth every 6 (six) hours as needed for nausea or vomiting.  . rosuvastatin (CRESTOR) 20 MG tablet Take 1 tablet (20 mg total) by mouth daily.  . SYMBICORT 80-4.5 MCG/ACT inhaler INHALE 2 PUFFS BY MOUTH TWICE DAILY. RINSE MOUTH AFTER USE  . tiZANidine (ZANAFLEX) 4 MG tablet Take 1 tablet (4 mg total) by mouth 3 (three) times daily.   No facility-administered encounter medications on file as of 03/04/2018.     ALLERGIES:  Allergies  Allergen Reactions  . Bee Venom Anaphylaxis  . Codeine   . Penicillins Other (See Comments)    Blisters in mouth after dental work Has patient had a PCN reaction causing immediate rash, facial/tongue/throat swelling, SOB or lightheadedness with hypotension: Yes Has patient had a PCN reaction causing severe rash involving mucus membranes or skin necrosis: No Has patient had a PCN reaction that required hospitalization No Has patient had a PCN reaction occurring within the last 10 years: Yes If all of the above answers are "NO", then may proceed with Cephalosporin use.      PHYSICAL EXAM:  ECOG Performance status: 1  Vitals:   03/04/18 1027  BP: (!) 164/91  Pulse: (!) 108  Resp: 20  Temp: 98.4 F (36.9 C)  SpO2: 100%   Filed Weights   03/04/18 1027  Weight: 225 lb 3.2 oz (102.2  kg)    Physical Exam   LABORATORY DATA:  I have reviewed the labs as listed.  CBC    Component Value Date/Time    WBC 4.4 03/04/2018 0954   RBC 4.02 03/04/2018 0954   HGB 10.0 (L) 03/04/2018 0954   HCT 33.6 (L) 03/04/2018 0954   PLT 234 03/04/2018 0954   MCV 83.6 03/04/2018 0954   MCH 24.9 (L) 03/04/2018 0954   MCHC 29.8 (L) 03/04/2018 0954   RDW 19.0 (H) 03/04/2018 0954   LYMPHSABS 0.4 (L) 03/04/2018 0954   MONOABS 0.5 03/04/2018 0954   EOSABS 0.0 03/04/2018 0954   BASOSABS 0.0 03/04/2018 0954   CMP Latest Ref Rng & Units 03/04/2018 02/25/2018 02/18/2018  Glucose 70 - 99 mg/dL 134(H) 81 99  BUN 8 - 23 mg/dL _0 Creatinine 0.44 - 1.00 mg/dL 0.65 0.72 0.99  Sodium 135 - 145 mmol/L 140 137 138  Potassium 3.5 - 5.1 mmol/L 4.5 5.0 4.7  Chloride 98 - 111 mmol/L 105 104 102  CO2 22 - 32 mmol/L _1 Calcium 8.9 - 10.3 mg/dL 9.3 8.9 9.3  Total Protein 6.5 - 8.1 g/dL 7.1 7.2 7.4  Total Bilirubin 0.3 - 1.2 mg/dL 0.4 0.3 0.5  Alkaline Phos 38 - 126 U/L 77 80 78  AST 15 - 41 U/L 16 14(L) 20  ALT 0 - 44 U/L _2 ASSESSMENT & PLAN:   Squamous cell lung cancer, left ([facility]) 1.  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  She was evaluated by Dr. Roxan Hockey on 01/08/2018 and felt not to be a surgical candidate. - Started on chemoradiation therapy with weekly carboplatin and paclitaxel on 02/11/2018.  Today her blood counts are adequate to proceed with week 4 of carboplatin and paclitaxel.  She will come back in 1 week for follow-up and  week 5 of treatment.  Her radiation therapy reportedly in some 03/25/2018. -She developed retrosternal pain when she swallows.  She was started on Magic mouthwash by her radiation oncologist.  She is using it prior to eating and it is helping with the pain.  2.  Family history: The 2 of her sisters died of stomach cancer.  Father died of lung cancer.  Maternal aunt died of thyroid cancer.  Maternal grandfather had cancer in the bone.  Primary is unknown.  We will consider genetic testing.      Orders placed this encounter:  Orders Placed This Encounter  Procedures  . CBC with Differential/Platelet  . Comprehensive metabolic panel      Derek Jack, MD [place] [phone]

## 2018-03-05 ENCOUNTER — Other Ambulatory Visit: Payer: Self-pay | Admitting: Family Medicine

## 2018-03-05 ENCOUNTER — Other Ambulatory Visit (HOSPITAL_COMMUNITY): Payer: Self-pay | Admitting: *Deleted

## 2018-03-05 DIAGNOSIS — C3412 Malignant neoplasm of upper lobe, left bronchus or lung: Secondary | ICD-10-CM | POA: Diagnosis not present

## 2018-03-05 DIAGNOSIS — K5900 Constipation, unspecified: Secondary | ICD-10-CM

## 2018-03-05 DIAGNOSIS — C3492 Malignant neoplasm of unspecified part of left bronchus or lung: Secondary | ICD-10-CM

## 2018-03-05 MED ORDER — LACTULOSE 10 GM/15ML PO SOLN: ORAL | 3 refills | Status: AC

## 2018-03-12 ENCOUNTER — Other Ambulatory Visit: Payer: Self-pay

## 2018-03-12 ENCOUNTER — Encounter (HOSPITAL_COMMUNITY): Payer: Self-pay | Admitting: Hematology

## 2018-03-12 ENCOUNTER — Inpatient Hospital Stay (HOSPITAL_COMMUNITY): Payer: PPO

## 2018-03-12 ENCOUNTER — Inpatient Hospital Stay (HOSPITAL_BASED_OUTPATIENT_CLINIC_OR_DEPARTMENT_OTHER): Payer: PPO | Admitting: Hematology

## 2018-03-12 VITALS — BP 152/75 | HR 117 | Temp 98.1°F | Resp 16 | Wt 219.3 lb

## 2018-03-12 VITALS — BP 157/71 | HR 102 | Temp 98.5°F | Resp 18

## 2018-03-12 DIAGNOSIS — Z809 Family history of malignant neoplasm, unspecified: Secondary | ICD-10-CM | POA: Diagnosis not present

## 2018-03-12 DIAGNOSIS — Z7982 Long term (current) use of aspirin: Secondary | ICD-10-CM | POA: Diagnosis not present

## 2018-03-12 DIAGNOSIS — C3492 Malignant neoplasm of unspecified part of left bronchus or lung: Secondary | ICD-10-CM

## 2018-03-12 DIAGNOSIS — M549 Dorsalgia, unspecified: Secondary | ICD-10-CM

## 2018-03-12 DIAGNOSIS — Z833 Family history of diabetes mellitus: Secondary | ICD-10-CM | POA: Diagnosis not present

## 2018-03-12 DIAGNOSIS — F419 Anxiety disorder, unspecified: Secondary | ICD-10-CM | POA: Diagnosis not present

## 2018-03-12 DIAGNOSIS — B370 Candidal stomatitis: Secondary | ICD-10-CM

## 2018-03-12 DIAGNOSIS — Z79891 Long term (current) use of opiate analgesic: Secondary | ICD-10-CM | POA: Diagnosis not present

## 2018-03-12 DIAGNOSIS — M1990 Unspecified osteoarthritis, unspecified site: Secondary | ICD-10-CM | POA: Diagnosis not present

## 2018-03-12 DIAGNOSIS — Z923 Personal history of irradiation: Secondary | ICD-10-CM

## 2018-03-12 DIAGNOSIS — I10 Essential (primary) hypertension: Secondary | ICD-10-CM | POA: Diagnosis not present

## 2018-03-12 DIAGNOSIS — M129 Arthropathy, unspecified: Secondary | ICD-10-CM | POA: Diagnosis not present

## 2018-03-12 DIAGNOSIS — E669 Obesity, unspecified: Secondary | ICD-10-CM

## 2018-03-12 DIAGNOSIS — Z79899 Other long term (current) drug therapy: Secondary | ICD-10-CM

## 2018-03-12 DIAGNOSIS — R5383 Other fatigue: Secondary | ICD-10-CM

## 2018-03-12 DIAGNOSIS — G939 Disorder of brain, unspecified: Secondary | ICD-10-CM | POA: Diagnosis not present

## 2018-03-12 DIAGNOSIS — R634 Abnormal weight loss: Secondary | ICD-10-CM

## 2018-03-12 DIAGNOSIS — Z808 Family history of malignant neoplasm of other organs or systems: Secondary | ICD-10-CM

## 2018-03-12 DIAGNOSIS — J449 Chronic obstructive pulmonary disease, unspecified: Secondary | ICD-10-CM | POA: Diagnosis not present

## 2018-03-12 DIAGNOSIS — G8929 Other chronic pain: Secondary | ICD-10-CM

## 2018-03-12 DIAGNOSIS — K219 Gastro-esophageal reflux disease without esophagitis: Secondary | ICD-10-CM

## 2018-03-12 DIAGNOSIS — C3412 Malignant neoplasm of upper lobe, left bronchus or lung: Secondary | ICD-10-CM | POA: Diagnosis not present

## 2018-03-12 DIAGNOSIS — Z7951 Long term (current) use of inhaled steroids: Secondary | ICD-10-CM | POA: Diagnosis not present

## 2018-03-12 DIAGNOSIS — F319 Bipolar disorder, unspecified: Secondary | ICD-10-CM | POA: Diagnosis not present

## 2018-03-12 DIAGNOSIS — Z87891 Personal history of nicotine dependence: Secondary | ICD-10-CM

## 2018-03-12 DIAGNOSIS — Z5111 Encounter for antineoplastic chemotherapy: Secondary | ICD-10-CM | POA: Diagnosis not present

## 2018-03-12 DIAGNOSIS — E785 Hyperlipidemia, unspecified: Secondary | ICD-10-CM

## 2018-03-12 DIAGNOSIS — F17210 Nicotine dependence, cigarettes, uncomplicated: Secondary | ICD-10-CM

## 2018-03-12 DIAGNOSIS — Z801 Family history of malignant neoplasm of trachea, bronchus and lung: Secondary | ICD-10-CM

## 2018-03-12 DIAGNOSIS — C3402 Malignant neoplasm of left main bronchus: Secondary | ICD-10-CM

## 2018-03-12 DIAGNOSIS — K5900 Constipation, unspecified: Secondary | ICD-10-CM

## 2018-03-12 DIAGNOSIS — Z803 Family history of malignant neoplasm of breast: Secondary | ICD-10-CM

## 2018-03-12 LAB — COMPREHENSIVE METABOLIC PANEL
ALK PHOS: 79 U/L (ref 38–126)
ALT: 14 U/L (ref 0–44)
ANION GAP: 10 (ref 5–15)
AST: 17 U/L (ref 15–41)
Albumin: 3.7 g/dL (ref 3.5–5.0)
BUN: 22 mg/dL (ref 8–23)
CALCIUM: 9.4 mg/dL (ref 8.9–10.3)
CO2: 27 mmol/L (ref 22–32)
Chloride: 100 mmol/L (ref 98–111)
Creatinine, Ser: 0.73 mg/dL (ref 0.44–1.00)
Glucose, Bld: 137 mg/dL — ABNORMAL HIGH (ref 70–99)
Potassium: 4.2 mmol/L (ref 3.5–5.1)
Sodium: 137 mmol/L (ref 135–145)
TOTAL PROTEIN: 7.3 g/dL (ref 6.5–8.1)
Total Bilirubin: 0.5 mg/dL (ref 0.3–1.2)

## 2018-03-12 LAB — CBC WITH DIFFERENTIAL/PLATELET
Basophils Absolute: 0 10*3/uL (ref 0.0–0.1)
Basophils Relative: 0 %
EOS PCT: 1 %
Eosinophils Absolute: 0 10*3/uL (ref 0.0–0.7)
HCT: 34.9 % — ABNORMAL LOW (ref 36.0–46.0)
Hemoglobin: 10.5 g/dL — ABNORMAL LOW (ref 12.0–15.0)
Lymphocytes Relative: 10 %
Lymphs Abs: 0.4 10*3/uL — ABNORMAL LOW (ref 0.7–4.0)
MCH: 25.4 pg — ABNORMAL LOW (ref 26.0–34.0)
MCHC: 30.1 g/dL (ref 30.0–36.0)
MCV: 84.5 fL (ref 78.0–100.0)
MONO ABS: 0.5 10*3/uL (ref 0.1–1.0)
Monocytes Relative: 13 %
NEUTROS ABS: 2.7 10*3/uL (ref 1.7–7.7)
NEUTROS PCT: 76 %
PLATELETS: 147 10*3/uL — AB (ref 150–400)
RBC: 4.13 MIL/uL (ref 3.87–5.11)
RDW: 21.4 % — ABNORMAL HIGH (ref 11.5–15.5)
WBC: 3.6 10*3/uL — ABNORMAL LOW (ref 4.0–10.5)

## 2018-03-12 MED ORDER — PALONOSETRON HCL INJECTION 0.25 MG/5ML
0.2500 mg | Freq: Once | INTRAVENOUS | Status: AC
  Administered 2018-03-12: 0.25 mg via INTRAVENOUS

## 2018-03-12 MED ORDER — SODIUM CHLORIDE 0.9 % IV SOLN
20.0000 mg | Freq: Once | INTRAVENOUS | Status: AC
  Administered 2018-03-12: 20 mg via INTRAVENOUS
  Filled 2018-03-12: qty 2

## 2018-03-12 MED ORDER — PALONOSETRON HCL INJECTION 0.25 MG/5ML
INTRAVENOUS | Status: AC
  Filled 2018-03-12: qty 5

## 2018-03-12 MED ORDER — SODIUM CHLORIDE 0.9 % IV SOLN
Freq: Once | INTRAVENOUS | Status: AC
  Administered 2018-03-12: 11:00:00 via INTRAVENOUS

## 2018-03-12 MED ORDER — DIPHENHYDRAMINE HCL 50 MG/ML IJ SOLN
50.0000 mg | Freq: Once | INTRAMUSCULAR | Status: AC
  Administered 2018-03-12: 50 mg via INTRAVENOUS

## 2018-03-12 MED ORDER — DIPHENHYDRAMINE HCL 50 MG/ML IJ SOLN
INTRAMUSCULAR | Status: AC
  Filled 2018-03-12: qty 1

## 2018-03-12 MED ORDER — SODIUM CHLORIDE 0.9 % IV SOLN
45.0000 mg/m2 | Freq: Once | INTRAVENOUS | Status: AC
  Administered 2018-03-12: 96 mg via INTRAVENOUS
  Filled 2018-03-12: qty 16

## 2018-03-12 MED ORDER — HEPARIN SOD (PORK) LOCK FLUSH 100 UNIT/ML IV SOLN
500.0000 [IU] | Freq: Once | INTRAVENOUS | Status: AC | PRN
  Administered 2018-03-12: 500 [IU]
  Filled 2018-03-12: qty 5

## 2018-03-12 MED ORDER — FAMOTIDINE IN NACL 20-0.9 MG/50ML-% IV SOLN
INTRAVENOUS | Status: AC
  Filled 2018-03-12: qty 50

## 2018-03-12 MED ORDER — FAMOTIDINE IN NACL 20-0.9 MG/50ML-% IV SOLN
20.0000 mg | Freq: Once | INTRAVENOUS | Status: AC
  Administered 2018-03-12: 20 mg via INTRAVENOUS

## 2018-03-12 MED ORDER — SODIUM CHLORIDE 0.9 % IV SOLN
215.8000 mg | Freq: Once | INTRAVENOUS | Status: AC
  Administered 2018-03-12: 220 mg via INTRAVENOUS
  Filled 2018-03-12: qty 22

## 2018-03-12 NOTE — Progress Notes (Signed)
[place], [place] [id number]   CLINIC:  Medical Oncology/Hematology  PCP:  Fayrene Helper, MD [address], [address] [place] [address] [phone]   REASON FOR VISIT:  Follow-up for stage III squamous cell lung cancer  CURRENT THERAPY: chemoradiation with carboplatin and pacliltaxel  BRIEF ONCOLOGIC HISTORY:    Squamous cell lung cancer, left ([facility])   10/28/2017 Imaging    CT scan of the chest shows left hilar mass measuring 3.4 x 2.9 cm, multiple soft tissue masses in the left upper lobe, largest measuring 1.6 cm  MRI of the brain on 10/31/2017 shows dural based focus of contrast enhancement along the superior left convexity, meningioma favored as it was present on MRI in 2009      10/30/2017 Initial Biopsy    Bronchoscopy and biopsy consistent with squamous cell cancer of the lung      11/08/2017 Family History    2 sisters died of stomach cancer Father died of lung cancer in a smoker Maternal aunt died of thyroid cancer Maternal grandfather had cancer spread to the bone      02/05/2018 -  Chemotherapy    The patient had palonosetron (ALOXI) injection 0.25 mg, 0.25 mg, Intravenous,  Once, 5 of 6 cycles Administration: 0.25 mg (02/11/2018), 0.25 mg (02/18/2018), 0.25 mg (02/25/2018), 0.25 mg (03/04/2018) CARBOplatin (PARAPLATIN) 220 mg in sodium chloride 0.9 % 250 mL chemo infusion, 220 mg (100 % of original dose 215.8 mg), Intravenous,  Once, 5 of 6 cycles Dose modification:   (original dose 215.8 mg, Cycle 1),   (original dose 215.8 mg, Cycle 2), 215.8 mg (original dose 215.8 mg, Cycle 3),   (original dose 215.8 mg, Cycle 4) Administration: 220 mg (02/11/2018), 220 mg (02/18/2018), 220 mg (02/25/2018), 220 mg (03/04/2018) PACLitaxel (TAXOL) 96 mg in sodium chloride 0.9 % 250 mL chemo infusion (</= 55m/m2), 45 mg/m2 = 96 mg, Intravenous,  Once, 5 of 6 cycles Administration: 96 mg (02/11/2018), 96 mg (02/18/2018), 96 mg (02/25/2018), 96 mg  (03/04/2018)  for chemotherapy treatment.         INTERVAL HISTORY:  Ms. RBassinger89y.o. female returns for routine follow-up for lung cancer and consideration for next cycle of chemotherapy. Patient is here with her husband today. Patient is in good spirits and states she feels good overall. She is having some pain and burning when swallowing her food. She has continued to keep her appetite and force herself to eat. She is also supplementing with boost 4 cans a day. Her energy levels are remaining good at 75%. She denies any numbness or tingling in her hands or feet. Denies any nausea, vomiting, or diarrhea. Denies any mouth sores. Patient is still doing well at home and performing all her own ADLs and activities.     REVIEW OF SYSTEMS:  Review of Systems  Constitutional: Positive for fatigue.  HENT:   Positive for trouble swallowing.   Eyes: Negative.   Respiratory: Negative.   Cardiovascular: Negative.   Gastrointestinal: Negative.   Endocrine: Negative.   Genitourinary: Negative.    Musculoskeletal: Negative.   Skin: Negative.   Neurological: Negative.   Hematological: Negative.   Psychiatric/Behavioral: Negative.      PAST MEDICAL/SURGICAL HISTORY:  Past Medical History:  Diagnosis Date  . Anxiety   . Arthritis   . Bipolar disorder ([facility]   . Chronic back pain   . COPD (chronic obstructive pulmonary disease) ([facility]   . Fracture of left  ankle Jan 06, 2010   hairline/ Dr. Alfonso Ramus is treating   . GERD (gastroesophageal reflux disease) 2000  . Hyperlipidemia 1995  . Hypertension 1995  . Nicotine addiction   . Obesity    Past Surgical History:  Procedure Laterality Date  . BACK SURGERY  1999  . BRONCHIAL BRUSHINGS Left 10/30/2017   Procedure: BRONCHIAL BRUSHINGS;  Surgeon: [name], MD;  Location[facility];  Service: Cardiopulmonary;  Laterality: Left;  . BRONCHIAL WASHINGS Left 10/30/2017   Procedure: BRONCHIAL WASHINGS;  Surgeon: [name], MD;   Location: [facility];  Service: Cardiopulmonary;  Laterality: Left;  . CHOLECYSTECTOMY  2001  . COLONOSCOPY N/A 02/02/2016   Procedure: COLONOSCOPY;  Surgeon: [name], MD;  Location: [facility];  Service: Endoscopy;  Laterality: N/A;  930  . FLEXIBLE BRONCHOSCOPY Bilateral 10/30/2017   Procedure: FLEXIBLE BRONCHOSCOPY;  Surgeon: [name], MD;  Location: [facility];  Service: Cardiopulmonary;  Laterality: Bilateral;  . INCISIONAL HERNIA REPAIR  August 17, 2008  . left inguinal hernia herniorrhapy  1980  . PORTACATH PLACEMENT Left 02/03/2018   Procedure: INSERTION PORT-A-CATH;  Surgeon: [name], MD;  Location: [facility];  Service: General;  Laterality: Left;  . removal of thmoma  03/27/2010   Dr. Arlyce Dice   . SPINE SURGERY    . TONSILLECTOMY    . TOTAL ABDOMINAL HYSTERECTOMY W/ BILATERAL SALPINGOOPHORECTOMY  1984     SOCIAL HISTORY:  Social History   Socioeconomic History  . Marital status: Married    Spouse name: Not on file  . Number of children: Not on file  . Years of education: Not on file  . Highest education level: Not on file  Occupational History  . 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 Financial resource strain: Not hard at all  . Food insecurity:    Worry: Never true    Inability: Never true  . Transportation needs:    Medical: No    Non-medical: No  Tobacco Use  . Smoking status: Former Smoker    Packs/day: 1.50    Years: 54.00    Pack years: 81.00    Types: Cigarettes    Last attempt to quit: 08/20/2017    Years since quitting: 0.5  . Smokeless tobacco: Never Used  Substance and Sexual Activity  . Alcohol use: No    Alcohol/week: 0.0 oz  . Drug use: No  . Sexual activity: Not Currently  Lifestyle  . Physical activity:    Days per week: 0 days    Minutes per session: 0 min  . Stress: Only a little  Relationships  . Social connections:    Talks on phone: More than three times a  week    Gets together: More than three times a week    Attends religious service: More than 4 times per year    Active member of club or organization: No    Attends meetings of clubs or organizations: Never    Relationship status: Married  . Intimate partner violence:    Fear of current or ex partner: No    Emotionally abused: No    Physically abused: No    Forced sexual activity: No  Other Topics Concern  . Not on file  Social History Narrative   Pt had 2 stillborns     FAMILY HISTORY:  Family History  Problem Relation Age of Onset  . Heart disease Mother  enlarged  . Diabetes Mother   . Hypertension Mother   . Cancer Father        throat  . Hypertension Father   . Coronary artery disease Father   . Diabetes Sister        x3  . Asthma Sister   . Kidney disease Brother   . Stroke Brother     CURRENT MEDICATIONS:  Outpatient Encounter Medications as of 03/12/2018  Medication Sig  . albuterol (PROVENTIL HFA;VENTOLIN HFA) 108 (90 Base) MCG/ACT inhaler INHALE 2 PUFFS BY MOUTH EVERY 6 HOURS IF NEEDED  . ALPRAZolam (XANAX) 1 MG tablet Take 1 tablet (1 mg total) by mouth 2 (two) times daily.  Marland Kitchen aspirin (ASPIRIN LOW DOSE) 81 MG EC tablet Take 81 mg by mouth daily.    . celecoxib (CELEBREX) 400 MG capsule TAKE ONE CAPSULE BY MOUTH ONCE DAILY  . diltiazem (CARDIZEM CD) 120 MG 24 hr capsule TAKE 1 TABLET ONCE DAILY  . docusate sodium (COLACE) 100 MG capsule Take 200 mg by mouth 2 (two) times daily.  Marland Kitchen EPINEPHrine 0.3 mg/0.3 mL IJ SOAJ injection Inject 0.3 mg into the muscle once.  Marland Kitchen FLUoxetine (PROZAC) 20 MG capsule TAKE 1 TABLET ONCE DAILY  . furosemide (LASIX) 20 MG tablet Take 1 tablet (20 mg total) by mouth 2 (two) times daily.  Marland Kitchen guaiFENesin (MUCINEX) 600 MG 12 hr tablet Take 1 tablet (600 mg total) by mouth 2 (two) times daily.  Marland Kitchen HYDROcodone-acetaminophen (NORCO/VICODIN) 5-325 MG tablet TK 1 T PO TID FOR CHRONIC BACK PAIN  . lactulose (CHRONULAC) 10 GM/15ML solution  Take 15-32m at bedtime every night to assist with moving bowels.  Titrate down if having multiple bowel movements.  . lidocaine-prilocaine (EMLA) cream Apply a quarter size amount to port site 1 hour prior to chemo. Do not rub in. Cover with plastic wrap.  . lisinopril (PRINIVIL,ZESTRIL) 10 MG tablet Take 1 tablet (10 mg total) by mouth daily.  .Marland Kitchenomeprazole (PRILOSEC) 40 MG capsule TAKE 1 CAPSULE ONCE DAILY  . potassium chloride SA (K-DUR,KLOR-CON) 20 MEQ tablet Take 1 tablet (20 mEq total) by mouth daily.  . prochlorperazine (COMPAZINE) 10 MG tablet Take 10 mg by mouth every 6 (six) hours as needed for nausea or vomiting.  . rosuvastatin (CRESTOR) 20 MG tablet Take 1 tablet (20 mg total) by mouth daily.  . SYMBICORT 80-4.5 MCG/ACT inhaler INHALE 2 PUFFS BY MOUTH TWICE DAILY. RINSE MOUTH AFTER USE  . tiZANidine (ZANAFLEX) 4 MG tablet Take 1 tablet (4 mg total) by mouth 3 (three) times daily.  . [DISCONTINUED] albuterol (PROVENTIL) (2.5 MG/3ML) 0.083% nebulizer solution INHALE ONE VIAL VIA NEBULIZER EVERY 6 HOURS AS NEEDED.   No facility-administered encounter medications on file as of 03/12/2018.     ALLERGIES:  Allergies  Allergen Reactions  . Bee Venom Anaphylaxis  . Codeine   . Penicillins Other (See Comments)    Blisters in mouth after dental work Has patient had a PCN reaction causing immediate rash, facial/tongue/throat swelling, SOB or lightheadedness with hypotension: Yes Has patient had a PCN reaction causing severe rash involving mucus membranes or skin necrosis: No Has patient had a PCN reaction that required hospitalization No Has patient had a PCN reaction occurring within the last 10 years: Yes If all of the above answers are "NO", then may proceed with Cephalosporin use.      PHYSICAL EXAM:  ECOG Performance status: 1  Vitals:   03/12/18 0937  BP: (!) 152/75  Pulse: (!) 117  Resp: 16  Temp: 98.1 F (36.7 C)  SpO2: 95%   Filed Weights   03/12/18 0937  Weight:  219 lb 4.8 oz (99.5 kg)    Physical Exam  Constitutional: She is oriented to person, place, and time.  Cardiovascular: Normal rate, regular rhythm and normal heart sounds.  Pulmonary/Chest: Effort normal and breath sounds normal.  Neurological: She is alert and oriented to person, place, and time.  Skin: Skin is warm and dry.     LABORATORY DATA:  I have reviewed the labs as listed.  CBC    Component Value Date/Time   WBC 3.6 (L) 03/12/2018 0909   RBC 4.13 03/12/2018 0909   HGB 10.5 (L) 03/12/2018 0909   HCT 34.9 (L) 03/12/2018 0909   PLT 147 (L) 03/12/2018 0909   MCV 84.5 03/12/2018 0909   MCH 25.4 (L) 03/12/2018 0909   MCHC 30.1 03/12/2018 0909   RDW 21.4 (H) 03/12/2018 0909   LYMPHSABS 0.4 (L) 03/12/2018 0909   MONOABS 0.5 03/12/2018 0909   EOSABS 0.0 03/12/2018 0909   BASOSABS 0.0 03/12/2018 0909   CMP Latest Ref Rng & Units 03/12/2018 03/04/2018 02/25/2018  Glucose 70 - 99 mg/dL 137(H) 134(H) 81  BUN 8 - 23 mg/dL _0 Creatinine 0.44 - 1.00 mg/dL 0.73 0.65 0.72  Sodium 135 - 145 mmol/L 137 140 137  Potassium 3.5 - 5.1 mmol/L 4.2 4.5 5.0  Chloride 98 - 111 mmol/L 100 105 104  CO2 22 - 32 mmol/L _1 Calcium 8.9 - 10.3 mg/dL 9.4 9.3 8.9  Total Protein 6.5 - 8.1 g/dL 7.3 7.1 7.2  Total Bilirubin 0.3 - 1.2 mg/dL 0.5 0.4 0.3  Alkaline Phos 38 - 126 U/L 79 77 80  AST 15 - 41 U/L 17 16 14(L)  ALT 0 - 44 U/L _2 ASSESSMENT & PLAN:   Squamous cell lung cancer, left ([facility]) 1.  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  She was evaluated by Dr. Roxan Hockey on 01/08/2018 and felt not to be a surgical candidate. - Started on chemoradiation therapy with weekly carboplatin and paclitaxel on 02/11/2018.  For chemotherapy was on 03/04/2018.  -She is using Magic mouthwash for retrosternal pain on swallowing.  This is well controlled.  She is able to eat well and not losing weight. -She will proceed with week 5 of treatment today.  I have reviewed her blood counts which are adequate to proceed with treatment without any dose modifications.  She will be back next week for her likely last treatment of chemotherapy.  She will and her radiation on 04/11/2018.  2.  Family history: The 2 of her sisters died of stomach cancer.  Father died of lung cancer.  Maternal aunt died of thyroid cancer.  Maternal grandfather had cancer in the bone.  Primary is unknown.  We will consider genetic testing.      Orders placed  this encounter:  Orders Placed This Encounter  Procedures  . CBC with Differential/Platelet  . Comprehensive metabolic panel      Derek Jack, MD [place] [phone]

## 2018-03-12 NOTE — Progress Notes (Signed)
Tolerated infusion w/o adverse reaction.  Alert, in no distress.  VSS.  Discharged ambulatory in c/o spouse.  

## 2018-03-12 NOTE — Assessment & Plan Note (Signed)
1.  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  She was evaluated by Dr. Roxan Hockey on 01/08/2018 and felt not to be a surgical candidate. - Started on chemoradiation therapy with weekly carboplatin and paclitaxel on 02/11/2018.  For chemotherapy was on 03/04/2018.  -She is using Magic mouthwash for retrosternal pain on swallowing.  This is well controlled.  She is able to eat well and not losing weight. -She will proceed with week 5 of treatment today.  I have reviewed her blood counts which are adequate to proceed with treatment without any dose modifications.  She will be back next week for her likely last treatment of chemotherapy.  She will and her radiation on 04/11/2018.  2.  Family history: The 2 of her sisters died of stomach cancer.  Father died of lung cancer.  Maternal aunt died of thyroid cancer.  Maternal grandfather had cancer in the bone.  Primary is unknown.  We will consider genetic testing.

## 2018-03-13 DIAGNOSIS — J449 Chronic obstructive pulmonary disease, unspecified: Secondary | ICD-10-CM | POA: Diagnosis not present

## 2018-03-13 DIAGNOSIS — Z809 Family history of malignant neoplasm, unspecified: Secondary | ICD-10-CM | POA: Diagnosis not present

## 2018-03-13 DIAGNOSIS — Z7982 Long term (current) use of aspirin: Secondary | ICD-10-CM | POA: Diagnosis not present

## 2018-03-13 DIAGNOSIS — K219 Gastro-esophageal reflux disease without esophagitis: Secondary | ICD-10-CM | POA: Diagnosis not present

## 2018-03-13 DIAGNOSIS — E785 Hyperlipidemia, unspecified: Secondary | ICD-10-CM | POA: Diagnosis not present

## 2018-03-13 DIAGNOSIS — Z79899 Other long term (current) drug therapy: Secondary | ICD-10-CM | POA: Diagnosis not present

## 2018-03-13 DIAGNOSIS — I10 Essential (primary) hypertension: Secondary | ICD-10-CM | POA: Diagnosis not present

## 2018-03-13 DIAGNOSIS — F419 Anxiety disorder, unspecified: Secondary | ICD-10-CM | POA: Diagnosis not present

## 2018-03-13 DIAGNOSIS — Z7951 Long term (current) use of inhaled steroids: Secondary | ICD-10-CM | POA: Diagnosis not present

## 2018-03-13 DIAGNOSIS — Z79891 Long term (current) use of opiate analgesic: Secondary | ICD-10-CM | POA: Diagnosis not present

## 2018-03-13 DIAGNOSIS — G939 Disorder of brain, unspecified: Secondary | ICD-10-CM | POA: Diagnosis not present

## 2018-03-13 DIAGNOSIS — C3412 Malignant neoplasm of upper lobe, left bronchus or lung: Secondary | ICD-10-CM | POA: Diagnosis not present

## 2018-03-13 DIAGNOSIS — Z833 Family history of diabetes mellitus: Secondary | ICD-10-CM | POA: Diagnosis not present

## 2018-03-13 DIAGNOSIS — M1990 Unspecified osteoarthritis, unspecified site: Secondary | ICD-10-CM | POA: Diagnosis not present

## 2018-03-13 DIAGNOSIS — Z87891 Personal history of nicotine dependence: Secondary | ICD-10-CM | POA: Diagnosis not present

## 2018-03-18 ENCOUNTER — Other Ambulatory Visit: Payer: Self-pay | Admitting: Family Medicine

## 2018-03-19 ENCOUNTER — Encounter (HOSPITAL_COMMUNITY): Payer: Self-pay

## 2018-03-19 ENCOUNTER — Inpatient Hospital Stay (HOSPITAL_COMMUNITY): Payer: PPO | Attending: Hematology

## 2018-03-19 ENCOUNTER — Inpatient Hospital Stay (HOSPITAL_COMMUNITY): Payer: PPO

## 2018-03-19 ENCOUNTER — Encounter (HOSPITAL_COMMUNITY): Payer: Self-pay | Admitting: Hematology

## 2018-03-19 ENCOUNTER — Inpatient Hospital Stay (HOSPITAL_BASED_OUTPATIENT_CLINIC_OR_DEPARTMENT_OTHER): Payer: PPO | Admitting: Hematology

## 2018-03-19 VITALS — BP 156/77 | HR 109 | Temp 98.6°F | Resp 16

## 2018-03-19 DIAGNOSIS — K219 Gastro-esophageal reflux disease without esophagitis: Secondary | ICD-10-CM | POA: Insufficient documentation

## 2018-03-19 DIAGNOSIS — F17210 Nicotine dependence, cigarettes, uncomplicated: Secondary | ICD-10-CM | POA: Diagnosis not present

## 2018-03-19 DIAGNOSIS — G8929 Other chronic pain: Secondary | ICD-10-CM | POA: Insufficient documentation

## 2018-03-19 DIAGNOSIS — I10 Essential (primary) hypertension: Secondary | ICD-10-CM

## 2018-03-19 DIAGNOSIS — Z5111 Encounter for antineoplastic chemotherapy: Secondary | ICD-10-CM

## 2018-03-19 DIAGNOSIS — Z87891 Personal history of nicotine dependence: Secondary | ICD-10-CM | POA: Diagnosis not present

## 2018-03-19 DIAGNOSIS — Z800 Family history of malignant neoplasm of digestive organs: Secondary | ICD-10-CM

## 2018-03-19 DIAGNOSIS — E669 Obesity, unspecified: Secondary | ICD-10-CM | POA: Insufficient documentation

## 2018-03-19 DIAGNOSIS — E785 Hyperlipidemia, unspecified: Secondary | ICD-10-CM | POA: Diagnosis not present

## 2018-03-19 DIAGNOSIS — C3412 Malignant neoplasm of upper lobe, left bronchus or lung: Secondary | ICD-10-CM

## 2018-03-19 DIAGNOSIS — Z808 Family history of malignant neoplasm of other organs or systems: Secondary | ICD-10-CM

## 2018-03-19 DIAGNOSIS — R1310 Dysphagia, unspecified: Secondary | ICD-10-CM | POA: Insufficient documentation

## 2018-03-19 DIAGNOSIS — Z7982 Long term (current) use of aspirin: Secondary | ICD-10-CM

## 2018-03-19 DIAGNOSIS — F319 Bipolar disorder, unspecified: Secondary | ICD-10-CM | POA: Diagnosis not present

## 2018-03-19 DIAGNOSIS — Z801 Family history of malignant neoplasm of trachea, bronchus and lung: Secondary | ICD-10-CM

## 2018-03-19 DIAGNOSIS — C3492 Malignant neoplasm of unspecified part of left bronchus or lung: Secondary | ICD-10-CM

## 2018-03-19 DIAGNOSIS — Z79899 Other long term (current) drug therapy: Secondary | ICD-10-CM | POA: Diagnosis not present

## 2018-03-19 LAB — CBC WITH DIFFERENTIAL/PLATELET
BASOS ABS: 0 10*3/uL (ref 0.0–0.1)
BASOS PCT: 0 %
EOS ABS: 0 10*3/uL (ref 0.0–0.7)
Eosinophils Relative: 0 %
HCT: 33.4 % — ABNORMAL LOW (ref 36.0–46.0)
HEMOGLOBIN: 9.9 g/dL — AB (ref 12.0–15.0)
Lymphocytes Relative: 13 %
Lymphs Abs: 0.3 10*3/uL — ABNORMAL LOW (ref 0.7–4.0)
MCH: 25.4 pg — ABNORMAL LOW (ref 26.0–34.0)
MCHC: 29.6 g/dL — ABNORMAL LOW (ref 30.0–36.0)
MCV: 85.6 fL (ref 78.0–100.0)
Monocytes Absolute: 0.3 10*3/uL (ref 0.1–1.0)
Monocytes Relative: 11 %
NEUTROS ABS: 1.8 10*3/uL (ref 1.7–7.7)
NEUTROS PCT: 76 %
PLATELETS: 141 10*3/uL — AB (ref 150–400)
RBC: 3.9 MIL/uL (ref 3.87–5.11)
RDW: 23 % — ABNORMAL HIGH (ref 11.5–15.5)
WBC: 2.5 10*3/uL — AB (ref 4.0–10.5)

## 2018-03-19 LAB — COMPREHENSIVE METABOLIC PANEL
ALBUMIN: 3.6 g/dL (ref 3.5–5.0)
ALK PHOS: 74 U/L (ref 38–126)
ALT: 16 U/L (ref 0–44)
AST: 19 U/L (ref 15–41)
Anion gap: 8 (ref 5–15)
BUN: 24 mg/dL — AB (ref 8–23)
CALCIUM: 9.1 mg/dL (ref 8.9–10.3)
CHLORIDE: 103 mmol/L (ref 98–111)
CO2: 25 mmol/L (ref 22–32)
CREATININE: 0.79 mg/dL (ref 0.44–1.00)
GFR calc non Af Amer: >60 mL/min (ref >60)
GLUCOSE: 135 mg/dL — AB (ref 70–99)
Potassium: 4.7 mmol/L (ref 3.5–5.1)
SODIUM: 136 mmol/L (ref 135–145)
Total Bilirubin: 0.5 mg/dL (ref 0.3–1.2)
Total Protein: 7 g/dL (ref 6.5–8.1)

## 2018-03-19 MED ORDER — SODIUM CHLORIDE 0.9 % IV SOLN
Freq: Once | INTRAVENOUS | Status: AC
  Administered 2018-03-19: 10:00:00 via INTRAVENOUS

## 2018-03-19 MED ORDER — SODIUM CHLORIDE 0.9 % IV SOLN
215.8000 mg | Freq: Once | INTRAVENOUS | Status: AC
  Administered 2018-03-19: 220 mg via INTRAVENOUS
  Filled 2018-03-19: qty 22

## 2018-03-19 MED ORDER — DIPHENHYDRAMINE HCL 50 MG/ML IJ SOLN
50.0000 mg | Freq: Once | INTRAMUSCULAR | Status: AC
  Administered 2018-03-19: 50 mg via INTRAVENOUS
  Filled 2018-03-19: qty 1

## 2018-03-19 MED ORDER — HEPARIN SOD (PORK) LOCK FLUSH 100 UNIT/ML IV SOLN
500.0000 [IU] | Freq: Once | INTRAVENOUS | Status: AC | PRN
  Administered 2018-03-19: 500 [IU]
  Filled 2018-03-19 (×2): qty 5

## 2018-03-19 MED ORDER — SODIUM CHLORIDE 0.9 % IV SOLN
45.0000 mg/m2 | Freq: Once | INTRAVENOUS | Status: AC
  Administered 2018-03-19: 96 mg via INTRAVENOUS
  Filled 2018-03-19: qty 16

## 2018-03-19 MED ORDER — FAMOTIDINE IN NACL 20-0.9 MG/50ML-% IV SOLN
20.0000 mg | Freq: Once | INTRAVENOUS | Status: AC
  Administered 2018-03-19: 20 mg via INTRAVENOUS
  Filled 2018-03-19: qty 50

## 2018-03-19 MED ORDER — PALONOSETRON HCL INJECTION 0.25 MG/5ML
0.2500 mg | Freq: Once | INTRAVENOUS | Status: AC
  Administered 2018-03-19: 0.25 mg via INTRAVENOUS
  Filled 2018-03-19: qty 5

## 2018-03-19 MED ORDER — SODIUM CHLORIDE 0.9% FLUSH
10.0000 mL | INTRAVENOUS | Status: DC | PRN
  Administered 2018-03-19: 10 mL
  Filled 2018-03-19: qty 10

## 2018-03-19 MED ORDER — SODIUM CHLORIDE 0.9 % IV SOLN
20.0000 mg | Freq: Once | INTRAVENOUS | Status: AC
  Administered 2018-03-19: 20 mg via INTRAVENOUS
  Filled 2018-03-19: qty 2

## 2018-03-19 NOTE — Assessment & Plan Note (Signed)
1.  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  She was evaluated by Dr. Roxan Hockey on 01/08/2018 and felt not to be a surgical candidate. - Started on chemoradiation therapy with weekly carboplatin and paclitaxel on 02/11/2018. - She had trouble with swallowing and retrosternal pain over the weekend.  [facility] mouthwash did not help.  Dr.Yanagihara gave her lidocaine 15 mL every 4 hours which is helping.  She is able to eat well now. -She will proceed with her final cycle of chemotherapy.  Today her white count is slightly low at 2.5 even though her absolute neutrophil count is 1800 and adequate.  I plan to check her CBC with differential on Friday to see if she needs any Neupogen over the weekend.  I plan to see her back next week to repeat her counts.  She will have a CT scan done 4 weeks after completion of radiation.  We plan to start immunotherapy with Durvalumab within 6 weeks after completion of chemoradiation therapy.  2.  Family history: The 2 of her sisters died of stomach cancer.  Father died of lung cancer.  Maternal aunt died of thyroid cancer.  Maternal grandfather had cancer in the bone.  Primary is unknown.  We will consider genetic testing.

## 2018-03-19 NOTE — Progress Notes (Signed)
[name], [place] [address]   CLINIC:  Medical Oncology/Hematology  PCP:  Fayrene Helper, MD [address] [place] [address] [phone]   REASON FOR VISIT:  Follow-up for stage III squamous cell lung cancer  CURRENT THERAPY: chemoradiation and carboplatin and paclitaxel  BRIEF ONCOLOGIC HISTORY:    Squamous cell lung cancer, left ([facility])   10/28/2017 Imaging    CT scan of the chest shows left hilar mass measuring 3.4 x 2.9 cm, multiple soft tissue masses in the left upper lobe, largest measuring 1.6 cm  MRI of the brain on 10/31/2017 shows dural based focus of contrast enhancement along the superior left convexity, meningioma favored as it was present on MRI in 2009      10/30/2017 Initial Biopsy    Bronchoscopy and biopsy consistent with squamous cell cancer of the lung      11/08/2017 Family History    2 sisters died of stomach cancer Father died of lung cancer in a smoker Maternal aunt died of thyroid cancer Maternal grandfather had cancer spread to the bone      02/05/2018 -  Chemotherapy    The patient had palonosetron (ALOXI) injection 0.25 mg, 0.25 mg, Intravenous,  Once, 6 of 6 cycles Administration: 0.25 mg (02/11/2018), 0.25 mg (02/18/2018), 0.25 mg (02/25/2018), 0.25 mg (03/04/2018), 0.25 mg (03/12/2018), 0.25 mg (03/19/2018) CARBOplatin (PARAPLATIN) 220 mg in sodium chloride 0.9 % 250 mL chemo infusion, 220 mg (100 % of original dose 215.8 mg), Intravenous,  Once, 6 of 6 cycles Dose modification:   (original dose 215.8 mg, Cycle 1),   (original dose 215.8 mg, Cycle 2), 215.8 mg (original dose 215.8 mg, Cycle 3),   (original dose 215.8 mg, Cycle 4) Administration: 220 mg (02/11/2018), 220 mg (02/18/2018), 220 mg (02/25/2018), 220 mg (03/04/2018), 220 mg (03/12/2018) PACLitaxel (TAXOL) 96 mg in sodium chloride 0.9 % 250 mL chemo infusion (</= 96m/m2), 45 mg/m2 = 96 mg, Intravenous,  Once, 6 of 6 cycles Administration: 96  mg (02/11/2018), 96 mg (02/18/2018), 96 mg (02/25/2018), 96 mg (03/04/2018), 96 mg (03/12/2018), 96 mg (03/19/2018)  for chemotherapy treatment.         INTERVAL HISTORY:  [name] female returns for routine follow-up for stage III squamous cell lung cancer and consideration for next cycle of chemotherapy. Patient is here today with her husband and daughter. She overall has done really well with treatment. This past weekend she pain and difficulty swallowing. She received a medication with lidocaine and it has helped her. She has still maintained her appetite during this. She is maintaining her weight. Patient appetite and energy level are at 75%. Otherwise she feels good and is ready for her next cycle of chemo today.    REVIEW OF SYSTEMS:  Review of Systems  HENT:   Positive for trouble swallowing (painful).   All other systems reviewed and are negative.    PAST MEDICAL/SURGICAL HISTORY:  Past Medical History:  Diagnosis Date  . Anxiety   . Arthritis   . Bipolar disorder ([facility]   . Chronic back pain   . COPD (chronic obstructive pulmonary disease) ([facility]   . Fracture of left ankle Jan 06, 2010   hairline/ Dr. KAlfonso Ramusis treating   . GERD (gastroesophageal reflux disease) 2000  . Hyperlipidemia 1995  . Hypertension 1995  . Nicotine addiction   . Obesity    Past Surgical History:  Procedure Laterality Date  . BACK  SURGERY  1999  . BRONCHIAL BRUSHINGS Left 10/30/2017   Procedure: BRONCHIAL BRUSHINGS;  Surgeon: [name], MD;  [facility];  Service: Cardiopulmonary;  Laterality: Left;  . BRONCHIAL WASHINGS Left 10/30/2017   Procedure: BRONCHIAL WASHINGS;  Surgeon: [name], MD;  Location: [facility];  Service: Cardiopulmonary;  Laterality: Left;  . CHOLECYSTECTOMY  2001  . COLONOSCOPY N/A 02/02/2016   Procedure: COLONOSCOPY;  Surgeon: [name], MD;  Location: [facility];  Service: Endoscopy;  Laterality: N/A;  930  . FLEXIBLE BRONCHOSCOPY  Bilateral 10/30/2017   Procedure: FLEXIBLE BRONCHOSCOPY;  Surgeon: [name], MD;  Location: [facility];  Service: Cardiopulmonary;  Laterality: Bilateral;  . INCISIONAL HERNIA REPAIR  August 17, 2008  . left inguinal hernia herniorrhapy  1980  . PORTACATH PLACEMENT Left 02/03/2018   Procedure: INSERTION PORT-A-CATH;  Surgeon: [name], MD;  Location: [facility];  Service: General;  Laterality: Left;  . removal of thmoma  03/27/2010   Dr. Arlyce Dice   . SPINE SURGERY    . TONSILLECTOMY    . TOTAL ABDOMINAL HYSTERECTOMY W/ BILATERAL SALPINGOOPHORECTOMY  1984     SOCIAL HISTORY:  Social History   Socioeconomic History  . Marital status: Married    Spouse name: Not on file  . Number of children: Not on file  . Years of education: Not on file  . Highest education level: Not on file  Occupational History  . 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 Financial resource strain: Not hard at all  . Food insecurity:    Worry: Never true    Inability: Never true  . Transportation needs:    Medical: No    Non-medical: No  Tobacco Use  . Smoking status: Former Smoker    Packs/day: 1.50    Years: 54.00    Pack years: 81.00    Types: Cigarettes    Last attempt to quit: 08/20/2017    Years since quitting: 0.5  . Smokeless tobacco: Never Used  Substance and Sexual Activity  . Alcohol use: No    Alcohol/week: 0.0 oz  . Drug use: No  . Sexual activity: Not Currently  Lifestyle  . Physical activity:    Days per week: 0 days    Minutes per session: 0 min  . Stress: Only a little  Relationships  . Social connections:    Talks on phone: More than three times a week    Gets together: More than three times a week    Attends religious service: More than 4 times per year    Active member of club or organization: No    Attends meetings of clubs or organizations: Never    Relationship status: Married  . Intimate partner violence:     Fear of current or ex partner: No    Emotionally abused: No    Physically abused: No    Forced sexual activity: No  Other Topics Concern  . Not on file  Social History Narrative   Pt had 2 stillborns     FAMILY HISTORY:  Family History  Problem Relation Age of Onset  . Heart disease Mother        enlarged  . Diabetes Mother   . Hypertension Mother   . Cancer Father        throat  . Hypertension Father   . Coronary artery disease Father   . Diabetes Sister  x3  . Asthma Sister   . Kidney disease Brother   . Stroke Brother     CURRENT MEDICATIONS:  Outpatient Encounter Medications as of 03/19/2018  Medication Sig  . albuterol (PROAIR HFA) 108 (90 Base) MCG/ACT inhaler INHALE 2 PUFFS BY MOUTH EVERY 6 HOURS IF NEEDED  . ALPRAZolam (XANAX) 1 MG tablet Take 1 tablet (1 mg total) by mouth 2 (two) times daily.  Marland Kitchen aspirin (ASPIRIN LOW DOSE) 81 MG EC tablet Take 81 mg by mouth daily.    . celecoxib (CELEBREX) 400 MG capsule TAKE ONE CAPSULE BY MOUTH ONCE DAILY  . diltiazem (CARDIZEM CD) 120 MG 24 hr capsule TAKE 1 TABLET ONCE DAILY  . docusate sodium (COLACE) 100 MG capsule Take 200 mg by mouth 2 (two) times daily.  Marland Kitchen EPINEPHrine 0.3 mg/0.3 mL IJ SOAJ injection Inject 0.3 mg into the muscle once.  Marland Kitchen FLUoxetine (PROZAC) 20 MG capsule TAKE 1 TABLET ONCE DAILY  . furosemide (LASIX) 20 MG tablet Take 1 tablet (20 mg total) by mouth 2 (two) times daily.  Marland Kitchen guaiFENesin (MUCINEX) 600 MG 12 hr tablet Take 1 tablet (600 mg total) by mouth 2 (two) times daily.  Marland Kitchen HYDROcodone-acetaminophen (NORCO/VICODIN) 5-325 MG tablet TK 1 T PO TID FOR CHRONIC BACK PAIN  . lactulose (CHRONULAC) 10 GM/15ML solution Take 15-52m at bedtime every night to assist with moving bowels.  Titrate down if having multiple bowel movements.  . lidocaine-prilocaine (EMLA) cream Apply a quarter size amount to port site 1 hour prior to chemo. Do not rub in. Cover with plastic wrap.  . lisinopril (PRINIVIL,ZESTRIL) 10  MG tablet Take 1 tablet (10 mg total) by mouth daily.  .Marland Kitchenomeprazole (PRILOSEC) 40 MG capsule TAKE 1 CAPSULE ONCE DAILY  . potassium chloride SA (K-DUR,KLOR-CON) 20 MEQ tablet Take 1 tablet (20 mEq total) by mouth daily.  . prochlorperazine (COMPAZINE) 10 MG tablet Take 10 mg by mouth every 6 (six) hours as needed for nausea or vomiting.  . rosuvastatin (CRESTOR) 20 MG tablet Take 1 tablet (20 mg total) by mouth daily.  . SYMBICORT 80-4.5 MCG/ACT inhaler INHALE 2 PUFFS BY MOUTH TWICE DAILY. RINSE MOUTH AFTER USE  . tiZANidine (ZANAFLEX) 4 MG tablet Take 1 tablet (4 mg total) by mouth 3 (three) times daily.   No facility-administered encounter medications on file as of 03/19/2018.     ALLERGIES:  Allergies  Allergen Reactions  . Bee Venom Anaphylaxis  . Codeine   . Penicillins Other (See Comments)    Blisters in mouth after dental work Has patient had a PCN reaction causing immediate rash, facial/tongue/throat swelling, SOB or lightheadedness with hypotension: Yes Has patient had a PCN reaction causing severe rash involving mucus membranes or skin necrosis: No Has patient had a PCN reaction that required hospitalization No Has patient had a PCN reaction occurring within the last 10 years: Yes If all of the above answers are "NO", then may proceed with Cephalosporin use.      PHYSICAL EXAM:  ECOG Performance status: 1  Vitals:   03/19/18 0908  BP: (!) 148/88  Pulse: (!) 114  Resp: 18  Temp: 98.8 F (37.1 C)  SpO2: 94%   Filed Weights   03/19/18 0908  Weight: 218 lb 12.8 oz (99.2 kg)    Physical Exam  Constitutional: She is oriented to person, place, and time. She appears well-developed and well-nourished.  Cardiovascular: Normal rate, regular rhythm and normal heart sounds.  Pulmonary/Chest: Effort normal and breath sounds normal.  Neurological: She is alert and oriented to person, place, and time.  Skin: Skin is warm and dry.     LABORATORY DATA:  I have reviewed the  labs as listed.  CBC    Component Value Date/Time   WBC 2.5 (L) 03/19/2018 0841   RBC 3.90 03/19/2018 0841   HGB 9.9 (L) 03/19/2018 0841   HCT 33.4 (L) 03/19/2018 0841   PLT 141 (L) 03/19/2018 0841   MCV 85.6 03/19/2018 0841   MCH 25.4 (L) 03/19/2018 0841   MCHC 29.6 (L) 03/19/2018 0841   RDW 23.0 (H) 03/19/2018 0841   LYMPHSABS 0.3 (L) 03/19/2018 0841   MONOABS 0.3 03/19/2018 0841   EOSABS 0.0 03/19/2018 0841   BASOSABS 0.0 03/19/2018 0841   CMP Latest Ref Rng & Units 03/19/2018 03/12/2018 03/04/2018  Glucose 70 - 99 mg/dL 135(H) 137(H) 134(H)  BUN 8 - 23 mg/dL 24(H) 22 17  Creatinine 0.44 - 1.00 mg/dL 0.79 0.73 0.65  Sodium 135 - 145 mmol/L 136 137 140  Potassium 3.5 - 5.1 mmol/L 4.7 4.2 4.5  Chloride 98 - 111 mmol/L 103 100 105  CO2 22 - 32 mmol/L _0 Calcium 8.9 - 10.3 mg/dL 9.1 9.4 9.3  Total Protein 6.5 - 8.1 g/dL 7.0 7.3 7.1  Total Bilirubin 0.3 - 1.2 mg/dL 0.5 0.5 0.4  Alkaline Phos 38 - 126 U/L 74 79 77  AST 15 - 41 U/L _1 ALT 0 - 44 U/L _2 ASSESSMENT & PLAN:   Squamous cell lung cancer, left ([facility]) 1.  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  She was evaluated by Dr. Roxan Hockey on 01/08/2018 and felt not to be a surgical candidate. - Started on chemoradiation therapy with weekly carboplatin and paclitaxel on 02/11/2018. - She had trouble with swallowing and retrosternal pain over the  weekend.  [facility] mouthwash did not help.  Dr.Yanagihara gave her lidocaine 15 mL every 4 hours which is helping.  She is able to eat well now. -She will proceed with her final cycle of chemotherapy.  Today her white count is slightly low at 2.5 even though her absolute neutrophil count is 1800 and adequate.  I plan to check her CBC with differential on Friday to see if she needs any Neupogen over the weekend.  I plan to see her back next week to repeat her counts.  She will have a CT scan done 4 weeks after completion of radiation.  We plan to start immunotherapy with Durvalumab within 6 weeks after completion of chemoradiation therapy.  2.  Family history: The 2 of her sisters died of stomach cancer.  Father died of lung cancer.  Maternal aunt died of thyroid cancer.  Maternal grandfather had cancer in the bone.  Primary is unknown.  We will consider genetic testing.      Orders placed this encounter:  Orders Placed This Encounter  Procedures  .  CBC with Differential/Platelet  . Comprehensive metabolic panel  . CBC with Differential/Platelet  . Comprehensive metabolic panel      Derek Jack, MD [place] [phone]

## 2018-03-19 NOTE — Patient Instructions (Addendum)
[place] at [facility] Discharge Instructions  You saw Dr. Delton Coombes today. Come Friday and have labs drawn. Then follow up with [place] next week.    Thank you for choosing [place] at [facility] to provide your oncology and hematology care.  To afford each patient quality time with our provider, please arrive at least 15 minutes before your scheduled appointment time.   If you have a lab appointment with the [facility] please come in thru the  Main Entrance and check in at the main information desk  You need to re-schedule your appointment should you arrive 10 or more minutes late.  We strive to give you quality time with our providers, and arriving late affects you and other patients whose appointments are after yours.  Also, if you no show three or more times for appointments you may be dismissed from the clinic at the providers discretion.     Again, thank you for choosing [facility].  Our hope is that these requests will decrease the amount of time that you wait before being seen by our physicians.       _____________________________________________________________  Should you have questions after your visit to [facility], please contact our office at [phone] between the hours of 8:00 a.m. and 4:30 p.m.  Voicemails left after 4:00 p.m. will not be returned until the following business day.  For prescription refill requests, have your pharmacy contact our office and allow 72 hours.    Cancer Center Support Programs:   > Cancer Support Group  2nd Tuesday of the month 1pm-2pm, Journey Room

## 2018-03-20 NOTE — Progress Notes (Signed)
Treatment given per orders. Patient tolerated it well without problems. Vitals stable and discharged home from clinic ambulatory. Follow up as scheduled.  

## 2018-03-20 NOTE — Patient Instructions (Signed)
[facility] Discharge Instructions for Patients Receiving Chemotherapy   Beginning January 23rd 2017 lab work for the Cancer Center will be done in the  Main lab at [facility] on 1st floor. If you have a lab appointment with the Cancer Center please come in thru the  Main Entrance and check in at the main information desk   Today you received the following chemotherapy agents   To help prevent nausea and vomiting after your treatment, we encourage you to take your nausea medication     If you develop nausea and vomiting, or diarrhea that is not controlled by your medication, call the clinic.  The clinic phone number is [phone]. Office hours are Monday-Friday 8:30am-5:00pm.  BELOW ARE SYMPTOMS THAT SHOULD BE REPORTED IMMEDIATELY:  *FEVER GREATER THAN 101.0 F  *CHILLS WITH OR WITHOUT FEVER  NAUSEA AND VOMITING THAT IS NOT CONTROLLED WITH YOUR NAUSEA MEDICATION  *UNUSUAL SHORTNESS OF BREATH  *UNUSUAL BRUISING OR BLEEDING  TENDERNESS IN MOUTH AND THROAT WITH OR WITHOUT PRESENCE OF ULCERS  *URINARY PROBLEMS  *BOWEL PROBLEMS  UNUSUAL RASH Items with * indicate a potential emergency and should be followed up as soon as possible. If you have an emergency after office hours please contact your primary care physician or go to the nearest emergency department.  Please call the clinic during office hours if you have any questions or concerns.   You may also contact the Patient Navigator at [phone] should you have any questions or need assistance in obtaining follow up care.      Resources For Cancer Patients and their Caregivers ? American Cancer Society: Can assist with transportation, wigs, general needs, runs Look Good Feel Better.        1-888-227-6333 ? Cancer Care: Provides financial assistance, online support groups, medication/co-pay assistance.  1-800-813-HOPE (4673) ? [facility] Assists [place] cancer  patients and their families through emotional , educational and financial support.  336-427-4357 ? [place] DSS Where to apply for food stamps, Medicaid and utility assistance. 336-342-1394 ? [facility]: Transportation to medical appointments. 336-347-2287 ? Social Security Administration: May apply for disability if have a Stage IV cancer. 336-342-7796 1-800-772-1213 ? [place] Aging, Disability and Transit Services: Assists with nutrition, care and transit needs. 336-349-2343         

## 2018-03-21 ENCOUNTER — Telehealth (HOSPITAL_COMMUNITY): Payer: Self-pay | Admitting: Nurse Practitioner

## 2018-03-21 ENCOUNTER — Inpatient Hospital Stay (HOSPITAL_COMMUNITY): Payer: PPO

## 2018-03-21 DIAGNOSIS — C3492 Malignant neoplasm of unspecified part of left bronchus or lung: Secondary | ICD-10-CM

## 2018-03-21 DIAGNOSIS — Z5111 Encounter for antineoplastic chemotherapy: Secondary | ICD-10-CM | POA: Diagnosis not present

## 2018-03-21 LAB — CBC WITH DIFFERENTIAL/PLATELET
BASOS PCT: 0 %
Basophils Absolute: 0 10*3/uL (ref 0.0–0.1)
Eosinophils Absolute: 0 10*3/uL (ref 0.0–0.7)
Eosinophils Relative: 0 %
HCT: 34.3 % — ABNORMAL LOW (ref 36.0–46.0)
HEMOGLOBIN: 10.4 g/dL — AB (ref 12.0–15.0)
Lymphocytes Relative: 5 %
Lymphs Abs: 0.1 10*3/uL — ABNORMAL LOW (ref 0.7–4.0)
MCH: 26 pg (ref 26.0–34.0)
MCHC: 30.3 g/dL (ref 30.0–36.0)
MCV: 85.8 fL (ref 78.0–100.0)
Monocytes Absolute: 0.2 10*3/uL (ref 0.1–1.0)
Monocytes Relative: 9 %
NEUTROS ABS: 2.5 10*3/uL (ref 1.7–7.7)
NEUTROS PCT: 86 %
Platelets: 166 10*3/uL (ref 150–400)
RBC: 4 MIL/uL (ref 3.87–5.11)
RDW: 23.3 % — ABNORMAL HIGH (ref 11.5–15.5)
WBC: 2.8 10*3/uL — AB (ref 4.0–10.5)

## 2018-03-21 LAB — COMPREHENSIVE METABOLIC PANEL
ALBUMIN: 4 g/dL (ref 3.5–5.0)
ALK PHOS: 66 U/L (ref 38–126)
ALT: 20 U/L (ref 0–44)
AST: 24 U/L (ref 15–41)
Anion gap: 11 (ref 5–15)
BUN: 19 mg/dL (ref 8–23)
CALCIUM: 9.1 mg/dL (ref 8.9–10.3)
CO2: 25 mmol/L (ref 22–32)
CREATININE: 0.82 mg/dL (ref 0.44–1.00)
Chloride: 101 mmol/L (ref 98–111)
GFR calc Af Amer: >60 mL/min (ref >60)
GFR calc non Af Amer: >60 mL/min (ref >60)
GLUCOSE: 120 mg/dL — AB (ref 70–99)
Potassium: 4.4 mmol/L (ref 3.5–5.1)
Sodium: 137 mmol/L (ref 135–145)
Total Bilirubin: 0.6 mg/dL (ref 0.3–1.2)
Total Protein: 7.3 g/dL (ref 6.5–8.1)

## 2018-03-26 ENCOUNTER — Inpatient Hospital Stay (HOSPITAL_BASED_OUTPATIENT_CLINIC_OR_DEPARTMENT_OTHER): Payer: PPO | Admitting: Hematology

## 2018-03-26 ENCOUNTER — Encounter (HOSPITAL_COMMUNITY): Payer: Self-pay | Admitting: Hematology

## 2018-03-26 ENCOUNTER — Inpatient Hospital Stay (HOSPITAL_COMMUNITY): Payer: PPO

## 2018-03-26 ENCOUNTER — Other Ambulatory Visit: Payer: Self-pay

## 2018-03-26 VITALS — BP 116/72 | HR 106 | Temp 97.7°F | Resp 18 | Wt 219.0 lb

## 2018-03-26 DIAGNOSIS — C3492 Malignant neoplasm of unspecified part of left bronchus or lung: Secondary | ICD-10-CM

## 2018-03-26 DIAGNOSIS — E669 Obesity, unspecified: Secondary | ICD-10-CM | POA: Diagnosis not present

## 2018-03-26 DIAGNOSIS — F319 Bipolar disorder, unspecified: Secondary | ICD-10-CM

## 2018-03-26 DIAGNOSIS — R1310 Dysphagia, unspecified: Secondary | ICD-10-CM

## 2018-03-26 DIAGNOSIS — Z5111 Encounter for antineoplastic chemotherapy: Secondary | ICD-10-CM

## 2018-03-26 DIAGNOSIS — G8929 Other chronic pain: Secondary | ICD-10-CM

## 2018-03-26 DIAGNOSIS — Z79899 Other long term (current) drug therapy: Secondary | ICD-10-CM

## 2018-03-26 DIAGNOSIS — Z808 Family history of malignant neoplasm of other organs or systems: Secondary | ICD-10-CM

## 2018-03-26 DIAGNOSIS — I10 Essential (primary) hypertension: Secondary | ICD-10-CM

## 2018-03-26 DIAGNOSIS — F17210 Nicotine dependence, cigarettes, uncomplicated: Secondary | ICD-10-CM

## 2018-03-26 DIAGNOSIS — C3412 Malignant neoplasm of upper lobe, left bronchus or lung: Secondary | ICD-10-CM

## 2018-03-26 DIAGNOSIS — Z7982 Long term (current) use of aspirin: Secondary | ICD-10-CM

## 2018-03-26 DIAGNOSIS — K219 Gastro-esophageal reflux disease without esophagitis: Secondary | ICD-10-CM | POA: Diagnosis not present

## 2018-03-26 DIAGNOSIS — Z87891 Personal history of nicotine dependence: Secondary | ICD-10-CM | POA: Diagnosis not present

## 2018-03-26 DIAGNOSIS — Z801 Family history of malignant neoplasm of trachea, bronchus and lung: Secondary | ICD-10-CM

## 2018-03-26 DIAGNOSIS — E785 Hyperlipidemia, unspecified: Secondary | ICD-10-CM | POA: Diagnosis not present

## 2018-03-26 DIAGNOSIS — Z800 Family history of malignant neoplasm of digestive organs: Secondary | ICD-10-CM

## 2018-03-26 LAB — CBC WITH DIFFERENTIAL/PLATELET
BASOS PCT: 1 %
Basophils Absolute: 0 10*3/uL (ref 0.0–0.1)
EOS ABS: 0 10*3/uL (ref 0.0–0.7)
EOS PCT: 1 %
HCT: 27.9 % — ABNORMAL LOW (ref 36.0–46.0)
Hemoglobin: 8.4 g/dL — ABNORMAL LOW (ref 12.0–15.0)
Lymphocytes Relative: 11 %
Lymphs Abs: 0.2 10*3/uL — ABNORMAL LOW (ref 0.7–4.0)
MCH: 26.3 pg (ref 26.0–34.0)
MCHC: 30.1 g/dL (ref 30.0–36.0)
MCV: 87.2 fL (ref 78.0–100.0)
MONO ABS: 0.2 10*3/uL (ref 0.1–1.0)
MONOS PCT: 10 %
NEUTROS PCT: 77 %
Neutro Abs: 1.3 10*3/uL — ABNORMAL LOW (ref 1.7–7.7)
PLATELETS: 140 10*3/uL — AB (ref 150–400)
RBC: 3.2 MIL/uL — ABNORMAL LOW (ref 3.87–5.11)
RDW: 24.5 % — AB (ref 11.5–15.5)
WBC: 1.7 10*3/uL — ABNORMAL LOW (ref 4.0–10.5)

## 2018-03-26 LAB — COMPREHENSIVE METABOLIC PANEL
ALT: 20 U/L (ref 0–44)
ANION GAP: 10 (ref 5–15)
AST: 22 U/L (ref 15–41)
Albumin: 3.4 g/dL — ABNORMAL LOW (ref 3.5–5.0)
Alkaline Phosphatase: 60 U/L (ref 38–126)
BUN: 18 mg/dL (ref 8–23)
CALCIUM: 8.9 mg/dL (ref 8.9–10.3)
CHLORIDE: 102 mmol/L (ref 98–111)
CO2: 24 mmol/L (ref 22–32)
Creatinine, Ser: 0.81 mg/dL (ref 0.44–1.00)
GFR calc non Af Amer: >60 mL/min (ref >60)
Glucose, Bld: 109 mg/dL — ABNORMAL HIGH (ref 70–99)
Potassium: 4.4 mmol/L (ref 3.5–5.1)
SODIUM: 136 mmol/L (ref 135–145)
Total Bilirubin: 0.8 mg/dL (ref 0.3–1.2)
Total Protein: 6.2 g/dL — ABNORMAL LOW (ref 6.5–8.1)

## 2018-03-26 MED ORDER — MORPHINE SULFATE (CONCENTRATE) 10 MG /0.5 ML PO SOLN: 10.0000 mg | Freq: Four times a day (QID) | ORAL | 0 refills | Status: DC | PRN

## 2018-03-26 NOTE — Progress Notes (Signed)
[address] [place], [place] [address]   CLINIC:  Medical Oncology/Hematology  PCP:  Fayrene Helper, MD [address] [place] [address] [phone]   REASON FOR VISIT:  Follow-up for squamous cell lung cancer  CURRENT THERAPY: Completed radiation and chemo   BRIEF ONCOLOGIC HISTORY:    Squamous cell lung cancer, left ([place])   10/28/2017 Imaging    CT scan of the chest shows left hilar mass measuring 3.4 x 2.9 cm, multiple soft tissue masses in the left upper lobe, largest measuring 1.6 cm  MRI of the brain on 10/31/2017 shows dural based focus of contrast enhancement along the superior left convexity, meningioma favored as it was present on MRI in 2009    10/30/2017 Initial Biopsy    Bronchoscopy and biopsy consistent with squamous cell cancer of the lung    11/08/2017 Family History    2 sisters died of stomach cancer Father died of lung cancer in a smoker Maternal aunt died of thyroid cancer Maternal grandfather had cancer spread to the bone    02/05/2018 -  Chemotherapy    The patient had palonosetron (ALOXI) injection 0.25 mg, 0.25 mg, Intravenous,  Once, 6 of 6 cycles Administration: 0.25 mg (02/11/2018), 0.25 mg (02/18/2018), 0.25 mg (02/25/2018), 0.25 mg (03/04/2018), 0.25 mg (03/12/2018), 0.25 mg (03/19/2018) CARBOplatin (PARAPLATIN) 220 mg in sodium chloride 0.9 % 250 mL chemo infusion, 220 mg (100 % of original dose 215.8 mg), Intravenous,  Once, 6 of 6 cycles Dose modification:   (original dose 215.8 mg, Cycle 1),   (original dose 215.8 mg, Cycle 2), 215.8 mg (original dose 215.8 mg, Cycle 3),   (original dose 215.8 mg, Cycle 4) Administration: 220 mg (02/11/2018), 220 mg (02/18/2018), 220 mg (02/25/2018), 220 mg (03/04/2018), 220 mg (03/12/2018) PACLitaxel (TAXOL) 96 mg in sodium chloride 0.9 % 250 mL chemo infusion (</= 79m/m2), 45 mg/m2 = 96 mg, Intravenous,  Once, 6 of 6 cycles Administration: 96 mg (02/11/2018), 96 mg (02/18/2018), 96  mg (02/25/2018), 96 mg (03/04/2018), 96 mg (03/12/2018), 96 mg (03/19/2018)  for chemotherapy treatment.        INTERVAL HISTORY:  Ms. [id number]. female returns for routine follow-up for squamous cell lung cancer. Patient has completed all cycles of chemo and radiation. She has done very well. She is having trouble swallowing due to the constant pain. She is using lidocaine that was prescribed however she is still in pain. She is more fatigued throughout the day. Patient reports her appetite and energy levels are at 75%. She is drinking 2 boosts daily to help maintain her weight. She is trying to remain active during the day. She lives at home with her husband and performs all her own ADLs with no issues.      REVIEW OF SYSTEMS:  Review of Systems  Constitutional: Positive for fatigue.  HENT:   Positive for trouble swallowing.   All other systems reviewed and are negative.    PAST MEDICAL/SURGICAL HISTORY:  Past Medical History:  Diagnosis Date  . Anxiety   . Arthritis   . Bipolar disorder ([facility]   . Chronic back pain   . COPD (chronic obstructive pulmonary disease) ([facility]   . Fracture of left ankle Jan 06, 2010   hairline/ Dr. KAlfonso Ramusis treating   . GERD (gastroesophageal reflux disease) 2000  . Hyperlipidemia 1995  . Hypertension 1995  . Nicotine addiction   . Obesity    Past Surgical History:  Procedure Laterality Date  . BACK SURGERY  1999  . BRONCHIAL BRUSHINGS Left 10/30/2017   Procedure: BRONCHIAL BRUSHINGS;  Surgeon: [name], MD;  Location: [facility];  Service: Cardiopulmonary;  Laterality: Left;  . BRONCHIAL WASHINGS Left 10/30/2017   Procedure: BRONCHIAL WASHINGS;  Surgeon: [name], MD;  Location: [facility];  Service: Cardiopulmonary;  Laterality: Left;  . CHOLECYSTECTOMY  2001  . COLONOSCOPY N/A 02/02/2016   Procedure: COLONOSCOPY;  Surgeon: [name], MD;  Location: [facility];  Service: Endoscopy;  Laterality: N/A;  930  . FLEXIBLE  BRONCHOSCOPY Bilateral 10/30/2017   Procedure: FLEXIBLE BRONCHOSCOPY;  Surgeon: [name], MD;  Location: [facility];  Service: Cardiopulmonary;  Laterality: Bilateral;  . INCISIONAL HERNIA REPAIR  August 17, 2008  . left inguinal hernia herniorrhapy  1980  . PORTACATH PLACEMENT Left 02/03/2018   Procedure: INSERTION PORT-A-CATH;  Surgeon: [name], MD;  Location: [facility];  Service: General;  Laterality: Left;  . removal of thmoma  03/27/2010   Dr. Arlyce Dice   . SPINE SURGERY    . TONSILLECTOMY    . TOTAL ABDOMINAL HYSTERECTOMY W/ BILATERAL SALPINGOOPHORECTOMY  1984     SOCIAL HISTORY:  Social History   Socioeconomic History  . Marital status: Married    Spouse name: Not on file  . Number of children: Not on file  . Years of education: Not on file  . Highest education level: Not on file  Occupational History  . 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 Financial resource strain: Not hard at all  . Food insecurity:    Worry: Never true    Inability: Never true  . Transportation needs:    Medical: No    Non-medical: No  Tobacco Use  . Smoking status: Former Smoker    Packs/day: 1.50    Years: 54.00    Pack years: 81.00    Types: Cigarettes    Last attempt to quit: 08/20/2017    Years since quitting: 0.5  . Smokeless tobacco: Never Used  Substance and Sexual Activity  . Alcohol use: No    Alcohol/week: 0.0 standard drinks  . Drug use: No  . Sexual activity: Not Currently  Lifestyle  . Physical activity:    Days per week: 0 days    Minutes per session: 0 min  . Stress: Only a little  Relationships  . Social connections:    Talks on phone: More than three times a week    Gets together: More than three times a week    Attends religious service: More than 4 times per year    Active member of club or organization: No    Attends meetings of clubs or organizations: Never    Relationship status: Married  . Intimate  partner violence:    Fear of current or ex partner: No    Emotionally abused: No    Physically abused: No    Forced sexual activity: No  Other Topics Concern  . Not on file  Social History Narrative   Pt had 2 stillborns     FAMILY HISTORY:  Family History  Problem Relation Age of Onset  . Heart disease Mother        enlarged  . Diabetes Mother   . Hypertension Mother   . Cancer Father        throat  . Hypertension Father   . Coronary artery disease Father   .  Diabetes Sister        x3  . Asthma Sister   . Kidney disease Brother   . Stroke Brother     CURRENT MEDICATIONS:  Outpatient Encounter Medications as of 03/26/2018  Medication Sig  . albuterol (PROAIR HFA) 108 (90 Base) MCG/ACT inhaler INHALE 2 PUFFS BY MOUTH EVERY 6 HOURS IF NEEDED  . ALPRAZolam (XANAX) 1 MG tablet Take 1 tablet (1 mg total) by mouth 2 (two) times daily.  Marland Kitchen aspirin (ASPIRIN LOW DOSE) 81 MG EC tablet Take 81 mg by mouth daily.    . celecoxib (CELEBREX) 400 MG capsule TAKE ONE CAPSULE BY MOUTH ONCE DAILY  . diltiazem (CARDIZEM CD) 120 MG 24 hr capsule TAKE 1 TABLET ONCE DAILY  . docusate sodium (COLACE) 100 MG capsule Take 200 mg by mouth 2 (two) times daily.  Marland Kitchen EPINEPHrine 0.3 mg/0.3 mL IJ SOAJ injection Inject 0.3 mg into the muscle once.  Marland Kitchen FLUoxetine (PROZAC) 20 MG capsule TAKE 1 TABLET ONCE DAILY  . furosemide (LASIX) 20 MG tablet Take 1 tablet (20 mg total) by mouth 2 (two) times daily.  Marland Kitchen guaiFENesin (MUCINEX) 600 MG 12 hr tablet Take 1 tablet (600 mg total) by mouth 2 (two) times daily.  Marland Kitchen HYDROcodone-acetaminophen (NORCO/VICODIN) 5-325 MG tablet TK 1 T PO TID FOR CHRONIC BACK PAIN  . lactulose (CHRONULAC) 10 GM/15ML solution Take 15-52m at bedtime every night to assist with moving bowels.  Titrate down if having multiple bowel movements.  . lidocaine (XYLOCAINE) 2 % solution   . lidocaine-prilocaine (EMLA) cream Apply a quarter size amount to port site 1 hour prior to chemo. Do not rub in.  Cover with plastic wrap.  . lisinopril (PRINIVIL,ZESTRIL) 10 MG tablet Take 1 tablet (10 mg total) by mouth daily.  .Marland Kitchenomeprazole (PRILOSEC) 40 MG capsule TAKE 1 CAPSULE ONCE DAILY  . potassium chloride SA (K-DUR,KLOR-CON) 20 MEQ tablet Take 1 tablet (20 mEq total) by mouth daily.  . prochlorperazine (COMPAZINE) 10 MG tablet Take 10 mg by mouth every 6 (six) hours as needed for nausea or vomiting.  . rosuvastatin (CRESTOR) 20 MG tablet Take 1 tablet (20 mg total) by mouth daily.  . SYMBICORT 80-4.5 MCG/ACT inhaler INHALE 2 PUFFS BY MOUTH TWICE DAILY. RINSE MOUTH AFTER USE  . tiZANidine (ZANAFLEX) 4 MG tablet Take 1 tablet (4 mg total) by mouth 3 (three) times daily.  . Morphine Sulfate (MORPHINE CONCENTRATE) 10 mg / 0.5 ml concentrated solution Take 0.5 mLs (10 mg total) by mouth every 6 (six) hours as needed for severe pain.   No facility-administered encounter medications on file as of 03/26/2018.     ALLERGIES:  Allergies  Allergen Reactions  . Bee Venom Anaphylaxis  . Codeine   . Penicillins Other (See Comments)    Blisters in mouth after dental work Has patient had a PCN reaction causing immediate rash, facial/tongue/throat swelling, SOB or lightheadedness with hypotension: Yes Has patient had a PCN reaction causing severe rash involving mucus membranes or skin necrosis: No Has patient had a PCN reaction that required hospitalization No Has patient had a PCN reaction occurring within the last 10 years: Yes If all of the above answers are "NO", then may proceed with Cephalosporin use.      PHYSICAL EXAM:  ECOG Performance status: 1  Vitals:   03/26/18 1052  BP: 116/72  Pulse: (!) 106  Resp: 18  Temp: 97.7 F (36.5 C)  SpO2: 95%   Filed Weights   03/26/18 1052  Weight: 219 lb (99.3 kg)    Physical Exam  Constitutional: She is oriented to person, place, and time. She appears well-developed and well-nourished.  Cardiovascular: Normal rate, regular rhythm and normal  heart sounds.  Pulmonary/Chest: Effort normal and breath sounds normal.  Neurological: She is alert and oriented to person, place, and time.  Skin: Skin is warm and dry.     LABORATORY DATA:  I have reviewed the labs as listed.  CBC    Component Value Date/Time   WBC 1.7 (L) 03/26/2018 1022   RBC 3.20 (L) 03/26/2018 1022   HGB 8.4 (L) 03/26/2018 1022   HCT 27.9 (L) 03/26/2018 1022   PLT 140 (L) 03/26/2018 1022   MCV 87.2 03/26/2018 1022   MCH 26.3 03/26/2018 1022   MCHC 30.1 03/26/2018 1022   RDW 24.5 (H) 03/26/2018 1022   LYMPHSABS 0.2 (L) 03/26/2018 1022   MONOABS 0.2 03/26/2018 1022   EOSABS 0.0 03/26/2018 1022   BASOSABS 0.0 03/26/2018 1022   CMP Latest Ref Rng & Units 03/26/2018 03/21/2018 03/19/2018  Glucose 70 - 99 mg/dL 109(H) 120(H) 135(H)  BUN 8 - 23 mg/dL 18 19 24(H)  Creatinine 0.44 - 1.00 mg/dL 0.81 0.82 0.79  Sodium 135 - 145 mmol/L 136 137 136  Potassium 3.5 - 5.1 mmol/L 4.4 4.4 4.7  Chloride 98 - 111 mmol/L 102 101 103  CO2 22 - 32 mmol/L _0 Calcium 8.9 - 10.3 mg/dL 8.9 9.1 9.1  Total Protein 6.5 - 8.1 g/dL 6.2(L) 7.3 7.0  Total Bilirubin 0.3 - 1.2 mg/dL 0.8 0.6 0.5  Alkaline Phos 38 - 126 U/L 60 66 74  AST 15 - 41 U/L _1 ALT 0 - 44 U/L _2 ASSESSMENT & PLAN:   Squamous cell lung cancer, left ([facility]) 1.  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  She was evaluated by Dr.  Roxan Hockey on 01/08/2018 and felt not to be a surgical candidate. - Started on chemoradiation therapy with weekly carboplatin and paclitaxel on 02/11/2018. - She had trouble with swallowing and retrosternal pain over the weekend.  [facility] mouthwash did not help.  Dr.Yanagihara gave her lidocaine 15 mL every 4 hours which is helping.  She is able to eat well now. - She completed final chemotherapy on 03/19/2018.  She completed radiation therapy on 03/25/2018.  I have discussed the blood work from today which showed low white count of 1.7, ANC was 1300.  She is having a lot of pain while swallowing.  I have suggested her to use morphine liquid every 4 hours as needed and prior to eating.  We will send in the prescription to her pharmacy. - I have recommended consolidation immunotherapy with durvalumab every 2 weeks for 26 treatments.  This will be started 6 weeks from today.  We plan to arrange CT scan of the chest with contrast prior to her next visit in  6 weeks.   2.  Family history: The 2 of her sisters died of stomach cancer.  Father died of lung cancer.  Maternal aunt died of thyroid cancer.  Maternal grandfather had cancer in the bone.  Primary is unknown.  We will refer her to our geneticist.       Orders placed this encounter:  Orders Placed This Encounter  Procedures  . CT Chest W Contrast  . CBC with Differential/Platelet  . Comprehensive metabolic panel  . Ferritin  . Iron and TIBC  . Vitamin B12  . Folate      Derek Jack, MD [place] [phone]

## 2018-03-26 NOTE — Patient Instructions (Signed)
[name] at [facility] Discharge Instructions  Follow up in 6 weeks with labs and beginning immunotherapy treatment. You will have repeat CT scans in 5 weeks. We will also make you a genetic counselor appointment as well.    Thank you for choosing [place] at [facility] to provide your oncology and hematology care.  To afford each patient quality time with our provider, please arrive at least 15 minutes before your scheduled appointment time.   If you have a lab appointment with the [facility] please come in thru the  Main Entrance and check in at the main information desk  You need to re-schedule your appointment should you arrive 10 or more minutes late.  We strive to give you quality time with our providers, and arriving late affects you and other patients whose appointments are after yours.  Also, if you no show three or more times for appointments you may be dismissed from the clinic at the providers discretion.     Again, thank you for choosing [facility].  Our hope is that these requests will decrease the amount of time that you wait before being seen by our physicians.       _____________________________________________________________  Should you have questions after your visit to [facility], please contact our office at [phone] between the hours of 8:00 a.m. and 4:30 p.m.  Voicemails left after 4:00 p.m. will not be returned until the following business day.  For prescription refill requests, have your pharmacy contact our office and allow 72 hours.    Cancer Center Support Programs:   > Cancer Support Group  2nd Tuesday of the month 1pm-2pm, Journey Room

## 2018-03-26 NOTE — Assessment & Plan Note (Signed)
1.  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  She was evaluated by Dr. Roxan Hockey on 01/08/2018 and felt not to be a surgical candidate. - Started on chemoradiation therapy with weekly carboplatin and paclitaxel on 02/11/2018. - She had trouble with swallowing and retrosternal pain over the weekend.  Dukes mouthwash did not help.  Dr.Yanagihara gave her lidocaine 15 mL every 4 hours which is helping.  She is able to eat well now. - She completed final chemotherapy on 03/19/2018.  She completed radiation therapy on 03/25/2018.  I have discussed the blood work from today which showed low white count of 1.7, ANC was 1300.  She is having a lot of pain while swallowing.  I have suggested her to use morphine liquid every 4 hours as needed and prior to eating.  We will send in the prescription to her pharmacy. - I have recommended consolidation immunotherapy with durvalumab every 2 weeks for 26 treatments.  This will be started 6 weeks from today.  We plan to arrange CT scan of the chest with contrast prior to her next visit in 6 weeks.   2.  Family history: The 2 of her sisters died of stomach cancer.  Father died of lung cancer.  Maternal aunt died of thyroid cancer.  Maternal grandfather had cancer in the bone.  Primary is unknown.  We will refer her to our geneticist.

## 2018-04-02 ENCOUNTER — Other Ambulatory Visit: Payer: Self-pay | Admitting: Family Medicine

## 2018-04-04 ENCOUNTER — Other Ambulatory Visit (HOSPITAL_COMMUNITY): Payer: Self-pay | Admitting: Hematology

## 2018-04-04 ENCOUNTER — Other Ambulatory Visit: Payer: Self-pay | Admitting: Family Medicine

## 2018-04-04 NOTE — Progress Notes (Signed)
ON PATHWAY REGIMEN - Non-Small Cell Lung  No Change  Continue With Treatment as Ordered.     Administer weekly:     Paclitaxel      Carboplatin   **Always confirm dose/schedule in your pharmacy ordering system**  Patient Characteristics: Stage III - Unresectable, PS = 0, 1 AJCC T Category: T3 Current Disease Status: No Distant Mets or Local Recurrence AJCC N Category: N1 AJCC M Category: M0 AJCC 8 Stage Grouping: IIIA Performance Status: PS = 0, 1 Intent of Therapy: Curative Intent, Discussed with Patient

## 2018-04-08 ENCOUNTER — Other Ambulatory Visit (HOSPITAL_COMMUNITY): Payer: Self-pay | Admitting: *Deleted

## 2018-04-08 ENCOUNTER — Other Ambulatory Visit (HOSPITAL_COMMUNITY): Payer: Self-pay | Admitting: Nurse Practitioner

## 2018-04-08 DIAGNOSIS — C3492 Malignant neoplasm of unspecified part of left bronchus or lung: Secondary | ICD-10-CM

## 2018-04-08 MED ORDER — MORPHINE SULFATE (CONCENTRATE) 10 MG /0.5 ML PO SOLN: 10.0000 mg | Freq: Four times a day (QID) | ORAL | 0 refills | Status: DC | PRN

## 2018-04-09 ENCOUNTER — Other Ambulatory Visit (HOSPITAL_COMMUNITY): Payer: Self-pay | Admitting: Nurse Practitioner

## 2018-04-09 ENCOUNTER — Other Ambulatory Visit (HOSPITAL_COMMUNITY): Payer: Self-pay | Admitting: *Deleted

## 2018-04-09 DIAGNOSIS — C3492 Malignant neoplasm of unspecified part of left bronchus or lung: Secondary | ICD-10-CM

## 2018-04-09 MED ORDER — MORPHINE SULFATE (CONCENTRATE) 10 MG /0.5 ML PO SOLN: 10.0000 mg | Freq: Four times a day (QID) | ORAL | 0 refills | Status: DC | PRN

## 2018-04-10 MED ORDER — MORPHINE SULFATE (CONCENTRATE) 10 MG /0.5 ML PO SOLN: 10.0000 mg | Freq: Four times a day (QID) | ORAL | 0 refills | Status: DC | PRN

## 2018-04-10 NOTE — Addendum Note (Signed)
Addended by: Farley Ly on: 04/10/2018 10:25 AM   Modules accepted: Orders

## 2018-04-23 DIAGNOSIS — Z7982 Long term (current) use of aspirin: Secondary | ICD-10-CM | POA: Diagnosis not present

## 2018-04-23 DIAGNOSIS — K219 Gastro-esophageal reflux disease without esophagitis: Secondary | ICD-10-CM | POA: Diagnosis not present

## 2018-04-23 DIAGNOSIS — E785 Hyperlipidemia, unspecified: Secondary | ICD-10-CM | POA: Diagnosis not present

## 2018-04-23 DIAGNOSIS — F419 Anxiety disorder, unspecified: Secondary | ICD-10-CM | POA: Diagnosis not present

## 2018-04-23 DIAGNOSIS — Z79891 Long term (current) use of opiate analgesic: Secondary | ICD-10-CM | POA: Diagnosis not present

## 2018-04-23 DIAGNOSIS — Z833 Family history of diabetes mellitus: Secondary | ICD-10-CM | POA: Diagnosis not present

## 2018-04-23 DIAGNOSIS — Z79899 Other long term (current) drug therapy: Secondary | ICD-10-CM | POA: Diagnosis not present

## 2018-04-23 DIAGNOSIS — Z809 Family history of malignant neoplasm, unspecified: Secondary | ICD-10-CM | POA: Diagnosis not present

## 2018-04-23 DIAGNOSIS — I10 Essential (primary) hypertension: Secondary | ICD-10-CM | POA: Diagnosis not present

## 2018-04-23 DIAGNOSIS — G939 Disorder of brain, unspecified: Secondary | ICD-10-CM | POA: Diagnosis not present

## 2018-04-23 DIAGNOSIS — Z87891 Personal history of nicotine dependence: Secondary | ICD-10-CM | POA: Diagnosis not present

## 2018-04-23 DIAGNOSIS — M1990 Unspecified osteoarthritis, unspecified site: Secondary | ICD-10-CM | POA: Diagnosis not present

## 2018-04-23 DIAGNOSIS — Z7951 Long term (current) use of inhaled steroids: Secondary | ICD-10-CM | POA: Diagnosis not present

## 2018-04-23 DIAGNOSIS — C3412 Malignant neoplasm of upper lobe, left bronchus or lung: Secondary | ICD-10-CM | POA: Diagnosis not present

## 2018-04-23 DIAGNOSIS — J449 Chronic obstructive pulmonary disease, unspecified: Secondary | ICD-10-CM | POA: Diagnosis not present

## 2018-04-24 ENCOUNTER — Other Ambulatory Visit (HOSPITAL_COMMUNITY): Payer: Self-pay

## 2018-04-24 ENCOUNTER — Encounter (HOSPITAL_COMMUNITY): Payer: Self-pay | Admitting: Genetic Counselor

## 2018-04-24 ENCOUNTER — Inpatient Hospital Stay (HOSPITAL_COMMUNITY): Payer: PPO | Attending: Hematology | Admitting: Genetic Counselor

## 2018-04-24 DIAGNOSIS — E669 Obesity, unspecified: Secondary | ICD-10-CM | POA: Insufficient documentation

## 2018-04-24 DIAGNOSIS — F319 Bipolar disorder, unspecified: Secondary | ICD-10-CM | POA: Insufficient documentation

## 2018-04-24 DIAGNOSIS — Z5112 Encounter for antineoplastic immunotherapy: Secondary | ICD-10-CM | POA: Insufficient documentation

## 2018-04-24 DIAGNOSIS — Z801 Family history of malignant neoplasm of trachea, bronchus and lung: Secondary | ICD-10-CM | POA: Diagnosis not present

## 2018-04-24 DIAGNOSIS — E785 Hyperlipidemia, unspecified: Secondary | ICD-10-CM | POA: Insufficient documentation

## 2018-04-24 DIAGNOSIS — Z7982 Long term (current) use of aspirin: Secondary | ICD-10-CM | POA: Insufficient documentation

## 2018-04-24 DIAGNOSIS — Z23 Encounter for immunization: Secondary | ICD-10-CM | POA: Insufficient documentation

## 2018-04-24 DIAGNOSIS — C3412 Malignant neoplasm of upper lobe, left bronchus or lung: Secondary | ICD-10-CM | POA: Diagnosis not present

## 2018-04-24 DIAGNOSIS — Z800 Family history of malignant neoplasm of digestive organs: Secondary | ICD-10-CM

## 2018-04-24 DIAGNOSIS — Z8042 Family history of malignant neoplasm of prostate: Secondary | ICD-10-CM | POA: Diagnosis not present

## 2018-04-24 DIAGNOSIS — Z7183 Encounter for nonprocreative genetic counseling: Secondary | ICD-10-CM | POA: Diagnosis not present

## 2018-04-24 DIAGNOSIS — I10 Essential (primary) hypertension: Secondary | ICD-10-CM | POA: Insufficient documentation

## 2018-04-24 DIAGNOSIS — Z808 Family history of malignant neoplasm of other organs or systems: Secondary | ICD-10-CM | POA: Insufficient documentation

## 2018-04-24 DIAGNOSIS — Z87891 Personal history of nicotine dependence: Secondary | ICD-10-CM | POA: Insufficient documentation

## 2018-04-24 DIAGNOSIS — M549 Dorsalgia, unspecified: Secondary | ICD-10-CM | POA: Insufficient documentation

## 2018-04-24 DIAGNOSIS — K219 Gastro-esophageal reflux disease without esophagitis: Secondary | ICD-10-CM | POA: Insufficient documentation

## 2018-04-24 DIAGNOSIS — Z9221 Personal history of antineoplastic chemotherapy: Secondary | ICD-10-CM | POA: Insufficient documentation

## 2018-04-24 DIAGNOSIS — Z79899 Other long term (current) drug therapy: Secondary | ICD-10-CM | POA: Insufficient documentation

## 2018-04-24 DIAGNOSIS — J449 Chronic obstructive pulmonary disease, unspecified: Secondary | ICD-10-CM | POA: Insufficient documentation

## 2018-04-24 NOTE — Progress Notes (Signed)
REFERRING PROVIDER: Derek Jack, MD [address] [place], [place] [address]  PRIMARY PROVIDER:  Fayrene Helper, MD  PRIMARY REASON FOR VISIT:  1. Malignant neoplasm of upper lobe of left lung ([place])   2. Family history of prostate cancer   3. Family history of stomach cancer   4. Family history of lung cancer      HISTORY OF PRESENT ILLNESS:   [name], a 73 y.o. female, was seen for a [place] cancer genetics consultation at the request of Dr. Delton Coombes due to a personal and family history of cancer.  [name] presents to clinic today to discuss the possibility of a hereditary predisposition to cancer, genetic testing, and to further clarify her future cancer risks, as well as potential cancer risks for family members.   In 2019, at the age of 51, [name] was diagnosed with cancer of the upper lobe of the left lung. This was treated with chemotherapy and radiation.  [name] reports being a smoker for 52 years, smoking on average 97 pack per day.   CANCER HISTORY:    Squamous cell lung cancer, left ([place])   10/28/2017 Imaging    CT scan of the chest shows left hilar mass measuring 3.4 x 2.9 cm, multiple soft tissue masses in the left upper lobe, largest measuring 1.6 cm  MRI of the brain on 10/31/2017 shows dural based focus of contrast enhancement along the superior left convexity, meningioma favored as it was present on MRI in 2009    10/30/2017 Initial Biopsy    Bronchoscopy and biopsy consistent with squamous cell cancer of the lung    11/08/2017 Family History    2 sisters died of stomach cancer Father died of lung cancer in a smoker Maternal aunt died of thyroid cancer Maternal grandfather had cancer spread to the bone    02/05/2018 - 03/25/2018 Chemotherapy    The patient had palonosetron (ALOXI) injection 0.25 mg, 0.25 mg, Intravenous,  Once, 6 of 6 cycles Administration: 0.25 mg (02/11/2018), 0.25 mg (02/18/2018), 0.25 mg (02/25/2018), 0.25 mg (03/04/2018), 0.25 mg  (03/12/2018), 0.25 mg (03/19/2018) CARBOplatin (PARAPLATIN) 220 mg in sodium chloride 0.9 % 250 mL chemo infusion, 220 mg (100 % of original dose 215.8 mg), Intravenous,  Once, 6 of 6 cycles Dose modification:   (original dose 215.8 mg, Cycle 1),   (original dose 215.8 mg, Cycle 2), 215.8 mg (original dose 215.8 mg, Cycle 3),   (original dose 215.8 mg, Cycle 4) Administration: 220 mg (02/11/2018), 220 mg (02/18/2018), 220 mg (02/25/2018), 220 mg (03/04/2018), 220 mg (03/12/2018), 220 mg (03/19/2018) PACLitaxel (TAXOL) 96 mg in sodium chloride 0.9 % 250 mL chemo infusion (</= 26m/m2), 45 mg/m2 = 96 mg, Intravenous,  Once, 6 of 6 cycles Administration: 96 mg (02/11/2018), 96 mg (02/18/2018), 96 mg (02/25/2018), 96 mg (03/04/2018), 96 mg (03/12/2018), 96 mg (03/19/2018)  for chemotherapy treatment.       HORMONAL RISK FACTORS:  Menarche was at age 72  First live birth at age 89  OCP use for approximately 5-10 years.  Ovaries intact: no.  Hysterectomy: yes.  Menopausal status: postmenopausal.  HRT use: 0 years. Colonoscopy: yes; 1 polyp. Mammogram within the last year: yes. Number of breast biopsies: 0. Up to date with pelvic exams:  yes. Any excessive radiation exposure in the past:  no  Past Medical History:  Diagnosis Date  . Anxiety   . Arthritis   . Bipolar disorder ([place]   . Chronic back pain   .  COPD (chronic obstructive pulmonary disease) ([place])   . Family history of lung cancer   . Family history of prostate cancer   . Family history of stomach cancer   . 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 GERD (gastroesophageal reflux disease) 2000  . Hyperlipidemia 1995  . Hypertension 1995  . Nicotine addiction   . Obesity     Past Surgical History:  Procedure Laterality Date  . BACK SURGERY  1999  . BRONCHIAL BRUSHINGS Left 10/30/2017   Procedure: BRONCHIAL BRUSHINGS;  Surgeon: [name], MD;  [facility];  Service: Cardiopulmonary;   Laterality: Left;  . BRONCHIAL WASHINGS Left 10/30/2017   Procedure: BRONCHIAL WASHINGS;  Surgeon: [name], MD;  Location: [facility];  Service: Cardiopulmonary;  Laterality: Left;  . CHOLECYSTECTOMY  2001  . COLONOSCOPY N/A 02/02/2016   Procedure: COLONOSCOPY;  Surgeon: [name], MD;  Location: [facility];  Service: Endoscopy;  Laterality: N/A;  930  . FLEXIBLE BRONCHOSCOPY Bilateral 10/30/2017   Procedure: FLEXIBLE BRONCHOSCOPY;  Surgeon: [name], MD;  Location: [facility];  Service: Cardiopulmonary;  Laterality: Bilateral;  . INCISIONAL HERNIA REPAIR  August 17, 2008  . left inguinal hernia herniorrhapy  1980  . PORTACATH PLACEMENT Left 02/03/2018   Procedure: INSERTION PORT-A-CATH;  Surgeon: [name], MD;  Location: [facility];  Service: General;  Laterality: Left;  . removal of thmoma  03/27/2010   Dr. Arlyce Dice   . SPINE SURGERY    . TONSILLECTOMY    . TOTAL ABDOMINAL HYSTERECTOMY W/ BILATERAL SALPINGOOPHORECTOMY  1984    Social History   Socioeconomic History  . Marital status: Married    Spouse name: Not on file  . Number of children: Not on file  . Years of education: Not on file  . Highest education level: Not on file  Occupational History  . 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 Financial resource strain: Not hard at all  . Food insecurity:    Worry: Never true    Inability: Never true  . Transportation needs:    Medical: No    Non-medical: No  Tobacco Use  . Smoking status: Former Smoker    Packs/day: 1.50    Years: 54.00    Pack years: 81.00    Types: Cigarettes    Last attempt to quit: 08/20/2017    Years since quitting: 0.6  . Smokeless tobacco: Never Used  Substance and Sexual Activity  . Alcohol use: No    Alcohol/week: 0.0 standard drinks  . Drug use: No  . Sexual activity: Not Currently  Lifestyle  . Physical activity:    Days per week: 0 days    Minutes per session: 0  min  . Stress: Only a little  Relationships  . Social connections:    Talks on phone: More than three times a week    Gets together: More than three times a week    Attends religious service: More than 4 times per year    Active member of club or organization: No    Attends meetings of clubs or organizations: Never    Relationship status: Married  Other Topics Concern  . Not on file  Social History Narrative   Pt had 2 stillborns      FAMILY HISTORY:  We obtained a detailed, 4-generation family history.  Significant diagnoses are listed below: Family  History  Problem Relation Age of Onset  . Heart disease Mother        enlarged  . Diabetes Mother   . Hypertension Mother   . Cancer Father        throat cancer - smoker  . Hypertension Father   . Coronary artery disease Father   . Diabetes Sister        x3  . Asthma Sister   . Lung cancer Sister 18       d. 16; smoker  . Kidney disease Brother   . Stomach cancer Sister        dx in her 64s-50s  . Stroke Brother   . Prostate cancer Brother 41       pat 1/2 brother  . Thyroid cancer Maternal Aunt        dx and d. in her 63s  . Bone cancer Maternal Uncle   . Bone cancer Maternal Grandfather   . Cancer Cousin        NOS - had a swollen stomach    The patient has three sons.  Her first son was stillborn at 41 months.  She has one full sister, one paternal half brother and a brother and two sisters who are maternal half siblings.  Her paternal half brother had prostate cancer, a half sister had lung cancer and the other half sister had stomach cancer in her 68's.  Both parents are deceased.  The patient's father had throat cancer and was as smoker.  He had three sisters and a brother who were cancer free.  His parents are deceased from non cancer related issues.  The patient's mother died of a heart attack.  She had two brothers and a sister.  The sister had thyroid cancer and a brother had bone cancer.  The other brother had  a son who died of an unknown cancer and had a 'swollen stomach'.  The maternal grandparents are deceased.  The grandfather had bone cancer.  [name] is unaware of previous family history of genetic testing for hereditary cancer risks. Patient's maternal ancestors are of [place] American and native [place] descent, and paternal ancestors are of [place] American descent. There is no reported Ashkenazi Jewish ancestry. There is no known consanguinity.  GENETIC COUNSELING ASSESSMENT: [name] is a 72 y.o. female with a personal history of lung cancer and family history of cancer which is somewhat suggestive of a sporadic cancer rather than a hereditary predisposition to cancer. We, therefore, discussed and recommended the following at today's visit.   DISCUSSION: We discussed that about 5-10% of cancer is hereditary with most cases being sporadic.  We discussed that the bone cancer in her grandfather and maternal uncle could be suspicious for metastatic prostate cancer; however, her family history is not strong enough to warrant testing.    We discussed with [name] that the family history is not highly consistent with a familial hereditary cancer syndrome, and we feel she is at low risk to harbor a gene mutation associated with such a condition. Thus, we did not recommend any genetic testing, at this time, and recommended [name] continue to follow the cancer screening guidelines given by her primary healthcare provider.  We discussed that sometimes patients are still interested in genetic testing, even if they do not have a highly suspicious family history.  If this is the case we can offer genetic testing for an out of pocket cost of $250.  PLAN: At this  time we do not have a recommendation for genetic testing.  Therefore,  [name] did not wish to pursue genetic testing at today's visit. We understand this decision, and remain available to coordinate genetic testing at any time in the future.  We, therefore, recommend [name] continue to follow the cancer screening guidelines given by her primary healthcare provider.  Lastly, we encouraged [name] to remain in contact with cancer genetics annually so that we can continuously update the family history and inform her of any changes in cancer genetics and testing that may be of benefit for this family.   Ms.  Kramer questions were answered to her satisfaction today. Our contact information was provided should additional questions or concerns arise. Thank you for the referral and allowing [place] to share in the care of your patient.   Roshonda Sperl P. [place]ne [place], [place], [facility] Certified Genetic Counselor [email or web address] phone: [phone]  The patient was seen for a total of 30 minutes in face-to-face genetic counseling.  This patient was discussed with Drs. Magrinat, Lindi Adie and/or Burr Medico who agrees with the above.    _______________________________________________________________________ For Office Staff:  Number of people involved in session: 2 Was an Intern/ student involved with case: no

## 2018-04-30 ENCOUNTER — Ambulatory Visit (HOSPITAL_COMMUNITY)
Admission: RE | Admit: 2018-04-30 | Discharge: 2018-04-30 | Disposition: A | Discharge status: Home / Self Care | Payer: PPO | Source: Ambulatory Visit | Attending: Nurse Practitioner | Admitting: Nurse Practitioner

## 2018-04-30 ENCOUNTER — Other Ambulatory Visit: Payer: Self-pay | Admitting: Family Medicine

## 2018-04-30 DIAGNOSIS — Z5111 Encounter for antineoplastic chemotherapy: Secondary | ICD-10-CM | POA: Diagnosis not present

## 2018-04-30 DIAGNOSIS — E041 Nontoxic single thyroid nodule: Secondary | ICD-10-CM | POA: Diagnosis not present

## 2018-04-30 DIAGNOSIS — I700 Atherosclerosis of aorta: Secondary | ICD-10-CM | POA: Diagnosis not present

## 2018-04-30 DIAGNOSIS — C3492 Malignant neoplasm of unspecified part of left bronchus or lung: Secondary | ICD-10-CM | POA: Diagnosis not present

## 2018-04-30 DIAGNOSIS — J439 Emphysema, unspecified: Secondary | ICD-10-CM | POA: Insufficient documentation

## 2018-04-30 DIAGNOSIS — C3412 Malignant neoplasm of upper lobe, left bronchus or lung: Secondary | ICD-10-CM | POA: Diagnosis not present

## 2018-04-30 MED ORDER — IOHEXOL 300 MG/ML  SOLN
75.0000 mL | Freq: Once | INTRAMUSCULAR | Status: AC | PRN
  Administered 2018-04-30: 75 mL via INTRAVENOUS

## 2018-05-01 ENCOUNTER — Ambulatory Visit (INDEPENDENT_AMBULATORY_CARE_PROVIDER_SITE_OTHER): Payer: PPO | Admitting: Family Medicine

## 2018-05-01 ENCOUNTER — Encounter: Payer: Self-pay | Admitting: Family Medicine

## 2018-05-01 VITALS — BP 128/74 | HR 96 | Resp 16 | Ht 64.0 in | Wt 216.0 lb

## 2018-05-01 DIAGNOSIS — G8929 Other chronic pain: Secondary | ICD-10-CM

## 2018-05-01 DIAGNOSIS — F325 Major depressive disorder, single episode, in full remission: Secondary | ICD-10-CM | POA: Diagnosis not present

## 2018-05-01 DIAGNOSIS — E785 Hyperlipidemia, unspecified: Secondary | ICD-10-CM | POA: Diagnosis not present

## 2018-05-01 DIAGNOSIS — E559 Vitamin D deficiency, unspecified: Secondary | ICD-10-CM

## 2018-05-01 DIAGNOSIS — I10 Essential (primary) hypertension: Secondary | ICD-10-CM | POA: Diagnosis not present

## 2018-05-01 DIAGNOSIS — C3492 Malignant neoplasm of unspecified part of left bronchus or lung: Secondary | ICD-10-CM

## 2018-05-01 MED ORDER — HYDROCODONE-ACETAMINOPHEN 5-325 MG PO TABS: ORAL_TABLET | ORAL | 0 refills | Status: DC

## 2018-05-01 MED ORDER — ALPRAZOLAM 1 MG PO TABS: 1.0000 mg | ORAL_TABLET | Freq: Two times a day (BID) | ORAL | 3 refills | Status: DC

## 2018-05-01 MED ORDER — HYDROCODONE-ACETAMINOPHEN 5-325 MG PO TABS: ORAL_TABLET | ORAL | 0 refills | Status: AC

## 2018-05-01 NOTE — Patient Instructions (Signed)
F/U in 11 to 12 weeks, call if you need me before  [facility] great reponse and doing well, stay stronger and grow stronger   Medication is sent in  Fasting lipid, vit D  At hospital fasting 05/06/2018 ( has oncology labs that day)  Thank you  for choosing [facility]. We consider it a privelige to serve you.  Delivering excellent health care in a caring and  compassionate way is our goal.  Partnering with you,  so that together we can achieve this goal is our strategy.

## 2018-05-01 NOTE — Progress Notes (Signed)
Fill Date ID Written Drug Qty Days Prescriber Rx # Pharmacy Refill Daily Dose * Pymt Type PMP  04/15/2018  3  02/14/2018  Alprazolam 1 Mg Tablet  60.00 30 Ma Sim  [id number]  Wal ([id number])  0/0 4.00 LME Comm Ins  [place]  04/10/2018  3  04/10/2018  Morphine Sulf 100 Mg/5 Ml Conc  30.00 15 Ra Loc  [id number]  Car ([id number])  0/0 40.00 MME Comm Ins  [place]  04/09/2018  3  02/09/2018  Hydrocodone-Acetamin 5-325 Mg  90.00 30 Ma Sim  [id number]  Wal ([id number])  0/0 15.00 MME Comm Ins  [place]  03/26/2018  3  03/26/2018  Morphine Sulf 100 Mg/5 Ml Conc  30.00 15 Ra Loc  [id number]  Car ([id number])  0/0 40.00 MME Comm Ins  [place]  03/15/2018  3  02/14/2018  Alprazolam 1 Mg Tablet  60.00 30 Ma Sim  [id number]  Wal ([id number])  0/1 4.00 LME Comm Ins  [place]  03/10/2018  3  02/09/2018  Hydrocodone-Acetamin 5-325 Mg  90.00 30 Ma Sim  [id number]  Wal ([id number])  0/0 15.00 MME Comm Ins  [place]  02/16/2018  3  02/14/2018  Alprazolam 1 Mg Tablet  60.00 30 Ma Sim  [id number]  Wal ([id number])  0/2 4.00 LME Comm Ins  [place]  02/06/2018  3  02/06/2018  Hydrocodone-Acetamin 5-325 Mg  90.00 30 Ma Sim  [id number]  Wal ([id number])  0/0 15.00 MME Comm Ins  [place]  01/20/2018  3  11/06/2017  Alprazolam 1 Mg Tablet  60.00 30 Ma Sim  [id number]  Wal ([id number])  0/3 4.00 LME Comm Ins  [place]  01/08/2018  3  10/23/2017  Hydrocodone-Acetamin 5-325 Mg  90.00 30 Ma Sim  [id number]  Wal ([id number])  0/0 15.00 MME Comm Ins  [place]  12/21/2017  3  11/06/2017  Alprazolam 1 Mg Tablet  60.00 30 Ma Sim  [id number]  Wal ([id number])  1/5 4.00 LME Comm Ins  [place]  12/10/2017  3  10/23/2017  Hydrocodone-Acetamin 5-325 Mg  90.00 30 Ma Sim  [id number]  Wal ([id number])  0/0 15.00 MME Comm Ins  [place]  11/21/2017  3  11/06/2017  Alprazolam 1 Mg Tablet  60.00 30 Ma Sim  [id number]  Wal ([id number])  0/5 4.00 LME Comm Ins  [place]  11/09/2017  3  11/07/2017  Hydrocodone-Acetamin 5-325 Mg  90.00 30 Ma Sim  [id number]  Wal ([id number])  0/0 15.00 MME Comm Ins  [place]  10/19/2017  3  05/24/2017  Alprazolam 1 Mg Tablet  60.00 30 Ma Sim  [id number]  Wal ([id number])  0/0 4.00 LME Comm Ins  [place]  10/10/2017  3  10/09/2017  Hydrocodone-Acetamin 5-325 Mg   90.00 30 Ma Sim  [id number]  Wal ([id number])  0/0 15.00 MME Comm Ins  [place]  09/20/2017  2  05/24/2017  Alprazolam 1 Mg Tablet  60.00 30 Ma Sim  [id number]  Wal ([id number])  4/5 4.00 LME Medicare  [place]  09/09/2017  1  08/14/2017  Hydrocodone-Acetamin 5-325 Mg  90.00 30 Ma Sim  [id number]  Wal ([id number])  0/0 15.00 MME Medicare    08/22/2017  2  05/24/2017  Alprazolam 1 Mg Tablet  60.00 30 Ma Sim  [id number]  Wal ([id number])  3/5

## 2018-05-02 ENCOUNTER — Encounter: Payer: Self-pay | Admitting: Family Medicine

## 2018-05-02 NOTE — Progress Notes (Signed)
[name]     MRN: [id number]      DOB: March 28, 1946   HPI [name] is here for follow up and re-evaluation of chronic medical conditions, medication management and review of any available recent lab and radiology data.  Preventive health is updated, specifically  Cancer screening and Immunization.   She has completed her course of radiation and is currently receiving chemo, noted to have pancytopenia, so unclear as to whether she can receive the flu vaccine and will message Oncologist Tolerated her treatments well overall, toward the end she developed severe painful swallowing , ans had morphine prescribed by oncology for this C/o mild insomnia as well as increased anxiety early afternoon for approximately 1 hour. Originally had hoped for additional xanax , however , after discussion , she is agreeing to meditation and behavioral techniques to address both  ROS Denies recent fever or chills. Denies sinus pressure, nasal congestion, ear pain or sore throat. Denies chest congestion, productive cough or wheezing. Denies chest pains, palpitations and leg swelling Denies abdominal pain, nausea, vomiting,diarrhea or constipation.   Denies dysuria, frequency, hesitancy or incontinence. Denies un controled joint pain, swelling and limitation in mobility. Denies headaches, seizures, numbness, or tingling. Denies depression,  C/o anxiety and  insomnia. Denies skin break down or rash.   PE  BP 128/74   Pulse 96   Resp 16   Ht 5\' 4"  (1.626 m)   Wt 216 lb (98 kg)   SpO2 94%   BMI 37.08 kg/m   Patient alert and oriented and in no cardiopulmonary distress.  HEENT: No facial asymmetry, EOMI,   oropharynx pink and moist.  Neck supple no JVD, no mass.  Chest: Clear to auscultation bilaterally.  CVS: S1, S2 no murmurs, no S3.Regular rate.  ABD: Soft non tender.   Ext: No edema  MS: Adequate though reduced  ROM spine, shoulders, hips and knees.  Skin: Intact, no ulcerations or  rash noted.  Psych: Good eye contact, normal affect. Memory intact not anxious or depressed appearing.  CNS: CN 2-12 intact, power,  normal throughout.no focal deficits noted.   Assessment & Plan  Encounter for chronic pain management The patient's Controlled Substance registry is reviewed and compliance confirmed. Adequacy of  Pain control and level of function is assessed. Medication dosing is adjusted as deemed appropriate. Twelve weeks of medication is prescribed , patient signs for the script and is provided with a follow up appointment between 11 to 12 weeks .   Depression Controlled, no change in medication   Morbid obesity ([place]) Unchanged Patient re-educated about  the importance of commitment to a  minimum of 150 minutes of exercise per week.  The importance of healthy food choices with portion control discussed. Encouraged to start a food diary, count calories and to consider  joining a support group. Sample diet sheets offered. Goals set by the patient for the next several months.   Weight /BMI 05/01/2018 03/26/2018 03/19/2018  WEIGHT 216 lb 219 lb 218 lb 12.8 oz  HEIGHT 5\' 4"  - -  BMI 37.08 kg/m2 38.79 kg/m2 38.76 kg/m2      Essential hypertension Controlled, no change in medication DASH diet and commitment to daily physical activity for a minimum of 30 minutes discussed and encouraged, as a part of hypertension management. The importance of attaining a healthy weight is also discussed.  BP/Weight 05/01/2018 03/26/2018 03/19/2018 03/19/2018 03/12/2018 03/12/2018 02/11/6377  Systolic BP 588 502 774 128 786 767 209  Diastolic BP  74 72 77 88 71 75 91  Wt. (Lbs) 216 219 - 218.8 - 219.3 225.2  BMI 37.08 38.79 - 38.76 - 38.85 39.89       Hyperlipidemia LDL goal <100 Hyperlipidemia:Low fat diet discussed and encouraged.   Lipid Panel  Lab Results  Component Value Date   CHOL 102 08/15/2017   HDL 42 (L) 08/15/2017   LDLCALC 40 08/15/2017   LDLDIRECT 85 11/14/2007     TRIG 113 08/15/2017   CHOLHDL 2.4 08/15/2017   Needs to increase activity Updated lab needed at/ before next visit.     Squamous cell lung cancer, left [facility]) Has completed radiation treatment with radiologic response Undergoing chemo

## 2018-05-02 NOTE — Assessment & Plan Note (Signed)
Has completed radiation treatment with radiologic response Undergoing chemo

## 2018-05-02 NOTE — Assessment & Plan Note (Signed)
Hyperlipidemia:Low fat diet discussed and encouraged.   Lipid Panel  Lab Results  Component Value Date   CHOL 102 08/15/2017   HDL 42 (L) 08/15/2017   LDLCALC 40 08/15/2017   LDLDIRECT 85 11/14/2007   TRIG 113 08/15/2017   CHOLHDL 2.4 08/15/2017   Needs to increase activity Updated lab needed at/ before next visit.

## 2018-05-02 NOTE — Assessment & Plan Note (Signed)
Controlled, no change in medication DASH diet and commitment to daily physical activity for a minimum of 30 minutes discussed and encouraged, as a part of hypertension management. The importance of attaining a healthy weight is also discussed.  BP/Weight 05/01/2018 03/26/2018 03/19/2018 03/19/2018 03/12/2018 03/12/2018 1/91/4782  Systolic BP 956 213 086 578 469 629 528  Diastolic BP 74 72 77 88 71 75 91  Wt. (Lbs) 216 219 - 218.8 - 219.3 225.2  BMI 37.08 38.79 - 38.76 - 38.85 39.89

## 2018-05-02 NOTE — Assessment & Plan Note (Signed)
The patient's Controlled Substance registry is reviewed and compliance confirmed. Adequacy of  Pain control and level of function is assessed. Medication dosing is adjusted as deemed appropriate. Twelve weeks of medication is prescribed , patient signs for the script and is provided with a follow up appointment between 11 to 12 weeks .  

## 2018-05-02 NOTE — Assessment & Plan Note (Signed)
Unchanged Patient re-educated about  the importance of commitment to a  minimum of 150 minutes of exercise per week.  The importance of healthy food choices with portion control discussed. Encouraged to start a food diary, count calories and to consider  joining a support group. Sample diet sheets offered. Goals set by the patient for the next several months.   Weight /BMI 05/01/2018 03/26/2018 03/19/2018  WEIGHT 216 lb 219 lb 218 lb 12.8 oz  HEIGHT 5\' 4"  - -  BMI 37.08 kg/m2 38.79 kg/m2 38.76 kg/m2

## 2018-05-02 NOTE — Assessment & Plan Note (Signed)
Controlled, no change in medication  

## 2018-05-04 ENCOUNTER — Other Ambulatory Visit: Payer: Self-pay | Admitting: Family Medicine

## 2018-05-04 DIAGNOSIS — Z1231 Encounter for screening mammogram for malignant neoplasm of breast: Secondary | ICD-10-CM

## 2018-05-04 DIAGNOSIS — I10 Essential (primary) hypertension: Secondary | ICD-10-CM

## 2018-05-04 DIAGNOSIS — E785 Hyperlipidemia, unspecified: Secondary | ICD-10-CM

## 2018-05-04 DIAGNOSIS — R7301 Impaired fasting glucose: Secondary | ICD-10-CM

## 2018-05-06 ENCOUNTER — Other Ambulatory Visit (HOSPITAL_COMMUNITY)
Admission: RE | Admit: 2018-05-06 | Discharge: 2018-05-06 | Disposition: A | Discharge status: Home / Self Care | Payer: PPO | Source: Ambulatory Visit | Attending: Family Medicine | Admitting: Family Medicine

## 2018-05-06 ENCOUNTER — Inpatient Hospital Stay (HOSPITAL_COMMUNITY): Payer: PPO

## 2018-05-06 DIAGNOSIS — J449 Chronic obstructive pulmonary disease, unspecified: Secondary | ICD-10-CM | POA: Diagnosis not present

## 2018-05-06 DIAGNOSIS — Z5112 Encounter for antineoplastic immunotherapy: Secondary | ICD-10-CM | POA: Diagnosis not present

## 2018-05-06 DIAGNOSIS — Z800 Family history of malignant neoplasm of digestive organs: Secondary | ICD-10-CM | POA: Diagnosis not present

## 2018-05-06 DIAGNOSIS — M549 Dorsalgia, unspecified: Secondary | ICD-10-CM | POA: Diagnosis not present

## 2018-05-06 DIAGNOSIS — R69 Illness, unspecified: Secondary | ICD-10-CM | POA: Insufficient documentation

## 2018-05-06 DIAGNOSIS — I10 Essential (primary) hypertension: Secondary | ICD-10-CM | POA: Diagnosis not present

## 2018-05-06 DIAGNOSIS — Z87891 Personal history of nicotine dependence: Secondary | ICD-10-CM | POA: Diagnosis not present

## 2018-05-06 DIAGNOSIS — Z9221 Personal history of antineoplastic chemotherapy: Secondary | ICD-10-CM | POA: Diagnosis not present

## 2018-05-06 DIAGNOSIS — Z7982 Long term (current) use of aspirin: Secondary | ICD-10-CM | POA: Diagnosis not present

## 2018-05-06 DIAGNOSIS — C3412 Malignant neoplasm of upper lobe, left bronchus or lung: Secondary | ICD-10-CM | POA: Diagnosis not present

## 2018-05-06 DIAGNOSIS — Z808 Family history of malignant neoplasm of other organs or systems: Secondary | ICD-10-CM | POA: Diagnosis not present

## 2018-05-06 DIAGNOSIS — E785 Hyperlipidemia, unspecified: Secondary | ICD-10-CM | POA: Diagnosis not present

## 2018-05-06 DIAGNOSIS — Z23 Encounter for immunization: Secondary | ICD-10-CM | POA: Diagnosis not present

## 2018-05-06 DIAGNOSIS — K219 Gastro-esophageal reflux disease without esophagitis: Secondary | ICD-10-CM | POA: Diagnosis not present

## 2018-05-06 DIAGNOSIS — C3492 Malignant neoplasm of unspecified part of left bronchus or lung: Secondary | ICD-10-CM

## 2018-05-06 DIAGNOSIS — F319 Bipolar disorder, unspecified: Secondary | ICD-10-CM | POA: Diagnosis not present

## 2018-05-06 DIAGNOSIS — Z801 Family history of malignant neoplasm of trachea, bronchus and lung: Secondary | ICD-10-CM | POA: Diagnosis not present

## 2018-05-06 DIAGNOSIS — Z79899 Other long term (current) drug therapy: Secondary | ICD-10-CM | POA: Diagnosis not present

## 2018-05-06 DIAGNOSIS — E669 Obesity, unspecified: Secondary | ICD-10-CM | POA: Diagnosis not present

## 2018-05-06 LAB — CBC WITH DIFFERENTIAL/PLATELET
Basophils Absolute: 0 10*3/uL (ref 0.0–0.1)
Basophils Relative: 0 %
Eosinophils Absolute: 0 10*3/uL (ref 0.0–0.7)
Eosinophils Relative: 1 %
HCT: 34.7 % — ABNORMAL LOW (ref 36.0–46.0)
Hemoglobin: 10.5 g/dL — ABNORMAL LOW (ref 12.0–15.0)
LYMPHS ABS: 0.7 10*3/uL (ref 0.7–4.0)
Lymphocytes Relative: 18 %
MCH: 28.3 pg (ref 26.0–34.0)
MCHC: 30.3 g/dL (ref 30.0–36.0)
MCV: 93.5 fL (ref 78.0–100.0)
MONOS PCT: 12 %
Monocytes Absolute: 0.5 10*3/uL (ref 0.1–1.0)
NEUTROS PCT: 69 %
Neutro Abs: 2.6 10*3/uL (ref 1.7–7.7)
Platelets: 239 10*3/uL (ref 150–400)
RBC: 3.71 MIL/uL — ABNORMAL LOW (ref 3.87–5.11)
RDW: 19.1 % — ABNORMAL HIGH (ref 11.5–15.5)
WBC: 3.8 10*3/uL — ABNORMAL LOW (ref 4.0–10.5)

## 2018-05-06 LAB — LIPID PANEL
CHOL/HDL RATIO: 2.5 ratio
Cholesterol: 118 mg/dL (ref 0–200)
HDL: 48 mg/dL (ref >40)
LDL CALC: 35 mg/dL (ref 0–99)
Triglycerides: 177 mg/dL — ABNORMAL HIGH (ref <150)
VLDL: 35 mg/dL (ref 0–40)

## 2018-05-06 LAB — IRON AND TIBC
Iron: 45 ug/dL (ref 28–170)
Saturation Ratios: 11 % (ref 10.4–31.8)
TIBC: 404 ug/dL (ref 250–450)
UIBC: 359 ug/dL

## 2018-05-06 LAB — COMPREHENSIVE METABOLIC PANEL WITH GFR
ALT: 9 U/L (ref 0–44)
AST: 14 U/L — ABNORMAL LOW (ref 15–41)
Albumin: 3.5 g/dL (ref 3.5–5.0)
Alkaline Phosphatase: 62 U/L (ref 38–126)
Anion gap: 8 (ref 5–15)
BUN: 18 mg/dL (ref 8–23)
CO2: 23 mmol/L (ref 22–32)
Calcium: 8.5 mg/dL — ABNORMAL LOW (ref 8.9–10.3)
Chloride: 110 mmol/L (ref 98–111)
Creatinine, Ser: 0.9 mg/dL (ref 0.44–1.00)
GFR calc Af Amer: >60 mL/min
GFR calc non Af Amer: >60 mL/min
Glucose, Bld: 163 mg/dL — ABNORMAL HIGH (ref 70–99)
Potassium: 3.6 mmol/L (ref 3.5–5.1)
Sodium: 141 mmol/L (ref 135–145)
Total Bilirubin: 0.4 mg/dL (ref 0.3–1.2)
Total Protein: 6.5 g/dL (ref 6.5–8.1)

## 2018-05-06 LAB — FERRITIN: Ferritin: 14 ng/mL (ref 11–307)

## 2018-05-06 LAB — VITAMIN B12: Vitamin B-12: 339 pg/mL (ref 180–914)

## 2018-05-06 LAB — FOLATE: Folate: 8.6 ng/mL

## 2018-05-06 NOTE — Patient Instructions (Signed)
[place] will see the doctor regularly throughout treatment.  We monitor your lab work prior to every treatment. The doctor monitors your response to treatment by the way you are feeling, your blood work, and scans periodically.  There will be wait times while you are here for treatment.  It will take about 30 minutes to 1 hour for your lab work to result.  Then there will be wait times while pharmacy mixes your medications.   Durvalumab (Imfinzi)  About This Drug Durvalumab is used to treat cancer. This drug is given in the vein (IV).  Possible Side Effects . Tiredness . Muscle and bone pain . Constipation (not able to move bowels) . Decreased appetite (decreased hunger) . Nausea and throwing up (vomiting). . Swelling of your legs, ankles and/or feet . Urinary tract infection. Symptoms may include: . pain or burning when you pass urine . feeling like you have to pass urine often, but not much comes out when you do. . tender or heavy feeling in your lower abdomen . cloudy urine and/or urine that smells bad . pain on one side of your back under your ribs. This is where your kidneys are. . fever, chills, nausea and/or throwing up Note: Each of the side effects above was reported in 15% or greater of patients treated with durvalumab. Not all possible side effects are included above.  Warnings and Precautions . Inflammation (swelling) of the lungs. You may have a dry cough or trouble breathing. . Colitis which is swelling in the colon. The symptoms are loose bowel movements (diarrhea) stomach cramping, and sometimes blood in the bowel movements. . Changes in your liver function. . This drug may affect how your kidneys work . This drug may affect some of your hormone glands (especially the thyroid, adrenals, pituitary and pancreas). Your doctor may check your hormone levels as needed. . Increased risk of infection. You may get a fever and  chills. . While you are getting this drug in your vein (IV), you may have a reaction to the drug. Sometimes you may be given medication to stop or lessen these side effects. Your nurse will check you closely for these signs: fever or shaking chills, flushing, facial swelling, feeling dizzy, headache, trouble breathing, rash, itching, chest tightness, or chest pain. These reactions may happen for 24 hours after your infusion. If this happens, call 911 for emergency care.  Treating Side Effects . Manage tiredness by pacing your activities for the day. Be sure to include periods of rest between energy-draining activities . Keeping your pain under control is important to your well-being. Please tell your doctor or nurse if you are experiencing pain. . Ask your doctor or nurse about medicines that are available to help stop or lessen constipation. . If you are not able to move your bowels, check with your doctor or nurse before you use enemas, laxatives, or suppositories . Drink plenty of fluids (a minimum of eight glasses per day is recommended). . If you throw up or have loose bowel movements, you should drink more fluids so that you do not become dehydrated (lack water in the body from losing too much fluid). . To help with nausea and vomiting, eat small, frequent meals instead of three large meals a day. Choose foods and drinks that are at room temperature. Ask your nurse or doctor about other helpful tips and medicine that is available to help or stop lessen these symptoms. Marland Kitchen  To help with decreased appetite, eat small, frequent meals . Eat high caloric food such as pudding, ice cream, yogurt and milkshakes. . Infusion reactions may happen for 24 hours after your infusion. If this happens, call 911 for emergency care.  Food and Drug Interactions . There are no known interactions of durvalumab with food. This drug may interact with other medicines. Tell your doctor and pharmacist about all the  medicines and dietary supplements (vitamins, minerals, herbs and others) that you are taking at this time. The safety and use of dietary supplements and alternative diets are often not known. Using these might affect your cancer or interfere with your treatment. Until more is known, you should not use dietary supplements or alternative diets without your cancer doctor's help.  When to Call the Doctor Call your doctor or nurse if you have any of these symptoms and/or any new or unusual symptoms: . Fever of 100.5 F (38 C) or higher . Chills, flushing . Wheezing, chest pain or trouble breathing . Facial swelling . Rash, itching or skin blistering . Rash that is not relieved by prescribed medicines . Pain when passing urine . Feeling dizzy or lightheaded . Confusion . Nausea that stops you from eating or drinking or is not relieved by prescribed medicine . No bowel movement for 3 days or you feel uncomfortable . Severe pain in the stomach area . Pain or burning when you pass urine. . Difficulty urinating . Feeling like you have to pass urine often, but not much comes out when you do. . Tender or heavy feeling in your lower abdomen. . Cloudy urine and/or urine that smells bad. . Pain on one side of your back under your ribs. This is where your kidneys are. . Decreased urine . Unusual thirst or passing urine often . Fatigue that interferes with your daily activities . Signs of liver problems: dark urine, pale bowel movements, bad stomach pain, feeling very tired and weak, unusual itching, or yellowing of the eyes or skin . Signs of infusion reaction: fever or shaking chills, flushing, facial swelling, feeling dizzy, headache, trouble breathing, rash, itching, chest tightness, or chest pain. . If you think you are pregnant  Reproduction Warnings . Pregnancy warning: This drug can have harmful effects on the unborn baby. It is recommended that effective methods of birth control should be used  by women of child-bearing age during cancer treatment and for at least 3 months after treatment. . Breast feeding warning: Women should not breast feed during treatment and for 3 month after treatment because this drug could enter the breast milk and cause harm to a breast feeding baby.  SELF CARE ACTIVITIES WHILE ON CHEMOTHERAPY:  Hydration Increase your fluid intake 48 hours prior to treatment and drink at least 8 to 12 cups (64 ounces) of water/decaffeinated beverages per day after treatment. You can still have your cup of coffee or soda but these beverages do not count as part of your 8 to 12 cups that you need to drink daily. No alcohol intake.  Medications Continue taking your normal prescription medication as prescribed.  If you start any new herbal or new supplements please let us know first to make sure it is safe.  Mouth Care Have teeth cleaned professionally before starting treatment. Keep dentures and partial plates clean. Use soft toothbrush and do not use mouthwashes that contain alcohol. Biotene is a good mouthwash that is available at most pharmacies or may be ordered by calling [phone]. Use  warm salt water gargles (1 teaspoon salt per 1 quart warm water) before and after meals and at bedtime. Or you may rinse with 2 tablespoons of three-percent hydrogen peroxide mixed in eight ounces of water. If you are still having problems with your mouth or sores in your mouth please call the clinic. If you need dental work, please let the doctor know before you go for your appointment so that we can coordinate the best possible time for you in regards to your chemo regimen. You need to also let your dentist know that you are actively taking chemo. We may need to do labs prior to your dental appointment.  Skin Care Always use sunscreen that has not expired and with SPF (Sun Protection Factor) of 50 or higher. Wear hats to protect your head from the sun. Remember to use sunscreen on your  hands, ears, face, & feet.  Use good moisturizing lotions such as udder cream, eucerin, or even Vaseline. Some chemotherapies can cause dry skin, color changes in your skin and nails.    . Avoid long, hot showers or baths. . Use gentle, fragrance-free soaps and laundry detergent. . Use moisturizers, preferably creams or ointments rather than lotions because the thicker consistency is better at preventing skin dehydration. Apply the cream or ointment within 15 minutes of showering. Reapply moisturizer at night, and moisturize your hands every time after you wash them.  Hair Loss (if your doctor says your hair will fall out)  . If your doctor says that your hair is likely to fall out, decide before you begin chemo whether you want to wear a wig. You may want to shop before treatment to match your hair color. . Hats, turbans, and scarves can also camouflage hair loss, although some people prefer to leave their heads uncovered. If you go bare-headed outdoors, be sure to use sunscreen on your scalp. . Cut your hair short. It eases the inconvenience of shedding lots of hair, but it also can reduce the emotional impact of watching your hair fall out. . Don't perm or color your hair during chemotherapy. Those chemical treatments are already damaging to hair and can enhance hair loss. Once your chemo treatments are done and your hair has grown back, it's OK to resume dyeing or perming hair. With chemotherapy, hair loss is almost always temporary. But when it grows back, it may be a different color or texture. In older adults who still had hair color before chemotherapy, the new growth may be completely gray.  Often, new hair is very fine and soft.  Infection Prevention Please wash your hands for at least 30 seconds using warm soapy water. Handwashing is the #1 way to prevent the spread of germs. Stay away from sick people or people who are getting over a cold. If you develop respiratory systems such as  green/yellow mucus production or productive cough or persistent cough let us know and we will see if you need an antibiotic. It is a good idea to keep a pair of gloves on when going into grocery stores/[employer] to decrease your risk of coming into contact with germs on the carts, etc. Carry alcohol hand gel with you at all times and use it frequently if out in public. If your temperature reaches 100.5 or higher please call the clinic and let us know.  If it is after hours or on the weekend please go to the ER if your temperature is over 100.5.  Please have your own personal thermometer  at home to use.    Sex and bodily fluids If you are going to have sex, a condom must be used to protect the person that isn't taking chemotherapy. Chemo can decrease your libido (sex drive). For a few days after chemotherapy, chemotherapy can be excreted through your bodily fluids.  When using the toilet please close the lid and flush the toilet twice.  Do this for a few day after you have had chemotherapy.   Effects of chemotherapy on your sex life Some changes are simple and won't last long. They won't affect your sex life permanently. Sometimes you may feel: . too tired . not strong enough to be very active . sick or sore  . not in the mood . anxious or low Your anxiety might not seem related to sex. For example, you may be worried about the cancer and how your treatment is going. Or you may be worried about money, or about how you family are coping with your illness. These things can cause stress, which can affect your interest in sex. It's important to talk to your partner about how you feel. Remember - the changes to your sex life don't usually last long. There's usually no medical reason to stop having sex during chemo. The drugs won't have any long term physical effects on your performance or enjoyment of sex. Cancer can't be passed on to your partner during sex  Contraception It's important to use reliable  contraception during treatment. Avoid getting pregnant while you or your partner are having chemotherapy. This is because the drugs may harm the baby. Sometimes chemotherapy drugs can leave a man or woman infertile.  This means you would not be able to have children in the future. You might want to talk to someone about permanent infertility. It can be very difficult to learn that you may no longer be able to have children. Some people find counselling helpful. There might be ways to preserve your fertility, although this is easier for men than for women. You may want to speak to a fertility expert. You can talk about sperm banking or harvesting your eggs. You can also ask about other fertility options, such as donor eggs. If you have or have had breast cancer, your doctor might advise you not to take the contraceptive pill. This is because the hormones in it might affect the cancer.  It is not known for sure whether or not chemotherapy drugs can be passed on through semen or secretions from the vagina. Because of this some doctors advise people to use a barrier method if you have sex during treatment. This applies to vaginal, anal or oral sex. Generally, doctors advise a barrier method only for the time you are actually having the treatment and for about a week after your treatment. Advice like this can be worrying, but this does not mean that you have to avoid being intimate with your partner. You can still have close contact with your partner and continue to enjoy sex.  Animals If you have cats or birds we just ask that you not change the litter or change the cage.  Please have someone else do this for you while you are on chemotherapy.   Food Safety During and After Cancer Treatment Food safety is important for people both during and after cancer treatment. Cancer and cancer treatments, such as chemotherapy, radiation therapy, and stem cell/bone marrow transplantation, often weaken the immune system.  This makes it harder for your body to  protect itself from foodborne illness, also called food poisoning. Foodborne illness is caused by eating food that contains harmful bacteria, parasites, or viruses.  Foods to avoid Some foods have a higher risk of becoming tainted with bacteria. These include: Marland Kitchen Unwashed fresh fruit and vegetables, especially leafy vegetables that can hide dirt and other contaminants . Raw sprouts, such as alfalfa sprouts . Raw or undercooked beef, especially ground beef, or other raw or undercooked meat and poultry . Fatty, fried, or spicy foods immediately before or after treatment.  These can sit heavy on your stomach and make you feel nauseous. . Raw or undercooked shellfish, such as oysters. . Sushi and sashimi, which often contain raw fish.  . Unpasteurized beverages, such as unpasteurized fruit juices, raw milk, raw yogurt, or cider . Undercooked eggs, such as soft boiled, over easy, and poached; raw, unpasteurized eggs; or foods made with raw egg, such as homemade raw cookie dough and homemade mayonnaise Simple steps for food safety Shop smart. . Do not buy food stored or displayed in an unclean area. . Do not buy bruised or damaged fruits or vegetables. . Do not buy cans that have cracks, dents, or bulges. . Pick up foods that can spoil at the end of your shopping trip and store them in a cooler on the way home. Prepare and clean up foods carefully. . Rinse all fresh fruits and vegetables under running water, and dry them with a clean towel or paper towel. . Clean the top of cans before opening them. . After preparing food, wash your hands for 20 seconds with hot water and soap. Pay special attention to areas between fingers and under nails. . Clean your utensils and dishes with hot water and soap. Marland Kitchen Disinfect your kitchen and cutting boards using 1 teaspoon of liquid, unscented bleach mixed into 1 quart of water.   Dispose of old food. . Eat canned and packaged  food before its expiration date (the "use by" or "best before" date). . Consume refrigerated leftovers within 3 to 4 days. After that time, throw out the food. Even if the food does not smell or look spoiled, it still may be unsafe. Some bacteria, such as Listeria, can grow even on foods stored in the refrigerator if they are kept for too long. Take precautions when eating out. . At restaurants, avoid buffets and salad bars where food sits out for a long time and comes in contact with many people. Food can become contaminated when someone with a virus, often a norovirus, or another "bug" handles it. . Put any leftover food in a "to-go" container yourself, rather than having the server do it. And, refrigerate leftovers as soon as you get home. . Choose restaurants that are clean and that are willing to prepare your food as you order it cooked.   MEDICATIONS:  Compazine/Prochlorperazine 10mg  tablet. Take 1 tablet every 6 hours as needed for nausea/vomiting. (This can make you sleepy)   EMLA cream. Apply a quarter size amount to port site 1 hour prior to chemo. Do not rub in. Cover with plastic wrap.   Over-the-Counter Meds:  Colace - 100 mg capsules - take 2 capsules daily.  If this doesn't help then you can increase to 2 capsules twice daily.  Call us if this does not help your bowels move.   Imodium 2mg  capsule. Take 2 capsules after the 1st loose stool and then 1 capsule every 2 hours until you go a total of 12 hours without having a loose stool. Call the [place] if loose stools continue. If diarrhea occurs at bedtime, take 2 capsules at bedtime. Then take 2 capsules every 4 hours until morning. Call [place].    Diarrhea Sheet   If you are having loose stools/diarrhea, please purchase Imodium and begin taking as  outlined:  At the first sign of poorly formed or loose stools you should begin taking Imodium (loperamide) 2 mg capsules.  Take two caplets (4mg ) followed by one caplet (2mg ) every 2 hours until you have had no diarrhea for 12 hours.  During the night take two caplets (4mg ) at bedtime and continue every 4 hours during the night until the morning.  Stop taking Imodium only after there is no sign of diarrhea for 12 hours.    Always call the [facility] if you are having loose stools/diarrhea that you can't get under control.  Loose stools/diarrhea leads to dehydration (loss of water) in your body.  We have other options of trying to get the loose stools/diarrhea to stop but you must let us know!   Constipation Sheet  Colace - 100 mg capsules - take 2 capsules daily.  If this doesn't help then you can increase to 2 capsules twice daily.  Please call if the above does not work for you.   Do not go more than 2 days without a bowel movement.  It is very important that you do not become constipated.  It will make you feel sick to your stomach (nausea) and can cause abdominal pain and vomiting.   Nausea Sheet   Compazine/Prochlorperazine 10mg  tablet. Take 1 tablet every 6 hours as needed for nausea/vomiting. (This can make you sleepy)  If you are having persistent nausea (nausea that does not stop) please call the [facility] and let us know the amount of nausea that you are experiencing.  If you begin to vomit, you need to call the [facility] and if it is the weekend and you have vomited more than one time and can't get it to stop-go to the Emergency Room.  Persistent nausea/vomiting can lead to dehydration (loss of fluid in your body) and will make you feel terrible.   Ice chips, sips of clear liquids, foods that are @ room temperature, crackers, and toast tend to be better tolerated.   SYMPTOMS TO REPORT AS SOON AS POSSIBLE AFTER TREATMENT:   FEVER GREATER THAN 100.5 F  CHILLS WITH OR  WITHOUT FEVER  NAUSEA AND VOMITING THAT IS NOT CONTROLLED WITH YOUR NAUSEA MEDICATION  UNUSUAL SHORTNESS OF BREATH  UNUSUAL BRUISING OR BLEEDING  TENDERNESS IN MOUTH AND THROAT WITH OR WITHOUT PRESENCE OF ULCERS  URINARY PROBLEMS  BOWEL PROBLEMS  UNUSUAL RASH      Wear comfortable clothing and clothing appropriate for easy access to any Portacath or PICC line. Let  us know if there is anything that we can do to make your therapy better!    What to do if you need assistance after hours or on the weekends: CALL [phone].  HOLD on the line, do not hang up.  You will hear multiple messages but at the end you will be connected with a nurse triage line.  They will contact the doctor if necessary.  Most of the time they will be able to assist you.  Do not call the hospital operator.      I have been informed and understand all of the instructions given to me and have received a copy. I have been instructed to call the clinic [phone] or my family physician as soon as possible for continued medical care, if indicated. I do not have any more questions at this time but understand that I may call the [facility] or the Patient Navigator at [phone] during office hours should I have questions or need assistance in obtaining follow-up care.

## 2018-05-07 ENCOUNTER — Inpatient Hospital Stay (HOSPITAL_BASED_OUTPATIENT_CLINIC_OR_DEPARTMENT_OTHER): Payer: PPO | Admitting: Hematology

## 2018-05-07 ENCOUNTER — Inpatient Hospital Stay (HOSPITAL_COMMUNITY): Payer: PPO

## 2018-05-07 ENCOUNTER — Other Ambulatory Visit: Payer: Self-pay

## 2018-05-07 ENCOUNTER — Encounter (HOSPITAL_COMMUNITY): Payer: Self-pay | Admitting: Hematology

## 2018-05-07 VITALS — BP 132/64 | HR 106 | Temp 98.0°F | Resp 18 | Wt 216.5 lb

## 2018-05-07 VITALS — BP 140/77 | HR 92 | Temp 98.2°F | Resp 18

## 2018-05-07 DIAGNOSIS — Z800 Family history of malignant neoplasm of digestive organs: Secondary | ICD-10-CM

## 2018-05-07 DIAGNOSIS — Z808 Family history of malignant neoplasm of other organs or systems: Secondary | ICD-10-CM

## 2018-05-07 DIAGNOSIS — J449 Chronic obstructive pulmonary disease, unspecified: Secondary | ICD-10-CM | POA: Diagnosis not present

## 2018-05-07 DIAGNOSIS — C3412 Malignant neoplasm of upper lobe, left bronchus or lung: Secondary | ICD-10-CM | POA: Diagnosis not present

## 2018-05-07 DIAGNOSIS — E669 Obesity, unspecified: Secondary | ICD-10-CM | POA: Diagnosis not present

## 2018-05-07 DIAGNOSIS — Z923 Personal history of irradiation: Secondary | ICD-10-CM

## 2018-05-07 DIAGNOSIS — Z9221 Personal history of antineoplastic chemotherapy: Secondary | ICD-10-CM

## 2018-05-07 DIAGNOSIS — Z87891 Personal history of nicotine dependence: Secondary | ICD-10-CM

## 2018-05-07 DIAGNOSIS — C3492 Malignant neoplasm of unspecified part of left bronchus or lung: Secondary | ICD-10-CM

## 2018-05-07 DIAGNOSIS — F319 Bipolar disorder, unspecified: Secondary | ICD-10-CM | POA: Diagnosis not present

## 2018-05-07 DIAGNOSIS — E785 Hyperlipidemia, unspecified: Secondary | ICD-10-CM

## 2018-05-07 DIAGNOSIS — M549 Dorsalgia, unspecified: Secondary | ICD-10-CM

## 2018-05-07 DIAGNOSIS — Z7982 Long term (current) use of aspirin: Secondary | ICD-10-CM

## 2018-05-07 DIAGNOSIS — Z79899 Other long term (current) drug therapy: Secondary | ICD-10-CM

## 2018-05-07 DIAGNOSIS — F17210 Nicotine dependence, cigarettes, uncomplicated: Secondary | ICD-10-CM

## 2018-05-07 DIAGNOSIS — I10 Essential (primary) hypertension: Secondary | ICD-10-CM

## 2018-05-07 DIAGNOSIS — Z5112 Encounter for antineoplastic immunotherapy: Secondary | ICD-10-CM

## 2018-05-07 DIAGNOSIS — K219 Gastro-esophageal reflux disease without esophagitis: Secondary | ICD-10-CM | POA: Diagnosis not present

## 2018-05-07 LAB — VITAMIN D 25 HYDROXY (VIT D DEFICIENCY, FRACTURES): VIT D 25 HYDROXY: 29 ng/mL — AB (ref 30.0–100.0)

## 2018-05-07 MED ORDER — SODIUM CHLORIDE 0.9 % IV SOLN
10.2000 mg/kg | Freq: Once | INTRAVENOUS | Status: AC
  Administered 2018-05-07: 1000 mg via INTRAVENOUS
  Filled 2018-05-07: qty 20

## 2018-05-07 MED ORDER — HEPARIN SOD (PORK) LOCK FLUSH 100 UNIT/ML IV SOLN
500.0000 [IU] | Freq: Once | INTRAVENOUS | Status: AC | PRN
  Administered 2018-05-07: 500 [IU]
  Filled 2018-05-07: qty 5

## 2018-05-07 MED ORDER — INFLUENZA VAC SPLIT HIGH-DOSE 0.5 ML IM SUSY
0.5000 mL | PREFILLED_SYRINGE | INTRAMUSCULAR | Status: AC
  Administered 2018-05-07: 0.5 mL via INTRAMUSCULAR
  Filled 2018-05-07: qty 0.5

## 2018-05-07 MED ORDER — SODIUM CHLORIDE 0.9 % IV SOLN
Freq: Once | INTRAVENOUS | Status: AC
  Administered 2018-05-07: 13:00:00 via INTRAVENOUS

## 2018-05-07 NOTE — Assessment & Plan Note (Signed)
1.  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  She was evaluated by Dr. Roxan Hockey on 01/08/2018 and felt not to be a surgical candidate. - Started on chemoradiation therapy with weekly carboplatin and paclitaxel on 02/11/2018. - She had trouble with swallowing and retrosternal pain over the weekend.  [facility] mouthwash did not help.  Dr.Yanagihara gave her lidocaine 15 mL every 4 hours which is helping.  She is able to eat well now. - She completed chemotherapy on 03/19/2018 and radiation therapy on 03/25/2018.  She has recovered well from side effects of radiation and chemo. - I have discussed the results of the CT scan of the chest dated 04/30/2018 which showed central left upper lobe lung mass has decreased in size to 2.1 x 1.6 cm, from 3.4 x 3 cm.  There is also decreased extrinsic narrowing of the segmental left upper lobe bronchi.  Clustered satellite nodularity in the posterior left upper lobe has decreased.  No new areas were seen. -I have recommended consolidation immunotherapy with Durvalumab given every 2 weeks for 12 months.  We talked about the immunotherapy related side effects including but not limited to dermatitis, pneumonitis, hypophysitis, colitis, hepatitis, and reactivation of autoimmune conditions.  She understands and gives his permission to proceed with the treatment.  I will see her back in 2 weeks for follow-up.  2.  Family history:  The 2 of her sisters died of stomach cancer.  Father died of lung cancer.  Maternal aunt died of thyroid cancer.  Maternal grandfather had cancer in the bone.  Primary is unknown.  We will refer her to our geneticist.

## 2018-05-07 NOTE — Progress Notes (Signed)
[name], [place] [address]   CLINIC:  Medical Oncology/Hematology  PCP:  Fayrene Helper, MD [address] [place] [address] [phone]   REASON FOR VISIT: Follow-up for squamous cell lung cancer  CURRENT THERAPY: Imfinizi every 2 weeks   BRIEF ONCOLOGIC HISTORY:    Squamous cell lung cancer, left ([facility])   10/28/2017 Imaging    CT scan of the chest shows left hilar mass measuring 3.4 x 2.9 cm, multiple soft tissue masses in the left upper lobe, largest measuring 1.6 cm  MRI of the brain on 10/31/2017 shows dural based focus of contrast enhancement along the superior left convexity, meningioma favored as it was present on MRI in 2009    10/30/2017 Initial Biopsy    Bronchoscopy and biopsy consistent with squamous cell cancer of the lung    11/08/2017 Family History    2 sisters died of stomach cancer Father died of lung cancer in a smoker Maternal aunt died of thyroid cancer Maternal grandfather had cancer spread to the bone    02/05/2018 - 03/25/2018 Chemotherapy    The patient had palonosetron (ALOXI) injection 0.25 mg, 0.25 mg, Intravenous,  Once, 6 of 6 cycles Administration: 0.25 mg (02/11/2018), 0.25 mg (02/18/2018), 0.25 mg (02/25/2018), 0.25 mg (03/04/2018), 0.25 mg (03/12/2018), 0.25 mg (03/19/2018) CARBOplatin (PARAPLATIN) 220 mg in sodium chloride 0.9 % 250 mL chemo infusion, 220 mg (100 % of original dose 215.8 mg), Intravenous,  Once, 6 of 6 cycles Dose modification:   (original dose 215.8 mg, Cycle 1),   (original dose 215.8 mg, Cycle 2), 215.8 mg (original dose 215.8 mg, Cycle 3),   (original dose 215.8 mg, Cycle 4) Administration: 220 mg (02/11/2018), 220 mg (02/18/2018), 220 mg (02/25/2018), 220 mg (03/04/2018), 220 mg (03/12/2018), 220 mg (03/19/2018) PACLitaxel (TAXOL) 96 mg in sodium chloride 0.9 % 250 mL chemo infusion (</= 25m/m2), 45 mg/m2 = 96 mg, Intravenous,  Once, 6 of 6 cycles Administration: 96 mg (02/11/2018),  96 mg (02/18/2018), 96 mg (02/25/2018), 96 mg (03/04/2018), 96 mg (03/12/2018), 96 mg (03/19/2018)  for chemotherapy treatment.      Malignant neoplasm of upper lobe of left lung ([place]   01/22/2018 Initial Diagnosis    Malignant neoplasm of upper lobe of left lung ([place]    05/06/2018 -  Chemotherapy    The patient had durvalumab (IMFINZI) 1,000 mg in sodium chloride 0.9 % 100 mL chemo infusion, 10.2 mg/kg = 980 mg, Intravenous,  Once, 1 of 6 cycles Administration: 1,000 mg (05/07/2018)  for chemotherapy treatment.       INTERVAL HISTORY:  Ms. [email or web address]. female returns for routine follow-up for squamous cell lung cancer. Patient is here today with her husband and son. Patient has no complaints at this time. Her appetite and energy level are at 100% and she is maintaining her weight well. She is also drinking boost 2 every other day. She denies any ne pains. Denies any SOB or cough. Denies any nausea, vomiting, or diarrhea.     REVIEW OF SYSTEMS:  Review of Systems  Musculoskeletal: Positive for back pain.  All other systems reviewed and are negative.    PAST MEDICAL/SURGICAL HISTORY:  Past Medical History:  Diagnosis Date  . Anxiety   . Arthritis   . Bipolar disorder ([place]   . Chronic back pain   . COPD (chronic obstructive pulmonary disease) ([place]   . Family history of lung cancer   . Family  history of prostate cancer   . Family history of stomach cancer   . 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 GERD (gastroesophageal reflux disease) 2000  . Hyperlipidemia 1995  . Hypertension 1995  . Nicotine addiction   . Obesity    Past Surgical History:  Procedure Laterality Date  . BACK SURGERY  1999  . BRONCHIAL BRUSHINGS Left 10/30/2017   Procedure: BRONCHIAL BRUSHINGS;  Surgeon: [name], MD;  [facility];  Service: Cardiopulmonary;  Laterality: Left;  . BRONCHIAL WASHINGS Left 10/30/2017   Procedure: BRONCHIAL WASHINGS;   Surgeon: [name], MD;  Location: [facility];  Service: Cardiopulmonary;  Laterality: Left;  . CHOLECYSTECTOMY  2001  . COLONOSCOPY N/A 02/02/2016   Procedure: COLONOSCOPY;  Surgeon: [name], MD;  Location: [facility];  Service: Endoscopy;  Laterality: N/A;  930  . FLEXIBLE BRONCHOSCOPY Bilateral 10/30/2017   Procedure: FLEXIBLE BRONCHOSCOPY;  Surgeon: [name], MD;  Location: [facility];  Service: Cardiopulmonary;  Laterality: Bilateral;  . INCISIONAL HERNIA REPAIR  August 17, 2008  . left inguinal hernia herniorrhapy  1980  . PORTACATH PLACEMENT Left 02/03/2018   Procedure: INSERTION PORT-A-CATH;  Surgeon: [name], MD;  Location: [facility];  Service: General;  Laterality: Left;  . removal of thmoma  03/27/2010   Dr. Arlyce Dice   . SPINE SURGERY    . TONSILLECTOMY    . TOTAL ABDOMINAL HYSTERECTOMY W/ BILATERAL SALPINGOOPHORECTOMY  1984     SOCIAL HISTORY:  Social History   Socioeconomic History  . Marital status: Married    Spouse name: Not on file  . Number of children: Not on file  . Years of education: Not on file  . Highest education level: Not on file  Occupational History  . 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 Financial resource strain: Not hard at all  . Food insecurity:    Worry: Never true    Inability: Never true  . Transportation needs:    Medical: No    Non-medical: No  Tobacco Use  . Smoking status: Former Smoker    Packs/day: 1.50    Years: 54.00    Pack years: 81.00    Types: Cigarettes    Last attempt to quit: 08/20/2017    Years since quitting: 0.7  . Smokeless tobacco: Never Used  Substance and Sexual Activity  . Alcohol use: No    Alcohol/week: 0.0 standard drinks  . Drug use: No  . Sexual activity: Not Currently  Lifestyle  . Physical activity:    Days per week: 0 days    Minutes per session: 0 min  . Stress: Only a little  Relationships  . Social connections:      Talks on phone: More than three times a week    Gets together: More than three times a week    Attends religious service: More than 4 times per year    Active member of club or organization: No    Attends meetings of clubs or organizations: Never    Relationship status: Married  . Intimate partner violence:    Fear of current or ex partner: No    Emotionally abused: No    Physically abused: No    Forced sexual activity: No  Other Topics Concern  . Not on file  Social History Narrative   Pt had 2 stillborns  FAMILY HISTORY:  Family History  Problem Relation Age of Onset  . Heart disease Mother        enlarged  . Diabetes Mother   . Hypertension Mother   . Cancer Father        throat cancer - smoker  . Hypertension Father   . Coronary artery disease Father   . Diabetes Sister        x3  . Asthma Sister   . Lung cancer Sister 44       d. 16; smoker  . Kidney disease Brother   . Stomach cancer Sister        dx in her 64s-50s  . Stroke Brother   . Prostate cancer Brother 78       pat 1/2 brother  . Thyroid cancer Maternal Aunt        dx and d. in her 39s  . Bone cancer Maternal Uncle   . Bone cancer Maternal Grandfather   . Cancer Cousin        NOS - had a swollen stomach    CURRENT MEDICATIONS:  Outpatient Encounter Medications as of 05/07/2018  Medication Sig  . albuterol (PROAIR HFA) 108 (90 Base) MCG/ACT inhaler INHALE 2 PUFFS BY MOUTH EVERY 6 HOURS IF NEEDED  . [START ON 05/15/2018] ALPRAZolam (XANAX) 1 MG tablet Take 1 tablet (1 mg total) by mouth 2 (two) times daily.  Marland Kitchen aspirin (ASPIRIN LOW DOSE) 81 MG EC tablet Take 81 mg by mouth daily.    . celecoxib (CELEBREX) 400 MG capsule TAKE ONE CAPSULE BY MOUTH ONCE DAILY  . diltiazem (CARDIZEM CD) 120 MG 24 hr capsule TAKE 1 TABLET ONCE DAILY  . docusate sodium (COLACE) 100 MG capsule Take 200 mg by mouth 2 (two) times daily.  Hunt Oris (IMFINZI IV) Inject into the vein.  Marland Kitchen EPINEPHrine 0.3 mg/0.3 mL IJ  SOAJ injection Inject 0.3 mg into the muscle once.  Marland Kitchen FLUoxetine (PROZAC) 20 MG capsule TAKE 1 TABLET ONCE DAILY  . furosemide (LASIX) 20 MG tablet Take 1 tablet (20 mg total) by mouth 2 (two) times daily.  Marland Kitchen guaiFENesin (MUCINEX) 600 MG 12 hr tablet Take 1 tablet (600 mg total) by mouth 2 (two) times daily.  Derrill Memo ON 07/08/2018] HYDROcodone-acetaminophen (NORCO/VICODIN) 5-325 MG tablet Take one tablet three times daily for back pain  . lactulose (CHRONULAC) 10 GM/15ML solution Take 15-19m at bedtime every night to assist with moving bowels.  Titrate down if having multiple bowel movements.  . lidocaine (XYLOCAINE) 2 % solution   . lidocaine-prilocaine (EMLA) cream Apply a quarter size amount to port site 1 hour prior to chemo. Do not rub in. Cover with plastic wrap.  . lisinopril (PRINIVIL,ZESTRIL) 10 MG tablet Take 1 tablet (10 mg total) by mouth daily.  .Marland Kitchenomeprazole (PRILOSEC) 40 MG capsule TAKE 1 CAPSULE ONCE DAILY  . potassium chloride SA (K-DUR,KLOR-CON) 20 MEQ tablet Take 1 tablet (20 mEq total) by mouth daily.  . prochlorperazine (COMPAZINE) 10 MG tablet Take 10 mg by mouth every 6 (six) hours as needed for nausea or vomiting.  . rosuvastatin (CRESTOR) 20 MG tablet TAKE 1 TABLET(20 MG) BY MOUTH DAILY  . SYMBICORT 80-4.5 MCG/ACT inhaler INHALE 2 PUFFS BY MOUTH TWICE DAILY( RINSE MOUTH AFTER USE)  . tiZANidine (ZANAFLEX) 4 MG tablet TAKE 1 TABLET(4 MG) BY MOUTH THREE TIMES DAILY  . [DISCONTINUED] HYDROcodone-acetaminophen (NORCO/VICODIN) 5-325 MG tablet Take one tablet three times daily for back pain  . [DISCONTINUED] HYDROcodone-acetaminophen (NORCO/VICODIN)  5-325 MG tablet Take one tablet one tablet three times daily for back pain   No facility-administered encounter medications on file as of 05/07/2018.     ALLERGIES:  Allergies  Allergen Reactions  . Bee Venom Anaphylaxis  . Codeine   . Penicillins Other (See Comments)    Blisters in mouth after dental work Has patient had a  PCN reaction causing immediate rash, facial/tongue/throat swelling, SOB or lightheadedness with hypotension: Yes Has patient had a PCN reaction causing severe rash involving mucus membranes or skin necrosis: No Has patient had a PCN reaction that required hospitalization No Has patient had a PCN reaction occurring within the last 10 years: Yes If all of the above answers are "NO", then may proceed with Cephalosporin use.      PHYSICAL EXAM:  ECOG Performance status: 1  Vitals:   05/07/18 1113  BP: 132/64  Pulse: (!) 106  Resp: 18  Temp: 98 F (36.7 C)  SpO2: 96%   Filed Weights   05/07/18 1113  Weight: 216 lb 8 oz (98.2 kg)    Physical Exam  Constitutional: She is oriented to person, place, and time. She appears well-developed and well-nourished.  Cardiovascular: Normal rate, regular rhythm and normal heart sounds.  Pulmonary/Chest: Effort normal and breath sounds normal.  Abdominal: Soft.  Musculoskeletal: Normal range of motion.  Neurological: She is alert and oriented to person, place, and time.  Skin: Skin is warm and dry.  Psychiatric: She has a normal mood and affect. Her behavior is normal. Judgment and thought content normal.     LABORATORY DATA:  I have reviewed the labs as listed.  CBC    Component Value Date/Time   WBC 3.8 (L) 05/06/2018 0818   RBC 3.71 (L) 05/06/2018 0818   HGB 10.5 (L) 05/06/2018 0818   HCT 34.7 (L) 05/06/2018 0818   PLT 239 05/06/2018 0818   MCV 93.5 05/06/2018 0818   MCH 28.3 05/06/2018 0818   MCHC 30.3 05/06/2018 0818   RDW 19.1 (H) 05/06/2018 0818   LYMPHSABS 0.7 05/06/2018 0818   MONOABS 0.5 05/06/2018 0818   EOSABS 0.0 05/06/2018 0818   BASOSABS 0.0 05/06/2018 0818   CMP Latest Ref Rng & Units 05/06/2018 03/26/2018 03/21/2018  Glucose 70 - 99 mg/dL 163(H) 109(H) 120(H)  BUN 8 - 23 mg/dL _0 Creatinine 0.44 - 1.00 mg/dL 0.90 0.81 0.82  Sodium 135 - 145 mmol/L 141 136 137  Potassium 3.5 - 5.1 mmol/L 3.6 4.4 4.4   Chloride 98 - 111 mmol/L 110 102 101  CO2 22 - 32 mmol/L _1 Calcium 8.9 - 10.3 mg/dL 8.5(L) 8.9 9.1  Total Protein 6.5 - 8.1 g/dL 6.5 6.2(L) 7.3  Total Bilirubin 0.3 - 1.2 mg/dL 0.4 0.8 0.6  Alkaline Phos 38 - 126 U/L 62 60 66  AST 15 - 41 U/L 14(L) 22 24  ALT 0 - 44 U/L _2 DIAGNOSTIC IMAGING:  Apparently reviewed images of the CT scan of her chest dated 04/30/2018 and discussed with her.     ASSESSMENT & PLAN:   Squamous cell lung cancer, left (H[facility]) 1.  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  She was evaluated by Dr. Roxan Hockey on 01/08/2018 and felt not to be a surgical candidate. - Started on chemoradiation therapy with weekly carboplatin and paclitaxel on 02/11/2018. - She had trouble with swallowing and retrosternal pain over the weekend.  [facility] mouthwash did not help.  Dr.Yanagihara gave her lidocaine 15 mL every 4 hours which is helping.  She is able to eat well now. - She completed chemotherapy on 03/19/2018 and radiation therapy on 03/25/2018.  She has recovered well from side effects of radiation and chemo. - I have discussed the results of the CT scan of the chest dated 04/30/2018 which showed central left upper lobe lung mass has decreased in size to 2.1 x 1.6 cm, from 3.4 x 3 cm.  There is also decreased extrinsic narrowing of the segmental left upper lobe bronchi.  Clustered satellite nodularity in the  posterior left upper lobe has decreased.  No new areas were seen. -I have recommended consolidation immunotherapy with Durvalumab given every 2 weeks for 12 months.  We talked about the immunotherapy related side effects including but not limited to dermatitis, pneumonitis, hypophysitis, colitis, hepatitis, and reactivation of autoimmune conditions.  She understands and gives his permission to proceed with the treatment.  I will see her back in 2 weeks for follow-up.  2.  Family history: The 2 of her sisters died of stomach cancer.  Father died of lung cancer.  Maternal aunt died of thyroid cancer.  Maternal grandfather had cancer in the bone.  Primary is unknown.  We will refer her to our geneticist.       Orders placed this encounter:  Orders Placed This Encounter  Procedures  . CBC with Differential/Platelet  . Comprehensive metabolic panel      Derek Jack, MD [place] [phone]

## 2018-05-07 NOTE — Progress Notes (Signed)
Tolerated infusion w/o adverse reaction.  Alert, in no distress.  VSS.  Discharged ambulatory in c/o spouse.  

## 2018-05-07 NOTE — Progress Notes (Signed)
DISCONTINUE ON PATHWAY REGIMEN - Non-Small Cell Lung     Administer weekly:     Paclitaxel      Carboplatin   **Always confirm dose/schedule in your pharmacy ordering system**  REASON: Continuation Of Treatment PRIOR TREATMENT: [id number]: Carboplatin AUC=2 + Paclitaxel 45 mg/m2 Weekly During Radiation TREATMENT RESPONSE: Partial Response (PR)  START ON PATHWAY REGIMEN - Non-Small Cell Lung     A cycle is every 14 days:     Durvalumab   **Always confirm dose/schedule in your pharmacy ordering system**  Patient Characteristics: Stage III - Unresectable, PS = 0, 1 AJCC T Category: T3 Current Disease Status: No Distant Mets or Local Recurrence AJCC N Category: N1 AJCC M Category: M0 AJCC 8 Stage Grouping: IIIA Performance Status: PS = 0, 1 Intent of Therapy: Non-Curative / Palliative Intent, Discussed with Patient

## 2018-05-07 NOTE — Progress Notes (Signed)
ON PATHWAY REGIMEN - Non-Small Cell Lung  No Change  Continue With Treatment as Ordered.     Administer weekly:     Paclitaxel      Carboplatin   **Always confirm dose/schedule in your pharmacy ordering system**  Patient Characteristics: Stage III - Unresectable, PS = 0, 1 AJCC T Category: T3 Current Disease Status: No Distant Mets or Local Recurrence AJCC N Category: N1 AJCC M Category: M0 AJCC 8 Stage Grouping: IIIA Performance Status: PS = 0, 1 Intent of Therapy: Curative Intent, Discussed with Patient

## 2018-05-07 NOTE — Patient Instructions (Signed)
[place] at [facility] Discharge Instructions  Follow up in 2 weeks with treatment and labs.    Thank you for choosing [place] at [facility] to provide your oncology and hematology care.  To afford each patient quality time with our provider, please arrive at least 15 minutes before your scheduled appointment time.   If you have a lab appointment with the [facility] please come in thru the  Main Entrance and check in at the main information desk  You need to re-schedule your appointment should you arrive 10 or more minutes late.  We strive to give you quality time with our providers, and arriving late affects you and other patients whose appointments are after yours.  Also, if you no show three or more times for appointments you may be dismissed from the clinic at the providers discretion.     Again, thank you for choosing [facility].  Our hope is that these requests will decrease the amount of time that you wait before being seen by our physicians.       _____________________________________________________________  Should you have questions after your visit to [facility], please contact our office at [phone] between the hours of 8:00 a.m. and 4:30 p.m.  Voicemails left after 4:00 p.m. will not be returned until the following business day.  For prescription refill requests, have your pharmacy contact our office and allow 72 hours.    Cancer Center Support Programs:   > Cancer Support Group  2nd Tuesday of the month 1pm-2pm, Journey Room

## 2018-05-08 ENCOUNTER — Telehealth (HOSPITAL_COMMUNITY): Payer: Self-pay | Admitting: *Deleted

## 2018-05-08 NOTE — Telephone Encounter (Signed)
Spoke with pt for 24 hour f/u after first Imfinizi infusion.  Denies any infusion related reactions or s/s. Reports, "I feel great.".  Instructed to call the clinic with any questions or concerns.  Verbalizes understanding.

## 2018-05-11 ENCOUNTER — Encounter: Payer: Self-pay | Admitting: Family Medicine

## 2018-05-21 ENCOUNTER — Encounter (HOSPITAL_COMMUNITY): Payer: Self-pay | Admitting: Hematology

## 2018-05-21 ENCOUNTER — Inpatient Hospital Stay (HOSPITAL_COMMUNITY): Payer: PPO | Attending: Hematology | Admitting: Hematology

## 2018-05-21 ENCOUNTER — Inpatient Hospital Stay (HOSPITAL_COMMUNITY): Payer: PPO

## 2018-05-21 VITALS — BP 136/62 | HR 100 | Temp 97.9°F | Resp 18 | Wt 214.0 lb

## 2018-05-21 VITALS — BP 127/73 | HR 107 | Temp 98.5°F | Resp 18

## 2018-05-21 DIAGNOSIS — M129 Arthropathy, unspecified: Secondary | ICD-10-CM | POA: Insufficient documentation

## 2018-05-21 DIAGNOSIS — D320 Benign neoplasm of cerebral meninges: Secondary | ICD-10-CM | POA: Diagnosis not present

## 2018-05-21 DIAGNOSIS — Z7982 Long term (current) use of aspirin: Secondary | ICD-10-CM | POA: Diagnosis not present

## 2018-05-21 DIAGNOSIS — Z79899 Other long term (current) drug therapy: Secondary | ICD-10-CM | POA: Insufficient documentation

## 2018-05-21 DIAGNOSIS — Z5112 Encounter for antineoplastic immunotherapy: Secondary | ICD-10-CM | POA: Diagnosis not present

## 2018-05-21 DIAGNOSIS — C3412 Malignant neoplasm of upper lobe, left bronchus or lung: Secondary | ICD-10-CM | POA: Diagnosis not present

## 2018-05-21 DIAGNOSIS — Z801 Family history of malignant neoplasm of trachea, bronchus and lung: Secondary | ICD-10-CM | POA: Insufficient documentation

## 2018-05-21 DIAGNOSIS — K219 Gastro-esophageal reflux disease without esophagitis: Secondary | ICD-10-CM

## 2018-05-21 DIAGNOSIS — J449 Chronic obstructive pulmonary disease, unspecified: Secondary | ICD-10-CM | POA: Insufficient documentation

## 2018-05-21 DIAGNOSIS — I10 Essential (primary) hypertension: Secondary | ICD-10-CM | POA: Diagnosis not present

## 2018-05-21 DIAGNOSIS — F17210 Nicotine dependence, cigarettes, uncomplicated: Secondary | ICD-10-CM | POA: Diagnosis not present

## 2018-05-21 DIAGNOSIS — Z800 Family history of malignant neoplasm of digestive organs: Secondary | ICD-10-CM | POA: Insufficient documentation

## 2018-05-21 DIAGNOSIS — E669 Obesity, unspecified: Secondary | ICD-10-CM | POA: Insufficient documentation

## 2018-05-21 DIAGNOSIS — E785 Hyperlipidemia, unspecified: Secondary | ICD-10-CM | POA: Insufficient documentation

## 2018-05-21 DIAGNOSIS — Z9221 Personal history of antineoplastic chemotherapy: Secondary | ICD-10-CM

## 2018-05-21 DIAGNOSIS — F419 Anxiety disorder, unspecified: Secondary | ICD-10-CM | POA: Insufficient documentation

## 2018-05-21 DIAGNOSIS — F319 Bipolar disorder, unspecified: Secondary | ICD-10-CM | POA: Diagnosis not present

## 2018-05-21 DIAGNOSIS — C3492 Malignant neoplasm of unspecified part of left bronchus or lung: Secondary | ICD-10-CM

## 2018-05-21 LAB — CBC WITH DIFFERENTIAL/PLATELET
Abs Immature Granulocytes: 0.01 10*3/uL (ref 0.00–0.07)
BASOS ABS: 0 10*3/uL (ref 0.0–0.1)
Basophils Relative: 1 %
EOS ABS: 0.2 10*3/uL (ref 0.0–0.5)
EOS PCT: 3 %
HEMATOCRIT: 39.2 % (ref 36.0–46.0)
Hemoglobin: 11.4 g/dL — ABNORMAL LOW (ref 12.0–15.0)
Immature Granulocytes: 0 %
LYMPHS ABS: 1.3 10*3/uL (ref 0.7–4.0)
Lymphocytes Relative: 29 %
MCH: 27.5 pg (ref 26.0–34.0)
MCHC: 29.1 g/dL — AB (ref 30.0–36.0)
MCV: 94.5 fL (ref 80.0–100.0)
Monocytes Absolute: 0.6 10*3/uL (ref 0.1–1.0)
Monocytes Relative: 13 %
NRBC: 0 % (ref 0.0–0.2)
Neutro Abs: 2.4 10*3/uL (ref 1.7–7.7)
Neutrophils Relative %: 54 %
Platelets: 270 10*3/uL (ref 150–400)
RBC: 4.15 MIL/uL (ref 3.87–5.11)
RDW: 17.2 % — AB (ref 11.5–15.5)
WBC: 4.5 10*3/uL (ref 4.0–10.5)

## 2018-05-21 LAB — COMPREHENSIVE METABOLIC PANEL
ALBUMIN: 4 g/dL (ref 3.5–5.0)
ALT: 12 U/L (ref 0–44)
ANION GAP: 10 (ref 5–15)
AST: 18 U/L (ref 15–41)
Alkaline Phosphatase: 85 U/L (ref 38–126)
BILIRUBIN TOTAL: 0.3 mg/dL (ref 0.3–1.2)
BUN: 15 mg/dL (ref 8–23)
CO2: 26 mmol/L (ref 22–32)
Calcium: 9 mg/dL (ref 8.9–10.3)
Chloride: 102 mmol/L (ref 98–111)
Creatinine, Ser: 0.96 mg/dL (ref 0.44–1.00)
GFR, EST NON AFRICAN AMERICAN: 59 mL/min — AB (ref >60)
GLUCOSE: 205 mg/dL — AB (ref 70–99)
POTASSIUM: 4.3 mmol/L (ref 3.5–5.1)
Sodium: 138 mmol/L (ref 135–145)
TOTAL PROTEIN: 7.2 g/dL (ref 6.5–8.1)

## 2018-05-21 MED ORDER — HEPARIN SOD (PORK) LOCK FLUSH 100 UNIT/ML IV SOLN
500.0000 [IU] | Freq: Once | INTRAVENOUS | Status: AC | PRN
  Administered 2018-05-21: 500 [IU]

## 2018-05-21 MED ORDER — HEPARIN SOD (PORK) LOCK FLUSH 100 UNIT/ML IV SOLN
INTRAVENOUS | Status: AC
  Filled 2018-05-21: qty 5

## 2018-05-21 MED ORDER — SODIUM CHLORIDE 0.9 % IV SOLN
Freq: Once | INTRAVENOUS | Status: AC
  Administered 2018-05-21: 12:00:00 via INTRAVENOUS

## 2018-05-21 MED ORDER — SODIUM CHLORIDE 0.9% FLUSH
10.0000 mL | INTRAVENOUS | Status: DC | PRN
  Administered 2018-05-21: 10 mL
  Filled 2018-05-21: qty 10

## 2018-05-21 MED ORDER — SODIUM CHLORIDE 0.9 % IV SOLN
10.2000 mg/kg | Freq: Once | INTRAVENOUS | Status: AC
  Administered 2018-05-21: 1000 mg via INTRAVENOUS
  Filled 2018-05-21: qty 20

## 2018-05-21 NOTE — Progress Notes (Signed)
[name], [name] [address]   CLINIC:  Medical Oncology/Hematology  PCP:  Melanie Helper, MD [address], [address] [place] [address] [phone]   REASON FOR VISIT: Follow-up for squamous cell lung cancer  CURRENT THERAPY: Imfinzi   BRIEF ONCOLOGIC HISTORY:    Squamous cell lung cancer, left ([place])   10/28/2017 Imaging    CT scan of the chest shows left hilar mass measuring 3.4 x 2.9 cm, multiple soft tissue masses in the left upper lobe, largest measuring 1.6 cm  MRI of the brain on 10/31/2017 shows dural based focus of contrast enhancement along the superior left convexity, meningioma favored as it was present on MRI in 2009    10/30/2017 Initial Biopsy    Bronchoscopy and biopsy consistent with squamous cell cancer of the lung    11/08/2017 Family History    2 sisters died of stomach cancer Father died of lung cancer in a smoker Maternal aunt died of thyroid cancer Maternal grandfather had cancer spread to the bone    02/05/2018 - 03/25/2018 Chemotherapy    The patient had palonosetron (ALOXI) injection 0.25 mg, 0.25 mg, Intravenous,  Once, 6 of 6 cycles Administration: 0.25 mg (02/11/2018), 0.25 mg (02/18/2018), 0.25 mg (02/25/2018), 0.25 mg (03/04/2018), 0.25 mg (03/12/2018), 0.25 mg (03/19/2018) CARBOplatin (PARAPLATIN) 220 mg in sodium chloride 0.9 % 250 mL chemo infusion, 220 mg (100 % of original dose 215.8 mg), Intravenous,  Once, 6 of 6 cycles Dose modification:   (original dose 215.8 mg, Cycle 1),   (original dose 215.8 mg, Cycle 2), 215.8 mg (original dose 215.8 mg, Cycle 3),   (original dose 215.8 mg, Cycle 4) Administration: 220 mg (02/11/2018), 220 mg (02/18/2018), 220 mg (02/25/2018), 220 mg (03/04/2018), 220 mg (03/12/2018), 220 mg (03/19/2018) PACLitaxel (TAXOL) 96 mg in sodium chloride 0.9 % 250 mL chemo infusion (</= 33m/m2), 45 mg/m2 = 96 mg, Intravenous,  Once, 6 of 6 cycles Administration: 96 mg (02/11/2018), 96 mg  (02/18/2018), 96 mg (02/25/2018), 96 mg (03/04/2018), 96 mg (03/12/2018), 96 mg (03/19/2018)  for chemotherapy treatment.      Malignant neoplasm of upper lobe of left lung ([facility]   01/22/2018 Initial Diagnosis    Malignant neoplasm of upper lobe of left lung ([facility]    05/06/2018 -  Chemotherapy    The patient had durvalumab (IMFINZI) 1,000 mg in sodium chloride 0.9 % 100 mL chemo infusion, 10.2 mg/kg = 980 mg, Intravenous,  Once, 2 of 6 cycles Administration: 1,000 mg (05/07/2018)  for chemotherapy treatment.      INTERVAL HISTORY:  Ms. RGretzinger89y.o. female returns for routine follow-up for squamous cell lung cancer. Patient is here today with her husband and tolerating treatment well. She reports mild fatigue and weakness other than that she is feeling good and ready for her next treatment. Her appetite is 100% and she is still maintaining her weight. Her energy level is 75%. She is trying to remain active and performs all her own ADLs. She denies any new pains. Denies any nausea, vomiting, or diarrhea. Denies any cough or hemoptysis.     REVIEW OF SYSTEMS:  Review of Systems  Constitutional: Positive for fatigue.  Musculoskeletal: Positive for back pain.  Neurological: Positive for extremity weakness.  All other systems reviewed and are negative.    PAST MEDICAL/SURGICAL HISTORY:  Past Medical History:  Diagnosis Date  . Anxiety   . Arthritis   . Bipolar disorder ([facility]   .  Chronic back pain   . COPD (chronic obstructive pulmonary disease) ([place])   . Family history of lung cancer   . Family history of prostate cancer   . Family history of stomach cancer   . Fracture of left ankle Jan 06, 2010   hairline/ Dr. Alfonso Kramer is treating   . GERD (gastroesophageal reflux disease) 2000  . Hyperlipidemia 1995  . Hypertension 1995  . Nicotine addiction   . Obesity    Past Surgical History:  Procedure Laterality Date  . BACK SURGERY  1999  . BRONCHIAL BRUSHINGS Left 10/30/2017   Procedure:  BRONCHIAL BRUSHINGS;  Surgeon: Melanie Du, MD;  [facility];  Service: Cardiopulmonary;  Laterality: Left;  . BRONCHIAL WASHINGS Left 10/30/2017   Procedure: BRONCHIAL WASHINGS;  Surgeon: Melanie Du, MD;  Location: [facility];  Service: Cardiopulmonary;  Laterality: Left;  . CHOLECYSTECTOMY  2001  . COLONOSCOPY N/A 02/02/2016   Procedure: COLONOSCOPY;  Surgeon: Melanie Houston, MD;  Location: [facility];  Service: Endoscopy;  Laterality: N/A;  930  . FLEXIBLE BRONCHOSCOPY Bilateral 10/30/2017   Procedure: FLEXIBLE BRONCHOSCOPY;  Surgeon: Melanie Du, MD;  Location: [facility];  Service: Cardiopulmonary;  Laterality: Bilateral;  . INCISIONAL HERNIA REPAIR  August 17, 2008  . left inguinal hernia herniorrhapy  1980  . PORTACATH PLACEMENT Left 02/03/2018   Procedure: INSERTION PORT-A-CATH;  Surgeon: Melanie Signs, MD;  Location: [facility];  Service: General;  Laterality: Left;  . removal of thmoma  03/27/2010   Dr. Arlyce Kramer   . SPINE SURGERY    . TONSILLECTOMY    . TOTAL ABDOMINAL HYSTERECTOMY W/ BILATERAL SALPINGOOPHORECTOMY  1984     SOCIAL HISTORY:  Social History   Socioeconomic History  . Marital status: Married    Spouse name: Not on file  . Number of children: Not on file  . Years of education: Not on file  . Highest education level: Not on file  Occupational History  . 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 Financial resource strain: Not hard at all  . Food insecurity:    Worry: Never true    Inability: Never true  . Transportation needs:    Medical: No    Non-medical: No  Tobacco Use  . Smoking status: Former Smoker    Packs/day: 1.50    Years: 54.00    Pack years: 81.00    Types: Cigarettes    Last attempt to quit: 08/20/2017    Years since quitting: 0.7  . Smokeless tobacco: Never Used  Substance and Sexual Activity  . Alcohol use: No    Alcohol/week: 0.0 standard drinks  . Drug use:  No  . Sexual activity: Not Currently  Lifestyle  . Physical activity:    Days per week: 0 days    Minutes per session: 0 min  . Stress: Only a little  Relationships  . Social connections:    Talks on phone: More than three times a week    Gets together: More than three times a week    Attends religious service: More than 4 times per year    Active member of club or organization: No    Attends meetings of clubs or organizations: Never    Relationship status: Married  . Intimate partner violence:    Fear of current or ex partner: No    Emotionally abused: No    Physically abused: No    Forced sexual  activity: No  Other Topics Concern  . Not on file  Social History Narrative   Pt had 2 stillborns     FAMILY HISTORY:  Family History  Problem Relation Age of Onset  . Heart disease Mother        enlarged  . Diabetes Mother   . Hypertension Mother   . Cancer Father        throat cancer - smoker  . Hypertension Father   . Coronary artery disease Father   . Diabetes Sister        x3  . Asthma Sister   . Lung cancer Sister 5       d. 85; smoker  . Kidney disease Brother   . Stomach cancer Sister        dx in her 61s-50s  . Stroke Brother   . Prostate cancer Brother 66       pat 1/2 brother  . Thyroid cancer Maternal Aunt        dx and d. in her 24s  . Bone cancer Maternal Uncle   . Bone cancer Maternal Grandfather   . Cancer Cousin        NOS - had a swollen stomach    CURRENT MEDICATIONS:  Outpatient Encounter Medications as of 05/21/2018  Medication Sig  . albuterol (PROAIR HFA) 108 (90 Base) MCG/ACT inhaler INHALE 2 PUFFS BY MOUTH EVERY 6 HOURS IF NEEDED  . ALPRAZolam (XANAX) 1 MG tablet Take 1 tablet (1 mg total) by mouth 2 (two) times daily.  Marland Kitchen aspirin (ASPIRIN LOW DOSE) 81 MG EC tablet Take 81 mg by mouth daily.    . celecoxib (CELEBREX) 400 MG capsule TAKE ONE CAPSULE BY MOUTH ONCE DAILY  . diltiazem (CARDIZEM CD) 120 MG 24 hr capsule TAKE 1 TABLET ONCE  DAILY  . docusate sodium (COLACE) 100 MG capsule Take 200 mg by mouth 2 (two) times daily.  [place] Oris (IMFINZI IV) Inject into the vein.  Marland Kitchen EPINEPHrine 0.3 mg/0.3 mL IJ SOAJ injection Inject 0.3 mg into the muscle once.  Marland Kitchen FLUoxetine (PROZAC) 20 MG capsule TAKE 1 TABLET ONCE DAILY  . furosemide (LASIX) 20 MG tablet Take 1 tablet (20 mg total) by mouth 2 (two) times daily.  Marland Kitchen guaiFENesin (MUCINEX) 600 MG 12 hr tablet Take 1 tablet (600 mg total) by mouth 2 (two) times daily.  Derrill Memo ON 07/08/2018] HYDROcodone-acetaminophen (NORCO/VICODIN) 5-325 MG tablet Take one tablet three times daily for back pain  . lactulose (CHRONULAC) 10 GM/15ML solution Take 15-7m at bedtime every night to assist with moving bowels.  Titrate down if having multiple bowel movements.  . lidocaine (XYLOCAINE) 2 % solution   . lidocaine-prilocaine (EMLA) cream Apply a quarter size amount to port site 1 hour prior to chemo. Do not rub in. Cover with plastic wrap.  . lisinopril (PRINIVIL,ZESTRIL) 10 MG tablet Take 1 tablet (10 mg total) by mouth daily.  .Marland Kitchenomeprazole (PRILOSEC) 40 MG capsule TAKE 1 CAPSULE ONCE DAILY  . potassium chloride SA (K-DUR,KLOR-CON) 20 MEQ tablet Take 1 tablet (20 mEq total) by mouth daily.  . prochlorperazine (COMPAZINE) 10 MG tablet Take 10 mg by mouth every 6 (six) hours as needed for nausea or vomiting.  . rosuvastatin (CRESTOR) 20 MG tablet TAKE 1 TABLET(20 MG) BY MOUTH DAILY  . SYMBICORT 80-4.5 MCG/ACT inhaler INHALE 2 PUFFS BY MOUTH TWICE DAILY( RINSE MOUTH AFTER USE)  . tiZANidine (ZANAFLEX) 4 MG tablet TAKE 1 TABLET(4 MG) BY MOUTH THREE TIMES DAILY  No facility-administered encounter medications on file as of 05/21/2018.     ALLERGIES:  Allergies  Allergen Reactions  . Bee Venom Anaphylaxis  . Codeine   . Penicillins Other (See Comments)    Blisters in mouth after dental work Has patient had a PCN reaction causing immediate rash, facial/tongue/throat swelling, SOB or  lightheadedness with hypotension: Yes Has patient had a PCN reaction causing severe rash involving mucus membranes or skin necrosis: No Has patient had a PCN reaction that required hospitalization No Has patient had a PCN reaction occurring within the last 10 years: Yes If all of the above answers are "NO", then may proceed with Cephalosporin use.      PHYSICAL EXAM:  ECOG Performance status: 1  Vitals:   05/21/18 1019  BP: 136/62  Pulse: 100  Resp: 18  Temp: 97.9 F (36.6 C)  SpO2: 94%   Filed Weights   05/21/18 1019  Weight: 214 lb (97.1 kg)    Physical Exam  Constitutional: She is oriented to person, place, and time. She appears well-developed and well-nourished.  Cardiovascular: Normal rate, regular rhythm and normal heart sounds.  Pulmonary/Chest: Effort normal and breath sounds normal.  Abdominal: Soft.  Musculoskeletal: Normal range of motion.  Neurological: She is alert and oriented to person, place, and time.  Skin: Skin is warm and dry.  Psychiatric: She has a normal mood and affect. Her behavior is normal. Judgment and thought content normal.     LABORATORY DATA:  I have reviewed the labs as listed.  CBC    Component Value Date/Time   WBC 4.5 05/21/2018 0934   RBC 4.15 05/21/2018 0934   HGB 11.4 (L) 05/21/2018 0934   HCT 39.2 05/21/2018 0934   PLT 270 05/21/2018 0934   MCV 94.5 05/21/2018 0934   MCH 27.5 05/21/2018 0934   MCHC 29.1 (L) 05/21/2018 0934   RDW 17.2 (H) 05/21/2018 0934   LYMPHSABS 1.3 05/21/2018 0934   MONOABS 0.6 05/21/2018 0934   EOSABS 0.2 05/21/2018 0934   BASOSABS 0.0 05/21/2018 0934   CMP Latest Ref Rng & Units 05/21/2018 05/06/2018 03/26/2018  Glucose 70 - 99 mg/dL 205(H) 163(H) 109(H)  BUN 8 - 23 mg/dL _0 Creatinine 0.44 - 1.00 mg/dL 0.96 0.90 0.81  Sodium 135 - 145 mmol/L 138 141 136  Potassium 3.5 - 5.1 mmol/L 4.3 3.6 4.4  Chloride 98 - 111 mmol/L 102 110 102  CO2 22 - 32 mmol/L _1 Calcium 8.9 - 10.3 mg/dL  9.0 8.5(L) 8.9  Total Protein 6.5 - 8.1 g/dL 7.2 6.5 6.2(L)  Total Bilirubin 0.3 - 1.2 mg/dL 0.3 0.4 0.8  Alkaline Phos 38 - 126 U/L 85 62 60  AST 15 - 41 U/L 18 14(L) 22  ALT 0 - 44 U/L _2 ASSESSMENT & PLAN:   Squamous cell lung cancer, left ([facility]) 1.  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  She was evaluated by Dr. Roxan Hockey on 01/08/2018 and felt not to be a surgical candidate. - Started on chemoradiation therapy with weekly carboplatin and paclitaxel on 02/11/2018. - She had trouble with swallowing and retrosternal pain over the weekend.  Dukes mouthwash did not help.  Dr.Yanagihara gave her lidocaine 15 mL every 4 hours which is helping.  She is able to eat well now. - She completed chemotherapy on 03/19/2018 and radiation therapy on 03/25/2018.  She has recovered well from side effects of radiation and chemo. - I have discussed the results of the CT scan of the chest dated 04/30/2018 which showed central left upper lobe lung mass has decreased in size to 2.1 x 1.6 cm, from 3.4 x 3 cm.  There is also decreased extrinsic narrowing of the segmental left upper lobe bronchi.  Clustered satellite nodularity in the posterior left upper lobe has decreased.  No new areas were seen. -I have recommended consolidation immunotherapy with Durvalumab given every 2 weeks for 12 months.  We talked about the immunotherapy related side  effects including but not limited to dermatitis, pneumonitis, hypophysitis, colitis, hepatitis, and reactivation of autoimmune conditions. - Durvalumab started on 05/07/2018.  She tolerated it very well.  No reports of cough or diarrhea noted.  I have reviewed her blood work which is stable.  She may proceed with her infusion today.  I will see her back in 4 weeks for follow-up.  We will continue to do CT scans once every 3 months while she is on durvalumab.  2.  Family history: The 2 of her sisters died of stomach cancer.  Father died of lung cancer.  Maternal aunt died of thyroid cancer.  Maternal grandfather had cancer in the bone.  Primary is unknown.  We will refer her to our geneticist.  3.  Iron deficiency state: - Her ferritin was 14.  I have told her to start taking iron tablet daily along with stool softener.       Orders placed this encounter:  Orders Placed This Encounter  Procedures  . CBC with Differential/Platelet  . Comprehensive metabolic panel      Derek Jack, MD [place] [phone]

## 2018-05-21 NOTE — Patient Instructions (Signed)
[facility] Discharge Instructions for Patients Receiving Chemotherapy  Today you received the following chemotherapy agents   To help prevent nausea and vomiting after your treatment, we encourage you to take your nausea medication   If you develop nausea and vomiting that is not controlled by your nausea medication, call the clinic.   BELOW ARE SYMPTOMS THAT SHOULD BE REPORTED IMMEDIATELY:  *FEVER GREATER THAN 100.5 F  *CHILLS WITH OR WITHOUT FEVER  NAUSEA AND VOMITING THAT IS NOT CONTROLLED WITH YOUR NAUSEA MEDICATION  *UNUSUAL SHORTNESS OF BREATH  *UNUSUAL BRUISING OR BLEEDING  TENDERNESS IN MOUTH AND THROAT WITH OR WITHOUT PRESENCE OF ULCERS  *URINARY PROBLEMS  *BOWEL PROBLEMS  UNUSUAL RASH Items with * indicate a potential emergency and should be followed up as soon as possible.  Feel free to call the clinic should you have any questions or concerns. The clinic phone number is [phone].  Please show the CHEMO ALERT CARD at check-in to the Emergency Department and triage nurse.   

## 2018-05-21 NOTE — Assessment & Plan Note (Signed)
1.  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  She was evaluated by Dr. Roxan Hockey on 01/08/2018 and felt not to be a surgical candidate. - Started on chemoradiation therapy with weekly carboplatin and paclitaxel on 02/11/2018. - She had trouble with swallowing and retrosternal pain over the weekend.  Dukes mouthwash did not help.  Dr.Yanagihara gave her lidocaine 15 mL every 4 hours which is helping.  She is able to eat well now. - She completed chemotherapy on 03/19/2018 and radiation therapy on 03/25/2018.  She has recovered well from side effects of radiation and chemo. - I have discussed the results of the CT scan of the chest dated 04/30/2018 which showed central left upper lobe lung mass has decreased in size to 2.1 x 1.6 cm, from 3.4 x 3 cm.  There is also decreased extrinsic narrowing of the segmental left upper lobe bronchi.  Clustered satellite nodularity in the posterior left upper lobe has decreased.  No new areas were seen. -I have recommended consolidation immunotherapy with Durvalumab given every 2 weeks for 12 months.  We talked about the immunotherapy related side effects including but not limited to dermatitis, pneumonitis, hypophysitis, colitis, hepatitis, and reactivation of autoimmune conditions. - Durvalumab started on 05/07/2018.  She tolerated it very well.  No reports of cough or diarrhea noted.  I have reviewed her blood work which  is stable.  She may proceed with her infusion today.  I will see her back in 4 weeks for follow-up.  We will continue to do CT scans once every 3 months while she is on durvalumab.  2.  Family history: The 2 of her sisters died of stomach cancer.  Father died of lung cancer.  Maternal aunt died of thyroid cancer.  Maternal grandfather had cancer in the bone.  Primary is unknown.  We will refer her to our geneticist.  3.  Iron deficiency state: - Her ferritin was 14.  I have told her to start taking iron tablet daily along with stool softener.

## 2018-05-21 NOTE — Progress Notes (Signed)
Labs reviewed and normal. Labs reviewed by MD. Proceed with treatment. No complaints at this time.   Treatment given today per MD orders. Tolerated infusion without adverse affects. Vital signs stable. No complaints at this time. Discharged from clinic ambulatory. F/U with [facility] as scheduled.

## 2018-05-21 NOTE — Patient Instructions (Signed)
[place] at [facility] Discharge Instructions  Follow up in 4 weeks with labs and treatment. Continue on your normal treatment cycle.    Thank you for choosing [place] at [facility] to provide your oncology and hematology care.  To afford each patient quality time with our provider, please arrive at least 15 minutes before your scheduled appointment time.   If you have a lab appointment with the [facility] please come in thru the  Main Entrance and check in at the main information desk  You need to re-schedule your appointment should you arrive 10 or more minutes late.  We strive to give you quality time with our providers, and arriving late affects you and other patients whose appointments are after yours.  Also, if you no show three or more times for appointments you may be dismissed from the clinic at the providers discretion.     Again, thank you for choosing [facility].  Our hope is that these requests will decrease the amount of time that you wait before being seen by our physicians.       _____________________________________________________________  Should you have questions after your visit to [facility], please contact our office at [phone] between the hours of 8:00 a.m. and 4:30 p.m.  Voicemails left after 4:00 p.m. will not be returned until the following business day.  For prescription refill requests, have your pharmacy contact our office and allow 72 hours.    Cancer Center Support Programs:   > Cancer Support Group  2nd Tuesday of the month 1pm-2pm, Journey Room

## 2018-05-27 NOTE — Telephone Encounter (Signed)
w

## 2018-06-02 ENCOUNTER — Telehealth (HOSPITAL_COMMUNITY): Payer: Self-pay | Admitting: *Deleted

## 2018-06-02 ENCOUNTER — Other Ambulatory Visit (HOSPITAL_COMMUNITY): Payer: Self-pay | Admitting: Nurse Practitioner

## 2018-06-02 DIAGNOSIS — C3492 Malignant neoplasm of unspecified part of left bronchus or lung: Secondary | ICD-10-CM

## 2018-06-02 MED ORDER — PREDNISONE 20 MG PO TABS: ORAL_TABLET | ORAL | 0 refills | Status: DC

## 2018-06-03 ENCOUNTER — Ambulatory Visit (HOSPITAL_COMMUNITY)
Admission: RE | Admit: 2018-06-03 | Discharge: 2018-06-03 | Disposition: A | Discharge status: Home / Self Care | Payer: PPO | Source: Ambulatory Visit | Attending: Nurse Practitioner | Admitting: Nurse Practitioner

## 2018-06-03 ENCOUNTER — Other Ambulatory Visit (HOSPITAL_COMMUNITY): Payer: Self-pay

## 2018-06-03 DIAGNOSIS — R05 Cough: Secondary | ICD-10-CM | POA: Diagnosis not present

## 2018-06-03 DIAGNOSIS — C3492 Malignant neoplasm of unspecified part of left bronchus or lung: Secondary | ICD-10-CM

## 2018-06-03 DIAGNOSIS — C3412 Malignant neoplasm of upper lobe, left bronchus or lung: Secondary | ICD-10-CM

## 2018-06-03 DIAGNOSIS — C3490 Malignant neoplasm of unspecified part of unspecified bronchus or lung: Secondary | ICD-10-CM | POA: Insufficient documentation

## 2018-06-04 ENCOUNTER — Other Ambulatory Visit (HOSPITAL_COMMUNITY): Payer: Self-pay

## 2018-06-04 ENCOUNTER — Inpatient Hospital Stay (HOSPITAL_COMMUNITY): Payer: PPO

## 2018-06-04 ENCOUNTER — Other Ambulatory Visit: Payer: Self-pay

## 2018-06-04 ENCOUNTER — Ambulatory Visit (HOSPITAL_COMMUNITY): Payer: Self-pay

## 2018-06-04 ENCOUNTER — Encounter (HOSPITAL_COMMUNITY): Payer: Self-pay

## 2018-06-04 DIAGNOSIS — C3492 Malignant neoplasm of unspecified part of left bronchus or lung: Secondary | ICD-10-CM

## 2018-06-04 DIAGNOSIS — C3412 Malignant neoplasm of upper lobe, left bronchus or lung: Secondary | ICD-10-CM

## 2018-06-04 DIAGNOSIS — Z5112 Encounter for antineoplastic immunotherapy: Secondary | ICD-10-CM | POA: Diagnosis not present

## 2018-06-04 LAB — COMPREHENSIVE METABOLIC PANEL
ALT: 13 U/L (ref 0–44)
ANION GAP: 10 (ref 5–15)
AST: 20 U/L (ref 15–41)
Albumin: 3.6 g/dL (ref 3.5–5.0)
Alkaline Phosphatase: 65 U/L (ref 38–126)
BILIRUBIN TOTAL: 0.4 mg/dL (ref 0.3–1.2)
BUN: 18 mg/dL (ref 8–23)
CO2: 25 mmol/L (ref 22–32)
Calcium: 8.9 mg/dL (ref 8.9–10.3)
Chloride: 106 mmol/L (ref 98–111)
Creatinine, Ser: 0.96 mg/dL (ref 0.44–1.00)
GFR calc Af Amer: >60 mL/min (ref >60)
GFR, EST NON AFRICAN AMERICAN: 59 mL/min — AB (ref >60)
Glucose, Bld: 339 mg/dL — ABNORMAL HIGH (ref 70–99)
Potassium: 4.1 mmol/L (ref 3.5–5.1)
Sodium: 141 mmol/L (ref 135–145)
TOTAL PROTEIN: 6.6 g/dL (ref 6.5–8.1)

## 2018-06-04 LAB — CBC WITH DIFFERENTIAL/PLATELET
Abs Immature Granulocytes: 0.02 10*3/uL (ref 0.00–0.07)
Basophils Absolute: 0 10*3/uL (ref 0.0–0.1)
Basophils Relative: 0 %
EOS PCT: 2 %
Eosinophils Absolute: 0.1 10*3/uL (ref 0.0–0.5)
HEMATOCRIT: 35.3 % — AB (ref 36.0–46.0)
Hemoglobin: 10.3 g/dL — ABNORMAL LOW (ref 12.0–15.0)
Immature Granulocytes: 0 %
Lymphocytes Relative: 8 %
Lymphs Abs: 0.4 10*3/uL — ABNORMAL LOW (ref 0.7–4.0)
MCH: 28.1 pg (ref 26.0–34.0)
MCHC: 29.2 g/dL — ABNORMAL LOW (ref 30.0–36.0)
MCV: 96.4 fL (ref 80.0–100.0)
MONO ABS: 0.1 10*3/uL (ref 0.1–1.0)
MONOS PCT: 1 %
Neutro Abs: 4.6 10*3/uL (ref 1.7–7.7)
Neutrophils Relative %: 89 %
Platelets: 225 10*3/uL (ref 150–400)
RBC: 3.66 MIL/uL — AB (ref 3.87–5.11)
RDW: 17 % — ABNORMAL HIGH (ref 11.5–15.5)
WBC: 5.2 10*3/uL (ref 4.0–10.5)
nRBC: 0 % (ref 0.0–0.2)

## 2018-06-04 LAB — TSH: TSH: 0.199 u[IU]/mL — AB (ref 0.350–4.500)

## 2018-06-04 NOTE — Patient Instructions (Signed)
[place] at [facility] Discharge Instructions  No treatment today.  Follow up as scheduled.   Thank you for choosing [place] at [facility] to provide your oncology and hematology care.  To afford each patient quality time with our provider, please arrive at least 15 minutes before your scheduled appointment time.   If you have a lab appointment with the [facility] please come in thru the  Main Entrance and check in at the main information desk  You need to re-schedule your appointment should you arrive 10 or more minutes late.  We strive to give you quality time with our providers, and arriving late affects you and other patients whose appointments are after yours.  Also, if you no show three or more times for appointments you may be dismissed from the clinic at the providers discretion.     Again, thank you for choosing [facility].  Our hope is that these requests will decrease the amount of time that you wait before being seen by our physicians.       _____________________________________________________________  Should you have questions after your visit to [facility], please contact our office at [phone] between the hours of 8:00 a.m. and 4:30 p.m.  Voicemails left after 4:00 p.m. will not be returned until the following business day.  For prescription refill requests, have your pharmacy contact our office and allow 72 hours.    Cancer Center Support Programs:   > Cancer Support Group  2nd Tuesday of the month 1pm-2pm, Journey Room

## 2018-06-04 NOTE — Telephone Encounter (Signed)
Mrs. Barcelo calls the clinic c/o non-productive cough and wheezing x 3 days.  Denies fever.  C/o fatigue.  Discussed with T. Kefalas, PA-C.  Order placed for CXR and prednisone taper sent to pt's pharmacy.  Pt notified of Rx sent to pharmacy and instructed to pick up medication and start it today. Also instructed to come ASAP for CXR.  She reports she does not feel like getting up and getting dressed and that she will come for CXR tomorrow morning. States she will get her husband to pick up her medication and she will start it today.

## 2018-06-04 NOTE — Progress Notes (Signed)
Patient presented today for treatment cycle three. Patient is currently taking a steriod dose pack, started on Monday the 21st. Patient called clinic to report lots of congestion, cough, chest xray ordered by NP.   Patient only on day three, patient stated she felt pretty good today other than her back hurting. Labs reviewed with Dr. Walden Field and informed her of the above info. Will hold treatment today per MD. Rescheduled for next week to see Dr. Delton Coombes for follow up and tx.   . Vitals stable and discharged home from clinic via wheelchair with son. Follow up as scheduled.

## 2018-06-06 ENCOUNTER — Telehealth: Payer: Self-pay | Admitting: Family Medicine

## 2018-06-06 NOTE — Telephone Encounter (Signed)
Pt is calling wants her Hydrocodone to be sent to [facility] [address] as she has not received it yet

## 2018-06-09 NOTE — Telephone Encounter (Signed)
It was sent to be filled on 9/27. (yesterday) If she still has trouble filling it today I will send the message to dr simpson

## 2018-06-10 ENCOUNTER — Other Ambulatory Visit: Payer: Self-pay | Admitting: Family Medicine

## 2018-06-10 ENCOUNTER — Telehealth: Payer: Self-pay | Admitting: Family Medicine

## 2018-06-10 DIAGNOSIS — E785 Hyperlipidemia, unspecified: Secondary | ICD-10-CM

## 2018-06-10 DIAGNOSIS — I10 Essential (primary) hypertension: Secondary | ICD-10-CM

## 2018-06-10 DIAGNOSIS — Z1231 Encounter for screening mammogram for malignant neoplasm of breast: Secondary | ICD-10-CM

## 2018-06-10 DIAGNOSIS — R7301 Impaired fasting glucose: Secondary | ICD-10-CM

## 2018-06-10 MED ORDER — OMEPRAZOLE 40 MG PO CPDR: 40.0000 mg | DELAYED_RELEASE_CAPSULE | Freq: Every day | ORAL | 1 refills | Status: DC

## 2018-06-10 MED ORDER — POTASSIUM CHLORIDE CRYS ER 20 MEQ PO TBCR: 20.0000 meq | EXTENDED_RELEASE_TABLET | Freq: Every day | ORAL | 1 refills | Status: DC

## 2018-06-10 MED ORDER — FLUOXETINE HCL 20 MG PO CAPS: ORAL_CAPSULE | ORAL | 1 refills | Status: DC

## 2018-06-10 NOTE — Telephone Encounter (Signed)
Please call the pts medicine in [facility] [address] ..Marland Kitchenpotassium, prozac, omepartizole

## 2018-06-10 NOTE — Telephone Encounter (Signed)
Refilled

## 2018-06-11 ENCOUNTER — Telehealth (HOSPITAL_COMMUNITY): Payer: Self-pay

## 2018-06-11 NOTE — Telephone Encounter (Signed)
Late entry from Monday the 28th, patient called and asked if she needed to come for treatment if she was still taking her prednisone pack.? I spoke with her and told her I would speak to the doctor and let him know and call her back.   Called patient back yesterday on the 29th after speaking with MD. Per MD if the patient is on 10 mg of prednisone then she could have treatment, if not then hold. Per pt she is on 40 mg for the next 2 days then goes down to 20 for three days. So we will reschedule for next week. Amy will call and get patient rescheduled and patient is aware of this.

## 2018-06-12 ENCOUNTER — Ambulatory Visit (HOSPITAL_COMMUNITY): Payer: Self-pay

## 2018-06-12 ENCOUNTER — Ambulatory Visit (HOSPITAL_COMMUNITY): Payer: Self-pay | Admitting: Hematology

## 2018-06-12 ENCOUNTER — Other Ambulatory Visit (HOSPITAL_COMMUNITY): Payer: Self-pay

## 2018-06-18 ENCOUNTER — Other Ambulatory Visit (HOSPITAL_COMMUNITY): Payer: Self-pay

## 2018-06-18 ENCOUNTER — Ambulatory Visit (HOSPITAL_COMMUNITY): Payer: Self-pay | Admitting: Hematology

## 2018-06-18 ENCOUNTER — Ambulatory Visit (HOSPITAL_COMMUNITY): Payer: Self-pay

## 2018-06-19 ENCOUNTER — Other Ambulatory Visit: Payer: Self-pay

## 2018-06-19 ENCOUNTER — Inpatient Hospital Stay (HOSPITAL_COMMUNITY): Payer: PPO | Attending: Nurse Practitioner

## 2018-06-19 ENCOUNTER — Encounter (HOSPITAL_COMMUNITY): Payer: Self-pay | Admitting: Hematology

## 2018-06-19 ENCOUNTER — Inpatient Hospital Stay (HOSPITAL_BASED_OUTPATIENT_CLINIC_OR_DEPARTMENT_OTHER): Payer: PPO | Admitting: Hematology

## 2018-06-19 ENCOUNTER — Inpatient Hospital Stay (HOSPITAL_COMMUNITY): Payer: PPO

## 2018-06-19 VITALS — BP 157/65 | HR 110 | Temp 97.5°F | Resp 18 | Wt 229.0 lb

## 2018-06-19 DIAGNOSIS — K219 Gastro-esophageal reflux disease without esophagitis: Secondary | ICD-10-CM

## 2018-06-19 DIAGNOSIS — J449 Chronic obstructive pulmonary disease, unspecified: Secondary | ICD-10-CM

## 2018-06-19 DIAGNOSIS — Z7982 Long term (current) use of aspirin: Secondary | ICD-10-CM | POA: Insufficient documentation

## 2018-06-19 DIAGNOSIS — E669 Obesity, unspecified: Secondary | ICD-10-CM | POA: Insufficient documentation

## 2018-06-19 DIAGNOSIS — Z5112 Encounter for antineoplastic immunotherapy: Secondary | ICD-10-CM

## 2018-06-19 DIAGNOSIS — E785 Hyperlipidemia, unspecified: Secondary | ICD-10-CM

## 2018-06-19 DIAGNOSIS — Z808 Family history of malignant neoplasm of other organs or systems: Secondary | ICD-10-CM | POA: Diagnosis not present

## 2018-06-19 DIAGNOSIS — C3412 Malignant neoplasm of upper lobe, left bronchus or lung: Secondary | ICD-10-CM | POA: Insufficient documentation

## 2018-06-19 DIAGNOSIS — Z87891 Personal history of nicotine dependence: Secondary | ICD-10-CM | POA: Insufficient documentation

## 2018-06-19 DIAGNOSIS — Z800 Family history of malignant neoplasm of digestive organs: Secondary | ICD-10-CM | POA: Diagnosis not present

## 2018-06-19 DIAGNOSIS — Z801 Family history of malignant neoplasm of trachea, bronchus and lung: Secondary | ICD-10-CM | POA: Insufficient documentation

## 2018-06-19 DIAGNOSIS — C3492 Malignant neoplasm of unspecified part of left bronchus or lung: Secondary | ICD-10-CM

## 2018-06-19 DIAGNOSIS — M129 Arthropathy, unspecified: Secondary | ICD-10-CM | POA: Diagnosis not present

## 2018-06-19 DIAGNOSIS — I10 Essential (primary) hypertension: Secondary | ICD-10-CM | POA: Insufficient documentation

## 2018-06-19 DIAGNOSIS — F419 Anxiety disorder, unspecified: Secondary | ICD-10-CM | POA: Insufficient documentation

## 2018-06-19 DIAGNOSIS — Z79899 Other long term (current) drug therapy: Secondary | ICD-10-CM | POA: Diagnosis not present

## 2018-06-19 DIAGNOSIS — Z9221 Personal history of antineoplastic chemotherapy: Secondary | ICD-10-CM

## 2018-06-19 LAB — CBC WITH DIFFERENTIAL/PLATELET
Abs Immature Granulocytes: 0.04 10*3/uL (ref 0.00–0.07)
Basophils Absolute: 0 10*3/uL (ref 0.0–0.1)
Basophils Relative: 1 %
Eosinophils Absolute: 0 10*3/uL (ref 0.0–0.5)
Eosinophils Relative: 0 %
HCT: 38.8 % (ref 36.0–46.0)
Hemoglobin: 10.9 g/dL — ABNORMAL LOW (ref 12.0–15.0)
Immature Granulocytes: 1 %
Lymphocytes Relative: 9 %
Lymphs Abs: 0.6 10*3/uL — ABNORMAL LOW (ref 0.7–4.0)
MCH: 26.4 pg (ref 26.0–34.0)
MCHC: 28.1 g/dL — ABNORMAL LOW (ref 30.0–36.0)
MCV: 93.9 fL (ref 80.0–100.0)
Monocytes Absolute: 0.2 10*3/uL (ref 0.1–1.0)
Monocytes Relative: 4 %
Neutro Abs: 5.4 10*3/uL (ref 1.7–7.7)
Neutrophils Relative %: 85 %
Platelets: 228 10*3/uL (ref 150–400)
RBC: 4.13 MIL/uL (ref 3.87–5.11)
RDW: 17.7 % — ABNORMAL HIGH (ref 11.5–15.5)
WBC: 6.3 10*3/uL (ref 4.0–10.5)
nRBC: 0 % (ref 0.0–0.2)

## 2018-06-19 LAB — COMPREHENSIVE METABOLIC PANEL
ALT: 16 U/L (ref 0–44)
AST: 18 U/L (ref 15–41)
Albumin: 3.9 g/dL (ref 3.5–5.0)
Alkaline Phosphatase: 85 U/L (ref 38–126)
Anion gap: 9 (ref 5–15)
BUN: 23 mg/dL (ref 8–23)
CO2: 22 mmol/L (ref 22–32)
Calcium: 9.3 mg/dL (ref 8.9–10.3)
Chloride: 107 mmol/L (ref 98–111)
Creatinine, Ser: 0.75 mg/dL (ref 0.44–1.00)
GFR calc Af Amer: >60 mL/min (ref >60)
GFR calc non Af Amer: >60 mL/min (ref >60)
Glucose, Bld: 259 mg/dL — ABNORMAL HIGH (ref 70–99)
Potassium: 4.5 mmol/L (ref 3.5–5.1)
Sodium: 138 mmol/L (ref 135–145)
Total Bilirubin: 0.7 mg/dL (ref 0.3–1.2)
Total Protein: 6.9 g/dL (ref 6.5–8.1)

## 2018-06-19 MED ORDER — SODIUM CHLORIDE 0.9% FLUSH
10.0000 mL | INTRAVENOUS | Status: DC | PRN
  Administered 2018-06-19: 10 mL
  Filled 2018-06-19: qty 10

## 2018-06-19 MED ORDER — SODIUM CHLORIDE 0.9 % IV SOLN
10.2000 mg/kg | Freq: Once | INTRAVENOUS | Status: AC
  Administered 2018-06-19: 1000 mg via INTRAVENOUS
  Filled 2018-06-19: qty 20

## 2018-06-19 MED ORDER — SODIUM CHLORIDE 0.9 % IV SOLN
Freq: Once | INTRAVENOUS | Status: AC
  Administered 2018-06-19: 09:00:00 via INTRAVENOUS

## 2018-06-19 MED ORDER — HEPARIN SOD (PORK) LOCK FLUSH 100 UNIT/ML IV SOLN
INTRAVENOUS | Status: AC
  Filled 2018-06-19: qty 5

## 2018-06-19 MED ORDER — HEPARIN SOD (PORK) LOCK FLUSH 100 UNIT/ML IV SOLN
500.0000 [IU] | Freq: Once | INTRAVENOUS | Status: AC | PRN
  Administered 2018-06-19: 500 [IU]

## 2018-06-19 NOTE — Progress Notes (Signed)
[place], [place] [address]   CLINIC:  Medical Oncology/Hematology  PCP:  Melanie Helper, MD [address] [place] [address] [phone]   REASON FOR VISIT: Follow-up for squamous cell lung cancer  CURRENT THERAPY: Imfinzi   BRIEF ONCOLOGIC HISTORY:    Squamous cell lung cancer, left ([place])   10/28/2017 Imaging    CT scan of the chest shows left hilar mass measuring 3.4 x 2.9 cm, multiple soft tissue masses in the left upper lobe, largest measuring 1.6 cm  MRI of the brain on 10/31/2017 shows dural based focus of contrast enhancement along the superior left convexity, meningioma favored as it was present on MRI in 2009    10/30/2017 Initial Biopsy    Bronchoscopy and biopsy consistent with squamous cell cancer of the lung    11/08/2017 Family History    2 sisters died of stomach cancer Father died of lung cancer in a smoker Maternal aunt died of thyroid cancer Maternal grandfather had cancer spread to the bone    02/05/2018 - 03/25/2018 Chemotherapy    The patient had palonosetron (ALOXI) injection 0.25 mg, 0.25 mg, Intravenous,  Once, 6 of 6 cycles Administration: 0.25 mg (02/11/2018), 0.25 mg (02/18/2018), 0.25 mg (02/25/2018), 0.25 mg (03/04/2018), 0.25 mg (03/12/2018), 0.25 mg (03/19/2018) CARBOplatin (PARAPLATIN) 220 mg in sodium chloride 0.9 % 250 mL chemo infusion, 220 mg (100 % of original dose 215.8 mg), Intravenous,  Once, 6 of 6 cycles Dose modification:   (original dose 215.8 mg, Cycle 1),   (original dose 215.8 mg, Cycle 2), 215.8 mg (original dose 215.8 mg, Cycle 3),   (original dose 215.8 mg, Cycle 4) Administration: 220 mg (02/11/2018), 220 mg (02/18/2018), 220 mg (02/25/2018), 220 mg (03/04/2018), 220 mg (03/12/2018), 220 mg (03/19/2018) PACLitaxel (TAXOL) 96 mg in sodium chloride 0.9 % 250 mL chemo infusion (</= 78m/m2), 45 mg/m2 = 96 mg, Intravenous,  Once, 6 of 6 cycles Administration: 96 mg (02/11/2018), 96 mg  (02/18/2018), 96 mg (02/25/2018), 96 mg (03/04/2018), 96 mg (03/12/2018), 96 mg (03/19/2018)  for chemotherapy treatment.      Malignant neoplasm of upper lobe of left lung ([facility]   01/22/2018 Initial Diagnosis    Malignant neoplasm of upper lobe of left lung ([facility]    05/06/2018 -  Chemotherapy    The patient had durvalumab (IMFINZI) 1,000 mg in sodium chloride 0.9 % 100 mL chemo infusion, 10.2 mg/kg = 980 mg, Intravenous,  Once, 3 of 6 cycles Administration: 1,000 mg (05/07/2018), 1,000 mg (05/21/2018), 1,000 mg (06/19/2018)  for chemotherapy treatment.       INTERVAL HISTORY:  [name]. female returns for routine follow-up squamous cell lung cancer. Patient is here today with her husband and doing well. She is tolerating treatment well. She had cough and congestion for a few weeks and it has cleared up and is better now. She has no other complaints at this time and is ready for her next treatment. She denies any nausea, vomiting, diarrhea. Denies any new productive cough or hemoptysis. Denies any bleeding or dark stools. She reports her appetite at 100% and is maintaining her weight well. Her energy level is 75%.     REVIEW OF SYSTEMS:  Review of Systems  All other systems reviewed and are negative.    PAST MEDICAL/SURGICAL HISTORY:  Past Medical History:  Diagnosis Date  . Anxiety   . Arthritis   . Bipolar disorder ([facility]   .  Chronic back pain   . COPD (chronic obstructive pulmonary disease) ([facility])   . Family history of lung cancer   . Family history of prostate cancer   . Family history of stomach cancer   . Fracture of left ankle Jan 06, 2010   hairline/ Dr. Alfonso Kramer is treating   . GERD (gastroesophageal reflux disease) 2000  . Hyperlipidemia 1995  . Hypertension 1995  . Nicotine addiction   . Obesity    Past Surgical History:  Procedure Laterality Date  . BACK SURGERY  1999  . BRONCHIAL BRUSHINGS Left 10/30/2017   Procedure: BRONCHIAL BRUSHINGS;  Surgeon: Melanie Du, MD;  [facility];  Service: Cardiopulmonary;  Laterality: Left;  . BRONCHIAL WASHINGS Left 10/30/2017   Procedure: BRONCHIAL WASHINGS;  Surgeon: Melanie Du, MD;  Location: [facility];  Service: Cardiopulmonary;  Laterality: Left;  . CHOLECYSTECTOMY  2001  . COLONOSCOPY N/A 02/02/2016   Procedure: COLONOSCOPY;  Surgeon: Melanie Houston, MD;  Location: [facility];  Service: Endoscopy;  Laterality: N/A;  930  . FLEXIBLE BRONCHOSCOPY Bilateral 10/30/2017   Procedure: FLEXIBLE BRONCHOSCOPY;  Surgeon: Melanie Du, MD;  Location: [facility];  Service: Cardiopulmonary;  Laterality: Bilateral;  . INCISIONAL HERNIA REPAIR  August 17, 2008  . left inguinal hernia herniorrhapy  1980  . PORTACATH PLACEMENT Left 02/03/2018   Procedure: INSERTION PORT-A-CATH;  Surgeon: Melanie Signs, MD;  Location: [facility];  Service: General;  Laterality: Left;  . removal of thmoma  03/27/2010   Dr. Arlyce Kramer   . SPINE SURGERY    . TONSILLECTOMY    . TOTAL ABDOMINAL HYSTERECTOMY W/ BILATERAL SALPINGOOPHORECTOMY  1984     SOCIAL HISTORY:  Social History   Socioeconomic History  . Marital status: Married    Spouse name: Not on file  . Number of children: Not on file  . Years of education: Not on file  . Highest education level: Not on file  Occupational History  . 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 Financial resource strain: Not hard at all  . Food insecurity:    Worry: Never true    Inability: Never true  . Transportation needs:    Medical: No    Non-medical: No  Tobacco Use  . Smoking status: Former Smoker    Packs/day: 1.50    Years: 54.00    Pack years: 81.00    Types: Cigarettes    Last attempt to quit: 08/20/2017    Years since quitting: 0.8  . Smokeless tobacco: Never Used  Substance and Sexual Activity  . Alcohol use: No    Alcohol/week: 0.0 standard drinks  . Drug use: No  . Sexual activity: Not Currently    Lifestyle  . Physical activity:    Days per week: 0 days    Minutes per session: 0 min  . Stress: Only a little  Relationships  . Social connections:    Talks on phone: More than three times a week    Gets together: More than three times a week    Attends religious service: More than 4 times per year    Active member of club or organization: No    Attends meetings of clubs or organizations: Never    Relationship status: Married  . Intimate partner violence:    Fear of current or ex partner: No    Emotionally abused: No    Physically abused: No    Forced  sexual activity: No  Other Topics Concern  . Not on file  Social History Narrative   Pt had 2 stillborns     FAMILY HISTORY:  Family History  Problem Relation Age of Onset  . Heart disease Mother        enlarged  . Diabetes Mother   . Hypertension Mother   . Cancer Father        throat cancer - smoker  . Hypertension Father   . Coronary artery disease Father   . Diabetes Sister        x3  . Asthma Sister   . Lung cancer Sister 38       d. 29; smoker  . Kidney disease Brother   . Stomach cancer Sister        dx in her 52s-50s  . Stroke Brother   . Prostate cancer Brother 61       pat 1/2 brother  . Thyroid cancer Maternal Aunt        dx and d. in her 26s  . Bone cancer Maternal Uncle   . Bone cancer Maternal Grandfather   . Cancer Cousin        NOS - had a swollen stomach    CURRENT MEDICATIONS:  Outpatient Encounter Medications as of 06/19/2018  Medication Sig  . albuterol (PROAIR HFA) 108 (90 Base) MCG/ACT inhaler INHALE 2 PUFFS BY MOUTH EVERY 6 HOURS IF NEEDED  . ALPRAZolam (XANAX) 1 MG tablet Take 1 tablet (1 mg total) by mouth 2 (two) times daily.  Marland Kitchen aspirin (ASPIRIN LOW DOSE) 81 MG EC tablet Take 81 mg by mouth daily.    . celecoxib (CELEBREX) 400 MG capsule TAKE ONE CAPSULE BY MOUTH ONCE DAILY  . diltiazem (CARDIZEM CD) 120 MG 24 hr capsule TAKE 1 TABLET ONCE DAILY  . docusate sodium (COLACE) 100 MG  capsule Take 200 mg by mouth 2 (two) times daily.  Hunt Oris (IMFINZI IV) Inject into the vein.  Marland Kitchen EPINEPHrine 0.3 mg/0.3 mL IJ SOAJ injection Inject 0.3 mg into the muscle once.  Marland Kitchen FLUoxetine (PROZAC) 20 MG capsule TAKE 1 TABLET ONCE DAILY  . furosemide (LASIX) 20 MG tablet Take 1 tablet (20 mg total) by mouth 2 (two) times daily. (Patient not taking: Reported on 06/04/2018)  . guaiFENesin (MUCINEX) 600 MG 12 hr tablet Take 1 tablet (600 mg total) by mouth 2 (two) times daily.  Derrill Memo ON 07/08/2018] HYDROcodone-acetaminophen (NORCO/VICODIN) 5-325 MG tablet Take one tablet three times daily for back pain  . lactulose (CHRONULAC) 10 GM/15ML solution Take 15-63m at bedtime every night to assist with moving bowels.  Titrate down if having multiple bowel movements.  . lidocaine (XYLOCAINE) 2 % solution   . lidocaine-prilocaine (EMLA) cream Apply a quarter size amount to port site 1 hour prior to chemo. Do not rub in. Cover with plastic wrap.  . lisinopril (PRINIVIL,ZESTRIL) 10 MG tablet Take 1 tablet (10 mg total) by mouth daily.  .Marland Kitchenomeprazole (PRILOSEC) 40 MG capsule Take 1 capsule (40 mg total) by mouth daily.  . potassium chloride SA (K-DUR,KLOR-CON) 20 MEQ tablet Take 1 tablet (20 mEq total) by mouth daily.  . predniSONE (DELTASONE) 20 MG tablet Take 828mPO QD x 3 days, 6035mD x 3days, 25m76m x3 days, 20mg68mx 3 days, 10mg 49m 6 days  . prochlorperazine (COMPAZINE) 10 MG tablet Take 10 mg by mouth every 6 (six) hours as needed for nausea or vomiting.  . rosuvastatin (CRESTOR)  20 MG tablet TAKE 1 TABLET(20 MG) BY MOUTH DAILY  . SYMBICORT 80-4.5 MCG/ACT inhaler INHALE 2 PUFFS BY MOUTH TWICE DAILY( RINSE MOUTH AFTER USE)  . tiZANidine (ZANAFLEX) 4 MG tablet TAKE 1 TABLET(4 MG) BY MOUTH THREE TIMES DAILY   Facility-Administered Encounter Medications as of 06/19/2018  Medication  . [COMPLETED] 0.9 %  sodium chloride infusion  . [COMPLETED] durvalumab (IMFINZI) 1,000 mg in sodium chloride  0.9 % 100 mL chemo infusion  . [COMPLETED] heparin lock flush 100 unit/mL  . sodium chloride flush (NS) 0.9 % injection 10 mL    ALLERGIES:  Allergies  Allergen Reactions  . Bee Venom Anaphylaxis  . Codeine   . Penicillins Other (See Comments)    Blisters in mouth after dental work Has patient had a PCN reaction causing immediate rash, facial/tongue/throat swelling, SOB or lightheadedness with hypotension: Yes Has patient had a PCN reaction causing severe rash involving mucus membranes or skin necrosis: No Has patient had a PCN reaction that required hospitalization No Has patient had a PCN reaction occurring within the last 10 years: Yes If all of the above answers are "NO", then may proceed with Cephalosporin use.      PHYSICAL EXAM:  ECOG Performance status: 1  VITAL Kramer: BP:176/67, P:123, R:18, T:97.5, SATS:95% BEEFEO:712  Physical Exam  Constitutional: She is oriented to person, place, and time. She appears well-developed and well-nourished.  Cardiovascular: Normal rate, regular rhythm and normal heart sounds.  Pulmonary/Chest: Effort normal and breath sounds normal.  Musculoskeletal: Normal range of motion.  Neurological: She is alert and oriented to person, place, and time.  Skin: Skin is warm and dry.  Psychiatric: She has a normal mood and affect. Her behavior is normal. Judgment and thought content normal.     LABORATORY DATA:  I have reviewed the labs as listed.  CBC    Component Value Date/Time   WBC 6.3 06/19/2018 0807   RBC 4.13 06/19/2018 0807   HGB 10.9 (L) 06/19/2018 0807   HCT 38.8 06/19/2018 0807   PLT 228 06/19/2018 0807   MCV 93.9 06/19/2018 0807   MCH 26.4 06/19/2018 0807   MCHC 28.1 (L) 06/19/2018 0807   RDW 17.7 (H) 06/19/2018 0807   LYMPHSABS 0.6 (L) 06/19/2018 0807   MONOABS 0.2 06/19/2018 0807   EOSABS 0.0 06/19/2018 0807   BASOSABS 0.0 06/19/2018 0807   CMP Latest Ref Rng & Units 06/19/2018 06/04/2018 05/21/2018  Glucose 70 - 99  mg/dL 259(H) 339(H) 205(H)  BUN 8 - 23 mg/dL _0 Creatinine 0.44 - 1.00 mg/dL 0.75 0.96 0.96  Sodium 135 - 145 mmol/L 138 141 138  Potassium 3.5 - 5.1 mmol/L 4.5 4.1 4.3  Chloride 98 - 111 mmol/L 107 106 102  CO2 22 - 32 mmol/L _1 Calcium 8.9 - 10.3 mg/dL 9.3 8.9 9.0  Total Protein 6.5 - 8.1 g/dL 6.9 6.6 7.2  Total Bilirubin 0.3 - 1.2 mg/dL 0.7 0.4 0.3  Alkaline Phos 38 - 126 U/L 85 65 85  AST 15 - 41 U/L _2 ALT 0 - 44 U/L _3 ASSESSMENT & PLAN:   Squamous cell lung cancer, left ([facility]) 1.  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  She was evaluated by Dr. Roxan Hockey on 01/08/2018 and felt not to be a surgical candidate. - Started on chemoradiation therapy with weekly carboplatin and paclitaxel on 02/11/2018. - She had trouble with swallowing and retrosternal pain over the weekend.  Dukes mouthwash did not help.  Dr.Yanagihara gave her lidocaine 15 mL every 4 hours which is helping.  She is able to eat well now. - She completed chemotherapy on 03/19/2018 and radiation therapy on 03/25/2018.  She has recovered well from side effects of radiation and chemo. - I have discussed the results of the CT scan of the chest dated 04/30/2018 which showed central left upper lobe lung mass has decreased in size to 2.1 x 1.6 cm, from 3.4 x 3 cm.  There is also decreased extrinsic narrowing of the segmental left upper  lobe bronchi.  Clustered satellite nodularity in the posterior left upper lobe has decreased.  No new areas were seen. -I have recommended consolidation immunotherapy with Durvalumab given every 2 weeks for 12 months.  We talked about the immunotherapy related side effects including but not limited to dermatitis, pneumonitis, hypophysitis, colitis, hepatitis, and reactivation of autoimmune conditions. - Durvalumab was started on 05/07/2018.  Last dose was on 05/21/2018. - Prior to her subsequent dose, she developed cough.  A chest x-ray was done which did not show any pneumonitis.  She was given tapering doses of steroids.  Hence she did not receive her subsequent dose of Durvalumab. -Her respiratory symptoms have completely resolved.  She has been off of prednisone.  She may proceed with her durvalumab today. - I plan to repeat her scans in 6 weeks.  I will see her back after that.  2.  Family history: The 2 of her sisters died of stomach cancer.  Father died of lung cancer.  Maternal aunt died of thyroid cancer.  Maternal grandfather had cancer in the bone.  Primary is unknown.  We will refer her to our geneticist.  3.  Iron deficiency state: - Her ferritin was 14.  She will continue iron tablet along with stool softeners.       Orders placed this encounter:  Orders Placed This Encounter  Procedures  . CT Chest W Contrast  . CT Abdomen Pelvis W Contrast  . TSH  . CBC with Differential/Platelet  . Comprehensive metabolic panel      Derek Jack, MD [place] [phone]

## 2018-06-19 NOTE — Assessment & Plan Note (Signed)
1.  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  She was evaluated by Dr. Roxan Hockey on 01/08/2018 and felt not to be a surgical candidate. - Started on chemoradiation therapy with weekly carboplatin and paclitaxel on 02/11/2018. - She had trouble with swallowing and retrosternal pain over the weekend.  Dukes mouthwash did not help.  Dr.Yanagihara gave her lidocaine 15 mL every 4 hours which is helping.  She is able to eat well now. - She completed chemotherapy on 03/19/2018 and radiation therapy on 03/25/2018.  She has recovered well from side effects of radiation and chemo. - I have discussed the results of the CT scan of the chest dated 04/30/2018 which showed central left upper lobe lung mass has decreased in size to 2.1 x 1.6 cm, from 3.4 x 3 cm.  There is also decreased extrinsic narrowing of the segmental left upper lobe bronchi.  Clustered satellite nodularity in the posterior left upper lobe has decreased.  No new areas were seen. -I have recommended consolidation immunotherapy with Durvalumab given every 2 weeks for 12 months.  We talked about the immunotherapy related side effects including but not limited to dermatitis, pneumonitis, hypophysitis, colitis, hepatitis, and reactivation of autoimmune conditions. - Durvalumab was started on 05/07/2018.  Last dose was on 05/21/2018. - Prior to her subsequent dose, she developed cough.  A chest x-ray was  done which did not show any pneumonitis.  She was given tapering doses of steroids.  Hence she did not receive her subsequent dose of Durvalumab. -Her respiratory symptoms have completely resolved.  She has been off of prednisone.  She may proceed with her durvalumab today. - I plan to repeat her scans in 6 weeks.  I will see her back after that.  2.  Family history: The 2 of her sisters died of stomach cancer.  Father died of lung cancer.  Maternal aunt died of thyroid cancer.  Maternal grandfather had cancer in the bone.  Primary is unknown.  We will refer her to our geneticist.  3.  Iron deficiency state: - Her ferritin was 14.  She will continue iron tablet along with stool softeners.

## 2018-06-19 NOTE — Patient Instructions (Signed)
[facility] Discharge Instructions for Patients Receiving Chemotherapy   Beginning January 23rd 2017 lab work for the Cancer Center will be done in the  Main lab at [place] [facility] on 1st floor. If you have a lab appointment with the Cancer Center please come in thru the  Main Entrance and check in at the main information desk   Today you received the following chemotherapy agents Imfinzi  To help prevent nausea and vomiting after your treatment, we encourage you to take your nausea medication   If you develop nausea and vomiting, or diarrhea that is not controlled by your medication, call the clinic.  The clinic phone number is [phone]. Office hours are Monday-Friday 8:30am-5:00pm.  BELOW ARE SYMPTOMS THAT SHOULD BE REPORTED IMMEDIATELY:  *FEVER GREATER THAN 101.0 F  *CHILLS WITH OR WITHOUT FEVER  NAUSEA AND VOMITING THAT IS NOT CONTROLLED WITH YOUR NAUSEA MEDICATION  *UNUSUAL SHORTNESS OF BREATH  *UNUSUAL BRUISING OR BLEEDING  TENDERNESS IN MOUTH AND THROAT WITH OR WITHOUT PRESENCE OF ULCERS  *URINARY PROBLEMS  *BOWEL PROBLEMS  UNUSUAL RASH Items with * indicate a potential emergency and should be followed up as soon as possible. If you have an emergency after office hours please contact your primary care physician or go to the nearest emergency department.  Please call the clinic during office hours if you have any questions or concerns.   You may also contact the Patient Navigator at [phone] should you have any questions or need assistance in obtaining follow up care.      Resources For Cancer Patients and their Caregivers ? American Cancer Society: Can assist with transportation, wigs, general needs, runs Look Good Feel Better.        1-888-227-6333 ? Cancer Care: Provides financial assistance, online support groups, medication/co-pay assistance.  1-800-813-HOPE (4673) ? [facility] Assists [place] cancer  patients and their families through emotional , educational and financial support.  336-427-4357 ? [place] DSS Where to apply for food stamps, Medicaid and utility assistance. 336-342-1394 ? [facility]: Transportation to medical appointments. 336-347-2287 ? Social Security Administration: May apply for disability if have a Stage IV cancer. 336-342-7796 1-800-772-1213 ? [place] Aging, Disability and Transit Services: Assists with nutrition, care and transit needs. 336-349-2343          

## 2018-06-19 NOTE — Progress Notes (Signed)
0900 labs and vitals reviewed and pt seen by Dr. Delton Coombes who approved pt for Imfinzi treatment today.   [place] tolerated Imfinzi without incident or complaint. VSS upon completion of treatment. Discharged self ambulatory in satisfactory condition in presence of husband.

## 2018-06-19 NOTE — Patient Instructions (Addendum)
[place] at [facility] Discharge Instructions  Follow up in 6 weeks with labs and scans   Thank you for choosing [place] at [facility] to provide your oncology and hematology care.  To afford each patient quality time with our provider, please arrive at least 15 minutes before your scheduled appointment time.   If you have a lab appointment with the [facility] please come in thru the  Main Entrance and check in at the main information desk  You need to re-schedule your appointment should you arrive 10 or more minutes late.  We strive to give you quality time with our providers, and arriving late affects you and other patients whose appointments are after yours.  Also, if you no show three or more times for appointments you may be dismissed from the clinic at the providers discretion.     Again, thank you for choosing [facility].  Our hope is that these requests will decrease the amount of time that you wait before being seen by our physicians.       _____________________________________________________________  Should you have questions after your visit to [facility], please contact our office at [phone] between the hours of 8:00 a.m. and 4:30 p.m.  Voicemails left after 4:00 p.m. will not be returned until the following business day.  For prescription refill requests, have your pharmacy contact our office and allow 72 hours.    Cancer Center Support Programs:   > Cancer Support Group  2nd Tuesday of the month 1pm-2pm, Journey Room

## 2018-06-26 ENCOUNTER — Other Ambulatory Visit (HOSPITAL_COMMUNITY): Payer: Self-pay

## 2018-06-26 ENCOUNTER — Ambulatory Visit (HOSPITAL_COMMUNITY): Payer: Self-pay

## 2018-07-03 ENCOUNTER — Inpatient Hospital Stay (HOSPITAL_COMMUNITY): Payer: PPO

## 2018-07-03 VITALS — BP 132/58 | HR 107 | Temp 98.0°F | Resp 18 | Wt 225.2 lb

## 2018-07-03 DIAGNOSIS — C3492 Malignant neoplasm of unspecified part of left bronchus or lung: Secondary | ICD-10-CM

## 2018-07-03 DIAGNOSIS — Z5112 Encounter for antineoplastic immunotherapy: Secondary | ICD-10-CM | POA: Diagnosis not present

## 2018-07-03 DIAGNOSIS — C3412 Malignant neoplasm of upper lobe, left bronchus or lung: Secondary | ICD-10-CM

## 2018-07-03 LAB — CBC WITH DIFFERENTIAL/PLATELET
Abs Immature Granulocytes: 0.02 10*3/uL (ref 0.00–0.07)
BASOS PCT: 1 %
Basophils Absolute: 0 10*3/uL (ref 0.0–0.1)
Eosinophils Absolute: 0.1 10*3/uL (ref 0.0–0.5)
Eosinophils Relative: 3 %
HEMATOCRIT: 39.4 % (ref 36.0–46.0)
Hemoglobin: 11.6 g/dL — ABNORMAL LOW (ref 12.0–15.0)
IMMATURE GRANULOCYTES: 1 %
LYMPHS ABS: 1.2 10*3/uL (ref 0.7–4.0)
Lymphocytes Relative: 30 %
MCH: 27.2 pg (ref 26.0–34.0)
MCHC: 29.4 g/dL — ABNORMAL LOW (ref 30.0–36.0)
MCV: 92.5 fL (ref 80.0–100.0)
MONOS PCT: 12 %
Monocytes Absolute: 0.5 10*3/uL (ref 0.1–1.0)
NEUTROS PCT: 53 %
Neutro Abs: 2.2 10*3/uL (ref 1.7–7.7)
PLATELETS: 256 10*3/uL (ref 150–400)
RBC: 4.26 MIL/uL (ref 3.87–5.11)
RDW: 16.2 % — ABNORMAL HIGH (ref 11.5–15.5)
WBC: 4.1 10*3/uL (ref 4.0–10.5)
nRBC: 0 % (ref 0.0–0.2)

## 2018-07-03 LAB — COMPREHENSIVE METABOLIC PANEL
ALT: 16 U/L (ref 0–44)
AST: 20 U/L (ref 15–41)
Albumin: 4.1 g/dL (ref 3.5–5.0)
Alkaline Phosphatase: 75 U/L (ref 38–126)
Anion gap: 9 (ref 5–15)
BUN: 21 mg/dL (ref 8–23)
CALCIUM: 9.2 mg/dL (ref 8.9–10.3)
CHLORIDE: 105 mmol/L (ref 98–111)
CO2: 23 mmol/L (ref 22–32)
CREATININE: 0.77 mg/dL (ref 0.44–1.00)
GFR calc Af Amer: >60 mL/min (ref >60)
GLUCOSE: 147 mg/dL — AB (ref 70–99)
Potassium: 4.3 mmol/L (ref 3.5–5.1)
Sodium: 137 mmol/L (ref 135–145)
TOTAL PROTEIN: 7.2 g/dL (ref 6.5–8.1)
Total Bilirubin: 0.8 mg/dL (ref 0.3–1.2)

## 2018-07-03 MED ORDER — HEPARIN SOD (PORK) LOCK FLUSH 100 UNIT/ML IV SOLN
500.0000 [IU] | Freq: Once | INTRAVENOUS | Status: AC | PRN
  Administered 2018-07-03: 500 [IU]

## 2018-07-03 MED ORDER — SODIUM CHLORIDE 0.9 % IV SOLN
Freq: Once | INTRAVENOUS | Status: AC
  Administered 2018-07-03: 12:00:00 via INTRAVENOUS

## 2018-07-03 MED ORDER — SODIUM CHLORIDE 0.9% FLUSH
10.0000 mL | INTRAVENOUS | Status: DC | PRN
  Administered 2018-07-03: 10 mL
  Filled 2018-07-03: qty 10

## 2018-07-03 MED ORDER — SODIUM CHLORIDE 0.9 % IV SOLN
10.2000 mg/kg | Freq: Once | INTRAVENOUS | Status: AC
  Administered 2018-07-03: 1000 mg via INTRAVENOUS
  Filled 2018-07-03: qty 20

## 2018-07-03 NOTE — Progress Notes (Signed)
1215 labs and vitals reviewed, including HR 119 which is patients baseline. Pt approved for Imfinzi treatment today.   [name] tolerated Imfinzi without incident or complaint. VSS upon completion of treatment. Discharged self ambulatory in presence of husband.

## 2018-07-03 NOTE — Patient Instructions (Signed)
[facility] Discharge Instructions for Patients Receiving Chemotherapy   Beginning January 23rd 2017 lab work for the Cancer Center will be done in the  Main lab at [facility] on 1st floor. If you have a lab appointment with the Cancer Center please come in thru the  Main Entrance and check in at the main information desk   Today you received the following chemotherapy agents Imfinzi  To help prevent nausea and vomiting after your treatment, we encourage you to take your nausea medication   If you develop nausea and vomiting, or diarrhea that is not controlled by your medication, call the clinic.  The clinic phone number is [phone]. Office hours are Monday-Friday 8:30am-5:00pm.  BELOW ARE SYMPTOMS THAT SHOULD BE REPORTED IMMEDIATELY:  *FEVER GREATER THAN 101.0 F  *CHILLS WITH OR WITHOUT FEVER  NAUSEA AND VOMITING THAT IS NOT CONTROLLED WITH YOUR NAUSEA MEDICATION  *UNUSUAL SHORTNESS OF BREATH  *UNUSUAL BRUISING OR BLEEDING  TENDERNESS IN MOUTH AND THROAT WITH OR WITHOUT PRESENCE OF ULCERS  *URINARY PROBLEMS  *BOWEL PROBLEMS  UNUSUAL RASH Items with * indicate a potential emergency and should be followed up as soon as possible. If you have an emergency after office hours please contact your primary care physician or go to the nearest emergency department.  Please call the clinic during office hours if you have any questions or concerns.   You may also contact the Patient Navigator at [phone] should you have any questions or need assistance in obtaining follow up care.      Resources For Cancer Patients and their Caregivers ? American [employer] Society: Can assist with transportation, wigs, general needs, runs Look Good Feel Better.        1-888-227-6333 ? Cancer Care: Provides financial assistance, online support groups, medication/co-pay assistance.  1-800-813-HOPE (4673) ? [facility] Assists [place] cancer  patients and their families through emotional , educational and financial support.  336-427-4357 ? [place] DSS Where to apply for food stamps, Medicaid and utility assistance. 336-342-1394 ? [facility]: Transportation to medical appointments. 336-347-2287 ? Social Security Administration: May apply for disability if have a Stage IV cancer. 336-342-7796 1-800-772-1213 ? [place] [facility]ging, Disability and Transit Services: Assists with nutrition, care and transit needs. 336-349-2343          

## 2018-07-09 ENCOUNTER — Ambulatory Visit: Payer: Self-pay | Admitting: Family Medicine

## 2018-07-17 ENCOUNTER — Encounter (HOSPITAL_COMMUNITY): Payer: Self-pay

## 2018-07-17 ENCOUNTER — Ambulatory Visit (HOSPITAL_COMMUNITY)
Admission: RE | Admit: 2018-07-17 | Discharge: 2018-07-17 | Disposition: A | Discharge status: Home / Self Care | Payer: PPO | Source: Ambulatory Visit | Attending: Hematology | Admitting: Hematology

## 2018-07-17 ENCOUNTER — Ambulatory Visit (HOSPITAL_COMMUNITY)
Admission: RE | Admit: 2018-07-17 | Discharge: 2018-07-17 | Disposition: A | Discharge status: Home / Self Care | Payer: PPO | Source: Ambulatory Visit | Attending: Nurse Practitioner | Admitting: Nurse Practitioner

## 2018-07-17 ENCOUNTER — Other Ambulatory Visit: Payer: Self-pay

## 2018-07-17 ENCOUNTER — Inpatient Hospital Stay (HOSPITAL_COMMUNITY): Payer: PPO

## 2018-07-17 ENCOUNTER — Inpatient Hospital Stay (HOSPITAL_COMMUNITY): Payer: PPO | Attending: Hematology

## 2018-07-17 VITALS — BP 142/64 | HR 99 | Temp 98.2°F | Resp 18 | Wt 231.6 lb

## 2018-07-17 DIAGNOSIS — J449 Chronic obstructive pulmonary disease, unspecified: Secondary | ICD-10-CM | POA: Diagnosis not present

## 2018-07-17 DIAGNOSIS — Z7982 Long term (current) use of aspirin: Secondary | ICD-10-CM | POA: Insufficient documentation

## 2018-07-17 DIAGNOSIS — C3492 Malignant neoplasm of unspecified part of left bronchus or lung: Secondary | ICD-10-CM | POA: Diagnosis not present

## 2018-07-17 DIAGNOSIS — K219 Gastro-esophageal reflux disease without esophagitis: Secondary | ICD-10-CM | POA: Insufficient documentation

## 2018-07-17 DIAGNOSIS — Z8042 Family history of malignant neoplasm of prostate: Secondary | ICD-10-CM | POA: Diagnosis not present

## 2018-07-17 DIAGNOSIS — J189 Pneumonia, unspecified organism: Secondary | ICD-10-CM | POA: Diagnosis not present

## 2018-07-17 DIAGNOSIS — Z79899 Other long term (current) drug therapy: Secondary | ICD-10-CM | POA: Insufficient documentation

## 2018-07-17 DIAGNOSIS — Z801 Family history of malignant neoplasm of trachea, bronchus and lung: Secondary | ICD-10-CM | POA: Insufficient documentation

## 2018-07-17 DIAGNOSIS — E785 Hyperlipidemia, unspecified: Secondary | ICD-10-CM | POA: Insufficient documentation

## 2018-07-17 DIAGNOSIS — Z9221 Personal history of antineoplastic chemotherapy: Secondary | ICD-10-CM | POA: Insufficient documentation

## 2018-07-17 DIAGNOSIS — Z87891 Personal history of nicotine dependence: Secondary | ICD-10-CM | POA: Diagnosis not present

## 2018-07-17 DIAGNOSIS — Z808 Family history of malignant neoplasm of other organs or systems: Secondary | ICD-10-CM | POA: Diagnosis not present

## 2018-07-17 DIAGNOSIS — F419 Anxiety disorder, unspecified: Secondary | ICD-10-CM | POA: Insufficient documentation

## 2018-07-17 DIAGNOSIS — R072 Precordial pain: Secondary | ICD-10-CM | POA: Diagnosis not present

## 2018-07-17 DIAGNOSIS — I10 Essential (primary) hypertension: Secondary | ICD-10-CM | POA: Insufficient documentation

## 2018-07-17 DIAGNOSIS — M129 Arthropathy, unspecified: Secondary | ICD-10-CM | POA: Insufficient documentation

## 2018-07-17 DIAGNOSIS — Z800 Family history of malignant neoplasm of digestive organs: Secondary | ICD-10-CM | POA: Diagnosis not present

## 2018-07-17 DIAGNOSIS — G8929 Other chronic pain: Secondary | ICD-10-CM | POA: Diagnosis not present

## 2018-07-17 DIAGNOSIS — C3412 Malignant neoplasm of upper lobe, left bronchus or lung: Secondary | ICD-10-CM | POA: Diagnosis not present

## 2018-07-17 DIAGNOSIS — J90 Pleural effusion, not elsewhere classified: Secondary | ICD-10-CM | POA: Diagnosis not present

## 2018-07-17 DIAGNOSIS — M549 Dorsalgia, unspecified: Secondary | ICD-10-CM | POA: Diagnosis not present

## 2018-07-17 DIAGNOSIS — F319 Bipolar disorder, unspecified: Secondary | ICD-10-CM | POA: Insufficient documentation

## 2018-07-17 DIAGNOSIS — D509 Iron deficiency anemia, unspecified: Secondary | ICD-10-CM | POA: Insufficient documentation

## 2018-07-17 LAB — COMPREHENSIVE METABOLIC PANEL
ALK PHOS: 61 U/L (ref 38–126)
ALT: 12 U/L (ref 0–44)
ANION GAP: 8 (ref 5–15)
AST: 19 U/L (ref 15–41)
Albumin: 3.5 g/dL (ref 3.5–5.0)
BUN: 14 mg/dL (ref 8–23)
CALCIUM: 8.7 mg/dL — AB (ref 8.9–10.3)
CO2: 24 mmol/L (ref 22–32)
CREATININE: 0.77 mg/dL (ref 0.44–1.00)
Chloride: 109 mmol/L (ref 98–111)
GFR calc Af Amer: >60 mL/min (ref >60)
Glucose, Bld: 130 mg/dL — ABNORMAL HIGH (ref 70–99)
Potassium: 3.3 mmol/L — ABNORMAL LOW (ref 3.5–5.1)
Sodium: 141 mmol/L (ref 135–145)
Total Bilirubin: 0.3 mg/dL (ref 0.3–1.2)
Total Protein: 6.5 g/dL (ref 6.5–8.1)

## 2018-07-17 LAB — CBC WITH DIFFERENTIAL/PLATELET
ABS IMMATURE GRANULOCYTES: 0.02 10*3/uL (ref 0.00–0.07)
BASOS PCT: 1 %
Basophils Absolute: 0 10*3/uL (ref 0.0–0.1)
EOS PCT: 2 %
Eosinophils Absolute: 0.1 10*3/uL (ref 0.0–0.5)
HCT: 35.5 % — ABNORMAL LOW (ref 36.0–46.0)
HEMOGLOBIN: 10.5 g/dL — AB (ref 12.0–15.0)
Immature Granulocytes: 1 %
Lymphocytes Relative: 17 %
Lymphs Abs: 0.7 10*3/uL (ref 0.7–4.0)
MCH: 27 pg (ref 26.0–34.0)
MCHC: 29.6 g/dL — AB (ref 30.0–36.0)
MCV: 91.3 fL (ref 80.0–100.0)
MONO ABS: 0.4 10*3/uL (ref 0.1–1.0)
MONOS PCT: 11 %
Neutro Abs: 2.8 10*3/uL (ref 1.7–7.7)
Neutrophils Relative %: 68 %
PLATELETS: 215 10*3/uL (ref 150–400)
RBC: 3.89 MIL/uL (ref 3.87–5.11)
RDW: 16.1 % — ABNORMAL HIGH (ref 11.5–15.5)
WBC: 4.1 10*3/uL (ref 4.0–10.5)
nRBC: 0 % (ref 0.0–0.2)

## 2018-07-17 MED ORDER — IOHEXOL 300 MG/ML  SOLN
75.0000 mL | Freq: Once | INTRAMUSCULAR | Status: AC | PRN
  Administered 2018-07-17: 75 mL via INTRAVENOUS

## 2018-07-17 NOTE — Progress Notes (Signed)
Pt here presents today for Imfinzi Day 1 Cycle 5. VSS. Pt has complaints of SOB and cough since last visit. Initial sat 92%. After rest 98%. Reviewed complaints with Dr. Delton Coombes . VO received to obtain CxR 2 view per MD.   Pt sent to X-ray via wheelchair to obtain chest 2 view per  Dr. Tomie China order.   Per Dr. Genene Churn. Stat CT ordered at this time. Hold treatment today.

## 2018-07-18 ENCOUNTER — Other Ambulatory Visit (HOSPITAL_COMMUNITY): Payer: Self-pay | Admitting: Nurse Practitioner

## 2018-07-18 DIAGNOSIS — C3492 Malignant neoplasm of unspecified part of left bronchus or lung: Secondary | ICD-10-CM

## 2018-07-18 MED ORDER — PREDNISONE 20 MG PO TABS: 100.0000 mg | ORAL_TABLET | Freq: Every day | ORAL | 0 refills | Status: DC

## 2018-07-22 ENCOUNTER — Inpatient Hospital Stay (HOSPITAL_BASED_OUTPATIENT_CLINIC_OR_DEPARTMENT_OTHER): Payer: PPO | Admitting: Hematology

## 2018-07-22 ENCOUNTER — Ambulatory Visit (HOSPITAL_COMMUNITY): Payer: Self-pay

## 2018-07-22 ENCOUNTER — Inpatient Hospital Stay (HOSPITAL_COMMUNITY): Payer: PPO

## 2018-07-22 ENCOUNTER — Inpatient Hospital Stay (HOSPITAL_COMMUNITY)
Admission: AD | Admit: 2018-07-22 | Discharge: 2018-07-25 | DRG: 193 | Disposition: A | Discharge status: Home Health | Payer: PPO | Source: Ambulatory Visit | Attending: Family Medicine | Admitting: Family Medicine

## 2018-07-22 ENCOUNTER — Encounter (HOSPITAL_COMMUNITY): Payer: Self-pay | Admitting: Hematology

## 2018-07-22 ENCOUNTER — Other Ambulatory Visit: Payer: Self-pay

## 2018-07-22 ENCOUNTER — Ambulatory Visit (HOSPITAL_COMMUNITY)
Admission: RE | Admit: 2018-07-22 | Discharge: 2018-07-22 | Disposition: A | Discharge status: Home / Self Care | Payer: PPO | Source: Ambulatory Visit | Attending: Nurse Practitioner | Admitting: Nurse Practitioner

## 2018-07-22 VITALS — BP 154/87 | HR 101 | Temp 97.7°F | Resp 20 | Wt 238.6 lb

## 2018-07-22 DIAGNOSIS — R079 Chest pain, unspecified: Secondary | ICD-10-CM | POA: Diagnosis not present

## 2018-07-22 DIAGNOSIS — M129 Arthropathy, unspecified: Secondary | ICD-10-CM

## 2018-07-22 DIAGNOSIS — I10 Essential (primary) hypertension: Secondary | ICD-10-CM

## 2018-07-22 DIAGNOSIS — E785 Hyperlipidemia, unspecified: Secondary | ICD-10-CM | POA: Diagnosis not present

## 2018-07-22 DIAGNOSIS — R072 Precordial pain: Secondary | ICD-10-CM

## 2018-07-22 DIAGNOSIS — F319 Bipolar disorder, unspecified: Secondary | ICD-10-CM | POA: Diagnosis not present

## 2018-07-22 DIAGNOSIS — K5909 Other constipation: Secondary | ICD-10-CM | POA: Diagnosis not present

## 2018-07-22 DIAGNOSIS — M549 Dorsalgia, unspecified: Secondary | ICD-10-CM | POA: Diagnosis not present

## 2018-07-22 DIAGNOSIS — K219 Gastro-esophageal reflux disease without esophagitis: Secondary | ICD-10-CM | POA: Diagnosis not present

## 2018-07-22 DIAGNOSIS — F419 Anxiety disorder, unspecified: Secondary | ICD-10-CM

## 2018-07-22 DIAGNOSIS — Z8042 Family history of malignant neoplasm of prostate: Secondary | ICD-10-CM | POA: Diagnosis not present

## 2018-07-22 DIAGNOSIS — Z800 Family history of malignant neoplasm of digestive organs: Secondary | ICD-10-CM | POA: Diagnosis not present

## 2018-07-22 DIAGNOSIS — J9601 Acute respiratory failure with hypoxia: Secondary | ICD-10-CM | POA: Diagnosis present

## 2018-07-22 DIAGNOSIS — Z91030 Bee allergy status: Secondary | ICD-10-CM | POA: Diagnosis not present

## 2018-07-22 DIAGNOSIS — F329 Major depressive disorder, single episode, unspecified: Secondary | ICD-10-CM

## 2018-07-22 DIAGNOSIS — F32A Depression, unspecified: Secondary | ICD-10-CM

## 2018-07-22 DIAGNOSIS — R5381 Other malaise: Secondary | ICD-10-CM

## 2018-07-22 DIAGNOSIS — Z9221 Personal history of antineoplastic chemotherapy: Secondary | ICD-10-CM | POA: Diagnosis not present

## 2018-07-22 DIAGNOSIS — Z87891 Personal history of nicotine dependence: Secondary | ICD-10-CM | POA: Diagnosis not present

## 2018-07-22 DIAGNOSIS — I361 Nonrheumatic tricuspid (valve) insufficiency: Secondary | ICD-10-CM | POA: Diagnosis not present

## 2018-07-22 DIAGNOSIS — E669 Obesity, unspecified: Secondary | ICD-10-CM | POA: Diagnosis present

## 2018-07-22 DIAGNOSIS — J189 Pneumonia, unspecified organism: Secondary | ICD-10-CM | POA: Diagnosis not present

## 2018-07-22 DIAGNOSIS — R0602 Shortness of breath: Secondary | ICD-10-CM | POA: Diagnosis present

## 2018-07-22 DIAGNOSIS — Z66 Do not resuscitate: Secondary | ICD-10-CM | POA: Diagnosis present

## 2018-07-22 DIAGNOSIS — G8929 Other chronic pain: Secondary | ICD-10-CM

## 2018-07-22 DIAGNOSIS — Z808 Family history of malignant neoplasm of other organs or systems: Secondary | ICD-10-CM

## 2018-07-22 DIAGNOSIS — Z801 Family history of malignant neoplasm of trachea, bronchus and lung: Secondary | ICD-10-CM | POA: Diagnosis not present

## 2018-07-22 DIAGNOSIS — Z79899 Other long term (current) drug therapy: Secondary | ICD-10-CM | POA: Diagnosis not present

## 2018-07-22 DIAGNOSIS — Z1231 Encounter for screening mammogram for malignant neoplasm of breast: Secondary | ICD-10-CM

## 2018-07-22 DIAGNOSIS — C3412 Malignant neoplasm of upper lobe, left bronchus or lung: Secondary | ICD-10-CM

## 2018-07-22 DIAGNOSIS — C3492 Malignant neoplasm of unspecified part of left bronchus or lung: Secondary | ICD-10-CM

## 2018-07-22 DIAGNOSIS — I119 Hypertensive heart disease without heart failure: Secondary | ICD-10-CM | POA: Diagnosis present

## 2018-07-22 DIAGNOSIS — R7301 Impaired fasting glucose: Secondary | ICD-10-CM

## 2018-07-22 DIAGNOSIS — Z885 Allergy status to narcotic agent status: Secondary | ICD-10-CM

## 2018-07-22 DIAGNOSIS — Z923 Personal history of irradiation: Secondary | ICD-10-CM

## 2018-07-22 DIAGNOSIS — J449 Chronic obstructive pulmonary disease, unspecified: Secondary | ICD-10-CM | POA: Diagnosis not present

## 2018-07-22 DIAGNOSIS — J440 Chronic obstructive pulmonary disease with acute lower respiratory infection: Secondary | ICD-10-CM | POA: Diagnosis not present

## 2018-07-22 DIAGNOSIS — Z880 Allergy status to penicillin: Secondary | ICD-10-CM | POA: Diagnosis not present

## 2018-07-22 DIAGNOSIS — Z7982 Long term (current) use of aspirin: Secondary | ICD-10-CM | POA: Diagnosis not present

## 2018-07-22 DIAGNOSIS — I340 Nonrheumatic mitral (valve) insufficiency: Secondary | ICD-10-CM | POA: Diagnosis not present

## 2018-07-22 LAB — D-DIMER, QUANTITATIVE: D-Dimer, Quant: 1.07 ug/mL-FEU — ABNORMAL HIGH (ref 0.00–0.50)

## 2018-07-22 LAB — COMPREHENSIVE METABOLIC PANEL
ALT: 36 U/L (ref 0–44)
AST: 46 U/L — ABNORMAL HIGH (ref 15–41)
Albumin: 3.4 g/dL — ABNORMAL LOW (ref 3.5–5.0)
Alkaline Phosphatase: 74 U/L (ref 38–126)
Anion gap: 9 (ref 5–15)
BUN: 18 mg/dL (ref 8–23)
CHLORIDE: 107 mmol/L (ref 98–111)
CO2: 26 mmol/L (ref 22–32)
CREATININE: 0.95 mg/dL (ref 0.44–1.00)
Calcium: 8.3 mg/dL — ABNORMAL LOW (ref 8.9–10.3)
GFR calc Af Amer: >60 mL/min (ref >60)
GFR calc non Af Amer: >60 mL/min (ref >60)
Glucose, Bld: 307 mg/dL — ABNORMAL HIGH (ref 70–99)
Potassium: 3.3 mmol/L — ABNORMAL LOW (ref 3.5–5.1)
Sodium: 142 mmol/L (ref 135–145)
Total Bilirubin: 0.4 mg/dL (ref 0.3–1.2)
Total Protein: 6.1 g/dL — ABNORMAL LOW (ref 6.5–8.1)

## 2018-07-22 LAB — CBC
HCT: 34.8 % — ABNORMAL LOW (ref 36.0–46.0)
Hemoglobin: 10.1 g/dL — ABNORMAL LOW (ref 12.0–15.0)
MCH: 26.7 pg (ref 26.0–34.0)
MCHC: 29 g/dL — ABNORMAL LOW (ref 30.0–36.0)
MCV: 92.1 fL (ref 80.0–100.0)
PLATELETS: 267 10*3/uL (ref 150–400)
RBC: 3.78 MIL/uL — AB (ref 3.87–5.11)
RDW: 16 % — AB (ref 11.5–15.5)
WBC: 7.4 10*3/uL (ref 4.0–10.5)
nRBC: 0.3 % — ABNORMAL HIGH (ref 0.0–0.2)

## 2018-07-22 LAB — PROCALCITONIN: Procalcitonin: <0.1 ng/mL

## 2018-07-22 LAB — TROPONIN I: Troponin I: <0.03 ng/mL (ref <0.03)

## 2018-07-22 LAB — APTT: aPTT: 25 seconds (ref 24–36)

## 2018-07-22 LAB — MAGNESIUM: Magnesium: 2.1 mg/dL (ref 1.7–2.4)

## 2018-07-22 MED ORDER — ENOXAPARIN SODIUM 60 MG/0.6ML ~~LOC~~ SOLN: 50.0000 mg | SUBCUTANEOUS | Status: DC

## 2018-07-22 MED ORDER — HEPARIN (PORCINE) 25000 UT/250ML-% IV SOLN
1250.0000 [IU]/h | INTRAVENOUS | Status: DC
  Administered 2018-07-23: 1250 [IU]/h via INTRAVENOUS
  Filled 2018-07-22: qty 250

## 2018-07-22 MED ORDER — PREDNISONE 20 MG PO TABS
100.0000 mg | ORAL_TABLET | Freq: Two times a day (BID) | ORAL | Status: DC
  Administered 2018-07-22 – 2018-07-25 (×6): 100 mg via ORAL
  Filled 2018-07-22 (×6): qty 5

## 2018-07-22 MED ORDER — DOCUSATE SODIUM 100 MG PO CAPS
200.0000 mg | ORAL_CAPSULE | Freq: Two times a day (BID) | ORAL | Status: DC
  Administered 2018-07-22 – 2018-07-23 (×3): 200 mg via ORAL
  Administered 2018-07-24: 100 mg via ORAL
  Administered 2018-07-24 – 2018-07-25 (×2): 200 mg via ORAL
  Filled 2018-07-22 (×7): qty 2

## 2018-07-22 MED ORDER — CELECOXIB 100 MG PO CAPS
400.0000 mg | ORAL_CAPSULE | Freq: Every day | ORAL | Status: DC
  Administered 2018-07-22 – 2018-07-23 (×2): 400 mg via ORAL
  Filled 2018-07-22 (×2): qty 4

## 2018-07-22 MED ORDER — VANCOMYCIN HCL IN DEXTROSE 750-5 MG/150ML-% IV SOLN
750.0000 mg | Freq: Two times a day (BID) | INTRAVENOUS | Status: DC
  Administered 2018-07-23: 750 mg via INTRAVENOUS
  Filled 2018-07-22 (×3): qty 150

## 2018-07-22 MED ORDER — TIZANIDINE HCL 4 MG PO TABS
4.0000 mg | ORAL_TABLET | Freq: Three times a day (TID) | ORAL | Status: DC
  Administered 2018-07-22 – 2018-07-25 (×8): 4 mg via ORAL
  Filled 2018-07-22 (×8): qty 1

## 2018-07-22 MED ORDER — LACTULOSE 10 GM/15ML PO SOLN
10.0000 g | Freq: Every day | ORAL | Status: DC
  Administered 2018-07-22: 10 g via ORAL
  Filled 2018-07-22 (×2): qty 30

## 2018-07-22 MED ORDER — PROCHLORPERAZINE MALEATE 5 MG PO TABS: 10.0000 mg | ORAL_TABLET | Freq: Four times a day (QID) | ORAL | Status: DC | PRN

## 2018-07-22 MED ORDER — ROSUVASTATIN CALCIUM 20 MG PO TABS
20.0000 mg | ORAL_TABLET | Freq: Every day | ORAL | Status: DC
  Administered 2018-07-22 – 2018-07-24 (×3): 20 mg via ORAL
  Filled 2018-07-22 (×3): qty 1

## 2018-07-22 MED ORDER — PANTOPRAZOLE SODIUM 40 MG PO TBEC
40.0000 mg | DELAYED_RELEASE_TABLET | Freq: Every day | ORAL | Status: DC
  Administered 2018-07-23 – 2018-07-25 (×3): 40 mg via ORAL
  Filled 2018-07-22 (×3): qty 1

## 2018-07-22 MED ORDER — POTASSIUM CHLORIDE CRYS ER 20 MEQ PO TBCR
40.0000 meq | EXTENDED_RELEASE_TABLET | Freq: Once | ORAL | Status: AC
  Administered 2018-07-22: 40 meq via ORAL
  Filled 2018-07-22: qty 2

## 2018-07-22 MED ORDER — IOPAMIDOL (ISOVUE-370) INJECTION 76%
100.0000 mL | Freq: Once | INTRAVENOUS | Status: AC | PRN
  Administered 2018-07-23: 100 mL via INTRAVENOUS

## 2018-07-22 MED ORDER — DM-GUAIFENESIN ER 30-600 MG PO TB12
1.0000 | ORAL_TABLET | Freq: Two times a day (BID) | ORAL | Status: DC
  Administered 2018-07-22 – 2018-07-25 (×6): 1 via ORAL
  Filled 2018-07-22 (×6): qty 1

## 2018-07-22 MED ORDER — DILTIAZEM HCL ER COATED BEADS 120 MG PO CP24: 120.0000 mg | ORAL_CAPSULE | Freq: Every day | ORAL | Status: DC

## 2018-07-22 MED ORDER — HYDROCODONE-ACETAMINOPHEN 5-325 MG PO TABS: 1.0000 | ORAL_TABLET | Freq: Three times a day (TID) | ORAL | Status: DC | PRN

## 2018-07-22 MED ORDER — ASPIRIN EC 81 MG PO TBEC
81.0000 mg | DELAYED_RELEASE_TABLET | Freq: Every day | ORAL | Status: DC
  Administered 2018-07-23 – 2018-07-25 (×3): 81 mg via ORAL
  Filled 2018-07-22 (×3): qty 1

## 2018-07-22 MED ORDER — FLUOXETINE HCL 20 MG PO CAPS
20.0000 mg | ORAL_CAPSULE | Freq: Every day | ORAL | Status: DC
  Administered 2018-07-23 – 2018-07-25 (×3): 20 mg via ORAL
  Filled 2018-07-22 (×3): qty 1

## 2018-07-22 MED ORDER — IPRATROPIUM-ALBUTEROL 0.5-2.5 (3) MG/3ML IN SOLN
3.0000 mL | Freq: Four times a day (QID) | RESPIRATORY_TRACT | Status: DC | PRN
  Administered 2018-07-23 – 2018-07-25 (×6): 3 mL via RESPIRATORY_TRACT
  Filled 2018-07-22 (×6): qty 3

## 2018-07-22 MED ORDER — MOMETASONE FURO-FORMOTEROL FUM 100-5 MCG/ACT IN AERO
2.0000 | INHALATION_SPRAY | Freq: Two times a day (BID) | RESPIRATORY_TRACT | Status: DC
  Administered 2018-07-23 – 2018-07-25 (×5): 2 via RESPIRATORY_TRACT
  Filled 2018-07-22: qty 8.8

## 2018-07-22 MED ORDER — SODIUM CHLORIDE 0.9 % IV SOLN
2.0000 g | Freq: Three times a day (TID) | INTRAVENOUS | Status: DC
  Administered 2018-07-23 – 2018-07-24 (×5): 2 g via INTRAVENOUS
  Filled 2018-07-22 (×10): qty 2

## 2018-07-22 MED ORDER — ALPRAZOLAM 1 MG PO TABS
1.0000 mg | ORAL_TABLET | Freq: Two times a day (BID) | ORAL | Status: DC
  Administered 2018-07-22 – 2018-07-23 (×3): 1 mg via ORAL
  Filled 2018-07-22 (×3): qty 1

## 2018-07-22 MED ORDER — VITAMIN D 25 MCG (1000 UNIT) PO TABS
2000.0000 [IU] | ORAL_TABLET | Freq: Every day | ORAL | Status: DC
  Administered 2018-07-22 – 2018-07-25 (×4): 2000 [IU] via ORAL
  Filled 2018-07-22 (×4): qty 2

## 2018-07-22 MED ORDER — VANCOMYCIN HCL 10 G IV SOLR
2000.0000 mg | Freq: Once | INTRAVENOUS | Status: AC
  Administered 2018-07-22: 2000 mg via INTRAVENOUS
  Filled 2018-07-22: qty 2000

## 2018-07-22 MED ORDER — HEPARIN BOLUS VIA INFUSION
4500.0000 [IU] | Freq: Once | INTRAVENOUS | Status: AC
  Administered 2018-07-22: 4500 [IU] via INTRAVENOUS
  Filled 2018-07-22: qty 4500

## 2018-07-22 MED ORDER — DILTIAZEM HCL ER COATED BEADS 120 MG PO CP24
120.0000 mg | ORAL_CAPSULE | Freq: Every day | ORAL | Status: DC
  Administered 2018-07-23 – 2018-07-25 (×3): 120 mg via ORAL
  Filled 2018-07-22 (×4): qty 1

## 2018-07-22 NOTE — Progress Notes (Signed)
Port unable to be accessed, attempts with no success. Per pt it's flipped and it's only particular nurses on the 4th floor who have been able to access it. IV site to hand can not be used for CT and site higher up was unable to be obtained. Notified Dr. Marlowe Sax of this, ok for CT to be done in the morning.

## 2018-07-22 NOTE — Assessment & Plan Note (Addendum)
1.  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  She was evaluated by Dr. Roxan Hockey on 01/08/2018 and felt not to be a surgical candidate. - Started on chemoradiation therapy with weekly carboplatin and paclitaxel on 02/11/2018. - She had trouble with swallowing and retrosternal pain over the weekend.  [facility] mouthwash did not help.  Dr.Yanagihara gave her lidocaine 15 mL every 4 hours which is helping.  She is able to eat well now. - She completed chemotherapy on 03/19/2018 and radiation therapy on 03/25/2018.  She has recovered well from side effects of radiation and chemo. - I have discussed the results of the CT scan of the chest dated 04/30/2018 which showed central left upper lobe lung mass has decreased in size to 2.1 x 1.6 cm, from 3.4 x 3 cm.  There is also decreased extrinsic narrowing of the segmental left upper lobe bronchi.  Clustered satellite nodularity in the posterior left upper lobe has decreased.  No new areas were seen. -As she had partial response, durvalumab consolidation every 2 weeks for 12 months was recommended. - Durvalumab consolidation started on 05/07/2018, last dose on 05/21/2018.   - She was diagnosed with pneumonitis of the left lung on 07/17/2018 by x-ray and CT of the chest when she presented with worsening shortness of breath.  She was started on prednisone 1 mg/kg, rounded off to100 mg daily on 07/18/2018. -She reported that  her shortness of breath on exertion was stable until this morning when it became suddenly worse when she was getting ready to come to her office.  She has been taking prednisone without fail. -I have done another x-ray today in office.  This showed slightly more prominent findings in the left lung.  Infection in that area is also possible.  It is likely that she is not showing all the signs of infection/inflammation secondary to steroids.  I think she requires hospitalization and close monitoring with antibiotics.  If it does not improve, we will consider infliximab to reverse immunotherapy induced pneumonitis. -I will call the hospitalist service to admit this patient.  I talked to the patient and her son.  They are agreeable with the plan.  2.  Family history: The 2 of her sisters died of stomach cancer.  Father died of lung cancer.  Maternal aunt died of thyroid cancer.  Maternal grandfather had cancer in the bone.  Primary is unknown.  We will refer her to our geneticist.  3.  Iron deficiency state: - She will continue iron tablet with stool softeners.  Last ferritin was 14.

## 2018-07-22 NOTE — Progress Notes (Signed)
Report called to [place], RN on unit 300.  Pt transported via wheelchair to room 339.

## 2018-07-22 NOTE — Progress Notes (Signed)
Pharmacy Antibiotic Note  [name] is a 72 y.o. female admitted on 07/22/2018 with pneumonia.  Pharmacy has been consulted for Vancomycin and Cefepime dosing.  Plan: Vancomycin 2000mg  loading dose, then 750mg  IV every 12 hours.  Goal trough 15-20 mcg/mL.Cefepime 2gm IV q8h F/U cxs and clinical progresss Monitor V/S, labs, and levels as indicated  Height: 5\' 4"  (162.6 cm) Weight: 234 lb 9.6 oz (106.4 kg) IBW/kg (Calculated) : 54.7  Temp (24hrs), Avg:97.7 F (36.5 C), Min:97.6 F (36.4 C), Max:97.7 F (36.5 C)  Recent Labs  Lab 07/17/18 1250  WBC 4.1  CREATININE 0.77    Estimated Creatinine Clearance: 77.9 mL/min (by C-G formula based on SCr of 0.77 mg/dL).   Normalized CrCl is 77 mls/min  Allergies  Allergen Reactions  . Bee Venom Anaphylaxis  . Codeine   . Penicillins Other (See Comments)    Blisters in mouth after dental work Has patient had a PCN reaction causing immediate rash, facial/tongue/throat swelling, SOB or lightheadedness with hypotension: Yes Has patient had a PCN reaction causing severe rash involving mucus membranes or skin necrosis: No Has patient had a PCN reaction that required hospitalization No Has patient had a PCN reaction occurring within the last 10 years: Yes If all of the above answers are "NO", then may proceed with Cephalosporin use.     Antimicrobials this admission: Vancomycin 12/10>>  Cefepime 12/10 >>   Dose adjustments this admission: N/A  Microbiology results:    BCx:    MRSA PCR:   Thank you for allowing pharmacy to be a part of this patient's care.  Isac Sarna, BS [place], [place] Clinical Pharmacist Pager [phone] 07/22/2018 4:26 PM

## 2018-07-22 NOTE — H&P (Addendum)
History and Physical    [name] EGB:[id number] DOB: 03/03/1946 DOA: 07/22/2018  PCP: Fayrene Helper, MD Patient coming from: Cancer center  Chief Complaint: Shortness of breath  HPI: [place] is a 72 y.o. 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 Patient recently presented to her oncologist with worsening shortness of breath.  She was diagnosed with pneumonitis of the left lung by x-ray and CT with contrast done on July 17, 2018.  She was started on prednisone 1 mg/kg, rounded off to 100 mg daily on July 18, 2018.  Patient's shortness of breath was stable until this morning when it became suddenly worse when she was getting ready to come to her office.  She has been compliant with prednisone.  Repeat x-ray done in the office showing slightly more prominent findings in the left lung.  Infection in that area is also possible.  It is thought that patient might not be showing all of the signs of infection/inflammation secondary to steroids.  Oncology recommended hospitalization and close monitoring with antibiotics.  If symptoms do not improve, they will consider infliximab to reverse immunotherapy induced pneumonitis.  Patient states she has been feeling short of breath for the past 5 days.  Also reports having a dry cough and wheezing. Denies having any fatigue.  States symptoms persisted despite taking prednisone.  States she felt so short of breath this morning that she had to use her husband's home oxygen.  Her shortness of breath is at rest.  Reports feeling cold, not sure if she is having fevers.  Also reports having an episode of chest pain this morning when she was having shortness of breath.  The chest pain was substernal and subsided in 30 minutes.  Denies prior history of MI or coronary artery  disease.  Denies prior history of blood clots.  Review of Systems: As per HPI otherwise 10 point review of systems negative.  Past Medical History:  Diagnosis Date  . Anxiety   . Arthritis   . Bipolar disorder ([place])   . Chronic back pain   . COPD (chronic obstructive pulmonary disease) ([place])   . Family history of lung cancer   . Family history of prostate cancer   . Family history of stomach cancer   . 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 GERD (gastroesophageal reflux disease) 2000  . Hyperlipidemia 1995  . Hypertension 1995  . Nicotine addiction   . Obesity     Past Surgical History:  Procedure Laterality Date  . BACK SURGERY  1999  . BRONCHIAL BRUSHINGS Left 10/30/2017   Procedure: BRONCHIAL BRUSHINGS;  Surgeon: [name], MD;  [facility];  Service: Cardiopulmonary;  Laterality: Left;  . BRONCHIAL WASHINGS Left 10/30/2017   Procedure: BRONCHIAL WASHINGS;  Surgeon: [name], MD;  Location: [facility];  Service: Cardiopulmonary;  Laterality: Left;  . CHOLECYSTECTOMY  2001  . COLONOSCOPY N/A 02/02/2016   Procedure: COLONOSCOPY;  Surgeon: [name], MD;  Location: [facility];  Service: Endoscopy;  Laterality: N/A;  930  . FLEXIBLE BRONCHOSCOPY Bilateral 10/30/2017   Procedure: FLEXIBLE BRONCHOSCOPY;  Surgeon: [name], MD;  Location: [facility];  Service: Cardiopulmonary;  Laterality: Bilateral;  . INCISIONAL HERNIA REPAIR  August 17, 2008  . left inguinal hernia herniorrhapy  [place] Left 02/03/2018   Procedure: INSERTION PORT-A-CATH;  Surgeon: [name], MD;  Location: [facility];  Service: General;  Laterality: Left;  . removal of thmoma  03/27/2010   Dr. Arlyce Dice   . SPINE SURGERY    . TONSILLECTOMY    . TOTAL ABDOMINAL HYSTERECTOMY W/ BILATERAL SALPINGOOPHORECTOMY  1984     reports that she quit smoking about 11 months ago. Her smoking use included cigarettes. She has a  81.00 pack-year smoking history. She has never used smokeless tobacco. She reports that she does not drink alcohol or use drugs.  Allergies  Allergen Reactions  . Bee Venom Anaphylaxis  . Penicillins Itching    Blisters in mouth after dental work Has patient had a PCN reaction causing immediate rash, facial/tongue/throat swelling, SOB or lightheadedness with hypotension: no Has patient had a PCN reaction causing severe rash involving mucus membranes or skin necrosis: No Has patient had a PCN reaction that required hospitalization No Has patient had a PCN reaction occurring within the last 10 years: Yes If all of the above answers are "NO", then may proceed with Cephalosporin use.   . Codeine     Family History  Problem Relation Age of Onset  . Heart disease Mother        enlarged  . Diabetes Mother   . Hypertension Mother   . Cancer Father        throat cancer - smoker  . Hypertension Father   . Coronary artery disease Father   . Diabetes Sister        x3  . Asthma Sister   . Lung cancer Sister 64       d. 62; smoker  . Kidney disease Brother   . Stomach cancer Sister        dx in her 23s-50s  . Stroke Brother   . Prostate cancer Brother 55       pat 1/2 brother  . Thyroid cancer Maternal Aunt        dx and d. in her 45s  . Bone cancer Maternal Uncle   . Bone cancer Maternal Grandfather   . Cancer Cousin        NOS - had a swollen stomach    Prior to Admission medications   Medication Sig Start Date End Date Taking? Authorizing Provider  albuterol (PROAIR HFA) 108 (90 Base) MCG/ACT inhaler INHALE 2 PUFFS BY MOUTH EVERY 6 HOURS IF NEEDED 03/18/18   Fayrene Helper, MD  ALPRAZolam Duanne Moron) 1 MG tablet Take 1 tablet (1 mg total) by mouth 2 (two) times daily. 05/15/18 09/12/18  Fayrene Helper, MD  aspirin (ASPIRIN LOW DOSE) 81 MG EC tablet Take 81 mg by mouth daily.      [provider]  celecoxib (CELEBREX) 400 MG capsule TAKE ONE CAPSULE BY MOUTH ONCE  DAILY 05/06/18   Fayrene Helper, MD  Cholecalciferol (VITAMIN D3) 50 MCG (2000 UT) capsule Take 2,000 Units by mouth daily.    [provider]  diltiazem (CARDIZEM CD) 120 MG 24 hr capsule TAKE 1 TABLET ONCE DAILY 12/23/17   Fayrene Helper, MD  docusate sodium (COLACE) 100 MG capsule Take 200 mg by mouth 2 (two) times daily.    [provider]  Durvalumab (IMFINZI IV) Inject into the vein.    [provider]  EPINEPHrine 0.3 mg/0.3 mL IJ SOAJ injection Inject 0.3 mg into the muscle once.    [provider]  FLUoxetine (  PROZAC) 20 MG capsule TAKE 1 TABLET ONCE DAILY 06/10/18   Fayrene Helper, MD  furosemide (LASIX) 20 MG tablet Take 1 tablet (20 mg total) by mouth 2 (two) times daily. 05/21/17   Fayrene Helper, MD  guaiFENesin (MUCINEX) 600 MG 12 hr tablet Take 1 tablet (600 mg total) by mouth 2 (two) times daily. 11/01/17   Kathie Dike, MD  HYDROcodone-acetaminophen (NORCO/VICODIN) 5-325 MG tablet Take one tablet three times daily for back pain 07/08/18 08/07/18  Fayrene Helper, MD  lactulose [facility]) 10 GM/15ML solution Take 15-11m at bedtime every night to assist with moving bowels.  Titrate down if having multiple bowel movements. 03/05/18   KDerek Jack MD  lidocaine (XYLOCAINE) 2 % solution  03/20/18   [provider]  lidocaine-prilocaine (EMLA) cream Apply a quarter size amount to port site 1 hour prior to chemo. Do not rub in. Cover with plastic wrap. 01/22/18   KDerek Jack MD  lisinopril (PRINIVIL,ZESTRIL) 10 MG tablet Take 1 tablet (10 mg total) by mouth daily. 08/14/17   SFayrene Helper MD  omeprazole (PRILOSEC) 40 MG capsule Take 1 capsule (40 mg total) by mouth daily. 06/10/18   SFayrene Helper MD  potassium chloride SA (K-DUR,KLOR-CON) 20 MEQ tablet Take 1 tablet (20 mEq total) by mouth daily. 06/10/18   SFayrene Helper MD  predniSONE (DELTASONE) 20 MG tablet Take 5 tablets (100 mg  total) by mouth daily with breakfast. 07/18/18   Lockamy, Randi L, NP-C  prochlorperazine (COMPAZINE) 10 MG tablet Take 10 mg by mouth every 6 (six) hours as needed for nausea or vomiting.    [provider]  rosuvastatin (CRESTOR) 20 MG tablet TAKE 1 TABLET(20 MG) BY MOUTH DAILY 05/06/18   SFayrene Helper MD  SYMBICORT 80-4.5 MCG/ACT inhaler INHALE 2 PUFFS BY MOUTH TWICE DAILY( RINSE MOUTH AFTER USE) 05/06/18   SFayrene Helper MD  tiZANidine (ZANAFLEX) 4 MG tablet TAKE 1 TABLET(4 MG) BY MOUTH THREE TIMES DAILY 04/30/18   SFayrene Helper MD    Physical Exam: Vitals:   07/22/18 1618  BP: (!) 154/87  Pulse: (!) 101  Resp: 20  Temp: 97.7 F (36.5 C)  TempSrc: Oral  Weight: 106.4 kg  Height: _0  (1.626 m)    Physical Exam  Constitutional: She is oriented to person, place, and time. She appears well-developed and well-nourished. No distress.  HENT:  Head: Normocephalic.  Mouth/Throat: Oropharynx is clear and moist.  Eyes: Right eye exhibits no discharge. Left eye exhibits no discharge.  Neck: Neck supple.  Cardiovascular: Normal rate, regular rhythm and intact distal pulses.  Pulmonary/Chest: Breath sounds normal. She has no wheezes. She has no rales.  Speaking clearly in full sentences No increased work of breathing On 2.5 L supplemental oxygen  Abdominal: Soft. Bowel sounds are normal. She exhibits no distension. There is no tenderness. There is no guarding.  Musculoskeletal: She exhibits no edema.  Neurological: She is alert and oriented to person, place, and time.  Skin: Skin is warm and dry. She is not diaphoretic.  Psychiatric: She has a normal mood and affect. Her behavior is normal.     Labs on Admission: I have personally reviewed following labs and imaging studies  CBC: Recent Labs  Lab 07/17/18 1250  WBC 4.1  NEUTROABS 2.8  HGB 10.5*  HCT 35.5*  MCV 91.3  PLT 2497  Basic Metabolic Panel: Recent Labs  Lab 07/17/18 1250  NA 141  K  3.3*  CL 109  CO2 24  GLUCOSE 130*  BUN 14  CREATININE 0.77  CALCIUM 8.7*   GFR: Estimated Creatinine Clearance: 77.9 mL/min (by C-G formula based on SCr of 0.77 mg/dL). Liver Function Tests: Recent Labs  Lab 07/17/18 1250  AST 19  ALT 12  ALKPHOS 61  BILITOT 0.3  PROT 6.5  ALBUMIN 3.5   No results for input(s): LIPASE, AMYLASE in the last 168 hours. No results for input(s): AMMONIA in the last 168 hours. Coagulation Profile: No results for input(s): INR, PROTIME in the last 168 hours. Cardiac Enzymes: No results for input(s): CKTOTAL, CKMB, CKMBINDEX, TROPONINI in the last 168 hours. BNP (last 3 results) No results for input(s): PROBNP in the last 8760 hours. HbA1C: No results for input(s): HGBA1C in the last 72 hours. CBG: No results for input(s): GLUCAP in the last 168 hours. Lipid Profile: No results for input(s): CHOL, HDL, LDLCALC, TRIG, CHOLHDL, LDLDIRECT in the last 72 hours. Thyroid Function Tests: No results for input(s): TSH, T4TOTAL, FREET4, T3FREE, THYROIDAB in the last 72 hours. Anemia Panel: No results for input(s): VITAMINB12, FOLATE, FERRITIN, TIBC, IRON, RETICCTPCT in the last 72 hours. Urine analysis:    Component Value Date/Time   COLORURINE YELLOW 03/23/2010 1030   APPEARANCEUR CLOUDY (A) 03/23/2010 1030   LABSPEC 1.030 03/23/2010 1030   PHURINE 5.0 03/23/2010 1030   GLUCOSEU NEGATIVE 03/23/2010 1030   HGBUR NEGATIVE 03/23/2010 1030   BILIRUBINUR negative 05/31/2014 1118   KETONESUR 15 (A) 03/23/2010 1030   PROTEINUR negative 05/31/2014 1118   PROTEINUR NEGATIVE 03/23/2010 1030   UROBILINOGEN 0.2 05/31/2014 1118   UROBILINOGEN 1.0 03/23/2010 1030   NITRITE negative 05/31/2014 1118   NITRITE NEGATIVE 03/23/2010 1030   LEUKOCYTESUR Trace 05/31/2014 1118    Radiological Exams on Admission: Dg Chest 2 View  Result Date: 07/22/2018 CLINICAL DATA:  Increased shortness of breath.  Left pneumonitis. EXAM: CHEST - 2 VIEW COMPARISON:  CT  07/17/2018. FINDINGS: PowerPort catheter with tip over superior vena cava. Prior median sternotomy. Heart size stable. Left upper lobe ill-defined density and adjacent infiltrate. Small left pleural effusion. Similar findings noted on prior CT of 07/17/2018. This consistent with lung cancer and changes related to treatment. IMPRESSION: 1.  PowerPort noted in stable position. 2. Left upper lung mass and adjacent interstitial changes consistent with known lung cancer and post treatment changes. Small left pleural effusion. Similar findings noted on prior exam. Electronically Signed   By: [place]   On: 07/22/2018 11:57    Assessment/Plan Principal Problem:   SOB (shortness of breath) Active Problems:   HLD (hyperlipidemia)   Anxiety   HTN (hypertension)   GERD (gastroesophageal reflux disease)   Anxiety and depression   Squamous cell lung cancer, left (HCC)   Chest pain   Chronic pain   Chronic constipation   Physical deconditioning   Acute hypoxic respiratory failure secondary to suspected left lung pneumonitis, possible underlying infection -Diagnosed with pneumonitis of the left lung by x-ray and CT with contrast done on July 17, 2018.  She was started on prednisone 1 mg/kg, rounded off to 100 mg daily on July 18, 2018.  Patient's shortness of breath was stable until this morning when it became suddenly worse despite compliance with prednisone.  Repeat x-ray done in the office showing slightly more prominent findings in the left lung.  Infection in that area is also possible.  Patient might not be showing all of the signs of infection/inflammation secondary to steroids.  Oncology recommended  hospitalization and close monitoring with antibiotics.  If symptoms do not improve, they will consider infliximab to reverse immunotherapy induced pneumonitis. -Borderline tachycardic and hypoxic, PE is also on the differential.  Check d-dimer level.  If elevated, patient will need CT angiogram  of chest. -Does not appear grossly volume overloaded on exam and imaging.  No prior echocardiogram in the chart.  Echocardiogram has been ordered. -No wheezing appreciated on exam.  Continue prednisone, Dulera, duo nebs every 6 hours as needed for COPD. -Vancomycin, cefepime for pneumonia coverage -Prednisone 100 mg twice daily per oncology recommendation -Mucinex DM for cough -Check procalcitonin level -Supplemental oxygen if needed -Check CBC, CMP -Monitor vitals  Addendum: D-Dimer elevated at 1.07. STAT CTA chest was ordered but I was informed that the study cannot be done as staff unable to get IV access and unable to access port at this time. Nurses specializing in port access will be available in the morning. Will start empiric IV heparin until CTA is done.   Chest pain Possibly related to dyspnea.  Experienced substernal chest pain at rest when she was having severe shortness of breath this morning.  Chest pain resolved in 30 minutes.  No documented history of CAD.  Appears comfortable on exam and currently not having chest pain. 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  She completed chemotherapy and radiation in August 2019.  Treated with immunotherapy from September 2019 to October 2019. -Oncology following  Chronic pain -Continue home tizanidine, Celebrex  Hyperlipidemia -Continue home Crestor  GERD -Continue PPI  Chronic constipation -Continue Colace, lactulose as needed  Depression, anxiety -Continue home Prozac, Xanax  Hypertension -Continue home Cardizem  Physical deconditioning -PT evaluation  DVT prophylaxis: Lovenox Code Status: Patient wishes to be DNR/DNI. Family Communication: No family available. Disposition Plan: Anticipate discharge to home in 2 to 3 days. Consults called: None Admission status: It is my clinical opinion that admission to INPATIENT is reasonable and  necessary in this 72 y.o. female . presenting with symptoms of dyspnea, wheezing, cough, concerning for pneumonitis, possible pneumonia . in the context of PMH including: Lung cancer . with pertinent positives on physical exam including: Supplemental oxygen requirement . and pertinent positives on radiographic and laboratory data including: Imaging with evidence of pneumonitis. . Workup and treatment include IV antibiotics, steroid.   Given the aforementioned, the predictability of an adverse outcome is felt to be significant. I expect that the patient will require at least 2 midnights in the hospital to treat this condition.    Shela Leff MD [facility] Pager [phone]  If 7PM-7AM, please contact night-coverage [email or web address] Password [facility]  07/22/2018, 5:48 PM

## 2018-07-22 NOTE — Progress Notes (Signed)
ANTICOAGULATION CONSULT NOTE - Initial Consult  Pharmacy Consult for heparin Indication: pulmonary embolus  Allergies  Allergen Reactions  . Bee Venom Anaphylaxis  . Penicillins Itching    Blisters in mouth after dental work Has patient had a PCN reaction causing immediate rash, facial/tongue/throat swelling, SOB or lightheadedness with hypotension: no Has patient had a PCN reaction causing severe rash involving mucus membranes or skin necrosis: No Has patient had a PCN reaction that required hospitalization No Has patient had a PCN reaction occurring within the last 10 years: Yes If all of the above answers are "NO", then may proceed with Cephalosporin use.   . Codeine     Patient Measurements: Height: 5\' 4"  (162.6 cm) Weight: 234 lb 9.6 oz (106.4 kg) IBW/kg (Calculated) : 54.7 HEPARIN DW (KG): 79.8  Vital Signs: Temp: 97.7 F (36.5 C) (12/10 1618) Temp Source: Oral (12/10 1618) BP: 154/87 (12/10 1618) Pulse Rate: 101 (12/10 1618)  Labs: Recent Labs    07/22/18 1737  HGB 10.1*  HCT 34.8*  PLT 267  CREATININE 0.95  TROPONINI <0.03    Estimated Creatinine Clearance: 65.6 mL/min (by C-G formula based on SCr of 0.95 mg/dL).   Medical History: Past Medical History:  Diagnosis Date  . Anxiety   . Arthritis   . Bipolar disorder ([place])   . Chronic back pain   . COPD (chronic obstructive pulmonary disease) ([place])   . Family history of lung cancer   . Family history of prostate cancer   . Family history of stomach cancer   . 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 GERD (gastroesophageal reflux disease) 2000  . Hyperlipidemia 1995  . Hypertension 1995  . Nicotine addiction   . Obesity     Medications:  Medications Prior to Admission  Medication Sig Dispense Refill Last Dose  . albuterol (PROAIR HFA) 108 (90 Base) MCG/ACT inhaler INHALE 2 PUFFS BY MOUTH EVERY 6 HOURS IF NEEDED (Patient taking differently: Inhale 2 puffs into  the lungs every 6 (six) hours as needed for wheezing or shortness of breath. ) 8.5 g 0 Past Week at Unknown time  . ALPRAZolam (XANAX) 1 MG tablet Take 1 tablet (1 mg total) by mouth 2 (two) times daily. 60 tablet 3 07/22/2018 at Unknown time  . aspirin (ASPIRIN LOW DOSE) 81 MG EC tablet Take 81 mg by mouth daily.     07/22/2018 at Unknown time  . celecoxib (CELEBREX) 400 MG capsule TAKE ONE CAPSULE BY MOUTH ONCE DAILY (Patient taking differently: Take 400 mg by mouth daily. ) 30 capsule 6 07/21/2018 at Unknown time  . Cholecalciferol (VITAMIN D3) 50 MCG (2000 UT) capsule Take 2,000 Units by mouth daily.   07/21/2018 at Unknown time  . diltiazem (CARDIZEM CD) 120 MG 24 hr capsule TAKE 1 TABLET ONCE DAILY (Patient taking differently: Take 120 mg by mouth every morning. ) 90 capsule 1 07/22/2018 at Unknown time  . diphenhydrAMINE (BENADRYL) 25 mg capsule Take 25-50 mg by mouth at bedtime as needed for sleep.   07/21/2018 at Unknown time  . docusate sodium (COLACE) 100 MG capsule Take 100-200 mg by mouth 2 (two) times daily as needed for mild constipation or moderate constipation.    unknown  . Durvalumab (IMFINZI IV) Inject into the vein every 14 (fourteen) days.    Past Month at Unknown time  . EPINEPHrine 0.3 mg/0.3 mL IJ SOAJ injection Inject 0.3 mg into the muscle once.  unknown  . FLUoxetine (PROZAC) 20 MG capsule TAKE 1 TABLET ONCE DAILY (Patient taking differently: Take 20 mg by mouth every morning. ) 90 capsule 1 07/22/2018 at Unknown time  . furosemide (LASIX) 20 MG tablet Take 1 tablet (20 mg total) by mouth 2 (two) times daily. (Patient taking differently: Take 40 mg by mouth every morning. ) 180 tablet 1 07/22/2018 at Unknown time  . guaiFENesin (MUCINEX) 600 MG 12 hr tablet Take 1 tablet (600 mg total) by mouth 2 (two) times daily. 30 tablet 0 07/22/2018 at Unknown time  . HYDROcodone-acetaminophen (NORCO/VICODIN) 5-325 MG tablet Take one tablet three times daily for back pain (Patient taking  differently: Take 1 tablet by mouth 3 (three) times daily. For back pain) 90 tablet 0 07/22/2018 at morning  . lactulose (CHRONULAC) 10 GM/15ML solution Take 15-55ml at bedtime every night to assist with moving bowels.  Titrate down if having multiple bowel movements. 240 mL 3 unknown  . lidocaine (XYLOCAINE) 2 % solution Use as directed 15 mLs in the mouth or throat every 6 (six) hours as needed for mouth pain.    unknown  . lidocaine-prilocaine (EMLA) cream Apply a quarter size amount to port site 1 hour prior to chemo. Do not rub in. Cover with plastic wrap. 30 g 3 Past Week at Unknown time  . lisinopril (PRINIVIL,ZESTRIL) 10 MG tablet Take 1 tablet (10 mg total) by mouth daily. (Patient taking differently: Take 10 mg by mouth every morning. ) 90 tablet 3 07/22/2018 at Unknown time  . omeprazole (PRILOSEC) 40 MG capsule Take 1 capsule (40 mg total) by mouth daily. (Patient taking differently: Take 40 mg by mouth every morning. ) 90 capsule 1 07/22/2018 at Unknown time  . potassium chloride SA (K-DUR,KLOR-CON) 20 MEQ tablet Take 1 tablet (20 mEq total) by mouth daily. (Patient taking differently: Take 20 mEq by mouth every morning. ) 90 tablet 1 07/22/2018 at Unknown time  . predniSONE (DELTASONE) 20 MG tablet Take 5 tablets (100 mg total) by mouth daily with breakfast. 80 tablet 0 07/22/2018 at Unknown time  . prochlorperazine (COMPAZINE) 10 MG tablet Take 10 mg by mouth every 6 (six) hours as needed for nausea or vomiting.   unknown  . rosuvastatin (CRESTOR) 20 MG tablet TAKE 1 TABLET(20 MG) BY MOUTH DAILY (Patient taking differently: Take 20 mg by mouth at bedtime. ) 30 tablet 6 07/21/2018 at Unknown time  . SYMBICORT 80-4.5 MCG/ACT inhaler INHALE 2 PUFFS BY MOUTH TWICE DAILY( RINSE MOUTH AFTER USE) 10.2 g 2 07/21/2018 at Unknown time  . tiZANidine (ZANAFLEX) 4 MG tablet TAKE 1 TABLET(4 MG) BY MOUTH THREE TIMES DAILY (Patient taking differently: Take 4 mg by mouth 3 (three) times daily. ) 270 tablet 0  07/22/2018 at Unknown time    Assessment: Pt admitted for acute hypoxic respiratory failure secondary to suspected left lung pneumonitis, possible underlying infection.. CT angiogram is pending but D-Dimer is elevated. Pharmacy asked to start heparin.   Goal of Therapy:  Heparin level 0.3-0.7 units/ml Monitor platelets by anticoagulation protocol: Yes   Plan:  Give 4500 units bolus x 1 Start heparin infusion at 1250 units/hr Check anti-Xa level in 6-8 hours and daily while on heparin Continue to monitor H&H and platelets  Isac Sarna, BS [place]ena [place], BCPS Clinical Pharmacist Pager [phone]  07/22/2018,8:46 PM

## 2018-07-22 NOTE — Patient Instructions (Signed)
[facility] Discharge Instructions     Thank you for choosing [facility] at [facility] to provide your oncology and hematology care.  To afford each patient quality time with our provider, please arrive at least 15 minutes before your scheduled appointment time.   If you have a lab appointment with the Cancer Center please come in thru the  Main Entrance and check in at the main information desk  You need to re-schedule your appointment should you arrive 10 or more minutes late.  We strive to give you quality time with our providers, and arriving late affects you and other patients whose appointments are after yours.  Also, if you no show three or more times for appointments you may be dismissed from the clinic at the providers discretion.     Again, thank you for choosing [facility].  Our hope is that these requests will decrease the amount of time that you wait before being seen by our physicians.       _____________________________________________________________  Should you have questions after your visit to [facility], please contact our office at [phone] between the hours of 8:00 a.m. and 4:30 p.m.  Voicemails left after 4:00 p.m. will not be returned until the following business day.  For prescription refill requests, have your pharmacy contact our office and allow 72 hours.    Cancer Center Support Programs:   > Cancer Support Group  2nd Tuesday of the month 1pm-2pm, Journey Room    

## 2018-07-22 NOTE — Progress Notes (Signed)
[place], [place] [address]   CLINIC:  Medical Oncology/Hematology  PCP:  Melanie Helper, MD [address] [place] [address] [phone]   REASON FOR VISIT: Follow-up for squamous cell lung cancer and newly diagnosis of pneumonitis   CURRENT THERAPY: Imfinzi on hold  BRIEF ONCOLOGIC HISTORY:    Squamous cell lung cancer, left ([facility])   10/28/2017 Imaging    CT scan of the chest shows left hilar mass measuring 3.4 x 2.9 cm, multiple soft tissue masses in the left upper lobe, largest measuring 1.6 cm  MRI of the brain on 10/31/2017 shows dural based focus of contrast enhancement along the superior left convexity, meningioma favored as it was present on MRI in 2009    10/30/2017 Initial Biopsy    Bronchoscopy and biopsy consistent with squamous cell cancer of the lung    11/08/2017 Family History    2 sisters died of stomach cancer Father died of lung cancer in a smoker Maternal aunt died of thyroid cancer Maternal grandfather had cancer spread to the bone    02/05/2018 - 03/25/2018 Chemotherapy    The patient had palonosetron (ALOXI) injection 0.25 mg, 0.25 mg, Intravenous,  Once, 6 of 6 cycles Administration: 0.25 mg (02/11/2018), 0.25 mg (02/18/2018), 0.25 mg (02/25/2018), 0.25 mg (03/04/2018), 0.25 mg (03/12/2018), 0.25 mg (03/19/2018) CARBOplatin (PARAPLATIN) 220 mg in sodium chloride 0.9 % 250 mL chemo infusion, 220 mg (100 % of original dose 215.8 mg), Intravenous,  Once, 6 of 6 cycles Dose modification:   (original dose 215.8 mg, Cycle 1),   (original dose 215.8 mg, Cycle 2), 215.8 mg (original dose 215.8 mg, Cycle 3),   (original dose 215.8 mg, Cycle 4) Administration: 220 mg (02/11/2018), 220 mg (02/18/2018), 220 mg (02/25/2018), 220 mg (03/04/2018), 220 mg (03/12/2018), 220 mg (03/19/2018) PACLitaxel (TAXOL) 96 mg in sodium chloride 0.9 % 250 mL chemo infusion (</= 63m/m2), 45 mg/m2 = 96 mg, Intravenous,  Once, 6 of 6  cycles Administration: 96 mg (02/11/2018), 96 mg (02/18/2018), 96 mg (02/25/2018), 96 mg (03/04/2018), 96 mg (03/12/2018), 96 mg (03/19/2018)  for chemotherapy treatment.      Malignant neoplasm of upper lobe of left lung ([facility]   01/22/2018 Initial Diagnosis    Malignant neoplasm of upper lobe of left lung ([facility]    05/06/2018 -  Chemotherapy    The patient had durvalumab (IMFINZI) 1,000 mg in sodium chloride 0.9 % 100 mL chemo infusion, 10.2 mg/kg = 980 mg, Intravenous,  Once, 4 of 6 cycles Administration: 1,000 mg (05/07/2018), 1,000 mg (05/21/2018), 1,000 mg (06/19/2018), 1,000 mg (07/03/2018)  for chemotherapy treatment.       INTERVAL HISTORY:  Ms. RHaecker89y.o. female returns for routine follow-up squamous cell lung cancer and newly diagnosis of pneumonitis. She reports her SOB has worsened since this morning. We will send her down for a repeat chest xray. She has been using her husband oxygen today. She has a dry nonproductive cough. She denies any hemoptysis. Denies any nausea, vomiting, or diarrhea. Denies any bleeding or easy bruising. She reports her appetite and energy level at 50%.     REVIEW OF SYSTEMS:  Review of Systems  Respiratory: Positive for shortness of breath.   All other systems reviewed and are negative.    PAST MEDICAL/SURGICAL HISTORY:  Past Medical History:  Diagnosis Date  . Anxiety   . Arthritis   . Bipolar disorder ([facility]   . Chronic back pain   .  COPD (chronic obstructive pulmonary disease) ([facility])   . Family history of lung cancer   . Family history of prostate cancer   . Family history of stomach cancer   . 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 GERD (gastroesophageal reflux disease) 2000  . Hyperlipidemia 1995  . Hypertension 1995  . Nicotine addiction   . Obesity    Past Surgical History:  Procedure Laterality Date  . BACK SURGERY  1999  . BRONCHIAL BRUSHINGS Left 10/30/2017   Procedure: BRONCHIAL BRUSHINGS;   Surgeon: [name], MD;  [facility];  Service: Cardiopulmonary;  Laterality: Left;  . BRONCHIAL WASHINGS Left 10/30/2017   Procedure: BRONCHIAL WASHINGS;  Surgeon: [name], MD;  Location: [facility];  Service: Cardiopulmonary;  Laterality: Left;  . CHOLECYSTECTOMY  2001  . COLONOSCOPY N/A 02/02/2016   Procedure: COLONOSCOPY;  Surgeon: [name], MD;  Location: [facility];  Service: Endoscopy;  Laterality: N/A;  930  . FLEXIBLE BRONCHOSCOPY Bilateral 10/30/2017   Procedure: FLEXIBLE BRONCHOSCOPY;  Surgeon: [name], MD;  Location: [facility];  Service: Cardiopulmonary;  Laterality: Bilateral;  . INCISIONAL HERNIA REPAIR  August 17, 2008  . left inguinal hernia herniorrhapy  1980  . PORTACATH PLACEMENT Left 02/03/2018   Procedure: INSERTION PORT-A-CATH;  Surgeon: [name], MD;  Location: [facility];  Service: General;  Laterality: Left;  . removal of thmoma  03/27/2010   Dr. Arlyce Dice   . SPINE SURGERY    . TONSILLECTOMY    . TOTAL ABDOMINAL HYSTERECTOMY W/ BILATERAL SALPINGOOPHORECTOMY  1984     SOCIAL HISTORY:  Social History   Socioeconomic History  . Marital status: Married    Spouse name: Not on file  . Number of children: Not on file  . Years of education: Not on file  . Highest education level: Not on file  Occupational History  . 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 Financial resource strain: Not hard at all  . Food insecurity:    Worry: Never true    Inability: Never true  . Transportation needs:    Medical: No    Non-medical: No  Tobacco Use  . Smoking status: Former Smoker    Packs/day: 1.50    Years: 54.00    Pack years: 81.00    Types: Cigarettes    Last attempt to quit: 08/20/2017    Years since quitting: 0.9  . Smokeless tobacco: Never Used  Substance and Sexual Activity  . Alcohol use: No    Alcohol/week: 0.0 standard drinks  . Drug use: No  . Sexual  activity: Not Currently  Lifestyle  . Physical activity:    Days per week: 0 days    Minutes per session: 0 min  . Stress: Only a little  Relationships  . Social connections:    Talks on phone: More than three times a week    Gets together: More than three times a week    Attends religious service: More than 4 times per year    Active member of club or organization: No    Attends meetings of clubs or organizations: Never    Relationship status: Married  . Intimate partner violence:    Fear of current or ex partner: No    Emotionally abused: No    Physically abused: No    Forced sexual activity: No  Other Topics Concern  .  Not on file  Social History Narrative   Pt had 2 stillborns     FAMILY HISTORY:  Family History  Problem Relation Age of Onset  . Heart disease Mother        enlarged  . Diabetes Mother   . Hypertension Mother   . Cancer Father        throat cancer - smoker  . Hypertension Father   . Coronary artery disease Father   . Diabetes Sister        x3  . Asthma Sister   . Lung cancer Sister 72       d. 34; smoker  . Kidney disease Brother   . Stomach cancer Sister        dx in her 59s-50s  . Stroke Brother   . Prostate cancer Brother 69       pat 1/2 brother  . Thyroid cancer Maternal Aunt        dx and d. in her 68s  . Bone cancer Maternal Uncle   . Bone cancer Maternal Grandfather   . Cancer Cousin        NOS - had a swollen stomach    CURRENT MEDICATIONS:  Outpatient Encounter Medications as of 07/22/2018  Medication Sig  . albuterol (PROAIR HFA) 108 (90 Base) MCG/ACT inhaler INHALE 2 PUFFS BY MOUTH EVERY 6 HOURS IF NEEDED  . ALPRAZolam (XANAX) 1 MG tablet Take 1 tablet (1 mg total) by mouth 2 (two) times daily.  Marland Kitchen aspirin (ASPIRIN LOW DOSE) 81 MG EC tablet Take 81 mg by mouth daily.    . celecoxib (CELEBREX) 400 MG capsule TAKE ONE CAPSULE BY MOUTH ONCE DAILY  . Cholecalciferol (VITAMIN D3) 50 MCG (2000 UT) capsule Take 2,000 Units by mouth  daily.  Marland Kitchen diltiazem (CARDIZEM CD) 120 MG 24 hr capsule TAKE 1 TABLET ONCE DAILY  . docusate sodium (COLACE) 100 MG capsule Take 200 mg by mouth 2 (two) times daily.  Hunt Oris (IMFINZI IV) Inject into the vein.  Marland Kitchen EPINEPHrine 0.3 mg/0.3 mL IJ SOAJ injection Inject 0.3 mg into the muscle once.  Marland Kitchen FLUoxetine (PROZAC) 20 MG capsule TAKE 1 TABLET ONCE DAILY  . furosemide (LASIX) 20 MG tablet Take 1 tablet (20 mg total) by mouth 2 (two) times daily.  Marland Kitchen guaiFENesin (MUCINEX) 600 MG 12 hr tablet Take 1 tablet (600 mg total) by mouth 2 (two) times daily.  Marland Kitchen HYDROcodone-acetaminophen (NORCO/VICODIN) 5-325 MG tablet Take one tablet three times daily for back pain  . lactulose (CHRONULAC) 10 GM/15ML solution Take 15-61m at bedtime every night to assist with moving bowels.  Titrate down if having multiple bowel movements.  . lidocaine (XYLOCAINE) 2 % solution   . lidocaine-prilocaine (EMLA) cream Apply a quarter size amount to port site 1 hour prior to chemo. Do not rub in. Cover with plastic wrap.  . lisinopril (PRINIVIL,ZESTRIL) 10 MG tablet Take 1 tablet (10 mg total) by mouth daily.  .Marland Kitchenomeprazole (PRILOSEC) 40 MG capsule Take 1 capsule (40 mg total) by mouth daily.  . potassium chloride SA (K-DUR,KLOR-CON) 20 MEQ tablet Take 1 tablet (20 mEq total) by mouth daily.  . predniSONE (DELTASONE) 20 MG tablet Take 5 tablets (100 mg total) by mouth daily with breakfast.  . prochlorperazine (COMPAZINE) 10 MG tablet Take 10 mg by mouth every 6 (six) hours as needed for nausea or vomiting.  . rosuvastatin (CRESTOR) 20 MG tablet TAKE 1 TABLET(20 MG) BY MOUTH DAILY  . SYMBICORT 80-4.5 MCG/ACT inhaler  INHALE 2 PUFFS BY MOUTH TWICE DAILY( RINSE MOUTH AFTER USE)  . tiZANidine (ZANAFLEX) 4 MG tablet TAKE 1 TABLET(4 MG) BY MOUTH THREE TIMES DAILY  . [DISCONTINUED] predniSONE (DELTASONE) 20 MG tablet Take 66m PO QD x 3 days, 658mQD x 3days, 4015mD x3 days, 97m46m x 3 days, 10mg6mx 6 days   No  facility-administered encounter medications on file as of 07/22/2018.     ALLERGIES:  Allergies  Allergen Reactions  . Bee Venom Anaphylaxis  . Codeine   . Penicillins Other (See Comments)    Blisters in mouth after dental work Has patient had a PCN reaction causing immediate rash, facial/tongue/throat swelling, SOB or lightheadedness with hypotension: Yes Has patient had a PCN reaction causing severe rash involving mucus membranes or skin necrosis: No Has patient had a PCN reaction that required hospitalization No Has patient had a PCN reaction occurring within the last 10 years: Yes If all of the above answers are "NO", then may proceed with Cephalosporin use.      PHYSICAL EXAM:  ECOG Performance status: 1  Vitals:   07/22/18 1100 07/22/18 1334  BP: (!) 154/69 (!) 160/70  Pulse: 89 87  Resp: 20 20  Temp: 97.6 F (36.4 C) 97.6 F (36.4 C)  SpO2: 96% 100%   Filed Weights   07/22/18 1100  Weight: 238 lb 9.6 oz (108.2 kg)    Physical Exam  Constitutional: She is oriented to person, place, and time. She appears well-developed and well-nourished.  Cardiovascular: Normal rate, regular rhythm and normal heart sounds.  Pulmonary/Chest: Effort normal and breath sounds normal.  Musculoskeletal: Normal range of motion.  Neurological: She is alert and oriented to person, place, and time.  Skin: Skin is warm and dry.  Psychiatric: She has a normal mood and affect. Her behavior is normal. Judgment and thought content normal.     LABORATORY DATA:  I have reviewed the labs as listed.  CBC    Component Value Date/Time   WBC 4.1 07/17/2018 1250   RBC 3.89 07/17/2018 1250   HGB 10.5 (L) 07/17/2018 1250   HCT 35.5 (L) 07/17/2018 1250   PLT 215 07/17/2018 1250   MCV 91.3 07/17/2018 1250   MCH 27.0 07/17/2018 1250   MCHC 29.6 (L) 07/17/2018 1250   RDW 16.1 (H) 07/17/2018 1250   LYMPHSABS 0.7 07/17/2018 1250   MONOABS 0.4 07/17/2018 1250   EOSABS 0.1 07/17/2018 1250    BASOSABS 0.0 07/17/2018 1250   CMP Latest Ref Rng & Units 07/17/2018 07/03/2018 06/19/2018  Glucose 70 - 99 mg/dL 130(H) 147(H) 259(H)  BUN 8 - 23 mg/dL _0 Creatinine 0.44 - 1.00 mg/dL 0.77 0.77 0.75  Sodium 135 - 145 mmol/L 141 137 138  Potassium 3.5 - 5.1 mmol/L 3.3(L) 4.3 4.5  Chloride 98 - 111 mmol/L 109 105 107  CO2 22 - 32 mmol/L _1 Calcium 8.9 - 10.3 mg/dL 8.7(L) 9.2 9.3  Total Protein 6.5 - 8.1 g/dL 6.5 7.2 6.9  Total Bilirubin 0.3 - 1.2 mg/dL 0.3 0.8 0.7  Alkaline Phos 38 - 126 U/L 61 75 85  AST 15 - 41 U/L _2 ALT 0 - 44 U/L _3 DIAGNOSTIC IMAGING:  I have independently reviewed the scans and discussed with the patient.   I have reviewed RandiFrancene Finderss note and agree with the documentation.  I personally performed a face-to-face visit, made revisions  and my assessment and plan is as follows.     ASSESSMENT & PLAN:   Squamous cell lung cancer, left ([facility]) 1.  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  She was evaluated by Dr. Roxan Hockey on 01/08/2018 and felt not to be a surgical candidate. - Started on chemoradiation therapy with weekly carboplatin and paclitaxel on 02/11/2018. - She had trouble with swallowing and retrosternal pain over the weekend.  [facility] mouthwash did not help.  Dr.Yanagihara gave her lidocaine 15 mL every 4 hours which is helping.  She is able to eat well now. -  She completed chemotherapy on 03/19/2018 and radiation therapy on 03/25/2018.  She has recovered well from side effects of radiation and chemo. - I have discussed the results of the CT scan of the chest dated 04/30/2018 which showed central left upper lobe lung mass has decreased in size to 2.1 x 1.6 cm, from 3.4 x 3 cm.  There is also decreased extrinsic narrowing of the segmental left upper lobe bronchi.  Clustered satellite nodularity in the posterior left upper lobe has decreased.  No new areas were seen. -As she had partial response, durvalumab consolidation every 2 weeks for 12 months was recommended. - Durvalumab consolidation started on 05/07/2018, last dose on 05/21/2018.   - She was diagnosed with pneumonitis of the left lung on 07/17/2018 by x-ray and CT of the chest when she presented with worsening shortness of breath.  She was started on prednisone 1 mg/kg, rounded off to100 mg daily on 07/18/2018. -She reported that her shortness of breath on exertion was stable until this morning when it became suddenly worse when she was getting ready to come to her office.  She has been taking prednisone without fail. -I have done another x-ray today in office.  This showed slightly more prominent findings in the left lung.  Infection in that area is also possible.  It is likely that she is not showing all the signs of infection/inflammation secondary to steroids.  I think she requires hospitalization and close monitoring with antibiotics.  If it does not improve, we will consider infliximab to reverse immunotherapy induced pneumonitis. -I will call the hospitalist service to admit this patient.  I talked to the patient and her son.  They are agreeable with the plan.  2.  Family history: The 2 of her sisters died of stomach cancer.  Father died of lung cancer.  Maternal aunt died of thyroid cancer.  Maternal grandfather had cancer in the bone.  Primary is unknown.  We will refer her to our geneticist.  3.  Iron  deficiency state: - She will continue iron tablet with stool softeners.  Last ferritin was 14.       Orders placed this encounter:  Orders Placed This Encounter  Procedures  . DG Chest 2 View      Derek Jack, [place] [phone]

## 2018-07-23 ENCOUNTER — Encounter (HOSPITAL_COMMUNITY): Payer: Self-pay

## 2018-07-23 ENCOUNTER — Inpatient Hospital Stay (HOSPITAL_COMMUNITY): Payer: PPO

## 2018-07-23 ENCOUNTER — Ambulatory Visit: Payer: Self-pay | Admitting: Family Medicine

## 2018-07-23 ENCOUNTER — Other Ambulatory Visit: Payer: Self-pay

## 2018-07-23 DIAGNOSIS — F329 Major depressive disorder, single episode, unspecified: Secondary | ICD-10-CM

## 2018-07-23 DIAGNOSIS — I10 Essential (primary) hypertension: Secondary | ICD-10-CM

## 2018-07-23 DIAGNOSIS — J189 Pneumonia, unspecified organism: Principal | ICD-10-CM

## 2018-07-23 DIAGNOSIS — K219 Gastro-esophageal reflux disease without esophagitis: Secondary | ICD-10-CM

## 2018-07-23 DIAGNOSIS — J9601 Acute respiratory failure with hypoxia: Secondary | ICD-10-CM

## 2018-07-23 DIAGNOSIS — F419 Anxiety disorder, unspecified: Secondary | ICD-10-CM

## 2018-07-23 DIAGNOSIS — R079 Chest pain, unspecified: Secondary | ICD-10-CM

## 2018-07-23 DIAGNOSIS — E785 Hyperlipidemia, unspecified: Secondary | ICD-10-CM

## 2018-07-23 DIAGNOSIS — C3492 Malignant neoplasm of unspecified part of left bronchus or lung: Secondary | ICD-10-CM

## 2018-07-23 LAB — BASIC METABOLIC PANEL
Anion gap: 9 (ref 5–15)
BUN: 19 mg/dL (ref 8–23)
CO2: 28 mmol/L (ref 22–32)
CREATININE: 0.88 mg/dL (ref 0.44–1.00)
Calcium: 8.5 mg/dL — ABNORMAL LOW (ref 8.9–10.3)
Chloride: 103 mmol/L (ref 98–111)
GFR calc non Af Amer: >60 mL/min (ref >60)
GLUCOSE: 306 mg/dL — AB (ref 70–99)
Potassium: 4 mmol/L (ref 3.5–5.1)
Sodium: 140 mmol/L (ref 135–145)

## 2018-07-23 LAB — GLUCOSE, CAPILLARY
GLUCOSE-CAPILLARY: 267 mg/dL — AB (ref 70–99)
GLUCOSE-CAPILLARY: 252 mg/dL — AB (ref 70–99)
Glucose-Capillary: 252 mg/dL — ABNORMAL HIGH (ref 70–99)
Glucose-Capillary: 244 mg/dL — ABNORMAL HIGH (ref 70–99)

## 2018-07-23 LAB — HEPARIN LEVEL (UNFRACTIONATED): Heparin Unfractionated: 0.91 IU/mL — ABNORMAL HIGH (ref 0.30–0.70)

## 2018-07-23 LAB — CBC
HCT: 35.2 % — ABNORMAL LOW (ref 36.0–46.0)
Hemoglobin: 10.1 g/dL — ABNORMAL LOW (ref 12.0–15.0)
MCH: 26.9 pg (ref 26.0–34.0)
MCHC: 28.7 g/dL — ABNORMAL LOW (ref 30.0–36.0)
MCV: 93.9 fL (ref 80.0–100.0)
Platelets: 271 10*3/uL (ref 150–400)
RBC: 3.75 MIL/uL — AB (ref 3.87–5.11)
RDW: 15.9 % — ABNORMAL HIGH (ref 11.5–15.5)
WBC: 7.7 10*3/uL (ref 4.0–10.5)
nRBC: 0.8 % — ABNORMAL HIGH (ref 0.0–0.2)

## 2018-07-23 LAB — MRSA PCR SCREENING: MRSA by PCR: NEGATIVE

## 2018-07-23 MED ORDER — NYSTATIN 100000 UNIT/ML MT SUSP
5.0000 mL | Freq: Four times a day (QID) | OROMUCOSAL | Status: DC
  Administered 2018-07-24 – 2018-07-25 (×5): 500000 [IU] via ORAL
  Filled 2018-07-23 (×5): qty 5

## 2018-07-23 MED ORDER — ENOXAPARIN SODIUM 40 MG/0.4ML ~~LOC~~ SOLN
40.0000 mg | Freq: Every day | SUBCUTANEOUS | Status: DC
  Administered 2018-07-23 – 2018-07-25 (×3): 40 mg via SUBCUTANEOUS
  Filled 2018-07-23 (×3): qty 0.4

## 2018-07-23 MED ORDER — HYDRALAZINE HCL 25 MG PO TABS
25.0000 mg | ORAL_TABLET | Freq: Four times a day (QID) | ORAL | Status: DC | PRN
  Administered 2018-07-23: 25 mg via ORAL
  Filled 2018-07-23: qty 1

## 2018-07-23 MED ORDER — HYDROCODONE-ACETAMINOPHEN 5-325 MG PO TABS
1.0000 | ORAL_TABLET | Freq: Four times a day (QID) | ORAL | Status: DC | PRN
  Administered 2018-07-23 – 2018-07-25 (×5): 1 via ORAL
  Filled 2018-07-23 (×5): qty 1

## 2018-07-23 NOTE — Progress Notes (Signed)
Inpatient Diabetes Program Recommendations  AACE/ADA: New Consensus Statement on Inpatient Glycemic Control (2015)  Target Ranges:  Prepandial:   less than 140 mg/dL      Peak postprandial:   less than 180 mg/dL (1-2 hours)      Critically ill patients:  140 - 180 mg/dL   Lab Results  Component Value Date   GLUCAP 252 (H) 07/23/2018   HGBA1C 6.1 (H) 08/15/2017   Results for [name], [name] (MRN [id number]) as of 07/23/2018 14:45  Ref. Range 07/17/2018 12:50 07/22/2018 17:37 07/23/2018 06:05  Glucose Latest Ref Range: 70 - 99 mg/dL 130 (H) 307 (H) 306 (H)    12/11 Currently taking 100 mg Prednisone 2 times/day.  Please consider adding novolog sensitive  correction scale tid.  [place], CDE. M.Ed. Pager [phone] Inpatient Diabetes Coordinator

## 2018-07-23 NOTE — Evaluation (Signed)
Physical Therapy Evaluation Patient Details Name: [name] MRN: [id number] DOB: 28-Jun-1946 Today's Date: 07/23/2018   History of Present Illness  [name] is a 72 y.o. 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 Patient recently presented to her oncologist with worsening shortness of breath.  She was diagnosed with pneumonitis of the left lung by x-ray and CT with contrast done on July 17, 2018.  She was started on prednisone 1 mg/kg, rounded off to 100 mg daily on July 18, 2018.  Patient's shortness of breath was stable until this morning when it became suddenly worse when she was getting ready to come to her office.  She has been compliant with prednisone.  Repeat x-ray done in the office showing slightly more prominent findings in the left lung.  Infection in that area is also possible.  It is thought that patient might not be showing all of the signs of infection/inflammation secondary to steroids.  Oncology recommended hospitalization and close monitoring with antibiotics.  If symptoms do not improve, they will consider infliximab to reverse immunotherapy induced pneumonitis.    Clinical Impression  Patient functioning near baseline for functional mobility and gait other than mostly limited by O2 desaturation during ambulation while on room air.  Patient's O2 saturation dropped to 83% before making it back to room and put back on 2 LPM O2 - RN notified.  Patient tolerated sitting up in chair to eat breakfast after therapy.  Plan:  Patient discharged from physical therapy to care of nursing for ambulation daily as tolerated for length of stay.     Follow Up Recommendations No PT follow up    Equipment Recommendations  None recommended by PT    Recommendations for Other Services       Precautions /  Restrictions Precautions Precautions: None Restrictions Weight Bearing Restrictions: No      Mobility  Bed Mobility Overal bed mobility: Independent                Transfers Overall transfer level: Independent                  Ambulation/Gait Ambulation/Gait assistance: Modified independent (Device/Increase time) Gait Distance (Feet): 120 Feet Assistive device: None Gait Pattern/deviations: WFL(Within Functional Limits) Gait velocity: decreased   General Gait Details: demonstrates good return for ambulation in hallway without loss of balance, on room air and desaturated to 83% before making it back to room  Stairs            Wheelchair Mobility    Modified Rankin (Stroke Patients Only)       Balance Overall balance assessment: No apparent balance deficits (not formally assessed)                                           Pertinent Vitals/Pain Pain Assessment: No/denies pain    Home Living Family/patient expects to be discharged to:: Private residence Living Arrangements: Spouse/significant other;Children Available Help at Discharge: Family Type of Home: House Home Access: Stairs to enter Entrance Stairs-Rails: Right Entrance Stairs-Number of Steps: 4 Home Layout: One level Home Equipment: None      Prior Function Level of Independence: Needs assistance   Gait / Transfers Assistance Needed: household ambulator without AD  ADL's / Homemaking Assistance Needed: family assist with household and community ADLs        Hand Dominance        Extremity/Trunk Assessment   Upper Extremity Assessment Upper Extremity Assessment: Overall WFL for tasks assessed    Lower Extremity Assessment Lower Extremity Assessment: Overall WFL for tasks assessed    Cervical / Trunk Assessment Cervical / Trunk Assessment: Normal  Communication   Communication: No difficulties  Cognition Arousal/Alertness: Awake/alert Behavior  During Therapy: WFL for tasks assessed/performed Overall Cognitive Status: Within Functional Limits for tasks assessed                                        General Comments      Exercises     Assessment/Plan    PT Assessment Patent does not need any further PT services  PT Problem List         PT Treatment Interventions      PT Goals (Current goals can be found in the Care Plan section)  Acute Rehab PT Goals Patient Stated Goal: return home PT Goal Formulation: With patient Time For Goal Achievement: 07/23/18 Potential to Achieve Goals: Good    Frequency     Barriers to discharge        Co-evaluation               AM-PAC PT "6 Clicks" Mobility  Outcome Measure Help needed turning from your back to your side while in a flat bed without using bedrails?: None Help needed moving from lying on your back to sitting on the side of a flat bed without using bedrails?: None Help needed moving to and from a bed to a chair (including a wheelchair)?: None Help needed standing up from a chair using your arms (e.g., wheelchair or bedside chair)?: None Help needed to walk in hospital room?: None Help needed climbing 3-5 steps with a railing? : None 6 Click Score: 24    End of Session   Activity Tolerance: Patient tolerated treatment well;Patient limited by fatigue(Patient limited by O2 desaturation) Patient left: in chair;with call bell/phone within reach Nurse Communication: Mobility status PT Visit Diagnosis: Unsteadiness on feet (R26.81);Other abnormalities of gait and mobility (R26.89);Muscle weakness (generalized) (M62.81)    Time: 1610-9604 PT Time Calculation (min) (ACUTE ONLY): 30 min   Charges:   PT Evaluation $PT Eval Moderate Complexity: 1 Mod PT Treatments $Therapeutic Activity: 23-37 mins        11:57 AM, 07/23/18 Lonell Grandchild, MPT Physical Therapist with [facility] [phone] office [phone] mobile  phone

## 2018-07-23 NOTE — Progress Notes (Signed)
PROGRESS NOTE    [name]  FAO:[id number] DOB: 10/22/1945 DOA: 07/22/2018 PCP: Fayrene Helper, MD    Brief Narrative:  72 year old female with a history of stage III lung cancer, currently on immunotherapy, admitted directly from the cancer center with progressive shortness of breath and increased work of breathing.  Found to have pneumonitis on recent imaging.  Symptoms felt to be related to immunotherapy versus infectious process.  Currently on high-dose steroids and antibiotics.  If she does not improve, oncology may consider starting her on Remicade.   Assessment & Plan:   Principal Problem:   SOB (shortness of breath) Active Problems:   HLD (hyperlipidemia)   Anxiety   HTN (hypertension)   GERD (gastroesophageal reflux disease)   Anxiety and depression   Squamous cell lung cancer, left (HCC)   Chest pain   Chronic pain   Chronic constipation   Physical deconditioning   1. Acute respiratory failure with hypoxia.  Felt to be related to left lung pneumonitis versus infection.  Patient has been receiving immunotherapy for lung cancer.  It is felt that durvalumab was causing pneumonitis and she was started on high-dose prednisone as an outpatient.  When her symptoms persisted, she was admitted for further treatment.  She is currently on high-dose prednisone as well as intravenous antibiotics.  If her symptoms do not improve, oncology is considering giving her Remicade.  She is continued on cefepime for antibiotic coverage at this time.  Vancomycin discontinued since MRSA PCR was negative.  CTA chest was negative for pulmonary embolus. 2. Chest pain.  She is ruled out for ACS with negative cardiac markers.  Suspect this is pleurisy from pneumonitis 3. Squamous cell lung cancer, stage IIIa.  Followed by oncology.  She had received chemotherapy and radiation and completed these treatments and 03/2018.  Currently on immunotherapy. 4. Chronic pain.  Continue on tizanidine and  opiates as needed 5. Hyperlipidemia.  Continue statin 6. GERD.  Continue PPI 7. Hypertension.  Continue on Cardizem 8. Depression/anxiety.  Continue Prozac and Xanax   DVT prophylaxis: Lovenox Code Status: DNR Family Communication: Discussed with husband at the bedside Disposition Plan: Discharge home once respiratory status has improved and she is able to ambulate without significant shortness of breath   Consultants:   Oncology  Procedures:     Antimicrobials:   Cefepime 12/10 >  Vancomycin 12/10 > 12/11   Subjective: Still feels short of breath.  Has some cough and wheezing.  Becomes short of breath on exertion  Objective: Vitals:   07/23/18 0430 07/23/18 0549 07/23/18 0940 07/23/18 1348  BP: (!) 184/86   (!) 161/81  Pulse: 84   89  Resp: 20   18  Temp: 98.2 F (36.8 C)   98 F (36.7 C)  TempSrc: Oral   Oral  SpO2: 100% 99% 99% 100%  Weight:      Height:        Intake/Output Summary (Last 24 hours) at 07/23/2018 1835 Last data filed at 07/23/2018 1504 Gross per 24 hour  Intake 971.84 ml  Output 700 ml  Net 271.84 ml   Filed Weights   07/22/18 1618  Weight: 106.4 kg    Examination:  General exam: Appears calm and comfortable  Respiratory system: Mild wheeze bilaterally. Respiratory effort normal. Cardiovascular system: S1 & S2 heard, RRR. No JVD, murmurs, rubs, gallops or clicks. No pedal edema. Gastrointestinal system: Abdomen is nondistended, soft and nontender. No organomegaly or masses felt. Normal bowel sounds heard. Central  nervous system: Alert and oriented. No focal neurological deficits. Extremities: Symmetric 5 x 5 power. Skin: No rashes, lesions or ulcers Psychiatry: Judgement and insight appear normal. Mood & affect appropriate.     Data Reviewed: I have personally reviewed following labs and imaging studies  CBC: Recent Labs  Lab 07/17/18 1250 07/22/18 1737 07/23/18 0605  WBC 4.1 7.4 7.7  NEUTROABS 2.8  --   --   HGB  10.5* 10.1* 10.1*  HCT 35.5* 34.8* 35.2*  MCV 91.3 92.1 93.9  PLT 215 267 400   Basic Metabolic Panel: Recent Labs  Lab 07/17/18 1250 07/22/18 1737 07/23/18 0605  NA 141 142 140  K 3.3* 3.3* 4.0  CL 109 107 103  CO2 24 26 28   GLUCOSE 130* 307* 306*  BUN 14 18 19   CREATININE 0.77 0.95 0.88  CALCIUM 8.7* 8.3* 8.5*  MG  --  2.1  --    GFR: Estimated Creatinine Clearance: 70.8 mL/min (by C-G formula based on SCr of 0.88 mg/dL). Liver Function Tests: Recent Labs  Lab 07/17/18 1250 07/22/18 1737  AST 19 46*  ALT 12 36  ALKPHOS 61 74  BILITOT 0.3 0.4  PROT 6.5 6.1*  ALBUMIN 3.5 3.4*   No results for input(s): LIPASE, AMYLASE in the last 168 hours. No results for input(s): AMMONIA in the last 168 hours. Coagulation Profile: No results for input(s): INR, PROTIME in the last 168 hours. Cardiac Enzymes: Recent Labs  Lab 07/22/18 1737  TROPONINI <0.03   BNP (last 3 results) No results for input(s): PROBNP in the last 8760 hours. HbA1C: No results for input(s): HGBA1C in the last 72 hours. CBG: Recent Labs  Lab 07/23/18 0145 07/23/18 0726 07/23/18 1108 07/23/18 1609  GLUCAP 267* 252* 252* 244*   Lipid Profile: No results for input(s): CHOL, HDL, LDLCALC, TRIG, CHOLHDL, LDLDIRECT in the last 72 hours. Thyroid Function Tests: No results for input(s): TSH, T4TOTAL, FREET4, T3FREE, THYROIDAB in the last 72 hours. Anemia Panel: No results for input(s): VITAMINB12, FOLATE, FERRITIN, TIBC, IRON, RETICCTPCT in the last 72 hours. Sepsis Labs: Recent Labs  Lab 07/22/18 1737  PROCALCITON <0.10    Recent Results (from the past 240 hour(s))  MRSA PCR Screening     Status: None   Collection Time: 07/23/18 10:39 AM  Result Value Ref Range Status   MRSA by PCR NEGATIVE NEGATIVE Final    Comment:        The GeneXpert MRSA Assay (FDA approved for NASAL specimens only), is one component of a comprehensive MRSA colonization surveillance program. It is not intended to  diagnose MRSA infection nor to guide or monitor treatment for MRSA infections. Performed at [facility], [address], [place], [place] [address]          Radiology Studies: Dg Chest 2 View  Result Date: 07/22/2018 CLINICAL DATA:  Increased shortness of breath.  Left pneumonitis. EXAM: CHEST - 2 VIEW COMPARISON:  CT 07/17/2018. FINDINGS: PowerPort catheter with tip over superior vena cava. Prior median sternotomy. Heart size stable. Left upper lobe ill-defined density and adjacent infiltrate. Small left pleural effusion. Similar findings noted on prior CT of 07/17/2018. This consistent with lung cancer and changes related to treatment. IMPRESSION: 1.  PowerPort noted in stable position. 2. Left upper lung mass and adjacent interstitial changes consistent with known lung cancer and post treatment changes. Small left pleural effusion. Similar findings noted on prior exam. Electronically Signed   By: [place]   On: 07/22/2018 11:57  Ct Angio Chest Pe W Or Wo Contrast  Result Date: 07/23/2018 CLINICAL DATA:  72 year old female with shortness of breath. Concern for pulmonary embolism. Squamous cell carcinoma of the left upper lobe. Patient receiving immunotherapy. EXAM: CT ANGIOGRAPHY CHEST WITH CONTRAST TECHNIQUE: Multidetector CT imaging of the chest was performed using the standard protocol during bolus administration of intravenous contrast. Multiplanar CT image reconstructions and MIPs were obtained to evaluate the vascular anatomy. CONTRAST:  <See Chart> ISOVUE-370 IOPAMIDOL (ISOVUE-370) INJECTION 76% COMPARISON:  Chest CT dated 07/17/2018 and radiograph dated 07/22/2018 FINDINGS: Cardiovascular: Borderline enlarged cardiomegaly with dilatation of the atria and reflux of contrast from the right atrium into the IVC. No pericardial effusion. The thoracic aorta is unremarkable. There is dilatation of the main pulmonary trunk suggestive of pulmonary hypertension. No CT evidence of  pulmonary embolism. Mediastinum/Nodes: A 1.6 x 1.5 cm left suprahilar soft tissue contiguous with the 2 x 2 cm left upper lobe irregular nodule. The left suprahilar soft tissue may represent extension of malignancy into the left hilum or adenopathy. This is similar to the prior CT. No new adenopathy. A top-normal lymph node along the right lower lobe bronchus measures 10 mm in short axis (series 5, image 124) similar to prior CT. Soft tissue thickening in the anterior mediastinum similar to prior CT. The esophagus is grossly unremarkable. No mediastinal fluid collection. Lungs/Pleura: Small left pleural effusion similar or slightly decreased compared to the prior CT. Interval development of a trace right pleural effusion. There is emphysematous changes of the lungs with diffuse interstitial thickening and coarsening in the left upper lobe and superior segment of the left lower lobe similar to prior CT. Findings may represent an infectious process or pneumonitis or lymphangitic spread of tumor. A 7 x 7 mm nodule noted in the anterior left upper lobe in addition to the dominant nodule. No pneumothorax. The central airways are patent. A small endobronchial nodular focus in the lingula (series 5, image 96) may represent volume averaging artifact with peribronchial soft tissue although endobronchial extension of tumor is not entirely excluded. Upper Abdomen: No acute abnormality. Musculoskeletal: Median sternotomy wires. No acute osseous pathology. Review of the MIP images confirms the above findings. IMPRESSION: 1. No CT evidence of pulmonary embolism. 2. No significant interval change in the dominant and irregular left upper lobe nodule with extension into the left suprahilar region most consistent with known malignancy. 3. Emphysema with interstitial coarsening of the left upper lobe and superior segment of the left lower lobe as seen on the prior CT possibly related to an infectious process/pneumonitis or  carcinomatosis. 4. Small nodular density in the lingular bronchus may represent volume averaging artifact versus endobronchial extension of tumor. 5. Interval development of trace right pleural effusion. 6. Additional findings as above. Electronically Signed   By: Anner Crete M.D.   On: 07/23/2018 05:44        Scheduled Meds: . ALPRAZolam  1 mg Oral BID  . aspirin EC  81 mg Oral Daily  . celecoxib  400 mg Oral Daily  . cholecalciferol  2,000 Units Oral Daily  . dextromethorphan-guaiFENesin  1 tablet Oral BID  . diltiazem  120 mg Oral Daily  . docusate sodium  200 mg Oral BID  . enoxaparin (LOVENOX) injection  40 mg Subcutaneous Daily  . FLUoxetine  20 mg Oral Daily  . lactulose  10 g Oral QHS  . mometasone-formoterol  2 puff Inhalation BID  . pantoprazole  40 mg Oral Daily  . predniSONE  100 mg Oral BID WC  . rosuvastatin  20 mg Oral q1800  . tiZANidine  4 mg Oral TID   Continuous Infusions: . ceFEPime (MAXIPIME) IV Stopped (07/23/18 1356)     LOS: 1 day    Time spent: 30 minutes    Kathie Dike, MD [facility] Pager [phone]  If 7PM-7AM, please contact night-coverage [email or web address] Password [facility] 07/23/2018, 6:35 PM

## 2018-07-23 NOTE — Progress Notes (Signed)
BS 267 @0145  machine did not upload reading.

## 2018-07-23 NOTE — Progress Notes (Signed)
Pt back from CT

## 2018-07-24 ENCOUNTER — Inpatient Hospital Stay (HOSPITAL_COMMUNITY): Payer: PPO

## 2018-07-24 DIAGNOSIS — K5909 Other constipation: Secondary | ICD-10-CM

## 2018-07-24 DIAGNOSIS — I361 Nonrheumatic tricuspid (valve) insufficiency: Secondary | ICD-10-CM

## 2018-07-24 DIAGNOSIS — R5381 Other malaise: Secondary | ICD-10-CM

## 2018-07-24 DIAGNOSIS — I340 Nonrheumatic mitral (valve) insufficiency: Secondary | ICD-10-CM

## 2018-07-24 LAB — GLUCOSE, CAPILLARY
Glucose-Capillary: 277 mg/dL — ABNORMAL HIGH (ref 70–99)
Glucose-Capillary: 190 mg/dL — ABNORMAL HIGH (ref 70–99)
Glucose-Capillary: 268 mg/dL — ABNORMAL HIGH (ref 70–99)
Glucose-Capillary: 180 mg/dL — ABNORMAL HIGH (ref 70–99)
Glucose-Capillary: 287 mg/dL — ABNORMAL HIGH (ref 70–99)

## 2018-07-24 LAB — ECHOCARDIOGRAM COMPLETE
Height: 64 in
Weight: 3753.6 oz

## 2018-07-24 MED ORDER — ALPRAZOLAM 1 MG PO TABS
1.0000 mg | ORAL_TABLET | Freq: Two times a day (BID) | ORAL | Status: DC | PRN
  Administered 2018-07-24 – 2018-07-25 (×2): 1 mg via ORAL
  Filled 2018-07-24 (×2): qty 1

## 2018-07-24 MED ORDER — INSULIN ASPART 100 UNIT/ML ~~LOC~~ SOLN
5.0000 [IU] | Freq: Three times a day (TID) | SUBCUTANEOUS | Status: DC
  Administered 2018-07-24 – 2018-07-25 (×3): 5 [IU] via SUBCUTANEOUS

## 2018-07-24 MED ORDER — INSULIN ASPART 100 UNIT/ML ~~LOC~~ SOLN
0.0000 [IU] | Freq: Three times a day (TID) | SUBCUTANEOUS | Status: DC
  Administered 2018-07-24: 5 [IU] via SUBCUTANEOUS
  Administered 2018-07-24 – 2018-07-25 (×3): 2 [IU] via SUBCUTANEOUS

## 2018-07-24 MED ORDER — INSULIN GLARGINE 100 UNIT/ML ~~LOC~~ SOLN
14.0000 [IU] | Freq: Every day | SUBCUTANEOUS | Status: DC
  Administered 2018-07-24 – 2018-07-25 (×2): 14 [IU] via SUBCUTANEOUS
  Filled 2018-07-24 (×5): qty 0.14

## 2018-07-24 MED ORDER — DOXYCYCLINE HYCLATE 100 MG PO TABS
100.0000 mg | ORAL_TABLET | Freq: Two times a day (BID) | ORAL | Status: DC
  Administered 2018-07-24 – 2018-07-25 (×3): 100 mg via ORAL
  Filled 2018-07-24 (×3): qty 1

## 2018-07-24 MED ORDER — INSULIN ASPART 100 UNIT/ML ~~LOC~~ SOLN
0.0000 [IU] | Freq: Every day | SUBCUTANEOUS | Status: DC
  Administered 2018-07-24: 3 [IU] via SUBCUTANEOUS

## 2018-07-24 NOTE — Care Management Note (Signed)
Case Management Note  Patient Details  Name: [name] MRN: [id number] Date of Birth: 12/15/1945  Subjective/Objective:         Admitted with SOB. Pt from home, lives with husband, ind with ADL's. Pt has insurance and PCP. PT recommends no f/u. Pt does not qualify for home oxygen. No CM needs communicated.             Action/Plan: DC home today with self care. No CM needs.   Expected Discharge Date:     07/24/18             Expected Discharge Plan:  [place]  In-House Referral:  NA  Discharge planning Services  CM Consult  Post Acute Care Choice:  NA Choice offered to:  NA  Status of Service:  Completed, signed off  Sherald Barge, RN 07/24/2018, 1:15 PM

## 2018-07-24 NOTE — Progress Notes (Addendum)
PROGRESS NOTE    [name]  JKD:[id number] DOB: 13-Jul-1946 DOA: 07/22/2018 PCP: Fayrene Helper, MD    Brief Narrative:  72 year old female with a history of stage III lung cancer, currently on immunotherapy, admitted directly from the cancer center with progressive shortness of breath and increased work of breathing.  Found to have pneumonitis on recent imaging.  Symptoms felt to be related to immunotherapy versus infectious process.  Currently on high-dose steroids and antibiotics.  If she does not improve, oncology may consider starting Remicade.  Assessment & Plan:   Principal Problem:   SOB (shortness of breath) Active Problems:   HLD (hyperlipidemia)   Anxiety   HTN (hypertension)   GERD (gastroesophageal reflux disease)   Anxiety and depression   Squamous cell lung cancer, left (HCC)   Chest pain   Chronic pain   Chronic constipation   Physical deconditioning  1. Acute respiratory failure with hypoxia-Slowly improving.  Still requiring oxygen but down to 2 Liters.  Plan to try weaning to room air today if tolerated.  Felt to be related to left lung pneumonitis versus infection.  Patient has been receiving immunotherapy for lung cancer.  It is felt that durvalumab was causing pneumonitis and she was started on high-dose prednisone as an outpatient.  When her symptoms persisted, she was admitted for further treatment.  She is currently on high-dose prednisone as well as intravenous antibiotics.  If her symptoms do not improve, oncology is considering giving her Remicade.  DC cefepime.  Start oral doxycycline.  Vancomycin discontinued since MRSA PCR was negative.  CTA chest was negative for pulmonary embolus. 2. Chest pain.  She is ruled out for ACS with negative cardiac markers.  Suspect this is pleurisy from pneumonitis 3. Squamous cell lung cancer, stage IIIa.  Followed by oncology.  She had received chemotherapy and radiation and completed these treatments and 03/2018.   Currently on immunotherapy. 4. Steroid induced hyperglycemia - Pt remains on high dose prednisone, added lantus, prandial novolog and SSI coverage. Follow CBGs and adjust insulin doses as needed.  5. Chronic pain.  Continue on tizanidine and opiates as needed. DC celebrex while on high dose steroids.  6. Hyperlipidemia.  Continue statin 7. GERD.  Continue PPI especially while on high dose steroids.  8. Hypertension.  Continue on Cardizem 9. Depression/anxiety.  Continue home Prozac and alprazolam (prn).   DVT prophylaxis: Lovenox Code Status: DNR Family Communication: Discussed with husband at the bedside Disposition Plan: Discharge home tomorrow if continues to improve  Consultants:   Oncology  Procedures:     Antimicrobials:   Cefepime 12/10 > 12/12  Vancomycin 12/10 > 12/11  Doxycycline 12/12 >  Subjective: Pt not ambulating well.  Still wheezing but has some improvement.  Still requiring supplemental oxygen.   Objective: Vitals:   07/23/18 2131 07/23/18 2232 07/24/18 0649 07/24/18 0810  BP: (!) 179/89  (!) 145/73   Pulse: 85  85   Resp: 20  20   Temp: 97.9 F (36.6 C)  97.9 F (36.6 C)   TempSrc: Oral  Oral   SpO2: 100% 96% 97% 96%  Weight:      Height:        Intake/Output Summary (Last 24 hours) at 07/24/2018 0908 Last data filed at 07/24/2018 0600 Gross per 24 hour  Intake 1124 ml  Output 1550 ml  Net -426 ml   Filed Weights   07/22/18 1618  Weight: 106.4 kg    Examination:  General exam: Appears  calm and comfortable, NAD.  Respiratory system: Mild expiratory bibasilar wheezes heard. Respiratory effort normal. Cardiovascular system: normal S1 & S2 heard. No JVD, murmurs, rubs, gallops or clicks. No pedal edema. Gastrointestinal system: Abdomen is nondistended, soft and nontender. No organomegaly or masses felt. Normal bowel sounds heard. Central nervous system: Alert and oriented. No focal neurological deficits. Extremities: Symmetric 5 x 5  power. Skin: No rashes, lesions or ulcers Psychiatry: Judgement and insight appear normal. Mood & affect appropriate.    Data Reviewed: I have personally reviewed following labs and imaging studies  CBC: Recent Labs  Lab 07/17/18 1250 07/22/18 1737 07/23/18 0605  WBC 4.1 7.4 7.7  NEUTROABS 2.8  --   --   HGB 10.5* 10.1* 10.1*  HCT 35.5* 34.8* 35.2*  MCV 91.3 92.1 93.9  PLT 215 267 440   Basic Metabolic Panel: Recent Labs  Lab 07/17/18 1250 07/22/18 1737 07/23/18 0605  NA 141 142 140  K 3.3* 3.3* 4.0  CL 109 107 103  CO2 24 26 28   GLUCOSE 130* 307* 306*  BUN 14 18 19   CREATININE 0.77 0.95 0.88  CALCIUM 8.7* 8.3* 8.5*  MG  --  2.1  --    GFR: Estimated Creatinine Clearance: 70.8 mL/min (by C-G formula based on SCr of 0.88 mg/dL). Liver Function Tests: Recent Labs  Lab 07/17/18 1250 07/22/18 1737  AST 19 46*  ALT 12 36  ALKPHOS 61 74  BILITOT 0.3 0.4  PROT 6.5 6.1*  ALBUMIN 3.5 3.4*   No results for input(s): LIPASE, AMYLASE in the last 168 hours. No results for input(s): AMMONIA in the last 168 hours. Coagulation Profile: No results for input(s): INR, PROTIME in the last 168 hours. Cardiac Enzymes: Recent Labs  Lab 07/22/18 1737  TROPONINI <0.03   BNP (last 3 results) No results for input(s): PROBNP in the last 8760 hours. HbA1C: No results for input(s): HGBA1C in the last 72 hours. CBG: Recent Labs  Lab 07/23/18 0145 07/23/18 0726 07/23/18 1108 07/23/18 1609  GLUCAP 267* 252* 252* 244*   Lipid Profile: No results for input(s): CHOL, HDL, LDLCALC, TRIG, CHOLHDL, LDLDIRECT in the last 72 hours. Thyroid Function Tests: No results for input(s): TSH, T4TOTAL, FREET4, T3FREE, THYROIDAB in the last 72 hours. Anemia Panel: No results for input(s): VITAMINB12, FOLATE, FERRITIN, TIBC, IRON, RETICCTPCT in the last 72 hours. Sepsis Labs: Recent Labs  Lab 07/22/18 1737  PROCALCITON <0.10    Recent Results (from the past 240 hour(s))  MRSA PCR  Screening     Status: None   Collection Time: 07/23/18 10:39 AM  Result Value Ref Range Status   MRSA by PCR NEGATIVE NEGATIVE Final    Comment:        The GeneXpert MRSA Assay (FDA approved for NASAL specimens only), is one component of a comprehensive MRSA colonization surveillance program. It is not intended to diagnose MRSA infection nor to guide or monitor treatment for MRSA infections. Performed at [facility], [address] [place], [place] [address]     Radiology Studies: Dg Chest 2 View  Result Date: 07/22/2018 CLINICAL DATA:  Increased shortness of breath.  Left pneumonitis. EXAM: CHEST - 2 VIEW COMPARISON:  CT 07/17/2018. FINDINGS: PowerPort catheter with tip over superior vena cava. Prior median sternotomy. Heart size stable. Left upper lobe ill-defined density and adjacent infiltrate. Small left pleural effusion. Similar findings noted on prior CT of 07/17/2018. This consistent with lung cancer and changes related to treatment. IMPRESSION: 1.  PowerPort noted in  stable position. 2. Left upper lung mass and adjacent interstitial changes consistent with known lung cancer and post treatment changes. Small left pleural effusion. Similar findings noted on prior exam. Electronically Signed   By: [place]   On: 07/22/2018 11:57   Ct Angio Chest Pe W Or Wo Contrast  Result Date: 07/23/2018 CLINICAL DATA:  72 year old female with shortness of breath. Concern for pulmonary embolism. Squamous cell carcinoma of the left upper lobe. Patient receiving immunotherapy. EXAM: CT ANGIOGRAPHY CHEST WITH CONTRAST TECHNIQUE: Multidetector CT imaging of the chest was performed using the standard protocol during bolus administration of intravenous contrast. Multiplanar CT image reconstructions and MIPs were obtained to evaluate the vascular anatomy. CONTRAST:  <See Chart> ISOVUE-370 IOPAMIDOL (ISOVUE-370) INJECTION 76% COMPARISON:  Chest CT dated 07/17/2018 and radiograph dated 07/22/2018  FINDINGS: Cardiovascular: Borderline enlarged cardiomegaly with dilatation of the atria and reflux of contrast from the right atrium into the IVC. No pericardial effusion. The thoracic aorta is unremarkable. There is dilatation of the main pulmonary trunk suggestive of pulmonary hypertension. No CT evidence of pulmonary embolism. Mediastinum/Nodes: A 1.6 x 1.5 cm left suprahilar soft tissue contiguous with the 2 x 2 cm left upper lobe irregular nodule. The left suprahilar soft tissue may represent extension of malignancy into the left hilum or adenopathy. This is similar to the prior CT. No new adenopathy. A top-normal lymph node along the right lower lobe bronchus measures 10 mm in short axis (series 5, image 124) similar to prior CT. Soft tissue thickening in the anterior mediastinum similar to prior CT. The esophagus is grossly unremarkable. No mediastinal fluid collection. Lungs/Pleura: Small left pleural effusion similar or slightly decreased compared to the prior CT. Interval development of a trace right pleural effusion. There is emphysematous changes of the lungs with diffuse interstitial thickening and coarsening in the left upper lobe and superior segment of the left lower lobe similar to prior CT. Findings may represent an infectious process or pneumonitis or lymphangitic spread of tumor. A 7 x 7 mm nodule noted in the anterior left upper lobe in addition to the dominant nodule. No pneumothorax. The central airways are patent. A small endobronchial nodular focus in the lingula (series 5, image 96) may represent volume averaging artifact with peribronchial soft tissue although endobronchial extension of tumor is not entirely excluded. Upper Abdomen: No acute abnormality. Musculoskeletal: Median sternotomy wires. No acute osseous pathology. Review of the MIP images confirms the above findings. IMPRESSION: 1. No CT evidence of pulmonary embolism. 2. No significant interval change in the dominant and  irregular left upper lobe nodule with extension into the left suprahilar region most consistent with known malignancy. 3. Emphysema with interstitial coarsening of the left upper lobe and superior segment of the left lower lobe as seen on the prior CT possibly related to an infectious process/pneumonitis or carcinomatosis. 4. Small nodular density in the lingular bronchus may represent volume averaging artifact versus endobronchial extension of tumor. 5. Interval development of trace right pleural effusion. 6. Additional findings as above. Electronically Signed   By: Anner Crete M.D.   On: 07/23/2018 05:44   Scheduled Meds: . aspirin EC  81 mg Oral Daily  . cholecalciferol  2,000 Units Oral Daily  . dextromethorphan-guaiFENesin  1 tablet Oral BID  . diltiazem  120 mg Oral Daily  . docusate sodium  200 mg Oral BID  . doxycycline  100 mg Oral Q12H  . enoxaparin (LOVENOX) injection  40 mg Subcutaneous Daily  . FLUoxetine  20 mg Oral Daily  . insulin aspart  0-5 Units Subcutaneous QHS  . insulin aspart  0-9 Units Subcutaneous TID WC  . insulin aspart  5 Units Subcutaneous TID WC  . insulin glargine  14 Units Subcutaneous Daily  . lactulose  10 g Oral QHS  . mometasone-formoterol  2 puff Inhalation BID  . nystatin  5 mL Oral QID  . pantoprazole  40 mg Oral Daily  . predniSONE  100 mg Oral BID WC  . rosuvastatin  20 mg Oral q1800  . tiZANidine  4 mg Oral TID   Continuous Infusions:   LOS: 2 days    Time spent: 30 minutes  Irwin Brakeman, MD [facility] Pager [phone]  If 7PM-7AM, please contact night-coverage [email or web address] Password [email or web address] 07/24/2018, 9:08 AM

## 2018-07-24 NOTE — Plan of Care (Signed)
SATURATION QUALIFICATIONS: (This note is used to comply with regulatory documentation for home oxygen)  Patient Saturations on Room Air at Rest = 96%  Patient Saturations on Room Air while Ambulating = 90%  Please briefly explain why patient needs home oxygen: Patient did not require supplemental oxygen to maintain o2 sats above 90 % while ambulating.

## 2018-07-24 NOTE — Progress Notes (Signed)
*  PRELIMINARY RESULTS* Echocardiogram 2D Echocardiogram has been performed.  [name] 07/24/2018, 10:27 AM

## 2018-07-25 ENCOUNTER — Telehealth: Payer: Self-pay

## 2018-07-25 ENCOUNTER — Other Ambulatory Visit: Payer: Self-pay | Admitting: Family Medicine

## 2018-07-25 LAB — GLUCOSE, CAPILLARY
GLUCOSE-CAPILLARY: 174 mg/dL — AB (ref 70–99)
Glucose-Capillary: 298 mg/dL — ABNORMAL HIGH (ref 70–99)

## 2018-07-25 MED ORDER — DILTIAZEM HCL ER COATED BEADS 120 MG PO CP24: 120.0000 mg | ORAL_CAPSULE | Freq: Every morning | ORAL | Status: AC

## 2018-07-25 MED ORDER — DOXYCYCLINE HYCLATE 100 MG PO TABS: 100.0000 mg | ORAL_TABLET | Freq: Two times a day (BID) | ORAL | 0 refills | Status: DC

## 2018-07-25 MED ORDER — LISINOPRIL 10 MG PO TABS: 10.0000 mg | ORAL_TABLET | Freq: Every morning | ORAL | Status: AC

## 2018-07-25 MED ORDER — LEVOFLOXACIN 500 MG PO TABS: 500.0000 mg | ORAL_TABLET | Freq: Every day | ORAL | 0 refills | Status: AC

## 2018-07-25 MED ORDER — FUROSEMIDE 20 MG PO TABS: 40.0000 mg | ORAL_TABLET | Freq: Every morning | ORAL | Status: AC

## 2018-07-25 MED ORDER — NYSTATIN 100000 UNIT/ML MT SUSP: 5.0000 mL | Freq: Four times a day (QID) | OROMUCOSAL | 0 refills | Status: AC

## 2018-07-25 MED ORDER — POTASSIUM CHLORIDE CRYS ER 20 MEQ PO TBCR: 20.0000 meq | EXTENDED_RELEASE_TABLET | Freq: Every morning | ORAL | Status: AC

## 2018-07-25 MED ORDER — ROSUVASTATIN CALCIUM 20 MG PO TABS: 20.0000 mg | ORAL_TABLET | Freq: Every day | ORAL | Status: AC

## 2018-07-25 MED ORDER — PREDNISONE 20 MG PO TABS: 100.0000 mg | ORAL_TABLET | Freq: Two times a day (BID) | ORAL | 0 refills | Status: AC

## 2018-07-25 MED ORDER — FLUOXETINE HCL 20 MG PO CAPS: 20.0000 mg | ORAL_CAPSULE | Freq: Every morning | ORAL | Status: AC

## 2018-07-25 MED ORDER — OMEPRAZOLE 40 MG PO CPDR: 40.0000 mg | DELAYED_RELEASE_CAPSULE | Freq: Every morning | ORAL | Status: AC

## 2018-07-25 NOTE — Care Management Important Message (Signed)
Important Message  Patient Details  Name: [name] MRN: [id number] Date of Birth: [birth year]   Medicare Important Message Given:  Yes    Sherald Barge, RN 07/25/2018, 8:47 AM

## 2018-07-25 NOTE — Telephone Encounter (Signed)
Transition Care Management Follow-up Telephone Call   Date discharged? 07-25-18         How have you been since you were released from the hospital? felling better    Do you understand why you were in the hospital? Yes couldn't breathe    Do you understand the discharge instructions? Yes I do    Where were you discharged to?Home     Items Reviewed:  Medications reviewed: Yes   Allergies reviewed: Yes   Dietary changes reviewed: Heart healthy   Referrals reviewed: Yes    Functional Questionnaire:   Activities of Daily Living (ADLs):  Able to perform    Any transportation issues/concerns?: None   Any patient concerns? Just medication refills    Confirmed importance and date/time of follow-up visits scheduled Appointment made    Confirmed with patient if condition begins to worsen call PCP or go to the ER.  Patient was given the office number and encouraged to call back with question or concerns.  :

## 2018-07-25 NOTE — Discharge Instructions (Signed)
PLEASE STOP TAKING THE CELEBREX WHILE YOU ARE ON THE HIGH DOSE STEROIDS.   PLEASE FOLLOW UP WITH ONCOLOGY ON 07/30/18 AT 9AM AS SCHEDULED.   SEEK MEDICAL CARE OR RETURN TO ER IF SYMPTOMS COME BACK, WORSEN OR NEW PROBLEMS DEVELOP.

## 2018-07-25 NOTE — Discharge Summary (Addendum)
Physician Discharge Summary  KAHLEA Kramer VFI:[id number] DOB: 12/11/1945 DOA: 07/22/2018  PCP: Fayrene Helper, MD Oncology: Dr. Delton Coombes  Admit date: 07/22/2018 Discharge date: 07/25/2018  Admitted From:  Home  Disposition: Home  Recommendations for Outpatient Follow-up:  1. Follow up with oncologist on 12/18 at 9am as scheduled  Discharge Condition: STABLE   CODE STATUS: DNR    Brief Hospitalization Summary: Please see all hospital notes, images, labs for full details of the hospitalization. HPI: [name] is a 72 y.o. 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 Patient recently presented to her oncologist with worsening shortness of breath.  She was diagnosed with pneumonitis of the left lung by x-ray and CT with contrast done on July 17, 2018.  She was started on prednisone 1 mg/kg, rounded off to 100 mg daily on July 18, 2018.  Patient's shortness of breath was stable until this morning when it became suddenly worse when she was getting ready to come to her office.  She has been compliant with prednisone.  Repeat x-ray done in the office showing slightly more prominent findings in the left lung.  Infection in that area is also possible.  It is thought that patient might not be showing all of the signs of infection/inflammation secondary to steroids.  Oncology recommended hospitalization and close monitoring with antibiotics.  If symptoms do not improve, they will consider infliximab to reverse immunotherapy induced pneumonitis.  Patient states she has been feeling short of breath for the past 5 days.  Also reports having a dry cough and wheezing. Denies having any fatigue.  States symptoms persisted despite taking prednisone.  States she felt so short of breath this morning that she had to use her  husband's home oxygen.  Her shortness of breath is at rest.  Reports feeling cold, not sure if she is having fevers.  Also reports having an episode of chest pain this morning when she was having shortness of breath.  The chest pain was substernal and subsided in 30 minutes.  Denies prior history of MI or coronary artery disease.  Denies prior history of blood clots.  Brief Narrative:  72 year old female with a history of stage III lung cancer, currently on immunotherapy, admitted directly from the cancer center with progressive shortness of breath and increased work of breathing.  Found to have pneumonitis on recent imaging.  Symptoms felt to be related to immunotherapy versus infectious process.  Currently on high-dose steroids and antibiotics.  If she does not improve, oncology may consider starting Remicade.  Assessment & Plan:   Principal Problem:   SOB (shortness of breath) Active Problems:   HLD (hyperlipidemia)   Anxiety   HTN (hypertension)   GERD (gastroesophageal reflux disease)   Anxiety and depression   Squamous cell lung cancer, left (HCC)   Chest pain   Chronic pain   Chronic constipation   Physical deconditioning  1. Acute respiratory failure with hypoxia - Improved and back to baseline.  Pt has been weaned to room air.  Her symptoms were felt to be related to left lung pneumonitis versus infection.  She was treated for both and is recovering.  Patient has been receiving immunotherapy for lung cancer.  It is felt that durvalumab was causing pneumonitis and she was started on high-dose prednisone as an outpatient.  When her symptoms persisted, she was  admitted for further treatment.  She is currently on high-dose prednisone as well as intravenous antibiotics.    Finish with levaquin as recommended by oncology.  cefepime and Vancomycin discontinued.  CTA chest was negative for pulmonary embolus.  Prednisone 100 mg BID.  Follow up with oncology for further weaning and management.    2. Chest pain.  Symptoms resolved now.   She was ruled out for ACS with negative cardiac markers.  Suspect this is pleurisy from pneumonitis.   3. Squamous cell lung cancer, stage IIIa.  Followed by oncology.  She had received chemotherapy and radiation and completed these treatments and 03/2018.  Currently on immunotherapy.  Follow up with oncology on 12/18 at 9am as already scheduled.  4. Chronic pain.  Continue on tizanidine and opiates as needed. DC celebrex while on high dose steroids.  5. Hyperlipidemia.  Continue statin 6. GERD.  Continue PPI especially while on high dose steroids.  7. Hypertension.  Continue on Cardizem 8. Depression/anxiety.  Continue home Prozac and alprazolam (prn).  Echocardiogram 07/24/18 Study Conclusions  - Left ventricle: The cavity size was normal. Wall thickness was   increased in a pattern of mild LVH. Systolic function was normal.   The estimated ejection fraction was in the range of 60% to 65%.   Wall motion was normal; there were no regional wall motion   abnormalities. Doppler parameters are consistent with abnormal   left ventricular relaxation (grade 1 diastolic dysfunction).   Doppler parameters are consistent with high ventricular filling   pressure. - Aortic valve: Mildly to moderately calcified annulus. Trileaflet;   mildly thickened leaflets. Sclerosis without stenosis. There was   trivial regurgitation. - Mitral valve: Moderately calcified annulus. There was mild   regurgitation. - Left atrium: The atrium was mildly dilated. - Tricuspid valve: There was mild regurgitation. - Pulmonary arteries: PA peak pressure: 42 mm Hg (S).   DVT prophylaxis: Lovenox Code Status: DNR Family Communication: Discussed with husband at the bedside Disposition Plan: Discharge home  Consultants:   Oncology  Procedures:     Antimicrobials:   Cefepime 12/10 > 12/12  Vancomycin 12/10 > 12/11  Doxycycline 12/12 >  Discharge Diagnoses:   Principal Problem:   SOB (shortness of breath) Active Problems:   HLD (hyperlipidemia)   Anxiety   HTN (hypertension)   GERD (gastroesophageal reflux disease)   Anxiety and depression   Squamous cell lung cancer, left (HCC)   Chest pain   Chronic pain   Chronic constipation   Physical deconditioning Discharge Instructions: Discharge Instructions    Call MD for:  difficulty breathing, headache or visual disturbances   Complete by:  As directed    Call MD for:  extreme fatigue   Complete by:  As directed    Call MD for:  persistant dizziness or light-headedness   Complete by:  As directed    Call MD for:  persistant nausea and vomiting   Complete by:  As directed    Call MD for:  severe uncontrolled pain   Complete by:  As directed    Increase activity slowly   Complete by:  As directed      Allergies as of 07/25/2018      Reactions   Bee Venom Anaphylaxis   Penicillins Itching   Blisters in mouth after dental work Has patient had a PCN reaction causing immediate rash, facial/tongue/throat swelling, SOB or lightheadedness with hypotension: no Has patient had a PCN reaction causing severe rash involving mucus membranes or  skin necrosis: No Has patient had a PCN reaction that required hospitalization No Has patient had a PCN reaction occurring within the last 10 years: Yes If all of the above answers are "NO", then may proceed with Cephalosporin use.   Codeine       Medication List    STOP taking these medications   celecoxib 400 MG capsule Commonly known as:  CELEBREX   IMFINZI IV   lidocaine 2 % solution Commonly known as:  XYLOCAINE     TAKE these medications   albuterol 108 (90 Base) MCG/ACT inhaler Commonly known as:  PROAIR HFA INHALE 2 PUFFS BY MOUTH EVERY 6 HOURS IF NEEDED What changed:    how much to take  how to take this  when to take this  reasons to take this  additional instructions   ALPRAZolam 1 MG tablet Commonly known as:   XANAX Take 1 tablet (1 mg total) by mouth 2 (two) times daily.   ASPIRIN LOW DOSE 81 MG EC tablet Generic drug:  aspirin Take 81 mg by mouth daily.   diltiazem 120 MG 24 hr capsule Commonly known as:  CARDIZEM CD Take 1 capsule (120 mg total) by mouth every morning.   diphenhydrAMINE 25 mg capsule Commonly known as:  BENADRYL Take 25-50 mg by mouth at bedtime as needed for sleep.   docusate sodium 100 MG capsule Commonly known as:  COLACE Take 100-200 mg by mouth 2 (two) times daily as needed for mild constipation or moderate constipation.   EPINEPHrine 0.3 mg/0.3 mL Soaj injection Commonly known as:  EPI-PEN Inject 0.3 mg into the muscle once.   FLUoxetine 20 MG capsule Commonly known as:  PROZAC Take 1 capsule (20 mg total) by mouth every morning.   furosemide 20 MG tablet Commonly known as:  LASIX Take 2 tablets (40 mg total) by mouth every morning. Start taking on:  July 28, 2018 What changed:    how much to take  when to take this  These instructions start on July 28, 2018. If you are unsure what to do until then, ask your doctor or other care provider.   guaiFENesin 600 MG 12 hr tablet Commonly known as:  MUCINEX Take 1 tablet (600 mg total) by mouth 2 (two) times daily.   HYDROcodone-acetaminophen 5-325 MG tablet Commonly known as:  NORCO/VICODIN Take one tablet three times daily for back pain What changed:    how much to take  how to take this  when to take this  additional instructions   lactulose 10 GM/15ML solution Commonly known as:  CHRONULAC Take 15-32ml at bedtime every night to assist with moving bowels.  Titrate down if having multiple bowel movements.   levofloxacin 500 MG tablet Commonly known as:  LEVAQUIN Take 1 tablet (500 mg total) by mouth daily for 7 days.   lidocaine-prilocaine cream Commonly known as:  EMLA Apply a quarter size amount to port site 1 hour prior to chemo. Do not rub in. Cover with plastic wrap.    lisinopril 10 MG tablet Commonly known as:  PRINIVIL,ZESTRIL Take 1 tablet (10 mg total) by mouth every morning.   nystatin 100000 UNIT/ML suspension Commonly known as:  MYCOSTATIN Take 5 mLs (500,000 Units total) by mouth 4 (four) times daily for 5 days.   omeprazole 40 MG capsule Commonly known as:  PRILOSEC Take 1 capsule (40 mg total) by mouth every morning.   potassium chloride SA 20 MEQ tablet Commonly known as:  K-DUR,KLOR-CON Take  1 tablet (20 mEq total) by mouth every morning. Start taking on:  July 28, 2018 What changed:    when to take this  These instructions start on July 28, 2018. If you are unsure what to do until then, ask your doctor or other care provider.   predniSONE 20 MG tablet Commonly known as:  DELTASONE Take 5 tablets (100 mg total) by mouth 2 (two) times daily with a meal for 7 days. What changed:  when to take this   prochlorperazine 10 MG tablet Commonly known as:  COMPAZINE Take 10 mg by mouth every 6 (six) hours as needed for nausea or vomiting.   rosuvastatin 20 MG tablet Commonly known as:  CRESTOR Take 1 tablet (20 mg total) by mouth at bedtime. What changed:  See the new instructions.   SYMBICORT 80-4.5 MCG/ACT inhaler Generic drug:  budesonide-formoterol INHALE 2 PUFFS BY MOUTH TWICE DAILY( RINSE MOUTH AFTER USE)   tiZANidine 4 MG tablet Commonly known as:  ZANAFLEX TAKE 1 TABLET(4 MG) BY MOUTH THREE TIMES DAILY What changed:  See the new instructions.   Vitamin D3 50 MCG (2000 UT) capsule Take 2,000 Units by mouth daily.      Follow-up Information    Derek Jack, MD. Go on 07/30/2018.   Specialty:  Hematology Why:  at 9 AM as already scheduled.  Contact information: [place] [address] [phone]          Allergies  Allergen Reactions  . Bee Venom Anaphylaxis  . Penicillins Itching    Blisters in mouth after dental work Has patient had a PCN reaction causing immediate rash,  facial/tongue/throat swelling, SOB or lightheadedness with hypotension: no Has patient had a PCN reaction causing severe rash involving mucus membranes or skin necrosis: No Has patient had a PCN reaction that required hospitalization No Has patient had a PCN reaction occurring within the last 10 years: Yes If all of the above answers are "NO", then may proceed with Cephalosporin use.   . Codeine    Allergies as of 07/25/2018      Reactions   Bee Venom Anaphylaxis   Penicillins Itching   Blisters in mouth after dental work Has patient had a PCN reaction causing immediate rash, facial/tongue/throat swelling, SOB or lightheadedness with hypotension: no Has patient had a PCN reaction causing severe rash involving mucus membranes or skin necrosis: No Has patient had a PCN reaction that required hospitalization No Has patient had a PCN reaction occurring within the last 10 years: Yes If all of the above answers are "NO", then may proceed with Cephalosporin use.   Codeine       Medication List    STOP taking these medications   celecoxib 400 MG capsule Commonly known as:  CELEBREX   IMFINZI IV   lidocaine 2 % solution Commonly known as:  XYLOCAINE     TAKE these medications   albuterol 108 (90 Base) MCG/ACT inhaler Commonly known as:  PROAIR HFA INHALE 2 PUFFS BY MOUTH EVERY 6 HOURS IF NEEDED What changed:    how much to take  how to take this  when to take this  reasons to take this  additional instructions   ALPRAZolam 1 MG tablet Commonly known as:  XANAX Take 1 tablet (1 mg total) by mouth 2 (two) times daily.   ASPIRIN LOW DOSE 81 MG EC tablet Generic drug:  aspirin Take 81 mg by mouth daily.   diltiazem 120 MG 24 hr capsule  Commonly known as:  CARDIZEM CD Take 1 capsule (120 mg total) by mouth every morning.   diphenhydrAMINE 25 mg capsule Commonly known as:  BENADRYL Take 25-50 mg by mouth at bedtime as needed for sleep.   docusate sodium 100 MG  capsule Commonly known as:  COLACE Take 100-200 mg by mouth 2 (two) times daily as needed for mild constipation or moderate constipation.   EPINEPHrine 0.3 mg/0.3 mL Soaj injection Commonly known as:  EPI-PEN Inject 0.3 mg into the muscle once.   FLUoxetine 20 MG capsule Commonly known as:  PROZAC Take 1 capsule (20 mg total) by mouth every morning.   furosemide 20 MG tablet Commonly known as:  LASIX Take 2 tablets (40 mg total) by mouth every morning. Start taking on:  July 28, 2018 What changed:    how much to take  when to take this  These instructions start on July 28, 2018. If you are unsure what to do until then, ask your doctor or other care provider.   guaiFENesin 600 MG 12 hr tablet Commonly known as:  MUCINEX Take 1 tablet (600 mg total) by mouth 2 (two) times daily.   HYDROcodone-acetaminophen 5-325 MG tablet Commonly known as:  NORCO/VICODIN Take one tablet three times daily for back pain What changed:    how much to take  how to take this  when to take this  additional instructions   lactulose 10 GM/15ML solution Commonly known as:  CHRONULAC Take 15-53ml at bedtime every night to assist with moving bowels.  Titrate down if having multiple bowel movements.   levofloxacin 500 MG tablet Commonly known as:  LEVAQUIN Take 1 tablet (500 mg total) by mouth daily for 7 days.   lidocaine-prilocaine cream Commonly known as:  EMLA Apply a quarter size amount to port site 1 hour prior to chemo. Do not rub in. Cover with plastic wrap.   lisinopril 10 MG tablet Commonly known as:  PRINIVIL,ZESTRIL Take 1 tablet (10 mg total) by mouth every morning.   nystatin 100000 UNIT/ML suspension Commonly known as:  MYCOSTATIN Take 5 mLs (500,000 Units total) by mouth 4 (four) times daily for 5 days.   omeprazole 40 MG capsule Commonly known as:  PRILOSEC Take 1 capsule (40 mg total) by mouth every morning.   potassium chloride SA 20 MEQ tablet Commonly  known as:  K-DUR,KLOR-CON Take 1 tablet (20 mEq total) by mouth every morning. Start taking on:  July 28, 2018 What changed:    when to take this  These instructions start on July 28, 2018. If you are unsure what to do until then, ask your doctor or other care provider.   predniSONE 20 MG tablet Commonly known as:  DELTASONE Take 5 tablets (100 mg total) by mouth 2 (two) times daily with a meal for 7 days. What changed:  when to take this   prochlorperazine 10 MG tablet Commonly known as:  COMPAZINE Take 10 mg by mouth every 6 (six) hours as needed for nausea or vomiting.   rosuvastatin 20 MG tablet Commonly known as:  CRESTOR Take 1 tablet (20 mg total) by mouth at bedtime. What changed:  See the new instructions.   SYMBICORT 80-4.5 MCG/ACT inhaler Generic drug:  budesonide-formoterol INHALE 2 PUFFS BY MOUTH TWICE DAILY( RINSE MOUTH AFTER USE)   tiZANidine 4 MG tablet Commonly known as:  ZANAFLEX TAKE 1 TABLET(4 MG) BY MOUTH THREE TIMES DAILY What changed:  See the new instructions.   Vitamin D3 50  MCG (2000 UT) capsule Take 2,000 Units by mouth daily.       Procedures/Studies: Dg Chest 2 View  Result Date: 07/22/2018 CLINICAL DATA:  Increased shortness of breath.  Left pneumonitis. EXAM: CHEST - 2 VIEW COMPARISON:  CT 07/17/2018. FINDINGS: PowerPort catheter with tip over superior vena cava. Prior median sternotomy. Heart size stable. Left upper lobe ill-defined density and adjacent infiltrate. Small left pleural effusion. Similar findings noted on prior CT of 07/17/2018. This consistent with lung cancer and changes related to treatment. IMPRESSION: 1.  PowerPort noted in stable position. 2. Left upper lung mass and adjacent interstitial changes consistent with known lung cancer and post treatment changes. Small left pleural effusion. Similar findings noted on prior exam. Electronically Signed   By: [place]   On: 07/22/2018 11:57   Dg Chest 2  View  Result Date: 07/17/2018 CLINICAL DATA:  Cough and shortness of breath for 1 week. History of lung cancer. EXAM: CHEST - 2 VIEW COMPARISON:  Radiographs 06/03/2018 FINDINGS: The cardiac silhouette, mediastinal and hilar contours are within normal limits and stable. The left subclavian power port is stable. New left upper lobe interstitial process, possibly drug-induced pneumonitis or interstitial spread of tumor. No focal airspace consolidation or new pulmonary nodules. Small left pleural effusion. IMPRESSION: New coarse interstitial process in the left upper lobe as detailed above. Small left effusion. Electronically Signed   By: Marijo Sanes M.D.   On: 07/17/2018 17:17   Ct Chest W Contrast  Result Date: 07/17/2018 CLINICAL DATA:  Squamous cell carcinoma of the left upper lobe. Shortness of breath and cough. Patient receiving immunotherapy. EXAM: CT CHEST WITH CONTRAST TECHNIQUE: Multidetector CT imaging of the chest was performed during intravenous contrast administration. CONTRAST:  68mL OMNIPAQUE IOHEXOL 300 MG/ML  SOLN COMPARISON:  CT scan 04/30/2018 FINDINGS: Cardiovascular: The heart is normal in size. No pericardial effusion. The aorta is normal in caliber. No dissection. No significant atherosclerotic calcifications. The branch vessels are patent. Minimal scattered coronary artery calcifications. Mediastinum/Nodes: Persistent soft tissue thickening in the anterior mediastinum wrapping around the aortic arch. This is likely residual thymic tissue. No enlarged mediastinal or hilar lymph nodes. The esophagus is grossly normal. Lungs/Pleura: Persistent nodular mass in the left upper lobe measuring approximately 18.5 x 17.5 mm on image number 43 of series 4. There is a new extensive interstitial process involving the left lung with marked interstitial thickening with a honeycomb type pattern likely drug related pneumonitis. Interstitial spread of tumor would be another possibility. The right lung is  not affected. There is also a small to moderate-sized left pleural effusion. Stable underlying emphysematous changes. Upper Abdomen: Stable intra and extrahepatic biliary dilatation likely due to prior cholecystectomy. Musculoskeletal: No significant bony findings. IMPRESSION: 1. New coarse interstitial disease involving the left upper lobe and upper aspect of the left lower lobe. Findings could be due to drug related pneumonitis or interstitial spread of tumor. There is also a new small to moderate-sized left pleural effusion. 2. Persistent nodular left upper lobe lung mass measuring 18.5 x 17.5 mm. 3. No mediastinal or hilar adenopathy. Stable treated matted soft tissue density in the left suprahilar region. Aortic Atherosclerosis (ICD10-I70.0) and Emphysema (ICD10-J43.9). Electronically Signed   By: Marijo Sanes M.D.   On: 07/17/2018 17:16   Ct Angio Chest Pe W Or Wo Contrast  Result Date: 07/23/2018 CLINICAL DATA:  72 year old female with shortness of breath. Concern for pulmonary embolism. Squamous cell carcinoma of the left upper lobe. Patient receiving immunotherapy.  EXAM: CT ANGIOGRAPHY CHEST WITH CONTRAST TECHNIQUE: Multidetector CT imaging of the chest was performed using the standard protocol during bolus administration of intravenous contrast. Multiplanar CT image reconstructions and MIPs were obtained to evaluate the vascular anatomy. CONTRAST:  <See Chart> ISOVUE-370 IOPAMIDOL (ISOVUE-370) INJECTION 76% COMPARISON:  Chest CT dated 07/17/2018 and radiograph dated 07/22/2018 FINDINGS: Cardiovascular: Borderline enlarged cardiomegaly with dilatation of the atria and reflux of contrast from the right atrium into the IVC. No pericardial effusion. The thoracic aorta is unremarkable. There is dilatation of the main pulmonary trunk suggestive of pulmonary hypertension. No CT evidence of pulmonary embolism. Mediastinum/Nodes: A 1.6 x 1.5 cm left suprahilar soft tissue contiguous with the 2 x 2 cm left  upper lobe irregular nodule. The left suprahilar soft tissue may represent extension of malignancy into the left hilum or adenopathy. This is similar to the prior CT. No new adenopathy. A top-normal lymph node along the right lower lobe bronchus measures 10 mm in short axis (series 5, image 124) similar to prior CT. Soft tissue thickening in the anterior mediastinum similar to prior CT. The esophagus is grossly unremarkable. No mediastinal fluid collection. Lungs/Pleura: Small left pleural effusion similar or slightly decreased compared to the prior CT. Interval development of a trace right pleural effusion. There is emphysematous changes of the lungs with diffuse interstitial thickening and coarsening in the left upper lobe and superior segment of the left lower lobe similar to prior CT. Findings may represent an infectious process or pneumonitis or lymphangitic spread of tumor. A 7 x 7 mm nodule noted in the anterior left upper lobe in addition to the dominant nodule. No pneumothorax. The central airways are patent. A small endobronchial nodular focus in the lingula (series 5, image 96) may represent volume averaging artifact with peribronchial soft tissue although endobronchial extension of tumor is not entirely excluded. Upper Abdomen: No acute abnormality. Musculoskeletal: Median sternotomy wires. No acute osseous pathology. Review of the MIP images confirms the above findings. IMPRESSION: 1. No CT evidence of pulmonary embolism. 2. No significant interval change in the dominant and irregular left upper lobe nodule with extension into the left suprahilar region most consistent with known malignancy. 3. Emphysema with interstitial coarsening of the left upper lobe and superior segment of the left lower lobe as seen on the prior CT possibly related to an infectious process/pneumonitis or carcinomatosis. 4. Small nodular density in the lingular bronchus may represent volume averaging artifact versus endobronchial  extension of tumor. 5. Interval development of trace right pleural effusion. 6. Additional findings as above. Electronically Signed   By: Anner Crete M.D.   On: 07/23/2018 05:44     Subjective: Pt says she is feeling much better and she is dressed and ready to go home.  Husband coming to pick up.  No chest pain and no SOB.    Discharge Exam: Vitals:   07/25/18 0559 07/25/18 0809  BP: 125/76   Pulse: 87   Resp: 20   Temp: 98.4 F (36.9 C)   SpO2: 98% 95%   Vitals:   07/24/18 2049 07/24/18 2150 07/25/18 0559 07/25/18 0809  BP:  (!) 149/102 125/76   Pulse:  100 87   Resp:  20 20   Temp:  97.8 F (36.6 C) 98.4 F (36.9 C)   TempSrc:  Oral Oral   SpO2: 99% 95% 98% 95%  Weight:      Height:       General exam: Appears calm and comfortable, NAD.  Respiratory  system: BBS Clear. Respiratory effort normal. Cardiovascular system: normal S1 & S2 heard. No JVD, murmurs, rubs, gallops or clicks. No pedal edema. Gastrointestinal system: Abdomen is nondistended, soft and nontender. No organomegaly or masses felt. Normal bowel sounds heard. Central nervous system: Alert and oriented. No focal neurological deficits. Extremities: Symmetric 5 x 5 power. Skin: No rashes, lesions or ulcers Psychiatry: Judgement and insight appear normal. Mood & affect appropriate.    The results of significant diagnostics from this hospitalization (including imaging, microbiology, ancillary and laboratory) are listed below for reference.     Microbiology: Recent Results (from the past 240 hour(s))  MRSA PCR Screening     Status: None   Collection Time: 07/23/18 10:39 AM  Result Value Ref Range Status   MRSA by PCR NEGATIVE NEGATIVE Final    Comment:        The GeneXpert MRSA Assay (FDA approved for NASAL specimens only), is one component of a comprehensive MRSA colonization surveillance program. It is not intended to diagnose MRSA infection nor to guide or monitor treatment for MRSA  infections. Performed at [facility], [address], [place], [place] [address]      Labs: BNP (last 3 results) Recent Labs    10/28/17 1702  BNP 00.9   Basic Metabolic Panel: Recent Labs  Lab 07/22/18 1737 07/23/18 0605  NA 142 140  K 3.3* 4.0  CL 107 103  CO2 26 28  GLUCOSE 307* 306*  BUN 18 19  CREATININE 0.95 0.88  CALCIUM 8.3* 8.5*  MG 2.1  --    Liver Function Tests: Recent Labs  Lab 07/22/18 1737  AST 46*  ALT 36  ALKPHOS 74  BILITOT 0.4  PROT 6.1*  ALBUMIN 3.4*   No results for input(s): LIPASE, AMYLASE in the last 168 hours. No results for input(s): AMMONIA in the last 168 hours. CBC: Recent Labs  Lab 07/22/18 1737 07/23/18 0605  WBC 7.4 7.7  HGB 10.1* 10.1*  HCT 34.8* 35.2*  MCV 92.1 93.9  PLT 267 271   Cardiac Enzymes: Recent Labs  Lab 07/22/18 1737  TROPONINI <0.03   BNP: Invalid input(s): POCBNP CBG: Recent Labs  Lab 07/24/18 1127 07/24/18 1605 07/24/18 2147 07/25/18 0339 07/25/18 0739  GLUCAP 268* 180* 287* 298* 174*   D-Dimer Recent Labs    07/22/18 1737  DDIMER 1.07*   Hgb A1c No results for input(s): HGBA1C in the last 72 hours. Lipid Profile No results for input(s): CHOL, HDL, LDLCALC, TRIG, CHOLHDL, LDLDIRECT in the last 72 hours. Thyroid function studies No results for input(s): TSH, T4TOTAL, T3FREE, THYROIDAB in the last 72 hours.  Invalid input(s): FREET3 Anemia work up No results for input(s): VITAMINB12, FOLATE, FERRITIN, TIBC, IRON, RETICCTPCT in the last 72 hours. Urinalysis    Component Value Date/Time   COLORURINE YELLOW 03/23/2010 1030   APPEARANCEUR CLOUDY (A) 03/23/2010 1030   LABSPEC 1.030 03/23/2010 1030   PHURINE 5.0 03/23/2010 1030   GLUCOSEU NEGATIVE 03/23/2010 1030   HGBUR NEGATIVE 03/23/2010 1030   BILIRUBINUR negative 05/31/2014 1118   KETONESUR 15 (A) 03/23/2010 1030   PROTEINUR negative 05/31/2014 1118   PROTEINUR NEGATIVE 03/23/2010 1030   UROBILINOGEN 0.2 05/31/2014 1118    UROBILINOGEN 1.0 03/23/2010 1030   NITRITE negative 05/31/2014 1118   NITRITE NEGATIVE 03/23/2010 1030   LEUKOCYTESUR Trace 05/31/2014 1118   Sepsis Labs Invalid input(s): PROCALCITONIN,  WBC,  LACTICIDVEN Microbiology Recent Results (from the past 240 hour(s))  MRSA PCR Screening     Status:  None   Collection Time: 07/23/18 10:39 AM  Result Value Ref Range Status   MRSA by PCR NEGATIVE NEGATIVE Final    Comment:        The GeneXpert MRSA Assay (FDA approved for NASAL specimens only), is one component of a comprehensive MRSA colonization surveillance program. It is not intended to diagnose MRSA infection nor to guide or monitor treatment for MRSA infections. Performed at [facility], [address], [place], [place] [address]    Time coordinating discharge: [place]  SIGNED:  Irwin Brakeman, MD  [facility] 07/25/2018, 9:35 AM Pager [phone]  If 7PM-7AM, please contact night-coverage [email or web address] Password [email or web address]

## 2018-07-25 NOTE — Progress Notes (Signed)
IV removed, 2x2 gauze and paper tape applied to site, patient tolerated well.  Reviewed AVS with patient who verbalized understanding. All questions answered.  Patient transported home by her husband.

## 2018-07-28 ENCOUNTER — Ambulatory Visit (HOSPITAL_COMMUNITY): Payer: PPO

## 2018-07-29 ENCOUNTER — Telehealth (HOSPITAL_COMMUNITY): Payer: Self-pay | Admitting: *Deleted

## 2018-07-29 NOTE — Telephone Encounter (Signed)
Patient called clinic stating that she was running a fever.  She states that she doesn't have a thermometer but she felt hot and was sweating this morning.  She denies any other symptoms, no cough, congestion, runny nose, no urinary symptoms, no gastric symptoms.  She just c/o feeling hot.  I advised her to get her a thermometer, which she said her husband is going to buy one now, take her temperature and if she has elevated temperature take tylenol as directed on the bottle to treat the fever.  She is to keep her appointment tomorrow with Dr. Delton Coombes.

## 2018-07-30 ENCOUNTER — Encounter: Payer: Self-pay | Admitting: Family Medicine

## 2018-07-30 ENCOUNTER — Inpatient Hospital Stay (HOSPITAL_BASED_OUTPATIENT_CLINIC_OR_DEPARTMENT_OTHER): Payer: PPO | Admitting: Hematology

## 2018-07-30 ENCOUNTER — Other Ambulatory Visit: Payer: Self-pay

## 2018-07-30 ENCOUNTER — Inpatient Hospital Stay (HOSPITAL_COMMUNITY): Payer: PPO

## 2018-07-30 ENCOUNTER — Ambulatory Visit (INDEPENDENT_AMBULATORY_CARE_PROVIDER_SITE_OTHER): Payer: PPO | Admitting: Family Medicine

## 2018-07-30 ENCOUNTER — Encounter (HOSPITAL_COMMUNITY): Payer: Self-pay | Admitting: Hematology

## 2018-07-30 VITALS — BP 142/80 | HR 81 | Resp 17 | Ht 63.0 in | Wt 236.0 lb

## 2018-07-30 VITALS — BP 163/66 | HR 76 | Temp 98.8°F | Resp 20 | Wt 235.8 lb

## 2018-07-30 DIAGNOSIS — Z87891 Personal history of nicotine dependence: Secondary | ICD-10-CM

## 2018-07-30 DIAGNOSIS — F319 Bipolar disorder, unspecified: Secondary | ICD-10-CM

## 2018-07-30 DIAGNOSIS — Z800 Family history of malignant neoplasm of digestive organs: Secondary | ICD-10-CM | POA: Diagnosis not present

## 2018-07-30 DIAGNOSIS — F419 Anxiety disorder, unspecified: Secondary | ICD-10-CM

## 2018-07-30 DIAGNOSIS — E785 Hyperlipidemia, unspecified: Secondary | ICD-10-CM

## 2018-07-30 DIAGNOSIS — J189 Pneumonia, unspecified organism: Secondary | ICD-10-CM

## 2018-07-30 DIAGNOSIS — M549 Dorsalgia, unspecified: Secondary | ICD-10-CM

## 2018-07-30 DIAGNOSIS — Z8042 Family history of malignant neoplasm of prostate: Secondary | ICD-10-CM

## 2018-07-30 DIAGNOSIS — M129 Arthropathy, unspecified: Secondary | ICD-10-CM

## 2018-07-30 DIAGNOSIS — F329 Major depressive disorder, single episode, unspecified: Secondary | ICD-10-CM

## 2018-07-30 DIAGNOSIS — Z801 Family history of malignant neoplasm of trachea, bronchus and lung: Secondary | ICD-10-CM

## 2018-07-30 DIAGNOSIS — Z808 Family history of malignant neoplasm of other organs or systems: Secondary | ICD-10-CM

## 2018-07-30 DIAGNOSIS — Z9221 Personal history of antineoplastic chemotherapy: Secondary | ICD-10-CM | POA: Diagnosis not present

## 2018-07-30 DIAGNOSIS — Z09 Encounter for follow-up examination after completed treatment for conditions other than malignant neoplasm: Secondary | ICD-10-CM

## 2018-07-30 DIAGNOSIS — C3492 Malignant neoplasm of unspecified part of left bronchus or lung: Secondary | ICD-10-CM

## 2018-07-30 DIAGNOSIS — Z79899 Other long term (current) drug therapy: Secondary | ICD-10-CM | POA: Diagnosis not present

## 2018-07-30 DIAGNOSIS — J449 Chronic obstructive pulmonary disease, unspecified: Secondary | ICD-10-CM | POA: Diagnosis not present

## 2018-07-30 DIAGNOSIS — G8929 Other chronic pain: Secondary | ICD-10-CM | POA: Diagnosis not present

## 2018-07-30 DIAGNOSIS — K219 Gastro-esophageal reflux disease without esophagitis: Secondary | ICD-10-CM | POA: Diagnosis not present

## 2018-07-30 DIAGNOSIS — I10 Essential (primary) hypertension: Secondary | ICD-10-CM | POA: Diagnosis not present

## 2018-07-30 DIAGNOSIS — Z7982 Long term (current) use of aspirin: Secondary | ICD-10-CM | POA: Diagnosis not present

## 2018-07-30 DIAGNOSIS — C3412 Malignant neoplasm of upper lobe, left bronchus or lung: Secondary | ICD-10-CM

## 2018-07-30 DIAGNOSIS — D509 Iron deficiency anemia, unspecified: Secondary | ICD-10-CM

## 2018-07-30 DIAGNOSIS — R072 Precordial pain: Secondary | ICD-10-CM | POA: Diagnosis not present

## 2018-07-30 LAB — COMPREHENSIVE METABOLIC PANEL
ALT: 24 U/L (ref 0–44)
AST: 19 U/L (ref 15–41)
Albumin: 3.4 g/dL — ABNORMAL LOW (ref 3.5–5.0)
Alkaline Phosphatase: 90 U/L (ref 38–126)
Anion gap: 13 (ref 5–15)
BILIRUBIN TOTAL: 0.7 mg/dL (ref 0.3–1.2)
BUN: 25 mg/dL — ABNORMAL HIGH (ref 8–23)
CO2: 34 mmol/L — ABNORMAL HIGH (ref 22–32)
Calcium: 8.9 mg/dL (ref 8.9–10.3)
Chloride: 90 mmol/L — ABNORMAL LOW (ref 98–111)
Creatinine, Ser: 0.88 mg/dL (ref 0.44–1.00)
GFR calc non Af Amer: >60 mL/min (ref >60)
Glucose, Bld: 338 mg/dL — ABNORMAL HIGH (ref 70–99)
Potassium: 3.6 mmol/L (ref 3.5–5.1)
Sodium: 137 mmol/L (ref 135–145)
Total Protein: 6 g/dL — ABNORMAL LOW (ref 6.5–8.1)

## 2018-07-30 LAB — CBC WITH DIFFERENTIAL/PLATELET
Abs Immature Granulocytes: 0.24 10*3/uL — ABNORMAL HIGH (ref 0.00–0.07)
Basophils Absolute: 0 10*3/uL (ref 0.0–0.1)
Basophils Relative: 0 %
Eosinophils Absolute: 0 10*3/uL (ref 0.0–0.5)
Eosinophils Relative: 0 %
HEMATOCRIT: 36 % (ref 36.0–46.0)
Hemoglobin: 10.6 g/dL — ABNORMAL LOW (ref 12.0–15.0)
Immature Granulocytes: 2 %
Lymphocytes Relative: 3 %
Lymphs Abs: 0.4 10*3/uL — ABNORMAL LOW (ref 0.7–4.0)
MCH: 26.7 pg (ref 26.0–34.0)
MCHC: 29.4 g/dL — ABNORMAL LOW (ref 30.0–36.0)
MCV: 90.7 fL (ref 80.0–100.0)
MONO ABS: 0.7 10*3/uL (ref 0.1–1.0)
Monocytes Relative: 6 %
Neutro Abs: 10.3 10*3/uL — ABNORMAL HIGH (ref 1.7–7.7)
Neutrophils Relative %: 89 %
Platelets: 234 10*3/uL (ref 150–400)
RBC: 3.97 MIL/uL (ref 3.87–5.11)
RDW: 16.1 % — ABNORMAL HIGH (ref 11.5–15.5)
WBC: 11.6 10*3/uL — ABNORMAL HIGH (ref 4.0–10.5)
nRBC: 0.3 % — ABNORMAL HIGH (ref 0.0–0.2)

## 2018-07-30 MED ORDER — HYDROCODONE-ACETAMINOPHEN 5-325 MG PO TABS: ORAL_TABLET | ORAL | 0 refills | Status: DC

## 2018-07-30 MED ORDER — ALPRAZOLAM 1 MG PO TABS: 1.0000 mg | ORAL_TABLET | Freq: Two times a day (BID) | ORAL | 5 refills | Status: AC | PRN

## 2018-07-30 MED ORDER — SULFAMETHOXAZOLE-TRIMETHOPRIM 800-160 MG PO TABS: 1.0000 | ORAL_TABLET | ORAL | 1 refills | Status: AC

## 2018-07-30 MED ORDER — PREDNISONE 20 MG PO TABS: 80.0000 mg | ORAL_TABLET | Freq: Two times a day (BID) | ORAL | 0 refills | Status: DC

## 2018-07-30 MED ORDER — HYDROCODONE-ACETAMINOPHEN 5-325 MG PO TABS: ORAL_TABLET | ORAL | 0 refills | Status: AC

## 2018-07-30 NOTE — Progress Notes (Signed)
Fill Date ID Written Drug Qty Days Prescriber Rx # Pharmacy Refill Daily Dose * Pymt Type PMP  07/09/2018  3   05/01/2018  Alprazolam 1 Mg Tablet  60.00 30 Ma Sim  [id number]  [facility] ([id number])  2/3 4.00 LME Comm Ins  [place]  07/09/2018  3   05/01/2018  Hydrocodone-Acetamin 5-325 Mg  90.00 30 Ma Sim  [id number]  [facility] (0327)  0/0 15.00 MME Comm Ins  [place]  06/13/2018  3   05/01/2018  Alprazolam 1 Mg Tablet  50.00 30 Ma Sim  [id number]  [facility] (0327)  1/3 3.33 LME Comm Ins  [place]  06/11/2018  3   05/01/2018  Alprazolam 1 Mg Tablet  10.00 5 Ma Sim  [id number]  [facility] (0327)  1/3 4.00 LME Comm Ins  [place]  06/08/2018  3   05/01/2018  Hydrocodone-Acetamin 5-325 Mg  90.00 30 Ma Sim  [id number]  [facility] (0327)  0/0 15.00 MME Comm Ins  [place]  05/14/2018  3   05/01/2018  Alprazolam 1 Mg Tablet  60.00 30 Ma Sim  [id number]  [facility] (0327)  0/3 4.00 LME Comm Ins  [place]  05/09/2018  3   05/01/2018  Hydrocodone-Acetamin 5-325 Mg  90.00 30 Ma Sim  [id number]  [facility] (0327)  0/0 15.00 MME Comm Ins  [place]  04/15/2018  3   02/14/2018  Alprazolam 1 Mg Tablet  60.00 30 Ma Sim  [id number]  [facility] (0327)  0/0 4.00 LME Comm Ins  [place]  04/10/2018  3   04/10/2018  Morphine Sulf 100 Mg/5 Ml Conc  30.00 15 Ra Loc  [phone]  [facility] (9744)  0/0 40.00 MME Comm Ins  [place]  04/09/2018  3   02/09/2018  Hydrocodone-Acetamin 5-325 Mg  90.00 30 Ma Sim  [id number]  [facility] (0327)  0/0 15.00 MME Comm Ins  [place]  03/26/2018  3   03/26/2018  Morphine Sulf 100 Mg/5 Ml Conc  30.00 15 Ra Loc  [phone]  [facility] (9744)  0/0 40.00 MME Comm Ins  [place]  03/15/2018  3   02/14/2018  Alprazolam 1 Mg Tablet  60.00 30 Ma Sim  [id number]  [facility] (0327)  0/1 4.00 LME Comm Ins  [place]  03/10/2018  3   02/09/2018  Hydrocodone-Acetamin 5-325 Mg  90.00 30 Ma Sim  [id number]  [facility] (0327)  0/0 15.00 MME Comm Ins  [place]  02/16/2018  3   02/14/2018  Alprazolam 1 Mg Tablet  60.00 30 Ma Sim  [id number]  [facility] (0327)  0/2 4.00 LME Comm Ins  [place]  02/06/2018  3   02/06/2018  Hydrocodone-Acetamin 5-325 Mg  90.00 30 Ma Sim  [id number]  [facility] (0327)  0/0 15.00 MME Comm Ins  [place]  01/20/2018  3   11/06/2017  Alprazolam 1  Mg Tablet  60.00 30 Ma Sim  [id number]  [facility] (0327)  0/3 4.00 LME Comm Ins  [place]  01/08/2018  3   10/23/2017  Hydrocodone-Acetamin 5-325 Mg  90.00 30 Ma Sim  [id number]  [facility] (0327)  0/0 15.00 MME Comm Ins  [place]  12/21/2017  3   11/06/2017  Alprazolam 1 Mg Tablet  60.00 30 Ma Sim  [id number]  [facility] (0327)  1/5 4.00 LME Comm Ins  [place]  12/10/2017  3   10/23/2017  Hydrocodone-Acetamin 5-325 Mg  90.00 30 Ma Sim  [id number]  [facility] (0327)  0/0 15.00 MME Comm Ins    11/21/2017  3   11/06/2017  Alprazolam 1 Mg Tablet  60.00 30  Ma Sim  [phone]  Wal [phone])  0/5 4.00 LME Comm Ins  [place]  11/09/2017  3   11/07/2017  Hydrocodone-Acetamin 5-325 Mg  90.00 30 Ma Sim  [id number]  Wal (0327)  0/0 15.00 MME Comm Ins  [place]  10/19/2017  3   05/24/2017  Alprazolam 1 Mg Tablet  60.00 30 Ma Sim  [id number]  Wal (0327)  0/0 4.00 LME Comm Ins  [place]  10/10/2017  3   10/09/2017  Hydrocodone-Acetamin 5-325 Mg  90.00 30 Ma Sim  [id number]  Wal (0327)  0/0 15.00 MME Comm Ins  [place]  09/20/2017  2   05/24/2017  Alprazolam 1 Mg Tablet  60.00 30 Ma Sim  [phone]  Wal (0327)  4/5 4.00 LME Medicare  [place]  09/09/2017  1   08/14/2017  Hydrocodone-Acetamin 5-325 Mg  90.00 30 Ma Sim  [id number]  Wal (0327)  0/0 15.00 MME Medicare  [place]  08/22/2017  2   05/24/2017  Alprazolam 1 Mg Tablet  60.00 30 Ma Sim  [id number]  Wal (0327)  3/5 4.00 LME Medicare  [place]  08/07/2017  2   05/21/2017  Hydrocodone-Acetamin 5-325 Mg  90.00 30 Ma Sim  [phone]  Wal (0327)  0/0 15.00 MME Medicare  [place]  07/24/2017  2   05/24/2017  Alprazolam 1 Mg Tablet  60.00 30 Ma Sim  [phone]  Wal (2824)  2/5

## 2018-07-30 NOTE — Assessment & Plan Note (Signed)
Deteriorated. Patient re-educated about  the importance of commitment to a  minimum of 150 minutes of exercise per week.  The importance of healthy food choices with portion control discussed. Encouraged to start a food diary, count calories and to consider  joining a support group. Sample diet sheets offered. Goals set by the patient for the next several months.   Weight /BMI 07/30/2018 07/30/2018 07/22/2018  WEIGHT 236 lb 235 lb 12.8 oz 234 lb 9.6 oz  HEIGHT 5\' 3"  - 5\' 4"   BMI 41.81 kg/m2 40.47 kg/m2 40.27 kg/m2

## 2018-07-30 NOTE — Assessment & Plan Note (Signed)
Currently controlled on regime, no change at this time

## 2018-07-30 NOTE — Progress Notes (Signed)
   [name]     MRN: [id number]      DOB: [birth year]   HPI [name] is here for transitional visit and pain management.  Admitted with pneumonitis from 12/10 to 07/25/2018 a s/e of her chemotherapy, and has been on very high dose steroid therapy to correct breathing. She also has neb treatments which she is using 3 times daily to help also, breathing is improved, states she woke up unable to breathe the day she was directly admitted from Oncology clinic States she is still fighting her illness with a winning attitude despite the " bumps along the way" Hospital course is reviewed with patient and sh is encouraged to keep close Oncology follow up Chronic pain management is reviewed and will remain the same  ROS See HPI  Denies chest pains, palpitations and leg swelling Denies abdominal pain, nausea, vomiting,diarrhea or constipation.   Denies dysuria, frequency, hesitancy or incontinence. Denies uncontrolled  joint pain, swelling and limitation in mobility.States celebrex very beneficial and will resume once off of the prednisone Denies headaches, seizures, numbness, or tingling. Denies uncontrolled depression, anxiety or insomnia. Denies skin break down or rash.   PE  BP (!) 142/80   Pulse 81   Resp 17   Ht 5\' 3"  (1.6 m)   Wt 236 lb (107 kg)   SpO2 95%   BMI 41.81 kg/m   Patient alert and oriented and in no cardiopulmonary distress.  HEENT: No facial asymmetry, EOMI,   oropharynx pink and moist.  Neck supple no JVD, no mass.  Chest: decreased though adequate air entry, scattered wheezes, noi crackles  CVS: S1, S2 no murmurs, no S3.Regular rate.  ABD: Soft non tender.   Ext: No edema  MS: Decreased  ROM spine, adequate in shoulders, hips and knees.  Skin: Intact, no ulcerations or rash noted.  Psych: Good eye contact, normal affect. Memory intact not anxious or depressed appearing.  CNS: CN 2-12 intact, power,  normal throughout.no focal deficits  noted.   Assessment & Plan  Encounter for chronic pain management The patient's Controlled Substance registry is reviewed and compliance confirmed. Adequacy of  Pain control and level of function is assessed. Medication dosing is adjusted as deemed appropriate. Twelve weeks of medication is prescribed , patient signs for the script and is provided with a follow up appointment between 11 to 12 weeks .   Hospital discharge follow-up Hospital course reviewed and pt concerns addressed regarding her medication and management Improved and breathing comfortably on room air  Holding off of celebrex until she has completed her course of prednisone, and will resume afterward  Morbid obesity ([place]) Deteriorated. Patient re-educated about  the importance of commitment to a  minimum of 150 minutes of exercise per week.  The importance of healthy food choices with portion control discussed. Encouraged to start a food diary, count calories and to consider  joining a support group. Sample diet sheets offered. Goals set by the patient for the next several months.   Weight /BMI 07/30/2018 07/30/2018 07/22/2018  WEIGHT 236 lb 235 lb 12.8 oz 234 lb 9.6 oz  HEIGHT 5\' 3"  - 5\' 4"   BMI 41.81 kg/m2 40.47 kg/m2 40.27 kg/m2      Anxiety Currently controlled on regime, no change at this time

## 2018-07-30 NOTE — Assessment & Plan Note (Signed)
Hospital course reviewed and pt concerns addressed regarding her medication and management Improved and breathing comfortably on room air  Holding off of celebrex until she has completed her course of prednisone, and will resume afterward

## 2018-07-30 NOTE — Patient Instructions (Addendum)
F/U last week in March, call if you need me before   Thankful that you have the faith that you have, keep it!   NO celebrex when you are on prednisone  Thank you  for choosing [facility]. We consider it a privelige to serve you.  Delivering excellent health care in a caring and  compassionate way is our goal.  Partnering with you,  so that together we can achieve this goal is our strategy.

## 2018-07-30 NOTE — Assessment & Plan Note (Signed)
1.  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  She was evaluated by Dr. Roxan Hockey on 01/08/2018 and felt not to be a surgical candidate. - Started on chemoradiation therapy with weekly carboplatin and paclitaxel on 02/11/2018. - She had trouble with swallowing and retrosternal pain over the weekend.  [facility] mouthwash did not help.  Dr.Yanagihara gave her lidocaine 15 mL every 4 hours which is helping.  She is able to eat well now. - She completed chemotherapy on 03/19/2018 and radiation therapy on 03/25/2018.  She has recovered well from side effects of radiation and chemo. - I have discussed the results of the CT scan of the chest dated 04/30/2018 which showed central left upper lobe lung mass has decreased in size to 2.1 x 1.6 cm, from 3.4 x 3 cm.  There is also decreased extrinsic narrowing of the segmental left upper lobe bronchi.  Clustered satellite nodularity in the posterior left upper lobe has decreased.  No new areas were seen. -As she had partial response, durvalumab consolidation every 2 weeks for 12 months was recommended. - Durvalumab consolidation started on 05/07/2018, last dose on 05/21/2018.   - CT chest on 07/20/2018 shows pneumonitis of the left lung.  She was started on prednisone 1 mg/kg, rounded off 200 mg daily on 07/18/2018.   - Prednisone was increased to 200 mg on 07/22/2018 due to no response.  She was hospitalized from 07/22/2018  through 07/25/2018 and was treated with antibiotics and steroids. -She is currently taking Levaquin which she will finish on Friday.  She is tolerating prednisone 100 mg twice a day very well. - She is also receiving nebulizer treatments every 6 hours.  Her breathing has been better.  Today her sats are 95% on room air. - I will cut back on prednisone to 80 mg twice a day.  I will start her on Bactrim 1 tablet on Monday Wednesday and Friday for PCP prophylaxis. -I will reevaluate her in 10 days.  We plan to taper prednisone in the next 4 to 6 weeks.  2.  Family history: The 2 of her sisters died of stomach cancer.  Father died of lung cancer.  Maternal aunt died of thyroid cancer.  Maternal grandfather had cancer in the bone.  Primary is unknown.  We will refer her to our geneticist.  3.  Iron deficiency state: - She will continue iron tablet with stool softeners.  Last ferritin was 14.

## 2018-07-30 NOTE — Assessment & Plan Note (Signed)
The patient's Controlled Substance registry is reviewed and compliance confirmed. Adequacy of  Pain control and level of function is assessed. Medication dosing is adjusted as deemed appropriate. Twelve weeks of medication is prescribed , patient signs for the script and is provided with a follow up appointment between 11 to 12 weeks .  

## 2018-07-30 NOTE — Patient Instructions (Signed)
[facility] Discharge Instructions     Thank you for choosing [facility] at [facility] to provide your oncology and hematology care.  To afford each patient quality time with our provider, please arrive at least 15 minutes before your scheduled appointment time.   If you have a lab appointment with the Cancer Center please come in thru the  Main Entrance and check in at the main information desk  You need to re-schedule your appointment should you arrive 10 or more minutes late.  We strive to give you quality time with our providers, and arriving late affects you and other patients whose appointments are after yours.  Also, if you no show three or more times for appointments you may be dismissed from the clinic at the providers discretion.     Again, thank you for choosing [facility].  Our hope is that these requests will decrease the amount of time that you wait before being seen by our physicians.       _____________________________________________________________  Should you have questions after your visit to  Cancer Center, please contact our office at [phone] between the hours of 8:00 a.m. and 4:30 p.m.  Voicemails left after 4:00 p.m. will not be returned until the following business day.  For prescription refill requests, have your pharmacy contact our office and allow 72 hours.    Cancer Center Support Programs:   > Cancer Support Group  2nd Tuesday of the month 1pm-2pm, Journey Room    

## 2018-07-30 NOTE — Progress Notes (Signed)
[place], [name] [address]   CLINIC:  Medical Oncology/Hematology  PCP:  Fayrene Helper, MD [address], [address] [place] [address] [phone]   REASON FOR VISIT: Follow-up for squamous cell lung cancer AND Pneumonitis   CURRENT THERAPY: Imfinzi is on hold AND prednisone  BRIEF ONCOLOGIC HISTORY:    Squamous cell lung cancer, left ([facility])   10/28/2017 Imaging    CT scan of the chest shows left hilar mass measuring 3.4 x 2.9 cm, multiple soft tissue masses in the left upper lobe, largest measuring 1.6 cm  MRI of the brain on 10/31/2017 shows dural based focus of contrast enhancement along the superior left convexity, meningioma favored as it was present on MRI in 2009    10/30/2017 Initial Biopsy    Bronchoscopy and biopsy consistent with squamous cell cancer of the lung    11/08/2017 Family History    2 sisters died of stomach cancer Father died of lung cancer in a smoker Maternal aunt died of thyroid cancer Maternal grandfather had cancer spread to the bone    02/05/2018 - 03/25/2018 Chemotherapy    The patient had palonosetron (ALOXI) injection 0.25 mg, 0.25 mg, Intravenous,  Once, 6 of 6 cycles Administration: 0.25 mg (02/11/2018), 0.25 mg (02/18/2018), 0.25 mg (02/25/2018), 0.25 mg (03/04/2018), 0.25 mg (03/12/2018), 0.25 mg (03/19/2018) CARBOplatin (PARAPLATIN) 220 mg in sodium chloride 0.9 % 250 mL chemo infusion, 220 mg (100 % of original dose 215.8 mg), Intravenous,  Once, 6 of 6 cycles Dose modification:   (original dose 215.8 mg, Cycle 1),   (original dose 215.8 mg, Cycle 2), 215.8 mg (original dose 215.8 mg, Cycle 3),   (original dose 215.8 mg, Cycle 4) Administration: 220 mg (02/11/2018), 220 mg (02/18/2018), 220 mg (02/25/2018), 220 mg (03/04/2018), 220 mg (03/12/2018), 220 mg (03/19/2018) PACLitaxel (TAXOL) 96 mg in sodium chloride 0.9 % 250 mL chemo infusion (</= 6m/m2), 45 mg/m2 = 96 mg, Intravenous,  Once, 6 of 6  cycles Administration: 96 mg (02/11/2018), 96 mg (02/18/2018), 96 mg (02/25/2018), 96 mg (03/04/2018), 96 mg (03/12/2018), 96 mg (03/19/2018)  for chemotherapy treatment.      Malignant neoplasm of upper lobe of left lung ([facility]   01/22/2018 Initial Diagnosis    Malignant ne[place] of [place] lobe of left lung ([facility]    05/06/2018 -  Chemotherapy    The patient had durvalumab (IMFINZI) 1,000 mg in sodium chloride 0.9 % 100 mL chemo infusion, 10.2 mg/kg = 980 mg, Intravenous,  Once, 4 of 6 cycles Administration: 1,000 mg (05/07/2018), 1,000 mg (05/21/2018), 1,000 mg (06/19/2018), 1,000 mg (07/03/2018)  for chemotherapy treatment.       INTERVAL HISTORY:  Ms. [name]89y.o. female returns for routine follow-up for squamous cell lung cancer. She was discharged from the hospital on 12/13 and she is following up. She is here today with her husband and she is feeling better and her breathing has improved with the steroids. She is also taking breathing treatments at home and that has helped her as well. She still is easily fatigued and SOB with exertion but it is slowly improving as well. She denies any new pains. Denies any new cough or hemoptysis. Denies any fevers. Denies any nausea, vomiting, or diarrhea. She reports her appetite and energy level at 50% and she is maintaining her weight well.    REVIEW OF SYSTEMS:  Review of Systems  Respiratory: Positive for shortness of breath and wheezing.  All other systems reviewed and are negative.    PAST MEDICAL/SURGICAL HISTORY:  Past Medical History:  Diagnosis Date  . Anxiety   . Arthritis   . Bipolar disorder ([place])   . Chronic back pain   . COPD (chronic obstructive pulmonary disease) ([place])   . Family history of lung cancer   . Family history of prostate cancer   . Family history of stomach cancer   . 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 GERD (gastroesophageal reflux disease) 2000  . Hyperlipidemia 1995  .  Hypertension 1995  . Nicotine addiction   . Obesity    Past Surgical History:  Procedure Laterality Date  . BACK SURGERY  1999  . BRONCHIAL BRUSHINGS Left 10/30/2017   Procedure: BRONCHIAL BRUSHINGS;  Surgeon: [name], MD;  [facility];  Service: Cardiopulmonary;  Laterality: Left;  . BRONCHIAL WASHINGS Left 10/30/2017   Procedure: BRONCHIAL WASHINGS;  Surgeon: [name], MD;  Location: [facility];  Service: Cardiopulmonary;  Laterality: Left;  . CHOLECYSTECTOMY  2001  . COLONOSCOPY N/A 02/02/2016   Procedure: COLONOSCOPY;  Surgeon: [name], MD;  Location: [facility];  Service: Endoscopy;  Laterality: N/A;  930  . FLEXIBLE BRONCHOSCOPY Bilateral 10/30/2017   Procedure: FLEXIBLE BRONCHOSCOPY;  Surgeon: [name], MD;  Location: [facility];  Service: Cardiopulmonary;  Laterality: Bilateral;  . INCISIONAL HERNIA REPAIR  August 17, 2008  . left inguinal hernia herniorrhapy  1980  . PORTACATH PLACEMENT Left 02/03/2018   Procedure: INSERTION PORT-A-CATH;  Surgeon: [name], MD;  Location: [facility];  Service: General;  Laterality: Left;  . removal of thmoma  03/27/2010   Dr. Arlyce Dice   . SPINE SURGERY    . TONSILLECTOMY    . TOTAL ABDOMINAL HYSTERECTOMY W/ BILATERAL SALPINGOOPHORECTOMY  1984     SOCIAL HISTORY:  Social History   Socioeconomic History  . Marital status: Married    Spouse name: Not on file  . Number of children: Not on file  . Years of education: Not on file  . Highest education level: Not on file  Occupational History  . 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 Financial resource strain: Not hard at all  . Food insecurity:    Worry: Never true    Inability: Never true  . Transportation needs:    Medical: No    Non-medical: No  Tobacco Use  . Smoking status: Former Smoker    Packs/day: 1.50    Years: 54.00    Pack years: 81.00    Types: Cigarettes    Last attempt  to quit: 08/20/2017    Years since quitting: 0.9  . Smokeless tobacco: Never Used  Substance and Sexual Activity  . Alcohol use: No    Alcohol/week: 0.0 standard drinks  . Drug use: No  . Sexual activity: Not Currently  Lifestyle  . Physical activity:    Days per week: 0 days    Minutes per session: 0 min  . Stress: Only a little  Relationships  . Social connections:    Talks on phone: More than three times a week    Gets together: More than three times a week    Attends religious service: More than 4 times per year    Active member of club or organization: No    Attends meetings of clubs or organizations: Never  Relationship status: Married  . Intimate partner violence:    Fear of current or ex partner: No    Emotionally abused: No    Physically abused: No    Forced sexual activity: No  Other Topics Concern  . Not on file  Social History Narrative   Pt had 2 stillborns     FAMILY HISTORY:  Family History  Problem Relation Age of Onset  . Heart disease Mother        enlarged  . Diabetes Mother   . Hypertension Mother   . Cancer Father        throat cancer - smoker  . Hypertension Father   . Coronary artery disease Father   . Diabetes Sister        x3  . Asthma Sister   . Lung cancer Sister 47       d. 27; smoker  . Kidney disease Brother   . Stomach cancer Sister        dx in her 4s-50s  . Stroke Brother   . Prostate cancer Brother 20       pat 1/2 brother  . Thyroid cancer Maternal Aunt        dx and d. in her 46s  . Bone cancer Maternal Uncle   . Bone cancer Maternal Grandfather   . Cancer Cousin        NOS - had a swollen stomach    CURRENT MEDICATIONS:  Outpatient Encounter Medications as of 07/30/2018  Medication Sig  . albuterol (PROAIR HFA) 108 (90 Base) MCG/ACT inhaler INHALE 2 PUFFS BY MOUTH EVERY 6 HOURS IF NEEDED (Patient taking differently: Inhale 2 puffs into the lungs every 6 (six) hours as needed for wheezing or shortness of breath. )   . albuterol (PROVENTIL) (2.5 MG/3ML) 0.083% nebulizer solution USE 1 VIAL IN NEBULIZER EVERY 6 HOURS AS NEEDED.  Marland Kitchen ALPRAZolam (XANAX) 1 MG tablet Take 1 tablet (1 mg total) by mouth 2 (two) times daily.  Marland Kitchen aspirin (ASPIRIN LOW DOSE) 81 MG EC tablet Take 81 mg by mouth daily.    . Cholecalciferol (VITAMIN D3) 50 MCG (2000 UT) capsule Take 2,000 Units by mouth daily.  Marland Kitchen diltiazem (CARDIZEM CD) 120 MG 24 hr capsule Take 1 capsule (120 mg total) by mouth every morning.  . diphenhydrAMINE (BENADRYL) 25 mg capsule Take 25-50 mg by mouth at bedtime as needed for sleep.  Marland Kitchen docusate sodium (COLACE) 100 MG capsule Take 100-200 mg by mouth 2 (two) times daily as needed for mild constipation or moderate constipation.   Marland Kitchen EPINEPHrine 0.3 mg/0.3 mL IJ SOAJ injection Inject 0.3 mg into the muscle once.  Marland Kitchen FLUoxetine (PROZAC) 20 MG capsule Take 1 capsule (20 mg total) by mouth every morning.  . furosemide (LASIX) 20 MG tablet Take 2 tablets (40 mg total) by mouth every morning.  Marland Kitchen guaiFENesin (MUCINEX) 600 MG 12 hr tablet Take 1 tablet (600 mg total) by mouth 2 (two) times daily.  Marland Kitchen HYDROcodone-acetaminophen (NORCO/VICODIN) 5-325 MG tablet Take one tablet three times daily for back pain (Patient taking differently: Take 1 tablet by mouth 3 (three) times daily. For back pain)  . ipratropium (ATROVENT) 0.02 % nebulizer solution INHALE ONE VIAL VIA NEBULIZER EVERY 6 HOURS AS NEEDED.  Marland Kitchen lactulose (CHRONULAC) 10 GM/15ML solution Take 15-75m at bedtime every night to assist with moving bowels.  Titrate down if having multiple bowel movements.  .Marland Kitchenlevofloxacin (LEVAQUIN) 500 MG tablet Take 1 tablet (500 mg total) by mouth  daily for 7 days.  Marland Kitchen lidocaine-prilocaine (EMLA) cream Apply a quarter size amount to port site 1 hour prior to chemo. Do not rub in. Cover with plastic wrap.  . lisinopril (PRINIVIL,ZESTRIL) 10 MG tablet Take 1 tablet (10 mg total) by mouth every morning.  . nystatin (MYCOSTATIN) 100000 UNIT/ML  suspension Take 5 mLs (500,000 Units total) by mouth 4 (four) times daily for 5 days.  Marland Kitchen omeprazole (PRILOSEC) 40 MG capsule Take 1 capsule (40 mg total) by mouth every morning.  . potassium chloride SA (K-DUR,KLOR-CON) 20 MEQ tablet Take 1 tablet (20 mEq total) by mouth every morning.  . predniSONE (DELTASONE) 20 MG tablet Take 5 tablets (100 mg total) by mouth 2 (two) times daily with a meal for 7 days.  . prochlorperazine (COMPAZINE) 10 MG tablet Take 10 mg by mouth every 6 (six) hours as needed for nausea or vomiting.  . rosuvastatin (CRESTOR) 20 MG tablet Take 1 tablet (20 mg total) by mouth at bedtime.  . SYMBICORT 80-4.5 MCG/ACT inhaler INHALE 2 PUFFS BY MOUTH TWICE DAILY( RINSE MOUTH AFTER USE)  . tiZANidine (ZANAFLEX) 4 MG tablet TAKE 1 TABLET(4 MG) BY MOUTH THREE TIMES DAILY (Patient taking differently: Take 4 mg by mouth 3 (three) times daily. )  . predniSONE (DELTASONE) 20 MG tablet Take 4 tablets (80 mg total) by mouth 2 (two) times daily with a meal.  . sulfamethoxazole-trimethoprim (BACTRIM DS,SEPTRA DS) 800-160 MG tablet Take 1 tablet by mouth 3 (three) times a week.   No facility-administered encounter medications on file as of 07/30/2018.     ALLERGIES:  Allergies  Allergen Reactions  . Bee Venom Anaphylaxis  . Penicillins Itching    Blisters in mouth after dental work Has patient had a PCN reaction causing immediate rash, facial/tongue/throat swelling, SOB or lightheadedness with hypotension: no Has patient had a PCN reaction causing severe rash involving mucus membranes or skin necrosis: No Has patient had a PCN reaction that required hospitalization No Has patient had a PCN reaction occurring within the last 10 years: Yes If all of the above answers are "NO", then may proceed with Cephalosporin use.   . Codeine      PHYSICAL EXAM:  ECOG Performance status: 1  Vitals:   07/30/18 0913  BP: (!) 163/66  Pulse: 76  Resp: 20  Temp: 98.8 F (37.1 C)  SpO2: 95%    Filed Weights   07/30/18 0913  Weight: 235 lb 12.8 oz (107 kg)    Physical Exam Constitutional:      Appearance: Normal appearance. She is obese.  Cardiovascular:     Rate and Rhythm: Normal rate and regular rhythm.     Heart sounds: Normal heart sounds.  Pulmonary:     Effort: Pulmonary effort is normal.     Breath sounds: Wheezing present.  Musculoskeletal: Normal range of motion.  Skin:    General: Skin is warm and dry.  Neurological:     Mental Status: She is alert and oriented to person, place, and time. Mental status is at baseline.  Psychiatric:        Mood and Affect: Mood normal.        Behavior: Behavior normal.        Thought Content: Thought content normal.        Judgment: Judgment normal.      LABORATORY DATA:  I have reviewed the labs as listed.  CBC    Component Value Date/Time   WBC 11.6 (H) 07/30/2018 1020  RBC 3.97 07/30/2018 1020   HGB 10.6 (L) 07/30/2018 1020   HCT 36.0 07/30/2018 1020   PLT 234 07/30/2018 1020   MCV 90.7 07/30/2018 1020   MCH 26.7 07/30/2018 1020   MCHC 29.4 (L) 07/30/2018 1020   RDW 16.1 (H) 07/30/2018 1020   LYMPHSABS 0.4 (L) 07/30/2018 1020   MONOABS 0.7 07/30/2018 1020   EOSABS 0.0 07/30/2018 1020   BASOSABS 0.0 07/30/2018 1020   CMP Latest Ref Rng & Units 07/30/2018 07/23/2018 07/22/2018  Glucose 70 - 99 mg/dL 338(H) 306(H) 307(H)  BUN 8 - 23 mg/dL 25(H) 19 18  Creatinine 0.44 - 1.00 mg/dL 0.88 0.88 0.95  Sodium 135 - 145 mmol/L 137 140 142  Potassium 3.5 - 5.1 mmol/L 3.6 4.0 3.3(L)  Chloride 98 - 111 mmol/L 90(L) 103 107  CO2 22 - 32 mmol/L 34(H) 28 26  Calcium 8.9 - 10.3 mg/dL 8.9 8.5(L) 8.3(L)  Total Protein 6.5 - 8.1 g/dL 6.0(L) - 6.1(L)  Total Bilirubin 0.3 - 1.2 mg/dL 0.7 - 0.4  Alkaline Phos 38 - 126 U/L 90 - 74  AST 15 - 41 U/L 19 - 46(H)  ALT 0 - 44 U/L 24 - 36       DIAGNOSTIC IMAGING:  I have independently reviewed the scans and discussed with the patient.   I have reviewed Francene Finders,  NP's note and agree with the documentation.  I personally performed a face-to-face visit, made revisions and my assessment and plan is as follows.    ASSESSMENT & PLAN:   Squamous cell lung cancer, left ([facility]) 1.  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  She was evaluated by Dr. Roxan Hockey on 01/08/2018 and felt not to be a surgical candidate. - Started on chemoradiation therapy with weekly carboplatin and paclitaxel on 02/11/2018. - She had trouble with swallowing and retrosternal pain over the weekend.  [facility] mouthwash did not help.  Dr.Yanagihara gave her lidocaine 15 mL every 4 hours which is helping.  She is able to eat well now. - She completed chemotherapy on 03/19/2018 and radiation therapy on 03/25/2018.  She has recovered well from side effects of radiation and chemo. - I have discussed the results of the CT scan of the chest dated 04/30/2018 which showed central left upper lobe lung mass has decreased in size to 2.1 x 1.6 cm, from 3.4 x 3 cm.  There is also decreased extrinsic narrowing of the segmental left upper lobe bronchi.  Clustered satellite nodularity in the posterior left upper lobe has decreased.  No new areas were seen. -As she had partial response, durvalumab consolidation every 2 weeks for 12 months was recommended. - Durvalumab consolidation started on 05/07/2018, last dose on 05/21/2018.   - CT chest on 07/20/2018 shows  pneumonitis of the left lung.  She was started on prednisone 1 mg/kg, rounded off 200 mg daily on 07/18/2018.   - Prednisone was increased to 200 mg on 07/22/2018 due to no response.  She was hospitalized from 07/22/2018 through 07/25/2018 and was treated with antibiotics and steroids. -She is currently taking Levaquin which she will finish on Friday.  She is tolerating prednisone 100 mg twice a day very well. - She is also receiving nebulizer treatments every 6 hours.  Her breathing has been better.  Today her sats are 95% on room air. - I will cut back on prednisone to 80 mg twice a day.  I will start her on Bactrim 1 tablet on Monday Wednesday and Friday for PCP prophylaxis. -I will reevaluate her in 10 days.  We plan to taper prednisone in the next 4 to 6 weeks.  2.  Family history: The 2 of her sisters died of stomach cancer.  Father died of lung cancer.  Maternal aunt died of thyroid cancer.  Maternal grandfather had cancer in the bone.  Primary is unknown.  We will refer her to our geneticist.  3.  Iron deficiency state: - She will continue iron tablet with stool softeners.  Last ferritin was 14.       Orders placed this encounter:  Orders Placed This Encounter  Procedures  . CBC with Differential/Platelet  . CBC with Differential/Platelet  . Comprehensive metabolic panel  . CBC with Differential/Platelet  . Comprehensive metabolic panel      Derek Jack, MD [place] [phone]

## 2018-07-31 ENCOUNTER — Ambulatory Visit (HOSPITAL_COMMUNITY): Payer: Self-pay | Admitting: Hematology

## 2018-07-31 ENCOUNTER — Other Ambulatory Visit (HOSPITAL_COMMUNITY): Payer: Self-pay

## 2018-07-31 ENCOUNTER — Ambulatory Visit (HOSPITAL_COMMUNITY): Payer: Self-pay

## 2018-08-02 ENCOUNTER — Other Ambulatory Visit: Payer: Self-pay | Admitting: Family Medicine

## 2018-08-02 ENCOUNTER — Encounter: Payer: Self-pay | Admitting: Family Medicine

## 2018-08-05 ENCOUNTER — Ambulatory Visit (HOSPITAL_COMMUNITY): Payer: Self-pay

## 2018-08-05 ENCOUNTER — Other Ambulatory Visit (HOSPITAL_COMMUNITY): Payer: Self-pay

## 2018-08-08 ENCOUNTER — Encounter (HOSPITAL_COMMUNITY): Payer: Self-pay

## 2018-08-08 ENCOUNTER — Other Ambulatory Visit: Payer: Self-pay

## 2018-08-08 ENCOUNTER — Observation Stay (HOSPITAL_COMMUNITY)
Admission: EM | Admit: 2018-08-08 | Discharge: 2018-08-11 | Disposition: A | Discharge status: Home / Self Care | Payer: PPO | Attending: Internal Medicine | Admitting: Internal Medicine

## 2018-08-08 ENCOUNTER — Emergency Department (HOSPITAL_COMMUNITY): Payer: PPO

## 2018-08-08 ENCOUNTER — Inpatient Hospital Stay (HOSPITAL_COMMUNITY): Payer: PPO

## 2018-08-08 DIAGNOSIS — Z885 Allergy status to narcotic agent status: Secondary | ICD-10-CM | POA: Insufficient documentation

## 2018-08-08 DIAGNOSIS — Z6841 Body Mass Index (BMI) 40.0 and over, adult: Secondary | ICD-10-CM | POA: Diagnosis not present

## 2018-08-08 DIAGNOSIS — Z880 Allergy status to penicillin: Secondary | ICD-10-CM | POA: Insufficient documentation

## 2018-08-08 DIAGNOSIS — D649 Anemia, unspecified: Secondary | ICD-10-CM | POA: Insufficient documentation

## 2018-08-08 DIAGNOSIS — R739 Hyperglycemia, unspecified: Secondary | ICD-10-CM | POA: Diagnosis not present

## 2018-08-08 DIAGNOSIS — G8929 Other chronic pain: Secondary | ICD-10-CM | POA: Diagnosis present

## 2018-08-08 DIAGNOSIS — E1165 Type 2 diabetes mellitus with hyperglycemia: Principal | ICD-10-CM | POA: Insufficient documentation

## 2018-08-08 DIAGNOSIS — E785 Hyperlipidemia, unspecified: Secondary | ICD-10-CM | POA: Diagnosis not present

## 2018-08-08 DIAGNOSIS — C3492 Malignant neoplasm of unspecified part of left bronchus or lung: Secondary | ICD-10-CM | POA: Diagnosis present

## 2018-08-08 DIAGNOSIS — I10 Essential (primary) hypertension: Secondary | ICD-10-CM | POA: Diagnosis not present

## 2018-08-08 DIAGNOSIS — Z7952 Long term (current) use of systemic steroids: Secondary | ICD-10-CM | POA: Diagnosis not present

## 2018-08-08 DIAGNOSIS — C3432 Malignant neoplasm of lower lobe, left bronchus or lung: Secondary | ICD-10-CM | POA: Diagnosis not present

## 2018-08-08 DIAGNOSIS — F419 Anxiety disorder, unspecified: Secondary | ICD-10-CM | POA: Diagnosis present

## 2018-08-08 DIAGNOSIS — M1990 Unspecified osteoarthritis, unspecified site: Secondary | ICD-10-CM | POA: Diagnosis not present

## 2018-08-08 DIAGNOSIS — F319 Bipolar disorder, unspecified: Secondary | ICD-10-CM | POA: Insufficient documentation

## 2018-08-08 DIAGNOSIS — K219 Gastro-esophageal reflux disease without esophagitis: Secondary | ICD-10-CM | POA: Diagnosis not present

## 2018-08-08 DIAGNOSIS — Z7982 Long term (current) use of aspirin: Secondary | ICD-10-CM | POA: Insufficient documentation

## 2018-08-08 DIAGNOSIS — Z87891 Personal history of nicotine dependence: Secondary | ICD-10-CM | POA: Insufficient documentation

## 2018-08-08 DIAGNOSIS — Z7951 Long term (current) use of inhaled steroids: Secondary | ICD-10-CM | POA: Insufficient documentation

## 2018-08-08 DIAGNOSIS — C3412 Malignant neoplasm of upper lobe, left bronchus or lung: Secondary | ICD-10-CM | POA: Diagnosis not present

## 2018-08-08 DIAGNOSIS — M549 Dorsalgia, unspecified: Secondary | ICD-10-CM | POA: Insufficient documentation

## 2018-08-08 DIAGNOSIS — J449 Chronic obstructive pulmonary disease, unspecified: Secondary | ICD-10-CM | POA: Insufficient documentation

## 2018-08-08 DIAGNOSIS — D696 Thrombocytopenia, unspecified: Secondary | ICD-10-CM

## 2018-08-08 DIAGNOSIS — D539 Nutritional anemia, unspecified: Secondary | ICD-10-CM | POA: Diagnosis present

## 2018-08-08 DIAGNOSIS — D72829 Elevated white blood cell count, unspecified: Secondary | ICD-10-CM

## 2018-08-08 LAB — CBC WITH DIFFERENTIAL/PLATELET
Abs Immature Granulocytes: 0.09 10*3/uL — ABNORMAL HIGH (ref 0.00–0.07)
Abs Immature Granulocytes: 0.07 10*3/uL (ref 0.00–0.07)
Basophils Absolute: 0 10*3/uL (ref 0.0–0.1)
Basophils Absolute: 0 10*3/uL (ref 0.0–0.1)
Basophils Relative: 0 %
Basophils Relative: 0 %
Eosinophils Absolute: 0 10*3/uL (ref 0.0–0.5)
Eosinophils Absolute: 0 10*3/uL (ref 0.0–0.5)
Eosinophils Relative: 0 %
Eosinophils Relative: 0 %
HCT: 41.4 % (ref 36.0–46.0)
HEMATOCRIT: 39.3 % (ref 36.0–46.0)
HEMOGLOBIN: 11.8 g/dL — AB (ref 12.0–15.0)
Hemoglobin: 12.7 g/dL (ref 12.0–15.0)
IMMATURE GRANULOCYTES: 1 %
Immature Granulocytes: 1 %
LYMPHS PCT: 4 %
Lymphocytes Relative: 2 %
Lymphs Abs: 0.4 10*3/uL — ABNORMAL LOW (ref 0.7–4.0)
Lymphs Abs: 0.3 10*3/uL — ABNORMAL LOW (ref 0.7–4.0)
MCH: 26.9 pg (ref 26.0–34.0)
MCH: 27.2 pg (ref 26.0–34.0)
MCHC: 30 g/dL (ref 30.0–36.0)
MCHC: 30.7 g/dL (ref 30.0–36.0)
MCV: 89.5 fL (ref 80.0–100.0)
MCV: 88.7 fL (ref 80.0–100.0)
Monocytes Absolute: 0.4 10*3/uL (ref 0.1–1.0)
Monocytes Absolute: 0.3 10*3/uL (ref 0.1–1.0)
Monocytes Relative: 4 %
Monocytes Relative: 2 %
NEUTROS PCT: 95 %
Neutro Abs: 9.6 10*3/uL — ABNORMAL HIGH (ref 1.7–7.7)
Neutro Abs: 10.5 10*3/uL — ABNORMAL HIGH (ref 1.7–7.7)
Neutrophils Relative %: 91 %
Platelets: 106 10*3/uL — ABNORMAL LOW (ref 150–400)
Platelets: 133 10*3/uL — ABNORMAL LOW (ref 150–400)
RBC: 4.39 MIL/uL (ref 3.87–5.11)
RBC: 4.67 MIL/uL (ref 3.87–5.11)
RDW: 15.9 % — ABNORMAL HIGH (ref 11.5–15.5)
RDW: 15.9 % — ABNORMAL HIGH (ref 11.5–15.5)
WBC: 10.4 10*3/uL (ref 4.0–10.5)
WBC: 11 10*3/uL — AB (ref 4.0–10.5)
nRBC: 0 % (ref 0.0–0.2)
nRBC: 0 % (ref 0.0–0.2)

## 2018-08-08 LAB — GLUCOSE, CAPILLARY
Glucose-Capillary: 218 mg/dL — ABNORMAL HIGH (ref 70–99)
Glucose-Capillary: 238 mg/dL — ABNORMAL HIGH (ref 70–99)
Glucose-Capillary: 248 mg/dL — ABNORMAL HIGH (ref 70–99)
Glucose-Capillary: 256 mg/dL — ABNORMAL HIGH (ref 70–99)
Glucose-Capillary: 289 mg/dL — ABNORMAL HIGH (ref 70–99)
Glucose-Capillary: 305 mg/dL — ABNORMAL HIGH (ref 70–99)
Glucose-Capillary: 323 mg/dL — ABNORMAL HIGH (ref 70–99)
Glucose-Capillary: 359 mg/dL — ABNORMAL HIGH (ref 70–99)

## 2018-08-08 LAB — CBG MONITORING, ED
GLUCOSE-CAPILLARY: 593 mg/dL — AB (ref 70–99)
Glucose-Capillary: >600 mg/dL (ref 70–99)
Glucose-Capillary: 453 mg/dL — ABNORMAL HIGH (ref 70–99)
Glucose-Capillary: 375 mg/dL — ABNORMAL HIGH (ref 70–99)

## 2018-08-08 LAB — BASIC METABOLIC PANEL
ANION GAP: 14 (ref 5–15)
BUN: 31 mg/dL — ABNORMAL HIGH (ref 8–23)
CO2: 27 mmol/L (ref 22–32)
Calcium: 9.3 mg/dL (ref 8.9–10.3)
Chloride: 89 mmol/L — ABNORMAL LOW (ref 98–111)
Creatinine, Ser: 1.06 mg/dL — ABNORMAL HIGH (ref 0.44–1.00)
GFR calc Af Amer: >60 mL/min (ref >60)
GFR calc non Af Amer: 53 mL/min — ABNORMAL LOW (ref >60)
Glucose, Bld: 659 mg/dL (ref 70–99)
Potassium: 4.7 mmol/L (ref 3.5–5.1)
Sodium: 130 mmol/L — ABNORMAL LOW (ref 135–145)

## 2018-08-08 LAB — COMPREHENSIVE METABOLIC PANEL
ALT: 24 U/L (ref 0–44)
AST: 18 U/L (ref 15–41)
Albumin: 3.5 g/dL (ref 3.5–5.0)
Alkaline Phosphatase: 100 U/L (ref 38–126)
Anion gap: 14 (ref 5–15)
BILIRUBIN TOTAL: 0.7 mg/dL (ref 0.3–1.2)
BUN: 32 mg/dL — ABNORMAL HIGH (ref 8–23)
CALCIUM: 9.3 mg/dL (ref 8.9–10.3)
CO2: 25 mmol/L (ref 22–32)
Chloride: 89 mmol/L — ABNORMAL LOW (ref 98–111)
Creatinine, Ser: 1.25 mg/dL — ABNORMAL HIGH (ref 0.44–1.00)
GFR calc Af Amer: 50 mL/min — ABNORMAL LOW (ref >60)
GFR calc non Af Amer: 44 mL/min — ABNORMAL LOW (ref >60)
Glucose, Bld: 667 mg/dL (ref 70–99)
Potassium: 5.7 mmol/L — ABNORMAL HIGH (ref 3.5–5.1)
Sodium: 128 mmol/L — ABNORMAL LOW (ref 135–145)
Total Protein: 5.9 g/dL — ABNORMAL LOW (ref 6.5–8.1)

## 2018-08-08 LAB — POTASSIUM: Potassium: 4.2 mmol/L (ref 3.5–5.1)

## 2018-08-08 LAB — MRSA PCR SCREENING: MRSA by PCR: NEGATIVE

## 2018-08-08 MED ORDER — PREDNISONE 20 MG PO TABS
80.0000 mg | ORAL_TABLET | Freq: Two times a day (BID) | ORAL | Status: DC
  Administered 2018-08-08 – 2018-08-11 (×6): 80 mg via ORAL
  Filled 2018-08-08 (×6): qty 4

## 2018-08-08 MED ORDER — SODIUM CHLORIDE 0.9 % IV BOLUS
500.0000 mL | Freq: Once | INTRAVENOUS | Status: AC
  Administered 2018-08-08: 500 mL via INTRAVENOUS

## 2018-08-08 MED ORDER — DOCUSATE SODIUM 100 MG PO CAPS
100.0000 mg | ORAL_CAPSULE | Freq: Two times a day (BID) | ORAL | Status: DC | PRN
  Administered 2018-08-09 – 2018-08-11 (×3): 100 mg via ORAL
  Filled 2018-08-08 (×3): qty 1

## 2018-08-08 MED ORDER — HYDROCODONE-ACETAMINOPHEN 5-325 MG PO TABS
1.0000 | ORAL_TABLET | Freq: Four times a day (QID) | ORAL | Status: DC | PRN
  Administered 2018-08-08: 1 via ORAL
  Administered 2018-08-09 (×2): 2 via ORAL
  Administered 2018-08-09: 1 via ORAL
  Administered 2018-08-10 (×2): 2 via ORAL
  Administered 2018-08-10: 1 via ORAL
  Administered 2018-08-10 – 2018-08-11 (×2): 2 via ORAL
  Filled 2018-08-08: qty 2
  Filled 2018-08-08: qty 1
  Filled 2018-08-08: qty 2
  Filled 2018-08-08: qty 1
  Filled 2018-08-08 (×4): qty 2

## 2018-08-08 MED ORDER — IPRATROPIUM-ALBUTEROL 0.5-2.5 (3) MG/3ML IN SOLN
3.0000 mL | Freq: Four times a day (QID) | RESPIRATORY_TRACT | Status: DC | PRN
  Administered 2018-08-08 – 2018-08-10 (×4): 3 mL via RESPIRATORY_TRACT
  Filled 2018-08-08 (×4): qty 3

## 2018-08-08 MED ORDER — GUAIFENESIN ER 600 MG PO TB12
600.0000 mg | ORAL_TABLET | Freq: Two times a day (BID) | ORAL | Status: DC
  Administered 2018-08-08 – 2018-08-11 (×6): 600 mg via ORAL
  Filled 2018-08-08 (×6): qty 1

## 2018-08-08 MED ORDER — PANTOPRAZOLE SODIUM 40 MG PO TBEC
40.0000 mg | DELAYED_RELEASE_TABLET | Freq: Every day | ORAL | Status: DC
  Administered 2018-08-08 – 2018-08-11 (×4): 40 mg via ORAL
  Filled 2018-08-08 (×4): qty 1

## 2018-08-08 MED ORDER — HYDROCODONE-ACETAMINOPHEN 5-325 MG PO TABS
1.0000 | ORAL_TABLET | Freq: Once | ORAL | Status: AC
  Administered 2018-08-08: 1 via ORAL
  Filled 2018-08-08: qty 1

## 2018-08-08 MED ORDER — SODIUM CHLORIDE 0.9 % IV SOLN: INTRAVENOUS | Status: DC

## 2018-08-08 MED ORDER — ROSUVASTATIN CALCIUM 20 MG PO TABS
20.0000 mg | ORAL_TABLET | Freq: Every day | ORAL | Status: DC
  Administered 2018-08-08 – 2018-08-10 (×3): 20 mg via ORAL
  Filled 2018-08-08 (×3): qty 1

## 2018-08-08 MED ORDER — TIZANIDINE HCL 2 MG PO TABS
4.0000 mg | ORAL_TABLET | Freq: Three times a day (TID) | ORAL | Status: DC
  Administered 2018-08-08 – 2018-08-11 (×9): 4 mg via ORAL
  Filled 2018-08-08: qty 2
  Filled 2018-08-08 (×2): qty 1
  Filled 2018-08-08: qty 2
  Filled 2018-08-08: qty 1
  Filled 2018-08-08: qty 2
  Filled 2018-08-08: qty 1
  Filled 2018-08-08 (×2): qty 2
  Filled 2018-08-08: qty 1
  Filled 2018-08-08: qty 2
  Filled 2018-08-08: qty 1
  Filled 2018-08-08: qty 2
  Filled 2018-08-08 (×3): qty 1
  Filled 2018-08-08: qty 2

## 2018-08-08 MED ORDER — INSULIN REGULAR(HUMAN) IN NACL 100-0.9 UT/100ML-% IV SOLN
INTRAVENOUS | Status: DC
  Administered 2018-08-08: 5.3 [IU]/h via INTRAVENOUS
  Filled 2018-08-08 (×2): qty 100

## 2018-08-08 MED ORDER — PROCHLORPERAZINE MALEATE 5 MG PO TABS: 10.0000 mg | ORAL_TABLET | Freq: Four times a day (QID) | ORAL | Status: DC | PRN

## 2018-08-08 MED ORDER — FLUOXETINE HCL 20 MG PO CAPS
20.0000 mg | ORAL_CAPSULE | Freq: Every morning | ORAL | Status: DC
  Administered 2018-08-09 – 2018-08-11 (×3): 20 mg via ORAL
  Filled 2018-08-08 (×3): qty 1

## 2018-08-08 MED ORDER — POTASSIUM CHLORIDE CRYS ER 20 MEQ PO TBCR: 20.0000 meq | EXTENDED_RELEASE_TABLET | Freq: Once | ORAL | Status: DC

## 2018-08-08 MED ORDER — DILTIAZEM HCL ER COATED BEADS 120 MG PO CP24
120.0000 mg | ORAL_CAPSULE | Freq: Every morning | ORAL | Status: DC
  Administered 2018-08-09 – 2018-08-11 (×3): 120 mg via ORAL
  Filled 2018-08-08 (×3): qty 1

## 2018-08-08 MED ORDER — ASPIRIN EC 81 MG PO TBEC
81.0000 mg | DELAYED_RELEASE_TABLET | Freq: Every day | ORAL | Status: DC
  Administered 2018-08-09 – 2018-08-11 (×3): 81 mg via ORAL
  Filled 2018-08-08 (×3): qty 1

## 2018-08-08 MED ORDER — DEXTROSE-NACL 5-0.45 % IV SOLN
INTRAVENOUS | Status: DC
  Administered 2018-08-08: 20:00:00 via INTRAVENOUS

## 2018-08-08 MED ORDER — MOMETASONE FURO-FORMOTEROL FUM 100-5 MCG/ACT IN AERO
2.0000 | INHALATION_SPRAY | Freq: Two times a day (BID) | RESPIRATORY_TRACT | Status: DC
  Administered 2018-08-08 – 2018-08-11 (×6): 2 via RESPIRATORY_TRACT
  Filled 2018-08-08: qty 8.8

## 2018-08-08 MED ORDER — VITAMIN D 25 MCG (1000 UNIT) PO TABS
2000.0000 [IU] | ORAL_TABLET | Freq: Every day | ORAL | Status: DC
  Administered 2018-08-08 – 2018-08-11 (×4): 2000 [IU] via ORAL
  Filled 2018-08-08 (×4): qty 2

## 2018-08-08 MED ORDER — ALPRAZOLAM 0.5 MG PO TABS
1.0000 mg | ORAL_TABLET | Freq: Two times a day (BID) | ORAL | Status: DC | PRN
  Administered 2018-08-08 – 2018-08-10 (×5): 1 mg via ORAL
  Filled 2018-08-08 (×5): qty 2

## 2018-08-08 MED ORDER — SODIUM CHLORIDE 0.9 % IV SOLN
INTRAVENOUS | Status: DC
  Administered 2018-08-08: 14:00:00 via INTRAVENOUS

## 2018-08-08 MED ORDER — DIPHENHYDRAMINE HCL 25 MG PO CAPS: 25.0000 mg | ORAL_CAPSULE | Freq: Every evening | ORAL | Status: DC | PRN

## 2018-08-08 NOTE — ED Triage Notes (Signed)
Pt sent from cancer center receiving immunotherapy . Pt has been on steriods for couple of months and cbg was checked today with Blood sugar over 600. Pt reports increase thirst, urination and hunger

## 2018-08-08 NOTE — Progress Notes (Unsigned)
CRITICAL VALUE ALERT  Critical Value:  Blood glucose 667  Date & Time Notied:  08/08/18 at 0925  Provider Notified: Dr. Walden Field  Orders Received/Actions taken: pt contacted via telephone and  instructed to go to the ED.

## 2018-08-08 NOTE — Progress Notes (Signed)
Reviewed oral potassium order with mid-level, [place].  K+ level at 2042 was 4.2.  Orders given to hold oral potassium for now.

## 2018-08-08 NOTE — H&P (Addendum)
History and Physical    EMORY LEAVER IRS:[id number] DOB: 02-23-1946 DOA: 08/08/2018  PCP: Fayrene Helper, MD Patient coming from: Home  Chief Complaint: Sent here from the cancer center for blood sugar over 600  HPI: [place] Quigley is a 72 y.o. 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  Her blood glucose was over 600 over there.  Patient does not have a history of diabetes.  States she is on immune therapy and prednisone for her lung cancer.  She has been taking high-dose prednisone for the past 2 to 3 months with plan to taper gradually over time.  Reports having polyphagia, polydipsia, and polyuria.  Denies having any dysuria, fevers, or chills.  States she has been eating a lot of cake and ice cream.   ED Course: Hemodynamically stable.  Blood glucose 659.  Potassium 4.7.  Bicarb 27 and anion gap 14.  Chest x-ray without evidence of active cardiopulmonary disease.  Patient was started on glucose stabilizer protocol in the ED.  Review of Systems: As per HPI otherwise 10 point review of systems negative.  Past Medical History:  Diagnosis Date  . Anxiety   . Arthritis   . Bipolar disorder ([place])   . Chronic back pain   . COPD (chronic obstructive pulmonary disease) ([place])   . Family history of lung cancer   . Family history of prostate cancer   . Family history of stomach cancer   . 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 GERD (gastroesophageal reflux disease) 2000  . Hyperlipidemia 1995  . Hypertension 1995  . Nicotine addiction   . Obesity     Past Surgical History:  Procedure Laterality Date  . BACK SURGERY  1999  . BRONCHIAL BRUSHINGS Left 10/30/2017   Procedure: BRONCHIAL BRUSHINGS;  Surgeon: [name], MD;  Location: [facility];  Service: Cardiopulmonary;  Laterality: Left;  . BRONCHIAL WASHINGS Left 10/30/2017   Procedure: BRONCHIAL WASHINGS;  Surgeon: [name], MD;  Location: [facility];  Service: Cardiopulmonary;  Laterality: Left;  . CHOLECYSTECTOMY  2001  . COLONOSCOPY N/A 02/02/2016   Procedure: COLONOSCOPY;  Surgeon: [name], MD;  Location: [facility];  Service: Endoscopy;  Laterality: N/A;  930  . FLEXIBLE BRONCHOSCOPY Bilateral 10/30/2017   Procedure: FLEXIBLE BRONCHOSCOPY;  Surgeon: [name], MD;  Location: [facility];  Service: Cardiopulmonary;  Laterality: Bilateral;  . INCISIONAL HERNIA REPAIR  August 17, 2008  . left inguinal hernia herniorrhapy  1980  . PORTACATH PLACEMENT Left 02/03/2018   Procedure: INSERTION PORT-A-CATH;  Surgeon: [name], MD;  Location: [facility];  Service: General;  Laterality: Left;  . removal of thmoma  03/27/2010   Dr. Arlyce Dice   . SPINE SURGERY    . TONSILLECTOMY    . TOTAL ABDOMINAL HYSTERECTOMY W/ BILATERAL SALPINGOOPHORECTOMY  1984     reports that she quit smoking about a year ago. Her smoking use included cigarettes. She has a 81.00 pack-year smoking history. She has never used smokeless tobacco. She reports that she does not drink alcohol or use drugs.  Allergies  Allergen Reactions  . Bee Venom Anaphylaxis  . Penicillins Itching    Blisters in mouth after dental work Has patient had a PCN reaction causing immediate rash, facial/tongue/throat swelling,  SOB or lightheadedness with hypotension: no Has patient had a PCN reaction causing severe rash involving mucus membranes or skin necrosis: No Has patient had a PCN reaction that required hospitalization No Has patient had a PCN reaction occurring within the last 10 years: Yes If all of the above answers are "NO", then may proceed with Cephalosporin use.   . Codeine     Family History  Problem Relation Age of Onset  . Heart disease Mother        enlarged  . Diabetes  Mother   . Hypertension Mother   . Cancer Father        throat cancer - smoker  . Hypertension Father   . Coronary artery disease Father   . Diabetes Sister        x3  . Asthma Sister   . Lung cancer Sister 21       d. 79; smoker  . Kidney disease Brother   . Stomach cancer Sister        dx in her 33s-50s  . Stroke Brother   . Prostate cancer Brother 67       pat 1/2 brother  . Thyroid cancer Maternal Aunt        dx and d. in her 21s  . Bone cancer Maternal Uncle   . Bone cancer Maternal Grandfather   . Cancer Cousin        NOS - had a swollen stomach    Prior to Admission medications   Medication Sig Start Date End Date Taking? Authorizing Provider  albuterol (PROAIR HFA) 108 (90 Base) MCG/ACT inhaler INHALE 2 PUFFS BY MOUTH EVERY 6 HOURS IF NEEDED Patient taking differently: Inhale 2 puffs into the lungs every 6 (six) hours as needed for wheezing or shortness of breath.  03/18/18  Yes Fayrene Helper, MD  albuterol (PROVENTIL) (2.5 MG/3ML) 0.083% nebulizer solution USE 1 VIAL IN NEBULIZER EVERY 6 HOURS AS NEEDED. 07/25/18  Yes Fayrene Helper, MD  ALPRAZolam Duanne Moron) 1 MG tablet Take 1 tablet (1 mg total) by mouth 2 (two) times daily as needed for anxiety. 08/08/18 08/08/19 Yes Fayrene Helper, MD  aspirin (ASPIRIN LOW DOSE) 81 MG EC tablet Take 81 mg by mouth daily.     Yes [provider]  Cholecalciferol (VITAMIN D3) 50 MCG (2000 UT) capsule Take 2,000 Units by mouth daily.   Yes [provider]  diltiazem (CARDIZEM CD) 120 MG 24 hr capsule Take 1 capsule (120 mg total) by mouth every morning. 07/25/18  Yes Johnson, Clanford L, MD  diphenhydrAMINE (BENADRYL) 25 mg capsule Take 25-50 mg by mouth at bedtime as needed for sleep.   Yes [provider]  docusate sodium (COLACE) 100 MG capsule Take 100-200 mg by mouth 2 (two) times daily as needed for mild constipation or moderate constipation.    Yes [provider]  EPINEPHrine 0.3  mg/0.3 mL IJ SOAJ injection Inject 0.3 mg into the muscle once.   Yes [provider]  FLUoxetine (PROZAC) 20 MG capsule Take 1 capsule (20 mg total) by mouth every morning. 07/25/18  Yes Johnson, Clanford L, MD  furosemide (LASIX) 20 MG tablet Take 2 tablets (40 mg total) by mouth every morning. 07/28/18  Yes Johnson, Clanford L, MD  guaiFENesin (MUCINEX) 600 MG 12 hr tablet Take 1 tablet (600 mg total) by mouth 2 (two) times daily. 11/01/17  Yes Kathie Dike, MD  HYDROcodone-acetaminophen (NORCO/VICODIN) 5-325 MG tablet Take one tablet three times daily  for back pain 08/08/18 09/07/18 Yes Fayrene Helper, MD  ipratropium (ATROVENT) 0.02 % nebulizer solution INHALE ONE VIAL VIA NEBULIZER EVERY 6 HOURS AS NEEDED. 07/25/18  Yes Fayrene Helper, MD  lactulose (CHRONULAC) 10 GM/15ML solution Take 15-20m at bedtime every night to assist with moving bowels.  Titrate down if having multiple bowel movements. 03/05/18  Yes KDerek Jack MD  lidocaine-prilocaine (EMLA) cream Apply a quarter size amount to port site 1 hour prior to chemo. Do not rub in. Cover with plastic wrap. 01/22/18  Yes KDerek Jack MD  lisinopril (PRINIVIL,ZESTRIL) 10 MG tablet Take 1 tablet (10 mg total) by mouth every morning. 07/25/18  Yes Johnson, Clanford L, MD  omeprazole (PRILOSEC) 40 MG capsule Take 1 capsule (40 mg total) by mouth every morning. 07/25/18  Yes Johnson, Clanford L, MD  potassium chloride SA (K-DUR,KLOR-CON) 20 MEQ tablet Take 1 tablet (20 mEq total) by mouth every morning. 07/28/18  Yes Johnson, Clanford L, MD  predniSONE (DELTASONE) 20 MG tablet Take 4 tablets (80 mg total) by mouth 2 (two) times daily with a meal. 07/30/18  Yes Lockamy, Randi L, NP-C  prochlorperazine (COMPAZINE) 10 MG tablet Take 10 mg by mouth every 6 (six) hours as needed for nausea or vomiting.   Yes [provider]  rosuvastatin (CRESTOR) 20 MG tablet Take 1 tablet (20 mg total) by mouth at bedtime.  07/25/18  Yes Johnson, Clanford L, MD  SYMBICORT 80-4.5 MCG/ACT inhaler INHALE 2 PUFFS BY MOUTH TWICE DAILY( RINSE MOUTH AFTER USE) 05/06/18  Yes SFayrene Helper MD  tiZANidine (ZANAFLEX) 4 MG tablet TAKE 1 TABLET(4 MG) BY MOUTH THREE TIMES DAILY 08/04/18  Yes SFayrene Helper MD  sulfamethoxazole-trimethoprim (BACTRIM DS,SEPTRA DS) 800-160 MG tablet Take 1 tablet by mouth 3 (three) times a week. Patient not taking: Reported on 08/08/2018 07/30/18   LGlennie Isle NP-C    Physical Exam: Vitals:   08/08/18 1600 08/08/18 1607 08/08/18 1635 08/08/18 1700  BP: (!) 175/74   (!) 172/66  Pulse: 91   92  Resp: 14   13  Temp:   98.2 F (36.8 C)   TempSrc:   Oral   SpO2: 95% 96%  95%  Weight:   97.4 kg   Height:   _0  (1.626 m)     Physical Exam  Constitutional: She is oriented to person, place, and time. She appears well-developed and well-nourished. No distress.  HENT:  Head: Normocephalic.  Mouth/Throat: Oropharynx is clear and moist.  Eyes: Right eye exhibits no discharge. Left eye exhibits no discharge.  Neck: Neck supple.  Cardiovascular: Normal rate, regular rhythm and intact distal pulses.  Pulmonary/Chest: Effort normal and breath sounds normal. No respiratory distress. She has no wheezes. She has no rales.  Abdominal: Soft. Bowel sounds are normal. She exhibits no distension. There is no abdominal tenderness. There is no guarding.  Musculoskeletal:        General: No edema.  Neurological: She is alert and oriented to person, place, and time.  Skin: Skin is warm and dry. She is not diaphoretic.  Psychiatric: She has a normal mood and affect. Her behavior is normal.     Labs on Admission: I have personally reviewed following labs and imaging studies  CBC: Recent Labs  Lab 08/08/18 0858 08/08/18 1247  WBC 10.4 11.0*  NEUTROABS 9.6* 10.5*  HGB 11.8* 12.7  HCT 39.3 41.4  MCV 89.5 88.7  PLT 106* 1341   Basic Metabolic Panel: Recent Labs  Lab  08/08/18 0858 08/08/18 1247  NA 128* 130*  K 5.7* 4.7  CL 89* 89*  CO2 25 27  GLUCOSE 667* 659*  BUN 32* 31*  CREATININE 1.25* 1.06*  CALCIUM 9.3 9.3   GFR: Estimated Creatinine Clearance: 56 mL/min (A) (by C-G formula based on SCr of 1.06 mg/dL (H)). Liver Function Tests: Recent Labs  Lab 08/08/18 0858  AST 18  ALT 24  ALKPHOS 100  BILITOT 0.7  PROT 5.9*  ALBUMIN 3.5   No results for input(s): LIPASE, AMYLASE in the last 168 hours. No results for input(s): AMMONIA in the last 168 hours. Coagulation Profile: No results for input(s): INR, PROTIME in the last 168 hours. Cardiac Enzymes: No results for input(s): CKTOTAL, CKMB, CKMBINDEX, TROPONINI in the last 168 hours. BNP (last 3 results) No results for input(s): PROBNP in the last 8760 hours. HbA1C: No results for input(s): HGBA1C in the last 72 hours. CBG: Recent Labs  Lab 08/08/18 1303 08/08/18 1423 08/08/18 1548 08/08/18 1639 08/08/18 1731  GLUCAP 593* 453* 375* 323* 305*   Lipid Profile: No results for input(s): CHOL, HDL, LDLCALC, TRIG, CHOLHDL, LDLDIRECT in the last 72 hours. Thyroid Function Tests: No results for input(s): TSH, T4TOTAL, FREET4, T3FREE, THYROIDAB in the last 72 hours. Anemia Panel: No results for input(s): VITAMINB12, FOLATE, FERRITIN, TIBC, IRON, RETICCTPCT in the last 72 hours. Urine analysis:    Component Value Date/Time   COLORURINE YELLOW 03/23/2010 1030   APPEARANCEUR CLOUDY (A) 03/23/2010 1030   LABSPEC 1.030 03/23/2010 1030   PHURINE 5.0 03/23/2010 1030   GLUCOSEU NEGATIVE 03/23/2010 1030   HGBUR NEGATIVE 03/23/2010 1030   BILIRUBINUR negative 05/31/2014 1118   KETONESUR 15 (A) 03/23/2010 1030   PROTEINUR negative 05/31/2014 1118   PROTEINUR NEGATIVE 03/23/2010 1030   UROBILINOGEN 0.2 05/31/2014 1118   UROBILINOGEN 1.0 03/23/2010 1030   NITRITE negative 05/31/2014 1118   NITRITE NEGATIVE 03/23/2010 1030   LEUKOCYTESUR Trace 05/31/2014 1118    Radiological Exams on  Admission: Dg Chest 2 View  Result Date: 08/08/2018 CLINICAL DATA:  Hyperglycemia. History of lung cancer. EXAM: CHEST - 2 VIEW COMPARISON:  07/22/2018 CXR, chest CT 07/23/2018 FINDINGS: Normal heart size. Nonaneurysmal thoracic aorta. Median sternotomy sutures are in place. Left-sided port catheter tip terminates in the proximal SVC. No pneumothorax or pulmonary consolidation. No overt pulmonary edema nor effusion. Patient has known left upper lobe dominant mass partially obscured by the overlying port catheter with adjacent smaller nodule also seen in the left upper lobe. No acute osseous abnormality. IMPRESSION: No active cardiopulmonary disease. Stable known pulmonary lesions in the left upper lobe. Electronically Signed   By: Ashley Royalty M.D.   On: 08/08/2018 13:57    Assessment/Plan Principal Problem:   Hyperglycemia Active Problems:   HLD (hyperlipidemia)   Anxiety   HTN (hypertension)   GERD (gastroesophageal reflux disease)   Anemia, deficiency   Squamous cell lung cancer, left (H[facility])   Chronic pain   Leukocytosis   Thrombocytopenia ([facility])   Hyperglycemia Suspect new onset diabetes in the setting of chronic high-dose prednisone use.  Also reports dietary indiscretions.  Hemodynamically stable and no change in mental status.  Blood glucose 659.  Potassium 4.7.  Labs not suggestive of DKA.    -Patient was started on glucose stabilizer protocol in the ED. Will continue at this time.  Most recent CBG 359. -IV fluid -Continue to monitor electrolytes -CBG every 1 hour -Check A1c level  Mild leukocytosis Likely reactive from steroid use.  White count 11.0. Chest x-ray without evidence of active cardiopulmonary disease.  Patient is not endorsing any UTI symptoms.  She has polyuria, likely related to her hyperglycemia. -Repeat CBC in a.m.  Stage IIIa squamous cell carcinoma of the left lung, history of left lung pneumonitis Currently on immunotherapy.  Followed by oncology.  Last  office visit July 30, 2018. No respiratory complaints.  Lungs clear and satting well on room air.  She has been on high-dose prednisone therapy for several months. -Outpatient oncology follow-up -Will continue home prednisone to avoid adrenal crisis  Chronic anemia -Stable.  Hemoglobin 11.8, at baseline.  Thrombocytopenia Platelet count 133,000, normal 9 days ago.  Currently not on chemotherapy.  No signs of active bleeding. -Repeat CBC in a.m.  Chronic pain -Continue home tizanidine  Hyperlipidemia -Continue home Crestor  GERD -Continue PPI  COPD -Stable.  No bronchospasm.  Continue home inhalers.  Depression, anxiety -Continue home Prozac, Xanax  Hypertension -Continue home Cardizem  DVT prophylaxis: SCDs Code Status: Patient wishes to be full code. Family Communication: Son at bedside. Disposition Plan: Anticipate discharge to home in 1 to 2 days.   Consults called: None Admission status: Observation, stepdown  Shela Leff MD [facility] Pager [phone]  If 7PM-7AM, please contact night-coverage [email or web address] Password [email or web address]  08/08/2018, 6:01 PM

## 2018-08-08 NOTE — ED Notes (Signed)
CRITICAL VALUE ALERT  Critical Value:  Glucose 659  Date & Time Notied:  08/08/18 1339  Provider Notified: Dr. [name]  Orders Received/Actions taken: EDP aware

## 2018-08-08 NOTE — ED Provider Notes (Signed)
[facility] EMERGENCY DEPARTMENT Provider Note   CSN: [id number] Arrival date & time: 08/08/18  1038     History   Chief Complaint Chief Complaint  Patient presents with  . Hyperglycemia    HPI [name] is a 72 y.o. female.   Patient with a Melanie diagnosis of lung cancer.  Left hilar area.  Patient has received chemotherapy currently receiving immunotherapy.  Was at the heme-onc center this morning they noticed that her blood sugars were greater than 600.  Patient states that she had increased thirst and urinary frequency the past 2 days.  Patient not known to be diabetic she had been receiving steroids but I think that has been halted recently.     Past Medical History:  Diagnosis Date  . Anxiety   . Arthritis   . Bipolar disorder ([place])   . Chronic back pain   . COPD (chronic obstructive pulmonary disease) ([place])   . Family history of lung cancer   . Family history of prostate cancer   . Family history of stomach cancer   . 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 GERD (gastroesophageal reflux disease) 2000  . Hyperlipidemia 1995  . Hypertension 1995  . Nicotine addiction   . Obesity     Patient Active Problem List   Diagnosis Date Noted  . SOB (shortness of breath) 07/22/2018  . Chest pain 07/22/2018  . Chronic pain 07/22/2018  . Chronic constipation 07/22/2018  . Physical deconditioning 07/22/2018  . Family history of stomach cancer   . Family history of lung cancer   . Malignant neoplasm of upper lobe of left lung ([place]) 01/22/2018  . Hospital discharge follow-up 11/09/2017  . Squamous cell lung cancer, left ([place]) 11/01/2017  . COPD (chronic obstructive pulmonary disease) ([place]) 10/29/2017  . Emphysema lung ([place]) 02/26/2017  . Mass of upper lobe of left lung 02/26/2017  . Encounter for chronic pain management 08/26/2016  . Anemia, deficiency 07/27/2016  . H/O nicotine dependence 12/05/2014  . Anxiety and depression  12/31/2013  . Female bladder prolapse 03/10/2013  . Impaired fasting glucose 10/03/2010  . Vitamin D deficiency 09/08/2009  . Back pain with sciatica 04/04/2009  . HTN (hypertension) 06/09/2008  . ABDOMINAL WALL HERNIA 06/09/2008  . HLD (hyperlipidemia) 11/14/2007  . Morbid obesity ([place]) 11/14/2007  . Anxiety 11/14/2007  . GERD (gastroesophageal reflux disease) 11/14/2007    Past Surgical History:  Procedure Laterality Date  . BACK SURGERY  1999  . BRONCHIAL BRUSHINGS Left 10/30/2017   Procedure: BRONCHIAL BRUSHINGS;  Surgeon: [name], MD;  Location: [facility] ENDO SUITE;  Service: Cardiopulmonary;  Laterality: Left;  . BRONCHIAL WASHINGS Left 10/30/2017   Procedure: BRONCHIAL WASHINGS;  Surgeon: [name], MD;  Location: [facility] ENDO SUITE;  Service: Cardiopulmonary;  Laterality: Left;  . CHOLECYSTECTOMY  2001  . COLONOSCOPY N/A 02/02/2016   Procedure: COLONOSCOPY;  Surgeon: [name], MD;  Location: [facility] ENDO SUITE;  Service: Endoscopy;  Laterality: N/A;  930  . FLEXIBLE BRONCHOSCOPY Bilateral 10/30/2017   Procedure: FLEXIBLE BRONCHOSCOPY;  Surgeon: [name], MD;  Location: [facility] ENDO SUITE;  Service: Cardiopulmonary;  Laterality: Bilateral;  . INCISIONAL HERNIA REPAIR  August 17, 2008  . left inguinal hernia herniorrhapy  1980  . PORTACATH PLACEMENT Left 02/03/2018   Procedure: INSERTION PORT-A-CATH;  Surgeon: [name], MD;  Location: [facility] ORS;  Service: General;  Laterality: Left;  . removal of thmoma  03/27/2010   Dr.  Burney   . SPINE SURGERY    . TONSILLECTOMY    . TOTAL ABDOMINAL HYSTERECTOMY W/ BILATERAL SALPINGOOPHORECTOMY  1984     OB History   No obstetric history on file.      Home Medications    Prior to Admission medications   Medication Sig Start Date End Date Taking? Authorizing Provider  albuterol (PROAIR HFA) 108 (90 Base) MCG/ACT inhaler INHALE 2 PUFFS BY MOUTH EVERY 6 HOURS IF NEEDED Patient taking differently: Inhale 2 puffs into the lungs  every 6 (six) hours as needed for wheezing or shortness of breath.  03/18/18  Yes Fayrene Helper, MD  albuterol (PROVENTIL) (2.5 MG/3ML) 0.083% nebulizer solution USE 1 VIAL IN NEBULIZER EVERY 6 HOURS AS NEEDED. 07/25/18  Yes Fayrene Helper, MD  ALPRAZolam Duanne Moron) 1 MG tablet Take 1 tablet (1 mg total) by mouth 2 (two) times daily as needed for anxiety. 08/08/18 08/08/19 Yes Fayrene Helper, MD  aspirin (ASPIRIN LOW DOSE) 81 MG EC tablet Take 81 mg by mouth daily.     Yes [provider]  Cholecalciferol (VITAMIN D3) 50 MCG (2000 UT) capsule Take 2,000 Units by mouth daily.   Yes [provider]  diltiazem (CARDIZEM CD) 120 MG 24 hr capsule Take 1 capsule (120 mg total) by mouth every morning. 07/25/18  Yes Johnson, Clanford L, MD  diphenhydrAMINE (BENADRYL) 25 mg capsule Take 25-50 mg by mouth at bedtime as needed for sleep.   Yes [provider]  docusate sodium (COLACE) 100 MG capsule Take 100-200 mg by mouth 2 (two) times daily as needed for mild constipation or moderate constipation.    Yes [provider]  EPINEPHrine 0.3 mg/0.3 mL IJ SOAJ injection Inject 0.3 mg into the muscle once.   Yes [provider]  FLUoxetine (PROZAC) 20 MG capsule Take 1 capsule (20 mg total) by mouth every morning. 07/25/18  Yes Johnson, Clanford L, MD  furosemide (LASIX) 20 MG tablet Take 2 tablets (40 mg total) by mouth every morning. 07/28/18  Yes Johnson, Clanford L, MD  guaiFENesin (MUCINEX) 600 MG 12 hr tablet Take 1 tablet (600 mg total) by mouth 2 (two) times daily. 11/01/17  Yes Kathie Dike, MD  HYDROcodone-acetaminophen (NORCO/VICODIN) 5-325 MG tablet Take one tablet three times daily for back pain 08/08/18 09/07/18 Yes Fayrene Helper, MD  ipratropium (ATROVENT) 0.02 % nebulizer solution INHALE ONE VIAL VIA NEBULIZER EVERY 6 HOURS AS NEEDED. 07/25/18  Yes Fayrene Helper, MD  lactulose (CHRONULAC) 10 GM/15ML solution Take 15-41m at bedtime  every night to assist with moving bowels.  Titrate down if having multiple bowel movements. 03/05/18  Yes KDerek Jack MD  lidocaine-prilocaine (EMLA) cream Apply a quarter size amount to port site 1 hour prior to chemo. Do not rub in. Cover with plastic wrap. 01/22/18  Yes KDerek Jack MD  lisinopril (PRINIVIL,ZESTRIL) 10 MG tablet Take 1 tablet (10 mg total) by mouth every morning. 07/25/18  Yes Johnson, Clanford L, MD  omeprazole (PRILOSEC) 40 MG capsule Take 1 capsule (40 mg total) by mouth every morning. 07/25/18  Yes Johnson, Clanford L, MD  potassium chloride SA (K-DUR,KLOR-CON) 20 MEQ tablet Take 1 tablet (20 mEq total) by mouth every morning. 07/28/18  Yes Johnson, Clanford L, MD  predniSONE (DELTASONE) 20 MG tablet Take 4 tablets (80 mg total) by mouth 2 (two) times daily with a meal. 07/30/18  Yes Lockamy, Randi L, NP-C  prochlorperazine (COMPAZINE) 10 MG tablet Take 10 mg by mouth  every 6 (six) hours as needed for nausea or vomiting.   Yes [provider]  rosuvastatin (CRESTOR) 20 MG tablet Take 1 tablet (20 mg total) by mouth at bedtime. 07/25/18  Yes Johnson, Clanford L, MD  SYMBICORT 80-4.5 MCG/ACT inhaler INHALE 2 PUFFS BY MOUTH TWICE DAILY( RINSE MOUTH AFTER USE) 05/06/18  Yes Fayrene Helper, MD  tiZANidine (ZANAFLEX) 4 MG tablet TAKE 1 TABLET(4 MG) BY MOUTH THREE TIMES DAILY 08/04/18  Yes Fayrene Helper, MD  sulfamethoxazole-trimethoprim (BACTRIM DS,SEPTRA DS) 800-160 MG tablet Take 1 tablet by mouth 3 (three) times a week. Patient not taking: Reported on 08/08/2018 07/30/18   [place]ie [place], NP-C    Family History Family History  Problem Relation Age of Onset  . Heart disease Mother        enlarged  . Diabetes Mother   . Hypertension Mother   . Cancer Father        throat cancer - smoker  . Hypertension Father   . Coronary artery disease Father   . Diabetes Sister        x3  . Asthma Sister   . Lung cancer Sister 69       d. 79;  smoker  . Kidney disease Brother   . Stomach cancer Sister        dx in her 43s-50s  . Stroke Brother   . Prostate cancer Brother 16       pat 1/2 brother  . Thyroid cancer Maternal Aunt        dx and d. in her 65s  . Bone cancer Maternal Uncle   . Bone cancer Maternal Grandfather   . Cancer Cousin        NOS - had a swollen stomach    Social History Social History   Tobacco Use  . Smoking status: Former Smoker    Packs/day: 1.50    Years: 54.00    Pack years: 81.00    Types: Cigarettes    Last attempt to quit: 08/20/2017    Years since quitting: 0.9  . Smokeless tobacco: Never Used  Substance Use Topics  . Alcohol use: No    Alcohol/week: 0.0 standard drinks  . Drug use: No     Allergies   Bee venom; Penicillins; and Codeine   Review of Systems Review of Systems  Constitutional: Negative for fever.  HENT: Negative for congestion.   Eyes: Negative for redness.  Respiratory: Negative for shortness of breath.   Cardiovascular: Negative for chest pain.  Gastrointestinal: Negative for abdominal pain.  Endocrine: Positive for polydipsia and polyuria.  Genitourinary: Positive for frequency.  Musculoskeletal: Negative for back pain.  Skin: Negative for rash.  Neurological: Negative for headaches.  Hematological: Does not bruise/bleed easily.  Psychiatric/Behavioral: Negative for confusion.     Physical Exam Updated Vital Signs BP (!) 165/77   Pulse 95   Temp 97.7 F (36.5 C) (Oral)   Resp 17   Ht 1.626 m (_0 )   Wt 108 kg   SpO2 95%   BMI 40.85 kg/m   Physical Exam Vitals signs and nursing note reviewed.  Constitutional:      General: She is not in acute distress. HENT:     Head: Normocephalic and atraumatic.     Mouth/Throat:     Mouth: Mucous membranes are dry.  Eyes:     Extraocular Movements: Extraocular movements intact.     Pupils: Pupils are equal, round, and reactive to light.  Neck:  Musculoskeletal: Neck supple.  Cardiovascular:       Rate and Rhythm: Normal rate and regular rhythm.  Pulmonary:     Effort: Pulmonary effort is normal. No respiratory distress.     Breath sounds: Normal breath sounds. No wheezing or rales.  Abdominal:     General: Abdomen is flat. Bowel sounds are normal.     Tenderness: There is no abdominal tenderness.  Musculoskeletal: Normal range of motion.  Skin:    General: Skin is warm.  Neurological:     General: No focal deficit present.     Mental Status: She is alert and oriented to person, place, and time.      ED Treatments / Results  Labs (all labs ordered are listed, but only abnormal results are displayed) Labs Reviewed  CBC WITH DIFFERENTIAL/PLATELET - Abnormal; Notable for the following components:      Result Value   WBC 11.0 (*)    RDW 15.9 (*)    Platelets 133 (*)    Neutro Abs 10.5 (*)    Lymphs Abs 0.3 (*)    All other components within normal limits  BASIC METABOLIC PANEL - Abnormal; Notable for the following components:   Sodium 130 (*)    Chloride 89 (*)    Glucose, Bld 659 (*)    BUN 31 (*)    Creatinine, Ser 1.06 (*)    GFR calc non Af Amer 53 (*)    All other components within normal limits  CBG MONITORING, ED - Abnormal; Notable for the following components:   Glucose-Capillary >600 (*)    All other components within normal limits  CBG MONITORING, ED - Abnormal; Notable for the following components:   Glucose-Capillary 593 (*)    All other components within normal limits  CBG MONITORING, ED - Abnormal; Notable for the following components:   Glucose-Capillary 453 (*)    All other components within normal limits    EKG None  Radiology Dg Chest 2 View  Result Date: 08/08/2018 CLINICAL DATA:  Hyperglycemia. History of lung cancer. EXAM: CHEST - 2 VIEW COMPARISON:  07/22/2018 CXR, chest CT 07/23/2018 FINDINGS: Normal heart size. Nonaneurysmal thoracic aorta. Median sternotomy sutures are in place. Left-sided port catheter tip terminates in the  proximal SVC. No pneumothorax or pulmonary consolidation. No overt pulmonary edema nor effusion. Patient has known left upper lobe dominant mass partially obscured by the overlying port catheter with adjacent smaller nodule also seen in the left upper lobe. No acute osseous abnormality. IMPRESSION: No active cardiopulmonary disease. Stable known pulmonary lesions in the left upper lobe. Electronically Signed   By: Ashley Royalty M.D.   On: 08/08/2018 13:57    Procedures Procedures (including critical care time)  CRITICAL CARE Performed by: Fredia Sorrow Total critical care time: 30 minutes Critical care time was exclusive of separately billable procedures and treating other patients. Critical care was necessary to treat or prevent imminent or life-threatening deterioration. Critical care was time spent personally by me on the following activities: development of treatment plan with patient and/or surrogate as well as nursing, discussions with consultants, evaluation of patient's response to treatment, examination of patient, obtaining history from patient or surrogate, ordering and performing treatments and interventions, ordering and review of laboratory studies, ordering and review of radiographic studies, pulse oximetry and re-evaluation of patient's condition.   Medications Ordered in ED Medications  0.9 %  sodium chloride infusion ( Intravenous Melanie Bag/Given 08/08/18 1354)  dextrose 5 %-0.45 % sodium chloride infusion (has  no administration in time range)  insulin regular, human (MYXREDLIN) 100 units/ 100 mL infusion (3.9 Units/hr Intravenous Rate/Dose Change 08/08/18 1425)  sodium chloride 0.9 % bolus 500 mL (0 mLs Intravenous Stopped 08/08/18 1352)  HYDROcodone-acetaminophen (NORCO/VICODIN) 5-325 MG per tablet 1 tablet (1 tablet Oral Given 08/08/18 1341)     Initial Impression / Assessment and Plan / ED Course  I have reviewed the triage vital signs and the nursing notes.  Pertinent  labs & imaging results that were available during my care of the patient were reviewed by me and considered in my medical decision making (see chart for details).    Patient's blood sugar here significantly elevated.  Labs not consistent with DKA.  Patient started on glucose stabilizer.  Patient receiving IV fluids.  Patient's potassium initially elevated.  Which is reassuring in the face of the need for the IV insulin.  Patient's chest x-ray without any significant changes she seems to be stable from the lung cancer standpoint.  Discussed with hospitalist they will admit for for hyperglycemia and probable Melanie onset diabetes.   Final Clinical Impressions(s) / ED Diagnoses   Final diagnoses:  Hyperglycemia  Malignant neoplasm of lower lobe of left lung [facility])    ED Discharge Orders    None       Fredia Sorrow, MD 08/08/18 [id number]

## 2018-08-09 DIAGNOSIS — K219 Gastro-esophageal reflux disease without esophagitis: Secondary | ICD-10-CM | POA: Diagnosis not present

## 2018-08-09 DIAGNOSIS — C3432 Malignant neoplasm of lower lobe, left bronchus or lung: Secondary | ICD-10-CM

## 2018-08-09 DIAGNOSIS — G8929 Other chronic pain: Secondary | ICD-10-CM

## 2018-08-09 DIAGNOSIS — R739 Hyperglycemia, unspecified: Secondary | ICD-10-CM

## 2018-08-09 DIAGNOSIS — D539 Nutritional anemia, unspecified: Secondary | ICD-10-CM | POA: Diagnosis not present

## 2018-08-09 DIAGNOSIS — E785 Hyperlipidemia, unspecified: Secondary | ICD-10-CM | POA: Diagnosis not present

## 2018-08-09 DIAGNOSIS — C3492 Malignant neoplasm of unspecified part of left bronchus or lung: Secondary | ICD-10-CM | POA: Diagnosis not present

## 2018-08-09 DIAGNOSIS — I10 Essential (primary) hypertension: Secondary | ICD-10-CM | POA: Diagnosis not present

## 2018-08-09 LAB — GLUCOSE, CAPILLARY
Glucose-Capillary: 110 mg/dL — ABNORMAL HIGH (ref 70–99)
Glucose-Capillary: 133 mg/dL — ABNORMAL HIGH (ref 70–99)
Glucose-Capillary: 150 mg/dL — ABNORMAL HIGH (ref 70–99)
Glucose-Capillary: 175 mg/dL — ABNORMAL HIGH (ref 70–99)
Glucose-Capillary: 203 mg/dL — ABNORMAL HIGH (ref 70–99)
Glucose-Capillary: 243 mg/dL — ABNORMAL HIGH (ref 70–99)
Glucose-Capillary: 255 mg/dL — ABNORMAL HIGH (ref 70–99)
Glucose-Capillary: 352 mg/dL — ABNORMAL HIGH (ref 70–99)
Glucose-Capillary: 443 mg/dL — ABNORMAL HIGH (ref 70–99)
Glucose-Capillary: 468 mg/dL — ABNORMAL HIGH (ref 70–99)

## 2018-08-09 LAB — CBC
HCT: 38 % (ref 36.0–46.0)
Hemoglobin: 11.3 g/dL — ABNORMAL LOW (ref 12.0–15.0)
MCH: 26.1 pg (ref 26.0–34.0)
MCHC: 29.7 g/dL — ABNORMAL LOW (ref 30.0–36.0)
MCV: 87.8 fL (ref 80.0–100.0)
NRBC: 0 % (ref 0.0–0.2)
Platelets: 120 10*3/uL — ABNORMAL LOW (ref 150–400)
RBC: 4.33 MIL/uL (ref 3.87–5.11)
RDW: 16 % — ABNORMAL HIGH (ref 11.5–15.5)
WBC: 9.2 10*3/uL (ref 4.0–10.5)

## 2018-08-09 LAB — BASIC METABOLIC PANEL
Anion gap: 10 (ref 5–15)
BUN: 20 mg/dL (ref 8–23)
CHLORIDE: 100 mmol/L (ref 98–111)
CO2: 28 mmol/L (ref 22–32)
Calcium: 8.3 mg/dL — ABNORMAL LOW (ref 8.9–10.3)
Creatinine, Ser: 0.73 mg/dL (ref 0.44–1.00)
GFR calc Af Amer: >60 mL/min (ref >60)
GFR calc non Af Amer: >60 mL/min (ref >60)
Glucose, Bld: 168 mg/dL — ABNORMAL HIGH (ref 70–99)
Potassium: 3.9 mmol/L (ref 3.5–5.1)
Sodium: 138 mmol/L (ref 135–145)

## 2018-08-09 LAB — HEMOGLOBIN A1C
Hgb A1c MFr Bld: 9.4 % — ABNORMAL HIGH (ref 4.8–5.6)
Mean Plasma Glucose: 223.08 mg/dL

## 2018-08-09 MED ORDER — INSULIN ASPART 100 UNIT/ML ~~LOC~~ SOLN
0.0000 [IU] | Freq: Every day | SUBCUTANEOUS | Status: DC
  Administered 2018-08-09 – 2018-08-10 (×2): 3 [IU] via SUBCUTANEOUS

## 2018-08-09 MED ORDER — ENOXAPARIN SODIUM 60 MG/0.6ML ~~LOC~~ SOLN
50.0000 mg | SUBCUTANEOUS | Status: DC
  Administered 2018-08-09 – 2018-08-10 (×2): 50 mg via SUBCUTANEOUS
  Filled 2018-08-09 (×2): qty 0.6

## 2018-08-09 MED ORDER — INSULIN ASPART 100 UNIT/ML ~~LOC~~ SOLN
0.0000 [IU] | Freq: Three times a day (TID) | SUBCUTANEOUS | Status: DC
  Administered 2018-08-09: 3 [IU] via SUBCUTANEOUS

## 2018-08-09 MED ORDER — INSULIN GLARGINE 100 UNIT/ML ~~LOC~~ SOLN
10.0000 [IU] | Freq: Every day | SUBCUTANEOUS | Status: DC
  Administered 2018-08-09: 10 [IU] via SUBCUTANEOUS
  Filled 2018-08-09 (×3): qty 0.1

## 2018-08-09 MED ORDER — INSULIN ASPART 100 UNIT/ML ~~LOC~~ SOLN: 0.0000 [IU] | Freq: Every day | SUBCUTANEOUS | Status: DC

## 2018-08-09 MED ORDER — SODIUM CHLORIDE 0.9 % IV SOLN
INTRAVENOUS | Status: DC
  Administered 2018-08-09 – 2018-08-10 (×2): via INTRAVENOUS

## 2018-08-09 MED ORDER — INSULIN GLARGINE 100 UNIT/ML ~~LOC~~ SOLN
10.0000 [IU] | SUBCUTANEOUS | Status: AC
  Administered 2018-08-09: 10 [IU] via SUBCUTANEOUS
  Filled 2018-08-09: qty 0.1

## 2018-08-09 MED ORDER — INSULIN ASPART 100 UNIT/ML ~~LOC~~ SOLN
0.0000 [IU] | Freq: Three times a day (TID) | SUBCUTANEOUS | Status: DC
  Administered 2018-08-09: 4 [IU] via SUBCUTANEOUS
  Administered 2018-08-09: 20 [IU] via SUBCUTANEOUS
  Administered 2018-08-10: 15 [IU] via SUBCUTANEOUS
  Administered 2018-08-10: 11 [IU] via SUBCUTANEOUS
  Administered 2018-08-10: 15 [IU] via SUBCUTANEOUS
  Administered 2018-08-11: 11 [IU] via SUBCUTANEOUS
  Administered 2018-08-11: 15 [IU] via SUBCUTANEOUS

## 2018-08-09 MED ORDER — INSULIN GLARGINE 100 UNIT/ML ~~LOC~~ SOLN
30.0000 [IU] | Freq: Every day | SUBCUTANEOUS | Status: DC
  Administered 2018-08-09: 30 [IU] via SUBCUTANEOUS
  Filled 2018-08-09 (×2): qty 0.3

## 2018-08-09 MED ORDER — SODIUM CHLORIDE 0.9% FLUSH
3.0000 mL | Freq: Two times a day (BID) | INTRAVENOUS | Status: DC
  Administered 2018-08-09 – 2018-08-11 (×5): 3 mL via INTRAVENOUS

## 2018-08-09 MED ORDER — INSULIN GLARGINE 100 UNIT/ML ~~LOC~~ SOLN
25.0000 [IU] | Freq: Every day | SUBCUTANEOUS | Status: DC
  Filled 2018-08-09 (×2): qty 0.25

## 2018-08-09 MED ORDER — POTASSIUM CHLORIDE CRYS ER 20 MEQ PO TBCR
40.0000 meq | EXTENDED_RELEASE_TABLET | Freq: Once | ORAL | Status: AC
  Administered 2018-08-09: 40 meq via ORAL
  Filled 2018-08-09: qty 2

## 2018-08-09 MED ORDER — INSULIN ASPART 100 UNIT/ML ~~LOC~~ SOLN
25.0000 [IU] | Freq: Once | SUBCUTANEOUS | Status: AC
  Administered 2018-08-09: 25 [IU] via SUBCUTANEOUS

## 2018-08-09 NOTE — Progress Notes (Signed)
PROGRESS NOTE    [name]  OJJ:[id number] DOB: 1946-03-12 DOA: 08/08/2018 PCP: Fayrene Helper, MD    Brief Narrative:  72 year old female with a history of lung cancer on immunotherapy with recent pneumonitis started on high-dose steroids.  She is followed at the cancer center and had lab work drawn which her blood sugar was noted to be over 600.  She was called at home and advised to come to the emergency room for evaluation.  She reported increasing polydipsia and polyuria.  She was admitted to the hospital on insulin infusion.   Assessment & Plan:   Principal Problem:   Hyperglycemia Active Problems:   HLD (hyperlipidemia)   Anxiety   HTN (hypertension)   GERD (gastroesophageal reflux disease)   Anemia, deficiency   Squamous cell lung cancer, left ([facility])   Chronic pain   Leukocytosis   Thrombocytopenia ([facility])   1. New onset diabetes, uncontrolled with hyperglycemia.  No evidence of DKA on admission.  Overall hyperglycemia improved with insulin infusion.  She is been transitioned to subcutaneous insulin.  Blood sugars have been running high today.  She will need to be monitored for another 24 hours to ensure stability of blood sugars.  A1c in process. 2. 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  Followed by oncology. 3. Left lung pneumonitis.  Still on high-dose prednisone.  This will be tapered as an outpatient per oncology. 4. Chronic pain.  Continue on tizanidine. 5. Hyperlipidemia.  Continue on Crestor 6. GERD.  Continue PPI 7. Hypertension.  Stable on Cardizem   DVT prophylaxis: Lovenox Code Status: Full code Family Communication: Discussed with family at the bedside Disposition Plan: Discharge home once improved   Consultants:     Procedures:     Antimicrobials:       Subjective: She is feeling better today.  No cough or shortness of breath.  No vomiting.  Objective: Vitals:   08/09/18 1128 08/09/18 1400  08/09/18 1500 08/09/18 1608  BP:  (!) 168/74 (!) 168/84   Pulse:  (!) 103 95   Resp:  (!) 23 15   Temp: 98.3 F (36.8 C)   98.1 F (36.7 C)  TempSrc: Oral   Oral  SpO2:  95% 95%   Weight:      Height:        Intake/Output Summary (Last 24 hours) at 08/09/2018 1657 Last data filed at 08/09/2018 1513 Gross per 24 hour  Intake 2195.76 ml  Output -  Net 2195.76 ml   Filed Weights   08/08/18 1115 08/08/18 1635 08/09/18 0500  Weight: 108 kg 97.4 kg 99 kg    Examination:  General exam: Appears calm and comfortable  Respiratory system: Clear to auscultation. Respiratory effort normal. Cardiovascular system: S1 & S2 heard, RRR. No JVD, murmurs, rubs, gallops or clicks. No pedal edema. Gastrointestinal system: Abdomen is nondistended, soft and nontender. No organomegaly or masses felt. Normal bowel sounds heard. Central nervous system: Alert and oriented. No focal neurological deficits. Extremities: Symmetric 5 x 5 power. Skin: No rashes, lesions or ulcers Psychiatry: Judgement and insight appear normal. Mood & affect appropriate.     Data Reviewed: I have personally reviewed following labs and imaging studies  CBC: Recent Labs  Lab 08/08/18 0858 08/08/18 1247 08/09/18 0435  WBC 10.4 11.0* 9.2  NEUTROABS 9.6* 10.5*  --   HGB 11.8* 12.7 11.3*  HCT 39.3 41.4 38.0  MCV 89.5 88.7 87.8  PLT 106* 133* 120*  Basic Metabolic Panel: Recent Labs  Lab 08/08/18 0858 08/08/18 1247 08/08/18 2054 08/09/18 0434  NA 128* 130*  --  138  K 5.7* 4.7 4.2 3.9  CL 89* 89*  --  100  CO2 25 27  --  28  GLUCOSE 667* 659*  --  168*  BUN 32* 31*  --  20  CREATININE 1.25* 1.06*  --  0.73  CALCIUM 9.3 9.3  --  8.3*   GFR: Estimated Creatinine Clearance: 74.8 mL/min (by C-G formula based on SCr of 0.73 mg/dL). Liver Function Tests: Recent Labs  Lab 08/08/18 0858  AST 18  ALT 24  ALKPHOS 100  BILITOT 0.7  PROT 5.9*  ALBUMIN 3.5   No results for input(s): LIPASE, AMYLASE in  the last 168 hours. No results for input(s): AMMONIA in the last 168 hours. Coagulation Profile: No results for input(s): INR, PROTIME in the last 168 hours. Cardiac Enzymes: No results for input(s): CKTOTAL, CKMB, CKMBINDEX, TROPONINI in the last 168 hours. BNP (last 3 results) No results for input(s): PROBNP in the last 8760 hours. HbA1C: Recent Labs    08/08/18 1247  HGBA1C 9.4*   CBG: Recent Labs  Lab 08/09/18 0812 08/09/18 1127 08/09/18 1246 08/09/18 1433 08/09/18 1611  GLUCAP 243* 468* 443* 352* 175*   Lipid Profile: No results for input(s): CHOL, HDL, LDLCALC, TRIG, CHOLHDL, LDLDIRECT in the last 72 hours. Thyroid Function Tests: No results for input(s): TSH, T4TOTAL, FREET4, T3FREE, THYROIDAB in the last 72 hours. Anemia Panel: No results for input(s): VITAMINB12, FOLATE, FERRITIN, TIBC, IRON, RETICCTPCT in the last 72 hours. Sepsis Labs: No results for input(s): PROCALCITON, LATICACIDVEN in the last 168 hours.  Recent Results (from the past 240 hour(s))  MRSA PCR Screening     Status: None   Collection Time: 08/08/18  4:40 PM  Result Value Ref Range Status   MRSA by PCR NEGATIVE NEGATIVE Final    Comment:        The GeneXpert MRSA Assay (FDA approved for NASAL specimens only), is one component of a comprehensive MRSA colonization surveillance program. It is not intended to diagnose MRSA infection nor to guide or monitor treatment for MRSA infections. Performed at [facility], [address] [place], [place] [address]          Radiology Studies: Dg Chest 2 View  Result Date: 08/08/2018 CLINICAL DATA:  Hyperglycemia. History of lung cancer. EXAM: CHEST - 2 VIEW COMPARISON:  07/22/2018 CXR, chest CT 07/23/2018 FINDINGS: Normal heart size. Nonaneurysmal thoracic aorta. Median sternotomy sutures are in place. Left-sided port catheter tip terminates in the proximal SVC. No pneumothorax or pulmonary consolidation. No overt pulmonary edema nor effusion.  Patient has known left upper lobe dominant mass partially obscured by the overlying port catheter with adjacent smaller nodule also seen in the left upper lobe. No acute osseous abnormality. IMPRESSION: No active cardiopulmonary disease. Stable known pulmonary lesions in the left upper lobe. Electronically Signed   By: Ashley Royalty M.D.   On: 08/08/2018 13:57        Scheduled Meds: . aspirin EC  81 mg Oral Daily  . cholecalciferol  2,000 Units Oral Daily  . diltiazem  120 mg Oral q morning - 10a  . FLUoxetine  20 mg Oral q morning - 10a  . guaiFENesin  600 mg Oral BID  . insulin aspart  0-20 Units Subcutaneous TID WC  . insulin aspart  0-5 Units Subcutaneous QHS  . insulin glargine  30 Units Subcutaneous  QHS  . mometasone-formoterol  2 puff Inhalation BID  . pantoprazole  40 mg Oral Daily  . potassium chloride  20 mEq Oral Once  . potassium chloride  40 mEq Oral Once  . predniSONE  80 mg Oral BID WC  . rosuvastatin  20 mg Oral QHS  . sodium chloride flush  3 mL Intravenous Q12H  . tiZANidine  4 mg Oral TID   Continuous Infusions: . sodium chloride    . insulin Stopped (08/09/18 0354)     LOS: 0 days    Time spent: 67mins    Kathie Dike, MD [facility] Pager [phone]  If 7PM-7AM, please contact night-coverage [email or web address] Password [facility] 08/09/2018, 4:57 PM

## 2018-08-10 DIAGNOSIS — E785 Hyperlipidemia, unspecified: Secondary | ICD-10-CM | POA: Diagnosis not present

## 2018-08-10 DIAGNOSIS — G8929 Other chronic pain: Secondary | ICD-10-CM | POA: Diagnosis not present

## 2018-08-10 DIAGNOSIS — I10 Essential (primary) hypertension: Secondary | ICD-10-CM | POA: Diagnosis not present

## 2018-08-10 DIAGNOSIS — D539 Nutritional anemia, unspecified: Secondary | ICD-10-CM | POA: Diagnosis not present

## 2018-08-10 DIAGNOSIS — R739 Hyperglycemia, unspecified: Secondary | ICD-10-CM | POA: Diagnosis not present

## 2018-08-10 DIAGNOSIS — C3432 Malignant neoplasm of lower lobe, left bronchus or lung: Secondary | ICD-10-CM | POA: Diagnosis not present

## 2018-08-10 DIAGNOSIS — C3492 Malignant neoplasm of unspecified part of left bronchus or lung: Secondary | ICD-10-CM | POA: Diagnosis not present

## 2018-08-10 DIAGNOSIS — K219 Gastro-esophageal reflux disease without esophagitis: Secondary | ICD-10-CM | POA: Diagnosis not present

## 2018-08-10 LAB — BASIC METABOLIC PANEL
ANION GAP: 8 (ref 5–15)
BUN: 19 mg/dL (ref 8–23)
CO2: 25 mmol/L (ref 22–32)
Calcium: 7.9 mg/dL — ABNORMAL LOW (ref 8.9–10.3)
Chloride: 102 mmol/L (ref 98–111)
Creatinine, Ser: 0.71 mg/dL (ref 0.44–1.00)
GFR calc Af Amer: >60 mL/min (ref >60)
GFR calc non Af Amer: >60 mL/min (ref >60)
Glucose, Bld: 333 mg/dL — ABNORMAL HIGH (ref 70–99)
POTASSIUM: 4.4 mmol/L (ref 3.5–5.1)
Sodium: 135 mmol/L (ref 135–145)

## 2018-08-10 LAB — GLUCOSE, CAPILLARY
GLUCOSE-CAPILLARY: 287 mg/dL — AB (ref 70–99)
Glucose-Capillary: 296 mg/dL — ABNORMAL HIGH (ref 70–99)
Glucose-Capillary: 339 mg/dL — ABNORMAL HIGH (ref 70–99)
Glucose-Capillary: 325 mg/dL — ABNORMAL HIGH (ref 70–99)

## 2018-08-10 MED ORDER — INSULIN GLARGINE 100 UNIT/ML ~~LOC~~ SOLN
45.0000 [IU] | Freq: Every day | SUBCUTANEOUS | Status: DC
  Administered 2018-08-10: 45 [IU] via SUBCUTANEOUS
  Filled 2018-08-10 (×2): qty 0.45

## 2018-08-10 NOTE — Progress Notes (Signed)
PROGRESS NOTE    [name]  ENI:[id number] DOB: November 17, 1945 DOA: 08/08/2018 PCP: Fayrene Helper, MD    Brief Narrative:  72 year old female with a history of lung cancer on immunotherapy with recent pneumonitis started on high-dose steroids.  She is followed at the cancer center and had lab work drawn which her blood sugar was noted to be over 600.  She was called at home and advised to come to the emergency room for evaluation.  She reported increasing polydipsia and polyuria.  She was admitted to the hospital on insulin infusion.   Assessment & Plan:   Principal Problem:   Hyperglycemia Active Problems:   HLD (hyperlipidemia)   Anxiety   HTN (hypertension)   GERD (gastroesophageal reflux disease)   Anemia, deficiency   Squamous cell lung cancer, left (HCC)   Chronic pain   Leukocytosis   Thrombocytopenia (HCC)   1. New onset diabetes, uncontrolled with hyperglycemia.  No evidence of DKA on admission.  Overall hyperglycemia improved with insulin infusion.  She is been transitioned to subcutaneous insulin.  Blood sugars continue to run high today.  She will need to be monitored for another 24 hours to ensure stability of blood sugars.  Lantus has been adjusted.  A1c is 9.4.  Will likely need to discharge home on insulin 2. 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  Followed by oncology. 3. Left lung pneumonitis.  Still on high-dose prednisone.  This will be tapered as an outpatient per oncology. 4. Chronic pain.  Continue on tizanidine. 5. Hyperlipidemia.  Continue on Crestor 6. GERD.  Continue PPI 7. Hypertension.  Stable on Cardizem   DVT prophylaxis: Lovenox Code Status: Full code Family Communication: No family present Disposition Plan: Discharge home once blood sugars have stabilized   Consultants:     Procedures:     Antimicrobials:       Subjective: She is feeling better today.  Blood sugars remain high.  No  cough or shortness of breath.  Objective: Vitals:   08/10/18 1500 08/10/18 1600 08/10/18 1700 08/10/18 1711  BP: (!) 170/70 (!) 183/75 (!) 197/76   Pulse: 88 87 99   Resp: (!) 30 19 19    Temp:    98.1 F (36.7 C)  TempSrc:    Oral  SpO2: 96% 97% 95%   Weight:      Height:        Intake/Output Summary (Last 24 hours) at 08/10/2018 1749 Last data filed at 08/09/2018 2114 Gross per 24 hour  Intake 3 ml  Output -  Net 3 ml   Filed Weights   08/08/18 1635 08/09/18 0500 08/10/18 0500  Weight: 97.4 kg 99 kg 100.8 kg    Examination:  General exam: Appears calm and comfortable  Respiratory system: Clear to auscultation. Respiratory effort normal. Cardiovascular system: S1 & S2 heard, RRR. No JVD, murmurs, rubs, gallops or clicks. No pedal edema. Gastrointestinal system: Abdomen is nondistended, soft and nontender. No organomegaly or masses felt. Normal bowel sounds heard. Central nervous system: Alert and oriented. No focal neurological deficits. Extremities: Symmetric 5 x 5 power. Skin: No rashes, lesions or ulcers Psychiatry: Judgement and insight appear normal. Mood & affect appropriate.     Data Reviewed: I have personally reviewed following labs and imaging studies  CBC: Recent Labs  Lab 08/08/18 0858 08/08/18 1247 08/09/18 0435  WBC 10.4 11.0* 9.2  NEUTROABS 9.6* 10.5*  --   HGB 11.8* 12.7 11.3*  HCT 39.3 41.4  38.0  MCV 89.5 88.7 87.8  PLT 106* 133* 409*   Basic Metabolic Panel: Recent Labs  Lab 08/08/18 0858 08/08/18 1247 08/08/18 2054 08/09/18 0434 08/10/18 0414  NA 128* 130*  --  138 135  K 5.7* 4.7 4.2 3.9 4.4  CL 89* 89*  --  100 102  CO2 25 27  --  28 25  GLUCOSE 667* 659*  --  168* 333*  BUN 32* 31*  --  20 19  CREATININE 1.25* 1.06*  --  0.73 0.71  CALCIUM 9.3 9.3  --  8.3* 7.9*   GFR: Estimated Creatinine Clearance: 75.5 mL/min (by C-G formula based on SCr of 0.71 mg/dL). Liver Function Tests: Recent Labs  Lab 08/08/18 0858  AST 18    ALT 24  ALKPHOS 100  BILITOT 0.7  PROT 5.9*  ALBUMIN 3.5   No results for input(s): LIPASE, AMYLASE in the last 168 hours. No results for input(s): AMMONIA in the last 168 hours. Coagulation Profile: No results for input(s): INR, PROTIME in the last 168 hours. Cardiac Enzymes: No results for input(s): CKTOTAL, CKMB, CKMBINDEX, TROPONINI in the last 168 hours. BNP (last 3 results) No results for input(s): PROBNP in the last 8760 hours. HbA1C: Recent Labs    08/08/18 1247  HGBA1C 9.4*   CBG: Recent Labs  Lab 08/09/18 1611 08/09/18 2114 08/10/18 0729 08/10/18 1117 08/10/18 1713  GLUCAP 175* 255* 296* 339* 325*   Lipid Profile: No results for input(s): CHOL, HDL, LDLCALC, TRIG, CHOLHDL, LDLDIRECT in the last 72 hours. Thyroid Function Tests: No results for input(s): TSH, T4TOTAL, FREET4, T3FREE, THYROIDAB in the last 72 hours. Anemia Panel: No results for input(s): VITAMINB12, FOLATE, FERRITIN, TIBC, IRON, RETICCTPCT in the last 72 hours. Sepsis Labs: No results for input(s): PROCALCITON, LATICACIDVEN in the last 168 hours.  Recent Results (from the past 240 hour(s))  MRSA PCR Screening     Status: None   Collection Time: 08/08/18  4:40 PM  Result Value Ref Range Status   MRSA by PCR NEGATIVE NEGATIVE Final    Comment:        The GeneXpert MRSA Assay (FDA approved for NASAL specimens only), is one component of a comprehensive MRSA colonization surveillance program. It is not intended to diagnose MRSA infection nor to guide or monitor treatment for MRSA infections. Performed at [facility], [address], [place], [place] [address]          Radiology Studies: No results found.      Scheduled Meds: . aspirin EC  81 mg Oral Daily  . cholecalciferol  2,000 Units Oral Daily  . diltiazem  120 mg Oral q morning - 10a  . enoxaparin (LOVENOX) injection  50 mg Subcutaneous Q24H  . FLUoxetine  20 mg Oral q morning - 10a  . guaiFENesin  600 mg Oral BID   . insulin aspart  0-20 Units Subcutaneous TID WC  . insulin aspart  0-5 Units Subcutaneous QHS  . insulin glargine  45 Units Subcutaneous QHS  . mometasone-formoterol  2 puff Inhalation BID  . pantoprazole  40 mg Oral Daily  . potassium chloride  20 mEq Oral Once  . predniSONE  80 mg Oral BID WC  . rosuvastatin  20 mg Oral QHS  . sodium chloride flush  3 mL Intravenous Q12H  . tiZANidine  4 mg Oral TID   Continuous Infusions: . sodium chloride 100 mL/hr at 08/10/18 0253  . insulin Stopped (08/09/18 0354)     LOS:  0 days    Time spent: 23mins    Kathie Dike, MD [facility] Pager [phone]  If 7PM-7AM, please contact night-coverage [email or web address] Password [facility] 08/10/2018, 5:49 PM

## 2018-08-11 ENCOUNTER — Other Ambulatory Visit (HOSPITAL_COMMUNITY): Payer: Self-pay

## 2018-08-11 ENCOUNTER — Ambulatory Visit (HOSPITAL_COMMUNITY): Payer: Self-pay | Admitting: Hematology

## 2018-08-11 DIAGNOSIS — I10 Essential (primary) hypertension: Secondary | ICD-10-CM | POA: Diagnosis not present

## 2018-08-11 DIAGNOSIS — K219 Gastro-esophageal reflux disease without esophagitis: Secondary | ICD-10-CM | POA: Diagnosis not present

## 2018-08-11 DIAGNOSIS — G8929 Other chronic pain: Secondary | ICD-10-CM | POA: Diagnosis not present

## 2018-08-11 DIAGNOSIS — E785 Hyperlipidemia, unspecified: Secondary | ICD-10-CM | POA: Diagnosis not present

## 2018-08-11 DIAGNOSIS — R739 Hyperglycemia, unspecified: Secondary | ICD-10-CM | POA: Diagnosis not present

## 2018-08-11 DIAGNOSIS — D539 Nutritional anemia, unspecified: Secondary | ICD-10-CM | POA: Diagnosis not present

## 2018-08-11 DIAGNOSIS — C3492 Malignant neoplasm of unspecified part of left bronchus or lung: Secondary | ICD-10-CM

## 2018-08-11 DIAGNOSIS — C3432 Malignant neoplasm of lower lobe, left bronchus or lung: Secondary | ICD-10-CM | POA: Diagnosis not present

## 2018-08-11 LAB — GLUCOSE, CAPILLARY
GLUCOSE-CAPILLARY: 310 mg/dL — AB (ref 70–99)
Glucose-Capillary: 279 mg/dL — ABNORMAL HIGH (ref 70–99)

## 2018-08-11 MED ORDER — INSULIN STARTER KIT- PEN NEEDLES (ENGLISH)
1.0000 | Freq: Once | Status: AC
  Administered 2018-08-11: 1
  Filled 2018-08-11: qty 1

## 2018-08-11 MED ORDER — METFORMIN HCL 500 MG PO TABS: 500.0000 mg | ORAL_TABLET | Freq: Two times a day (BID) | ORAL | 11 refills | Status: DC

## 2018-08-11 MED ORDER — BLOOD GLUCOSE MONITOR KIT: PACK | 0 refills | Status: DC

## 2018-08-11 MED ORDER — INSULIN PEN NEEDLE 31G X 8 MM MISC: 0 refills | Status: DC

## 2018-08-11 MED ORDER — INSULIN GLARGINE 100 UNIT/ML SOLOSTAR PEN: 50.0000 [IU] | PEN_INJECTOR | Freq: Every day | SUBCUTANEOUS | 11 refills | Status: AC

## 2018-08-11 NOTE — Progress Notes (Signed)
This RN went over discharge instructions with patient and family. This RN taught her how to use the Lantus pen at bed time and how to draw up and administer the 50 units and pt verbalized and demonstrated understanding. I stressed the importance on how to check CBG before 3 meals during the day and at night and to also take the metformin with a meal in the morning and in the evening. Pt verbalized understanding and answered all my questions correctly. Pt is in stable condition to be discharged.   Jeris Penta, RN

## 2018-08-11 NOTE — Discharge Summary (Signed)
Physician Discharge Summary  [name] DGU:[id number] DOB: Feb 27, 1946 DOA: 08/08/2018  PCP: Fayrene Helper, MD  Admit date: 08/08/2018 Discharge date: 08/11/2018  Admitted From: Home Disposition: Home  Recommendations for Outpatient Follow-up:  1. Follow up with PCP in 1-2 weeks 2. Please obtain BMP/CBC in one week 3. Follow-up with oncology as previously scheduled  Discharge Condition: Stable CODE STATUS: Full code Diet recommendation: Heart healthy, carb modified  Brief/Interim Summary: 72 year old female with a history of lung cancer on immunotherapy with recent pneumonitis started on high-dose steroids.  She is followed at the cancer center and had lab work drawn which her blood sugar was noted to be over 600.  She was called at home and advised to come to the emergency room for evaluation.  She reported increasing polydipsia and polyuria.  She was admitted to the hospital on insulin infusion.  Discharge Diagnoses:  Principal Problem:   Hyperglycemia Active Problems:   HLD (hyperlipidemia)   Anxiety   HTN (hypertension)   GERD (gastroesophageal reflux disease)   Anemia, deficiency   Squamous cell lung cancer, left (HCC)   Chronic pain   Leukocytosis   Thrombocytopenia ([place])  1. New onset diabetes, uncontrolled with hyperglycemia.  No evidence of DKA on admission.  Overall hyperglycemia improved with insulin infusion.  She is been transitioned to subcutaneous insulin.  Blood sugars remain elevated, but can likely be further managed as an outpatient.    Lantus dose has been adjusted and she will be started on metformin.  A1c is 9.4.    She has been educated on checking blood sugars and administration of insulin 2. 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  Followed by oncology. 3. Left lung pneumonitis.  Still on high-dose prednisone.  This will be tapered as an outpatient per oncology. 4. Chronic pain.  Continue on  tizanidine. 5. Hyperlipidemia.  Continue on Crestor 6. GERD.  Continue PPI 7. Hypertension.  Stable on Cardizem  Discharge Instructions  Discharge Instructions    Diet - low sodium heart healthy   Complete by:  As directed    Increase activity slowly   Complete by:  As directed      Allergies as of 08/11/2018      Reactions   Bee Venom Anaphylaxis   Penicillins Itching   Blisters in mouth after dental work Has patient had a PCN reaction causing immediate rash, facial/tongue/throat swelling, SOB or lightheadedness with hypotension: no Has patient had a PCN reaction causing severe rash involving mucus membranes or skin necrosis: No Has patient had a PCN reaction that required hospitalization No Has patient had a PCN reaction occurring within the last 10 years: Yes If all of the above answers are "NO", then may proceed with Cephalosporin use.   Codeine       Medication List    STOP taking these medications   EPINEPHrine 0.3 mg/0.3 mL Soaj injection Commonly known as:  EPI-PEN     TAKE these medications   albuterol 108 (90 Base) MCG/ACT inhaler Commonly known as:  PROAIR HFA INHALE 2 PUFFS BY MOUTH EVERY 6 HOURS IF NEEDED What changed:    how much to take  how to take this  when to take this  reasons to take this  additional instructions   albuterol (2.5 MG/3ML) 0.083% nebulizer solution Commonly known as:  PROVENTIL USE 1 VIAL IN NEBULIZER EVERY 6 HOURS AS NEEDED. What changed:  Another medication with the same name was changed. Make sure  you understand how and when to take each.   ALPRAZolam 1 MG tablet Commonly known as:  XANAX Take 1 tablet (1 mg total) by mouth 2 (two) times daily as needed for anxiety.   ASPIRIN LOW DOSE 81 MG EC tablet Generic drug:  aspirin Take 81 mg by mouth daily.   blood glucose meter kit and supplies Kit Dispense based on patient and insurance preference. Use up to four times daily as directed. (FOR ICD-9 250.00, 250.01).    diltiazem 120 MG 24 hr capsule Commonly known as:  CARDIZEM CD Take 1 capsule (120 mg total) by mouth every morning.   diphenhydrAMINE 25 mg capsule Commonly known as:  BENADRYL Take 25-50 mg by mouth at bedtime as needed for sleep.   docusate sodium 100 MG capsule Commonly known as:  COLACE Take 100-200 mg by mouth 2 (two) times daily as needed for mild constipation or moderate constipation.   FLUoxetine 20 MG capsule Commonly known as:  PROZAC Take 1 capsule (20 mg total) by mouth every morning.   furosemide 20 MG tablet Commonly known as:  LASIX Take 2 tablets (40 mg total) by mouth every morning.   guaiFENesin 600 MG 12 hr tablet Commonly known as:  MUCINEX Take 1 tablet (600 mg total) by mouth 2 (two) times daily.   HYDROcodone-acetaminophen 5-325 MG tablet Commonly known as:  NORCO/VICODIN Take one tablet three times daily for back pain   Insulin Glargine 100 UNIT/ML Solostar Pen Commonly known as:  LANTUS Inject 50 Units into the skin at bedtime.   Insulin Pen Needle 31G X 8 MM Misc Use as directed   ipratropium 0.02 % nebulizer solution Commonly known as:  ATROVENT INHALE ONE VIAL VIA NEBULIZER EVERY 6 HOURS AS NEEDED.   lactulose 10 GM/15ML solution Commonly known as:  CHRONULAC Take 15-3m at bedtime every night to assist with moving bowels.  Titrate down if having multiple bowel movements.   lidocaine-prilocaine cream Commonly known as:  EMLA Apply a quarter size amount to port site 1 hour prior to chemo. Do not rub in. Cover with plastic wrap.   lisinopril 10 MG tablet Commonly known as:  PRINIVIL,ZESTRIL Take 1 tablet (10 mg total) by mouth every morning.   metFORMIN 500 MG tablet Commonly known as:  GLUCOPHAGE Take 1 tablet (500 mg total) by mouth 2 (two) times daily with a meal.   omeprazole 40 MG capsule Commonly known as:  PRILOSEC Take 1 capsule (40 mg total) by mouth every morning.   potassium chloride SA 20 MEQ tablet Commonly known  as:  K-DUR,KLOR-CON Take 1 tablet (20 mEq total) by mouth every morning.   predniSONE 20 MG tablet Commonly known as:  DELTASONE Take 4 tablets (80 mg total) by mouth 2 (two) times daily with a meal.   prochlorperazine 10 MG tablet Commonly known as:  COMPAZINE Take 10 mg by mouth every 6 (six) hours as needed for nausea or vomiting.   rosuvastatin 20 MG tablet Commonly known as:  CRESTOR Take 1 tablet (20 mg total) by mouth at bedtime.   sulfamethoxazole-trimethoprim 800-160 MG tablet Commonly known as:  BACTRIM DS,SEPTRA DS Take 1 tablet by mouth 3 (three) times a week.   SYMBICORT 80-4.5 MCG/ACT inhaler Generic drug:  budesonide-formoterol INHALE 2 PUFFS BY MOUTH TWICE DAILY( RINSE MOUTH AFTER USE)   tiZANidine 4 MG tablet Commonly known as:  ZANAFLEX TAKE 1 TABLET(4 MG) BY MOUTH THREE TIMES DAILY   Vitamin D3 50 MCG (2000 UT) capsule Take  2,000 Units by mouth daily.       Allergies  Allergen Reactions  . Bee Venom Anaphylaxis  . Penicillins Itching    Blisters in mouth after dental work Has patient had a PCN reaction causing immediate rash, facial/tongue/throat swelling, SOB or lightheadedness with hypotension: no Has patient had a PCN reaction causing severe rash involving mucus membranes or skin necrosis: No Has patient had a PCN reaction that required hospitalization No Has patient had a PCN reaction occurring within the last 10 years: Yes If all of the above answers are "NO", then may proceed with Cephalosporin use.   . Codeine     Consultations:     Procedures/Studies: Dg Chest 2 View  Result Date: 08/08/2018 CLINICAL DATA:  Hyperglycemia. History of lung cancer. EXAM: CHEST - 2 VIEW COMPARISON:  07/22/2018 CXR, chest CT 07/23/2018 FINDINGS: Normal heart size. Nonaneurysmal thoracic aorta. Median sternotomy sutures are in place. Left-sided port catheter tip terminates in the proximal SVC. No pneumothorax or pulmonary consolidation. No overt pulmonary  edema nor effusion. Patient has known left upper lobe dominant mass partially obscured by the overlying port catheter with adjacent smaller nodule also seen in the left upper lobe. No acute osseous abnormality. IMPRESSION: No active cardiopulmonary disease. Stable known pulmonary lesions in the left upper lobe. Electronically Signed   By: Ashley Royalty M.D.   On: 08/08/2018 13:57   Dg Chest 2 View  Result Date: 07/22/2018 CLINICAL DATA:  Increased shortness of breath.  Left pneumonitis. EXAM: CHEST - 2 VIEW COMPARISON:  CT 07/17/2018. FINDINGS: PowerPort catheter with tip over superior vena cava. Prior median sternotomy. Heart size stable. Left upper lobe ill-defined density and adjacent infiltrate. Small left pleural effusion. Similar findings noted on prior CT of 07/17/2018. This consistent with lung cancer and changes related to treatment. IMPRESSION: 1.  PowerPort noted in stable position. 2. Left upper lung mass and adjacent interstitial changes consistent with known lung cancer and post treatment changes. Small left pleural effusion. Similar findings noted on prior exam. Electronically Signed   By: [place]   On: 07/22/2018 11:57   Dg Chest 2 View  Result Date: 07/17/2018 CLINICAL DATA:  Cough and shortness of breath for 1 week. History of lung cancer. EXAM: CHEST - 2 VIEW COMPARISON:  Radiographs 06/03/2018 FINDINGS: The cardiac silhouette, mediastinal and hilar contours are within normal limits and stable. The left subclavian power port is stable. New left upper lobe interstitial process, possibly drug-induced pneumonitis or interstitial spread of tumor. No focal airspace consolidation or new pulmonary nodules. Small left pleural effusion. IMPRESSION: New coarse interstitial process in the left upper lobe as detailed above. Small left effusion. Electronically Signed   By: Marijo Sanes M.D.   On: 07/17/2018 17:17   Ct Chest W Contrast  Result Date: 07/17/2018 CLINICAL DATA:  Squamous cell  carcinoma of the left upper lobe. Shortness of breath and cough. Patient receiving immunotherapy. EXAM: CT CHEST WITH CONTRAST TECHNIQUE: Multidetector CT imaging of the chest was performed during intravenous contrast administration. CONTRAST:  82m OMNIPAQUE IOHEXOL 300 MG/ML  SOLN COMPARISON:  CT scan 04/30/2018 FINDINGS: Cardiovascular: The heart is normal in size. No pericardial effusion. The aorta is normal in caliber. No dissection. No significant atherosclerotic calcifications. The branch vessels are patent. Minimal scattered coronary artery calcifications. Mediastinum/Nodes: Persistent soft tissue thickening in the anterior mediastinum wrapping around the aortic arch. This is likely residual thymic tissue. No enlarged mediastinal or hilar lymph nodes. The esophagus is grossly normal. Lungs/Pleura: Persistent  nodular mass in the left upper lobe measuring approximately 18.5 x 17.5 mm on image number 43 of series 4. There is a new extensive interstitial process involving the left lung with marked interstitial thickening with a honeycomb type pattern likely drug related pneumonitis. Interstitial spread of tumor would be another possibility. The right lung is not affected. There is also a small to moderate-sized left pleural effusion. Stable underlying emphysematous changes. Upper Abdomen: Stable intra and extrahepatic biliary dilatation likely due to prior cholecystectomy. Musculoskeletal: No significant bony findings. IMPRESSION: 1. New coarse interstitial disease involving the left upper lobe and upper aspect of the left lower lobe. Findings could be due to drug related pneumonitis or interstitial spread of tumor. There is also a new small to moderate-sized left pleural effusion. 2. Persistent nodular left upper lobe lung mass measuring 18.5 x 17.5 mm. 3. No mediastinal or hilar adenopathy. Stable treated matted soft tissue density in the left suprahilar region. Aortic Atherosclerosis (ICD10-I70.0) and  Emphysema (ICD10-J43.9). Electronically Signed   By: Marijo Sanes M.D.   On: 07/17/2018 17:16   Ct Angio Chest Pe W Or Wo Contrast  Result Date: 07/23/2018 CLINICAL DATA:  72 year old female with shortness of breath. Concern for pulmonary embolism. Squamous cell carcinoma of the left upper lobe. Patient receiving immunotherapy. EXAM: CT ANGIOGRAPHY CHEST WITH CONTRAST TECHNIQUE: Multidetector CT imaging of the chest was performed using the standard protocol during bolus administration of intravenous contrast. Multiplanar CT image reconstructions and MIPs were obtained to evaluate the vascular anatomy. CONTRAST:  <See Chart> ISOVUE-370 IOPAMIDOL (ISOVUE-370) INJECTION 76% COMPARISON:  Chest CT dated 07/17/2018 and radiograph dated 07/22/2018 FINDINGS: Cardiovascular: Borderline enlarged cardiomegaly with dilatation of the atria and reflux of contrast from the right atrium into the IVC. No pericardial effusion. The thoracic aorta is unremarkable. There is dilatation of the main pulmonary trunk suggestive of pulmonary hypertension. No CT evidence of pulmonary embolism. Mediastinum/Nodes: A 1.6 x 1.5 cm left suprahilar soft tissue contiguous with the 2 x 2 cm left upper lobe irregular nodule. The left suprahilar soft tissue may represent extension of malignancy into the left hilum or adenopathy. This is similar to the prior CT. No new adenopathy. A top-normal lymph node along the right lower lobe bronchus measures 10 mm in short axis (series 5, image 124) similar to prior CT. Soft tissue thickening in the anterior mediastinum similar to prior CT. The esophagus is grossly unremarkable. No mediastinal fluid collection. Lungs/Pleura: Small left pleural effusion similar or slightly decreased compared to the prior CT. Interval development of a trace right pleural effusion. There is emphysematous changes of the lungs with diffuse interstitial thickening and coarsening in the left upper lobe and superior segment of the  left lower lobe similar to prior CT. Findings may represent an infectious process or pneumonitis or lymphangitic spread of tumor. A 7 x 7 mm nodule noted in the anterior left upper lobe in addition to the dominant nodule. No pneumothorax. The central airways are patent. A small endobronchial nodular focus in the lingula (series 5, image 96) may represent volume averaging artifact with peribronchial soft tissue although endobronchial extension of tumor is not entirely excluded. Upper Abdomen: No acute abnormality. Musculoskeletal: Median sternotomy wires. No acute osseous pathology. Review of the MIP images confirms the above findings. IMPRESSION: 1. No CT evidence of pulmonary embolism. 2. No significant interval change in the dominant and irregular left upper lobe nodule with extension into the left suprahilar region most consistent with known malignancy. 3. Emphysema with interstitial  coarsening of the left upper lobe and superior segment of the left lower lobe as seen on the prior CT possibly related to an infectious process/pneumonitis or carcinomatosis. 4. Small nodular density in the lingular bronchus may represent volume averaging artifact versus endobronchial extension of tumor. 5. Interval development of trace right pleural effusion. 6. Additional findings as above. Electronically Signed   By: Anner Crete M.D.   On: 07/23/2018 05:44      Subjective: Patient is feeling better today.  No nausea or vomiting.  Discharge Exam: Vitals:   08/10/18 2033 08/11/18 0000 08/11/18 0500 08/11/18 0844  BP:   (!) 144/79   Pulse:   87   Resp:   18   Temp:  98.7 F (37.1 C) 98.5 F (36.9 C)   TempSrc:  Oral Oral   SpO2: 96%  99% 94%  Weight:      Height:        General: Pt is alert, awake, not in acute distress Cardiovascular: RRR, S1/S2 +, no rubs, no gallops Respiratory: CTA bilaterally, no wheezing, no rhonchi Abdominal: Soft, NT, ND, bowel sounds + Extremities: no edema, no  cyanosis    The results of significant diagnostics from this hospitalization (including imaging, microbiology, ancillary and laboratory) are listed below for reference.     Microbiology: Recent Results (from the past 240 hour(s))  MRSA PCR Screening     Status: None   Collection Time: 08/08/18  4:40 PM  Result Value Ref Range Status   MRSA by PCR NEGATIVE NEGATIVE Final    Comment:        The GeneXpert MRSA Assay (FDA approved for NASAL specimens only), is one component of a comprehensive MRSA colonization surveillance program. It is not intended to diagnose MRSA infection nor to guide or monitor treatment for MRSA infections. Performed at [facility], [address] [place], [place] [address]      Labs: BNP (last 3 results) Recent Labs    10/28/17 1702  BNP 91.6   Basic Metabolic Panel: Recent Labs  Lab 08/08/18 0858 08/08/18 1247 08/08/18 2054 08/09/18 0434 08/10/18 0414  NA 128* 130*  --  138 135  K 5.7* 4.7 4.2 3.9 4.4  CL 89* 89*  --  100 102  CO2 25 27  --  28 25  GLUCOSE 667* 659*  --  168* 333*  BUN 32* 31*  --  20 19  CREATININE 1.25* 1.06*  --  0.73 0.71  CALCIUM 9.3 9.3  --  8.3* 7.9*   Liver Function Tests: Recent Labs  Lab 08/08/18 0858  AST 18  ALT 24  ALKPHOS 100  BILITOT 0.7  PROT 5.9*  ALBUMIN 3.5   No results for input(s): LIPASE, AMYLASE in the last 168 hours. No results for input(s): AMMONIA in the last 168 hours. CBC: Recent Labs  Lab 08/08/18 0858 08/08/18 1247 08/09/18 0435  WBC 10.4 11.0* 9.2  NEUTROABS 9.6* 10.5*  --   HGB 11.8* 12.7 11.3*  HCT 39.3 41.4 38.0  MCV 89.5 88.7 87.8  PLT 106* 133* 120*   Cardiac Enzymes: No results for input(s): CKTOTAL, CKMB, CKMBINDEX, TROPONINI in the last 168 hours. BNP: Invalid input(s): POCBNP CBG: Recent Labs  Lab 08/10/18 0729 08/10/18 1117 08/10/18 1713 08/10/18 2205 08/11/18 0806  GLUCAP 296* 339* 325* 287* 279*   D-Dimer No results for input(s): DDIMER in the  last 72 hours. Hgb A1c Recent Labs    08/08/18 1247  HGBA1C 9.4*   Lipid Profile  No results for input(s): CHOL, HDL, LDLCALC, TRIG, CHOLHDL, LDLDIRECT in the last 72 hours. Thyroid function studies No results for input(s): TSH, T4TOTAL, T3FREE, THYROIDAB in the last 72 hours.  Invalid input(s): FREET3 Anemia work up No results for input(s): VITAMINB12, FOLATE, FERRITIN, TIBC, IRON, RETICCTPCT in the last 72 hours. Urinalysis    Component Value Date/Time   COLORURINE YELLOW 03/23/2010 1030   APPEARANCEUR CLOUDY (A) 03/23/2010 1030   LABSPEC 1.030 03/23/2010 1030   PHURINE 5.0 03/23/2010 1030   GLUCOSEU NEGATIVE 03/23/2010 1030   HGBUR NEGATIVE 03/23/2010 1030   BILIRUBINUR negative 05/31/2014 1118   KETONESUR 15 (A) 03/23/2010 1030   PROTEINUR negative 05/31/2014 1118   PROTEINUR NEGATIVE 03/23/2010 1030   UROBILINOGEN 0.2 05/31/2014 1118   UROBILINOGEN 1.0 03/23/2010 1030   NITRITE negative 05/31/2014 1118   NITRITE NEGATIVE 03/23/2010 1030   LEUKOCYTESUR Trace 05/31/2014 1118   Sepsis Labs Invalid input(s): PROCALCITONIN,  WBC,  LACTICIDVEN Microbiology Recent Results (from the past 240 hour(s))  MRSA PCR Screening     Status: None   Collection Time: 08/08/18  4:40 PM  Result Value Ref Range Status   MRSA by PCR NEGATIVE NEGATIVE Final    Comment:        The GeneXpert MRSA Assay (FDA approved for NASAL specimens only), is one component of a comprehensive MRSA colonization surveillance program. It is not intended to diagnose MRSA infection nor to guide or monitor treatment for MRSA infections. Performed at [facility], [address], [place], [place] [address]      Time coordinating discharge: 27mns  SIGNED:   JKathie Dike MD  [facility] 08/11/2018, 10:01 AM Pager   If 7PM-7AM, please contact night-coverage [email or web address] Password [email or web address]

## 2018-08-12 ENCOUNTER — Ambulatory Visit (HOSPITAL_COMMUNITY): Payer: Self-pay | Admitting: Hematology

## 2018-08-14 ENCOUNTER — Other Ambulatory Visit (HOSPITAL_COMMUNITY): Payer: Self-pay | Admitting: Nurse Practitioner

## 2018-08-14 ENCOUNTER — Telehealth: Payer: Self-pay

## 2018-08-14 DIAGNOSIS — C3492 Malignant neoplasm of unspecified part of left bronchus or lung: Secondary | ICD-10-CM

## 2018-08-14 MED ORDER — PREDNISONE 20 MG PO TABS: 80.0000 mg | ORAL_TABLET | Freq: Two times a day (BID) | ORAL | 0 refills | Status: AC

## 2018-08-14 NOTE — Telephone Encounter (Signed)
Called patient back. No answer left message. 2nd attempt.

## 2018-08-14 NOTE — Telephone Encounter (Signed)
Pt returning your call --I have made the TOC appt

## 2018-08-14 NOTE — Telephone Encounter (Signed)
[id number]  Attempted to contact patient to complete [facility] telephone call. No answer. Left message requesting call back. Will try again later. 1st attempt

## 2018-08-14 NOTE — Telephone Encounter (Signed)
[id number] Attempted to contact patient to complete [facility] telephone call. No answer. Left message requesting call back. Will try again later. 2nd attempt

## 2018-08-15 ENCOUNTER — Other Ambulatory Visit: Payer: Self-pay | Admitting: Pharmacist

## 2018-08-15 NOTE — Patient Outreach (Signed)
[facility]) Care Management  [place]   08/15/2018  [name] March 15, 1946 [id number]  Reason for referral: Medication Reconciliation Post Discharge  Referral source: [employer]  PMHx includes but not limited to: new onset steroid-induced diabetes, hx lung cancer on immunotherapy with recent pneumonitis treated with high dose steroids, COPD, HTN, chronic pain, anxiety and depression  Contacted patient for medication review; left HIPAA compliant message for her to return my call at her earliest convenience.   Plan - If I have not heard back, will follow up in 2-3 business days.   Catie Darnelle Maffucci, PharmD PGY2 Ambulatory Care Pharmacy Resident, [facility] Phone: [phone]

## 2018-08-18 NOTE — Telephone Encounter (Signed)
Transition Care Management Follow-up Telephone Call   Date discharged?  08/11/2018              How have you been since you were released from the hospital? My sugars have been "whacking" out. They sent me home on insulin and metformin   Do you understand why you were in the hospital? My sugar ran up it was over 600   Do you understand the discharge instructions? yes   Where were you discharged to? home   Items Reviewed:  Medications reviewed: yes  Allergies reviewed: yes  Dietary changes reviewed: diabetic diet  Referrals reviewed: no new referrals    Functional Questionnaire:   Activities of Daily Living (ADLs): able to perform alone    Any transportation issues/concerns?: no   Any patient concerns? no   Confirmed importance and date/time of follow-up visits scheduled yes. 08/20/2018     Confirmed with patient if condition begins to worsen call PCP or go to the ER.  Patient was given the office number and encouraged to call back with question or concerns. Yes with verbal understanding

## 2018-08-18 NOTE — Telephone Encounter (Signed)
TOC completed 

## 2018-08-19 ENCOUNTER — Encounter (HOSPITAL_COMMUNITY): Payer: Self-pay | Admitting: Hematology

## 2018-08-19 ENCOUNTER — Inpatient Hospital Stay (HOSPITAL_COMMUNITY): Payer: PPO | Attending: Hematology | Admitting: Hematology

## 2018-08-19 VITALS — BP 160/66 | HR 115 | Temp 98.2°F | Resp 20 | Wt 219.5 lb

## 2018-08-19 DIAGNOSIS — Z801 Family history of malignant neoplasm of trachea, bronchus and lung: Secondary | ICD-10-CM | POA: Diagnosis not present

## 2018-08-19 DIAGNOSIS — Z800 Family history of malignant neoplasm of digestive organs: Secondary | ICD-10-CM | POA: Diagnosis not present

## 2018-08-19 DIAGNOSIS — M129 Arthropathy, unspecified: Secondary | ICD-10-CM | POA: Diagnosis not present

## 2018-08-19 DIAGNOSIS — R5383 Other fatigue: Secondary | ICD-10-CM | POA: Diagnosis not present

## 2018-08-19 DIAGNOSIS — C3412 Malignant neoplasm of upper lobe, left bronchus or lung: Secondary | ICD-10-CM | POA: Insufficient documentation

## 2018-08-19 DIAGNOSIS — E669 Obesity, unspecified: Secondary | ICD-10-CM | POA: Diagnosis not present

## 2018-08-19 DIAGNOSIS — E785 Hyperlipidemia, unspecified: Secondary | ICD-10-CM

## 2018-08-19 DIAGNOSIS — Z8042 Family history of malignant neoplasm of prostate: Secondary | ICD-10-CM | POA: Insufficient documentation

## 2018-08-19 DIAGNOSIS — I10 Essential (primary) hypertension: Secondary | ICD-10-CM | POA: Diagnosis not present

## 2018-08-19 DIAGNOSIS — J449 Chronic obstructive pulmonary disease, unspecified: Secondary | ICD-10-CM

## 2018-08-19 DIAGNOSIS — D509 Iron deficiency anemia, unspecified: Secondary | ICD-10-CM

## 2018-08-19 DIAGNOSIS — C3492 Malignant neoplasm of unspecified part of left bronchus or lung: Secondary | ICD-10-CM

## 2018-08-19 DIAGNOSIS — Z7982 Long term (current) use of aspirin: Secondary | ICD-10-CM

## 2018-08-19 DIAGNOSIS — K219 Gastro-esophageal reflux disease without esophagitis: Secondary | ICD-10-CM | POA: Diagnosis not present

## 2018-08-19 DIAGNOSIS — Z9221 Personal history of antineoplastic chemotherapy: Secondary | ICD-10-CM | POA: Diagnosis not present

## 2018-08-19 DIAGNOSIS — Z808 Family history of malignant neoplasm of other organs or systems: Secondary | ICD-10-CM

## 2018-08-19 DIAGNOSIS — Z87891 Personal history of nicotine dependence: Secondary | ICD-10-CM | POA: Diagnosis not present

## 2018-08-19 DIAGNOSIS — F319 Bipolar disorder, unspecified: Secondary | ICD-10-CM | POA: Insufficient documentation

## 2018-08-19 DIAGNOSIS — F17210 Nicotine dependence, cigarettes, uncomplicated: Secondary | ICD-10-CM | POA: Diagnosis not present

## 2018-08-19 DIAGNOSIS — M549 Dorsalgia, unspecified: Secondary | ICD-10-CM | POA: Diagnosis not present

## 2018-08-19 DIAGNOSIS — Z79899 Other long term (current) drug therapy: Secondary | ICD-10-CM | POA: Insufficient documentation

## 2018-08-19 NOTE — Patient Instructions (Signed)
[place] at [facility] Discharge Instructions Follow up next week with labs    Thank you for choosing [place] at [facility] to provide your oncology and hematology care.  To afford each patient quality time with our provider, please arrive at least 15 minutes before your scheduled appointment time.   If you have a lab appointment with the [facility] please come in thru the  Main Entrance and check in at the main information desk  You need to re-schedule your appointment should you arrive 10 or more minutes late.  We strive to give you quality time with our providers, and arriving late affects you and other patients whose appointments are after yours.  Also, if you no show three or more times for appointments you may be dismissed from the clinic at the providers discretion.     Again, thank you for choosing [facility].  Our hope is that these requests will decrease the amount of time that you wait before being seen by our physicians.       _____________________________________________________________  Should you have questions after your visit to [facility], please contact our office at [phone] between the hours of 8:00 a.m. and 4:30 p.m.  Voicemails left after 4:00 p.m. will not be returned until the following business day.  For prescription refill requests, have your pharmacy contact our office and allow 72 hours.    Cancer Center Support Programs:   > Cancer Support Group  2nd Tuesday of the month 1pm-2pm, Journey Room

## 2018-08-19 NOTE — Assessment & Plan Note (Signed)
1.  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  She was evaluated by Dr. Roxan Hockey on 01/08/2018 and felt not to be a surgical candidate. - Started on chemoradiation therapy with weekly carboplatin and paclitaxel on 02/11/2018. - She had trouble with swallowing and retrosternal pain over the weekend.  [facility] mouthwash did not help.  Dr.Yanagihara gave her lidocaine 15 mL every 4 hours which is helping.  She is able to eat well now. - She completed chemotherapy on 03/19/2018 and radiation therapy on 03/25/2018.  She has recovered well from side effects of radiation and chemo. - I have discussed the results of the CT scan of the chest dated 04/30/2018 which showed central left upper lobe lung mass has decreased in size to 2.1 x 1.6 cm, from 3.4 x 3 cm.  There is also decreased extrinsic narrowing of the segmental left upper lobe bronchi.  Clustered satellite nodularity in the posterior left upper lobe has decreased.  No new areas were seen. -As she had partial response, durvalumab consolidation every 2 weeks for 12 months was recommended. - Durvalumab consolidation started on 05/07/2018, last dose on 05/21/2018.   - CT chest on 07/20/2018 shows pneumonitis of the left lung.  She was started on prednisone 1 mg/kg, rounded off 200 mg daily on 07/18/2018.   - Prednisone was increased to 200 mg on 07/22/2018 due to no response.  She was hospitalized from 07/22/2018  through 07/25/2018 and was treated with antibiotics and steroids. - She was hospitalized from 08/08/2018 through 08/11/2018 with hyperglycemia and was diagnosed with diabetes. - She was started on Lantus 50 units at bedtime.  She is also taking metformin for 100 mg twice daily.  She reports her fasting sugars averaging 200. - I have decreased her prednisone to 60 mg twice daily, which she started yesterday evening. -I have recommended further dose reduction to 2 tablets twice daily (40 mg twice daily) starting on 08/21/2018. - If her fasting blood sugars drop to around 100, we will consider cutting back on the dose of Lantus. - I will reevaluate her in 1 week.  Her breathing and her lungs sound good today.  She is continuing DuoNeb nebulizers every 6 hours. -She will continue Bactrim 3 times a week for PJP prophylaxis.  2.  Family history: The 2 of her sisters died of stomach cancer.  Father died of lung cancer.  Maternal aunt died of thyroid cancer.  Maternal grandfather had cancer in the bone.  Primary is unknown.  We will refer her to our geneticist.  3.  Iron deficiency state: - She will continue iron tablet with stool softeners.  Last ferritin was 14.

## 2018-08-19 NOTE — Progress Notes (Signed)
[place], [place] [address]   CLINIC:  Medical Oncology/Hematology  PCP:  Fayrene Helper, MD [address] [place] [address] [phone]   REASON FOR VISIT: Follow-up for squamous cell lung cancer AND Pneumonitis   CURRENT THERAPY: Imfinzi is on hold AND prednisone  BRIEF ONCOLOGIC HISTORY:    Squamous cell lung cancer, left ([place])   10/28/2017 Imaging    CT scan of the chest shows left hilar mass measuring 3.4 x 2.9 cm, multiple soft tissue masses in the left upper lobe, largest measuring 1.6 cm  MRI of the brain on 10/31/2017 shows dural based focus of contrast enhancement along the superior left convexity, meningioma favored as it was present on MRI in 2009    10/30/2017 Initial Biopsy    Bronchoscopy and biopsy consistent with squamous cell cancer of the lung    11/08/2017 Family History    2 sisters died of stomach cancer Father died of lung cancer in a smoker Maternal aunt died of thyroid cancer Maternal grandfather had cancer spread to the bone    02/05/2018 - 03/25/2018 Chemotherapy    The patient had palonosetron (ALOXI) injection 0.25 mg, 0.25 mg, Intravenous,  Once, 6 of 6 cycles Administration: 0.25 mg (02/11/2018), 0.25 mg (02/18/2018), 0.25 mg (02/25/2018), 0.25 mg (03/04/2018), 0.25 mg (03/12/2018), 0.25 mg (03/19/2018) CARBOplatin (PARAPLATIN) 220 mg in sodium chloride 0.9 % 250 mL chemo infusion, 220 mg (100 % of original dose 215.8 mg), Intravenous,  Once, 6 of 6 cycles Dose modification:   (original dose 215.8 mg, Cycle 1),   (original dose 215.8 mg, Cycle 2), 215.8 mg (original dose 215.8 mg, Cycle 3),   (original dose 215.8 mg, Cycle 4) Administration: 220 mg (02/11/2018), 220 mg (02/18/2018), 220 mg (02/25/2018), 220 mg (03/04/2018), 220 mg (03/12/2018), 220 mg (03/19/2018) PACLitaxel (TAXOL) 96 mg in sodium chloride 0.9 % 250 mL chemo infusion (</= 58m/m2), 45 mg/m2 = 96 mg, Intravenous,  Once, 6 of 6  cycles Administration: 96 mg (02/11/2018), 96 mg (02/18/2018), 96 mg (02/25/2018), 96 mg (03/04/2018), 96 mg (03/12/2018), 96 mg (03/19/2018)  for chemotherapy treatment.      Malignant neoplasm of upper lobe of left lung ([facility]   01/22/2018 Initial Diagnosis    [place] of left lung ([facility]    05/06/2018 -  Chemotherapy    The patient had durvalumab (IMFINZI) 1,000 mg in sodium chloride 0.9 % 100 mL chemo infusion, 10.2 mg/kg = 980 mg, Intravenous,  Once, 4 of 6 cycles Administration: 1,000 mg (05/07/2018), 1,000 mg (05/21/2018), 1,000 mg (06/19/2018), 1,000 mg (07/03/2018)  for chemotherapy treatment.       INTERVAL HISTORY:  Ms. [age]verdure89y.o. female returns for routine follow-up for squamous cell lung cancer. She is here today with her son. She was pushed up in a wheelchair. She becomes SOB with exertion and she is fatigued throughout the day. She has started taking insulin after her recent hospitalization. Denies any nausea, vomiting, or diarrhea. Denies any new pains. Had not noticed any recent bleeding such as epistaxis, hematuria or hematochezia. Denies recent chest pain on exertion, pre-syncopal episodes, or palpitations. Denies any numbness or tingling in hands or feet. Denies any recent fevers or infections. She reports her appetite at 100%.    REVIEW OF SYSTEMS:  Review of Systems  Constitutional: Positive for fatigue.  Respiratory: Positive for cough and shortness of breath.   All other systems reviewed and are negative.  PAST MEDICAL/SURGICAL HISTORY:  Past Medical History:  Diagnosis Date  . Anxiety   . Arthritis   . Bipolar disorder ([place])   . Chronic back pain   . COPD (chronic obstructive pulmonary disease) ([place])   . Family history of lung cancer   . Family history of prostate cancer   . Family history of stomach cancer   . 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 GERD (gastroesophageal reflux disease) 2000  .  Hyperlipidemia 1995  . Hypertension 1995  . Nicotine addiction   . Obesity    Past Surgical History:  Procedure Laterality Date  . BACK SURGERY  1999  . BRONCHIAL BRUSHINGS Left 10/30/2017   Procedure: BRONCHIAL BRUSHINGS;  Surgeon: [name], MD;  [facility];  Service: Cardiopulmonary;  Laterality: Left;  . BRONCHIAL WASHINGS Left 10/30/2017   Procedure: BRONCHIAL WASHINGS;  Surgeon: [name], MD;  Location: [facility];  Service: Cardiopulmonary;  Laterality: Left;  . CHOLECYSTECTOMY  2001  . COLONOSCOPY N/A 02/02/2016   Procedure: COLONOSCOPY;  Surgeon: [name], MD;  Location: [facility];  Service: Endoscopy;  Laterality: N/A;  930  . FLEXIBLE BRONCHOSCOPY Bilateral 10/30/2017   Procedure: FLEXIBLE BRONCHOSCOPY;  Surgeon: [name], MD;  Location: [facility];  Service: Cardiopulmonary;  Laterality: Bilateral;  . INCISIONAL HERNIA REPAIR  August 17, 2008  . left inguinal hernia herniorrhapy  1980  . PORTACATH PLACEMENT Left 02/03/2018   Procedure: INSERTION PORT-A-CATH;  Surgeon: [name], MD;  Location: [facility];  Service: General;  Laterality: Left;  . removal of thmoma  03/27/2010   Dr. Arlyce Dice   . SPINE SURGERY    . TONSILLECTOMY    . TOTAL ABDOMINAL HYSTERECTOMY W/ BILATERAL SALPINGOOPHORECTOMY  1984     SOCIAL HISTORY:  Social History   Socioeconomic History  . Marital status: Married    Spouse name: Not on file  . Number of children: Not on file  . Years of education: Not on file  . Highest education level: Not on file  Occupational History  . 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 Financial resource strain: Not hard at all  . Food insecurity:    Worry: Never true    Inability: Never true  . Transportation needs:    Medical: No    Non-medical: No  Tobacco Use  . Smoking status: Former Smoker    Packs/day: 1.50    Years: 54.00    Pack years: 81.00    Types:  Cigarettes    Last attempt to quit: 08/20/2017    Years since quitting: 0.9  . Smokeless tobacco: Never Used  Substance and Sexual Activity  . Alcohol use: No    Alcohol/week: 0.0 standard drinks  . Drug use: No  . Sexual activity: Not Currently  Lifestyle  . Physical activity:    Days per week: 0 days    Minutes per session: 0 min  . Stress: Only a little  Relationships  . Social connections:    Talks on phone: More than three times a week    Gets together: More than three times a week    Attends religious service: More than 4 times per year    Active member of club or organization: No    Attends meetings of clubs or organizations: Never    Relationship status: Married  . Intimate partner violence:  Fear of current or ex partner: No    Emotionally abused: No    Physically abused: No    Forced sexual activity: No  Other Topics Concern  . Not on file  Social History Narrative   Pt had 2 stillborns     FAMILY HISTORY:  Family History  Problem Relation Age of Onset  . Heart disease Mother        enlarged  . Diabetes Mother   . Hypertension Mother   . Cancer Father        throat cancer - smoker  . Hypertension Father   . Coronary artery disease Father   . Diabetes Sister        x3  . Asthma Sister   . Lung cancer Sister 25       d. 48; smoker  . Kidney disease Brother   . Stomach cancer Sister        dx in her 31s-50s  . Stroke Brother   . Prostate cancer Brother 44       pat 1/2 brother  . Thyroid cancer Maternal Aunt        dx and d. in her 59s  . Bone cancer Maternal Uncle   . Bone cancer Maternal Grandfather   . Cancer Cousin        NOS - had a swollen stomach    CURRENT MEDICATIONS:  Outpatient Encounter Medications as of 08/19/2018  Medication Sig  . albuterol (PROAIR HFA) 108 (90 Base) MCG/ACT inhaler INHALE 2 PUFFS BY MOUTH EVERY 6 HOURS IF NEEDED (Patient taking differently: Inhale 2 puffs into the lungs every 6 (six) hours as needed for wheezing  or shortness of breath. )  . albuterol (PROVENTIL) (2.5 MG/3ML) 0.083% nebulizer solution USE 1 VIAL IN NEBULIZER EVERY 6 HOURS AS NEEDED.  Marland Kitchen ALPRAZolam (XANAX) 1 MG tablet Take 1 tablet (1 mg total) by mouth 2 (two) times daily as needed for anxiety.  Marland Kitchen aspirin (ASPIRIN LOW DOSE) 81 MG EC tablet Take 81 mg by mouth daily.    . blood glucose meter kit and supplies KIT Dispense based on patient and insurance preference. Use up to four times daily as directed. (FOR ICD-9 250.00, 250.01).  . Cholecalciferol (VITAMIN D3) 50 MCG (2000 UT) capsule Take 2,000 Units by mouth daily.  Marland Kitchen diltiazem (CARDIZEM CD) 120 MG 24 hr capsule Take 1 capsule (120 mg total) by mouth every morning.  . diphenhydrAMINE (BENADRYL) 25 mg capsule Take 25-50 mg by mouth at bedtime as needed for sleep.  Marland Kitchen docusate sodium (COLACE) 100 MG capsule Take 100-200 mg by mouth 2 (two) times daily as needed for mild constipation or moderate constipation.   Marland Kitchen FLUoxetine (PROZAC) 20 MG capsule Take 1 capsule (20 mg total) by mouth every morning.  . furosemide (LASIX) 20 MG tablet Take 2 tablets (40 mg total) by mouth every morning.  Marland Kitchen guaiFENesin (MUCINEX) 600 MG 12 hr tablet Take 1 tablet (600 mg total) by mouth 2 (two) times daily.  Marland Kitchen HYDROcodone-acetaminophen (NORCO/VICODIN) 5-325 MG tablet Take one tablet three times daily for back pain  . Insulin Glargine (LANTUS) 100 UNIT/ML Solostar Pen Inject 50 Units into the skin at bedtime.  . Insulin Pen Needle 31G X 8 MM MISC Use as directed  . ipratropium (ATROVENT) 0.02 % nebulizer solution INHALE ONE VIAL VIA NEBULIZER EVERY 6 HOURS AS NEEDED.  Marland Kitchen lactulose (CHRONULAC) 10 GM/15ML solution Take 15-34m at bedtime every night to assist with moving bowels.  Titrate down  if having multiple bowel movements.  Marland Kitchen lisinopril (PRINIVIL,ZESTRIL) 10 MG tablet Take 1 tablet (10 mg total) by mouth every morning.  . metFORMIN (GLUCOPHAGE) 500 MG tablet Take 1 tablet (500 mg total) by mouth 2 (two) times  daily with a meal.  . omeprazole (PRILOSEC) 40 MG capsule Take 1 capsule (40 mg total) by mouth every morning.  . potassium chloride SA (K-DUR,KLOR-CON) 20 MEQ tablet Take 1 tablet (20 mEq total) by mouth every morning.  . predniSONE (DELTASONE) 20 MG tablet Take 4 tablets (80 mg total) by mouth 2 (two) times daily with a meal. (Patient taking differently: Take 60 mg by mouth 2 (two) times daily with a meal. )  . rosuvastatin (CRESTOR) 20 MG tablet Take 1 tablet (20 mg total) by mouth at bedtime.  . sulfamethoxazole-trimethoprim (BACTRIM DS,SEPTRA DS) 800-160 MG tablet Take 1 tablet by mouth 3 (three) times a week.  . SYMBICORT 80-4.5 MCG/ACT inhaler INHALE 2 PUFFS BY MOUTH TWICE DAILY( RINSE MOUTH AFTER USE)  . tiZANidine (ZANAFLEX) 4 MG tablet TAKE 1 TABLET(4 MG) BY MOUTH THREE TIMES DAILY  . lidocaine-prilocaine (EMLA) cream Apply a quarter size amount to port site 1 hour prior to chemo. Do not rub in. Cover with plastic wrap. (Patient not taking: Reported on 08/19/2018)  . prochlorperazine (COMPAZINE) 10 MG tablet Take 10 mg by mouth every 6 (six) hours as needed for nausea or vomiting.   No facility-administered encounter medications on file as of 08/19/2018.     ALLERGIES:  Allergies  Allergen Reactions  . Bee Venom Anaphylaxis  . Penicillins Itching    Blisters in mouth after dental work Has patient had a PCN reaction causing immediate rash, facial/tongue/throat swelling, SOB or lightheadedness with hypotension: no Has patient had a PCN reaction causing severe rash involving mucus membranes or skin necrosis: No Has patient had a PCN reaction that required hospitalization No Has patient had a PCN reaction occurring within the last 10 years: Yes If all of the above answers are "NO", then may proceed with Cephalosporin use.   . Codeine      PHYSICAL EXAM:  ECOG Performance status: 1  Vitals:   08/19/18 0935  BP: (!) 160/66  Pulse: (!) 115  Resp: 20  Temp: 98.2 F (36.8 C)    SpO2: 96%   Filed Weights   08/19/18 0935  Weight: 219 lb 8 oz (99.6 kg)    Physical Exam Constitutional:      Appearance: Normal appearance. She is normal weight.  Cardiovascular:     Rate and Rhythm: Normal rate and regular rhythm.     Heart sounds: Normal heart sounds.  Pulmonary:     Effort: Pulmonary effort is normal.     Breath sounds: Normal breath sounds.  Musculoskeletal: Normal range of motion.  Skin:    General: Skin is warm and dry.  Neurological:     Mental Status: She is alert and oriented to person, place, and time. Mental status is at baseline.  Psychiatric:        Mood and Affect: Mood normal.        Behavior: Behavior normal.        Thought Content: Thought content normal.        Judgment: Judgment normal.   Lungs are clear to auscultation.  Extremities has 1+ edema bilaterally.   LABORATORY DATA:  I have reviewed the labs as listed.  CBC    Component Value Date/Time   WBC 9.2 08/09/2018 0435   RBC  4.33 08/09/2018 0435   HGB 11.3 (L) 08/09/2018 0435   HCT 38.0 08/09/2018 0435   PLT 120 (L) 08/09/2018 0435   MCV 87.8 08/09/2018 0435   MCH 26.1 08/09/2018 0435   MCHC 29.7 (L) 08/09/2018 0435   RDW 16.0 (H) 08/09/2018 0435   LYMPHSABS 0.3 (L) 08/08/2018 1247   MONOABS 0.3 08/08/2018 1247   EOSABS 0.0 08/08/2018 1247   BASOSABS 0.0 08/08/2018 1247   CMP Latest Ref Rng & Units 08/10/2018 08/09/2018 08/08/2018  Glucose 70 - 99 mg/dL 333(H) 168(H) -  BUN 8 - 23 mg/dL 19 20 -  Creatinine 0.44 - 1.00 mg/dL 0.71 0.73 -  Sodium 135 - 145 mmol/L 135 138 -  Potassium 3.5 - 5.1 mmol/L 4.4 3.9 4.2  Chloride 98 - 111 mmol/L 102 100 -  CO2 22 - 32 mmol/L 25 28 -  Calcium 8.9 - 10.3 mg/dL 7.9(L) 8.3(L) -  Total Protein 6.5 - 8.1 g/dL - - -  Total Bilirubin 0.3 - 1.2 mg/dL - - -  Alkaline Phos 38 - 126 U/L - - -  AST 15 - 41 U/L - - -  ALT 0 - 44 U/L - - -       DIAGNOSTIC IMAGING:  I have independently reviewed the scans and discussed with the  patient.   I have reviewed Francene Finders, NP's note and agree with the documentation.  I personally performed a face-to-face visit, made revisions and my assessment and plan is as follows.    ASSESSMENT & PLAN:   Squamous cell lung cancer, left ([facility]) 1.  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  She was evaluated by Dr. Roxan Hockey on 01/08/2018 and felt not to be a surgical candidate. - Started on chemoradiation therapy with weekly carboplatin and paclitaxel on 02/11/2018. - She had trouble with swallowing and retrosternal pain over the weekend.  [facility] mouthwash did not help.  Dr.Yanagihara gave her lidocaine 15 mL every 4 hours which is helping.  She is able to eat well now. - She completed chemotherapy on 03/19/2018 and radiation therapy on 03/25/2018.  She has recovered well from side effects of radiation and chemo. - I have discussed the results of the CT scan of the chest dated 04/30/2018 which showed central left upper lobe lung mass has decreased in size to 2.1 x 1.6 cm, from 3.4 x 3 cm.  There is also decreased extrinsic narrowing of the segmental left upper lobe bronchi.  Clustered satellite nodularity in the posterior left upper lobe has decreased.  No new areas were seen. -As she had partial response, durvalumab consolidation every 2 weeks for 12 months was recommended. - Durvalumab consolidation started on 05/07/2018, last dose on  05/21/2018.   - CT chest on 07/20/2018 shows pneumonitis of the left lung.  She was started on prednisone 1 mg/kg, rounded off 200 mg daily on 07/18/2018.   - Prednisone was increased to 200 mg on 07/22/2018 due to no response.  She was hospitalized from 07/22/2018 through 07/25/2018 and was treated with antibiotics and steroids. - She was hospitalized from 08/08/2018 through 08/11/2018 with hyperglycemia and was diagnosed with diabetes. - She was started on Lantus 50 units at bedtime.  She is also taking metformin for 100 mg twice daily.  She reports her fasting sugars averaging 200. - I have decreased her prednisone to 60 mg twice daily, which she started yesterday evening. -I have recommended further dose reduction to 2 tablets twice daily (40 mg twice daily) starting on 08/21/2018. - If her fasting blood sugars drop to around 100, we will consider cutting back on the dose of Lantus. - I will reevaluate her in 1 week.  Her breathing and her lungs sound good today.  She is continuing DuoNeb nebulizers every 6 hours. -She will continue Bactrim 3 times a week for PJP prophylaxis.  2.  Family history: The 2 of her sisters died of stomach cancer.  Father died of lung cancer.  Maternal aunt died of thyroid cancer.  Maternal grandfather had cancer in the bone.  Primary is unknown.  We will refer her to our geneticist.  3.  Iron deficiency state: - She will continue iron tablet with stool softeners.  Last ferritin was 14.       Orders placed this encounter:  Orders Placed This Encounter  Procedures  . Magnesium  . CBC with Differential/Platelet  . Comprehensive metabolic panel  . TSH      Derek Jack, [place] [phone]

## 2018-08-20 ENCOUNTER — Ambulatory Visit (INDEPENDENT_AMBULATORY_CARE_PROVIDER_SITE_OTHER): Payer: PPO | Admitting: Family Medicine

## 2018-08-20 ENCOUNTER — Encounter: Payer: Self-pay | Admitting: Family Medicine

## 2018-08-20 VITALS — BP 138/72 | HR 110 | Resp 15 | Ht 63.0 in | Wt 217.0 lb

## 2018-08-20 DIAGNOSIS — Z09 Encounter for follow-up examination after completed treatment for conditions other than malignant neoplasm: Secondary | ICD-10-CM | POA: Diagnosis not present

## 2018-08-20 DIAGNOSIS — R296 Repeated falls: Secondary | ICD-10-CM | POA: Diagnosis not present

## 2018-08-20 DIAGNOSIS — Z794 Long term (current) use of insulin: Secondary | ICD-10-CM | POA: Diagnosis not present

## 2018-08-20 DIAGNOSIS — R7309 Other abnormal glucose: Secondary | ICD-10-CM

## 2018-08-20 DIAGNOSIS — E1165 Type 2 diabetes mellitus with hyperglycemia: Secondary | ICD-10-CM | POA: Diagnosis not present

## 2018-08-20 DIAGNOSIS — IMO0001 Reserved for inherently not codable concepts without codable children: Secondary | ICD-10-CM

## 2018-08-20 MED ORDER — METFORMIN HCL 1000 MG PO TABS: 1000.0000 mg | ORAL_TABLET | Freq: Two times a day (BID) | ORAL | 3 refills | Status: AC

## 2018-08-20 NOTE — Patient Instructions (Signed)
F/U in 3 weeks with blood sugar log, call if you need me before  Increase in metformin to 1000 mg two times daily  You are referred to diabetic educator  Goal for fasting blood sugar ranges from 90 to 130 and 2 hours after any meal or at bedtime should be between 130 to 170.   Test 4 times daily, before breakfast, 2 hrs after lunch, before dinner and at bedtimeYou are in our prayers

## 2018-08-21 ENCOUNTER — Ambulatory Visit: Payer: Self-pay | Admitting: Family Medicine

## 2018-08-27 ENCOUNTER — Inpatient Hospital Stay (HOSPITAL_COMMUNITY): Payer: PPO

## 2018-08-27 ENCOUNTER — Encounter (HOSPITAL_COMMUNITY): Payer: Self-pay | Admitting: Hematology

## 2018-08-27 ENCOUNTER — Inpatient Hospital Stay (HOSPITAL_BASED_OUTPATIENT_CLINIC_OR_DEPARTMENT_OTHER): Payer: PPO | Admitting: Hematology

## 2018-08-27 ENCOUNTER — Other Ambulatory Visit: Payer: Self-pay

## 2018-08-27 VITALS — BP 148/53 | HR 104 | Temp 98.1°F | Resp 18 | Wt 211.1 lb

## 2018-08-27 DIAGNOSIS — K219 Gastro-esophageal reflux disease without esophagitis: Secondary | ICD-10-CM

## 2018-08-27 DIAGNOSIS — R197 Diarrhea, unspecified: Secondary | ICD-10-CM

## 2018-08-27 DIAGNOSIS — M129 Arthropathy, unspecified: Secondary | ICD-10-CM | POA: Diagnosis not present

## 2018-08-27 DIAGNOSIS — D509 Iron deficiency anemia, unspecified: Secondary | ICD-10-CM

## 2018-08-27 DIAGNOSIS — E785 Hyperlipidemia, unspecified: Secondary | ICD-10-CM | POA: Diagnosis not present

## 2018-08-27 DIAGNOSIS — F319 Bipolar disorder, unspecified: Secondary | ICD-10-CM

## 2018-08-27 DIAGNOSIS — E669 Obesity, unspecified: Secondary | ICD-10-CM

## 2018-08-27 DIAGNOSIS — Z808 Family history of malignant neoplasm of other organs or systems: Secondary | ICD-10-CM

## 2018-08-27 DIAGNOSIS — M549 Dorsalgia, unspecified: Secondary | ICD-10-CM

## 2018-08-27 DIAGNOSIS — C3412 Malignant neoplasm of upper lobe, left bronchus or lung: Secondary | ICD-10-CM

## 2018-08-27 DIAGNOSIS — Z9221 Personal history of antineoplastic chemotherapy: Secondary | ICD-10-CM

## 2018-08-27 DIAGNOSIS — Z800 Family history of malignant neoplasm of digestive organs: Secondary | ICD-10-CM

## 2018-08-27 DIAGNOSIS — Z7982 Long term (current) use of aspirin: Secondary | ICD-10-CM

## 2018-08-27 DIAGNOSIS — R5383 Other fatigue: Secondary | ICD-10-CM

## 2018-08-27 DIAGNOSIS — Z87891 Personal history of nicotine dependence: Secondary | ICD-10-CM

## 2018-08-27 DIAGNOSIS — I10 Essential (primary) hypertension: Secondary | ICD-10-CM | POA: Diagnosis not present

## 2018-08-27 DIAGNOSIS — J449 Chronic obstructive pulmonary disease, unspecified: Secondary | ICD-10-CM

## 2018-08-27 DIAGNOSIS — Z8042 Family history of malignant neoplasm of prostate: Secondary | ICD-10-CM

## 2018-08-27 DIAGNOSIS — C3492 Malignant neoplasm of unspecified part of left bronchus or lung: Secondary | ICD-10-CM

## 2018-08-27 DIAGNOSIS — Z79899 Other long term (current) drug therapy: Secondary | ICD-10-CM

## 2018-08-27 DIAGNOSIS — Z801 Family history of malignant neoplasm of trachea, bronchus and lung: Secondary | ICD-10-CM

## 2018-08-27 LAB — COMPREHENSIVE METABOLIC PANEL
ALT: 31 U/L (ref 0–44)
AST: 23 U/L (ref 15–41)
Albumin: 3.5 g/dL (ref 3.5–5.0)
Alkaline Phosphatase: 45 U/L (ref 38–126)
Anion gap: 15 (ref 5–15)
BUN: 47 mg/dL — ABNORMAL HIGH (ref 8–23)
CO2: 24 mmol/L (ref 22–32)
Calcium: 9.5 mg/dL (ref 8.9–10.3)
Chloride: 91 mmol/L — ABNORMAL LOW (ref 98–111)
Creatinine, Ser: 1.53 mg/dL — ABNORMAL HIGH (ref 0.44–1.00)
GFR calc Af Amer: 40 mL/min — ABNORMAL LOW (ref >60)
GFR calc non Af Amer: 34 mL/min — ABNORMAL LOW (ref >60)
Glucose, Bld: 139 mg/dL — ABNORMAL HIGH (ref 70–99)
Potassium: 5.5 mmol/L — ABNORMAL HIGH (ref 3.5–5.1)
Sodium: 130 mmol/L — ABNORMAL LOW (ref 135–145)
Total Bilirubin: 0.9 mg/dL (ref 0.3–1.2)
Total Protein: 6.4 g/dL — ABNORMAL LOW (ref 6.5–8.1)

## 2018-08-27 LAB — CBC WITH DIFFERENTIAL/PLATELET
Abs Immature Granulocytes: 0.65 10*3/uL — ABNORMAL HIGH (ref 0.00–0.07)
BASOS ABS: 0 10*3/uL (ref 0.0–0.1)
Basophils Relative: 0 %
Eosinophils Absolute: 0.1 10*3/uL (ref 0.0–0.5)
Eosinophils Relative: 1 %
HCT: 43.3 % (ref 36.0–46.0)
Hemoglobin: 13.1 g/dL (ref 12.0–15.0)
Immature Granulocytes: 5 %
Lymphocytes Relative: 7 %
Lymphs Abs: 0.9 10*3/uL (ref 0.7–4.0)
MCH: 26.9 pg (ref 26.0–34.0)
MCHC: 30.3 g/dL (ref 30.0–36.0)
MCV: 88.9 fL (ref 80.0–100.0)
Monocytes Absolute: 0.7 10*3/uL (ref 0.1–1.0)
Monocytes Relative: 6 %
NRBC: 0.2 % (ref 0.0–0.2)
Neutro Abs: 9.6 10*3/uL — ABNORMAL HIGH (ref 1.7–7.7)
Neutrophils Relative %: 81 %
Platelets: 217 10*3/uL (ref 150–400)
RBC: 4.87 MIL/uL (ref 3.87–5.11)
RDW: 16.6 % — ABNORMAL HIGH (ref 11.5–15.5)
WBC Morphology: INCREASED
WBC: 11.9 10*3/uL — ABNORMAL HIGH (ref 4.0–10.5)

## 2018-08-27 LAB — C DIFFICILE QUICK SCREEN W PCR REFLEX
C Diff antigen: NEGATIVE
C Diff interpretation: NOT DETECTED
C Diff toxin: NEGATIVE

## 2018-08-27 LAB — MAGNESIUM: Magnesium: 1.6 mg/dL — ABNORMAL LOW (ref 1.7–2.4)

## 2018-08-27 LAB — TSH: TSH: 1.573 u[IU]/mL (ref 0.350–4.500)

## 2018-08-27 MED ORDER — SODIUM CHLORIDE 0.9 % IV SOLN: INTRAVENOUS | Status: DC

## 2018-08-27 MED ORDER — HEPARIN SOD (PORK) LOCK FLUSH 100 UNIT/ML IV SOLN
500.0000 [IU] | Freq: Once | INTRAVENOUS | Status: AC
  Administered 2018-08-27: 500 [IU] via INTRAVENOUS

## 2018-08-27 MED ORDER — SODIUM CHLORIDE 0.9% FLUSH
10.0000 mL | INTRAVENOUS | Status: DC | PRN
  Administered 2018-08-27: 10 mL via INTRAVENOUS
  Filled 2018-08-27: qty 10

## 2018-08-27 MED ORDER — SODIUM CHLORIDE 0.9 % IV SOLN
INTRAVENOUS | Status: AC
  Administered 2018-08-27: 12:00:00 via INTRAVENOUS

## 2018-08-27 NOTE — Patient Instructions (Signed)
[place] at [facility] Discharge Instructions  Received hydration today. Follow-up as scheduled. Call clinic for any questions or concerns   Thank you for choosing [employer] at [facility] to provide your oncology and hematology care.  To afford each patient quality time with our provider, please arrive at least 15 minutes before your scheduled appointment time.   If you have a lab appointment with the [place] please come in thru the  Main Entrance and check in at the main information desk  You need to re-schedule your appointment should you arrive 10 or more minutes late.  We strive to give you quality time with our providers, and arriving late affects you and other patients whose appointments are after yours.  Also, if you no show three or more times for appointments you may be dismissed from the clinic at the providers discretion.     Again, thank you for choosing [facility].  Our hope is that these requests will decrease the amount of time that you wait before being seen by our physicians.       _____________________________________________________________  Should you have questions after your visit to [facility], please contact our office at [phone] between the hours of 8:00 a.m. and 4:30 p.m.  Voicemails left after 4:00 p.m. will not be returned until the following business day.  For prescription refill requests, have your pharmacy contact our office and allow 72 hours.    Cancer Center Support Programs:   > Cancer Support Group  2nd Tuesday of the month 1pm-2pm, Journey Room

## 2018-08-27 NOTE — Assessment & Plan Note (Signed)
1.  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  She was evaluated by Dr. Roxan Hockey on 01/08/2018 and felt not to be a surgical candidate. - Started on chemoradiation therapy with weekly carboplatin and paclitaxel on 02/11/2018. - She had trouble with swallowing and retrosternal pain over the weekend.  [facility] mouthwash did not help.  Dr.Yanagihara gave her lidocaine 15 mL every 4 hours which is helping.  She is able to eat well now. - She completed chemotherapy on 03/19/2018 and radiation therapy on 03/25/2018.  She has recovered well from side effects of radiation and chemo. - I have discussed the results of the CT scan of the chest dated 04/30/2018 which showed central left upper lobe lung mass has decreased in size to 2.1 x 1.6 cm, from 3.4 x 3 cm.  There is also decreased extrinsic narrowing of the segmental left upper lobe bronchi.  Clustered satellite nodularity in the posterior left upper lobe has decreased.  No new areas were seen. -As she had partial response, durvalumab consolidation every 2 weeks for 12 months was recommended. - Durvalumab consolidation started on 05/07/2018, last dose on 05/21/2018.   - CT chest on 07/20/2018 shows pneumonitis of the left lung.  She was started on prednisone 1 mg/kg, rounded off 200 mg daily on 07/18/2018.   - Prednisone was increased to 200 mg on 07/22/2018 due to no response.  She was hospitalized from 07/22/2018  through 07/25/2018 and was treated with antibiotics and steroids. - She was hospitalized from 08/08/2018 through 08/11/2018 with hyperglycemia and was diagnosed with diabetes. - She was started on Lantus 50 units at bedtime.  She is also taking metformin for 100 mg twice daily.  She reports her fasting sugars averaging 200. - She is currently taking prednisone 60 mg twice daily.  This was lowered on 08/18/2018. -She started having diarrhea from 08/24/2018, 5-6 times per day, associated with abdominal cramping.  Denied any nausea or vomiting. - Today her creatinine went up to 1.53.  Potassium was 5.5.  We will give her 1 L of normal saline. -I have told her to hold off on Lasix and potassium supplements. - I have told her to cut back on prednisone to 40 mg twice daily. -We will check her stool for C. difficile and stool culture.  She will use Imodium as needed after she gives the stool sample. -We will see her back in 1 week for follow-up.  2.  Family history: The 2 of her sisters died of stomach cancer.  Father died of lung cancer.  Maternal aunt died of thyroid cancer.  Maternal grandfather had cancer in the bone.  Primary is unknown.  We will refer her to our geneticist.  3.  Iron deficiency state: - She will continue iron tablet with stool softeners.  Last ferritin was 14.

## 2018-08-27 NOTE — Patient Instructions (Addendum)
[place] at [facility] Discharge Instructions  You may get Imodium from your local drug store. Take 2 pills at first then take 1 pill after each watery bowel movement. DO NOT exceed over 8 pills daily.    Thank you for choosing [place] at [facility] to provide your oncology and hematology care.  To afford each patient quality time with our provider, please arrive at least 15 minutes before your scheduled appointment time.   If you have a lab appointment with the [facility] please come in thru the  Main Entrance and check in at the main information desk  You need to re-schedule your appointment should you arrive 10 or more minutes late.  We strive to give you quality time with our providers, and arriving late affects you and other patients whose appointments are after yours.  Also, if you no show three or more times for appointments you may be dismissed from the clinic at the providers discretion.     Again, thank you for choosing [facility].  Our hope is that these requests will decrease the amount of time that you wait before being seen by our physicians.       _____________________________________________________________  Should you have questions after your visit to [facility], please contact our office at [phone] between the hours of 8:00 a.m. and 4:30 p.m.  Voicemails left after 4:00 p.m. will not be returned until the following business day.  For prescription refill requests, have your pharmacy contact our office and allow 72 hours.    Cancer Center Support Programs:   > Cancer Support Group  2nd Tuesday of the month 1pm-2pm, Journey Room

## 2018-08-27 NOTE — Progress Notes (Signed)
[place] S Schellinger tolerated hydration well without complaints or incident. VSS upon discharge. Pt discharged via wheelchair in satisfactory condition accompanied by her son

## 2018-08-27 NOTE — Progress Notes (Signed)
[name], [place] [address]   CLINIC:  Medical Oncology/Hematology  PCP:  Fayrene Helper, MD [address] [place] [address] [phone]   REASON FOR VISIT: Follow-up for squamous cell lung cancer ANDPneumonitis  CURRENT THERAPY:Imfinzi is on hold AND prednisone 60 mg twice daily.  BRIEF ONCOLOGIC HISTORY:    Squamous cell lung cancer, left ([place])   10/28/2017 Imaging    CT scan of the chest shows left hilar mass measuring 3.4 x 2.9 cm, multiple soft tissue masses in the left upper lobe, largest measuring 1.6 cm  MRI of the brain on 10/31/2017 shows dural based focus of contrast enhancement along the superior left convexity, meningioma favored as it was present on MRI in 2009    10/30/2017 Initial Biopsy    Bronchoscopy and biopsy consistent with squamous cell cancer of the lung    11/08/2017 Family History    2 sisters died of stomach cancer Father died of lung cancer in a smoker Maternal aunt died of thyroid cancer Maternal grandfather had cancer spread to the bone    02/05/2018 - 03/25/2018 Chemotherapy    The patient had palonosetron (ALOXI) injection 0.25 mg, 0.25 mg, Intravenous,  Once, 6 of 6 cycles Administration: 0.25 mg (02/11/2018), 0.25 mg (02/18/2018), 0.25 mg (02/25/2018), 0.25 mg (03/04/2018), 0.25 mg (03/12/2018), 0.25 mg (03/19/2018) CARBOplatin (PARAPLATIN) 220 mg in sodium chloride 0.9 % 250 mL chemo infusion, 220 mg (100 % of original dose 215.8 mg), Intravenous,  Once, 6 of 6 cycles Dose modification:   (original dose 215.8 mg, Cycle 1),   (original dose 215.8 mg, Cycle 2), 215.8 mg (original dose 215.8 mg, Cycle 3),   (original dose 215.8 mg, Cycle 4) Administration: 220 mg (02/11/2018), 220 mg (02/18/2018), 220 mg (02/25/2018), 220 mg (03/04/2018), 220 mg (03/12/2018), 220 mg (03/19/2018) PACLitaxel (TAXOL) 96 mg in sodium chloride 0.9 % 250 mL chemo infusion (</= 46m/m2), 45 mg/m2 = 96 mg, Intravenous,  Once, 6 of  6 cycles Administration: 96 mg (02/11/2018), 96 mg (02/18/2018), 96 mg (02/25/2018), 96 mg (03/04/2018), 96 mg (03/12/2018), 96 mg (03/19/2018)  for chemotherapy treatment.      Malignant neoplasm of upper lobe of left lung ([facility]   01/22/2018 Initial Diagnosis    Malignant neoplasm of upper lobe of left lung ([facility]    05/06/2018 -  Chemotherapy    The patient had durvalumab (IMFINZI) 1,000 mg in sodium chloride 0.9 % 100 mL chemo infusion, 10.2 mg/kg = 980 mg, Intravenous,  Once, 4 of 6 cycles Administration: 1,000 mg (05/07/2018), 1,000 mg (05/21/2018), 1,000 mg (06/19/2018), 1,000 mg (07/03/2018)  for chemotherapy treatment.        INTERVAL HISTORY:  [name] female returns for routine follow-up for squamous cell lung cancer ANDPneumonitis. She is here today with her family. She is having severe diarrhea about 4-5 times daily. She is having abdominal cramps. She is still forcing herself to eat but is having diarrhea almost immediately after. She is trying to stay hydrated with water. She feels very weak and she fell yesterday. She is still SOB with exertion. Denies any nausea or vomiting.  Had not noticed any recent bleeding such as epistaxis, hematuria or hematochezia. Denies recent chest pain on exertion, shortness of breath on minimal exertion, pre-syncopal episodes, or palpitations. Denies any numbness or tingling in hands or feet. Denies any recent fevers, infections, or recent hospitalizations. She reports her appetite at 50% and she reports no  energy.    REVIEW OF SYSTEMS:  Review of Systems  Constitutional: Positive for fatigue.  Respiratory: Positive for shortness of breath.   Gastrointestinal: Positive for abdominal pain and diarrhea.  Neurological: Positive for extremity weakness.  All other systems reviewed and are negative.    PAST MEDICAL/SURGICAL HISTORY:  Past Medical History:  Diagnosis Date  . Anxiety   . Arthritis   . Bipolar disorder ([place])   . Chronic back pain     . COPD (chronic obstructive pulmonary disease) ([place])   . Family history of lung cancer   . Family history of prostate cancer   . Family history of stomach cancer   . 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 GERD (gastroesophageal reflux disease) 2000  . Hyperlipidemia 1995  . Hypertension 1995  . Nicotine addiction   . Obesity    Past Surgical History:  Procedure Laterality Date  . BACK SURGERY  1999  . BRONCHIAL BRUSHINGS Left 10/30/2017   Procedure: BRONCHIAL BRUSHINGS;  Surgeon: [name], MD;  Location: [facility];  Service: Cardiopulmonary;  Laterality: Left;  . BRONCHIAL WASHINGS Left 10/30/2017   Procedure: BRONCHIAL WASHINGS;  Surgeon: [name], MD;  Location: [facility];  Service: Cardiopulmonary;  Laterality: Left;  . CHOLECYSTECTOMY  2001  . COLONOSCOPY N/A 02/02/2016   Procedure: COLONOSCOPY;  Surgeon: [name], MD;  Location: [facility];  Service: Endoscopy;  Laterality: N/A;  930  . FLEXIBLE BRONCHOSCOPY Bilateral 10/30/2017   Procedure: FLEXIBLE BRONCHOSCOPY;  Surgeon: [name], MD;  Location: [facility];  Service: Cardiopulmonary;  Laterality: Bilateral;  . INCISIONAL HERNIA REPAIR  August 17, 2008  . left inguinal hernia herniorrhapy  1980  . PORTACATH PLACEMENT Left 02/03/2018   Procedure: INSERTION PORT-A-CATH;  Surgeon: [name], MD;  Location: [facility] OR[facility];  Service: General;  Laterality: Left;  . removal of thmoma  03/27/2010   Dr. Arlyce Dice   . SPINE SURGERY    . TONSILLECTOMY    . TOTAL ABDOMINAL HYSTERECTOMY W/ BILATERAL SALPINGOOPHORECTOMY  1984     SOCIAL HISTORY:  Social History   Socioeconomic History  . Marital status: Married    Spouse name: Not on file  . Number of children: Not on file  . Years of education: Not on file  . Highest education level: Not on file  Occupational History  . 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  Financial resource strain: Not hard at all  . Food insecurity:    Worry: Never true    Inability: Never true  . Transportation needs:    Medical: No    Non-medical: No  Tobacco Use  . Smoking status: Former Smoker    Packs/day: 1.50    Years: 54.00    Pack years: 81.00    Types: Cigarettes    Last attempt to quit: 08/20/2017    Years since quitting: 1.0  . Smokeless tobacco: Never Used  Substance and Sexual Activity  . Alcohol use: No    Alcohol/week: 0.0 standard drinks  . Drug use: No  . Sexual activity: Not Currently  Lifestyle  . Physical activity:    Days per week: 0 days    Minutes per session: 0 min  . Stress: Only a little  Relationships  . Social connections:    Talks on phone: More than three times a week    Gets together:  More than three times a week    Attends religious service: More than 4 times per year    Active member of club or organization: No    Attends meetings of clubs or organizations: Never    Relationship status: Married  . Intimate partner violence:    Fear of current or ex partner: No    Emotionally abused: No    Physically abused: No    Forced sexual activity: No  Other Topics Concern  . Not on file  Social History Narrative   Pt had 2 stillborns     FAMILY HISTORY:  Family History  Problem Relation Age of Onset  . Heart disease Mother        enlarged  . Diabetes Mother   . Hypertension Mother   . Cancer Father        throat cancer - smoker  . Hypertension Father   . Coronary artery disease Father   . Diabetes Sister        x3  . Asthma Sister   . Lung cancer Sister 85       d. 71; smoker  . Kidney disease Brother   . Stomach cancer Sister        dx in her 70s-50s  . Stroke Brother   . Prostate cancer Brother 23       pat 1/2 brother  . Thyroid cancer Maternal Aunt        dx and d. in her 64s  . Bone cancer Maternal Uncle   . Bone cancer Maternal Grandfather   . Cancer Cousin        NOS - had a swollen stomach     CURRENT MEDICATIONS:  Outpatient Encounter Medications as of 08/27/2018  Medication Sig  . albuterol (PROAIR HFA) 108 (90 Base) MCG/ACT inhaler INHALE 2 PUFFS BY MOUTH EVERY 6 HOURS IF NEEDED (Patient taking differently: Inhale 2 puffs into the lungs every 6 (six) hours as needed for wheezing or shortness of breath. )  . albuterol (PROVENTIL) (2.5 MG/3ML) 0.083% nebulizer solution USE 1 VIAL IN NEBULIZER EVERY 6 HOURS AS NEEDED.  Marland Kitchen ALPRAZolam (XANAX) 1 MG tablet Take 1 tablet (1 mg total) by mouth 2 (two) times daily as needed for anxiety.  Marland Kitchen aspirin (ASPIRIN LOW DOSE) 81 MG EC tablet Take 81 mg by mouth daily.    . blood glucose meter kit and supplies KIT Dispense based on patient and insurance preference. Use up to four times daily as directed. (FOR ICD-9 250.00, 250.01).  . Cholecalciferol (VITAMIN D3) 50 MCG (2000 UT) capsule Take 2,000 Units by mouth daily.  Marland Kitchen diltiazem (CARDIZEM CD) 120 MG 24 hr capsule Take 1 capsule (120 mg total) by mouth every morning.  . diphenhydrAMINE (BENADRYL) 25 mg capsule Take 25-50 mg by mouth at bedtime as needed for sleep.  Marland Kitchen FLUoxetine (PROZAC) 20 MG capsule Take 1 capsule (20 mg total) by mouth every morning.  . furosemide (LASIX) 20 MG tablet Take 2 tablets (40 mg total) by mouth every morning.  Marland Kitchen guaiFENesin (MUCINEX) 600 MG 12 hr tablet Take 1 tablet (600 mg total) by mouth 2 (two) times daily.  Marland Kitchen HYDROcodone-acetaminophen (NORCO/VICODIN) 5-325 MG tablet Take one tablet three times daily for back pain  . Insulin Glargine (LANTUS) 100 UNIT/ML Solostar Pen Inject 50 Units into the skin at bedtime.  . Insulin Pen Needle 31G X 8 MM MISC Use as directed  . ipratropium (ATROVENT) 0.02 % nebulizer solution INHALE ONE VIAL VIA NEBULIZER  EVERY 6 HOURS AS NEEDED.  Marland Kitchen lactulose (CHRONULAC) 10 GM/15ML solution Take 15-63m at bedtime every night to assist with moving bowels.  Titrate down if having multiple bowel movements.  . lidocaine-prilocaine (EMLA) cream  Apply a quarter size amount to port site 1 hour prior to chemo. Do not rub in. Cover with plastic wrap.  . lisinopril (PRINIVIL,ZESTRIL) 10 MG tablet Take 1 tablet (10 mg total) by mouth every morning.  . metFORMIN (GLUCOPHAGE) 1000 MG tablet Take 1 tablet (1,000 mg total) by mouth 2 (two) times daily with a meal.  . omeprazole (PRILOSEC) 40 MG capsule Take 1 capsule (40 mg total) by mouth every morning.  . potassium chloride SA (K-DUR,KLOR-CON) 20 MEQ tablet Take 1 tablet (20 mEq total) by mouth every morning.  . predniSONE (DELTASONE) 20 MG tablet Take 4 tablets (80 mg total) by mouth 2 (two) times daily with a meal. (Patient taking differently: Take 60 mg by mouth 2 (two) times daily with a meal. )  . prochlorperazine (COMPAZINE) 10 MG tablet Take 10 mg by mouth every 6 (six) hours as needed for nausea or vomiting.  . rosuvastatin (CRESTOR) 20 MG tablet Take 1 tablet (20 mg total) by mouth at bedtime.  . sulfamethoxazole-trimethoprim (BACTRIM DS,SEPTRA DS) 800-160 MG tablet Take 1 tablet by mouth 3 (three) times a week.  . SYMBICORT 80-4.5 MCG/ACT inhaler INHALE 2 PUFFS BY MOUTH TWICE DAILY( RINSE MOUTH AFTER USE)  . tiZANidine (ZANAFLEX) 4 MG tablet TAKE 1 TABLET(4 MG) BY MOUTH THREE TIMES DAILY  . celecoxib (CELEBREX) 400 MG capsule TK ONE C PO ONCE D  . docusate sodium (COLACE) 100 MG capsule Take 100-200 mg by mouth 2 (two) times daily as needed for mild constipation or moderate constipation.    No facility-administered encounter medications on file as of 08/27/2018.     ALLERGIES:  Allergies  Allergen Reactions  . Bee Venom Anaphylaxis  . Penicillins Itching    Blisters in mouth after dental work Has patient had a PCN reaction causing immediate rash, facial/tongue/throat swelling, SOB or lightheadedness with hypotension: no Has patient had a PCN reaction causing severe rash involving mucus membranes or skin necrosis: No Has patient had a PCN reaction that required hospitalization  No Has patient had a PCN reaction occurring within the last 10 years: Yes If all of the above answers are "NO", then may proceed with Cephalosporin use.   . Codeine      PHYSICAL EXAM:  ECOG Performance status: 1  Vitals:   08/27/18 1000  BP: (!) 148/53  Pulse: (!) 104  Resp: 18  Temp: 98.1 F (36.7 C)  SpO2: 98%   Filed Weights   08/27/18 1000  Weight: 211 lb 2 oz (95.8 kg)    Physical Exam Constitutional:      Appearance: Normal appearance. She is normal weight.  Cardiovascular:     Rate and Rhythm: Normal rate and regular rhythm.     Heart sounds: Normal heart sounds.  Pulmonary:     Effort: Pulmonary effort is normal.     Breath sounds: Normal breath sounds.  Musculoskeletal: Normal range of motion.  Skin:    General: Skin is warm and dry.  Neurological:     Mental Status: She is alert and oriented to person, place, and time. Mental status is at baseline.  Psychiatric:        Mood and Affect: Mood normal.        Behavior: Behavior normal.  Thought Content: Thought content normal.        Judgment: Judgment normal.      LABORATORY DATA:  I have reviewed the labs as listed.  CBC    Component Value Date/Time   WBC 11.9 (H) 08/27/2018 0945   RBC 4.87 08/27/2018 0945   HGB 13.1 08/27/2018 0945   HCT 43.3 08/27/2018 0945   PLT 217 08/27/2018 0945   MCV 88.9 08/27/2018 0945   MCH 26.9 08/27/2018 0945   MCHC 30.3 08/27/2018 0945   RDW 16.6 (H) 08/27/2018 0945   LYMPHSABS 0.9 08/27/2018 0945   MONOABS 0.7 08/27/2018 0945   EOSABS 0.1 08/27/2018 0945   BASOSABS 0.0 08/27/2018 0945   CMP Latest Ref Rng & Units 08/27/2018 08/10/2018 08/09/2018  Glucose 70 - 99 mg/dL 139(H) 333(H) 168(H)  BUN 8 - 23 mg/dL 47(H) 19 20  Creatinine 0.44 - 1.00 mg/dL 1.53(H) 0.71 0.73  Sodium 135 - 145 mmol/L 130(L) 135 138  Potassium 3.5 - 5.1 mmol/L 5.5(H) 4.4 3.9  Chloride 98 - 111 mmol/L 91(L) 102 100  CO2 22 - 32 mmol/L _0 Calcium 8.9 - 10.3 mg/dL 9.5  7.9(L) 8.3(L)  Total Protein 6.5 - 8.1 g/dL 6.4(L) - -  Total Bilirubin 0.3 - 1.2 mg/dL 0.9 - -  Alkaline Phos 38 - 126 U/L 45 - -  AST 15 - 41 U/L 23 - -  ALT 0 - 44 U/L 31 - -       DIAGNOSTIC IMAGING:  I have independently reviewed the scans and discussed with the patient.   I have reviewed Francene Finders, NP's note and agree with the documentation.  I personally performed a face-to-face visit, made revisions and my assessment and plan is as follows.    ASSESSMENT & PLAN:   Squamous cell lung cancer, left ([facility]) 1.  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  She was evaluated by Dr. Roxan Hockey on 01/08/2018 and felt not to be a surgical candidate. - Started on chemoradiation therapy with weekly carboplatin and paclitaxel on 02/11/2018. - She had trouble with swallowing and retrosternal pain over the weekend.  [facility] mouthwash did not help.  Dr.Yanagihara gave her lidocaine 15 mL every 4 hours which is helping.  She is able to eat well now. - She completed chemotherapy on 03/19/2018 and radiation therapy on 03/25/2018.  She has recovered well from side effects of radiation and chemo. - I have discussed the results of the CT scan of the chest dated 04/30/2018 which showed central left upper lobe lung mass has decreased in size to 2.1 x 1.6 cm, from 3.4 x 3 cm.  There is also decreased extrinsic narrowing of the segmental left upper lobe  bronchi.  Clustered satellite nodularity in the posterior left upper lobe has decreased.  No new areas were seen. -As she had partial response, durvalumab consolidation every 2 weeks for 12 months was recommended. - Durvalumab consolidation started on 05/07/2018, last dose on 05/21/2018.   -  CT chest on 07/20/2018 shows pneumonitis of the left lung.  She was started on prednisone 1 mg/kg, rounded off 200 mg daily on 07/18/2018.   - Prednisone was increased to 200 mg on 07/22/2018 due to no response.  She was hospitalized from 07/22/2018 through 07/25/2018 and was treated with antibiotics and steroids. - She was hospitalized from 08/08/2018 through 08/11/2018 with hyperglycemia and was diagnosed with diabetes. - She was started on Lantus 50 units at bedtime.  She is also taking metformin for 100 mg twice daily.  She reports her fasting sugars averaging 200. - She is currently taking prednisone 60 mg twice daily.  This was lowered on 08/18/2018. -She started having diarrhea from 08/24/2018, 5-6 times per day, associated with abdominal cramping.  Denied any nausea or vomiting. - Today her creatinine went up to 1.53.  Potassium was 5.5.  We will give her 1 L of normal saline. -I have told her to hold off on Lasix and potassium supplements. - I have told her to cut back on prednisone to 40 mg twice daily. -We will check her stool for C. difficile and stool culture.  She will use Imodium as needed after she gives the stool sample. -We will see her back in 1 week for follow-up.  2.  Family history: The 2 of her sisters died of stomach cancer.  Father died of lung cancer.  Maternal aunt died of thyroid cancer.  Maternal grandfather had cancer in the bone.  Primary is unknown.  We will refer her to our geneticist.  3.  Iron deficiency state: - She will continue iron tablet with stool softeners.  Last ferritin was 14.       Orders placed this encounter:  Orders Placed This Encounter  Procedures  . Stool  culture  . Magnesium  . CBC with Differential/Platelet  . Comprehensive metabolic panel  . TSH      Derek Jack, [place] [phone]

## 2018-08-28 ENCOUNTER — Telehealth: Payer: Self-pay | Admitting: Family Medicine

## 2018-08-28 ENCOUNTER — Encounter: Payer: Self-pay | Admitting: Family Medicine

## 2018-08-28 ENCOUNTER — Other Ambulatory Visit: Payer: Self-pay

## 2018-08-28 DIAGNOSIS — IMO0001 Reserved for inherently not codable concepts without codable children: Secondary | ICD-10-CM | POA: Insufficient documentation

## 2018-08-28 DIAGNOSIS — R296 Repeated falls: Secondary | ICD-10-CM | POA: Insufficient documentation

## 2018-08-28 DIAGNOSIS — M549 Dorsalgia, unspecified: Principal | ICD-10-CM

## 2018-08-28 DIAGNOSIS — E1165 Type 2 diabetes mellitus with hyperglycemia: Secondary | ICD-10-CM

## 2018-08-28 DIAGNOSIS — R29898 Other symptoms and signs involving the musculoskeletal system: Secondary | ICD-10-CM

## 2018-08-28 DIAGNOSIS — Z794 Long term (current) use of insulin: Secondary | ICD-10-CM

## 2018-08-28 DIAGNOSIS — M5430 Sciatica, unspecified side: Secondary | ICD-10-CM

## 2018-08-28 NOTE — Assessment & Plan Note (Signed)
Reports recurrent falls due to lower extremity weakness, and also experiences marked dyspnea with minimal activity due to lung disease. Walker with seat will improve both her safety and quality of life

## 2018-08-28 NOTE — Telephone Encounter (Signed)
Spouse states pt is falling often, two timessince Sunday, feet give out, needs walker with seat, please assist in getting this, thanks

## 2018-08-28 NOTE — Telephone Encounter (Signed)
OV note and demographics sent to [facility]

## 2018-08-28 NOTE — Assessment & Plan Note (Signed)
Patient in for follow up of recent hospitalization. Discharge summary, and laboratory and radiology data are reviewed, and any questions or concerns about recent hospitalization are discussed. Specific issues requiring follow up are specifically addressed.  

## 2018-08-28 NOTE — Progress Notes (Signed)
   [name]     MRN: [id number]      DOB: [birth year]   HPI [name] is here for follow up of recent hospitalization from 12/27 to 12/30/202. New diagnosis of uncontrolled diabetes, triggered by high dose prednisone which she is requiring  She still is having FBG around 200 and is very involved in both testing sugars and following diet to get her disease controlled as she battles lung cancer, and unfortunately, most recently the passing of a sibling. C/o imbalance , unsteady gait, with lower extremity weakness and recurrent falls. Gets short of breath easily, and needs a walker with a seat foor improved safety and mobility C/o excessive thirst and urination and fatigue Tearful , sad, crying and overwhelmed because of the recent loss of her sister ROS Denies recent fever or chills. Denies sinus pressure, nasal congestion, ear pain or sore throat. Denies chest congestion, productive cough or wheezing. Denies chest pains, palpitations and leg swelling Denies abdominal pain, nausea, vomiting,diarrhea or constipation.   Denies dysuria, frequency, hesitancy or incontinence.  Denies skin break down or rash.   PE  BP 138/72   Pulse (!) 110   Resp 15   Ht 5\' 3"  (1.6 m)   Wt 217 lb (98.4 kg)   SpO2 91%   BMI 38.44 kg/m   Patient alert and oriented and in  cardiopulmonary distress.  HEENT: No facial asymmetry, EOMI,   oropharynx pink and moist.  Neck supple no JVD, no mass.  Chest: decreased though adequate air entry, no crackles, scattered wheezes.  CVS: S1, S2 no murmurs, no S3.Regular rate.  ABD: Soft non tender.   Ext: No edema  MS: decreased  ROM spine, shoulders, hips and knees.  Skin: Intact, no ulcerations or rash noted.  Psych: Good eye contact, sad, crying due to recent loss of her sibling CNS: CN 2-12 intact, power,  normal throughout.no focal deficits noted.   [facility] discharge follow-up Patient in for follow up of recent  hospitalization. Discharge summary, and laboratory and radiology data are reviewed, and any questions or concerns about recent hospitalization are discussed. Specific issues requiring follow up are specifically addressed.   Diabetes mellitus, insulin dependent (IDDM), uncontrolled ([place]) Educated re need to test and record  4 times daily and to call in if blood sugar remains elevated as insulin dose will be up titrated Referred to diabetic educator, low threshold for Endo referral depending on how she is doing  Recurrent falls while walking Reports recurrent falls due to lower extremity weakness, and also experiences marked dyspnea with minimal activity due to lung disease. Walker with seat will improve both her safety and quality of life

## 2018-08-28 NOTE — Telephone Encounter (Signed)
Order given to husband. Will fax note to [place] once complete

## 2018-08-28 NOTE — Assessment & Plan Note (Signed)
Educated re need to test and record  4 times daily and to call in if blood sugar remains elevated as insulin dose will be up titrated Referred to diabetic educator, low threshold for Endo referral depending on how she is doing

## 2018-08-29 DIAGNOSIS — J449 Chronic obstructive pulmonary disease, unspecified: Secondary | ICD-10-CM | POA: Diagnosis not present

## 2018-08-31 ENCOUNTER — Encounter (HOSPITAL_COMMUNITY): Payer: Self-pay | Admitting: *Deleted

## 2018-08-31 ENCOUNTER — Other Ambulatory Visit: Payer: Self-pay

## 2018-08-31 ENCOUNTER — Emergency Department (HOSPITAL_COMMUNITY): Payer: PPO

## 2018-08-31 ENCOUNTER — Inpatient Hospital Stay (HOSPITAL_COMMUNITY): Payer: PPO | Admitting: Certified Registered Nurse Anesthetist

## 2018-08-31 ENCOUNTER — Encounter (HOSPITAL_COMMUNITY): Admission: EM | Disposition: E | Payer: Self-pay | Source: Home / Self Care | Attending: Internal Medicine

## 2018-08-31 ENCOUNTER — Inpatient Hospital Stay (HOSPITAL_COMMUNITY)
Admission: EM | Admit: 2018-08-31 | Discharge: 2018-09-13 | DRG: 853 | Disposition: E | Payer: PPO | Attending: Internal Medicine | Admitting: Internal Medicine

## 2018-08-31 DIAGNOSIS — J9621 Acute and chronic respiratory failure with hypoxia: Secondary | ICD-10-CM | POA: Diagnosis not present

## 2018-08-31 DIAGNOSIS — Z7982 Long term (current) use of aspirin: Secondary | ICD-10-CM

## 2018-08-31 DIAGNOSIS — F419 Anxiety disorder, unspecified: Secondary | ICD-10-CM | POA: Diagnosis present

## 2018-08-31 DIAGNOSIS — E785 Hyperlipidemia, unspecified: Secondary | ICD-10-CM | POA: Diagnosis not present

## 2018-08-31 DIAGNOSIS — Z9221 Personal history of antineoplastic chemotherapy: Secondary | ICD-10-CM

## 2018-08-31 DIAGNOSIS — R000 Tachycardia, unspecified: Secondary | ICD-10-CM | POA: Diagnosis not present

## 2018-08-31 DIAGNOSIS — Z90722 Acquired absence of ovaries, bilateral: Secondary | ICD-10-CM

## 2018-08-31 DIAGNOSIS — F319 Bipolar disorder, unspecified: Secondary | ICD-10-CM | POA: Diagnosis present

## 2018-08-31 DIAGNOSIS — S31109A Unspecified open wound of abdominal wall, unspecified quadrant without penetration into peritoneal cavity, initial encounter: Secondary | ICD-10-CM | POA: Diagnosis not present

## 2018-08-31 DIAGNOSIS — Z9889 Other specified postprocedural states: Secondary | ICD-10-CM

## 2018-08-31 DIAGNOSIS — N179 Acute kidney failure, unspecified: Secondary | ICD-10-CM | POA: Diagnosis not present

## 2018-08-31 DIAGNOSIS — J9600 Acute respiratory failure, unspecified whether with hypoxia or hypercapnia: Secondary | ICD-10-CM | POA: Diagnosis not present

## 2018-08-31 DIAGNOSIS — E11649 Type 2 diabetes mellitus with hypoglycemia without coma: Secondary | ICD-10-CM | POA: Diagnosis not present

## 2018-08-31 DIAGNOSIS — C7801 Secondary malignant neoplasm of right lung: Secondary | ICD-10-CM | POA: Diagnosis present

## 2018-08-31 DIAGNOSIS — Z7951 Long term (current) use of inhaled steroids: Secondary | ICD-10-CM

## 2018-08-31 DIAGNOSIS — G9340 Encephalopathy, unspecified: Secondary | ICD-10-CM | POA: Diagnosis not present

## 2018-08-31 DIAGNOSIS — Z66 Do not resuscitate: Secondary | ICD-10-CM | POA: Diagnosis not present

## 2018-08-31 DIAGNOSIS — R579 Shock, unspecified: Secondary | ICD-10-CM | POA: Diagnosis not present

## 2018-08-31 DIAGNOSIS — E876 Hypokalemia: Secondary | ICD-10-CM | POA: Diagnosis not present

## 2018-08-31 DIAGNOSIS — E1165 Type 2 diabetes mellitus with hyperglycemia: Secondary | ICD-10-CM

## 2018-08-31 DIAGNOSIS — Z6841 Body Mass Index (BMI) 40.0 and over, adult: Secondary | ICD-10-CM | POA: Diagnosis not present

## 2018-08-31 DIAGNOSIS — R52 Pain, unspecified: Secondary | ICD-10-CM | POA: Diagnosis not present

## 2018-08-31 DIAGNOSIS — F329 Major depressive disorder, single episode, unspecified: Secondary | ICD-10-CM | POA: Diagnosis present

## 2018-08-31 DIAGNOSIS — Z87891 Personal history of nicotine dependence: Secondary | ICD-10-CM | POA: Diagnosis not present

## 2018-08-31 DIAGNOSIS — E872 Acidosis: Secondary | ICD-10-CM | POA: Diagnosis present

## 2018-08-31 DIAGNOSIS — A419 Sepsis, unspecified organism: Secondary | ICD-10-CM | POA: Diagnosis not present

## 2018-08-31 DIAGNOSIS — D696 Thrombocytopenia, unspecified: Secondary | ICD-10-CM | POA: Diagnosis not present

## 2018-08-31 DIAGNOSIS — I5032 Chronic diastolic (congestive) heart failure: Secondary | ICD-10-CM | POA: Diagnosis not present

## 2018-08-31 DIAGNOSIS — E870 Hyperosmolality and hypernatremia: Secondary | ICD-10-CM | POA: Diagnosis not present

## 2018-08-31 DIAGNOSIS — Z90710 Acquired absence of both cervix and uterus: Secondary | ICD-10-CM

## 2018-08-31 DIAGNOSIS — J90 Pleural effusion, not elsewhere classified: Secondary | ICD-10-CM | POA: Diagnosis not present

## 2018-08-31 DIAGNOSIS — E119 Type 2 diabetes mellitus without complications: Secondary | ICD-10-CM | POA: Diagnosis not present

## 2018-08-31 DIAGNOSIS — I248 Other forms of acute ischemic heart disease: Secondary | ICD-10-CM | POA: Diagnosis present

## 2018-08-31 DIAGNOSIS — G9341 Metabolic encephalopathy: Secondary | ICD-10-CM | POA: Diagnosis not present

## 2018-08-31 DIAGNOSIS — K6389 Other specified diseases of intestine: Secondary | ICD-10-CM | POA: Diagnosis not present

## 2018-08-31 DIAGNOSIS — J440 Chronic obstructive pulmonary disease with acute lower respiratory infection: Secondary | ICD-10-CM | POA: Diagnosis present

## 2018-08-31 DIAGNOSIS — C3492 Malignant neoplasm of unspecified part of left bronchus or lung: Secondary | ICD-10-CM | POA: Diagnosis not present

## 2018-08-31 DIAGNOSIS — Z7952 Long term (current) use of systemic steroids: Secondary | ICD-10-CM

## 2018-08-31 DIAGNOSIS — I491 Atrial premature depolarization: Secondary | ICD-10-CM | POA: Diagnosis not present

## 2018-08-31 DIAGNOSIS — J449 Chronic obstructive pulmonary disease, unspecified: Secondary | ICD-10-CM | POA: Diagnosis not present

## 2018-08-31 DIAGNOSIS — I160 Hypertensive urgency: Secondary | ICD-10-CM | POA: Diagnosis present

## 2018-08-31 DIAGNOSIS — Z515 Encounter for palliative care: Secondary | ICD-10-CM | POA: Diagnosis not present

## 2018-08-31 DIAGNOSIS — D693 Immune thrombocytopenic purpura: Secondary | ICD-10-CM | POA: Diagnosis not present

## 2018-08-31 DIAGNOSIS — J9601 Acute respiratory failure with hypoxia: Secondary | ICD-10-CM | POA: Diagnosis present

## 2018-08-31 DIAGNOSIS — R0603 Acute respiratory distress: Secondary | ICD-10-CM

## 2018-08-31 DIAGNOSIS — K659 Peritonitis, unspecified: Secondary | ICD-10-CM | POA: Diagnosis present

## 2018-08-31 DIAGNOSIS — K5720 Diverticulitis of large intestine with perforation and abscess without bleeding: Secondary | ICD-10-CM | POA: Diagnosis not present

## 2018-08-31 DIAGNOSIS — Z7189 Other specified counseling: Secondary | ICD-10-CM | POA: Diagnosis not present

## 2018-08-31 DIAGNOSIS — R6521 Severe sepsis with septic shock: Secondary | ICD-10-CM | POA: Diagnosis not present

## 2018-08-31 DIAGNOSIS — E871 Hypo-osmolality and hyponatremia: Secondary | ICD-10-CM | POA: Diagnosis present

## 2018-08-31 DIAGNOSIS — Z794 Long term (current) use of insulin: Secondary | ICD-10-CM

## 2018-08-31 DIAGNOSIS — T451X5A Adverse effect of antineoplastic and immunosuppressive drugs, initial encounter: Secondary | ICD-10-CM | POA: Diagnosis present

## 2018-08-31 DIAGNOSIS — J9690 Respiratory failure, unspecified, unspecified whether with hypoxia or hypercapnia: Secondary | ICD-10-CM | POA: Diagnosis not present

## 2018-08-31 DIAGNOSIS — E861 Hypovolemia: Secondary | ICD-10-CM | POA: Diagnosis present

## 2018-08-31 DIAGNOSIS — Z8249 Family history of ischemic heart disease and other diseases of the circulatory system: Secondary | ICD-10-CM

## 2018-08-31 DIAGNOSIS — J189 Pneumonia, unspecified organism: Secondary | ICD-10-CM | POA: Diagnosis present

## 2018-08-31 DIAGNOSIS — D61810 Antineoplastic chemotherapy induced pancytopenia: Secondary | ICD-10-CM | POA: Diagnosis not present

## 2018-08-31 DIAGNOSIS — L8990 Pressure ulcer of unspecified site, unspecified stage: Secondary | ICD-10-CM

## 2018-08-31 DIAGNOSIS — D61818 Other pancytopenia: Secondary | ICD-10-CM | POA: Diagnosis not present

## 2018-08-31 DIAGNOSIS — C3412 Malignant neoplasm of upper lobe, left bronchus or lung: Secondary | ICD-10-CM | POA: Diagnosis not present

## 2018-08-31 DIAGNOSIS — R1084 Generalized abdominal pain: Secondary | ICD-10-CM | POA: Diagnosis not present

## 2018-08-31 DIAGNOSIS — Z880 Allergy status to penicillin: Secondary | ICD-10-CM

## 2018-08-31 DIAGNOSIS — Z79899 Other long term (current) drug therapy: Secondary | ICD-10-CM

## 2018-08-31 DIAGNOSIS — I10 Essential (primary) hypertension: Secondary | ICD-10-CM | POA: Diagnosis not present

## 2018-08-31 DIAGNOSIS — K567 Ileus, unspecified: Secondary | ICD-10-CM | POA: Diagnosis not present

## 2018-08-31 DIAGNOSIS — I110 Hypertensive heart disease with heart failure: Secondary | ICD-10-CM | POA: Diagnosis present

## 2018-08-31 DIAGNOSIS — Z800 Family history of malignant neoplasm of digestive organs: Secondary | ICD-10-CM

## 2018-08-31 DIAGNOSIS — I361 Nonrheumatic tricuspid (valve) insufficiency: Secondary | ICD-10-CM | POA: Diagnosis not present

## 2018-08-31 DIAGNOSIS — R195 Other fecal abnormalities: Secondary | ICD-10-CM | POA: Diagnosis not present

## 2018-08-31 DIAGNOSIS — K5732 Diverticulitis of large intestine without perforation or abscess without bleeding: Secondary | ICD-10-CM | POA: Diagnosis not present

## 2018-08-31 DIAGNOSIS — Z7401 Bed confinement status: Secondary | ICD-10-CM

## 2018-08-31 DIAGNOSIS — K219 Gastro-esophageal reflux disease without esophagitis: Secondary | ICD-10-CM | POA: Diagnosis present

## 2018-08-31 DIAGNOSIS — Z801 Family history of malignant neoplasm of trachea, bronchus and lung: Secondary | ICD-10-CM

## 2018-08-31 DIAGNOSIS — Z5111 Encounter for antineoplastic chemotherapy: Secondary | ICD-10-CM | POA: Diagnosis not present

## 2018-08-31 DIAGNOSIS — D649 Anemia, unspecified: Secondary | ICD-10-CM | POA: Diagnosis not present

## 2018-08-31 DIAGNOSIS — Z833 Family history of diabetes mellitus: Secondary | ICD-10-CM

## 2018-08-31 DIAGNOSIS — Z823 Family history of stroke: Secondary | ICD-10-CM

## 2018-08-31 DIAGNOSIS — R0689 Other abnormalities of breathing: Secondary | ICD-10-CM | POA: Diagnosis not present

## 2018-08-31 DIAGNOSIS — R109 Unspecified abdominal pain: Secondary | ICD-10-CM | POA: Diagnosis not present

## 2018-08-31 HISTORY — DX: Diverticulosis of intestine, part unspecified, without perforation or abscess without bleeding: K57.90

## 2018-08-31 HISTORY — DX: Unsteadiness on feet: R26.81

## 2018-08-31 HISTORY — DX: Other forms of dyspnea: R06.09

## 2018-08-31 HISTORY — PX: COLECTOMY WITH COLOSTOMY CREATION/HARTMANN PROCEDURE: SHX6598

## 2018-08-31 HISTORY — DX: Dyspnea, unspecified: R06.00

## 2018-08-31 HISTORY — DX: Repeated falls: R29.6

## 2018-08-31 LAB — BASIC METABOLIC PANEL
Anion gap: 13 (ref 5–15)
BUN: 18 mg/dL (ref 8–23)
CO2: 17 mmol/L — ABNORMAL LOW (ref 22–32)
CREATININE: 0.78 mg/dL (ref 0.44–1.00)
Calcium: 7.3 mg/dL — ABNORMAL LOW (ref 8.9–10.3)
Chloride: 97 mmol/L — ABNORMAL LOW (ref 98–111)
GFR calc Af Amer: >60 mL/min (ref >60)
GFR calc non Af Amer: >60 mL/min (ref >60)
Glucose, Bld: 239 mg/dL — ABNORMAL HIGH (ref 70–99)
Potassium: 5.1 mmol/L (ref 3.5–5.1)
SODIUM: 127 mmol/L — AB (ref 135–145)

## 2018-08-31 LAB — COMPREHENSIVE METABOLIC PANEL
ALT: 44 U/L (ref 0–44)
AST: 35 U/L (ref 15–41)
Albumin: 3.3 g/dL — ABNORMAL LOW (ref 3.5–5.0)
Alkaline Phosphatase: 59 U/L (ref 38–126)
Anion gap: 17 — ABNORMAL HIGH (ref 5–15)
BUN: 33 mg/dL — ABNORMAL HIGH (ref 8–23)
CO2: 21 mmol/L — ABNORMAL LOW (ref 22–32)
Calcium: 9.5 mg/dL (ref 8.9–10.3)
Chloride: 91 mmol/L — ABNORMAL LOW (ref 98–111)
Creatinine, Ser: 0.95 mg/dL (ref 0.44–1.00)
GFR calc Af Amer: >60 mL/min (ref >60)
GFR calc non Af Amer: >60 mL/min (ref >60)
Glucose, Bld: 258 mg/dL — ABNORMAL HIGH (ref 70–99)
Potassium: 4.3 mmol/L (ref 3.5–5.1)
Sodium: 129 mmol/L — ABNORMAL LOW (ref 135–145)
Total Bilirubin: 0.9 mg/dL (ref 0.3–1.2)
Total Protein: 6.1 g/dL — ABNORMAL LOW (ref 6.5–8.1)

## 2018-08-31 LAB — CBC
HCT: 32.2 % — ABNORMAL LOW (ref 36.0–46.0)
Hemoglobin: 9.8 g/dL — ABNORMAL LOW (ref 12.0–15.0)
MCH: 27.4 pg (ref 26.0–34.0)
MCHC: 30.4 g/dL (ref 30.0–36.0)
MCV: 89.9 fL (ref 80.0–100.0)
PLATELETS: 98 10*3/uL — AB (ref 150–400)
RBC: 3.58 MIL/uL — ABNORMAL LOW (ref 3.87–5.11)
RDW: 15.9 % — ABNORMAL HIGH (ref 11.5–15.5)
WBC: 2.3 10*3/uL — ABNORMAL LOW (ref 4.0–10.5)
nRBC: 8.4 % — ABNORMAL HIGH (ref 0.0–0.2)

## 2018-08-31 LAB — LACTIC ACID, PLASMA: Lactic Acid, Venous: 6.4 mmol/L (ref 0.5–1.9)

## 2018-08-31 LAB — URINALYSIS, ROUTINE W REFLEX MICROSCOPIC
Bilirubin Urine: NEGATIVE
Glucose, UA: >=500 mg/dL — AB
Hgb urine dipstick: NEGATIVE
Ketones, ur: NEGATIVE mg/dL
Nitrite: NEGATIVE
Protein, ur: NEGATIVE mg/dL
Specific Gravity, Urine: 1.022 (ref 1.005–1.030)
pH: 5 (ref 5.0–8.0)

## 2018-08-31 LAB — GLUCOSE, CAPILLARY
Glucose-Capillary: 144 mg/dL — ABNORMAL HIGH (ref 70–99)
Glucose-Capillary: 192 mg/dL — ABNORMAL HIGH (ref 70–99)
Glucose-Capillary: 217 mg/dL — ABNORMAL HIGH (ref 70–99)

## 2018-08-31 LAB — CBC WITH DIFFERENTIAL/PLATELET
Abs Immature Granulocytes: 0.53 10*3/uL — ABNORMAL HIGH (ref 0.00–0.07)
Basophils Absolute: 0.1 10*3/uL (ref 0.0–0.1)
Basophils Relative: 1 %
Eosinophils Absolute: 0.1 10*3/uL (ref 0.0–0.5)
Eosinophils Relative: 1 %
HCT: 42.1 % (ref 36.0–46.0)
Hemoglobin: 13 g/dL (ref 12.0–15.0)
Immature Granulocytes: 5 %
Lymphocytes Relative: 10 %
Lymphs Abs: 1 10*3/uL (ref 0.7–4.0)
MCH: 27.1 pg (ref 26.0–34.0)
MCHC: 30.9 g/dL (ref 30.0–36.0)
MCV: 87.7 fL (ref 80.0–100.0)
Monocytes Absolute: 0.3 10*3/uL (ref 0.1–1.0)
Monocytes Relative: 3 %
Neutro Abs: 8 10*3/uL — ABNORMAL HIGH (ref 1.7–7.7)
Neutrophils Relative %: 80 %
Platelets: 158 10*3/uL (ref 150–400)
RBC: 4.8 MIL/uL (ref 3.87–5.11)
RDW: 16 % — ABNORMAL HIGH (ref 11.5–15.5)
WBC: 9.9 10*3/uL (ref 4.0–10.5)
nRBC: 0.9 % — ABNORMAL HIGH (ref 0.0–0.2)

## 2018-08-31 LAB — POCT I-STAT 3, ART BLOOD GAS (G3+)
Acid-base deficit: 10 mmol/L — ABNORMAL HIGH (ref 0.0–2.0)
Bicarbonate: 14.7 mmol/L — ABNORMAL LOW (ref 20.0–28.0)
O2 SAT: 96 %
Patient temperature: 98.5
TCO2: 16 mmol/L — ABNORMAL LOW (ref 22–32)
pCO2 arterial: 28.2 mmHg — ABNORMAL LOW (ref 32.0–48.0)
pH, Arterial: 7.325 — ABNORMAL LOW (ref 7.350–7.450)
pO2, Arterial: 84 mmHg (ref 83.0–108.0)

## 2018-08-31 LAB — CBG MONITORING, ED: Glucose-Capillary: 188 mg/dL — ABNORMAL HIGH (ref 70–99)

## 2018-08-31 LAB — LIPASE, BLOOD: Lipase: 22 U/L (ref 11–51)

## 2018-08-31 LAB — STOOL CULTURE: E coli, Shiga toxin Assay: NEGATIVE

## 2018-08-31 LAB — TROPONIN I
Troponin I: 0.05 ng/mL (ref <0.03)
Troponin I: 0.05 ng/mL (ref <0.03)

## 2018-08-31 LAB — PHOSPHORUS: Phosphorus: 5.5 mg/dL — ABNORMAL HIGH (ref 2.5–4.6)

## 2018-08-31 LAB — STOOL CULTURE REFLEX - RSASHR

## 2018-08-31 LAB — MAGNESIUM
Magnesium: 1.3 mg/dL — ABNORMAL LOW (ref 1.7–2.4)
Magnesium: 1.7 mg/dL (ref 1.7–2.4)

## 2018-08-31 LAB — STOOL CULTURE REFLEX - CMPCXR

## 2018-08-31 LAB — PREPARE RBC (CROSSMATCH)

## 2018-08-31 SURGERY — COLECTOMY, WITH COLOSTOMY CREATION
Anesthesia: General

## 2018-08-31 MED ORDER — DILTIAZEM HCL-DEXTROSE 100-5 MG/100ML-% IV SOLN (PREMIX)
5.0000 mg/h | INTRAVENOUS | Status: DC
  Administered 2018-08-31: 5 mg/h via INTRAVENOUS
  Filled 2018-08-31: qty 100

## 2018-08-31 MED ORDER — SUGAMMADEX SODIUM 200 MG/2ML IV SOLN
INTRAVENOUS | Status: DC | PRN
  Administered 2018-08-31: 200 mg via INTRAVENOUS

## 2018-08-31 MED ORDER — SODIUM CHLORIDE 0.9 % IV SOLN
INTRAVENOUS | Status: DC
  Administered 2018-08-31 – 2018-09-01 (×3): via INTRAVENOUS

## 2018-08-31 MED ORDER — HYDROMORPHONE HCL 1 MG/ML IJ SOLN
INTRAMUSCULAR | Status: AC
  Filled 2018-08-31: qty 1

## 2018-08-31 MED ORDER — FENTANYL CITRATE (PF) 250 MCG/5ML IJ SOLN
INTRAMUSCULAR | Status: AC
  Filled 2018-08-31: qty 5

## 2018-08-31 MED ORDER — METOPROLOL TARTRATE 5 MG/5ML IV SOLN
INTRAVENOUS | Status: DC | PRN
  Administered 2018-08-31 (×5): 1 mg via INTRAVENOUS

## 2018-08-31 MED ORDER — MAGNESIUM SULFATE 2 GM/50ML IV SOLN
2.0000 g | Freq: Once | INTRAVENOUS | Status: AC
  Administered 2018-08-31: 2 g via INTRAVENOUS
  Filled 2018-08-31: qty 50

## 2018-08-31 MED ORDER — ONDANSETRON HCL 4 MG/2ML IJ SOLN
INTRAMUSCULAR | Status: AC
  Filled 2018-08-31: qty 2

## 2018-08-31 MED ORDER — BUDESONIDE 0.5 MG/2ML IN SUSP
0.5000 mg | Freq: Two times a day (BID) | RESPIRATORY_TRACT | Status: DC
  Administered 2018-09-01 – 2018-09-10 (×19): 0.5 mg via RESPIRATORY_TRACT
  Filled 2018-08-31 (×21): qty 2

## 2018-08-31 MED ORDER — PROMETHAZINE HCL 25 MG/ML IJ SOLN: 6.2500 mg | INTRAMUSCULAR | Status: DC | PRN

## 2018-08-31 MED ORDER — SODIUM CHLORIDE 0.9% IV SOLUTION: Freq: Once | INTRAVENOUS | Status: AC

## 2018-08-31 MED ORDER — SODIUM CHLORIDE 0.9 % IV BOLUS
500.0000 mL | Freq: Once | INTRAVENOUS | Status: AC
  Administered 2018-08-31: 500 mL via INTRAVENOUS

## 2018-08-31 MED ORDER — SODIUM CHLORIDE 0.9 % IV SOLN
INTRAVENOUS | Status: DC
  Administered 2018-08-31: 14:00:00 via INTRAVENOUS

## 2018-08-31 MED ORDER — METHYLPREDNISOLONE SODIUM SUCC 125 MG IJ SOLR
80.0000 mg | Freq: Once | INTRAMUSCULAR | Status: AC
  Administered 2018-08-31: 80 mg via INTRAVENOUS
  Filled 2018-08-31: qty 2

## 2018-08-31 MED ORDER — SODIUM BICARBONATE 8.4 % IV SOLN
INTRAVENOUS | Status: AC
  Administered 2018-08-31: 50 meq via INTRAVENOUS
  Filled 2018-08-31: qty 50

## 2018-08-31 MED ORDER — IPRATROPIUM-ALBUTEROL 0.5-2.5 (3) MG/3ML IN SOLN
3.0000 mL | Freq: Two times a day (BID) | RESPIRATORY_TRACT | Status: DC
  Administered 2018-09-01 – 2018-09-10 (×18): 3 mL via RESPIRATORY_TRACT
  Filled 2018-08-31 (×21): qty 3

## 2018-08-31 MED ORDER — PHENYLEPHRINE HCL-NACL 10-0.9 MG/250ML-% IV SOLN
INTRAVENOUS | Status: AC
  Administered 2018-08-31: 20 ug/min via INTRAVENOUS
  Filled 2018-08-31: qty 250

## 2018-08-31 MED ORDER — METHYLPREDNISOLONE SODIUM SUCC 40 MG IJ SOLR
40.0000 mg | Freq: Four times a day (QID) | INTRAMUSCULAR | Status: DC
  Administered 2018-08-31: 40 mg via INTRAVENOUS
  Filled 2018-08-31: qty 1

## 2018-08-31 MED ORDER — LORAZEPAM 2 MG/ML IJ SOLN: 0.5000 mg | INTRAMUSCULAR | Status: DC | PRN

## 2018-08-31 MED ORDER — FENTANYL CITRATE (PF) 100 MCG/2ML IJ SOLN
100.0000 ug | Freq: Once | INTRAMUSCULAR | Status: AC
  Administered 2018-08-31: 100 ug via INTRAVENOUS
  Filled 2018-08-31: qty 2

## 2018-08-31 MED ORDER — HYDROMORPHONE HCL 1 MG/ML IJ SOLN
0.2500 mg | INTRAMUSCULAR | Status: DC | PRN
  Administered 2018-08-31 (×2): 0.5 mg via INTRAVENOUS

## 2018-08-31 MED ORDER — ROCURONIUM BROMIDE 50 MG/5ML IV SOSY
PREFILLED_SYRINGE | INTRAVENOUS | Status: DC | PRN
  Administered 2018-08-31: 40 mg via INTRAVENOUS
  Administered 2018-08-31: 10 mg via INTRAVENOUS

## 2018-08-31 MED ORDER — MAGNESIUM SULFATE 2 GM/50ML IV SOLN
2.0000 g | Freq: Once | INTRAVENOUS | Status: AC
  Administered 2018-09-01: 2 g via INTRAVENOUS
  Filled 2018-08-31: qty 50

## 2018-08-31 MED ORDER — HEPARIN SODIUM (PORCINE) 5000 UNIT/ML IJ SOLN
5000.0000 [IU] | Freq: Three times a day (TID) | INTRAMUSCULAR | Status: DC
  Administered 2018-08-31: 5000 [IU] via SUBCUTANEOUS
  Filled 2018-08-31 (×2): qty 1

## 2018-08-31 MED ORDER — SODIUM BICARBONATE 8.4 % IV SOLN
50.0000 meq | Freq: Once | INTRAVENOUS | Status: AC
  Administered 2018-08-31: 50 meq via INTRAVENOUS

## 2018-08-31 MED ORDER — LIDOCAINE 2% (20 MG/ML) 5 ML SYRINGE
INTRAMUSCULAR | Status: AC
  Filled 2018-08-31: qty 5

## 2018-08-31 MED ORDER — SUCCINYLCHOLINE CHLORIDE 200 MG/10ML IV SOSY
PREFILLED_SYRINGE | INTRAVENOUS | Status: AC
  Filled 2018-08-31: qty 10

## 2018-08-31 MED ORDER — HYDROCORTISONE NA SUCCINATE PF 100 MG IJ SOLR
100.0000 mg | Freq: Three times a day (TID) | INTRAMUSCULAR | Status: DC
  Administered 2018-09-01 (×2): 100 mg via INTRAVENOUS
  Filled 2018-08-31 (×2): qty 2

## 2018-08-31 MED ORDER — ACETAMINOPHEN 325 MG PO TABS
650.0000 mg | ORAL_TABLET | Freq: Four times a day (QID) | ORAL | Status: DC | PRN
  Administered 2018-09-10: 650 mg via ORAL
  Filled 2018-08-31 (×4): qty 2

## 2018-08-31 MED ORDER — PHENYLEPHRINE HCL-NACL 10-0.9 MG/250ML-% IV SOLN
0.0000 ug/min | INTRAVENOUS | Status: DC
  Administered 2018-08-31: 20 ug/min via INTRAVENOUS

## 2018-08-31 MED ORDER — LACTATED RINGERS IV SOLN
INTRAVENOUS | Status: DC | PRN
  Administered 2018-08-31 (×3): via INTRAVENOUS

## 2018-08-31 MED ORDER — FENTANYL CITRATE (PF) 100 MCG/2ML IJ SOLN
INTRAMUSCULAR | Status: DC | PRN
  Administered 2018-08-31 (×2): 100 ug via INTRAVENOUS
  Administered 2018-08-31: 150 ug via INTRAVENOUS

## 2018-08-31 MED ORDER — MORPHINE SULFATE (PF) 2 MG/ML IV SOLN
2.0000 mg | INTRAVENOUS | Status: DC | PRN
  Administered 2018-08-31 – 2018-09-09 (×39): 2 mg via INTRAVENOUS
  Filled 2018-08-31 (×40): qty 1

## 2018-08-31 MED ORDER — ONDANSETRON HCL 4 MG/2ML IJ SOLN
4.0000 mg | Freq: Four times a day (QID) | INTRAMUSCULAR | Status: DC | PRN
  Administered 2018-09-10: 4 mg via INTRAVENOUS
  Filled 2018-08-31: qty 2

## 2018-08-31 MED ORDER — PANTOPRAZOLE SODIUM 40 MG IV SOLR
40.0000 mg | INTRAVENOUS | Status: DC
  Administered 2018-09-01 – 2018-09-05 (×5): 40 mg via INTRAVENOUS
  Filled 2018-08-31 (×5): qty 40

## 2018-08-31 MED ORDER — LABETALOL HCL 5 MG/ML IV SOLN
5.0000 mg | INTRAVENOUS | Status: DC | PRN
  Administered 2018-08-31: 5 mg via INTRAVENOUS

## 2018-08-31 MED ORDER — INSULIN ASPART 100 UNIT/ML ~~LOC~~ SOLN
2.0000 [IU] | SUBCUTANEOUS | Status: DC
  Administered 2018-08-31: 6 [IU] via SUBCUTANEOUS
  Administered 2018-09-01: 4 [IU] via SUBCUTANEOUS

## 2018-08-31 MED ORDER — SODIUM CHLORIDE 0.9 % IV SOLN
1.0000 g | Freq: Three times a day (TID) | INTRAVENOUS | Status: DC
  Administered 2018-08-31 – 2018-09-05 (×16): 1 g via INTRAVENOUS
  Filled 2018-08-31 (×20): qty 1

## 2018-08-31 MED ORDER — MEPERIDINE HCL 50 MG/ML IJ SOLN: 6.2500 mg | INTRAMUSCULAR | Status: DC | PRN

## 2018-08-31 MED ORDER — ROCURONIUM BROMIDE 50 MG/5ML IV SOSY
PREFILLED_SYRINGE | INTRAVENOUS | Status: AC
  Filled 2018-08-31: qty 5

## 2018-08-31 MED ORDER — SODIUM CHLORIDE 0.9 % IV BOLUS (SEPSIS)
1000.0000 mL | Freq: Once | INTRAVENOUS | Status: AC
  Administered 2018-08-31: 1000 mL via INTRAVENOUS

## 2018-08-31 MED ORDER — HYDROMORPHONE HCL 1 MG/ML IJ SOLN: 0.5000 mg | Freq: Once | INTRAMUSCULAR | Status: DC

## 2018-08-31 MED ORDER — ONDANSETRON HCL 4 MG PO TABS: 4.0000 mg | ORAL_TABLET | Freq: Four times a day (QID) | ORAL | Status: DC | PRN

## 2018-08-31 MED ORDER — MORPHINE SULFATE (PF) 4 MG/ML IV SOLN
4.0000 mg | INTRAVENOUS | Status: AC | PRN
  Administered 2018-08-31 (×2): 4 mg via INTRAVENOUS
  Filled 2018-08-31 (×2): qty 1

## 2018-08-31 MED ORDER — ALBUTEROL SULFATE (2.5 MG/3ML) 0.083% IN NEBU
2.5000 mg | INHALATION_SOLUTION | RESPIRATORY_TRACT | Status: DC | PRN
  Administered 2018-09-05: 2.5 mg via RESPIRATORY_TRACT
  Filled 2018-08-31: qty 3

## 2018-08-31 MED ORDER — PHENYLEPHRINE 40 MCG/ML (10ML) SYRINGE FOR IV PUSH (FOR BLOOD PRESSURE SUPPORT)
PREFILLED_SYRINGE | INTRAVENOUS | Status: AC
  Filled 2018-08-31: qty 10

## 2018-08-31 MED ORDER — 0.9 % SODIUM CHLORIDE (POUR BTL) OPTIME
TOPICAL | Status: DC | PRN
  Administered 2018-08-31 (×2): 2000 mL

## 2018-08-31 MED ORDER — HEPARIN SOD (PORK) LOCK FLUSH 100 UNIT/ML IV SOLN
INTRAVENOUS | Status: AC
  Administered 2018-08-31: 07:00:00
  Filled 2018-08-31: qty 5

## 2018-08-31 MED ORDER — LABETALOL HCL 5 MG/ML IV SOLN
INTRAVENOUS | Status: AC
  Filled 2018-08-31: qty 4

## 2018-08-31 MED ORDER — ONDANSETRON HCL 4 MG/2ML IJ SOLN
INTRAMUSCULAR | Status: DC | PRN
  Administered 2018-08-31: 4 mg via INTRAVENOUS

## 2018-08-31 MED ORDER — IOPAMIDOL (ISOVUE-300) INJECTION 61%
100.0000 mL | Freq: Once | INTRAVENOUS | Status: AC | PRN
  Administered 2018-08-31: 100 mL via INTRAVENOUS

## 2018-08-31 MED ORDER — HYDRALAZINE HCL 20 MG/ML IJ SOLN: 10.0000 mg | INTRAMUSCULAR | Status: DC | PRN

## 2018-08-31 MED ORDER — HEMOSTATIC AGENTS (NO CHARGE) OPTIME
TOPICAL | Status: DC | PRN
  Administered 2018-08-31: 1 via TOPICAL

## 2018-08-31 MED ORDER — HYDRALAZINE HCL 20 MG/ML IJ SOLN
10.0000 mg | INTRAMUSCULAR | Status: DC | PRN
  Administered 2018-08-31: 10 mg via INTRAVENOUS
  Filled 2018-08-31: qty 1

## 2018-08-31 MED ORDER — SUCCINYLCHOLINE CHLORIDE 20 MG/ML IJ SOLN
INTRAMUSCULAR | Status: DC | PRN
  Administered 2018-08-31: 140 mg via INTRAVENOUS

## 2018-08-31 MED ORDER — ARFORMOTEROL TARTRATE 15 MCG/2ML IN NEBU
15.0000 ug | INHALATION_SOLUTION | Freq: Two times a day (BID) | RESPIRATORY_TRACT | Status: DC
  Administered 2018-08-31 – 2018-09-10 (×19): 15 ug via RESPIRATORY_TRACT
  Filled 2018-08-31 (×24): qty 2

## 2018-08-31 MED ORDER — PROPOFOL 10 MG/ML IV BOLUS
INTRAVENOUS | Status: DC | PRN
  Administered 2018-08-31: 120 mg via INTRAVENOUS

## 2018-08-31 MED ORDER — DILTIAZEM HCL ER COATED BEADS 120 MG PO CP24
120.0000 mg | ORAL_CAPSULE | Freq: Every day | ORAL | Status: DC
  Administered 2018-08-31: 120 mg via ORAL
  Filled 2018-08-31 (×2): qty 1

## 2018-08-31 MED ORDER — ACETAMINOPHEN 650 MG RE SUPP
650.0000 mg | Freq: Four times a day (QID) | RECTAL | Status: DC | PRN
  Administered 2018-09-09 – 2018-09-11 (×2): 650 mg via RECTAL
  Filled 2018-08-31 (×2): qty 1

## 2018-08-31 MED ORDER — IPRATROPIUM-ALBUTEROL 0.5-2.5 (3) MG/3ML IN SOLN
3.0000 mL | Freq: Four times a day (QID) | RESPIRATORY_TRACT | Status: DC
  Administered 2018-08-31: 3 mL via RESPIRATORY_TRACT
  Filled 2018-08-31: qty 3

## 2018-08-31 SURGICAL SUPPLY — 41 items
CHLORAPREP W/TINT 26ML (MISCELLANEOUS) ×3 IMPLANT
CLIP VESOCCLUDE SM WIDE 6/CT (CLIP) ×3 IMPLANT
COVER SURGICAL LIGHT HANDLE (MISCELLANEOUS) ×3 IMPLANT
DRAIN CHANNEL 19F RND (DRAIN) ×2 IMPLANT
DRAPE LAPAROSCOPIC ABDOMINAL (DRAPES) ×3 IMPLANT
DRAPE WARM FLUID 44X44 (DRAPE) ×3 IMPLANT
DRSG VAC ATS LRG SENSATRAC (GAUZE/BANDAGES/DRESSINGS) ×2 IMPLANT
ELECT BLADE 6.5 EXT (BLADE) ×2 IMPLANT
ELECT CAUTERY BLADE 6.4 (BLADE) ×3 IMPLANT
ELECT REM PT RETURN 9FT ADLT (ELECTROSURGICAL) ×3
ELECTRODE REM PT RTRN 9FT ADLT (ELECTROSURGICAL) ×1 IMPLANT
EVACUATOR SILICONE 100CC (DRAIN) ×2 IMPLANT
GLOVE BIOGEL PI IND STRL 7.0 (GLOVE) ×1 IMPLANT
GLOVE BIOGEL PI INDICATOR 7.0 (GLOVE) ×2
GLOVE SURG SS PI 7.0 STRL IVOR (GLOVE) ×3 IMPLANT
GOWN STRL REUS W/ TWL LRG LVL3 (GOWN DISPOSABLE) ×2 IMPLANT
GOWN STRL REUS W/TWL LRG LVL3 (GOWN DISPOSABLE) ×6
KIT BASIN OR (CUSTOM PROCEDURE TRAY) ×3 IMPLANT
LIGASURE IMPACT 36 18CM CVD LR (INSTRUMENTS) ×2 IMPLANT
PACK GENERAL/GYN (CUSTOM PROCEDURE TRAY) ×3 IMPLANT
PENCIL BUTTON HOLSTER BLD 10FT (ELECTRODE) ×2 IMPLANT
PENCIL SMOKE EVACUATOR (MISCELLANEOUS) ×3 IMPLANT
STAPLER CUT CVD 40MM BLUE (STAPLE) ×2 IMPLANT
STAPLER CUT RELOAD BLUE (STAPLE) ×2 IMPLANT
STAPLER VISISTAT 35W (STAPLE) ×3 IMPLANT
SUCTION POOLE TIP (SUCTIONS) ×3 IMPLANT
SURGICEL SNOW 2X4 (HEMOSTASIS) ×2 IMPLANT
SUT ETHILON 2 0 FS 18 (SUTURE) ×2 IMPLANT
SUT PDS AB 1 TP1 96 (SUTURE) ×4 IMPLANT
SUT PROLENE 2 0 SH DA (SUTURE) ×4 IMPLANT
SUT SILK 2 0 (SUTURE) ×3
SUT SILK 2 0 SH CR/8 (SUTURE) ×3 IMPLANT
SUT SILK 2-0 18XBRD TIE 12 (SUTURE) ×1 IMPLANT
SUT SILK 3 0 (SUTURE) ×3
SUT SILK 3 0 SH CR/8 (SUTURE) ×3 IMPLANT
SUT SILK 3-0 18XBRD TIE 12 (SUTURE) ×1 IMPLANT
SUT VIC AB 3-0 SH 18 (SUTURE) ×4 IMPLANT
TOWEL OR 17X26 10 PK STRL BLUE (TOWEL DISPOSABLE) ×3 IMPLANT
TRAY FOL W/BAG SLVR 16FR STRL (SET/KITS/TRAYS/PACK) IMPLANT
TRAY FOLEY W/BAG SLVR 16FR LF (SET/KITS/TRAYS/PACK) ×3
YANKAUER SUCT BULB TIP NO VENT (SUCTIONS) ×2 IMPLANT

## 2018-08-31 NOTE — H&P (Signed)
Reason for Consult:free air Referring Physician: Shanon Brow Tat  [name] is an 73 y.o. female.  HPI: 73 yo female with 1 week of abdominal pain. Initially it was associated with diarrhea and she tried some imodium. The pain worsened yesterday and she came to the ER today. She denies fevers or chills. She denies nausea or vomiting. She has never had pain like this before. She has never had diverticulitis before  Past Medical History:  Diagnosis Date  . Anxiety   . Arthritis   . Bipolar disorder ([place])   . Chronic back pain   . Chronic dyspnea   . COPD (chronic obstructive pulmonary disease) ([place])   . Diverticulosis   . Family history of lung cancer   . Family history of prostate cancer   . Family history of stomach cancer   . 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 Frequent falls   . Gait instability   . GERD (gastroesophageal reflux disease) 2000  . Hyperlipidemia 1995  . Hypertension 1995  . Nicotine addiction   . Obesity     Past Surgical History:  Procedure Laterality Date  . BACK SURGERY  1999  . BRONCHIAL BRUSHINGS Left 10/30/2017   Procedure: BRONCHIAL BRUSHINGS;  Surgeon: [name], MD;  [facility];  Service: Cardiopulmonary;  Laterality: Left;  . BRONCHIAL WASHINGS Left 10/30/2017   Procedure: BRONCHIAL WASHINGS;  Surgeon: [name], MD;  Location: [facility];  Service: Cardiopulmonary;  Laterality: Left;  . CHOLECYSTECTOMY  2001  . COLONOSCOPY N/A 02/02/2016   Procedure: COLONOSCOPY;  Surgeon: [name], MD;  Location: [facility];  Service: Endoscopy;  Laterality: N/A;  930  . FLEXIBLE BRONCHOSCOPY Bilateral 10/30/2017   Procedure: FLEXIBLE BRONCHOSCOPY;  Surgeon: [name], MD;  Location: [facility];  Service: Cardiopulmonary;  Laterality: Bilateral;  . INCISIONAL HERNIA REPAIR  August 17, 2008  . left inguinal hernia herniorrhapy  1980  . PORTACATH PLACEMENT Left 02/03/2018   Procedure: INSERTION PORT-A-CATH;  Surgeon: [name], MD;  Location: [facility];  Service: General;  Laterality: Left;  . removal of thmoma  03/27/2010   Dr. Arlyce Dice   . SPINE SURGERY    . TONSILLECTOMY    . TOTAL ABDOMINAL HYSTERECTOMY W/ BILATERAL SALPINGOOPHORECTOMY  1984    Family History  Problem Relation Age of Onset  . Heart disease Mother        enlarged  . Diabetes Mother   . Hypertension Mother   . Cancer Father        throat cancer - smoker  . Hypertension Father   . Coronary artery disease Father   . Diabetes Sister        x3  . Asthma Sister   . Lung cancer Sister 30       d. 52; smoker  . Kidney disease Brother   . Stomach cancer Sister        dx in her 76s-50s  . Stroke Brother   . Prostate cancer Brother 86       pat 1/2 brother  . Thyroid cancer Maternal Aunt        dx and d. in her 18s  . Bone cancer Maternal Uncle   . Bone cancer Maternal Grandfather   . Cancer Cousin        NOS - had a swollen stomach    Social History:  reports that she quit smoking about a year  ago. Her smoking use included cigarettes. She has a 81.00 pack-year smoking history. She has never used smokeless tobacco. She reports that she does not drink alcohol or use drugs.  Allergies:  Allergies  Allergen Reactions  . Bee Venom Anaphylaxis  . Penicillins Itching    Blisters in mouth after dental work Has patient had a PCN reaction causing immediate rash, facial/tongue/throat swelling, SOB or lightheadedness with hypotension: no Has patient had a PCN reaction causing severe rash involving mucus membranes or skin necrosis: No Has patient had a PCN reaction that required hospitalization No Has patient had a PCN reaction occurring within the last 10 years: Yes If all of the above answers are "NO", then may proceed with Cephalosporin use.   . Codeine   . Motrin [Ibuprofen]     "messes" with her head.    Medications: I have reviewed the patient's current medications.  Results  for orders placed or performed during the hospital encounter of 09/02/2018 (from the past 48 hour(s))  CBC with Differential/Platelet     Status: Abnormal   Collection Time: 08/23/2018  7:32 AM  Result Value Ref Range   WBC 9.9 4.0 - 10.5 K/uL    Comment: WHITE COUNT CONFIRMED ON SMEAR   RBC 4.80 3.87 - 5.11 MIL/uL   Hemoglobin 13.0 12.0 - 15.0 g/dL   HCT 42.1 36.0 - 46.0 %   MCV 87.7 80.0 - 100.0 fL   MCH 27.1 26.0 - 34.0 pg   MCHC 30.9 30.0 - 36.0 g/dL   RDW 16.0 (H) 11.5 - 15.5 %   Platelets 158 150 - 400 K/uL   nRBC 0.9 (H) 0.0 - 0.2 %   Neutrophils Relative % 80 %   Neutro Abs 8.0 (H) 1.7 - 7.7 K/uL   Lymphocytes Relative 10 %   Lymphs Abs 1.0 0.7 - 4.0 K/uL   Monocytes Relative 3 %   Monocytes Absolute 0.3 0.1 - 1.0 K/uL   Eosinophils Relative 1 %   Eosinophils Absolute 0.1 0.0 - 0.5 K/uL   Basophils Relative 1 %   Basophils Absolute 0.1 0.0 - 0.1 K/uL   WBC Morphology DOHLE BODIES     Comment: MILD LEFT SHIFT (1-5% METAS, OCC MYELO, OCC BANDS)   Immature Granulocytes 5 %   Abs Immature Granulocytes 0.53 (H) 0.00 - 0.07 K/uL   Reactive, Benign Lymphocytes PRESENT     Comment: Performed at [facility], [address] [place], [place] [address]  Comprehensive metabolic panel     Status: Abnormal   Collection Time: 09/01/2018  7:32 AM  Result Value Ref Range   Sodium 129 (L) 135 - 145 mmol/L   Potassium 4.3 3.5 - 5.1 mmol/L   Chloride 91 (L) 98 - 111 mmol/L   CO2 21 (L) 22 - 32 mmol/L   Glucose, Bld 258 (H) 70 - 99 mg/dL   BUN 33 (H) 8 - 23 mg/dL   Creatinine, Ser 0.95 0.44 - 1.00 mg/dL   Calcium 9.5 8.9 - 10.3 mg/dL   Total Protein 6.1 (L) 6.5 - 8.1 g/dL   Albumin 3.3 (L) 3.5 - 5.0 g/dL   AST 35 15 - 41 U/L   ALT 44 0 - 44 U/L   Alkaline Phosphatase 59 38 - 126 U/L   Total Bilirubin 0.9 0.3 - 1.2 mg/dL   GFR calc non Af Amer >60 >60 mL/min   GFR calc Af Amer >60 >60 mL/min   Anion gap 17 (H) 5 - 15  Comment: Performed at [facility], [address]  [place], [place] [address]  Lipase, blood     Status: None   Collection Time: 09/05/2018  7:32 AM  Result Value Ref Range   Lipase 22 11 - 51 U/L    Comment: Performed at [facility], [address], [place], [place] [address]  Troponin I - Once     Status: Abnormal   Collection Time: 08/30/2018  7:32 AM  Result Value Ref Range   Troponin I 0.05 (HH) <0.03 ng/mL    Comment: CRITICAL RESULT CALLED TO, READ BACK BY AND VERIFIED WITH: WHITE,M_0  BY MATTHEWS,B 1.19.2020 Performed at [facility], [address] [place], [place] [address]   Magnesium     Status: Abnormal   Collection Time: 08/29/2018  7:32 AM  Result Value Ref Range   Magnesium 1.3 (L) 1.7 - 2.4 mg/dL    Comment: Performed at [facility], [address], [place], [place] [address]  Urinalysis, Routine w reflex microscopic     Status: Abnormal   Collection Time: 08/18/2018  8:00 AM  Result Value Ref Range   Color, Urine YELLOW YELLOW   APPearance HAZY (A) CLEAR   Specific Gravity, Urine 1.022 1.005 - 1.030   pH 5.0 5.0 - 8.0   Glucose, UA >=500 (A) NEGATIVE mg/dL   Hgb urine dipstick NEGATIVE NEGATIVE   Bilirubin Urine NEGATIVE NEGATIVE   Ketones, ur NEGATIVE NEGATIVE mg/dL   Protein, ur NEGATIVE NEGATIVE mg/dL   Nitrite NEGATIVE NEGATIVE   Leukocytes, UA TRACE (A) NEGATIVE   RBC / HPF 0-5 0 - 5 RBC/hpf   WBC, UA 6-10 0 - 5 WBC/hpf   Bacteria, UA RARE (A) NONE SEEN   Squamous Epithelial / LPF 0-5 0 - 5   Mucus PRESENT     Comment: Performed at [facility], [address], [place], [place] [address]  CBG monitoring, ED     Status: Abnormal   Collection Time: 08/26/2018 12:51 PM  Result Value Ref Range   Glucose-Capillary 188 (H) 70 - 99 mg/dL    Ct Abdomen Pelvis W Contrast  Result Date: 08/13/2018 CLINICAL DATA:  Generalized abdominal pain. History of lung carcinoma EXAM: CT ABDOMEN AND PELVIS WITH CONTRAST TECHNIQUE: Multidetector CT imaging of the abdomen and pelvis was performed using the standard protocol following  bolus administration of intravenous contrast. CONTRAST:  173m ISOVUE-300 IOPAMIDOL (ISOVUE-300) INJECTION 61% COMPARISON:  PET-CT November 13, 2017 FINDINGS: Lower chest: Lung bases are clear. Hepatobiliary: No focal liver lesions are evident. Gallbladder is absent. The common hepatic is dilated at 12 mm. There is tapering distally without biliary duct mass or calculus. No intrahepatic biliary duct dilatation is evident. Pancreas: There is no pancreatic mass or inflammatory focus. Spleen: No splenic lesions are evident. Adrenals/Urinary Tract: Adrenals appear normal bilaterally. Kidneys bilaterally show no evident mass or hydronephrosis on either side. There is no appreciable renal or ureteral calculus on either side. Urinary bladder is midline with wall thickness within normal limits. Stomach/Bowel: There is evident bowel perforation at the level of the junction of the descending colon and sigmoid colon. Soft tissue air tracks from this area into the left pelvis with surrounding mesenteric stranding. Free air is seen extending throughout the left abdomen into the mid and upper abdominal regions, reaching the medial left hemidiaphragm near the level of the stomach. There is no associated mass by CT in this area. Elsewhere, no other areas of perforation or evident. There is fluid throughout much of the small  bowel which may be indicative of a degree of ileus secondary to the bowel perforation more distally. No portal venous air is evident. Vascular/Lymphatic: There is aortic atherosclerosis. There is also right common iliac artery atherosclerosis. Major mesenteric arterial vessels appear patent. No adenopathy is appreciable in the abdomen or pelvis. Reproductive: The uterus is absent.  No pelvic masses evident. Other: There is no periappendiceal region inflammation. A surgical clip is seen in the periappendiceal region. Appendix not seen. No abscess or ascites is evident in the abdomen or pelvis. Patient has had  previous hernia repair in the periumbilical region with areas of mesh present. Musculoskeletal: There is multilevel arthropathy in the lumbar spine. No blastic or lytic bone lesions are evident. No intramuscular lesions are evident. IMPRESSION: 1. Evident bowel perforation at the junction of the descending colon and sigmoid colon with extensive free air both loculated in the left pelvis as well as extending into the left upper and mid abdomen with free air abutting the medial left hemidiaphragm. Suspect ruptured diverticulitis. No mass seen. It is conceivable that a neoplastic lesion in the colon resulted in bowel wall rupture. 2.  Suspect underlying ileus.  No frank bowel obstruction. 3. Status post cholecystectomy. Prominence of the common hepatic duct with tapering of the common bile duct distally noted. No mass or calculus noted in the biliary ductal system. 4.  No evident renal or ureteral calculus.  No hydronephrosis. 5.  Aortoiliac atherosclerosis. 6.  Status post ventral hernia repair with mesh in place. 7.  Extensive multilevel lumbar arthropathy. Critical Value/emergent results were called by telephone at the time of interpretation on 09/03/2018 at 9:38 am to Dr. Francine Graven , who verbally acknowledged these results. Electronically Signed   By: Lowella Grip III M.D.   On: 08/14/2018 09:38   Dg Acute Abd 2+v Abd (supine,erect,decub) + 1v Chest  Result Date: 08/30/2018 CLINICAL DATA:  Shortness of breath and diarrhea EXAM: DG ABDOMEN ACUTE W/ 1V CHEST COMPARISON:  Chest radiograph August 08, 2018; PET-CT November 13, 2017 FINDINGS: PA chest: The nodular opacities in the left upper lobe are slightly less well evident than on most recent chest radiograph. There is atelectatic change in the left mid and upper lung zones. There is patchy infiltrate in the left mid lung currently. Right lung appears clear. Heart size and pulmonary vascularity are normal. Port-A-Cath tip is in the superior vena cava. No  pneumothorax. Patient is status post median sternotomy. No adenopathy. No bone lesions. Supine and upright abdomen: There is moderate stool in the colon. There is no bowel dilatation or air-fluid level to suggest bowel obstruction. No free air. There is degenerative change in the lumbar spine. Postoperative changes are noted in the left abdomen. IMPRESSION: Suspect early pneumonia left mid lung. The known nodular lesions in the left upper lobe are less well apparent currently than on recent prior chest radiograph. Right lung is clear. Stable cardiac silhouette. No pneumothorax. No bowel obstruction or free air. Moderate stool in colon. Postoperative changes noted. Electronically Signed   By: Lowella Grip III M.D.   On: 08/17/2018 07:25    Review of Systems  Constitutional: Positive for malaise/fatigue. Negative for chills and fever.  HENT: Negative for hearing loss.   Eyes: Negative for blurred vision and double vision.  Respiratory: Positive for shortness of breath. Negative for cough and hemoptysis.   Cardiovascular: Negative for chest pain and palpitations.  Gastrointestinal: Positive for abdominal pain. Negative for nausea and vomiting.  Genitourinary: Negative for dysuria  and urgency.  Musculoskeletal: Negative for myalgias and neck pain.  Skin: Negative for itching and rash.  Neurological: Negative for dizziness, tingling and headaches.  Endo/Heme/Allergies: Does not bruise/bleed easily.  Psychiatric/Behavioral: Negative for depression and suicidal ideas.   Blood pressure (!) 220/91, pulse (!) 128, temperature 98.5 F (36.9 C), temperature source Oral, resp. rate (!) 21, height _0  (1.6 m), weight 99.2 kg, SpO2 95 %. Physical Exam  Vitals reviewed. Constitutional: She is oriented to person, place, and time. She appears well-developed and well-nourished.  HENT:  Head: Normocephalic and atraumatic.  Eyes: Pupils are equal, round, and reactive to light. Conjunctivae and EOM are  normal.  Neck: Normal range of motion. Neck supple.  Cardiovascular: Normal rate and regular rhythm.  Respiratory: Effort normal and breath sounds normal.  GI: Soft. Bowel sounds are normal. She exhibits no distension. There is abdominal tenderness in the right upper quadrant, right lower quadrant, left upper quadrant and left lower quadrant. There is guarding.  Musculoskeletal: Normal range of motion.  Neurological: She is alert and oriented to person, place, and time.  Skin: Skin is warm and dry.  Psychiatric: She has a normal mood and affect. Her behavior is normal.      Assessment/Plan: 23 female with multiple medical problems including diabetes, hypertension, lung cancer, steroid use on high dose steroid taper due to recent pneumonitis, severe COPD presents with abdominal pain and findings consistent with perforated diverticulitis with large amount of free air and some fluid -Discussed the findings and options with the family.  I discussed that a surgery with resection and colostomy would be the standard of care this time.  I discussed that she has a high risk of morbidity and mortality with this and gave rates of greater than 50% for major mobility and 33% for mortality.  We discussed other complication issues of prolonged hospitalization, nursing home stay, wound infection, abscess, pneumonia, and heart attack.  After full discussion the family wanted to proceed with aggressive care.  We discussed living will.  She does have a living will but no DNR or DNI specifications at this time. -IV meropenem for penicillin allergy -2 OR for exploratory laparotomy with likely colectomy and colostomy -Patient will likely be in the ICU after this procedure.  Arta Bruce  08/15/2018, 4:41 PM

## 2018-08-31 NOTE — Anesthesia Procedure Notes (Signed)
Procedure Name: Intubation Date/Time: 08/19/2018 5:36 PM Performed by: Babs Bertin, CRNA Pre-anesthesia Checklist: Patient identified, Emergency Drugs available, Suction available and Patient being monitored Patient Re-evaluated:Patient Re-evaluated prior to induction Oxygen Delivery Method: Circle System Utilized Preoxygenation: Pre-oxygenation with 100% oxygen Induction Type: IV induction, Rapid sequence and Cricoid Pressure applied Laryngoscope Size: Mac and 3 Grade View: Grade III Tube type: Oral Tube size: 7.5 mm Number of attempts: 1 Airway Equipment and Method: Oral airway and Bougie stylet Placement Confirmation: ETT inserted through vocal cords under direct vision,  positive ETCO2 and breath sounds checked- equal and bilateral Secured at: 22 cm Tube secured with: Tape Dental Injury: Teeth and Oropharynx as per pre-operative assessment

## 2018-08-31 NOTE — ED Triage Notes (Signed)
Pt brought in by [facility] for c/o generalized abdominal pain x 1 day; pt has had diarrhea and took an imodium and has not had a BM today; pt is a cancer pt

## 2018-08-31 NOTE — Procedures (Signed)
Arterial Catheter Insertion Procedure Note [name] [id number] 22-Sep-1945  Procedure: Insertion of Arterial Catheter  Indications: Blood pressure monitoring  Procedure Details Consent: Risks of procedure as well as the alternatives and risks of each were explained to the (patient/caregiver).  Consent for procedure obtained. Time Out: Verified patient identification, verified procedure, site/side was marked, verified correct patient position, special equipment/implants available, medications/allergies/relevent history reviewed, required imaging and test results available.  Performed  Maximum sterile technique was used including antiseptics, cap, gloves, gown, hand hygiene, mask and sheet. Skin prep: Chlorhexidine; local anesthetic administered 20 gauge catheter was inserted into right radial artery using the Seldinger technique. ULTRASOUND GUIDANCE USED: NO Evaluation Blood flow good; BP tracing good. Complications: No apparent complications.   [name] 08/21/2018

## 2018-08-31 NOTE — Progress Notes (Signed)
Pharmacy Antibiotic Note  [name] is a 73 y.o. female admitted on 08/29/2018 with intra-abdominal infection.  Pharmacy has been consulted for meropenem dosing.  Plan: Merrem 1000 mg IV every 8 hours.  Monitor labs, c/s, and patient improvement.     Temp (24hrs), Avg:98.4 F (36.9 C), Min:98.4 F (36.9 C), Max:98.4 F (36.9 C)  Recent Labs  Lab 08/27/18 0945 08/22/2018 0732  WBC 11.9* 9.9  CREATININE 1.53* 0.95    Estimated Creatinine Clearance: 60.7 mL/min (by C-G formula based on SCr of 0.95 mg/dL).    Allergies  Allergen Reactions  . Bee Venom Anaphylaxis  . Penicillins Itching    Blisters in mouth after dental work Has patient had a PCN reaction causing immediate rash, facial/tongue/throat swelling, SOB or lightheadedness with hypotension: no Has patient had a PCN reaction causing severe rash involving mucus membranes or skin necrosis: No Has patient had a PCN reaction that required hospitalization No Has patient had a PCN reaction occurring within the last 10 years: Yes If all of the above answers are "NO", then may proceed with Cephalosporin use.   . Codeine   . Motrin [Ibuprofen]     "messes" with her head.    Antimicrobials this admission: Merrem 1/19 >>      Dose adjustments this admission: N/A  Microbiology results: 1/19 UCx: pending  1/19 GI panel PCR: pending     Thank you for allowing pharmacy to be a part of this patient's care.  Ramond Craver 08/22/2018 11:29 AM

## 2018-08-31 NOTE — ED Provider Notes (Signed)
MSE was initiated and I personally evaluated the patient and placed orders (if any) at  6:46 AM on August 31, 2018.  The patient appears stable so that the remainder of the MSE may be completed by another provider.  Patient with abdominal pain nausea and diarrhea.  She is tachycardic and hypertensive.  She has diffuse abdominal tenderness. She has a history of lung cancer.  IV fluids and IV pain medicines been ordered.  We will start with acute abdominal series.   Ripley Fraise, MD 08/30/2018 [phone]

## 2018-08-31 NOTE — Consult Note (Signed)
[place]  Reason for Consult: Perforated bowel, pneumoperitoneum  Referring Physician:  Dr. Thurnell Garbe   Chief Complaint    Abdominal Pain      [name] is a 73 y.o. female.  HPI: [name] is a 73 yo with lung cancer, recently finished and on immunotherapy. She is also being treated for pneumonitis with prednisone 59m BID currently, and has been on a taper with doses as high as 2080m  She comes in with acute worsening of abdominal pain that started about 1 week ago with associated diarrhea. She was seen by Oncology on 1/15 and has been taking imodium and had a C dif that was negative.  She says that the pain was getting worse so she came to the hospital.    CT scan demonstrated free air around the descending colon and up to the diaphragm and she was diffusely tender per Dr. McThurnell Garbeeport. I was in the OR during this evaluation and asked for Dr. TaCarles Colleto see the patient, and to evaluate her given her co-moribidities, pneumonitis on steroids, immunotherapy, and current tachycardiac to 140s.  I felt that she is highly likely to require aggressive and longterm ICU support, and [facility] Collet[facility] to see the patient and [facility] and perform surgery versus transfer after surgery.   On my exam, the patient is starting to get a little more somnolent that originally described, and her HR is down to the 120. She remains hypertensive to 20163Wystolic.   Past Medical History:  Diagnosis Date  . Anxiety   . Arthritis   . Bipolar disorder (HCYates  . Chronic back pain   . Chronic dyspnea   . COPD (chronic obstructive pulmonary disease) ([place]  . Diverticulosis   . Family history of lung cancer   . Family history of prostate cancer   . Family history of stomach cancer   . Fracture of left ankle Jan 06, 2010   hairline/ Dr. KrAlfonso Ramuss treating   . Frequent falls   .  Gait instability   . GERD (gastroesophageal reflux disease) 2000  . Hyperlipidemia 1995  . Hypertension 1995  . Nicotine addiction   . Obesity     Past Surgical History:  Procedure Laterality Date  . BACK SURGERY  1999  . BRONCHIAL BRUSHINGS Left 10/30/2017   Procedure: BRONCHIAL BRUSHINGS;  Surgeon: HaSinda DuMD;  [facility];  Service: Cardiopulmonary;  Laterality: Left;  . BRONCHIAL WASHINGS Left 10/30/2017   Procedure: BRONCHIAL WASHINGS;  Surgeon: HaSinda DuMD;  Location: [facility];  Service: Cardiopulmonary;  Laterality: Left;  . CHOLECYSTECTOMY  2001  . COLONOSCOPY N/A 02/02/2016   Procedure: COLONOSCOPY;  Surgeon: NaRogene HoustonMD;  Location: [facility];  Service: Endoscopy;  Laterality: N/A;  930  . FLEXIBLE BRONCHOSCOPY Bilateral 10/30/2017   Procedure: FLEXIBLE BRONCHOSCOPY;  Surgeon: HaSinda DuMD;  [facility];  Service: Cardiopulmonary;  Laterality: Bilateral;  . INCISIONAL HERNIA REPAIR  August 17, 2008  . left inguinal hernia herniorrhapy  1980  . PORTACATH PLACEMENT Left 02/03/2018   Procedure: INSERTION PORT-A-CATH;  Surgeon: JeAviva SignsMD;  Location: [facility];  Service: General;  Laterality: Left;  . removal of thmoma  03/27/2010   Dr. BuArlyce Dice . SPINE SURGERY    . TONSILLECTOMY    . TOTAL ABDOMINAL HYSTERECTOMY  W/ BILATERAL SALPINGOOPHORECTOMY  1984    Family History  Problem Relation Age of Onset  . Heart disease Mother        enlarged  . Diabetes Mother   . Hypertension Mother   . Cancer Father        throat cancer - smoker  . Hypertension Father   . Coronary artery disease Father   . Diabetes Sister        x3  . Asthma Sister   . Lung cancer Sister 40       d. 70; smoker  . Kidney disease Brother   . Stomach cancer Sister        dx in her 21s-50s  . Stroke Brother   . Prostate cancer Brother 66       pat 1/2 brother  . Thyroid cancer Maternal Aunt        dx and d. in her 12s  . Bone cancer  Maternal Uncle   . Bone cancer Maternal Grandfather   . Cancer Cousin        NOS - had a swollen stomach    Social History   Tobacco Use  . Smoking status: Former Smoker    Packs/day: 1.50    Years: 54.00    Pack years: 81.00    Types: Cigarettes    Last attempt to quit: 08/20/2017    Years since quitting: 1.0  . Smokeless tobacco: Never Used  Substance Use Topics  . Alcohol use: No    Alcohol/week: 0.0 standard drinks  . Drug use: No    Medications:  I have reviewed the patient's current medications. Current Facility-Administered Medications  Medication Dose Route Frequency Provider Last Rate Last Dose  . 0.9 %  sodium chloride infusion   Intravenous Continuous Francine Graven, DO 100 mL/hr at 08/24/2018 0831    . diltiazem (CARDIZEM CD) 24 hr capsule 120 mg  120 mg Oral Daily Francine Graven, DO   120 mg at 09/08/2018 0919  . meropenem (MERREM) 1 g in sodium chloride 0.9 % 100 mL IVPB  1 g Intravenous Q8H Francine Graven, DO   Stopped at 08/25/2018 1110  . methylPREDNISolone sodium succinate (SOLU-MEDROL) 125 mg/2 mL injection 80 mg  80 mg Intravenous Once Tat, David, MD      . sodium chloride 0.9 % bolus 500 mL  500 mL Intravenous Once Francine Graven, DO       Current Outpatient Medications  Medication Sig Dispense Refill Last Dose  . albuterol (PROAIR HFA) 108 (90 Base) MCG/ACT inhaler INHALE 2 PUFFS BY MOUTH EVERY 6 HOURS IF NEEDED (Patient taking differently: Inhale 2 puffs into the lungs every 6 (six) hours as needed for wheezing or shortness of breath. ) 8.5 g 0 08/29/2018 at Unknown time  . albuterol (PROVENTIL) (2.5 MG/3ML) 0.083% nebulizer solution USE 1 VIAL IN NEBULIZER EVERY 6 HOURS AS NEEDED. 375 mL 0 08/29/2018 at Unknown time  . ALPRAZolam (XANAX) 1 MG tablet Take 1 tablet (1 mg total) by mouth 2 (two) times daily as needed for anxiety. 60 tablet 5 08/30/2018 at Unknown time  . aspirin (ASPIRIN LOW DOSE) 81 MG EC tablet Take 81 mg by mouth daily.     08/30/2018 at  Unknown time  . Cholecalciferol (VITAMIN D3) 50 MCG (2000 UT) capsule Take 2,000 Units by mouth daily.   08/30/2018 at Unknown time  . diltiazem (CARDIZEM CD) 120 MG 24 hr capsule Take 1 capsule (120 mg total) by mouth every morning.  08/30/2018 at Unknown time  . diphenhydrAMINE (BENADRYL) 25 mg capsule Take 25-50 mg by mouth at bedtime as needed for sleep.   08/30/2018 at Unknown time  . docusate sodium (COLACE) 100 MG capsule Take 100-200 mg by mouth 2 (two) times daily as needed for mild constipation or moderate constipation.    08/30/2018 at Unknown time  . FLUoxetine (PROZAC) 20 MG capsule Take 1 capsule (20 mg total) by mouth every morning.   08/30/2018 at Unknown time  . guaiFENesin (MUCINEX) 600 MG 12 hr tablet Take 1 tablet (600 mg total) by mouth 2 (two) times daily. 30 tablet 0 08/30/2018 at Unknown time  . HYDROcodone-acetaminophen (NORCO/VICODIN) 5-325 MG tablet Take one tablet three times daily for back pain (Patient taking differently: Take 1 tablet by mouth every 8 (eight) hours as needed. Take one tablet three times daily for back pain) 90 tablet 0 08/30/2018 at Unknown time  . Insulin Glargine (LANTUS) 100 UNIT/ML Solostar Pen Inject 50 Units into the skin at bedtime. 15 mL 11 08/30/2018 at 0900pm  . ipratropium (ATROVENT) 0.02 % nebulizer solution INHALE ONE VIAL VIA NEBULIZER EVERY 6 HOURS AS NEEDED. (Patient taking differently: Take 0.5 mg by nebulization every 6 (six) hours as needed for wheezing or shortness of breath. ) 312.5 mL 0 08/29/2018 at Unknown time  . lactulose (CHRONULAC) 10 GM/15ML solution Take 15-94m at bedtime every night to assist with moving bowels.  Titrate down if having multiple bowel movements. (Patient taking differently: Take 10 g by mouth 2 (two) times daily as needed for mild constipation. Take 15-343mat bedtime every night to assist with moving bowels.  Titrate down if having multiple bowel movements.) 240 mL 3 08/30/2018 at Unknown time  . lidocaine-prilocaine  (EMLA) cream Apply a quarter size amount to port site 1 hour prior to chemo. Do not rub in. Cover with plastic wrap. 30 g 3 Past Month at Unknown time  . lisinopril (PRINIVIL,ZESTRIL) 10 MG tablet Take 1 tablet (10 mg total) by mouth every morning.   08/30/2018 at Unknown time  . loperamide (IMODIUM) 2 MG capsule Take 2 mg by mouth as needed for diarrhea or loose stools.   08/30/2018 at Unknown time  . metFORMIN (GLUCOPHAGE) 1000 MG tablet Take 1 tablet (1,000 mg total) by mouth 2 (two) times daily with a meal. 180 tablet 3 08/30/2018 at Unknown time  . omeprazole (PRILOSEC) 40 MG capsule Take 1 capsule (40 mg total) by mouth every morning.   08/30/2018 at Unknown time  . predniSONE (DELTASONE) 20 MG tablet Take 4 tablets (80 mg total) by mouth 2 (two) times daily with a meal. (Patient taking differently: Take 40 mg by mouth 2 (two) times daily with a meal. ) 120 tablet 0 08/30/2018 at Unknown time  . prochlorperazine (COMPAZINE) 10 MG tablet Take 10 mg by mouth every 6 (six) hours as needed for nausea or vomiting.   08/30/2018 at Unknown time  . rosuvastatin (CRESTOR) 20 MG tablet Take 1 tablet (20 mg total) by mouth at bedtime.   08/30/2018 at Unknown time  . sulfamethoxazole-trimethoprim (BACTRIM DS,SEPTRA DS) 800-160 MG tablet Take 1 tablet by mouth 3 (three) times a week. (Patient taking differently: Take 1 tablet by mouth 3 (three) times a week. Take 1 tablet Monday, Wednesday, Friday.) 12 tablet 1 Past Week at Unknown time  . SYMBICORT 80-4.5 MCG/ACT inhaler INHALE 2 PUFFS BY MOUTH TWICE DAILY( RINSE MOUTH AFTER USE) (Patient taking differently: Inhale 2 puffs into the lungs 2 (two)  times daily. ) 10.2 g 2 08/30/2018 at Unknown time  . tiZANidine (ZANAFLEX) 4 MG tablet TAKE 1 TABLET(4 MG) BY MOUTH THREE TIMES DAILY (Patient taking differently: Take 4 mg by mouth 3 (three) times daily. ) 270 tablet 1 08/30/2018 at Unknown time  . celecoxib (CELEBREX) 400 MG capsule Take 400 mg by mouth daily.    Not Taking  at Unknown time  . furosemide (LASIX) 20 MG tablet Take 2 tablets (40 mg total) by mouth every morning. (Patient not taking: Reported on 08/15/2018)   Not Taking at Unknown time  . potassium chloride SA (K-DUR,KLOR-CON) 20 MEQ tablet Take 1 tablet (20 mEq total) by mouth every morning. (Patient not taking: Reported on 08/17/2018)   Not Taking at Unknown time   Scheduled: . diltiazem  120 mg Oral Daily  . methylPREDNISolone (SOLU-MEDROL) injection  80 mg Intravenous Once   Continuous: . sodium chloride 100 mL/hr at 08/15/2018 0831  . meropenem (MERREM) IV Stopped (09/04/2018 1110)  . sodium chloride     Allergies  Allergen Reactions  . Bee Venom Anaphylaxis  . Penicillins Itching    Blisters in mouth after dental work Has patient had a PCN reaction causing immediate rash, facial/tongue/throat swelling, SOB or lightheadedness with hypotension: no Has patient had a PCN reaction causing severe rash involving mucus membranes or skin necrosis: No Has patient had a PCN reaction that required hospitalization No Has patient had a PCN reaction occurring within the last 10 years: Yes If all of the above answers are "NO", then may proceed with Cephalosporin use.   . Codeine   . Motrin [Ibuprofen]     "messes" with her head.      ROS:  A comprehensive review of systems was negative except for: Respiratory: positive for lung cancer, pneumonitis Cardiovascular: positive for troponin to 0.05 Gastrointestinal: positive for abdominal pain and diarrhea  Blood pressure (!) 188/91, pulse (!) 126, temperature 98.4 F (36.9 C), temperature source Oral, resp. rate 20, SpO2 97 %. Physical Exam Vitals signs reviewed.  Constitutional:      Appearance: She is ill-appearing.  HENT:     Head: Normocephalic and atraumatic.  Eyes:     Extraocular Movements: Extraocular movements intact.  Cardiovascular:     Rate and Rhythm: Tachycardia present.  Pulmonary:     Effort: Pulmonary effort is normal.   Abdominal:     Palpations: Abdomen is soft.     Tenderness: There is generalized abdominal tenderness. There is guarding.     Hernia: A hernia is present.  Musculoskeletal: Normal range of motion.     Right lower leg: No edema.     Left lower leg: No edema.  Skin:    General: Skin is warm and dry.  Neurological:     General: No focal deficit present.     Mental Status: She is oriented to person, place, and time and easily aroused.  Psychiatric:        Mood and Affect: Mood normal.        Behavior: Behavior normal.     Results: Results for orders placed or performed during the hospital encounter of 09/08/2018 (from the past 48 hour(s))  CBC with Differential/Platelet     Status: Abnormal   Collection Time: 09/10/2018  7:32 AM  Result Value Ref Range   WBC 9.9 4.0 - 10.5 K/uL    Comment: WHITE COUNT CONFIRMED ON SMEAR   RBC 4.80 3.87 - 5.11 MIL/uL   Hemoglobin 13.0 12.0 - 15.0 g/dL  HCT 42.1 36.0 - 46.0 %   MCV 87.7 80.0 - 100.0 fL   MCH 27.1 26.0 - 34.0 pg   MCHC 30.9 30.0 - 36.0 g/dL   RDW 16.0 (H) 11.5 - 15.5 %   Platelets 158 150 - 400 K/uL   nRBC 0.9 (H) 0.0 - 0.2 %   Neutrophils Relative % 80 %   Neutro Abs 8.0 (H) 1.7 - 7.7 K/uL   Lymphocytes Relative 10 %   Lymphs Abs 1.0 0.7 - 4.0 K/uL   Monocytes Relative 3 %   Monocytes Absolute 0.3 0.1 - 1.0 K/uL   Eosinophils Relative 1 %   Eosinophils Absolute 0.1 0.0 - 0.5 K/uL   Basophils Relative 1 %   Basophils Absolute 0.1 0.0 - 0.1 K/uL   WBC Morphology DOHLE BODIES     Comment: MILD LEFT SHIFT (1-5% METAS, OCC MYELO, OCC BANDS)   Immature Granulocytes 5 %   Abs Immature Granulocytes 0.53 (H) 0.00 - 0.07 K/uL   Reactive, Benign Lymphocytes PRESENT     Comment: Performed at [facility], [address], [place], [place] [address]  Comprehensive metabolic panel     Status: Abnormal   Collection Time: 08/25/2018  7:32 AM  Result Value Ref Range   Sodium 129 (L) 135 - 145 mmol/L   Potassium 4.3 3.5 - 5.1 mmol/L    Chloride 91 (L) 98 - 111 mmol/L   CO2 21 (L) 22 - 32 mmol/L   Glucose, Bld 258 (H) 70 - 99 mg/dL   BUN 33 (H) 8 - 23 mg/dL   Creatinine, Ser 0.95 0.44 - 1.00 mg/dL   Calcium 9.5 8.9 - 10.3 mg/dL   Total Protein 6.1 (L) 6.5 - 8.1 g/dL   Albumin 3.3 (L) 3.5 - 5.0 g/dL   AST 35 15 - 41 U/L   ALT 44 0 - 44 U/L   Alkaline Phosphatase 59 38 - 126 U/L   Total Bilirubin 0.9 0.3 - 1.2 mg/dL   GFR calc non Af Amer >60 >60 mL/min   GFR calc Af Amer >60 >60 mL/min   Anion gap 17 (H) 5 - 15    Comment: Performed at [facility], [address], [place], [place] [address]  Lipase, blood     Status: None   Collection Time: 09/09/2018  7:32 AM  Result Value Ref Range   Lipase 22 11 - 51 U/L    Comment: Performed at [facility], [address] [place], [place] [address]  Troponin I - Once     Status: Abnormal   Collection Time: 08/22/2018  7:32 AM  Result Value Ref Range   Troponin I 0.05 (HH) <0.03 ng/mL    Comment: CRITICAL RESULT CALLED TO, READ BACK BY AND VERIFIED WITH: WHITE,M_0  BY MATTHEWS,B 1.19.2020 Performed at [facility], [address], [place], [place] [address]   Magnesium     Status: Abnormal   Collection Time: 09/12/2018  7:32 AM  Result Value Ref Range   Magnesium 1.3 (L) 1.7 - 2.4 mg/dL    Comment: Performed at [facility], [address], [place],  [address]  Urinalysis, Routine w reflex microscopic     Status: Abnormal   Collection Time: 08/18/2018  8:00 AM  Result Value Ref Range   Color, Urine YELLOW YELLOW   APPearance HAZY (A) CLEAR   Specific Gravity, Urine 1.022 1.005 - 1.030   pH 5.0 5.0 - 8.0   Glucose, UA >=500 (A) NEGATIVE mg/dL   Hgb urine  dipstick NEGATIVE NEGATIVE   Bilirubin Urine NEGATIVE NEGATIVE   Ketones, ur NEGATIVE NEGATIVE mg/dL   Protein, ur NEGATIVE NEGATIVE mg/dL   Nitrite NEGATIVE NEGATIVE   Leukocytes, UA TRACE (A) NEGATIVE   RBC / HPF 0-5 0 - 5 RBC/hpf   WBC, UA 6-10 0 - 5 WBC/hpf   Bacteria, UA RARE (A) NONE SEEN   Squamous  Epithelial / LPF 0-5 0 - 5   Mucus PRESENT     Comment: Performed at [facility], [address], [place], [place] [address]   Personally reviewed CT- patient with free air around descending colon, some fluid in the pelvis Ct Abdomen Pelvis W Contrast  Result Date: 09/02/2018 CLINICAL DATA:  Generalized abdominal pain. History of lung carcinoma EXAM: CT ABDOMEN AND PELVIS WITH CONTRAST TECHNIQUE: Multidetector CT imaging of the abdomen and pelvis was performed using the standard protocol following bolus administration of intravenous contrast. CONTRAST:  163m ISOVUE-300 IOPAMIDOL (ISOVUE-300) INJECTION 61% COMPARISON:  PET-CT November 13, 2017 FINDINGS: Lower chest: Lung bases are clear. Hepatobiliary: No focal liver lesions are evident. Gallbladder is absent. The common hepatic is dilated at 12 mm. There is tapering distally without biliary duct mass or calculus. No intrahepatic biliary duct dilatation is evident. Pancreas: There is no pancreatic mass or inflammatory focus. Spleen: No splenic lesions are evident. Adrenals/Urinary Tract: Adrenals appear normal bilaterally. Kidneys bilaterally show no evident mass or hydronephrosis on either side. There is no appreciable renal or ureteral calculus on either side. Urinary bladder is midline with wall thickness within normal limits. Stomach/Bowel: There is evident bowel perforation at the level of the junction of the descending colon and sigmoid colon. Soft tissue air tracks from this area into the left pelvis with surrounding mesenteric stranding. Free air is seen extending throughout the left abdomen into the mid and upper abdominal regions, reaching the medial left hemidiaphragm near the level of the stomach. There is no associated mass by CT in this area. Elsewhere, no other areas of perforation or evident. There is fluid throughout much of the small bowel which may be indicative of a degree of ileus secondary to the bowel perforation more distally. No portal  venous air is evident. Vascular/Lymphatic: There is aortic atherosclerosis. There is also right common iliac artery atherosclerosis. Major mesenteric arterial vessels appear patent. No adenopathy is appreciable in the abdomen or pelvis. Reproductive: The uterus is absent.  No pelvic masses evident. Other: There is no periappendiceal region inflammation. A surgical clip is seen in the periappendiceal region. Appendix not seen. No abscess or ascites is evident in the abdomen or pelvis. Patient has had previous hernia repair in the periumbilical region with areas of mesh present. Musculoskeletal: There is multilevel arthropathy in the lumbar spine. No blastic or lytic bone lesions are evident. No intramuscular lesions are evident. IMPRESSION: 1. Evident bowel perforation at the junction of the descending colon and sigmoid colon with extensive free air both loculated in the left pelvis as well as extending into the left upper and mid abdomen with free air abutting the medial left hemidiaphragm. Suspect ruptured diverticulitis. No mass seen. It is conceivable that a neoplastic lesion in the colon resulted in bowel wall rupture. 2.  Suspect underlying ileus.  No frank bowel obstruction. 3. Status post cholecystectomy. Prominence of the common hepatic duct with tapering of the common bile duct distally noted. No mass or calculus noted in the biliary ductal system. 4.  No evident renal or ureteral calculus.  No hydronephrosis. 5.  Aortoiliac  atherosclerosis. 6.  Status post ventral hernia repair with mesh in place. 7.  Extensive multilevel lumbar arthropathy. Critical Value/emergent results were called by telephone at the time of interpretation on 08/26/2018 at 9:38 am to Dr. Francine Graven , who verbally acknowledged these results. Electronically Signed   By: Lowella Grip III M.D.   On: 09/05/2018 09:38   Dg Acute Abd 2+v Abd (supine,erect,decub) + 1v Chest  Result Date: 08/21/2018 CLINICAL DATA:  Shortness of  breath and diarrhea EXAM: DG ABDOMEN ACUTE W/ 1V CHEST COMPARISON:  Chest radiograph August 08, 2018; PET-CT November 13, 2017 FINDINGS: PA chest: The nodular opacities in the left upper lobe are slightly less well evident than on most recent chest radiograph. There is atelectatic change in the left mid and upper lung zones. There is patchy infiltrate in the left mid lung currently. Right lung appears clear. Heart size and pulmonary vascularity are normal. Port-A-Cath tip is in the superior vena cava. No pneumothorax. Patient is status post median sternotomy. No adenopathy. No bone lesions. Supine and upright abdomen: There is moderate stool in the colon. There is no bowel dilatation or air-fluid level to suggest bowel obstruction. No free air. There is degenerative change in the lumbar spine. Postoperative changes are noted in the left abdomen. IMPRESSION: Suspect early pneumonia left mid lung. The known nodular lesions in the left upper lobe are less well apparent currently than on recent prior chest radiograph. Right lung is clear. Stable cardiac silhouette. No pneumothorax. No bowel obstruction or free air. Moderate stool in colon. Postoperative changes noted. Electronically Signed   By: Lowella Grip III M.D.   On: 08/14/2018 07:25     Assessment & Plan:  FARRA NIKOLIC is a 73 y.o. female with perforated bowel like from diverticulitis with impending septic shock. She is currently tachycardic and  Responded a little to fluids, and is becoming less awake than prior. She will have a long ICU stay and intubation based on her history and her steroid/ immunotherapy use.  This cannot be given adequately at [facility]. We appreciate [facility] accepting the transfer and Dr. Kieth Brightly for accepting her for surgical evaluation.   -Discussed with the family the likelihood of septic shock, pressors, CVL, Arterial line, and potential for prolonged intubation and death  -Patient and family expressed understanding and  awaiting transfer   All questions were answered to the satisfaction of the patient and family.   [name] 09/08/2018, 11:33 AM

## 2018-08-31 NOTE — ED Notes (Signed)
Pt left [facility] with [facility] going to [place].

## 2018-08-31 NOTE — Anesthesia Preprocedure Evaluation (Signed)
Anesthesia Evaluation  Patient identified by MRN, date of birth, ID band Patient awake    Reviewed: Allergy & Precautions, H&P , NPO status , Patient's Chart, lab work & pertinent test results  Airway Mallampati: II  TM Distance: >3 FB Neck ROM: full    Dental  (+) Dental Advisory Given   Pulmonary neg pulmonary ROS, COPD, former smoker,    Pulmonary exam normal breath sounds clear to auscultation       Cardiovascular Exercise Tolerance: Good hypertension, negative cardio ROS   Rhythm:regular Rate:Normal     Neuro/Psych PSYCHIATRIC DISORDERS Anxiety Depression Bipolar Disorder  Neuromuscular disease negative neurological ROS  negative psych ROS   GI/Hepatic negative GI ROS, Neg liver ROS, GERD  ,  Endo/Other  diabetes, Type 2  Renal/GU Renal diseasenegative Renal ROS  negative genitourinary   Musculoskeletal   Abdominal (+) + obese,   Peds  Hematology negative hematology ROS (+) anemia ,   Anesthesia Other Findings   Reproductive/Obstetrics negative OB ROS                             Anesthesia Physical  Anesthesia Plan  ASA: III  Anesthesia Plan: General   Post-op Pain Management:    Induction: Intravenous, Rapid sequence and Cricoid pressure planned  PONV Risk Score and Plan: 4 or greater and Ondansetron, Dexamethasone and Treatment may vary due to age or medical condition  Airway Management Planned: Oral ETT  Additional Equipment:   Intra-op Plan:   Post-operative Plan: Extubation in OR  Informed Consent: I have reviewed the patients History and Physical, chart, labs and discussed the procedure including the risks, benefits and alternatives for the proposed anesthesia with the patient or authorized representative who has indicated his/her understanding and acceptance.     Dental advisory given  Plan Discussed with: CRNA  Anesthesia Plan Comments:          Anesthesia Quick Evaluation

## 2018-08-31 NOTE — Op Note (Signed)
Preoperative diagnosis: perforated diverticulitis  Postoperative diagnosis: same   Procedure: exploratory laparotomy, partial colectomy with end colostomy, splenic mobilization, lysis of adhesions, placement of 100cm^2 vacuum dressing  Surgeon: Gurney Maxin, M.D.  Asst: Romana Juniper  Anesthesia: general  Indications for procedure: [name] is a 73 y.o. year old female with symptoms of abdominal pain and findings of free air and concern for perforated diverticulitis.  Description of procedure: The patient was brought into the operative suite. Anesthesia was administered with General endotracheal anesthesia. WHO checklist was applied. The patient was then placed in supine position. The area was prepped and draped in the usual sterile fashion.  Next, a midline incision was made. Cautery was used to dissect through the subcutaneous tissues and fascia. The fascia was safely entered. Over the inferior aspect of the midline mesh was encountered and divided. Omentum was stuck to the mesh and this was dissected free with blunt dissection and cautery. There appeared to be a large abscess in the mesentery of the sigmoid colon. Next, the left White line of Toldt was incised moving proximal. A location of healthy colon was identified for proximal margin. Further blunt dissection distally freed the sigmoid and proximal rectum from the surrounding contents. A blue contour was used to divide the colon and ligasure was used to divide the mesentery staying close to the intestine. The abscess appeared to be from the proximal sigmoid and so the splenic flexure was mobilized to get enough length using cautery and blunt dissection. Next, a place on the descending colon was identified and divided using blue contour device and specimen was sent off. The small intestine was run and multiple interloop adhesions were seen and divided with cautery. No other abnormality was seen with the small intestine. A drain was  brought through the left lower quadrant and brought into the pelvis and along the left abdominal wall/dissection area. There was more blood than expected from the left upper quadrant. On inspection there was a 38mm tear on the inferior aspect of the spleen. Cautery was used for hemostasis, then a piece of snow was put in place and the area was packed. On reexamination the area hemostasis was intact.  A location on the left abdominal wall was chosen for colostomy location. A circular incision was made in the skin and blunt dissection was used to identify the anterior rectus sheath. A cruciate incision was made through the anterior rectus sheath, the muscle fibers were bluntly dissected to identify the posterior rectus sheath and a cruciate incision was made in the posterior rectus sheath and dilated to fit the colon. The colon was passed through the site and clamped extracorporeally.  Next, the abdomen was irrigated with warm saline. The rectal stump was sutured with a 2-0 prolene at each end for future identification. The fascia was closed using a looped 0 PDS in running fashion. The subcutaneous wound was covered with a towel. The ostomy was matured with 3-0 vicryl with a small amount of Brooking in the cardinal direction.The skin was left open and packed with a black sponge vac for a wound 22 x 6cm in size.  Findings: perforated diverticulitis of proximal sigmoid  Specimen: sigmoid colon  Implant: 19 fr blake drain, wound vac   Blood loss: 266ml  Local anesthesia: none  Complications: none  Gurney Maxin, M.D. General, Bariatric, & Minimally Invasive Surgery [facility]

## 2018-08-31 NOTE — Anesthesia Postprocedure Evaluation (Signed)
Anesthesia Post Note  Patient: [name]  Procedure(s) Performed: EXPLORATORY LAPAROTOMY; COLECTOMY WITH COLOSTOMY CREATION/HARTMANN PROCEDURE; LYSIS OF ADHESION; PLACEMENT OF VACUUM DRESSING, 100 CM2 (N/A )     Anesthesia Post Evaluation  Last Vitals:  Vitals:   08/18/2018 2110 09/10/2018 2127  BP: 108/60   Pulse: (!) 124   Resp: (!) 22   Temp:    SpO2: 97% 100%    Last Pain:  Vitals:   09/05/2018 1457  TempSrc:   PainSc: 10-Worst pain ever                 Nolon Nations

## 2018-08-31 NOTE — ED Notes (Signed)
Per EMS pt did not take her Cardizem this AM

## 2018-08-31 NOTE — Transfer of Care (Signed)
Immediate Anesthesia Transfer of Care Note  Patient: [name]  Procedure(s) Performed: EXPLORATORY LAPAROTOMY; COLECTOMY WITH COLOSTOMY CREATION/HARTMANN PROCEDURE; LYSIS OF ADHESION; PLACEMENT OF VACUUM DRESSING, 100 CM2 (N/A )  Patient Location: PACU  Anesthesia Type:General  Level of Consciousness: awake, alert  and oriented  Airway & Oxygen Therapy: Patient Spontanous Breathing and Patient connected to face mask oxygen  Post-op Assessment: Report given to RN and Post -op Vital signs reviewed and stable  Post vital signs: Reviewed and stable  Last Vitals:  Vitals Value Taken Time  BP 162/122 09/01/2018  7:57 PM  Temp    Pulse 143 08/17/2018  7:58 PM  Resp 22 08/26/2018  7:58 PM  SpO2 98 % 08/24/2018  7:58 PM  Vitals shown include unvalidated device data.  Last Pain:  Vitals:   09/10/2018 1457  TempSrc:   PainSc: 10-Worst pain ever         Complications: No apparent anesthesia complications

## 2018-08-31 NOTE — H&P (Signed)
History and Physical  YOVANA SCOGIN AVW:[id number] DOB: Dec 01, 1945 DOA: 09/06/2018   PCP: Fayrene Helper, MD   Patient coming from: Home  Chief Complaint: Abdominal pain  HPI:  ANGIE PIERCEY is a 73 y.o. 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  Later on, the patient had actually stated that she has had loose stools for approximately 2 weeks.  She has not been on any recent antibiotics.  The patient went to see her oncologist on 08/27/2018.  She was placed on Imodium with some improvement of her diarrhea.  C. difficile assay and stool cultures were unremarkable on 08/27/2018.  Unfortunately, the patient's abdominal pain persisted.  As result, she came to the emergency department for further evaluation.  She denies any fevers, chills, chest pain, nausea, vomiting, hematochezia, melena.  The patient has chronic shortness of breath.  She states it is not any worse than usual.  During her stay in the emergency department, the patient did have a bowel movement without any blood. Evaluation in the emergency department including a CT of the abdomen pelvis showed a bowel perforation at the level of the descending colon and sigmoid colon with air tracking up to the left pelvis with surrounding mesenteric stranding.  This was thought to be possibly due to ruptured diverticulitis.  There is also made note made of small bowel ileus.  As result, general surgery was consulted to assist with management.  Patient was afebrile and hemodynamically stable.  In fact she was hypertensive with blood pressure of 191/77.  WBC was 9.9.  Hemoglobin was 13.0.  Platelets 158,000.  Sodium was 129.  Serum creatinine 0.95.  Because of the patient's complicated medical history, COPD, lung cancer, there was concern the patient may need prolonged intubation  and ICU care postoperatively.  As result, transfer to [facility] was requested.  I spoke with general surgery, Dr.  Kieth Brightly, who agreed to see the patient in consult after transfer.  Assessment/Plan: Diverticulitis with perforation -Start meropenem -npo -Continue IV fluids -I discussed the case with general surgery, Dr. Norva Riffle graciously agreed to see the patient in consult after transfer to 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 -Hydralazine as needed SBP >180   Anxiety/depression -Resume alprazolam, fluoxetine when able to tolerate p.o.  Hyperlipidemia -Resume Crestor when able to tolerate p.o.  COPD -Patient has a mild amount of wheeze on exam but denies any worsening shortness of breath -Start Pulmicort -Start bronchodilators  Hypomagnesemia -replete  Hyponatremia -due to volume depletion -start NS   Past Medical History:  Diagnosis  Date  . Anxiety   . Arthritis   . Bipolar disorder ([place])   . Chronic back pain   . Chronic  dyspnea   . COPD (chronic obstructive pulmonary disease) ([place])   . Diverticulosis   . Family history of lung cancer   . Family history of prostate cancer   . Family history of stomach cancer   . 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 Frequent falls   . Gait instability   . GERD (gastroesophageal reflux disease) 2000  . Hyperlipidemia 1995  . Hypertension 1995  . Nicotine addiction   . Obesity    Past Surgical History:  Procedure Laterality Date  . BACK SURGERY  1999  . BRONCHIAL BRUSHINGS Left 10/30/2017   Procedure: BRONCHIAL BRUSHINGS;  Surgeon: [name], MD;  Location: [facility];  Service: Cardiopulmonary;  Laterality: Left;  . BRONCHIAL WASHINGS Left 10/30/2017   Procedure: BRONCHIAL WASHINGS;  Surgeon: [name], MD;  Location: [facility];  Service: Cardiopulmonary;  Laterality: Left;  . CHOLECYSTECTOMY  2001  . COLONOSCOPY N/A 02/02/2016   Procedure: COLONOSCOPY;  Surgeon: [name], MD;  Location: [facility];  Service: Endoscopy;  Laterality: N/A;  930  . FLEXIBLE BRONCHOSCOPY Bilateral 10/30/2017   Procedure: FLEXIBLE BRONCHOSCOPY;  Surgeon: [name], MD;  Location: [facility];  Service: Cardiopulmonary;  Laterality: Bilateral;  . INCISIONAL HERNIA REPAIR  August 17, 2008  . left inguinal hernia herniorrhapy  1980  . PORTACATH PLACEMENT Left 02/03/2018   Procedure: INSERTION PORT-A-CATH;  Surgeon: [name], MD;  Location: [facility] ORS;  Service: General;  Laterality: Left;  . removal of thmoma  03/27/2010   Dr. Arlyce Dice   . SPINE SURGERY    . TONSILLECTOMY    . TOTAL ABDOMINAL HYSTERECTOMY W/ BILATERAL SALPINGOOPHORECTOMY  1984   Social History:  reports that she quit smoking about a year ago. Her smoking use included cigarettes. She has a 81.00 pack-year smoking history. She has never used smokeless tobacco. She reports that she does not drink alcohol or use drugs.   Family History  Problem Relation  Age of Onset  . Heart disease Mother        enlarged  . Diabetes Mother   . Hypertension Mother   . Cancer Father        throat cancer - smoker  . Hypertension Father   . Coronary artery disease Father   . Diabetes Sister        x3  . Asthma Sister   . Lung cancer Sister 54       d. 41; smoker  . Kidney disease Brother   . Stomach cancer Sister        dx in her 55s-50s  . Stroke Brother   . Prostate cancer Brother 103       pat 1/2 brother  . Thyroid cancer Maternal Aunt        dx and d. in her 98s  . Bone cancer Maternal Uncle   . Bone cancer Maternal Grandfather   . Cancer Cousin        NOS - had a swollen stomach     Allergies  Allergen Reactions  . Bee Venom Anaphylaxis  . Penicillins Itching    Blisters in mouth after dental work Has patient had a PCN reaction causing immediate rash, facial/tongue/throat swelling, SOB or lightheadedness with hypotension: no Has patient had a PCN reaction causing severe rash  involving mucus membranes or skin necrosis: No Has patient had a PCN reaction that required hospitalization No Has patient had a PCN reaction occurring within the last 10 years: Yes If all of the above answers are "NO", then may proceed with Cephalosporin use.   . Codeine   . Motrin [Ibuprofen]     "messes" with her head.     Prior to Admission medications   Medication Sig Start Date End Date Taking? Authorizing Provider  albuterol (PROAIR HFA) 108 (90 Base) MCG/ACT inhaler INHALE 2 PUFFS BY MOUTH EVERY 6 HOURS IF NEEDED Patient taking differently: Inhale 2 puffs into the lungs every 6 (six) hours as needed for wheezing or shortness of breath.  03/18/18  Yes Fayrene Helper, MD  albuterol (PROVENTIL) (2.5 MG/3ML) 0.083% nebulizer solution USE 1 VIAL IN NEBULIZER EVERY 6 HOURS AS NEEDED. 07/25/18  Yes Fayrene Helper, MD  ALPRAZolam Duanne Moron) 1 MG tablet Take 1 tablet (1 mg total) by mouth 2 (two) times daily as needed for anxiety. 08/08/18 08/08/19 Yes  Fayrene Helper, MD  aspirin (ASPIRIN LOW DOSE) 81 MG EC tablet Take 81 mg by mouth daily.     Yes [provider]  Cholecalciferol (VITAMIN D3) 50 MCG (2000 UT) capsule Take 2,000 Units by mouth daily.   Yes [provider]  diltiazem (CARDIZEM CD) 120 MG 24 hr capsule Take 1 capsule (120 mg total) by mouth every morning. 07/25/18  Yes Johnson, Clanford L, MD  diphenhydrAMINE (BENADRYL) 25 mg capsule Take 25-50 mg by mouth at bedtime as needed for sleep.   Yes [provider]  docusate sodium (COLACE) 100 MG capsule Take 100-200 mg by mouth 2 (two) times daily as needed for mild constipation or moderate constipation.    Yes [provider]  FLUoxetine (PROZAC) 20 MG capsule Take 1 capsule (20 mg total) by mouth every morning. 07/25/18  Yes Johnson, Clanford L, MD  guaiFENesin (MUCINEX) 600 MG 12 hr tablet Take 1 tablet (600 mg total) by mouth 2 (two) times daily. 11/01/17  Yes Kathie Dike, MD  HYDROcodone-acetaminophen (NORCO/VICODIN) 5-325 MG tablet Take one tablet three times daily for back pain Patient taking differently: Take 1 tablet by mouth every 8 (eight) hours as needed. Take one tablet three times daily for back pain 08/08/18 09/07/18 Yes Fayrene Helper, MD  Insulin Glargine (LANTUS) 100 UNIT/ML Solostar Pen Inject 50 Units into the skin at bedtime. 08/11/18  Yes Memon, Jolaine Artist, MD  ipratropium (ATROVENT) 0.02 % nebulizer solution INHALE ONE VIAL VIA NEBULIZER EVERY 6 HOURS AS NEEDED. Patient taking differently: Take 0.5 mg by nebulization every 6 (six) hours as needed for wheezing or shortness of breath.  07/25/18  Yes Fayrene Helper, MD  lactulose (CHRONULAC) 10 GM/15ML solution Take 15-60m at bedtime every night to assist with moving bowels.  Titrate down if having multiple bowel movements. Patient taking differently: Take 10 g by mouth 2 (two) times daily as needed for mild constipation. Take 15-385mat bedtime every night to assist  with moving bowels.  Titrate down if having multiple bowel movements. 03/05/18  Yes KaDerek JackMD  lidocaine-prilocaine (EMLA) cream Apply a quarter size amount to port site 1 hour prior to chemo. Do not rub in. Cover with plastic wrap. 01/22/18  Yes KaDerek JackMD  lisinopril (PRINIVIL,ZESTRIL) 10 MG tablet Take 1 tablet (10 mg total) by mouth every morning. 07/25/18  Yes Johnson, Clanford L, MD  loperamide (IMODIUM) 2 MG capsule Take 2 mg by  mouth as needed for diarrhea or loose stools.   Yes [provider]  metFORMIN (GLUCOPHAGE) 1000 MG tablet Take 1 tablet (1,000 mg total) by mouth 2 (two) times daily with a meal. 08/20/18  Yes Fayrene Helper, MD  omeprazole (PRILOSEC) 40 MG capsule Take 1 capsule (40 mg total) by mouth every morning. 07/25/18  Yes Johnson, Clanford L, MD  predniSONE (DELTASONE) 20 MG tablet Take 4 tablets (80 mg total) by mouth 2 (two) times daily with a meal. Patient taking differently: Take 40 mg by mouth 2 (two) times daily with a meal.  08/14/18  Yes Lockamy, Randi L, NP-C  prochlorperazine (COMPAZINE) 10 MG tablet Take 10 mg by mouth every 6 (six) hours as needed for nausea or vomiting.   Yes [provider]  rosuvastatin (CRESTOR) 20 MG tablet Take 1 tablet (20 mg total) by mouth at bedtime. 07/25/18  Yes Johnson, Clanford L, MD  sulfamethoxazole-trimethoprim (BACTRIM DS,SEPTRA DS) 800-160 MG tablet Take 1 tablet by mouth 3 (three) times a week. Patient taking differently: Take 1 tablet by mouth 3 (three) times a week. Take 1 tablet Monday, Wednesday, Friday. 07/30/18  Yes Lockamy, Randi L, NP-C  SYMBICORT 80-4.5 MCG/ACT inhaler INHALE 2 PUFFS BY MOUTH TWICE DAILY( RINSE MOUTH AFTER USE) Patient taking differently: Inhale 2 puffs into the lungs 2 (two) times daily.  05/06/18  Yes Fayrene Helper, MD  tiZANidine (ZANAFLEX) 4 MG tablet TAKE 1 TABLET(4 MG) BY MOUTH THREE TIMES DAILY Patient taking differently: Take 4 mg by mouth 3  (three) times daily.  08/04/18  Yes Fayrene Helper, MD  celecoxib (CELEBREX) 400 MG capsule Take 400 mg by mouth daily.  08/04/18   [provider]  furosemide (LASIX) 20 MG tablet Take 2 tablets (40 mg total) by mouth every morning. Patient not taking: Reported on 08/21/2018 07/28/18   Irwin Brakeman L, MD  potassium chloride SA (K-DUR,KLOR-CON) 20 MEQ tablet Take 1 tablet (20 mEq total) by mouth every morning. Patient not taking: Reported on 09/10/2018 07/28/18   Murlean Iba, MD    Review of Systems:  Constitutional:  No weight loss, night sweats, Fevers, chills,  Head&Eyes: No headache.  No vision loss.  No eye pain or scotoma ENT:  No Difficulty swallowing,Tooth/dental problems,Sore throat,  No ear ache, post nasal drip,  Cardio-vascular:  No chest pain, Orthopnea, PND, swelling in lower extremities,  dizziness, palpitations  GI:  No vomiting,  loss of appetite, hematochezia, melena, heartburn, indigestion, Resp:  No cough. No coughing up of blood .No wheezing.No chest wall deformity  Skin:  no rash or lesions.  GU:  no dysuria, change in color of urine, no urgency or frequency. No flank pain.  Musculoskeletal:  No joint pain or swelling. No decreased range of motion. No back pain.  Psych:  No change in mood or affect. No depression or anxiety. Neurologic: No headache, no dysesthesia, no focal weakness, no vision loss. No syncope  Physical Exam: Vitals:   08/28/2018 0830 09/06/2018 0930 08/17/2018 0954 08/30/2018 1000  BP: (!) 182/79 (!) 185/76  (!) 191/77  Pulse: (!) 133 (!) 127  (!) 127  Resp: _0 Temp:   98.4 F (36.9 C)   TempSrc:   Oral   SpO2: 92% 91%  92%   General:  A&O x 3, NAD, nontoxic, pleasant/cooperative Head/Eye: No conjunctival hemorrhage, no icterus, [place]/AT, No nystagmus ENT:  No icterus,  No thrush, good dentition, no pharyngeal exudate Neck:  No  masses, no lymphadenpathy, no bruits CV:  RRR, no rub, no gallop, no S3 Lung:   CTAB, good air movement, no wheeze, no rhonchi Abdomen: soft/NT, +BS, nondistended, no peritoneal signs Ext: No cyanosis, No rashes, No petechiae, No lymphangitis, No edema Neuro: CNII-XII intact, strength 4/5 in bilateral upper and lower extremities, no dysmetria  Labs on Admission:  Basic Metabolic Panel: Recent Labs  Lab 08/27/18 0945 08/29/2018 0732  NA 130* 129*  K 5.5* 4.3  CL 91* 91*  CO2 24 21*  GLUCOSE 139* 258*  BUN 47* 33*  CREATININE 1.53* 0.95  CALCIUM 9.5 9.5  MG 1.6* 1.3*   Liver Function Tests: Recent Labs  Lab 08/27/18 0945 09/05/2018 0732  AST 23 35  ALT 31 44  ALKPHOS 45 59  BILITOT 0.9 0.9  PROT 6.4* 6.1*  ALBUMIN 3.5 3.3*   Recent Labs  Lab 09/06/2018 0732  LIPASE 22   No results for input(s): AMMONIA in the last 168 hours. CBC: Recent Labs  Lab 08/27/18 0945 08/30/2018 0732  WBC 11.9* 9.9  NEUTROABS 9.6* 8.0*  HGB 13.1 13.0  HCT 43.3 42.1  MCV 88.9 87.7  PLT 217 158   Coagulation Profile: No results for input(s): INR, PROTIME in the last 168 hours. Cardiac Enzymes: Recent Labs  Lab 08/15/2018 0732  TROPONINI 0.05*   BNP: Invalid input(s): POCBNP CBG: No results for input(s): GLUCAP in the last 168 hours. Urine analysis:    Component Value Date/Time   COLORURINE YELLOW 08/28/2018 0800   APPEARANCEUR HAZY (A) 08/21/2018 0800   LABSPEC 1.022 09/04/2018 0800   PHURINE 5.0 09/06/2018 0800   GLUCOSEU >=500 (A) 09/02/2018 0800   HGBUR NEGATIVE 08/18/2018 0800   BILIRUBINUR NEGATIVE 08/30/2018 0800   BILIRUBINUR negative 05/31/2014 1118   KETONESUR NEGATIVE 09/04/2018 0800   PROTEINUR NEGATIVE 08/24/2018 0800   UROBILINOGEN 0.2 05/31/2014 1118   UROBILINOGEN 1.0 03/23/2010 1030   NITRITE NEGATIVE 08/14/2018 0800   LEUKOCYTESUR TRACE (A) 08/13/2018 0800   Sepsis Labs: _0 (procalcitonin:4,lacticidven:4) ) Recent Results (from the past 240 hour(s))  Stool culture     Status: None (Preliminary result)   Collection Time:  08/27/18 11:13 AM  Result Value Ref Range Status   Salmonella/Shigella Screen Final report  Final   Campylobacter Culture PENDING  Incomplete   E coli, Shiga toxin Assay Negative Negative Final    Comment: (NOTE) Performed At: [facility] [place], [place] [id number] Rush Farmer MD OZ:[id number]   C Difficile Quick Screen w PCR reflex     Status: None   Collection Time: 08/27/18 11:13 AM  Result Value Ref Range Status   C Diff antigen NEGATIVE NEGATIVE Final   C Diff toxin NEGATIVE NEGATIVE Final   C Diff interpretation No C. difficile detected.  Final    Comment: Performed at [facility], [address] [place], [place] [address]  STOOL CULTURE REFLEX - RSASHR     Status: None   Collection Time: 08/27/18 11:13 AM  Result Value Ref Range Status   Stool Culture result 1 (RSASHR) Comment  Final    Comment: (NOTE) No Salmonella or Shigella recovered. Performed At: [facility] [place], [place] [id number] Rush Farmer MD UU:[id number]      Radiological Exams on Admission: Ct Abdomen Pelvis W Contrast  Result Date: 09/06/2018 CLINICAL DATA:  Generalized abdominal pain. History of lung carcinoma EXAM: CT ABDOMEN AND PELVIS WITH CONTRAST TECHNIQUE: Multidetector CT imaging of the abdomen and pelvis was performed using the  standard protocol following bolus administration of intravenous contrast. CONTRAST:  14m ISOVUE-300 IOPAMIDOL (ISOVUE-300) INJECTION 61% COMPARISON:  PET-CT November 13, 2017 FINDINGS: Lower chest: Lung bases are clear. Hepatobiliary: No focal liver lesions are evident. Gallbladder is absent. The common hepatic is dilated at 12 mm. There is tapering distally without biliary duct mass or calculus. No intrahepatic biliary duct dilatation is evident. Pancreas: There is no pancreatic mass or inflammatory focus. Spleen: No splenic lesions are evident. Adrenals/Urinary Tract: Adrenals appear normal bilaterally. Kidneys bilaterally  show no evident mass or hydronephrosis on either side. There is no appreciable renal or ureteral calculus on either side. Urinary bladder is midline with wall thickness within normal limits. Stomach/Bowel: There is evident bowel perforation at the level of the junction of the descending colon and sigmoid colon. Soft tissue air tracks from this area into the left pelvis with surrounding mesenteric stranding. Free air is seen extending throughout the left abdomen into the mid and upper abdominal regions, reaching the medial left hemidiaphragm near the level of the stomach. There is no associated mass by CT in this area. Elsewhere, no other areas of perforation or evident. There is fluid throughout much of the small bowel which may be indicative of a degree of ileus secondary to the bowel perforation more distally. No portal venous air is evident. Vascular/Lymphatic: There is aortic atherosclerosis. There is also right common iliac artery atherosclerosis. Major mesenteric arterial vessels appear patent. No adenopathy is appreciable in the abdomen or pelvis. Reproductive: The uterus is absent.  No pelvic masses evident. Other: There is no periappendiceal region inflammation. A surgical clip is seen in the periappendiceal region. Appendix not seen. No abscess or ascites is evident in the abdomen or pelvis. Patient has had previous hernia repair in the periumbilical region with areas of mesh present. Musculoskeletal: There is multilevel arthropathy in the lumbar spine. No blastic or lytic bone lesions are evident. No intramuscular lesions are evident. IMPRESSION: 1. Evident bowel perforation at the junction of the descending colon and sigmoid colon with extensive free air both loculated in the left pelvis as well as extending into the left upper and mid abdomen with free air abutting the medial left hemidiaphragm. Suspect ruptured diverticulitis. No mass seen. It is conceivable that a neoplastic lesion in the colon  resulted in bowel wall rupture. 2.  Suspect underlying ileus.  No frank bowel obstruction. 3. Status post cholecystectomy. Prominence of the common hepatic duct with tapering of the common bile duct distally noted. No mass or calculus noted in the biliary ductal system. 4.  No evident renal or ureteral calculus.  No hydronephrosis. 5.  Aortoiliac atherosclerosis. 6.  Status post ventral hernia repair with mesh in place. 7.  Extensive multilevel lumbar arthropathy. Critical Value/emergent results were called by telephone at the time of interpretation on 08/25/2018 at 9:38 am to Dr. KFrancine Graven, who verbally acknowledged these results. Electronically Signed   By: WLowella GripIII M.D.   On: 09/02/2018 09:38   Dg Acute Abd 2+v Abd (supine,erect,decub) + 1v Chest  Result Date: 08/15/2018 CLINICAL DATA:  Shortness of breath and diarrhea EXAM: DG ABDOMEN ACUTE W/ 1V CHEST COMPARISON:  Chest radiograph August 08, 2018; PET-CT November 13, 2017 FINDINGS: PA chest: The nodular opacities in the left upper lobe are slightly less well evident than on most recent chest radiograph. There is atelectatic change in the left mid and upper lung zones. There is patchy infiltrate in the left mid lung currently. Right lung appears  clear. Heart size and pulmonary vascularity are normal. Port-A-Cath tip is in the superior vena cava. No pneumothorax. Patient is status post median sternotomy. No adenopathy. No bone lesions. Supine and upright abdomen: There is moderate stool in the colon. There is no bowel dilatation or air-fluid level to suggest bowel obstruction. No free air. There is degenerative change in the lumbar spine. Postoperative changes are noted in the left abdomen. IMPRESSION: Suspect early pneumonia left mid lung. The known nodular lesions in the left upper lobe are less well apparent currently than on recent prior chest radiograph. Right lung is clear. Stable cardiac silhouette. No pneumothorax. No bowel  obstruction or free air. Moderate stool in colon. Postoperative changes noted. Electronically Signed   By: Lowella Grip III M.D.   On: 08/19/2018 07:25    EKG: Independently reviewed.  Sinus rhythm, nonspecific ST changes    Time spent:60 minutes Code Status:   FULL Family Communication:  No Family at bedside Disposition Plan: expect prolonged  hospitalization Consults called: general surgery  DVT Prophylaxis:  SCDs  Orson Eva, DO  [facility] Pager [phone]  If 7PM-7AM, please contact night-coverage [email or web address] Password [facility] 09/12/2018, 10:49 AM

## 2018-08-31 NOTE — ED Notes (Signed)
Dr. Constance Haw at bedside.

## 2018-08-31 NOTE — ED Provider Notes (Signed)
[facility] EMERGENCY DEPARTMENT Provider Note   CSN: [id number] Arrival date & time: 08/19/2018  7619     History   Chief Complaint Chief Complaint  Patient presents with  . Abdominal Pain    HPI [name] is a 73 y.o. female.  HPI Pt was seen at 0710.  Per pt, c/o gradual onset and worsening of persistent generalized abd "pain" for the past 1 week. Pt states she was having multiple episodes of diarrhea 6 days ago: was evaluated by her Onc MD, received IVF and told to take imodium. Pt states after taking the imodium, "now nothing is moving." Pt states her diarrhea stopped 3 days ago and now she is constipated. States she has not had a BM in 3 days, but pt has had diarrhea after arriving to the ED. Describes the abd pain as "cramping."  Described the diarrhea as "watery." Pt also states she has not taken any of her usual medications this morning, including cardizem. Denies N/V, no fevers, no back pain, no rash, no CP, no change in chronic SOB, no black or blood in stools.       Past Medical History:  Diagnosis Date  . Anxiety   . Arthritis   . Bipolar disorder ([place])   . Chronic back pain   . Chronic dyspnea   . COPD (chronic obstructive pulmonary disease) ([place])   . Diverticulosis   . Family history of lung cancer   . Family history of prostate cancer   . Family history of stomach cancer   . 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 Frequent falls   . Gait instability   . GERD (gastroesophageal reflux disease) 2000  . Hyperlipidemia 1995  . Hypertension 1995  . Nicotine addiction   . Obesity     Patient Active Problem List   Diagnosis Date Noted  . Diabetes mellitus, insulin dependent (IDDM), uncontrolled ([place]) 08/28/2018  . Recurrent falls while walking 08/28/2018  . Hyperglycemia 08/08/2018  . Leukocytosis 08/08/2018  . Thrombocytopenia ([place]) 08/08/2018  . SOB (shortness of breath) 07/22/2018  . Chest pain 07/22/2018  .  Chronic pain 07/22/2018  . Chronic constipation 07/22/2018  . Physical deconditioning 07/22/2018  . Family history of stomach cancer   . Family history of lung cancer   . Malignant neoplasm of upper lobe of left lung ([place]) 01/22/2018  . Hospital discharge follow-up 11/09/2017  . Squamous cell lung cancer, left ([place]) 11/01/2017  . COPD (chronic obstructive pulmonary disease) ([place]) 10/29/2017  . Emphysema lung ([place]) 02/26/2017  . Mass of upper lobe of left lung 02/26/2017  . Encounter for chronic pain management 08/26/2016  . Anemia, deficiency 07/27/2016  . H/O nicotine dependence 12/05/2014  . Anxiety and depression 12/31/2013  . Female bladder prolapse 03/10/2013  . Impaired fasting glucose 10/03/2010  . Vitamin D deficiency 09/08/2009  . Back pain with sciatica 04/04/2009  . HTN (hypertension) 06/09/2008  . ABDOMINAL WALL HERNIA 06/09/2008  . HLD (hyperlipidemia) 11/14/2007  . Morbid obesity ([place]) 11/14/2007  . Anxiety 11/14/2007  . GERD (gastroesophageal reflux disease) 11/14/2007    Past Surgical History:  Procedure Laterality Date  . BACK SURGERY  1999  . BRONCHIAL BRUSHINGS Left 10/30/2017   Procedure: BRONCHIAL BRUSHINGS;  Surgeon: [name], MD;  Location: [facility] ENDO SUITE;  Service: Cardiopulmonary;  Laterality: Left;  . BRONCHIAL WASHINGS Left 10/30/2017   Procedure: BRONCHIAL WASHINGS;  Surgeon: [name], MD;  Location: [facility] ENDO  SUITE;  Service: Cardiopulmonary;  Laterality: Left;  . CHOLECYSTECTOMY  2001  . COLONOSCOPY N/A 02/02/2016   Procedure: COLONOSCOPY;  Surgeon: [name], MD;  Location: [facility] ENDO SUITE;  Service: Endoscopy;  Laterality: N/A;  930  . FLEXIBLE BRONCHOSCOPY Bilateral 10/30/2017   Procedure: FLEXIBLE BRONCHOSCOPY;  Surgeon: [name], MD;  Location: [facility] ENDO SUITE;  Service: Cardiopulmonary;  Laterality: Bilateral;  . INCISIONAL HERNIA REPAIR  August 17, 2008  . left inguinal hernia herniorrhapy  1980  . PORTACATH PLACEMENT Left  02/03/2018   Procedure: INSERTION PORT-A-CATH;  Surgeon: [name], MD;  Location: [facility] ORS;  Service: General;  Laterality: Left;  . removal of thmoma  03/27/2010   Dr. Arlyce Dice   . SPINE SURGERY    . TONSILLECTOMY    . TOTAL ABDOMINAL HYSTERECTOMY W/ BILATERAL SALPINGOOPHORECTOMY  1984     OB History   No obstetric history on file.      Home Medications    Prior to Admission medications   Medication Sig Start Date End Date Taking? Authorizing Provider  albuterol (PROAIR HFA) 108 (90 Base) MCG/ACT inhaler INHALE 2 PUFFS BY MOUTH EVERY 6 HOURS IF NEEDED Patient taking differently: Inhale 2 puffs into the lungs every 6 (six) hours as needed for wheezing or shortness of breath.  03/18/18  Yes Fayrene Helper, MD  albuterol (PROVENTIL) (2.5 MG/3ML) 0.083% nebulizer solution USE 1 VIAL IN NEBULIZER EVERY 6 HOURS AS NEEDED. 07/25/18  Yes Fayrene Helper, MD  ALPRAZolam Duanne Moron) 1 MG tablet Take 1 tablet (1 mg total) by mouth 2 (two) times daily as needed for anxiety. 08/08/18 08/08/19 Yes Fayrene Helper, MD  aspirin (ASPIRIN LOW DOSE) 81 MG EC tablet Take 81 mg by mouth daily.     Yes [provider]  blood glucose meter kit and supplies KIT Dispense based on patient and insurance preference. Use up to four times daily as directed. (FOR ICD-9 250.00, 250.01). 08/11/18  Yes Memon, Jolaine Artist, MD  Cholecalciferol (VITAMIN D3) 50 MCG (2000 UT) capsule Take 2,000 Units by mouth daily.   Yes [provider]  diltiazem (CARDIZEM CD) 120 MG 24 hr capsule Take 1 capsule (120 mg total) by mouth every morning. 07/25/18  Yes Johnson, Clanford L, MD  diphenhydrAMINE (BENADRYL) 25 mg capsule Take 25-50 mg by mouth at bedtime as needed for sleep.   Yes [provider]  docusate sodium (COLACE) 100 MG capsule Take 100-200 mg by mouth 2 (two) times daily as needed for mild constipation or moderate constipation.    Yes [provider]  FLUoxetine (PROZAC) 20 MG  capsule Take 1 capsule (20 mg total) by mouth every morning. 07/25/18  Yes Johnson, Clanford L, MD  furosemide (LASIX) 20 MG tablet Take 2 tablets (40 mg total) by mouth every morning. 07/28/18  Yes Johnson, Clanford L, MD  guaiFENesin (MUCINEX) 600 MG 12 hr tablet Take 1 tablet (600 mg total) by mouth 2 (two) times daily. 11/01/17  Yes Kathie Dike, MD  HYDROcodone-acetaminophen (NORCO/VICODIN) 5-325 MG tablet Take one tablet three times daily for back pain 08/08/18 09/07/18 Yes Fayrene Helper, MD  Insulin Glargine (LANTUS) 100 UNIT/ML Solostar Pen Inject 50 Units into the skin at bedtime. 08/11/18  Yes Kathie Dike, MD  Insulin Pen Needle 31G X 8 MM MISC Use as directed 08/11/18  Yes Memon, Jolaine Artist, MD  ipratropium (ATROVENT) 0.02 % nebulizer solution INHALE ONE VIAL VIA NEBULIZER EVERY 6 HOURS AS NEEDED. 07/25/18  Yes Fayrene Helper, MD  lactulose (CHRONULAC) 10 GM/15ML solution Take 15-24m at bedtime every night to assist with moving bowels.  Titrate down if having multiple bowel movements. 03/05/18  Yes KDerek Jack MD  lisinopril (PRINIVIL,ZESTRIL) 10 MG tablet Take 1 tablet (10 mg total) by mouth every morning. 07/25/18  Yes Johnson, Clanford L, MD  metFORMIN (GLUCOPHAGE) 1000 MG tablet Take 1 tablet (1,000 mg total) by mouth 2 (two) times daily with a meal. 08/20/18  Yes SFayrene Helper MD  omeprazole (PRILOSEC) 40 MG capsule Take 1 capsule (40 mg total) by mouth every morning. 07/25/18  Yes Johnson, Clanford L, MD  potassium chloride SA (K-DUR,KLOR-CON) 20 MEQ tablet Take 1 tablet (20 mEq total) by mouth every morning. 07/28/18  Yes Johnson, Clanford L, MD  predniSONE (DELTASONE) 20 MG tablet Take 4 tablets (80 mg total) by mouth 2 (two) times daily with a meal. Patient taking differently: Take 60 mg by mouth 2 (two) times daily with a meal.  08/14/18  Yes Lockamy, Randi L, NP-C  prochlorperazine (COMPAZINE) 10 MG tablet Take 10 mg by mouth every 6 (six) hours as  needed for nausea or vomiting.   Yes [provider]  rosuvastatin (CRESTOR) 20 MG tablet Take 1 tablet (20 mg total) by mouth at bedtime. 07/25/18  Yes Johnson, Clanford L, MD  sulfamethoxazole-trimethoprim (BACTRIM DS,SEPTRA DS) 800-160 MG tablet Take 1 tablet by mouth 3 (three) times a week. 07/30/18  Yes Lockamy, Randi L, NP-C  SYMBICORT 80-4.5 MCG/ACT inhaler INHALE 2 PUFFS BY MOUTH TWICE DAILY( RINSE MOUTH AFTER USE) 05/06/18  Yes SFayrene Helper MD  tiZANidine (ZANAFLEX) 4 MG tablet TAKE 1 TABLET(4 MG) BY MOUTH THREE TIMES DAILY 08/04/18  Yes SFayrene Helper MD  celecoxib (CELEBREX) 400 MG capsule TK ONE C PO ONCE D 08/04/18   [provider]  lidocaine-prilocaine (EMLA) cream Apply a quarter size amount to port site 1 hour prior to chemo. Do not rub in. Cover with plastic wrap. 01/22/18   KDerek Jack MD    Family History Family History  Problem Relation Age of Onset  . Heart disease Mother        enlarged  . Diabetes Mother   . Hypertension Mother   . Cancer Father        throat cancer - smoker  . Hypertension Father   . Coronary artery disease Father   . Diabetes Sister        x3  . Asthma Sister   . Lung cancer Sister 654      d. 845 smoker  . Kidney disease Brother   . Stomach cancer Sister        dx in her 458s-50s . Stroke Brother   . Prostate cancer Brother 736      pat 1/2 brother  . Thyroid cancer Maternal Aunt        dx and d. in her 561s . Bone cancer Maternal Uncle   . Bone cancer Maternal Grandfather   . Cancer Cousin        NOS - had a swollen stomach    Social History Social History   Tobacco Use  . Smoking status: Former Smoker    Packs/day: 1.50    Years: 54.00    Pack years: 81.00    Types: Cigarettes    Last attempt to quit: 08/20/2017    Years since quitting: 1.0  . Smokeless tobacco: Never Used  Substance Use Topics  . Alcohol use: No  Alcohol/week: 0.0 standard drinks  . Drug use: No      Allergies   Bee venom; Penicillins; and Codeine   Review of Systems Review of Systems ROS: Statement: All systems negative except as marked or noted in the HPI; Constitutional: Negative for fever and chills. ; ; Eyes: Negative for eye pain, redness and discharge. ; ; ENMT: Negative for ear pain, hoarseness, nasal congestion, sinus pressure and sore throat. ; ; Cardiovascular: Negative for chest pain, palpitations, diaphoresis, Melanie dyspnea and peripheral edema. ; ; Respiratory: Negative for cough, wheezing and stridor. ; ; Gastrointestinal: +diarrhea, abd pain, constipation. Negative for nausea, vomiting, blood in stool, hematemesis, jaundice and rectal bleeding. . ; ; Genitourinary: Negative for dysuria, flank pain and hematuria. ; ; Musculoskeletal: Negative for back pain and neck pain. Negative for swelling and trauma.; ; Skin: Negative for pruritus, rash, abrasions, blisters, bruising and skin lesion.; ; Neuro: Negative for headache, lightheadedness and neck stiffness. Negative for weakness, altered level of consciousness, altered mental status, extremity weakness, paresthesias, involuntary movement, seizure and syncope.       Physical Exam Updated Vital Signs BP (!) 182/79   Pulse (!) 133   Resp 20   SpO2 92%    Patient Vitals for the past 24 hrs:  BP Temp Temp src Pulse Resp SpO2  08/18/2018 0954 - 98.4 F (36.9 C) Oral - - -  08/24/2018 0930 (!) 185/76 - - (!) 127 20 91 %  09/12/2018 0830 (!) 182/79 - - (!) 133 20 92 %  09/05/2018 0800 (!) 178/78 - - (!) 135 19 90 %  08/16/2018 0730 (!) 173/107 - - - (!) 25 -  08/19/2018 0725 (!) 191/107 - - (!) 155 (!) 30 96 %     Physical Exam 0715: Physical examination:  Nursing notes reviewed; Vital signs and O2 SAT reviewed;  Constitutional: Well developed, Well nourished, Uncomfortable appearing.; Head:  Normocephalic, atraumatic; Eyes: EOMI, PERRL, No scleral icterus; ENMT: Mouth and pharynx normal, Mucous membranes dry; Neck: Supple, Full  range of motion, No lymphadenopathy; Cardiovascular: Tachycardic rate and rhythm, No gallop; Respiratory: Breath sounds clear & equal bilaterally, No wheezes.  Speaking full sentences with ease, Normal respiratory effort/excursion; Chest: Nontender, Movement normal; Abdomen: Soft, +diffuse tenderness to palp. No peritoneal signs. Nondistended, Normal bowel sounds; Genitourinary: No CVA tenderness; Extremities: Peripheral pulses normal, No tenderness, No edema, No calf edema or asymmetry.; Neuro: AA&Ox3, Major CN grossly intact.  Speech clear. No gross focal motor or sensory deficits in extremities.; Skin: Color normal, Warm, Dry.     ED Treatments / Results  Labs (all labs ordered are listed, but only abnormal results are displayed)   EKG EKG Interpretation  Date/Time:  Sunday August 31 2018 06:34:50 EST Ventricular Rate:  146 PR Interval:    QRS Duration: 84 QT Interval:  335 QTC Calculation: 523 R Axis:   74 Text Interpretation:  Sinus tachycardia Multiform ventricular premature complexes Repolarization abnormality, prob rate related Prolonged QT interval Abnormal ekg Confirmed by Ripley Fraise [phone]) on 09/04/2018 6:43:47 AM   Radiology   Procedures Procedures (including critical care time)  Medications Ordered in ED    Initial Impression / Assessment and Plan / ED Course  I have reviewed the triage vital signs and the nursing notes.  Pertinent labs & imaging results that were available during my care of the patient were reviewed by me and considered in my medical decision making (see chart for details).  MDM Reviewed: previous chart, nursing note and vitals  Reviewed previous: labs and ECG Interpretation: labs, ECG, x-ray and CT scan Total time providing critical care: 30-74 minutes. This excludes time spent performing separately reportable procedures and services. Consults: general surgery and admitting MD   CRITICAL CARE Performed by: Francine Graven Total  critical care time: 40 minutes Critical care time was exclusive of separately billable procedures and treating other patients. Critical care was necessary to treat or prevent imminent or life-threatening deterioration. Critical care was time spent personally by me on the following activities: development of treatment plan with patient and/or surrogate as well as nursing, discussions with consultants, evaluation of patient's response to treatment, examination of patient, obtaining history from patient or surrogate, ordering and performing treatments and interventions, ordering and review of laboratory studies, ordering and review of radiographic studies, pulse oximetry and re-evaluation of patient's condition.   Results for orders placed or performed during the hospital encounter of 08/26/2018  CBC with Differential/Platelet  Result Value Ref Range   WBC 9.9 4.0 - 10.5 K/uL   RBC 4.80 3.87 - 5.11 MIL/uL   Hemoglobin 13.0 12.0 - 15.0 g/dL   HCT 42.1 36.0 - 46.0 %   MCV 87.7 80.0 - 100.0 fL   MCH 27.1 26.0 - 34.0 pg   MCHC 30.9 30.0 - 36.0 g/dL   RDW 16.0 (H) 11.5 - 15.5 %   Platelets 158 150 - 400 K/uL   nRBC 0.9 (H) 0.0 - 0.2 %   Neutrophils Relative % 80 %   Neutro Abs 8.0 (H) 1.7 - 7.7 K/uL   Lymphocytes Relative 10 %   Lymphs Abs 1.0 0.7 - 4.0 K/uL   Monocytes Relative 3 %   Monocytes Absolute 0.3 0.1 - 1.0 K/uL   Eosinophils Relative 1 %   Eosinophils Absolute 0.1 0.0 - 0.5 K/uL   Basophils Relative 1 %   Basophils Absolute 0.1 0.0 - 0.1 K/uL   WBC Morphology DOHLE BODIES    Immature Granulocytes 5 %   Abs Immature Granulocytes 0.53 (H) 0.00 - 0.07 K/uL   Reactive, Benign Lymphocytes PRESENT   Comprehensive metabolic panel  Result Value Ref Range   Sodium 129 (L) 135 - 145 mmol/L   Potassium 4.3 3.5 - 5.1 mmol/L   Chloride 91 (L) 98 - 111 mmol/L   CO2 21 (L) 22 - 32 mmol/L   Glucose, Bld 258 (H) 70 - 99 mg/dL   BUN 33 (H) 8 - 23 mg/dL   Creatinine, Ser 0.95 0.44 - 1.00 mg/dL    Calcium 9.5 8.9 - 10.3 mg/dL   Total Protein 6.1 (L) 6.5 - 8.1 g/dL   Albumin 3.3 (L) 3.5 - 5.0 g/dL   AST 35 15 - 41 U/L   ALT 44 0 - 44 U/L   Alkaline Phosphatase 59 38 - 126 U/L   Total Bilirubin 0.9 0.3 - 1.2 mg/dL   GFR calc non Af Amer >60 >60 mL/min   GFR calc Af Amer >60 >60 mL/min   Anion gap 17 (H) 5 - 15  Lipase, blood  Result Value Ref Range   Lipase 22 11 - 51 U/L  Urinalysis, Routine w reflex microscopic  Result Value Ref Range   Color, Urine YELLOW YELLOW   APPearance HAZY (A) CLEAR   Specific Gravity, Urine 1.022 1.005 - 1.030   pH 5.0 5.0 - 8.0   Glucose, UA >=500 (A) NEGATIVE mg/dL   Hgb urine dipstick NEGATIVE NEGATIVE   Bilirubin Urine NEGATIVE NEGATIVE   Ketones, ur NEGATIVE NEGATIVE mg/dL  Protein, ur NEGATIVE NEGATIVE mg/dL   Nitrite NEGATIVE NEGATIVE   Leukocytes, UA TRACE (A) NEGATIVE   RBC / HPF 0-5 0 - 5 RBC/hpf   WBC, UA 6-10 0 - 5 WBC/hpf   Bacteria, UA RARE (A) NONE SEEN   Squamous Epithelial / LPF 0-5 0 - 5   Mucus PRESENT   Troponin I - Once  Result Value Ref Range   Troponin I 0.05 (HH) <0.03 ng/mL  Magnesium  Result Value Ref Range   Magnesium 1.3 (L) 1.7 - 2.4 mg/dL   Dg Acute Abd 2+v Abd (supine,erect,decub) + 1v Chest Result Date: 08/26/2018 CLINICAL DATA:  Shortness of breath and diarrhea EXAM: DG ABDOMEN ACUTE W/ 1V CHEST COMPARISON:  Chest radiograph August 08, 2018; PET-CT November 13, 2017 FINDINGS: PA chest: The nodular opacities in the left upper lobe are slightly less well evident than on most recent chest radiograph. There is atelectatic change in the left mid and upper lung zones. There is patchy infiltrate in the left mid lung currently. Right lung appears clear. Heart size and pulmonary vascularity are normal. Port-A-Cath tip is in the superior vena cava. No pneumothorax. Patient is status post median sternotomy. No adenopathy. No bone lesions. Supine and upright abdomen: There is moderate stool in the colon. There is no bowel  dilatation or air-fluid level to suggest bowel obstruction. No free air. There is degenerative change in the lumbar spine. Postoperative changes are noted in the left abdomen. IMPRESSION: Suspect early pneumonia left mid lung. The known nodular lesions in the left upper lobe are less well apparent currently than on recent prior chest radiograph. Right lung is clear. Stable cardiac silhouette. No pneumothorax. No bowel obstruction or free air. Moderate stool in colon. Postoperative changes noted. Electronically Signed   By: Lowella Grip III M.D.   On: 08/30/2018 07:25    Ct Abdomen Pelvis W Contrast Result Date: 08/28/2018 CLINICAL DATA:  Generalized abdominal pain. History of lung carcinoma EXAM: CT ABDOMEN AND PELVIS WITH CONTRAST TECHNIQUE: Multidetector CT imaging of the abdomen and pelvis was performed using the standard protocol following bolus administration of intravenous contrast. CONTRAST:  161m ISOVUE-300 IOPAMIDOL (ISOVUE-300) INJECTION 61% COMPARISON:  PET-CT November 13, 2017 FINDINGS: Lower chest: Lung bases are clear. Hepatobiliary: No focal liver lesions are evident. Gallbladder is absent. The common hepatic is dilated at 12 mm. There is tapering distally without biliary duct mass or calculus. No intrahepatic biliary duct dilatation is evident. Pancreas: There is no pancreatic mass or inflammatory focus. Spleen: No splenic lesions are evident. Adrenals/Urinary Tract: Adrenals appear normal bilaterally. Kidneys bilaterally show no evident mass or hydronephrosis on either side. There is no appreciable renal or ureteral calculus on either side. Urinary bladder is midline with wall thickness within normal limits. Stomach/Bowel: There is evident bowel perforation at the level of the junction of the descending colon and sigmoid colon. Soft tissue air tracks from this area into the left pelvis with surrounding mesenteric stranding. Free air is seen extending throughout the left abdomen into the mid  and upper abdominal regions, reaching the medial left hemidiaphragm near the level of the stomach. There is no associated mass by CT in this area. Elsewhere, no other areas of perforation or evident. There is fluid throughout much of the small bowel which may be indicative of a degree of ileus secondary to the bowel perforation more distally. No portal venous air is evident. Vascular/Lymphatic: There is aortic atherosclerosis. There is also right common iliac artery atherosclerosis. Major mesenteric  arterial vessels appear patent. No adenopathy is appreciable in the abdomen or pelvis. Reproductive: The uterus is absent.  No pelvic masses evident. Other: There is no periappendiceal region inflammation. A surgical clip is seen in the periappendiceal region. Appendix not seen. No abscess or ascites is evident in the abdomen or pelvis. Patient has had previous hernia repair in the periumbilical region with areas of mesh present. Musculoskeletal: There is multilevel arthropathy in the lumbar spine. No blastic or lytic bone lesions are evident. No intramuscular lesions are evident. IMPRESSION: 1. Evident bowel perforation at the junction of the descending colon and sigmoid colon with extensive free air both loculated in the left pelvis as well as extending into the left upper and mid abdomen with free air abutting the medial left hemidiaphragm. Suspect ruptured diverticulitis. No mass seen. It is conceivable that a neoplastic lesion in the colon resulted in bowel wall rupture. 2.  Suspect underlying ileus.  No frank bowel obstruction. 3. Status post cholecystectomy. Prominence of the common hepatic duct with tapering of the common bile duct distally noted. No mass or calculus noted in the biliary ductal system. 4.  No evident renal or ureteral calculus.  No hydronephrosis. 5.  Aortoiliac atherosclerosis. 6.  Status post ventral hernia repair with mesh in place. 7.  Extensive multilevel lumbar arthropathy. Critical  Value/emergent results were called by telephone at the time of interpretation on 08/29/2018 at 9:38 am to Dr. Francine Graven , who verbally acknowledged these results. Electronically Signed   By: Lowella Grip III M.D.   On: 09/03/2018 09:38        Ref Range & Units 4d ago  C Diff antigen NEGATIVE NEGATIVE   C Diff toxin NEGATIVE NEGATIVE   C Diff interpretation  No C. difficile detected.   Comment: Performed at [facility], [address], [place], [place] [address]  Resulting Agency  [facility] CLIN LAB      Specimen Collected: 08/27/18 11:13 Last Resulted: 08/27/18 17:34       Component 4d ago  Stool Culture result 1 ([facility]) Comment   Comment: (NOTE)  No Salmonella or Shigella recovered.  Performed At: [facility]  [place], [place] [id number]  Rush Farmer MD [id number]   Resulting Agency [facility] LAB      Specimen Collected: 08/27/18 11:13 Last Resulted: 08/30/18 11:36      Salmonella/Shigella Screen  Final report   Campylobacter Culture  PENDING   E coli, Shiga toxin Assay Negative Negative   Comment: (NOTE)  Performed At: [facility]  [place], [place] [id number]  Rush Farmer MD [id number]   Resulting Agency  [facility] CLIN LAB      Specimen Collected: 08/27/18 11:13 Last Resulted: 08/30/18 11:36      0945:  Pt has ambulated to the bathroom and had large amount of watery diarrheal stool after arrival to the ED. No free air or bowel obstruction on AXR. Follow up CT as above; IV abx started. Magnesium repleted IV. Na corrects to 133 for glucose. IVF NS bolus given. HR and BP slowly improving. Remains afebrile. Resps without distress, A&O.  T/C returned from General Surgery Dr. Constance Haw, case discussed, including:  HPI, pertinent PM/SHx, VS/PE, dx testing, ED course and treatment:  Agreeable to come to ED for evaluation, requests to have [facility] MD consult.   1030:  T/C returned from [facility] Dr. Carles Collet, case discussed,  including:  HPI, pertinent PM/SHx, VS/PE, dx testing, ED course and treatment:  Agreeable  to come to ED for consult.   1125:  Pt remains awake/alert, resps without distress, abd diffusely tender, HR 120's, BP improved from arrival. IVF continues.  [facility] MD and General Surgeon have both consulted in the ED: pt will be transferred to [facility] for OR and post-op ICU care.   1245:   Pt awake, talking easily with staff. Remains without hypotension or fever. [facility] at bedside for transport to [facility].      Final Clinical Impressions(s) / ED Diagnoses   Final diagnoses:  None    ED Discharge Orders    None       Francine Graven, DO 09/03/18 1248

## 2018-08-31 NOTE — Consult Note (Signed)
NAME:  [name], MRN:  [id number], DOB:  1945-09-04, LOS: 0 ADMISSION DATE:  08/23/2018, CONSULTATION DATE:  01/26/202019 REFERRING MD:  Dr. Kieth Brightly, CHIEF COMPLAINT:  Perforated Diverticulitis s/p Colectomy/Colostomy     History of present illness   73 year old female presents to ED on 1/19 with reported abdominal pain for 1 week with diarrhea. CT A/P with bowel perforation at the level of the descending colon and sigmoid colon with air tracking up to the left pelvis. Surgery consulted. Taken to OR. Found to have perforated diverticulitis. Now S/P Colectomy/Colostomy. PCCM asked to consult for post-operative care.    Past Medical History  Squamous cell carcinoma of the left lung, HTN, DM, COPD  Significant Hospital Events   1/19 > ED for ABD pain, Taken to OR  Consults:  PCCM Surgery   Procedures:  1/19 > Colectomy/Colostomy   Significant Diagnostic Tests:  ABD Xray 1/19 > Suspect early pneumonia left mid lung. The known nodular lesions in the left upper lobe are less well apparent currently than on recent prior chest radiograph. Right lung is clear. Stable cardiac silhouette. No pneumothorax. No bowel obstruction or free air. Moderate stool in colon. Postoperative changes noted. CT A/P 1/19 > Evident bowel perforation at the junction of the descending colon and sigmoid colon with extensive free air both loculated in the left pelvis as well as extending into the left upper and mid abdomen with free air abutting the medial left hemidiaphragm. Suspect ruptured diverticulitis. No mass seen. It is conceivable that a neoplastic lesion in the colon resulted in bowel wall rupture. Suspect underlying ileus.  No frank bowel obstruction. Status post cholecystectomy. Prominence of the common hepatic duct with tapering of the common bile duct distally noted. No mass or calculus noted in the biliary ductal system. No evident renal or ureteral calculus.  No hydronephrosis. Aortoiliac  atherosclerosis. Status post ventral hernia repair with mesh in place. Extensive multilevel lumbar arthropathy.  Micro Data:  Blood 1/19 >> Urine 1/19 >>   Antimicrobials:  Meropenem 1/19 >>    Objective   Blood pressure (!) 220/91, pulse (!) 128, temperature 98.5 F (36.9 C), temperature source Oral, resp. rate (!) 21, height _0  (1.6 m), weight 99.2 kg, SpO2 95 %.        Intake/Output Summary (Last 24 hours) at 08/16/2018 2033 Last data filed at 08/26/2018 1950 Gross per 24 hour  Intake 4050.2 ml  Output 850 ml  Net 3200.2 ml   Filed Weights   08/23/2018 1350  Weight: 99.2 kg    Examination: General: Obese Adult Female, Lying in bed, no distress  HENT: Dry MM  Lungs: Coarse, Diminished breath sounds, no wheeze  Cardiovascular: Tachy, no MRG Abdomen: Soft, Midline Wound Vac in place, JP drain to right lower abd  Extremities: -edema  Neuro: lethargic, awakens to verbal/physical stimulation, follows commands   GU: foley in place   Resolved Hospital Problem list     Assessment & Plan:   Diverticulitis with perforation s/p Colectomy/Colostomy on 1/19 Plan -Per Surgery  -Maintain NPO -NG to LWS -Wound Vac in Place  -Follow Culture Data, Currently on Meropenem   Acute Hypoxic Respiratory Failure in setting of post-operative atelectasis  COPD H/O Stage III Squamous cell lung cancer of left lung  Plan  -Titrate Supplemental Oxygen to Maintain Saturation >92 -Scheduled Nebs, Pulmicort/Dulera  -Pulmonary Hygiene -Obtain ABG  -Oncology Followed by Delton Coombes > Was on Consolidation Therapy with Durvalumb every 2 weeks for 12 months, however  held at this time due to pneumonitis noted on CT 07/2018, Treated with high dose steroids   Elevated Troponin secondary to demand ischemia  Sinus Tachycardia (EKG with no ischemic changes)  Hypertensive Urgency  Diastolic HF (EF 30-16)  H/O HTN, HLD Plan -Cardiac Monitoring  -Trend Troponin  -Takes Cardizem at home -On  arrival to ED SBP 180, given Hydralazine, Fentanyl, Labetalol on arrival to unit SBP 100, will aim to keep SBP 130-150 -Hydralazine PRN for SBP >160  -Crestor held due to strict NPO  Anion Gap Metabolic Acidosis, Lactic Acidosis LA: 6.4 >>  Hypomagnesemia  Hyponatremia  Plan  -Trend BMP -Replace electrolytes as indicated -NS @ 71m/hr  -Given 500 ml bolus now, Trend LA > Repeat at 0100   GERD Plan -Strict NPO -PPI   DM  Plan  -Trend Glucose  -SSI -Hold Metformin, Lantus  -Hemoglobin A1C pending   Chronic Thrombocytopenia Anemia in post-operative setting  Plan -Trend CBC -Transfuse for hemoglobin <7  -Collect Type and Screen   Anxiety/Depression Acute Pain  Plan  -Morphine PRN  Best practice:  Diet: NPO DVT prophylaxis: SCD, Heparin SQ GI prophylaxis: PPI Glucose control: SSI Mobility: Bedrest  Code Status: FC Family Communication: Family updated at bedside  Disposition: Continue ICU Care   Labs   CBC: Recent Labs  Lab 08/27/18 0945 08/24/2018 0732  WBC 11.9* 9.9  NEUTROABS 9.6* 8.0*  HGB 13.1 13.0  HCT 43.3 42.1  MCV 88.9 87.7  PLT 217 1010   Basic Metabolic Panel: Recent Labs  Lab 08/27/18 0945 08/21/2018 0732  NA 130* 129*  K 5.5* 4.3  CL 91* 91*  CO2 24 21*  GLUCOSE 139* 258*  BUN 47* 33*  CREATININE 1.53* 0.95  CALCIUM 9.5 9.5  MG 1.6* 1.3*   GFR: Estimated Creatinine Clearance: 61.8 mL/min (by C-G formula based on SCr of 0.95 mg/dL). Recent Labs  Lab 08/27/18 0945 08/21/2018 0732  WBC 11.9* 9.9    Liver Function Tests: Recent Labs  Lab 08/27/18 0945 09/05/2018 0732  AST 23 35  ALT 31 44  ALKPHOS 45 59  BILITOT 0.9 0.9  PROT 6.4* 6.1*  ALBUMIN 3.5 3.3*   Recent Labs  Lab 08/17/2018 0732  LIPASE 22   No results for input(s): AMMONIA in the last 168 hours.  ABG    Component Value Date/Time   PHART 7.401 (H) 03/28/2010 0412   PCO2ART 50.1 (H) 03/28/2010 0412   PO2ART 69.0 (L) 03/28/2010 0412   HCO3 31.1 (H)  03/28/2010 0412   TCO2 33 03/28/2010 0412   O2SAT 93.0 03/28/2010 0412     Coagulation Profile: No results for input(s): INR, PROTIME in the last 168 hours.  Cardiac Enzymes: Recent Labs  Lab 08/19/2018 0732  TROPONINI 0.05*    HbA1C: Hgb A1c MFr Bld  Date/Time Value Ref Range Status  08/08/2018 12:47 PM 9.4 (H) 4.8 - 5.6 % Final    Comment:    (NOTE) Pre diabetes:          5.7%-6.4% Diabetes:              >6.4% Glycemic control for   <7.0% adults with diabetes   08/15/2017 09:02 AM 6.1 (H) <5.7 % of total Hgb Final    Comment:    For someone without known diabetes, a hemoglobin  A1c value between 5.7% and 6.4% is consistent with prediabetes and should be confirmed with a  follow-up test. . For someone with known diabetes, a value <7% indicates that  their diabetes is well controlled. A1c targets should be individualized based on duration of diabetes, age, comorbid conditions, and other considerations. . This assay result is consistent with an increased risk of diabetes. . Currently, no consensus exists regarding use of hemoglobin A1c for diagnosis of diabetes for children. .     CBG: Recent Labs  Lab 09/01/2018 1251 08/21/2018 2026  GLUCAP 188* 192*    Review of Systems:   Review of Systems  Constitutional: Negative for chills and fever.  Respiratory: Negative for cough, hemoptysis, sputum production and shortness of breath.   Gastrointestinal: Positive for abdominal pain. Negative for nausea and vomiting.    Past Medical History  She,  has a past medical history of Anxiety, Arthritis, Bipolar disorder ([place]), Chronic back pain, Chronic dyspnea, COPD (chronic obstructive pulmonary disease) ([place]), Diverticulosis, Family history of lung cancer, Family history of prostate cancer, Family history of stomach cancer, Fracture of left ankle (Jan 06, 2010), Frequent falls, Gait instability, GERD (gastroesophageal reflux disease) (2000), Hyperlipidemia (1995),  Hypertension (1995), Nicotine addiction, and Obesity.   Surgical History    Past Surgical History:  Procedure Laterality Date  . BACK SURGERY  1999  . BRONCHIAL BRUSHINGS Left 10/30/2017   Procedure: BRONCHIAL BRUSHINGS;  Surgeon: [name], MD;  Location: [facility];  Service: Cardiopulmonary;  Laterality: Left;  . BRONCHIAL WASHINGS Left 10/30/2017   Procedure: BRONCHIAL WASHINGS;  Surgeon: [name], MD;  Location: [facility];  Service: Cardiopulmonary;  Laterality: Left;  . CHOLECYSTECTOMY  2001  . COLONOSCOPY N/A 02/02/2016   Procedure: COLONOSCOPY;  Surgeon: [name], MD;  Location: [facility];  Service: Endoscopy;  Laterality: N/A;  930  . FLEXIBLE BRONCHOSCOPY Bilateral 10/30/2017   Procedure: FLEXIBLE BRONCHOSCOPY;  Surgeon: [name], MD;  Location: [facility];  Service: Cardiopulmonary;  Laterality: Bilateral;  . INCISIONAL HERNIA REPAIR  August 17, 2008  . left inguinal hernia herniorrhapy  1980  . PORTACATH PLACEMENT Left 02/03/2018   Procedure: INSERTION PORT-A-CATH;  Surgeon: [name], MD;  Location: [facility];  Service: General;  Laterality: Left;  . removal of thmoma  03/27/2010   Dr. Arlyce Dice   . SPINE SURGERY    . TONSILLECTOMY    . TOTAL ABDOMINAL HYSTERECTOMY W/ BILATERAL SALPINGOOPHORECTOMY  1984     Social History   reports that she quit smoking about a year ago. Her smoking use included cigarettes. She has a 81.00 pack-year smoking history. She has never used smokeless tobacco. She reports that she does not drink alcohol or use drugs.   Family History   Her family history includes Asthma in her sister; Bone cancer in her maternal grandfather and maternal uncle; Cancer in her cousin and father; Coronary artery disease in her father; Diabetes in her mother and sister; Heart disease in her mother; Hypertension in her father and mother; Kidney disease in her brother; Lung cancer (age of onset: 73) in her sister; Prostate cancer (age of  onset: 6) in her brother; Stomach cancer in her sister; Stroke in her brother; Thyroid cancer in her maternal aunt.   Allergies Allergies  Allergen Reactions  . Bee Venom Anaphylaxis  . Penicillins Itching    Blisters in mouth after dental work Has patient had a PCN reaction causing immediate rash, facial/tongue/throat swelling, SOB or lightheadedness with hypotension: no Has patient had a PCN reaction causing severe rash involving mucus membranes or skin necrosis: No Has patient had a PCN reaction that required hospitalization No Has patient had a  PCN reaction occurring within the last 10 years: Yes If all of the above answers are "NO", then may proceed with Cephalosporin use.   . Codeine   . Motrin [Ibuprofen]     "messes" with her head.     Home Medications  Prior to Admission medications   Medication Sig Start Date End Date Taking? Authorizing Provider  albuterol (PROAIR HFA) 108 (90 Base) MCG/ACT inhaler INHALE 2 PUFFS BY MOUTH EVERY 6 HOURS IF NEEDED Patient taking differently: Inhale 2 puffs into the lungs every 6 (six) hours as needed for wheezing or shortness of breath.  03/18/18  Yes Fayrene Helper, MD  albuterol (PROVENTIL) (2.5 MG/3ML) 0.083% nebulizer solution USE 1 VIAL IN NEBULIZER EVERY 6 HOURS AS NEEDED. 07/25/18  Yes Fayrene Helper, MD  ALPRAZolam Duanne Moron) 1 MG tablet Take 1 tablet (1 mg total) by mouth 2 (two) times daily as needed for anxiety. 08/08/18 08/08/19 Yes Fayrene Helper, MD  aspirin (ASPIRIN LOW DOSE) 81 MG EC tablet Take 81 mg by mouth daily.     Yes [provider]  Cholecalciferol (VITAMIN D3) 50 MCG (2000 UT) capsule Take 2,000 Units by mouth daily.   Yes [provider]  diltiazem (CARDIZEM CD) 120 MG 24 hr capsule Take 1 capsule (120 mg total) by mouth every morning. 07/25/18  Yes Johnson, Clanford L, MD  diphenhydrAMINE (BENADRYL) 25 mg capsule Take 25-50 mg by mouth at bedtime as needed for sleep.   Yes [provider]  docusate sodium (COLACE) 100 MG capsule Take 100-200 mg by mouth 2 (two) times daily as needed for mild constipation or moderate constipation.    Yes [provider]  FLUoxetine (PROZAC) 20 MG capsule Take 1 capsule (20 mg total) by mouth every morning. 07/25/18  Yes Johnson, Clanford L, MD  guaiFENesin (MUCINEX) 600 MG 12 hr tablet Take 1 tablet (600 mg total) by mouth 2 (two) times daily. 11/01/17  Yes Kathie Dike, MD  HYDROcodone-acetaminophen (NORCO/VICODIN) 5-325 MG tablet Take one tablet three times daily for back pain Patient taking differently: Take 1 tablet by mouth every 8 (eight) hours as needed. Take one tablet three times daily for back pain 08/08/18 09/07/18 Yes Fayrene Helper, MD  Insulin Glargine (LANTUS) 100 UNIT/ML Solostar Pen Inject 50 Units into the skin at bedtime. 08/11/18  Yes Memon, Jolaine Artist, MD  ipratropium (ATROVENT) 0.02 % nebulizer solution INHALE ONE VIAL VIA NEBULIZER EVERY 6 HOURS AS NEEDED. Patient taking differently: Take 0.5 mg by nebulization every 6 (six) hours as needed for wheezing or shortness of breath.  07/25/18  Yes Fayrene Helper, MD  lactulose (CHRONULAC) 10 GM/15ML solution Take 15-35m at bedtime every night to assist with moving bowels.  Titrate down if having multiple bowel movements. Patient taking differently: Take 10 g by mouth 2 (two) times daily as needed for mild constipation. Take 15-352mat bedtime every night to assist with moving bowels.  Titrate down if having multiple bowel movements. 03/05/18  Yes KaDerek JackMD  lidocaine-prilocaine (EMLA) cream Apply a quarter size amount to port site 1 hour prior to chemo. Do not rub in. Cover with plastic wrap. 01/22/18  Yes KaDerek JackMD  lisinopril (PRINIVIL,ZESTRIL) 10 MG tablet Take 1 tablet (10 mg total) by mouth every morning. 07/25/18  Yes Johnson, Clanford L, MD  loperamide (IMODIUM) 2 MG capsule Take 2 mg by mouth as needed for diarrhea or  loose stools.   Yes [provider]  metFORMIN (GLUCOPHAGE) 1000  MG tablet Take 1 tablet (1,000 mg total) by mouth 2 (two) times daily with a meal. 08/20/18  Yes Fayrene Helper, MD  omeprazole (PRILOSEC) 40 MG capsule Take 1 capsule (40 mg total) by mouth every morning. 07/25/18  Yes Johnson, Clanford L, MD  predniSONE (DELTASONE) 20 MG tablet Take 4 tablets (80 mg total) by mouth 2 (two) times daily with a meal. Patient taking differently: Take 40 mg by mouth 2 (two) times daily with a meal.  08/14/18  Yes Lockamy, Randi L, NP-C  prochlorperazine (COMPAZINE) 10 MG tablet Take 10 mg by mouth every 6 (six) hours as needed for nausea or vomiting.   Yes [provider]  rosuvastatin (CRESTOR) 20 MG tablet Take 1 tablet (20 mg total) by mouth at bedtime. 07/25/18  Yes Johnson, Clanford L, MD  sulfamethoxazole-trimethoprim (BACTRIM DS,SEPTRA DS) 800-160 MG tablet Take 1 tablet by mouth 3 (three) times a week. Patient taking differently: Take 1 tablet by mouth 3 (three) times a week. Take 1 tablet Monday, Wednesday, Friday. 07/30/18  Yes Lockamy, Randi L, NP-C  SYMBICORT 80-4.5 MCG/ACT inhaler INHALE 2 PUFFS BY MOUTH TWICE DAILY( RINSE MOUTH AFTER USE) Patient taking differently: Inhale 2 puffs into the lungs 2 (two) times daily.  05/06/18  Yes Fayrene Helper, MD  tiZANidine (ZANAFLEX) 4 MG tablet TAKE 1 TABLET(4 MG) BY MOUTH THREE TIMES DAILY Patient taking differently: Take 4 mg by mouth 3 (three) times daily.  08/04/18  Yes Fayrene Helper, MD  celecoxib (CELEBREX) 400 MG capsule Take 400 mg by mouth daily.  08/04/18   [provider]  furosemide (LASIX) 20 MG tablet Take 2 tablets (40 mg total) by mouth every morning. Patient not taking: Reported on 08/29/2018 07/28/18   Irwin Brakeman L, MD  potassium chloride SA (K-DUR,KLOR-CON) 20 MEQ tablet Take 1 tablet (20 mEq total) by mouth every morning. Patient not taking: Reported on 09/09/2018 07/28/18   Murlean Iba, MD     Critical care time: 24 minutes      Hayden Pedro, AGACNP-BC [facility]  PCCM Pgr: [phone]

## 2018-09-01 ENCOUNTER — Inpatient Hospital Stay (HOSPITAL_COMMUNITY): Payer: PPO

## 2018-09-01 ENCOUNTER — Other Ambulatory Visit (HOSPITAL_COMMUNITY): Payer: Self-pay

## 2018-09-01 ENCOUNTER — Encounter (HOSPITAL_COMMUNITY): Payer: Self-pay | Admitting: General Surgery

## 2018-09-01 DIAGNOSIS — R6521 Severe sepsis with septic shock: Secondary | ICD-10-CM

## 2018-09-01 DIAGNOSIS — A419 Sepsis, unspecified organism: Principal | ICD-10-CM

## 2018-09-01 LAB — CBC
HCT: 41 % (ref 36.0–46.0)
HCT: 36.9 % (ref 36.0–46.0)
HCT: 34.3 % — ABNORMAL LOW (ref 36.0–46.0)
HEMATOCRIT: 25.9 % — AB (ref 36.0–46.0)
HEMOGLOBIN: 12.1 g/dL (ref 12.0–15.0)
HEMOGLOBIN: 8 g/dL — AB (ref 12.0–15.0)
Hemoglobin: 13.3 g/dL (ref 12.0–15.0)
Hemoglobin: 11 g/dL — ABNORMAL LOW (ref 12.0–15.0)
MCH: 27.3 pg (ref 26.0–34.0)
MCH: 27.4 pg (ref 26.0–34.0)
MCH: 27.9 pg (ref 26.0–34.0)
MCH: 28.1 pg (ref 26.0–34.0)
MCHC: 30.9 g/dL (ref 30.0–36.0)
MCHC: 32.1 g/dL (ref 30.0–36.0)
MCHC: 32.4 g/dL (ref 30.0–36.0)
MCHC: 32.8 g/dL (ref 30.0–36.0)
MCV: 85.2 fL (ref 80.0–100.0)
MCV: 85.3 fL (ref 80.0–100.0)
MCV: 86.7 fL (ref 80.0–100.0)
MCV: 88.4 fL (ref 80.0–100.0)
PLATELETS: 32 10*3/uL — AB (ref 150–400)
PLATELETS: 38 10*3/uL — AB (ref 150–400)
Platelets: 47 10*3/uL — ABNORMAL LOW (ref 150–400)
Platelets: 59 10*3/uL — ABNORMAL LOW (ref 150–400)
RBC: 2.93 MIL/uL — ABNORMAL LOW (ref 3.87–5.11)
RBC: 4.02 MIL/uL (ref 3.87–5.11)
RBC: 4.33 MIL/uL (ref 3.87–5.11)
RBC: 4.73 MIL/uL (ref 3.87–5.11)
RDW: 15.9 % — ABNORMAL HIGH (ref 11.5–15.5)
RDW: 16.5 % — ABNORMAL HIGH (ref 11.5–15.5)
RDW: 17 % — ABNORMAL HIGH (ref 11.5–15.5)
RDW: 17.3 % — ABNORMAL HIGH (ref 11.5–15.5)
WBC: 2.9 10*3/uL — ABNORMAL LOW (ref 4.0–10.5)
WBC: 3.5 10*3/uL — ABNORMAL LOW (ref 4.0–10.5)
WBC: 4 10*3/uL (ref 4.0–10.5)
WBC: 4.1 10*3/uL (ref 4.0–10.5)
nRBC: 11.9 % — ABNORMAL HIGH (ref 0.0–0.2)
nRBC: 17.7 % — ABNORMAL HIGH (ref 0.0–0.2)
nRBC: 20.6 % — ABNORMAL HIGH (ref 0.0–0.2)
nRBC: 9 % — ABNORMAL HIGH (ref 0.0–0.2)

## 2018-09-01 LAB — LACTIC ACID, PLASMA
Lactic Acid, Venous: 10.5 mmol/L (ref 0.5–1.9)
Lactic Acid, Venous: 3.8 mmol/L (ref 0.5–1.9)
Lactic Acid, Venous: 3.9 mmol/L (ref 0.5–1.9)
Lactic Acid, Venous: 3.4 mmol/L (ref 0.5–1.9)
Lactic Acid, Venous: 2.4 mmol/L (ref 0.5–1.9)

## 2018-09-01 LAB — BASIC METABOLIC PANEL
Anion gap: 15 (ref 5–15)
Anion gap: 11 (ref 5–15)
Anion gap: 11 (ref 5–15)
BUN: 18 mg/dL (ref 8–23)
BUN: 20 mg/dL (ref 8–23)
BUN: 29 mg/dL — ABNORMAL HIGH (ref 8–23)
CO2: 16 mmol/L — ABNORMAL LOW (ref 22–32)
CO2: 16 mmol/L — ABNORMAL LOW (ref 22–32)
CO2: 13 mmol/L — AB (ref 22–32)
Calcium: 6.2 mg/dL — CL (ref 8.9–10.3)
Calcium: 6.9 mg/dL — ABNORMAL LOW (ref 8.9–10.3)
Calcium: 7.4 mg/dL — ABNORMAL LOW (ref 8.9–10.3)
Chloride: 108 mmol/L (ref 98–111)
Chloride: 107 mmol/L (ref 98–111)
Chloride: 109 mmol/L (ref 98–111)
Creatinine, Ser: 1 mg/dL (ref 0.44–1.00)
Creatinine, Ser: 0.86 mg/dL (ref 0.44–1.00)
Creatinine, Ser: 1.54 mg/dL — ABNORMAL HIGH (ref 0.44–1.00)
GFR calc Af Amer: >60 mL/min (ref >60)
GFR calc Af Amer: 39 mL/min — ABNORMAL LOW (ref >60)
GFR calc non Af Amer: 57 mL/min — ABNORMAL LOW (ref >60)
GFR calc non Af Amer: >60 mL/min (ref >60)
GFR calc non Af Amer: 34 mL/min — ABNORMAL LOW (ref >60)
GLUCOSE: 112 mg/dL — AB (ref 70–99)
Glucose, Bld: 110 mg/dL — ABNORMAL HIGH (ref 70–99)
Glucose, Bld: 102 mg/dL — ABNORMAL HIGH (ref 70–99)
Potassium: 4.8 mmol/L (ref 3.5–5.1)
Potassium: 4.5 mmol/L (ref 3.5–5.1)
Potassium: 4.6 mmol/L (ref 3.5–5.1)
Sodium: 136 mmol/L (ref 135–145)
Sodium: 134 mmol/L — ABNORMAL LOW (ref 135–145)
Sodium: 136 mmol/L (ref 135–145)

## 2018-09-01 LAB — GLUCOSE, CAPILLARY
Glucose-Capillary: 92 mg/dL (ref 70–99)
Glucose-Capillary: 67 mg/dL — ABNORMAL LOW (ref 70–99)
Glucose-Capillary: 120 mg/dL — ABNORMAL HIGH (ref 70–99)
Glucose-Capillary: 89 mg/dL (ref 70–99)
Glucose-Capillary: 79 mg/dL (ref 70–99)
Glucose-Capillary: 88 mg/dL (ref 70–99)
Glucose-Capillary: 91 mg/dL (ref 70–99)

## 2018-09-01 LAB — PROTIME-INR
INR: 1.7
PROTHROMBIN TIME: 19.8 s — AB (ref 11.4–15.2)

## 2018-09-01 LAB — PREPARE RBC (CROSSMATCH)

## 2018-09-01 LAB — TROPONIN I
TROPONIN I: 0.08 ng/mL — AB (ref <0.03)
Troponin I: 0.06 ng/mL (ref <0.03)
Troponin I: 0.07 ng/mL (ref <0.03)
Troponin I: 0.06 ng/mL (ref <0.03)

## 2018-09-01 LAB — HEMOGLOBIN A1C
Hgb A1c MFr Bld: 8.5 % — ABNORMAL HIGH (ref 4.8–5.6)
Mean Plasma Glucose: 197.25 mg/dL

## 2018-09-01 LAB — CORTISOL: Cortisol, Plasma: 25.5 ug/dL

## 2018-09-01 LAB — URINE CULTURE: Culture: 50000 — AB

## 2018-09-01 LAB — MAGNESIUM
Magnesium: 1.8 mg/dL (ref 1.7–2.4)
Magnesium: 2.2 mg/dL (ref 1.7–2.4)

## 2018-09-01 LAB — APTT: aPTT: 28 seconds (ref 24–36)

## 2018-09-01 LAB — PHOSPHORUS: Phosphorus: 5.9 mg/dL — ABNORMAL HIGH (ref 2.5–4.6)

## 2018-09-01 LAB — FIBRINOGEN: Fibrinogen: 342 mg/dL (ref 210–475)

## 2018-09-01 MED ORDER — SODIUM CHLORIDE 0.9% IV SOLUTION
Freq: Once | INTRAVENOUS | Status: AC
  Administered 2018-09-01: 01:00:00 via INTRAVENOUS

## 2018-09-01 MED ORDER — CALCIUM GLUCONATE-NACL 2-0.675 GM/100ML-% IV SOLN
2.0000 g | Freq: Once | INTRAVENOUS | Status: AC
  Administered 2018-09-01: 2000 mg via INTRAVENOUS
  Filled 2018-09-01: qty 100

## 2018-09-01 MED ORDER — ORAL CARE MOUTH RINSE
15.0000 mL | Freq: Two times a day (BID) | OROMUCOSAL | Status: DC
  Administered 2018-09-01 – 2018-09-10 (×19): 15 mL via OROMUCOSAL

## 2018-09-01 MED ORDER — NOREPINEPHRINE-SODIUM CHLORIDE 4-0.9 MG/250ML-% IV SOLN
0.0000 ug/min | INTRAVENOUS | Status: DC
  Administered 2018-09-01: 6 ug/min via INTRAVENOUS
  Administered 2018-09-02: 5 ug/min via INTRAVENOUS
  Filled 2018-09-01 (×2): qty 250

## 2018-09-01 MED ORDER — SODIUM CHLORIDE 0.9 % IV SOLN
INTRAVENOUS | Status: DC | PRN
  Administered 2018-09-01 – 2018-09-08 (×3): via INTRAVENOUS

## 2018-09-01 MED ORDER — SODIUM CHLORIDE 0.9 % IV BOLUS
1000.0000 mL | Freq: Once | INTRAVENOUS | Status: AC
  Administered 2018-09-01: 1000 mL via INTRAVENOUS

## 2018-09-01 MED ORDER — INSULIN ASPART 100 UNIT/ML ~~LOC~~ SOLN: 1.0000 [IU] | SUBCUTANEOUS | Status: DC | PRN

## 2018-09-01 MED ORDER — CHLORHEXIDINE GLUCONATE 0.12 % MT SOLN
15.0000 mL | Freq: Two times a day (BID) | OROMUCOSAL | Status: DC
  Administered 2018-09-01 – 2018-09-10 (×18): 15 mL via OROMUCOSAL
  Filled 2018-09-01 (×15): qty 15

## 2018-09-01 MED ORDER — CALCIUM GLUCONATE-NACL 1-0.675 GM/50ML-% IV SOLN
1.0000 g | Freq: Once | INTRAVENOUS | Status: AC
  Administered 2018-09-01: 1000 mg via INTRAVENOUS
  Filled 2018-09-01: qty 50

## 2018-09-01 MED ORDER — NOREPINEPHRINE-SODIUM CHLORIDE 4-0.9 MG/250ML-% IV SOLN
INTRAVENOUS | Status: AC
  Filled 2018-09-01: qty 250

## 2018-09-01 MED ORDER — METHOCARBAMOL 1000 MG/10ML IJ SOLN
500.0000 mg | Freq: Three times a day (TID) | INTRAVENOUS | Status: DC | PRN
  Administered 2018-09-01: 500 mg via INTRAVENOUS
  Filled 2018-09-01: qty 5
  Filled 2018-09-01: qty 500

## 2018-09-01 MED ORDER — HYDROCORTISONE NA SUCCINATE PF 100 MG IJ SOLR
50.0000 mg | Freq: Three times a day (TID) | INTRAMUSCULAR | Status: DC
  Administered 2018-09-01 – 2018-09-04 (×9): 50 mg via INTRAVENOUS
  Filled 2018-09-01 (×9): qty 2

## 2018-09-01 MED ORDER — SODIUM CHLORIDE 0.9% IV SOLUTION
Freq: Once | INTRAVENOUS | Status: AC
  Administered 2018-09-01: 16:00:00 via INTRAVENOUS

## 2018-09-01 MED ORDER — LACTATED RINGERS IV SOLN
INTRAVENOUS | Status: AC
  Administered 2018-09-01 – 2018-09-06 (×5): via INTRAVENOUS

## 2018-09-01 MED ORDER — DEXTROSE 50 % IV SOLN
12.5000 g | INTRAVENOUS | Status: AC
  Administered 2018-09-01: 12.5 g via INTRAVENOUS
  Filled 2018-09-01: qty 50

## 2018-09-01 NOTE — Consult Note (Signed)
  [facility] TEAM 1 - Stepdown/ICU TEAM  Pt was originally evaluated by [place] at [facility], but it was soon recognized that her most acute issues were surgical in nature. She was transferred to [facility] under the care of Dr. Kieth Brightly.   She is now post-op, and PCCM is providing consultative medical care. As such, [facility] has nothing to add to the exceptional care she is currently receiving. [facility] will therefore sign off, but we remain happy to assist in her care in the future as desired.   Cherene Altes, MD [facility] Office  [phone] Pager - Text Page per [email or web address]  09/01/2018, 8:26 AM

## 2018-09-01 NOTE — Progress Notes (Signed)
[facility] Progress Note  1 Day Post-Op  Subjective: CC: abdominal pain Patient reports generalized abdominal pain, morphine helps with pain but doesn't last long. Patient denies chest pain or difference in breathing. Given 2 units PRBC overnight. Not making much urine per RN.  Son at bedside.   Objective: Vital signs in last 24 hours: Temp:  [97.5 F (36.4 C)-98.5 F (36.9 C)] 97.7 F (36.5 C) (01/20 0745) Pulse Rate:  [105-144] 128 (01/20 0800) Resp:  [19-27] 27 (01/20 0800) BP: (62-220)/(36-122) 105/65 (01/19 2330) SpO2:  [87 %-100 %] 96 % (01/20 0800) Arterial Line BP: (67-144)/(32-69) 144/63 (01/20 0814) Weight:  [99.2 kg-106.5 kg] 106.5 kg (01/19 2110) Last BM Date: 09/01/18  Intake/Output from previous day: 01/19 0701 - 01/20 0700 In: 6297.1 [I.V.:3717; Blood:630; IV Piggyback:1950.1] Out: 1750 [Urine:1000; Emesis/NG output:125; Drains:395; Stool:30; Blood:200] Intake/Output this shift: Total I/O In: 308.2 [I.V.:208.2; IV Piggyback:100] Out: 45 [Drains:45]  PE: Gen:  Alert, appears uncomfortable Card:  Sinus tachycardia, pedal pulses 1+ BL, hands mildly edematous BL Pulm:  Normal effort, diminished BL Abd: Soft, appropriately TTP, non-distended, stoma pink with stool present in ostomy, VAC to midline wound, JP in RLQ with SS drainage   Lab Results:  Recent Labs    09/01/18 0048 09/01/18 0535  WBC 2.9* 3.5*  HGB 8.0* 13.3  HCT 25.9* 41.0  PLT 59* 47*   BMET Recent Labs    09/01/18 0048 09/01/18 0535  NA 136 134*  K 4.8 4.5  CL 108 107  CO2 13* 16*  GLUCOSE 112* 110*  BUN 18 20  CREATININE 1.00 0.86  CALCIUM 6.2* 6.9*   PT/INR Recent Labs    09/01/18 0048  LABPROT 19.8*  INR 1.70   CMP     Component Value Date/Time   NA 134 (L) 09/01/2018 0535   K 4.5 09/01/2018 0535   CL 107 09/01/2018 0535   CO2 16 (L) 09/01/2018 0535   GLUCOSE 110 (H) 09/01/2018 0535   BUN 20 09/01/2018 0535   CREATININE 0.86 09/01/2018 0535    CREATININE 0.78 08/15/2017 0902   CALCIUM 6.9 (L) 09/01/2018 0535   PROT 6.1 (L) 08/21/2018 0732   ALBUMIN 3.3 (L) 08/13/2018 0732   AST 35 08/22/2018 0732   ALT 44 08/15/2018 0732   ALKPHOS 59 08/20/2018 0732   BILITOT 0.9 08/30/2018 0732   GFRNONAA >60 09/01/2018 0535   GFRNONAA 78 08/15/2017 0902   GFRAA >60 09/01/2018 0535   GFRAA 90 08/15/2017 0902   Lipase     Component Value Date/Time   LIPASE 22 09/10/2018 0732       Studies/Results: Ct Abdomen Pelvis W Contrast  Result Date: 09/02/2018 CLINICAL DATA:  Generalized abdominal pain. History of lung carcinoma EXAM: CT ABDOMEN AND PELVIS WITH CONTRAST TECHNIQUE: Multidetector CT imaging of the abdomen and pelvis was performed using the standard protocol following bolus administration of intravenous contrast. CONTRAST:  133mL ISOVUE-300 IOPAMIDOL (ISOVUE-300) INJECTION 61% COMPARISON:  PET-CT November 13, 2017 FINDINGS: Lower chest: Lung bases are clear. Hepatobiliary: No focal liver lesions are evident. Gallbladder is absent. The common hepatic is dilated at 12 mm. There is tapering distally without biliary duct mass or calculus. No intrahepatic biliary duct dilatation is evident. Pancreas: There is no pancreatic mass or inflammatory focus. Spleen: No splenic lesions are evident. Adrenals/Urinary Tract: Adrenals appear normal bilaterally. Kidneys bilaterally show no evident mass or hydronephrosis on either side. There is no appreciable renal or ureteral calculus on either side. Urinary bladder is midline with  wall thickness within normal limits. Stomach/Bowel: There is evident bowel perforation at the level of the junction of the descending colon and sigmoid colon. Soft tissue air tracks from this area into the left pelvis with surrounding mesenteric stranding. Free air is seen extending throughout the left abdomen into the mid and upper abdominal regions, reaching the medial left hemidiaphragm near the level of the stomach. There is no  associated mass by CT in this area. Elsewhere, no other areas of perforation or evident. There is fluid throughout much of the small bowel which may be indicative of a degree of ileus secondary to the bowel perforation more distally. No portal venous air is evident. Vascular/Lymphatic: There is aortic atherosclerosis. There is also right common iliac artery atherosclerosis. Major mesenteric arterial vessels appear patent. No adenopathy is appreciable in the abdomen or pelvis. Reproductive: The uterus is absent.  No pelvic masses evident. Other: There is no periappendiceal region inflammation. A surgical clip is seen in the periappendiceal region. Appendix not seen. No abscess or ascites is evident in the abdomen or pelvis. Patient has had previous hernia repair in the periumbilical region with areas of mesh present. Musculoskeletal: There is multilevel arthropathy in the lumbar spine. No blastic or lytic bone lesions are evident. No intramuscular lesions are evident. IMPRESSION: 1. Evident bowel perforation at the junction of the descending colon and sigmoid colon with extensive free air both loculated in the left pelvis as well as extending into the left upper and mid abdomen with free air abutting the medial left hemidiaphragm. Suspect ruptured diverticulitis. No mass seen. It is conceivable that a neoplastic lesion in the colon resulted in bowel wall rupture. 2.  Suspect underlying ileus.  No frank bowel obstruction. 3. Status post cholecystectomy. Prominence of the common hepatic duct with tapering of the common bile duct distally noted. No mass or calculus noted in the biliary ductal system. 4.  No evident renal or ureteral calculus.  No hydronephrosis. 5.  Aortoiliac atherosclerosis. 6.  Status post ventral hernia repair with mesh in place. 7.  Extensive multilevel lumbar arthropathy. Critical Value/emergent results were called by telephone at the time of interpretation on 08/23/2018 at 9:38 am to Dr. Francine Graven , who verbally acknowledged these results. Electronically Signed   By: Lowella Grip III M.D.   On: 09/07/2018 09:38   Dg Chest Port 1 View  Result Date: 09/01/2018 CLINICAL DATA:  Initial evaluation for acute respiratory failure, pneumonia. Status post abdominal surgery with history of lung cancer. EXAM: PORTABLE CHEST 1 VIEW COMPARISON:  Prior radiograph from 08/13/2018 FINDINGS: Enteric tube courses into the abdomen. Left-sided Port-A-Cath with tip overlying the mid SVC. Stable heart size. Mediastinal silhouette normal. Lungs hypoinflated with elevation left hemidiaphragm. Progressive infiltrate within the mid left lung, compatible with pneumonia. Underlying left upper lobe nodular densities grossly similar to previous. Right lung remains clear. No edema. Probable small left pleural effusion. No pneumothorax. Osseous structures unchanged. IMPRESSION: 1. Progressive mid left lung infiltrate, consistent with pneumonia. 2. Probable small left pleural effusion. Electronically Signed   By: Jeannine Boga M.D.   On: 09/01/2018 06:20   Dg Acute Abd 2+v Abd (supine,erect,decub) + 1v Chest  Result Date: 09/07/2018 CLINICAL DATA:  Shortness of breath and diarrhea EXAM: DG ABDOMEN ACUTE W/ 1V CHEST COMPARISON:  Chest radiograph August 08, 2018; PET-CT November 13, 2017 FINDINGS: PA chest: The nodular opacities in the left upper lobe are slightly less well evident than on most recent chest radiograph. There is atelectatic  change in the left mid and upper lung zones. There is patchy infiltrate in the left mid lung currently. Right lung appears clear. Heart size and pulmonary vascularity are normal. Port-A-Cath tip is in the superior vena cava. No pneumothorax. Patient is status post median sternotomy. No adenopathy. No bone lesions. Supine and upright abdomen: There is moderate stool in the colon. There is no bowel dilatation or air-fluid level to suggest bowel obstruction. No free air. There is  degenerative change in the lumbar spine. Postoperative changes are noted in the left abdomen. IMPRESSION: Suspect early pneumonia left mid lung. The known nodular lesions in the left upper lobe are less well apparent currently than on recent prior chest radiograph. Right lung is clear. Stable cardiac silhouette. No pneumothorax. No bowel obstruction or free air. Moderate stool in colon. Postoperative changes noted. Electronically Signed   By: Lowella Grip III M.D.   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 Kieth Brightly - POD#1 - NGT with small volume bilious output, some stool from colostomy - clamp NGT and trial ice chips with sips of water - add prn IV robaxin for pain control - JP and VAC with SS drainage, continue to monitor - WOC consult for new stoma, will plan to change VAC 1/22  Hypotension and tachycardia - Likely secondary to septic shock, on levo - s/p 2 units PRBC overnight, h/h 13.3/41.0 this AM Elevated troponins - ?demand ischemia, per CCM Thrombocytopenia - plts 47 Lactic acidosis - lactic 3.8 from 10.5  FEN: trial of ice chips/sips with NGT clamped, IVF VTE: SCDs, will discuss timing of lovenox with MD ID: meropenem 1/19>>    LOS: 1 day    Brigid Re , Cvp [facility] 09/01/2018, 8:26 AM Pager: [phone] Consults: [phone] Mon-Fri 7:00 am-4:30 pm Sat-Sun 7:00 am-11:30 am

## 2018-09-01 NOTE — Progress Notes (Signed)
HR 125-130bpm. Margarita Sermons, NP. EKG ordered. Will continue to closely monitor pt.

## 2018-09-01 NOTE — Significant Event (Addendum)
PCCM Called to bedside to reassess  250cc bloody output from drain UOP 100 cc since OR Pt somnolent but arousable Hypotensive - received 1L NS bolus Also received radial A-line by RT  Significant Labs: ABG: 7.325/28/84/14 LA 6.4 Labs: Na 127 K+ 5.1 Bicarb 17 AG 13 BG 239 Mag 1.7 Hgb 9.8 from 12 Hct 32 from 42 Plts 98   Interventions: Was started on Neo gtt for low BP w/ MAP goal >42mmHg HR in 120s on my evaluation  I performed a bedside a CC Ultrasound (Limited study) EF 55-60% on short parasternal and and subcostal views IVC difficult to assess given abdomen and pt's discomfort during exam Evaluated both lung fields and + B lines noted L>R small effusion at Left costophrenic angle Not able to fully assess paracolic recesses  Plan: - Coags pending  - 2 u PRBCs--> 1st unit started 12:30AM - Gave 1L NS bolus at bedside and observed response- HR down to 110-100 and pt weaned off Neo gtt.  -- Will give another 1L bolus - If pt requires Vasopressor, prefer Levophed gtt- she has a port for CVL access - continue to look for signs of active bleeding. Output in [place] does not clot serosanguinous in appearance and nothing noted from wound vac. Surgeon examined pt at bedside and is not concerned for active bleeding at this time.   I explained to son at bedside regarding her clinical condition and the interventions. I also stated that given her clinical condition including metabolic acidosis, hypotension and the interventions including fluid resuscitation that the pt may have worsening respiratory status and risk of re-intubation is high. Pt's son understands and agrees with plan of care.   Signed Dr Seward [facility]

## 2018-09-01 NOTE — Progress Notes (Signed)
Inpatient Diabetes Program Recommendations  AACE/ADA: New Consensus Statement on Inpatient Glycemic Control (2015)  Target Ranges:  Prepandial:   less than 140 mg/dL      Peak postprandial:   less than 180 mg/dL (1-2 hours)      Critically ill patients:  140 - 180 mg/dL   Lab Results  Component Value Date   GLUCAP 89 09/01/2018   HGBA1C 8.5 (H) 09/01/2018    Review of Glycemic Control Results for ANJANAE, WOEHRLE (MRN [id number]) as of 09/01/2018 13:33  Ref. Range 09/01/2018 03:21 09/01/2018 07:44 09/01/2018 08:38 09/01/2018 11:34  Glucose-Capillary Latest Ref Range: 70 - 99 mg/dL 92 67 (L) 120 (H) 89   Diabetes history: Type 2 DM Outpatient Diabetes medications: Lantus 50 units QHS, Metformin 1000 mg BID Current orders for Inpatient glycemic control: Novolog 2-6 units Q4H Solumedrol 50 mg Q8H (just decreased from 100 mg Q8H)  Inpatient Diabetes Program Recommendations:    In the setting of hypoglycemia, may want to consider decreasing the correction under the ICU protocol to 1-3 units Q4H.   Thanks, Bronson Curb, MSN, RNC-OB Diabetes Coordinator [phone] (8a-5p)

## 2018-09-01 NOTE — Progress Notes (Signed)
NAME:  [name], MRN:  [id number], DOB:  September 22, 1945, LOS: 1 ADMISSION DATE:  09/12/2018, CONSULTATION DATE:  01/13/202019 REFERRING MD:  Dr. Kieth Brightly, CHIEF COMPLAINT:  Perforated Diverticulitis s/p Colectomy/Colostomy     History of present illness   73 year old female presents to ED on 1/19 with reported abdominal pain for 1 week with diarrhea. CT A/P with bowel perforation at the level of the descending colon and sigmoid colon with air tracking up to the left pelvis. Surgery consulted. Taken to OR. Found to have perforated diverticulitis. Now S/P Colectomy/Colostomy. PCCM asked to consult for post-operative care.    Past Medical History  Squamous cell carcinoma of the left lung, HTN, DM, COPD  Significant Hospital Events   1/19 > ED for ABD pain, Taken to OR  Consults:  PCCM Surgery   Procedures:  1/19 > Colectomy/Colostomy   Significant Diagnostic Tests:  ABD Xray 1/19 > Suspect early pneumonia left mid lung. The known nodular lesions in the left upper lobe are less well apparent currently than on recent prior chest radiograph. Right lung is clear. Stable cardiac silhouette. No pneumothorax. No bowel obstruction or free air. Moderate stool in colon. Postoperative changes noted. CT A/P 1/19 > Evident bowel perforation at the junction of the descending colon and sigmoid colon with extensive free air both loculated in the left pelvis as well as extending into the left upper and mid abdomen with free air abutting the medial left hemidiaphragm. Suspect ruptured diverticulitis. No mass seen. It is conceivable that a neoplastic lesion in the colon resulted in bowel wall rupture. Suspect underlying ileus. No frank bowel obstruction. Status post cholecystectomy. Prominence of the common hepatic duct with tapering of the common bile duct distally noted. No mass or calculus noted in the biliary ductal system. No evident renal or ureteral calculus.  No hydronephrosis. Aortoiliac atherosclerosis.  Status post ventral hernia repair with mesh in place. Extensive multilevel lumbar arthropathy.  Micro Data:  Blood 1/19 >> Urine 1/19 >>   Antimicrobials:  Meropenem 1/19 >>     Interval/Subjective history: Had some issues overnight. Developed shock, which was refractory tp volume resuscitation with crystalloids and PRBC (did have hemoglobin drop with no clear bleeding with the exception of surgical drain, which has been evaluated to be accetpable by CCS). She was ultimately started on norepinephrine. Currently on 10 mcg with good BP response, however, she is tachycardic to 120s. New TWI in V1-V4. Troponins remain very mildly elevated. Complaining of abdominal pain on the left.   Objective   Blood pressure 105/65, pulse (!) 123, temperature 97.7 F (36.5 C), temperature source Axillary, resp. rate (!) 27, height 5\' 3"  (1.6 m), weight 106.5 kg, SpO2 96 %.        Intake/Output Summary (Last 24 hours) at 09/01/2018 0803 Last data filed at 09/01/2018 0740 Gross per 24 hour  Intake 6297.08 ml  Output 1795 ml  Net 4502.08 ml   Filed Weights   09/03/2018 1350 08/21/2018 2110  Weight: 99.2 kg 106.5 kg    Examination:  General: Obese elderly female in NAD HENT:[place]/AT, PERRL, no appreciable JVD Lungs: Clear, diminished bases, on 3L  with sats 96%.   Cardiovascular: Tachy, regular, no MRG Abdomen: Soft, Midline Wound Vac in place, JP drain to right lower abd with minimal sanguinous drainage.  Extremities: edema  Neuro: lethargic, awakens to verbal/physical stimulation, follows commands   GU: foley in place   Resolved Hospital Problem list   Hypertensive Urgency   Assessment &  Plan:   Diverticulitis with perforation s/p Colectomy/Colostomy on 1/19 -Per Surgery  -NG clamped -Sips and chips today per surgery  -Wound Vac in Place, for change 1/22. - Monitor JP output.  -Follow Culture Data, Currently on Meropenem   Septic shock secondary to peritonitis as above: refractory to volume  resuscitation. - Telemetry - Norepinephrine via port. MAP goal 72mmHg - No further volume    Acute Hypoxic Respiratory Failure in setting of post-operative atelectasis. CXR 1/20 describes possible PNA. -Titrate Supplemental Oxygen to Maintain Saturation >92 - Encourage incentive spirometry - ABX as above. On Mero  - Sputum Cx if able.    COPD without acute exacerbation -Scheduled Nebs, Pulmicort/Dulera   Elevated Troponin secondary to demand ischemia  HFpEF (grade 1 DD on echo) Sinus tachycardia with new TWI V1-V4. No chest pain.  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 Cortisol 25.5 on admission.  - Consider stress dose steroids if refractory shock, however, would be cautious with bowel injury.   Anion Gap Metabolic Acidosis, Lactic Acidosis > improving LA: 6.4 >> 3.8 Hypomagnesemia  Hyponatremia  Hypocalcemia - Trend BMP - Replace electrolytes as indicated - Was given Ca, Mg overnight.  - Lactic clearing, no further checks unless clinically indicated.   GERD -PPI   DM: A1C 5.8 -Trend Glucose  -SSI -Hold Metformin, Lantus   Chronic Thrombocytopenia Anemia in post-operative setting  S/p 2 units PRBC 1/20 -Trend CBC -Transfuse for hemoglobin <7   Anxiety/Depression Acute Pain  -Morphine PRN  Best practice:  Diet: sips per surgery DVT prophylaxis: SCD, Heparin SQ GI prophylaxis: PPI Glucose control: SSI Mobility: Bedrest  Code Status: FC Family Communication: Patient and son updated bedside.  Disposition: ICU  Labs   CBC: Recent Labs  Lab 08/27/18 0945 08/22/2018 0732 09/06/2018 2150 09/01/18 0048 09/01/18 0535  WBC 11.9* 9.9 2.3* 2.9* 3.5*  NEUTROABS 9.6* 8.0*  --   --   --     HGB 13.1 13.0 9.8* 8.0* 13.3  HCT 43.3 42.1 32.2* 25.9* 41.0  MCV 88.9 87.7 89.9 88.4 86.7  PLT 217 158 98* 59* 47*    Basic Metabolic Panel: Recent Labs  Lab 08/27/18 0945 08/15/2018 0732 08/26/2018 2150 09/01/18 0048 09/01/18 0535  NA 130* 129* 127* 136 134*  K 5.5* 4.3 5.1 4.8 4.5  CL 91* 91* 97* 108 107  CO2 24 21* 17* 13* 16*  GLUCOSE 139* 258* 239* 112* 110*  BUN 47* 33* 18 18 20   CREATININE 1.53* 0.95 0.78 1.00 0.86  CALCIUM 9.5 9.5 7.3* 6.2* 6.9*  MG 1.6* 1.3* 1.7 1.8 2.2  PHOS  --   --  5.5* 5.9*  --    GFR: Estimated Creatinine Clearance: 71.1 mL/min (by C-G formula based on SCr of 0.86 mg/dL). Recent Labs  Lab 08/27/2018 0732 08/22/2018 2150 08/24/2018 2151 09/01/18 0040 09/01/18 0048 09/01/18 0535  WBC 9.9 2.3*  --   --  2.9* 3.5*  LATICACIDVEN  --   --  6.4* 10.5*  --  3.8*    Liver Function Tests: Recent Labs  Lab 08/27/18 0945 09/05/2018 0732  AST 23 35  ALT 31 44  ALKPHOS 45 59  BILITOT 0.9 0.9  PROT 6.4* 6.1*  ALBUMIN 3.5 3.3*   Recent Labs  Lab 08/14/2018 0732  LIPASE 22   No results for input(s): AMMONIA in the last 168 hours.  ABG    Component Value Date/Time   PHART 7.325 (L) 08/18/2018 2204   PCO2ART 28.2 (L) 09/02/2018 2204   PO2ART 84.0 08/17/2018 2204   HCO3 14.7 (L) 08/19/2018 2204   TCO2 16 (L) 09/12/2018 2204   ACIDBASEDEF 10.0 (H) 08/13/2018 2204   O2SAT 96.0 09/04/2018 2204     Coagulation Profile: Recent Labs  Lab 09/01/18 0048  INR 1.70    Cardiac Enzymes: Recent Labs  Lab 08/15/2018 0732 08/30/2018 2152 09/01/18 0048  TROPONINI 0.05* 0.05* 0.06*    HbA1C: Hgb A1c MFr Bld  Date/Time Value Ref Range Status  09/01/2018 05:35 AM 8.5 (H) 4.8 - 5.6 % Final    Comment:    (NOTE) Pre diabetes:          5.7%-6.4% Diabetes:              >6.4% Glycemic control for   <7.0% adults with diabetes   08/08/2018 12:47 PM 9.4 (H) 4.8 - 5.6 % Final    Comment:    (NOTE) Pre diabetes:          5.7%-6.4% Diabetes:               >6.4% Glycemic control for   <7.0% adults with diabetes     CBG: Recent Labs  Lab 08/24/2018 2026 08/19/2018 2124 08/17/2018 2331 09/01/18 0321 09/01/18 0744  GLUCAP 192* 217* 144* 92 67*      Critical care time: 45 mins     Georgann Housekeeper, AGACNP-BC [place] Pager [phone] or [phone]  09/01/2018 8:47 AM

## 2018-09-01 NOTE — Progress Notes (Signed)
RT NOTE: RT holding patient's breathing treatments due to HR in 130's. RN aware. RT will attempt at next scheduled time.

## 2018-09-01 NOTE — Progress Notes (Signed)
PCCM Interval Note  On bedside assessment patient with progressive tachycardia and hypotension. Lactic Acid 6.4. Given 1L NS. A.Line Placed. Started on NEO. Hemoglobin drop from 13.0 to 9.8. One Unit RBC ordered. Surgery called as increased output from [place] drain >250 over 2 hours, will come to bedside and evaluate.   CC Time: 36 minutes  Hayden Pedro, AGACNP-BC [facility]  PCCM Pgr: [phone]

## 2018-09-01 NOTE — Consult Note (Addendum)
[place] Nurse wound consult note Pt had surgery on 1/19; midline Vac dressing was applied at that time to full thickness wound.  Current dressing is intact with good seal, mod amt blood-tinged drainage in the cannister, cont suction on at 137mm.  Extra supplies ordered to the bedside and [place] team will plan to perform the first post-op dressing on Wed, as requested by the surgical team.  [place] Nurse ostomy consult note Stoma type/location: Stoma is red and viable when visualized through the pouch, which is intact with good seal.  Bedside nurse states it was changed last night when it was leaking.  Pt will probably need flexible pouch and a barrier ring applied during next pouch change.  Supplies ordered to the bedside for staff nurses use.  Output: Mod amt brown liquid stool in the pouch Ostomy pouching: 2pc.  Education provided: [place] team will perform a teaching session on Wed when Vac dressing change is performed. Pt is critically ill in ICU at this time. Enrolled patient in [employer] program: No Julien Girt MSN, [place], [place], [place], [place]

## 2018-09-02 ENCOUNTER — Inpatient Hospital Stay (HOSPITAL_COMMUNITY): Payer: PPO

## 2018-09-02 ENCOUNTER — Other Ambulatory Visit: Payer: Self-pay

## 2018-09-02 DIAGNOSIS — D693 Immune thrombocytopenic purpura: Secondary | ICD-10-CM

## 2018-09-02 DIAGNOSIS — I248 Other forms of acute ischemic heart disease: Secondary | ICD-10-CM

## 2018-09-02 DIAGNOSIS — I361 Nonrheumatic tricuspid (valve) insufficiency: Secondary | ICD-10-CM

## 2018-09-02 DIAGNOSIS — Z9221 Personal history of antineoplastic chemotherapy: Secondary | ICD-10-CM

## 2018-09-02 DIAGNOSIS — R579 Shock, unspecified: Secondary | ICD-10-CM

## 2018-09-02 DIAGNOSIS — D696 Thrombocytopenia, unspecified: Secondary | ICD-10-CM

## 2018-09-02 DIAGNOSIS — Z87891 Personal history of nicotine dependence: Secondary | ICD-10-CM

## 2018-09-02 LAB — CBC
HCT: 33.2 % — ABNORMAL LOW (ref 36.0–46.0)
HEMOGLOBIN: 10.7 g/dL — AB (ref 12.0–15.0)
MCH: 27.4 pg (ref 26.0–34.0)
MCHC: 32.2 g/dL (ref 30.0–36.0)
MCV: 85.1 fL (ref 80.0–100.0)
Platelets: 25 10*3/uL — CL (ref 150–400)
RBC: 3.9 MIL/uL (ref 3.87–5.11)
RDW: 17.3 % — ABNORMAL HIGH (ref 11.5–15.5)
WBC: 6.3 10*3/uL (ref 4.0–10.5)
nRBC: 9.6 % — ABNORMAL HIGH (ref 0.0–0.2)

## 2018-09-02 LAB — PREPARE PLATELET PHERESIS: Unit division: 0

## 2018-09-02 LAB — DIC (DISSEMINATED INTRAVASCULAR COAGULATION)PANEL
D-Dimer, Quant: 6.97 ug/mL-FEU — ABNORMAL HIGH (ref 0.00–0.50)
Fibrinogen: >800 mg/dL — ABNORMAL HIGH (ref 210–475)
INR: 1.57
Prothrombin Time: 18.6 seconds — ABNORMAL HIGH (ref 11.4–15.2)
Smear Review: NONE SEEN
aPTT: 31 seconds (ref 24–36)

## 2018-09-02 LAB — GLUCOSE, CAPILLARY
GLUCOSE-CAPILLARY: 106 mg/dL — AB (ref 70–99)
GLUCOSE-CAPILLARY: 95 mg/dL (ref 70–99)
Glucose-Capillary: 98 mg/dL (ref 70–99)
Glucose-Capillary: 112 mg/dL — ABNORMAL HIGH (ref 70–99)
Glucose-Capillary: 93 mg/dL (ref 70–99)
Glucose-Capillary: 120 mg/dL — ABNORMAL HIGH (ref 70–99)

## 2018-09-02 LAB — TYPE AND SCREEN
ABO/RH(D): O POS
Antibody Screen: NEGATIVE
Unit division: 0
Unit division: 0

## 2018-09-02 LAB — BASIC METABOLIC PANEL
Anion gap: 10 (ref 5–15)
BUN: 32 mg/dL — AB (ref 8–23)
CO2: 18 mmol/L — ABNORMAL LOW (ref 22–32)
Calcium: 7.4 mg/dL — ABNORMAL LOW (ref 8.9–10.3)
Chloride: 109 mmol/L (ref 98–111)
Creatinine, Ser: 1.29 mg/dL — ABNORMAL HIGH (ref 0.44–1.00)
GFR calc Af Amer: 49 mL/min — ABNORMAL LOW (ref >60)
GFR, EST NON AFRICAN AMERICAN: 42 mL/min — AB (ref >60)
Glucose, Bld: 113 mg/dL — ABNORMAL HIGH (ref 70–99)
Potassium: 4.5 mmol/L (ref 3.5–5.1)
Sodium: 137 mmol/L (ref 135–145)

## 2018-09-02 LAB — BPAM RBC
Blood Product Expiration Date: 202001252359
Blood Product Expiration Date: 202002222359
ISSUE DATE / TIME: 202001200023
ISSUE DATE / TIME: 202001200207
UNIT TYPE AND RH: 5100
Unit Type and Rh: 5100

## 2018-09-02 LAB — ECHOCARDIOGRAM COMPLETE
Height: 63 in
Weight: 3756.64 oz

## 2018-09-02 LAB — TROPONIN I
Troponin I: 0.06 ng/mL (ref <0.03)
Troponin I: 0.05 ng/mL (ref <0.03)

## 2018-09-02 LAB — PHOSPHORUS: Phosphorus: 4.8 mg/dL — ABNORMAL HIGH (ref 2.5–4.6)

## 2018-09-02 LAB — BPAM PLATELET PHERESIS
Blood Product Expiration Date: 202001202359
ISSUE DATE / TIME: 202001201519
Unit Type and Rh: 6200

## 2018-09-02 LAB — MAGNESIUM: Magnesium: 2.1 mg/dL (ref 1.7–2.4)

## 2018-09-02 LAB — LACTATE DEHYDROGENASE: LDH: 1121 U/L — ABNORMAL HIGH (ref 98–192)

## 2018-09-02 LAB — DIC (DISSEMINATED INTRAVASCULAR COAGULATION) PANEL: PLATELETS: 19 10*3/uL — AB (ref 150–400)

## 2018-09-02 LAB — SAVE SMEAR(SSMR), FOR PROVIDER SLIDE REVIEW

## 2018-09-02 MED ORDER — SODIUM CHLORIDE 0.9% IV SOLUTION
Freq: Once | INTRAVENOUS | Status: AC
  Administered 2018-09-02: 20:00:00 via INTRAVENOUS

## 2018-09-02 MED ORDER — ALBUMIN HUMAN 5 % IV SOLN
12.5000 g | Freq: Once | INTRAVENOUS | Status: DC
  Filled 2018-09-02: qty 250

## 2018-09-02 NOTE — Consult Note (Signed)
[place] CONSULT NOTE  Patient Care Team: Fayrene Helper, MD as PCP - General  CHIEF COMPLAINTS/PURPOSE OF CONSULTATION:  Severe thrombocytopenia  HISTORY OF PRESENTING ILLNESS:  [name] 73 y.o. female who was admitted to the hospital with perforated viscus which was identified on a CT of the abdomen and pelvis on 09/06/2018 where there was evidence of bowel perforation at the junction of the descending colon and sigmoid colon.  She underwent colectomy/colostomy on 1/19 and she also had some evidence of pneumonia on x-ray. On 08/27/2018: Platelet count 217, 08/18/2018: 98; 09/01/2018: 59; 09/02/2018: 25 and subsequently 19 There is no evidence of active bleeding either from the colostomy or from the JP drain.  Patient had a prior history of stage IIIa lung cancer treated by Dr. Delton Coombes at [facility].  She was treated with concurrent chemoradiation and had a partial response and was treated with durvalumab continue 05/21/2018 for pneumonitis and was started on prednisone.  Patient has oxygen by nasal cannula and her family is at her bedside.  I reviewed her records extensively and collaborated the history with the patient.  SUMMARY OF ONCOLOGIC HISTORY:   Squamous cell lung cancer, left ([place])   10/28/2017 Imaging    CT scan of the chest shows left hilar mass measuring 3.4 x 2.9 cm, multiple soft tissue masses in the left upper lobe, largest measuring 1.6 cm  MRI of the brain on 10/31/2017 shows dural based focus of contrast enhancement along the superior left convexity, meningioma favored as it was present on MRI in 2009    10/30/2017 Initial Biopsy    Bronchoscopy and biopsy consistent with squamous cell cancer of the lung    11/08/2017 Family History    2 sisters died of stomach cancer Father died of lung cancer in a smoker Maternal aunt died of thyroid cancer Maternal grandfather had cancer spread to the bone     02/05/2018 - 03/25/2018 Chemotherapy    The patient had palonosetron (ALOXI) injection 0.25 mg, 0.25 mg, Intravenous,  Once, 6 of 6 cycles Administration: 0.25 mg (02/11/2018), 0.25 mg (02/18/2018), 0.25 mg (02/25/2018), 0.25 mg (03/04/2018), 0.25 mg (03/12/2018), 0.25 mg (03/19/2018) CARBOplatin (PARAPLATIN) 220 mg in sodium chloride 0.9 % 250 mL chemo infusion, 220 mg (100 % of original dose 215.8 mg), Intravenous,  Once, 6 of 6 cycles Dose modification:   (original dose 215.8 mg, Cycle 1),   (original dose 215.8 mg, Cycle 2), 215.8 mg (original dose 215.8 mg, Cycle 3),   (original dose 215.8 mg, Cycle 4) Administration: 220 mg (02/11/2018), 220 mg (02/18/2018), 220 mg (02/25/2018), 220 mg (03/04/2018), 220 mg (03/12/2018), 220 mg (03/19/2018) PACLitaxel (TAXOL) 96 mg in sodium chloride 0.9 % 250 mL chemo infusion (</= 59m/m2), 45 mg/m2 = 96 mg, Intravenous,  Once, 6 of 6 cycles Administration: 96 mg (02/11/2018), 96 mg (02/18/2018), 96 mg (02/25/2018), 96 mg (03/04/2018), 96 mg (03/12/2018), 96 mg (03/19/2018)  for chemotherapy treatment.      [place]   01/22/2018 Initial Diagnosis    [place]align[place] of [place] lung [place]    05/06/2018 -  Chemotherapy    The patient had durvalumab (IMFINZI) 1,000 mg in sodium chloride 0.9 % 100 mL chemo infusion, 10.2 mg/kg = 980 mg, Intravenous,  Once, 4 of 6 cycles Administration: 1,000 mg (05/07/2018), 1,000 mg (05/21/2018), 1,000 mg (06/19/2018), 1,000 mg (07/03/2018)  for chemotherapy treatment.  MEDICAL HISTORY:  Past Medical History:  Diagnosis Date  . Anxiety   . Arthritis   . Bipolar disorder ([place])   . Chronic back pain   . Chronic dyspnea   . COPD (chronic obstructive pulmonary disease) ([place])   . Diverticulosis   . Family history of lung cancer   . Family history of prostate cancer   . Family history of stomach cancer   . 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  Frequent falls   . Gait instability   . GERD (gastroesophageal reflux disease) 2000  . Hyperlipidemia 1995  . Hypertension 1995  . Nicotine addiction   . Obesity     SURGICAL HISTORY: Past Surgical History:  Procedure Laterality Date  . BACK SURGERY  1999  . BRONCHIAL BRUSHINGS Left 10/30/2017   Procedure: BRONCHIAL BRUSHINGS;  Surgeon: [name], MD;  [facility];  Service: Cardiopulmonary;  Laterality: Left;  . BRONCHIAL WASHINGS Left 10/30/2017   Procedure: BRONCHIAL WASHINGS;  Surgeon: [name], MD;  [facility];  Service: Cardiopulmonary;  Laterality: Left;  . CHOLECYSTECTOMY  2001  . COLECTOMY WITH COLOSTOMY CREATION/HARTMANN PROCEDURE N/A 08/26/2018   Procedure: EXPLORATORY LAPAROTOMY; COLECTOMY WITH COLOSTOMY CREATION/HARTMANN PROCEDURE; LYSIS OF ADHESION; PLACEMENT OF VACUUM DRESSING, 100 CM2;  Surgeon: Kinsinger, Arta Bruce, MD;  Location: [facility];  Service: General;  Laterality: N/A;  . COLONOSCOPY N/A 02/02/2016   Procedure: COLONOSCOPY;  Surgeon: [name], MD;  [facility];  Service: Endoscopy;  Laterality: N/A;  930  . FLEXIBLE BRONCHOSCOPY Bilateral 10/30/2017   Procedure: FLEXIBLE BRONCHOSCOPY;  Surgeon: [name], MD;  [facility];  Service: Cardiopulmonary;  Laterality: Bilateral;  . INCISIONAL HERNIA REPAIR  August 17, 2008  . left inguinal hernia herniorrhapy  1980  . PORTACATH PLACEMENT Left 02/03/2018   Procedure: INSERTION PORT-A-CATH;  Surgeon: [name], MD;  Location: [facility];  Service: General;  Laterality: Left;  . removal of thmoma  03/27/2010   Dr. Arlyce Dice   . SPINE SURGERY    . TONSILLECTOMY    . TOTAL ABDOMINAL HYSTERECTOMY W/ BILATERAL SALPINGOOPHORECTOMY  1984    SOCIAL HISTORY: Social History   Socioeconomic History  . Marital status: Married    Spouse name: Not on file  . Number of children: Not on file  . Years of education: Not on file  . Highest education level: Not on  file  Occupational History  . 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 Financial resource strain: Not hard at all  . Food insecurity:    Worry: Never true    Inability: Never true  . Transportation needs:    Medical: No    Non-medical: No  Tobacco Use  . Smoking status: Former Smoker    Packs/day: 1.50    Years: 54.00    Pack years: 81.00    Types: Cigarettes    Last attempt to quit: 08/20/2017    Years since quitting: 1.0  . Smokeless tobacco: Never Used  Substance and Sexual Activity  . Alcohol use: No    Alcohol/week: 0.0 standard drinks  . Drug use: No  . Sexual activity: Not Currently  Lifestyle  . Physical activity:    Days per week: 0 days    Minutes per session: 0 min  . Stress: Only a little  Relationships  . Social connections:    Talks  on phone: More than three times a week    Gets together: More than three times a week    Attends religious service: More than 4 times per year    Active member of club or organization: No    Attends meetings of clubs or organizations: Never    Relationship status: Married  . Intimate partner violence:    Fear of current or ex partner: No    Emotionally abused: No    Physically abused: No    Forced sexual activity: No  Other Topics Concern  . Not on file  Social History Narrative   Pt had 2 stillborns     FAMILY HISTORY: Family History  Problem Relation Age of Onset  . Heart disease Mother        enlarged  . Diabetes Mother   . Hypertension Mother   . Cancer Father        throat cancer - smoker  . Hypertension Father   . Coronary artery disease Father   . Diabetes Sister        x3  . Asthma Sister   . Lung cancer Sister 35       d. 79; smoker  . Kidney disease Brother   . Stomach cancer Sister        dx in her 97s-50s  . Stroke Brother   . Prostate cancer Brother 53       pat 1/2 brother  . Thyroid cancer Maternal Aunt        dx and d. in her 87s  . Bone  cancer Maternal Uncle   . Bone cancer Maternal Grandfather   . Cancer Cousin        NOS - had a swollen stomach    ALLERGIES:  is allergic to bee venom; penicillins; codeine; and motrin [ibuprofen].  MEDICATIONS:  Current Facility-Administered Medications  Medication Dose Route Frequency Provider Last Rate Last Dose  . 0.9 %  sodium chloride infusion   Intravenous PRN Corey Harold, NP   Stopped at 09/02/18 1543  . acetaminophen (TYLENOL) tablet 650 mg  650 mg Oral Q6H PRN Kinsinger, Arta Bruce, MD       Or  . acetaminophen (TYLENOL) suppository 650 mg  650 mg Rectal Q6H PRN Kinsinger, Arta Bruce, MD      . albuterol (PROVENTIL) (2.5 MG/3ML) 0.083% nebulizer solution 2.5 mg  2.5 mg Nebulization Q4H PRN Omar Person, NP      . arformoterol (BROVANA) nebulizer solution 15 mcg  15 mcg Nebulization BID Omar Person, NP   15 mcg at 09/02/18 7096  . budesonide (PULMICORT) nebulizer solution 0.5 mg  0.5 mg Nebulization BID Kinsinger, Arta Bruce, MD   0.5 mg at 09/02/18 2836  . chlorhexidine (PERIDEX) 0.12 % solution 15 mL  15 mL Mouth Rinse BID Kinsinger, Arta Bruce, MD   15 mL at 09/02/18 [phone]  . hydrocortisone sodium succinate (SOLU-CORTEF) 100 MG injection 50 mg  50 mg Intravenous Q8H Corey Harold, NP   50 mg at 09/02/18 1546  . insulin aspart (novoLOG) injection 1-3 Units  1-3 Units Subcutaneous Q4H PRN Corey Harold, NP      . ipratropium-albuterol (DUONEB) 0.5-2.5 (3) MG/3ML nebulizer solution 3 mL  3 mL Nebulization BID Kinsinger, Arta Bruce, MD   3 mL at 09/01/18 2121  . lactated ringers infusion   Intravenous Continuous Corey Harold, NP 50 mL/hr at 09/02/18 1600    . MEDLINE mouth rinse  15 mL  Mouth Rinse q12n4p Kinsinger, Arta Bruce, MD   15 mL at 09/02/18 1214  . meropenem (MERREM) 1 g in sodium chloride 0.9 % 100 mL IVPB  1 g Intravenous Q8H Kinsinger, Arta Bruce, MD 200 mL/hr at 09/02/18 1600    . methocarbamol (ROBAXIN) 500 mg in dextrose 5 % 50 mL IVPB  500 mg  Intravenous Q8H PRN Rayburn, Floyce Stakes, PA-C   Stopped at 09/01/18 1638  . morphine 2 MG/ML injection 2 mg  2 mg Intravenous Q2H PRN Kinsinger, Arta Bruce, MD   2 mg at 09/02/18 1518  . norepinephrine (LEVOPHED) 61m in NS 2548mpremix infusion  0-40 mcg/min Intravenous Titrated [place]at[place]fe, KrRise PaganiniMD   Stopped at 09/02/18 [phone]. ondansetron (ZOFRAN) tablet 4 mg  4 mg Oral Q6H PRN Kinsinger, LuArta BruceMD       Or  . ondansetron ([facility]Mary [facility]inridge [facility] Hospitalinjection 4 mg  4 mg Intravenous Q6H PRN Kinsinger, LuArta BruceMD      . pantoprazole (PROTONIX) injection 40 mg  40 mg Intravenous Q24H EuHayden Pedro, NP   40 mg at 09/02/18 [id number]  REVIEW OF SYSTEMS:   Constitutional: Bedbound Eyes: Tearing of eyes Ears, nose, mouth, throat, and face: Oxygen by nasal cannula Respiratory: Shortness of breath Cardiovascular: Denies palpitation, chest discomfort or lower extremity swelling Gastrointestinal: Abdominal pain is improving, patient is n.p.o. Neurological: Generalized weaknesses Behavioral/Psych: Not evaluated  All other systems were reviewed with the patient and are negative.  PHYSICAL EXAMINATION: ECOG PERFORMANCE STATUS: 3 - Symptomatic, >50% confined to bed  Vitals:   09/02/18 1500 09/02/18 1600  BP: 134/73 (!) 103/44  Pulse: (!) 130 (!) 126  Resp: 18 19  Temp:  97.7 F (36.5 C)  SpO2: 92% 96%   Filed Weights   09/06/2018 1350 09/09/2018 2110  Weight: 218 lb 11.1 oz (99.2 kg) 234 lb 12.6 oz (106.5 kg)    GENERAL:alert, no distress and comfortable SKIN: skin color, texture, turgor are normal, no rashes or significant lesions EYES: normal, conjunctiva are pink and non-injected, sclera clear NECK: supple LUNGS: Short shallow breath sounds HEART: Tachycardia ABDOMEN: Wound VAC, JP drain, colostomy Musculoskeletal:no cyanosis of digits and no clubbing  PSYCH: alert  NEURO: no focal motor/sensory deficits, able to move all extremities  LABORATORY DATA:  I have reviewed the data as  listed Lab Results  Component Value Date   WBC 6.3 09/02/2018   HGB 10.7 (L) 09/02/2018   HCT 33.2 (L) 09/02/2018   MCV 85.1 09/02/2018   PLT 19 (LL) 09/02/2018   Lab Results  Component Value Date   NA 137 09/02/2018   K 4.5 09/02/2018   CL 109 09/02/2018   CO2 18 (L) 09/02/2018    RADIOGRAPHIC STUDIES: I have personally reviewed the radiological reports and agreed with the findings in the report.  ASSESSMENT AND PLAN:  1.  Severe thrombocytopenia: Platelet count 19 Patient received blood transfusion as well as plasma yesterday. Peripheral smear will be ordered.  Differential diagnosis: 1. ITP: Unlikely because the patient is on high-dose steroids and the platelets continue to decline. 2. medication induced: Meropenam can cause thrombocytopenia. 3. Bone marrow suppression from sepsis 4.  DIC has been ruled out with fibrinogen level being elevated 5.  HIT antibodies pending 6.  Peripheral smear does not show any evidence of schistocytes which rules out TTP LDH markedly elevated probably due to sepsis.  If there is no TTP then we plan to give the patient platelet transfusion. Immature platelet  fraction will be obtained to see if the etiology is decreased production or increased destruction.  Transfuse platelets if there is any evidence of bleeding.    Harriette Ohara, MD _0 @

## 2018-09-02 NOTE — Progress Notes (Signed)
NAME:  [name], MRN:  [id number], DOB:  Aug 13, 1946, LOS: 2 ADMISSION DATE:  09/01/2018, CONSULTATION DATE:  01/18/202019 REFERRING MD:  Dr. Kieth Brightly, CHIEF COMPLAINT:  Perforated Diverticulitis s/p Colectomy/Colostomy     History of present illness   73 year old female presents to ED on 1/19 with reported abdominal pain for 1 week with diarrhea. CT A/P with bowel perforation at the level of the descending colon and sigmoid colon with air tracking up to the left pelvis. Surgery consulted. Taken to OR. Found to have perforated diverticulitis. Now S/P Colectomy/Colostomy. PCCM asked to consult for post-operative care.    Past Medical History  Squamous cell carcinoma of the left lung, HTN, DM, COPD  Significant Hospital Events   1/19 > ED for ABD pain, Taken to OR  Consults:  PCCM Surgery   Procedures:  1/19 > Colectomy/Colostomy   Significant Diagnostic Tests:  ABD Xray 1/19 > Suspect early pneumonia left mid lung. The known nodular lesions in the left upper lobe are less well apparent currently than on recent prior chest radiograph. Right lung is clear. Stable cardiac silhouette. No pneumothorax. No bowel obstruction or free air. Moderate stool in colon. Postoperative changes noted. CT A/P 1/19 > Evident bowel perforation at the junction of the descending colon and sigmoid colon with extensive free air both loculated in the left pelvis as well as extending into the left upper and mid abdomen with free air abutting the medial left hemidiaphragm. Suspect ruptured diverticulitis. No mass seen. It is conceivable that a neoplastic lesion in the colon resulted in bowel wall rupture. Suspect underlying ileus. No frank bowel obstruction. Status post cholecystectomy. Prominence of the common hepatic duct with tapering of the common bile duct distally noted. No mass or calculus noted in the biliary ductal system. No evident renal or ureteral calculus.  No hydronephrosis. Aortoiliac atherosclerosis.  Status post ventral hernia repair with mesh in place. Extensive multilevel lumbar arthropathy.  Micro Data:  Blood 1/19 >> Urine 1/19 >>   Antimicrobials:  Meropenem 1/19 >>    Interval/Subjective history: Feeling much better today. Off pressors. Tolerating sips/chips PO. Now having some colostomy output.   Objective   Blood pressure (!) 116/55, pulse (!) 128, temperature 97.7 F (36.5 C), temperature source Oral, resp. rate (!) 21, height 5\' 3"  (1.6 m), weight 106.5 kg, SpO2 95 %.        Intake/Output Summary (Last 24 hours) at 09/02/2018 0957 Last data filed at 09/02/2018 0800 Gross per 24 hour  Intake 2239.06 ml  Output 1005 ml  Net 1234.06 ml   Filed Weights   09/02/2018 1350 08/14/2018 2110  Weight: 99.2 kg 106.5 kg    Examination:   General: Obese elderly appearing female in NAD HENT:[place]/AT, PERRL, no appreciable JVD Lungs: Clear bilateral breath sounds Cardiovascular: Tachy, regular, no MRG Abdomen: Soft, midline wound vac in place, JP drain to right lower abd with minimal serosanguinous drainage. Colostomy appears healthy with minimal feculent output. Extremities: trace edema.  Neuro: wide awake this morning. Much improved. Oriented, non-focal.  GU: foley in place   Resolved Hospital Problem list   Hypertensive Urgency, Septic shock, post op anemia s/p 2 units PRBC  Assessment & Plan:   Diverticulitis with perforation and associated peritonitis s/p Colectomy/Colostomy on 1/19 - Per Surgery  - NG pulled out inadvertently by patient. Was clamped. Leave out.  - Continue Sips and chips today per surgery  - Wound Vac in Place, for change 1/22. - Monitor JP output.  -  Follow Culture Data, Currently on Meropenem   Acute Hypoxic Respiratory Failure in setting of post-operative atelectasis. CXR 1/20 describes possible PNA. Improving - Titrate Supplemental Oxygen to Maintain Saturation >92 - Encourage incentive spirometry - ABX as above. On Mero  - Sputum Cx if able.      COPD without acute exacerbation -Scheduled Nebs, Pulmicort/Dulera   Elevated Troponin secondary to demand ischemia  HFpEF (grade 1 DD on echo) Sinus tachycardia with new TWI V1-V4. Troponin very mildly and briefly bumped, but now headed back down.  - EKG in AM - Takes Cardizem at home, continues to be tachycardic but BP prevents me from restarting this.  - Echo pending.   H/O Stage III Squamous cell lung cancer of left lung: Oncology Followed by Delton Coombes > Was on consolidation therapy with Durvalumb every 2 weeks for 12 months, however held at this time due to pneumonitis noted on CT 07/2018, Treated with high dose steroids. Cortisol 25.5 on admission.  - Stress steroids.  Anion Gap Metabolic Acidosis, Lactic Acidosis > improving LA: 6.4 >> 3.8 Hypomagnesemia  Hyponatremia  Hypocalcemia - Trend BMP - Replace electrolytes as indicated  GERD -PPI   DM: A1C 5.8 -Trend Glucose  -SSI -Hold Metformin, Lantus   Acute on chronic Thrombocytopenia. Chronic secondary to cancer/therapy, however, now much worse. Likely secondary to sepsis and consumption. Only got one dose of heparin on the 19th. No fevers or AMS to indicate HUS/TTP - Follow closely.  - Check HIT   Anxiety/Depression Acute Pain  -Morphine PRN   Best practice:  Diet: Sips per surgery DVT prophylaxis: SCD GI prophylaxis: PPI Glucose control: SSI Mobility: Bedrest  Code Status: FC Family Communication: Patient and son updated bedside.  Disposition: ICU  Labs   CBC: Recent Labs  Lab 08/27/18 0945 09/01/2018 0732  09/01/18 0048 09/01/18 0535 09/01/18 1312 09/01/18 1840 09/02/18 0318  WBC 11.9* 9.9   < > 2.9* 3.5* 4.0 4.1 6.3  NEUTROABS 9.6* 8.0*  --   --   --   --   --   --   HGB 13.1 13.0   < > 8.0* 13.3 12.1 11.0* 10.7*  HCT 43.3 42.1   < > 25.9* 41.0 36.9 34.3* 33.2*  MCV 88.9 87.7   < > 88.4 86.7 85.2 85.3 85.1  PLT 217 158   < > 59* 47* 32* 38* 25*   < > = values in this interval not displayed.     Basic Metabolic Panel: Recent Labs  Lab 09/12/2018 0732 09/07/2018 2150 09/01/18 0048 09/01/18 0535 09/01/18 1840 09/02/18 0318  NA 129* 127* 136 134* 136 137  K 4.3 5.1 4.8 4.5 4.6 4.5  CL 91* 97* 108 107 109 109  CO2 21* 17* 13* 16* 16* 18*  GLUCOSE 258* 239* 112* 110* 102* 113*  BUN 33* 18 18 20  29* 32*  CREATININE 0.95 0.78 1.00 0.86 1.54* 1.29*  CALCIUM 9.5 7.3* 6.2* 6.9* 7.4* 7.4*  MG 1.3* 1.7 1.8 2.2  --  2.1  PHOS  --  5.5* 5.9*  --   --  4.8*   GFR: Estimated Creatinine Clearance: 47.4 mL/min (A) (by C-G formula based on SCr of 1.29 mg/dL (H)). Recent Labs  Lab 09/01/18 0535 09/01/18 0750 09/01/18 1312 09/01/18 1820 09/01/18 1840 09/01/18 2138 09/02/18 0318  WBC 3.5*  --  4.0  --  4.1  --  6.3  LATICACIDVEN 3.8* 3.9*  --  3.4*  --  2.4*  --     Liver  Function Tests: Recent Labs  Lab 08/27/18 0945 08/26/2018 0732  AST 23 35  ALT 31 44  ALKPHOS 45 59  BILITOT 0.9 0.9  PROT 6.4* 6.1*  ALBUMIN 3.5 3.3*   Recent Labs  Lab 09/07/2018 0732  LIPASE 22   No results for input(s): AMMONIA in the last 168 hours.  ABG    Component Value Date/Time   PHART 7.325 (L) 08/23/2018 2204   PCO2ART 28.2 (L) 09/08/2018 2204   PO2ART 84.0 08/18/2018 2204   HCO3 14.7 (L) 09/08/2018 2204   TCO2 16 (L) 08/18/2018 2204   ACIDBASEDEF 10.0 (H) 08/24/2018 2204   O2SAT 96.0 08/28/2018 2204     Coagulation Profile: Recent Labs  Lab 09/01/18 0048  INR 1.70    Cardiac Enzymes: Recent Labs  Lab 09/01/18 0048 09/01/18 0927 09/01/18 1840 09/01/18 2138 09/02/18 0318  TROPONINI 0.06* 0.07* 0.08* 0.06* 0.06*    HbA1C: Hgb A1c MFr Bld  Date/Time Value Ref Range Status  09/01/2018 05:35 AM 8.5 (H) 4.8 - 5.6 % Final    Comment:    (NOTE) Pre diabetes:          5.7%-6.4% Diabetes:              >6.4% Glycemic control for   <7.0% adults with diabetes   08/08/2018 12:47 PM 9.4 (H) 4.8 - 5.6 % Final    Comment:    (NOTE) Pre diabetes:           5.7%-6.4% Diabetes:              >6.4% Glycemic control for   <7.0% adults with diabetes     CBG: Recent Labs  Lab 09/01/18 1537 09/01/18 1944 09/01/18 2324 09/02/18 0352 09/02/18 0802  GLUCAP 79 88 91 98 95      Critical care time: 45 mins     Georgann Housekeeper, AGACNP-BC [place] Pager [phone] or [phone]  09/02/2018 9:57 AM

## 2018-09-02 NOTE — Progress Notes (Signed)
CRITICAL VALUE ALERT  Critical Value:  Platelets 25  Date & Time Notied:  09/02/2018 0415  Provider Notified: Dr. Redmond Pulling  Orders Received/Actions taken: Awaiting call back for new orders.

## 2018-09-02 NOTE — Progress Notes (Addendum)
[facility] Progress Note  2 Days Post-Op  Subjective: CC: abdominal pain improving  Patient reports she feels better today than yesterday. Denies n/v. Colostomy has began to have some output  Son at bedside.   Objective: Vital signs in last 24 hours: Temp:  [97.4 F (36.3 C)-99 F (37.2 C)] 97.9 F (36.6 C) (01/21 0400) Pulse Rate:  [128-135] 131 (01/21 0600) Resp:  [18-38] 18 (01/21 0600) BP: (92-99)/(49-79) 97/79 (01/20 1530) SpO2:  [89 %-96 %] 96 % (01/21 0808) Arterial Line BP: (93-128)/(48-63) 104/52 (01/21 0630) Last BM Date: 09/01/18  Intake/Output from previous day: 01/20 0701 - 01/21 0700 In: 2425.7 [I.V.:1727.8; Blood:248; IV Piggyback:449.9] Out: 1050 [Urine:700; Drains:280; Stool:70] Intake/Output this shift: No intake/output data recorded.  PE: Gen:  Alert, appears uncomfortable Card:  Sinus tachycardia, pedal pulses 1+ BL, hands mildly edematous BL Pulm:  Normal effort, diminished BL Abd: Soft, nontender, non-distended, stoma pink with stool present in ostomy, VAC to midline wound, JP in RLQ with SS drainage.   Lab Results:  Recent Labs    09/01/18 1840 09/02/18 0318  WBC 4.1 6.3  HGB 11.0* 10.7*  HCT 34.3* 33.2*  PLT 38* 25*   BMET Recent Labs    09/01/18 1840 09/02/18 0318  NA 136 137  K 4.6 4.5  CL 109 109  CO2 16* 18*  GLUCOSE 102* 113*  BUN 29* 32*  CREATININE 1.54* 1.29*  CALCIUM 7.4* 7.4*   PT/INR Recent Labs    09/01/18 0048  LABPROT 19.8*  INR 1.70   CMP     Component Value Date/Time   NA 137 09/02/2018 0318   K 4.5 09/02/2018 0318   CL 109 09/02/2018 0318   CO2 18 (L) 09/02/2018 0318   GLUCOSE 113 (H) 09/02/2018 0318   BUN 32 (H) 09/02/2018 0318   CREATININE 1.29 (H) 09/02/2018 0318   CREATININE 0.78 08/15/2017 0902   CALCIUM 7.4 (L) 09/02/2018 0318   PROT 6.1 (L) 09/05/2018 0732   ALBUMIN 3.3 (L) 08/16/2018 0732   AST 35 09/07/2018 0732   ALT 44 08/24/2018 0732   ALKPHOS 59 08/18/2018 0732    BILITOT 0.9 08/29/2018 0732   GFRNONAA 42 (L) 09/02/2018 0318   GFRNONAA 78 08/15/2017 0902   GFRAA 49 (L) 09/02/2018 0318   GFRAA 90 08/15/2017 0902   Lipase     Component Value Date/Time   LIPASE 22 08/21/2018 0732       Studies/Results: Ct Abdomen Pelvis W Contrast  Result Date: 08/25/2018 CLINICAL DATA:  Generalized abdominal pain. History of lung carcinoma EXAM: CT ABDOMEN AND PELVIS WITH CONTRAST TECHNIQUE: Multidetector CT imaging of the abdomen and pelvis was performed using the standard protocol following bolus administration of intravenous contrast. CONTRAST:  132mL ISOVUE-300 IOPAMIDOL (ISOVUE-300) INJECTION 61% COMPARISON:  PET-CT November 13, 2017 FINDINGS: Lower chest: Lung bases are clear. Hepatobiliary: No focal liver lesions are evident. Gallbladder is absent. The common hepatic is dilated at 12 mm. There is tapering distally without biliary duct mass or calculus. No intrahepatic biliary duct dilatation is evident. Pancreas: There is no pancreatic mass or inflammatory focus. Spleen: No splenic lesions are evident. Adrenals/Urinary Tract: Adrenals appear normal bilaterally. Kidneys bilaterally show no evident mass or hydronephrosis on either side. There is no appreciable renal or ureteral calculus on either side. Urinary bladder is midline with wall thickness within normal limits. Stomach/Bowel: There is evident bowel perforation at the level of the junction of the descending colon and sigmoid colon. Soft tissue air tracks from  this area into the left pelvis with surrounding mesenteric stranding. Free air is seen extending throughout the left abdomen into the mid and upper abdominal regions, reaching the medial left hemidiaphragm near the level of the stomach. There is no associated mass by CT in this area. Elsewhere, no other areas of perforation or evident. There is fluid throughout much of the small bowel which may be indicative of a degree of ileus secondary to the bowel perforation  more distally. No portal venous air is evident. Vascular/Lymphatic: There is aortic atherosclerosis. There is also right common iliac artery atherosclerosis. Major mesenteric arterial vessels appear patent. No adenopathy is appreciable in the abdomen or pelvis. Reproductive: The uterus is absent.  No pelvic masses evident. Other: There is no periappendiceal region inflammation. A surgical clip is seen in the periappendiceal region. Appendix not seen. No abscess or ascites is evident in the abdomen or pelvis. Patient has had previous hernia repair in the periumbilical region with areas of mesh present. Musculoskeletal: There is multilevel arthropathy in the lumbar spine. No blastic or lytic bone lesions are evident. No intramuscular lesions are evident. IMPRESSION: 1. Evident bowel perforation at the junction of the descending colon and sigmoid colon with extensive free air both loculated in the left pelvis as well as extending into the left upper and mid abdomen with free air abutting the medial left hemidiaphragm. Suspect ruptured diverticulitis. No mass seen. It is conceivable that a neoplastic lesion in the colon resulted in bowel wall rupture. 2.  Suspect underlying ileus.  No frank bowel obstruction. 3. Status post cholecystectomy. Prominence of the common hepatic duct with tapering of the common bile duct distally noted. No mass or calculus noted in the biliary ductal system. 4.  No evident renal or ureteral calculus.  No hydronephrosis. 5.  Aortoiliac atherosclerosis. 6.  Status post ventral hernia repair with mesh in place. 7.  Extensive multilevel lumbar arthropathy. Critical Value/emergent results were called by telephone at the time of interpretation on 08/21/2018 at 9:38 am to Dr. Francine Graven , who verbally acknowledged these results. Electronically Signed   By: Lowella Grip III M.D.   On: 09/09/2018 09:38   Dg Chest Port 1 View  Result Date: 09/01/2018 CLINICAL DATA:  Initial evaluation for  acute respiratory failure, pneumonia. Status post abdominal surgery with history of lung cancer. EXAM: PORTABLE CHEST 1 VIEW COMPARISON:  Prior radiograph from 08/18/2018 FINDINGS: Enteric tube courses into the abdomen. Left-sided Port-A-Cath with tip overlying the mid SVC. Stable heart size. Mediastinal silhouette normal. Lungs hypoinflated with elevation left hemidiaphragm. Progressive infiltrate within the mid left lung, compatible with pneumonia. Underlying left upper lobe nodular densities grossly similar to previous. Right lung remains clear. No edema. Probable small left pleural effusion. No pneumothorax. Osseous structures unchanged. IMPRESSION: 1. Progressive mid left lung infiltrate, consistent with pneumonia. 2. Probable small left pleural effusion. Electronically Signed   By: Jeannine Boga M.D.   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 Kieth Brightly - POD#2 - NGT with small volume bilious output, some stool from colostomy - can continue to ice chips for comfort; would wait until having reliable colostomy output before trialing clear liquids - add prn IV robaxin for pain control - JP and VAC with SS drainage, continue to monitor - WOC consult for new stoma, will plan to change [facility] 1/22  Hypotension and tachycardia - Likely secondary to septic shock, on levo - hgb stable Elevated troponins - ?demand ischemia, per CCM Thrombocytopenia - plts down-trending - likely 2/2 sepsis Lactic  acidosis - improving, down to 2.4  FEN: trial of ice chips/sips VTE: SCDs; chemical dvt ppx as per CCM; plt remain low however ID: meropenem 1/19>>   LOS: 2 days   Sharon Mt. Dema Severin, M.D. [facility]

## 2018-09-03 LAB — GLUCOSE, CAPILLARY
GLUCOSE-CAPILLARY: 112 mg/dL — AB (ref 70–99)
Glucose-Capillary: 111 mg/dL — ABNORMAL HIGH (ref 70–99)
Glucose-Capillary: 118 mg/dL — ABNORMAL HIGH (ref 70–99)
Glucose-Capillary: 123 mg/dL — ABNORMAL HIGH (ref 70–99)
Glucose-Capillary: 140 mg/dL — ABNORMAL HIGH (ref 70–99)
Glucose-Capillary: 141 mg/dL — ABNORMAL HIGH (ref 70–99)
Glucose-Capillary: 145 mg/dL — ABNORMAL HIGH (ref 70–99)

## 2018-09-03 LAB — BASIC METABOLIC PANEL
Anion gap: 6 (ref 5–15)
BUN: 27 mg/dL — AB (ref 8–23)
CO2: 24 mmol/L (ref 22–32)
Calcium: 7.4 mg/dL — ABNORMAL LOW (ref 8.9–10.3)
Chloride: 111 mmol/L (ref 98–111)
Creatinine, Ser: 0.84 mg/dL (ref 0.44–1.00)
GFR calc Af Amer: >60 mL/min (ref >60)
GFR calc non Af Amer: >60 mL/min (ref >60)
Glucose, Bld: 134 mg/dL — ABNORMAL HIGH (ref 70–99)
Potassium: 3.7 mmol/L (ref 3.5–5.1)
Sodium: 141 mmol/L (ref 135–145)

## 2018-09-03 LAB — BPAM PLATELET PHERESIS
Blood Product Expiration Date: 202001232359
ISSUE DATE / TIME: 202001212001
Unit Type and Rh: 6200

## 2018-09-03 LAB — BLOOD GAS, ARTERIAL
Acid-Base Excess: 0.3 mmol/L (ref 0.0–2.0)
Bicarbonate: 23.7 mmol/L (ref 20.0–28.0)
Drawn by: 362771
O2 Content: 2.5 L/min
O2 Saturation: 97 %
PATIENT TEMPERATURE: 98.5
pCO2 arterial: 33.6 mmHg (ref 32.0–48.0)
pH, Arterial: 7.462 — ABNORMAL HIGH (ref 7.350–7.450)
pO2, Arterial: 89.9 mmHg (ref 83.0–108.0)

## 2018-09-03 LAB — CBC WITH DIFFERENTIAL/PLATELET
Abs Immature Granulocytes: 0.33 10*3/uL — ABNORMAL HIGH (ref 0.00–0.07)
Basophils Absolute: 0 10*3/uL (ref 0.0–0.1)
Basophils Relative: 1 %
Eosinophils Absolute: 0.9 10*3/uL — ABNORMAL HIGH (ref 0.0–0.5)
Eosinophils Relative: 21 %
HCT: 26.5 % — ABNORMAL LOW (ref 36.0–46.0)
Hemoglobin: 8.5 g/dL — ABNORMAL LOW (ref 12.0–15.0)
Immature Granulocytes: 8 %
Lymphocytes Relative: 7 %
Lymphs Abs: 0.3 10*3/uL — ABNORMAL LOW (ref 0.7–4.0)
MCH: 28 pg (ref 26.0–34.0)
MCHC: 32.1 g/dL (ref 30.0–36.0)
MCV: 87.2 fL (ref 80.0–100.0)
MONO ABS: 0.2 10*3/uL (ref 0.1–1.0)
Monocytes Relative: 5 %
Neutro Abs: 2.4 10*3/uL (ref 1.7–7.7)
Neutrophils Relative %: 58 %
Platelets: 34 10*3/uL — ABNORMAL LOW (ref 150–400)
RBC: 3.04 MIL/uL — ABNORMAL LOW (ref 3.87–5.11)
RDW: 17.7 % — AB (ref 11.5–15.5)
WBC: 4.1 10*3/uL (ref 4.0–10.5)
nRBC: 26.4 % — ABNORMAL HIGH (ref 0.0–0.2)

## 2018-09-03 LAB — MAGNESIUM: Magnesium: 2.2 mg/dL (ref 1.7–2.4)

## 2018-09-03 LAB — PREPARE PLATELET PHERESIS: Unit division: 0

## 2018-09-03 LAB — PATHOLOGIST SMEAR REVIEW

## 2018-09-03 LAB — IMMATURE PLATELET FRACTION: Immature Platelet Fraction: 9.8 % — ABNORMAL HIGH (ref 1.2–8.6)

## 2018-09-03 LAB — HEPARIN INDUCED PLATELET AB (HIT ANTIBODY): Heparin Induced Plt Ab: 0.146 OD (ref 0.000–0.400)

## 2018-09-03 MED ORDER — HYDROMORPHONE HCL 1 MG/ML IJ SOLN
0.5000 mg | Freq: Once | INTRAMUSCULAR | Status: AC
  Administered 2018-09-03: 0.5 mg via INTRAVENOUS
  Filled 2018-09-03 (×2): qty 1

## 2018-09-03 MED ORDER — METOPROLOL TARTRATE 5 MG/5ML IV SOLN
2.5000 mg | INTRAVENOUS | Status: DC | PRN
  Administered 2018-09-03 – 2018-09-06 (×12): 2.5 mg via INTRAVENOUS
  Filled 2018-09-03 (×12): qty 5

## 2018-09-03 NOTE — Progress Notes (Signed)
[facility] Progress Note Patient Name: SANDEE BERNATH DOB: Feb 16, 1946 MRN: [id number]   Date of Service  09/03/2018  HPI/Events of Note  Elevated HR - Looks like NSR with PAC's.   eICU Interventions  Will order: 1. Send AM labs now.  2. Metoprolol 2.5 mg IV Q 3 hours PRN HR > 115.  3. 12 Lead EKG now.      Intervention Category Major Interventions: Arrhythmia - evaluation and management  Khylie Larmore Cornelia Copa 09/03/2018, 3:37 AM

## 2018-09-03 NOTE — Progress Notes (Signed)
eLink Physician-Brief Progress Note Patient Name: [name] DOB: June 17, 1946 MRN: [id number]   Date of Service  09/03/2018  HPI/Events of Note  Confusion - Likely poly factorial. Toxic metabolic from sepsis, Morphine and lack of sleep.   eICU Interventions  Will order: 1. POC Blood glucose now.  2. ABG now.      Intervention Category Major Interventions: Change in mental status - evaluation and management  Breiona Couvillon Eugene 09/03/2018, 4:46 AM

## 2018-09-03 NOTE — Progress Notes (Signed)
HEMATOLOGY  Lab review CBC Latest Ref Rng & Units 09/03/2018 09/02/2018 09/02/2018  WBC 4.0 - 10.5 K/uL 4.1 - 6.3  Hemoglobin 12.0 - 15.0 g/dL 8.5(L) - 10.7(L)  Hematocrit 36.0 - 46.0 % 26.5(L) - 33.2(L)  Platelets 150 - 400 K/uL 34(L) 19(LL) 25(LL)    Immature platelet fraction: Elevated suggestive of consumption as the etiology for the thrombocytopenia. The most likely etiology is related to underlying sepsis and consumption as related to her recent surgery. We will recheck fibrinogen because DIC cannot be entirely ruled out.  Fibrinogen being an acute phase reactant was markedly elevated. Transfused platelets yesterday. Continue to monitor symptoms of bleeding and transfuse platelets as needed.

## 2018-09-03 NOTE — Consult Note (Addendum)
[facility] Nurse wound follow up Wound type:Midline abdominal wound with NPWT (VAC) therapy.  LUQ Colostomy Measurement: 22 cm x 8 cm x 6 cm  Wound UQJ:FHLK nongranulating with 50 % adipose tissue noted in midline obese abdomen.   Drainage (amount, consistency, odor) Moderate serosanguinous drainage  No odor.  Periwound: Colostomy and midline wound 4 cm apart.  Care provided for both simultaneously.  Dressing procedure/placement/frequency:1 piece black foam used.  1 piece convex pouch to colostomy. Seal immediately achieved.  [facility] Nurse ostomy follow up Stoma type/location: LUQ colostomy Stomal assessment/size: 2 " round Peristomal assessment: intact  Treatment options for stomal/peristomal skin: barrier ring Output soft brown stool Ostomy pouching: 1pc.convex with barrier ring.  Cut off center to avoid midline dressing.  Education provided: [facility].  Enrolled patient in [facility] Discharge program:No [facility] team to follow.  Domenic Moras MSN, RN, FNP-BC CWON Wound, Ostomy, Continence Nurse Pager [phone]

## 2018-09-03 NOTE — Progress Notes (Addendum)
Patient ID: [name], female   DOB: 08-11-1946, 73 y.o.   MRN: [id number]    3 Days Post-Op  Subjective: Patient awake and states she has some abdominal pain.  Unsure really how much is coming out of her colostomy.  Foley removed and seems to be voiding on her own.  No nausea or vomiting.  Objective: Vital signs in last 24 hours: Temp:  [97.7 F (36.5 C)-99 F (37.2 C)] 97.7 F (36.5 C) (01/22 0800) Pulse Rate:  [28-138] 124 (01/22 0838) Resp:  [16-40] 21 (01/22 0838) BP: (85-156)/(40-73) 128/61 (01/22 0800) SpO2:  [87 %-100 %] 99 % (01/22 0841) Arterial Line BP: (102-147)/(49-59) 147/59 (01/21 1500) Last BM Date: 09/02/18  Intake/Output from previous day: 01/21 0701 - 01/22 0700 In: 2258.7 [I.V.:1003; Blood:776.3; IV Piggyback:479.3] Out: 1685 [Urine:1550; Drains:125; Stool:10] Intake/Output this shift: No intake/output data recorded.  PE: Heart: tachy Lungs: diminished BS, on O2 Abd: soft, appropriately tender diffusely, obese, hypoactive bowel sounds, stoma is pink and viable.  Minimal output in colostomy pouch.  Midline wound with VAC in place, does appear to have some leakage from ostomy to [facility] noted.  VAC and JP with darker old serosang output  Lab Results:  Recent Labs    09/02/18 0318 09/02/18 1203 09/03/18 0625  WBC 6.3  --  4.1  HGB 10.7*  --  8.5*  HCT 33.2*  --  26.5*  PLT 25* 19* 34*   BMET Recent Labs    09/02/18 0318 09/03/18 0625  NA 137 141  K 4.5 3.7  CL 109 111  CO2 18* 24  GLUCOSE 113* 134*  BUN 32* 27*  CREATININE 1.29* 0.84  CALCIUM 7.4* 7.4*   PT/INR Recent Labs    09/01/18 0048 09/02/18 1203  LABPROT 19.8* 18.6*  INR 1.70 1.57   CMP     Component Value Date/Time   NA 141 09/03/2018 0625   K 3.7 09/03/2018 0625   CL 111 09/03/2018 0625   CO2 24 09/03/2018 0625   GLUCOSE 134 (H) 09/03/2018 0625   BUN 27 (H) 09/03/2018 0625   CREATININE 0.84 09/03/2018 0625   CREATININE 0.78 08/15/2017 0902   CALCIUM 7.4 (L)  09/03/2018 0625   PROT 6.1 (L) 08/21/2018 0732   ALBUMIN 3.3 (L) 09/10/2018 0732   AST 35 08/29/2018 0732   ALT 44 09/09/2018 0732   ALKPHOS 59 08/20/2018 0732   BILITOT 0.9 08/26/2018 0732   GFRNONAA >60 09/03/2018 0625   GFRNONAA 78 08/15/2017 0902   GFRAA >60 09/03/2018 0625   GFRAA 90 08/15/2017 0902   Lipase     Component Value Date/Time   LIPASE 22 08/27/2018 0732       Studies/Results: No results found.  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 Kieth Brightly - POD#3 - cont NPO until better colostomy output, but will allow a few sips of clears from the floor - prn IV robaxin for pain control - JP and VAC with SS drainage, continue to monitor - WOC consult for  new stoma, will plan to change VAC 1/22 on MWF schedule  Hypotension and tachycardia  -hypotension resolving, off levo -still tachy, defer to medicine Elevated troponins - ?demand ischemia, per medicine Thrombocytopenia - plts down-trending, down to 19K today.  Defer to medicine and hematology. Lactic acidosis - improving  FEN: trial of ice chips/sips of clears from floor VTE: SCDs; chemical dvt on hold secondary to low plts ID: meropenem 1/19>>   LOS: 3 days    Henreitta Cea , [facility] 09/03/2018, 9:07 AM Pager: [phone]

## 2018-09-03 NOTE — Progress Notes (Signed)
Pharmacy Antibiotic Note  [name] is a 73 y.o. female admitted on 09/06/2018 with intra-abdominal infection.  Pharmacy has been consulted for #4 Merrem dosing.  ID: afebrile  WBC 4.1. LA 10.5>3.9>2.4. Scr WNL 1/20: CXR with LML infiltrate  Merrem 1/19 >>   1/19: BC x 2>> 1/19 UCx: 50,000 multiple species. Recollect   Plan: Merrem 1000 mg IV every 8 hours.    Height: 5\' 3"  (160 cm) Weight: 234 lb 12.6 oz (106.5 kg) IBW/kg (Calculated) : 52.4  Temp (24hrs), Avg:98.5 F (36.9 C), Min:97.7 F (36.5 C), Max:99 F (37.2 C)  Recent Labs  Lab 09/01/18 0040 09/01/18 0048 09/01/18 0535 09/01/18 0750 09/01/18 1312 09/01/18 1820 09/01/18 1840 09/01/18 2138 09/02/18 0318 09/03/18 0625  WBC  --  2.9* 3.5*  --  4.0  --  4.1  --  6.3 4.1  CREATININE  --  1.00 0.86  --   --   --  1.54*  --  1.29* 0.84  LATICACIDVEN 10.5*  --  3.8* 3.9*  --  3.4*  --  2.4*  --   --     Estimated Creatinine Clearance: 72.8 mL/min (by C-G formula based on SCr of 0.84 mg/dL).    Allergies  Allergen Reactions  . Bee Venom Anaphylaxis  . Penicillins Itching    Blisters in mouth after dental work Has patient had a PCN reaction causing immediate rash, facial/tongue/throat swelling, SOB or lightheadedness with hypotension: no Has patient had a PCN reaction causing severe rash involving mucus membranes or skin necrosis: No Has patient had a PCN reaction that required hospitalization No Has patient had a PCN reaction occurring within the last 10 years: Yes If all of the above answers are "NO", then may proceed with Cephalosporin use.   . Codeine   . Motrin [Ibuprofen]     "messes" with her head.   [name] S. Alford Highland, PharmD, BCPS Clinical Staff Pharmacist [facility] Ghazi [facility] 09/03/2018 9:51 AM

## 2018-09-03 NOTE — Progress Notes (Signed)
Pt. Steadily increasing HR (130-140's) with increased confusion. Called El[facility] and received new orders to control HR. Will continue to monitor.   Leticia Clas, RN BSN

## 2018-09-03 NOTE — Progress Notes (Addendum)
PROGRESS NOTE    [name]  ZDG:[id number] DOB: 05/16/1946 DOA: 08/16/2018 PCP: Fayrene Helper, MD    Brief Narrative:  73 year old female presents to ED on 1/19 with reported abdominal pain for 1 week with diarrhea. CT A/P with bowel perforation at the level of the descending colon and sigmoid colon with air tracking up to the left pelvis. Surgery consulted. Taken to OR. Found to have perforated diverticulitis. She underwent colectomy/colostomy on 1/19. Also had some evidence of pneumonia on x-ray.  Patient also has been having thrombocytopenia.  Status post blood and plasma transfusion. Hematology following.   Assessment & Plan:   Active Problems:   Morbid obesity (HCC)   Anxiety and depression   COPD (chronic obstructive pulmonary disease) (HCC)   Squamous cell lung cancer, left (HCC)   Diverticulitis of colon with perforation   Uncontrolled type 2 diabetes mellitus with hyperglycemia (HCC)   S/P exploratory laparotomy   Hypertensive urgency   Pressure injury of skin   Diverticulitis with perforation and associated peritonitis s/p Colectomy/Colostomy on 1/19 - Surgery following.  - Continue Sips and chips today per surgery  - Wound Vac in Place, changed yesterday 1/22. - Monitor JP output.  -Cultures do not show any growth except urine culture showing mixed organisms. Currently on Meropenem   Acute Hypoxic Respiratory Failure in setting of post-operative atelectasis. CXR 1/20 per report possible PNA. Improving -On oxygen by nasal cannula.- Encourage incentive spirometry - On meropenem -Patient unable to produce sputum.      COPD without acute exacerbation - Continue Nebs, Pulmicort/Dulera   Elevated Troponin secondary to demand ischemia  HFpEF (grade 1 DD on echo) Sinus tachycardia with new TWI V1-V4. Troponin very mildly and briefly elevated, but now headed back down.  - Pain could also be contributing to the tachycardia.  Morphine not helping.  She has  allergy listed to codeine but unable to specify the reaction.  Will try Dilaudid.  If Dilaudid helping will consider switching to Dilaudid as needed for pain.  Continue to monitor on telemetry. - Takes Cardizem at home.  Blood pressure stable at this time.  We will try to restart Cardizem once able to take PO. -Continue to monitor and adjust medications accordingly.  - Echo pending.   H/O Stage III Squamous cell lung cancer of left lung: Oncology Followed by Delton Coombes > Was on consolidation therapy with [place]valumb every 2 weeks for 12 months, however held at this time due to pneumonitis noted on CT 07/2018, Treated with high dose steroids. Cortisol 25.5 on admission.  - On stress steroids.  Anion Gap Metabolic Acidosis, Lactic Acidosis > improving LA: 6.4 >> 3.8 Hypomagnesemia  Hyponatremia  Hypocalcemia - Improving. - Follow BMP - Replace electrolytes as indicated  GERD -PPI   DM: A1C 5.8 -Monitor glucose  -SSI -Hold Metformin, Lantus  -Currently n.p.o. except ice chips.  Acute on chronic Thrombocytopenia. Chronic secondary to cancer/therapy. However, now severe.  Likely secondary to sepsis and consumption. Only got one dose of heparin on the 19th. No fevers or AMS to indicate HUS/TTP - Check HIT  -Hematology following.  Continue to monitor platelets and transfuse as needed.  Anxiety/Depression Acute Pain  -Morphine PRN  -However, morphine not helping.  Ordered 1 dose of Dilaudid.  She has allergy listed to codeine but unable to tell me the reaction. If she is tolerating and Dilaudid is helping, then will switch to Dilaudid.   DVT prophylaxis: SCDs due to thrombocytopenia Code Status: Full  code Family Communication: No family at bedside Disposition Plan: Unknown at this time  Consultants:   Surgery  Hematology/oncology   Procedures:  colectomy/colostomy on 1/19  Antimicrobials:  Meropenem 1/19 >>     Subjective: Complaining of abdominal pain.  Wound VAC  being changed at this time.  Objective: Vitals:   09/03/18 0841 09/03/18 0900 09/03/18 1000 09/03/18 1200  BP:   140/74   Pulse:  (!) 123 (!) 123   Resp:  18 20   Temp:    97.7 F (36.5 C)  TempSrc:    Oral  SpO2: 99% 100% (!) 89%   Weight:      Height:        Intake/Output Summary (Last 24 hours) at 09/03/2018 1237 Last data filed at 09/03/2018 1100 Gross per 24 hour  Intake 2033.04 ml  Output 1525 ml  Net 508.04 ml   Filed Weights   09/05/2018 1350 08/18/2018 2110  Weight: 99.2 kg 106.5 kg    Examination:  General exam: Obese female, in moderate distress due to abdominal pain Respiratory system: Decreased breath sounds lower lobes, no rhonchi or wheezing heard at this time Cardiovascular system: S1 & S2 heard, RRR. No JVD, murmurs, rubs, gallops or clicks. No pedal edema. Gastrointestinal system: Has wound VAC, ostomy in place, diminished bowel sounds Central nervous system: Alert and oriented. No focal neurological deficits. Extremities: Symmetric 5 x 5 power. Skin: No rashes, lesions or ulcers Psychiatry: Judgement and insight appear normal. Mood & affect appropriate.     Data Reviewed: I have personally reviewed following labs and imaging studies  CBC: Recent Labs  Lab 08/14/2018 0732  09/01/18 0535 09/01/18 1312 09/01/18 1840 09/02/18 0318 09/02/18 1203 09/03/18 0625  WBC 9.9   < > 3.5* 4.0 4.1 6.3  --  4.1  NEUTROABS 8.0*  --   --   --   --   --   --  2.4  HGB 13.0   < > 13.3 12.1 11.0* 10.7*  --  8.5*  HCT 42.1   < > 41.0 36.9 34.3* 33.2*  --  26.5*  MCV 87.7   < > 86.7 85.2 85.3 85.1  --  87.2  PLT 158   < > 47* 32* 38* 25* 19* 34*   < > = values in this interval not displayed.   Basic Metabolic Panel: Recent Labs  Lab 08/16/2018 2150 09/01/18 0048 09/01/18 0535 09/01/18 1840 09/02/18 0318 09/03/18 0625  NA 127* 136 134* 136 137 141  K 5.1 4.8 4.5 4.6 4.5 3.7  CL 97* 108 107 109 109 111  CO2 17* 13* 16* 16* 18* 24  GLUCOSE 239* 112* 110* 102*  113* 134*  BUN 18 18 20  29* 32* 27*  CREATININE 0.78 1.00 0.86 1.54* 1.29* 0.84  CALCIUM 7.3* 6.2* 6.9* 7.4* 7.4* 7.4*  MG 1.7 1.8 2.2  --  2.1 2.2  PHOS 5.5* 5.9*  --   --  4.8*  --    GFR: Estimated Creatinine Clearance: 72.8 mL/min (by C-G formula based on SCr of 0.84 mg/dL). Liver Function Tests: Recent Labs  Lab 08/29/2018 0732  AST 35  ALT 44  ALKPHOS 59  BILITOT 0.9  PROT 6.1*  ALBUMIN 3.3*   Recent Labs  Lab 09/08/2018 0732  LIPASE 22   No results for input(s): AMMONIA in the last 168 hours. Coagulation Profile: Recent Labs  Lab 09/01/18 0048 09/02/18 1203  INR 1.70 1.57   Cardiac Enzymes: Recent Labs  Lab 09/01/18  1610 09/01/18 1840 09/01/18 2138 09/02/18 0318 09/02/18 0946  TROPONINI 0.07* 0.08* 0.06* 0.06* 0.05*   BNP (last 3 results) No results for input(s): PROBNP in the last 8760 hours. HbA1C: Recent Labs    09/01/18 0535  HGBA1C 8.5*   CBG: Recent Labs  Lab 09/02/18 2333 09/03/18 0339 09/03/18 0450 09/03/18 0805 09/03/18 1143  GLUCAP 106* 145* 118* 111* 123*   Lipid Profile: No results for input(s): CHOL, HDL, LDLCALC, TRIG, CHOLHDL, LDLDIRECT in the last 72 hours. Thyroid Function Tests: No results for input(s): TSH, T4TOTAL, FREET4, T3FREE, THYROIDAB in the last 72 hours. Anemia Panel: No results for input(s): VITAMINB12, FOLATE, FERRITIN, TIBC, IRON, RETICCTPCT in the last 72 hours. Sepsis Labs: Recent Labs  Lab 09/01/18 0535 09/01/18 0750 09/01/18 1820 09/01/18 2138  LATICACIDVEN 3.8* 3.9* 3.4* 2.4*    Recent Results (from the past 240 hour(s))  Stool culture     Status: None   Collection Time: 08/27/18 11:13 AM  Result Value Ref Range Status   Salmonella/Shigella Screen Final report  Final   Campylobacter Culture Final report  Final   E coli, Shiga toxin Assay Negative Negative Final    Comment: (NOTE) Performed At: [facility] [place], [place] [id number] Rush Farmer MD JX:[id number]     C Difficile Quick Screen w PCR reflex     Status: None   Collection Time: 08/27/18 11:13 AM  Result Value Ref Range Status   C Diff antigen NEGATIVE NEGATIVE Final   C Diff toxin NEGATIVE NEGATIVE Final   C Diff interpretation No C. difficile detected.  Final    Comment: Performed at [facility], [address], [place], [place] [address]  STOOL CULTURE REFLEX - RSASHR     Status: None   Collection Time: 08/27/18 11:13 AM  Result Value Ref Range Status   Stool Culture result 1 (RSASHR) Comment  Final    Comment: (NOTE) No Salmonella or Shigella recovered. Performed At: [facility] [address] [place], [place] [id number] Rush Farmer MD [id number]   STOOL CULTURE Reflex - CMPCXR     Status: None   Collection Time: 08/27/18 11:13 AM  Result Value Ref Range Status   Stool Culture result 1 (CMPCXR) Comment  Final    Comment: (NOTE) No Campylobacter species isolated. Performed At: [facility] [place], [place] [id number] Rush Farmer MD IH:[id number]   Urine culture     Status: Abnormal   Collection Time: 08/26/2018  8:00 AM  Result Value Ref Range Status   Specimen Description URINE, CLEAN CATCH  Final   Special Requests   Final    NONE Performed at [facility], [address] [place], [place] [address]    Culture (A)  Final    50,000 COLONIES/mL MULTIPLE SPECIES PRESENT, SUGGEST RECOLLECTION   Report Status 09/01/2018 FINAL  Final  Culture, blood (routine x 2)     Status: None (Preliminary result)   Collection Time: 08/17/2018 10:05 PM  Result Value Ref Range Status   Specimen Description BLOOD RIGHT HAND  Final   Special Requests   Final    BOTTLES DRAWN AEROBIC ONLY Blood Culture adequate volume   Culture   Final    NO GROWTH 3 DAYS Performed at [facility], [place] [address], [place], [place] [address]    Report Status PENDING  Incomplete  Culture, blood (routine x 2)     Status: None (Preliminary result)   Collection  Time: 09/10/2018 10:05 PM  Result Value Ref Range Status   Specimen Description BLOOD LEFT HAND  Final   Special Requests   Final    BOTTLES DRAWN AEROBIC ONLY Blood Culture results may not be optimal due to an inadequate volume of blood received in culture bottles   Culture   Final    NO GROWTH 3 DAYS Performed at [facility] [address] [place], [place] [address]    Report Status PENDING  Incomplete         Radiology Studies: No results found.      Scheduled Meds: . arformoterol  15 mcg Nebulization BID  . budesonide (PULMICORT) nebulizer solution  0.5 mg Nebulization BID  . chlorhexidine  15 mL Mouth Rinse BID  . hydrocortisone sod succinate (SOLU-CORTEF) inj  50 mg Intravenous Q8H  . ipratropium-albuterol  3 mL Nebulization BID  . mouth rinse  15 mL Mouth Rinse q12n4p  . pantoprazole (PROTONIX) IV  40 mg Intravenous Q24H   Continuous Infusions: . sodium chloride Stopped (09/02/18 2307)  . lactated ringers 50 mL/hr at 09/03/18 1100  . meropenem (MERREM) IV Stopped (09/03/18 0315)  . methocarbamol (ROBAXIN) IV Stopped (09/01/18 1638)  . norepinephrine (LEVOPHED) Adult infusion Stopped (09/02/18 0833)     LOS: 3 days    Time spent: 35 min    Pilar Corrales, MD [facility] Pager on [place]  If 7PM-7AM, please contact night-coverage [email or web address] Password [facility] 09/03/2018, 12:37 PM

## 2018-09-04 ENCOUNTER — Other Ambulatory Visit (HOSPITAL_COMMUNITY): Payer: Self-pay

## 2018-09-04 ENCOUNTER — Ambulatory Visit (HOSPITAL_COMMUNITY): Payer: Self-pay | Admitting: Hematology

## 2018-09-04 DIAGNOSIS — J9601 Acute respiratory failure with hypoxia: Secondary | ICD-10-CM

## 2018-09-04 LAB — GLUCOSE, CAPILLARY
GLUCOSE-CAPILLARY: 228 mg/dL — AB (ref 70–99)
Glucose-Capillary: 153 mg/dL — ABNORMAL HIGH (ref 70–99)
Glucose-Capillary: 163 mg/dL — ABNORMAL HIGH (ref 70–99)
Glucose-Capillary: 151 mg/dL — ABNORMAL HIGH (ref 70–99)
Glucose-Capillary: 228 mg/dL — ABNORMAL HIGH (ref 70–99)

## 2018-09-04 LAB — BASIC METABOLIC PANEL
Anion gap: 9 (ref 5–15)
BUN: 23 mg/dL (ref 8–23)
CALCIUM: 7.4 mg/dL — AB (ref 8.9–10.3)
CO2: 24 mmol/L (ref 22–32)
Chloride: 111 mmol/L (ref 98–111)
Creatinine, Ser: 0.65 mg/dL (ref 0.44–1.00)
GFR calc Af Amer: >60 mL/min (ref >60)
Glucose, Bld: 164 mg/dL — ABNORMAL HIGH (ref 70–99)
Potassium: 3.5 mmol/L (ref 3.5–5.1)
SODIUM: 144 mmol/L (ref 135–145)

## 2018-09-04 MED ORDER — METHOCARBAMOL 500 MG PO TABS: 750.0000 mg | ORAL_TABLET | Freq: Four times a day (QID) | ORAL | Status: DC | PRN

## 2018-09-04 MED ORDER — INSULIN ASPART 100 UNIT/ML ~~LOC~~ SOLN
0.0000 [IU] | Freq: Every day | SUBCUTANEOUS | Status: DC
  Administered 2018-09-04 – 2018-09-05 (×2): 2 [IU] via SUBCUTANEOUS

## 2018-09-04 MED ORDER — INSULIN ASPART 100 UNIT/ML ~~LOC~~ SOLN
0.0000 [IU] | Freq: Three times a day (TID) | SUBCUTANEOUS | Status: DC
  Administered 2018-09-04 – 2018-09-06 (×5): 3 [IU] via SUBCUTANEOUS

## 2018-09-04 MED ORDER — METHOCARBAMOL 1000 MG/10ML IJ SOLN
500.0000 mg | Freq: Three times a day (TID) | INTRAVENOUS | Status: DC | PRN
  Filled 2018-09-04 (×2): qty 5

## 2018-09-04 MED ORDER — HYDROCORTISONE NA SUCCINATE PF 100 MG IJ SOLR
50.0000 mg | Freq: Two times a day (BID) | INTRAMUSCULAR | Status: DC
  Administered 2018-09-04 – 2018-09-09 (×10): 50 mg via INTRAVENOUS
  Filled 2018-09-04 (×10): qty 2

## 2018-09-04 MED ORDER — LIDOCAINE 5 % EX PTCH: 1.0000 | MEDICATED_PATCH | CUTANEOUS | Status: DC

## 2018-09-04 NOTE — Evaluation (Signed)
Physical Therapy Evaluation Patient Details Name: [name] MRN: [id number] DOB: 1945/08/19 Today's Date: 09/04/2018   History of Present Illness  [name] is a 73 y.o. 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  Evaluation in the emergency department including a CT of the abdomen pelvis showed a bowel perforation at the level of the descending colon and sigmoid colon with air tracking up to the left pelvis with surrounding mesenteric stranding.    Clinical Impression  Pt admitted with/for abdominal pain due to a perforated bowel.  Pt is presently in much pain and is significantly weak.  She is not near her usual baseline function.  Pt currently limited functionally due to the problems listed. ( See problems list.)   Pt will benefit from PT to maximize function and safety in order to get ready for next venue listed below.     Follow Up Recommendations SNF;Supervision/Assistance - 24 hour    Equipment Recommendations  Other (comment)(TBA)    Recommendations for Other Services       Precautions / Restrictions Precautions Precautions: Fall      Mobility  Bed Mobility Overal bed mobility: Needs Assistance Bed Mobility: Rolling;Sidelying to Sit;Sit to Supine Rolling: Max assist;+2 for physical assistance Sidelying to sit: Max assist;+2 for physical assistance   Sit to supine: Total assist;+2 for physical assistance      Transfers                 General transfer comment: unable, pt too painful  Ambulation/Gait                Stairs            Wheelchair Mobility    Modified Rankin (Stroke Patients Only)       Balance Overall balance assessment: Needs assistance Sitting-balance support: Single extremity supported;Bilateral upper extremity supported Sitting balance-Leahy  Scale: Poor Sitting balance - Comments: pt listing to the right, painful when sitting upright                                     Pertinent Vitals/Pain Pain Assessment: Faces Faces Pain Scale: Hurts whole lot Pain Location: abdomen Pain Descriptors / Indicators: Grimacing;Guarding;Moaning Pain Intervention(s): Limited activity within patient's tolerance;Monitored during session;Repositioned    Home Living Family/patient expects to be discharged to:: Private residence Living Arrangements: Spouse/significant other Available Help at Discharge: Family Type of Home: House Home Access: Stairs to enter Entrance Stairs-Rails: Right Entrance Stairs-Number of Steps: 4 Home Layout: One level        Prior Function Level of Independence: Needs assistance   Gait / Transfers Assistance Needed: household ambulator without AD           Hand Dominance        Extremity/Trunk Assessment   Upper Extremity Assessment Upper Extremity Assessment: Generalized weakness(R more functional to help reposition)    Lower Extremity Assessment Lower Extremity Assessment: Generalized weakness       Communication   Communication: (grunted in the negative or affirmative)  Cognition Arousal/Alertness: Lethargic;Awake/alert Behavior During Therapy: Flat affect Overall Cognitive Status: No family/caregiver present to determine baseline cognitive functioning  General Comments General comments (skin integrity, edema, etc.):       Exercises Other Exercises Other Exercises: Warm up LE ROM prior to mobility   Assessment/Plan    PT Assessment Patient needs continued PT services  PT Problem List Decreased strength;Decreased activity tolerance;Decreased balance;Decreased mobility;Pain;Cardiopulmonary status limiting activity       PT Treatment Interventions DME instruction;Gait training;Functional mobility training;Therapeutic  activities;Balance training;Patient/family education    PT Goals (Current goals can be found in the Care Plan section)  Acute Rehab PT Goals Patient Stated Goal: pt unable PT Goal Formulation: Patient unable to participate in goal setting Time For Goal Achievement: 09/18/18 Potential to Achieve Goals: Good    Frequency Min 3X/week   Barriers to discharge        Co-evaluation               AM-PAC PT "6 Clicks" Mobility  Outcome Measure Help needed turning from your back to your side while in a flat bed without using bedrails?: Total Help needed moving from lying on your back to sitting on the side of a flat bed without using bedrails?: Total Help needed moving to and from a bed to a chair (including a wheelchair)?: Total Help needed standing up from a chair using your arms (e.g., wheelchair or bedside chair)?: Total Help needed to walk in hospital room?: Total Help needed climbing 3-5 steps with a railing? : Total 6 Click Score: 6    End of Session Equipment Utilized During Treatment: Other (comment) Activity Tolerance: Patient limited by pain Patient left: in bed;with call bell/phone within reach;with bed alarm set Nurse Communication: Mobility status PT Visit Diagnosis: Other abnormalities of gait and mobility (R26.89);Muscle weakness (generalized) (M62.81);Pain Pain - part of body: (abdomen)    Time: 3976-7341 PT Time Calculation (min) (ACUTE ONLY): 29 min   Charges:   PT Evaluation $PT Eval Moderate Complexity: 1 Mod PT Treatments $Self Care/Home Management: 8-22        09/04/2018  Donnella Sham, PT Acute Rehabilitation Services [phone]  (pager) [phone]  (office)  Tessie Fass Charne Mcbrien 09/04/2018, 7:15 PM

## 2018-09-04 NOTE — Progress Notes (Addendum)
PROGRESS NOTE    [name]  PYP:[id number]  DOB: 02-23-1946  DOA: 08/27/2018 PCP: Fayrene Helper, MD  Brief Narrative:   73 year old female with history of COPD, stage III squamous cell lung cancer who has been following Dr. Delton Coombes on chemotherapy as outpatient, chronic thrombocytopenia, anxiety/depression, diabetes mellitus, diastolic CHF on Lasix admitted on January 19 with abdominal pain/diarrhea.  CT abdomen pelvis showed perforated diverticulitis at the level of descending colon/sigmoid colon with air tracking up to left pelvis.  Patient seen by general surgery and underwent colectomy/colostomy. Chest x-ray reported mid left lung infiltrate: Atelectasis versus pneumonia.  Patient also treated with high-dose steroids during the hospital course.  Cortisol level however was 25.5 on admission.  (Patient on chronic prednisone 40 mg twice daily and Bactrim 3 times a week).  Hospital course complicated by acute on chronic thrombocytopenia.  She has received blood transfusions and plasma transfusion.  Seen by hematology and felt to have consumption thrombocytopenia.  Subjective: Patient still n.p.o.  and been on IV fluids lactated Ringer's at 50 cc/h since admission.  General surgery has cleared for clear liquid diet today.  Patient however been somnolent with generalized weakness since surgery.  She is minimally communicative.  Nurse to try bedside swallow eval before starting clear liquid diet.  Objective: Vitals:   09/04/18 0900 09/04/18 1000 09/04/18 1100 09/04/18 1200  BP: (!) 144/69 131/64 (!) 126/49 (!) 128/54  Pulse: (!) 119 (!) 118 (!) 121   Resp: 15 (!) 22 16   Temp:    98 F (36.7 C)  TempSrc:    Oral  SpO2: 96% 97% 100% 100%  Weight:      Height:        Intake/Output Summary (Last 24 hours) at 09/04/2018 1338 Last data filed at 09/04/2018 1200 Gross per 24 hour  Intake 1135.25 ml  Output 830 ml  Net 305.25 ml   Filed Weights   08/27/2018 1350 08/20/2018 2110    Weight: 99.2 kg 106.5 kg    Physical Examination:  General exam: Appears calm and comfortable  Respiratory system: Clear to auscultation. Respiratory effort normal. Cardiovascular system: S1 & S2 heard, RRR. No JVD, murmurs, rubs, gallops or clicks. No pedal edema. Gastrointestinal system: Abdomen is distended, abdominal wound with wound VAC in place.  Colostomy bag in place along with JP drain with less than 10 mL in the drainage bag.  No organomegaly or masses felt. Normal bowel sounds heard. Central nervous system: Somnolent. No focal neurological deficits. Extremities: Symmetric 5 x 5 power. Skin: No rashes, lesions or ulcers Psychiatry: Could not assess but appears calm    Data Reviewed: I have personally reviewed following labs and imaging studies  CBC: Recent Labs  Lab 08/26/2018 0732  09/01/18 0535 09/01/18 1312 09/01/18 1840 09/02/18 0318 09/02/18 1203 09/03/18 0625  WBC 9.9   < > 3.5* 4.0 4.1 6.3  --  4.1  NEUTROABS 8.0*  --   --   --   --   --   --  2.4  HGB 13.0   < > 13.3 12.1 11.0* 10.7*  --  8.5*  HCT 42.1   < > 41.0 36.9 34.3* 33.2*  --  26.5*  MCV 87.7   < > 86.7 85.2 85.3 85.1  --  87.2  PLT 158   < > 47* 32* 38* 25* 19* 34*   < > = values in this interval not displayed.   Basic Metabolic Panel: Recent Labs  Lab 09/04/2018 2150  09/01/18 0048 09/01/18 0535 09/01/18 1840 09/02/18 0318 09/03/18 0625 09/04/18 0435  NA 127* 136 134* 136 137 141 144  K 5.1 4.8 4.5 4.6 4.5 3.7 3.5  CL 97* 108 107 109 109 111 111  CO2 17* 13* 16* 16* 18* 24 24  GLUCOSE 239* 112* 110* 102* 113* 134* 164*  BUN 18 18 20  29* 32* 27* 23  CREATININE 0.78 1.00 0.86 1.54* 1.29* 0.84 0.65  CALCIUM 7.3* 6.2* 6.9* 7.4* 7.4* 7.4* 7.4*  MG 1.7 1.8 2.2  --  2.1 2.2  --   PHOS 5.5* 5.9*  --   --  4.8*  --   --    GFR: Estimated Creatinine Clearance: 76.4 mL/min (by C-G formula based on SCr of 0.65 mg/dL). Liver Function Tests: Recent Labs  Lab 09/09/2018 0732  AST 35  ALT 44   ALKPHOS 59  BILITOT 0.9  PROT 6.1*  ALBUMIN 3.3*   Recent Labs  Lab 08/22/2018 0732  LIPASE 22   No results for input(s): AMMONIA in the last 168 hours. Coagulation Profile: Recent Labs  Lab 09/01/18 0048 09/02/18 1203  INR 1.70 1.57   Cardiac Enzymes: Recent Labs  Lab 09/01/18 0927 09/01/18 1840 09/01/18 2138 09/02/18 0318 09/02/18 0946  TROPONINI 0.07* 0.08* 0.06* 0.06* 0.05*   BNP (last 3 results) No results for input(s): PROBNP in the last 8760 hours. HbA1C: No results for input(s): HGBA1C in the last 72 hours. CBG: Recent Labs  Lab 09/03/18 2019 09/03/18 2333 09/04/18 0352 09/04/18 0740 09/04/18 1212  GLUCAP 141* 140* 153* 163* 151*   Lipid Profile: No results for input(s): CHOL, HDL, LDLCALC, TRIG, CHOLHDL, LDLDIRECT in the last 72 hours. Thyroid Function Tests: No results for input(s): TSH, T4TOTAL, FREET4, T3FREE, THYROIDAB in the last 72 hours. Anemia Panel: No results for input(s): VITAMINB12, FOLATE, FERRITIN, TIBC, IRON, RETICCTPCT in the last 72 hours. Sepsis Labs: Recent Labs  Lab 09/01/18 0535 09/01/18 0750 09/01/18 1820 09/01/18 2138  LATICACIDVEN 3.8* 3.9* 3.4* 2.4*    Recent Results (from the past 240 hour(s))  Stool culture     Status: None   Collection Time: 08/27/18 11:13 AM  Result Value Ref Range Status   Salmonella/Shigella Screen Final report  Final   Campylobacter Culture Final report  Final   E coli, Shiga toxin Assay Negative Negative Final    Comment: (NOTE) Performed At: [facility] [place], [place] [id number] Rush Farmer MD OZ:[id number]   C Difficile Quick Screen w PCR reflex     Status: None   Collection Time: 08/27/18 11:13 AM  Result Value Ref Range Status   C Diff antigen NEGATIVE NEGATIVE Final   C Diff toxin NEGATIVE NEGATIVE Final   C Diff interpretation No C. difficile detected.  Final    Comment: Performed at [facility] [address] [place], [place] [address]  STOOL  CULTURE REFLEX - RSASHR     Status: None   Collection Time: 08/27/18 11:13 AM  Result Value Ref Range Status   Stool Culture result 1 (RSASHR) Comment  Final    Comment: (NOTE) No Salmonella or Shigella recovered. Performed At: [facility] [address] [place], [place] [id number] Rush Farmer MD RJ:[id number]   STOOL CULTURE Reflex - CMPCXR     Status: None   Collection Time: 08/27/18 11:13 AM  Result Value Ref Range Status   Stool Culture result 1 (CMPCXR) Comment  Final    Comment: (NOTE) No Campylobacter species isolated. Performed At: [facility] [place], [place] [id number] Rush Farmer MD OE:[id number]   Urine culture     Status: Abnormal   Collection Time: 09/05/2018  8:00 AM  Result Value Ref Range Status   Specimen Description URINE, CLEAN CATCH  Final   Special Requests   Final    NONE Performed at [facility], [address] [place], [place] [address]    Culture (A)  Final    50,000 COLONIES/mL MULTIPLE SPECIES PRESENT, SUGGEST RECOLLECTION   Report Status 09/01/2018 FINAL  Final  Culture, blood (routine x 2)     Status: None (Preliminary result)   Collection Time: 08/18/2018 10:05 PM  Result Value Ref Range Status   Specimen Description BLOOD RIGHT HAND  Final   Special Requests   Final    BOTTLES DRAWN AEROBIC ONLY Blood Culture adequate volume   Culture   Final    NO GROWTH 4 DAYS Performed at [facility], [place] [address], [place], [place] [address]    Report Status PENDING  Incomplete  Culture, blood (routine x 2)     Status: None (Preliminary result)   Collection Time: 09/07/2018 10:05 PM  Result Value Ref Range Status   Specimen Description BLOOD LEFT HAND  Final   Special Requests   Final    BOTTLES DRAWN AEROBIC ONLY Blood Culture results may not be optimal due to an inadequate volume of blood received in culture bottles   Culture   Final    NO GROWTH 4 DAYS Performed at [facility], [place] [address], [place], [place] [address]    Report Status PENDING  Incomplete      Radiology Studies: No results found.      Scheduled Meds: . arformoterol  15 mcg Nebulization BID  . budesonide (PULMICORT) nebulizer solution  0.5 mg Nebulization BID  . chlorhexidine  15 mL Mouth Rinse BID  . hydrocortisone sod succinate (SOLU-CORTEF) inj  50 mg Intravenous Q8H  . insulin aspart  0-5 Units Subcutaneous QHS  . insulin aspart  0-9 Units Subcutaneous TID WC  . ipratropium-albuterol  3 mL Nebulization BID  . mouth rinse  15 mL Mouth Rinse q12n4p  . pantoprazole (PROTONIX) IV  40 mg Intravenous Q24H   Continuous Infusions: . sodium chloride Stopped (09/02/18 2307)  . lactated ringers 50 mL/hr at 09/04/18 1200  . meropenem (MERREM) IV Stopped (09/04/18 0654)  . methocarbamol (ROBAXIN) IV      Assessment & Plan:    1.  Perforated diverticulitis with sepsis present on admission: Lactate 6.4 on presentation now improving and downtrending.  Status post surgery/colostomy/wound VAC in place.  Cleared for diet today.  Bedside swallow eval by nurse before starting diet.  Patient may need NG tube feeds/parenteral nutrition if fails swallow eval while she has been only on IV fluids since admission.  Patient has been receiving electrolyte replacements for hypomagnesemia/hypocalcemia as needed.  Patient on IV meropenem, morphine IV as needed for pain.  Also received 1 dose of IV Dilaudid yesterday.  2.  GERD: On IV PPI  3.  Hypovolemic hyponatremia: Present on admission now resolved with hydration.  4.  Acute hypoxic respiratory failure: Currently on minimal O2 via nasal cannula.She does have underlying history of COPD and stage III squamous cell lung CA. she was started on high-dose steroids as outpatient and concern for pneumonitis on CT.  Taper as tolerated. Chest x-ray on January 20 reported a left midlung infiltrate possibly pneumonia.T-max is 99.  No  evidence of leukocytosis.on IV meropenem.  Incentive  spirometry.  5. Diastolic CHF, elevated troponin: Downtrending troponin.  Likely demand ischemia.  On Cardizem at baseline which is on hold due to n.p.o. status.  On Lasix as outpatient, currently on hold as patient n.p.o. and on mild IV hydration.  6.  AKI: Creatinine peaked to 1.5 during hospital course and now normalized.  Continue IV fluids until able to tolerate p.o. intake.  7.  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  Will start tapering and monitor blood pressure.    8. Acute on chronic thrombocytopenia:?  Secondary to problem #1 versus medication induced (chemotherapy, Bactrim, prednisone). Received platelet transfusion on January 21. Platelet count dropped to 19 on January 21 but picked up to 34 on 22nd.  Will repeat CBC today.  Seen by hematology and repeat DIC work-up sent.  .  No active signs of bleeding currently.  Monitor with daily CBC  9. COPD: Continue neb treatments/inhaler therapy.  On steroids at baseline.  DVT prophylaxis: SCDs given thrombocytopenia Code Status: Full code Family / Patient Communication: None present bedside Disposition Plan: May need skilled nursing facility.  PT evaluation when able to tolerate oral intake.     LOS: 4 days    Time spent: 45 minutes    Guilford Shi, MD [facility] Pager [phone]  If 7PM-7AM, please contact night-coverage [email or web address] Password [facility] 09/04/2018, 1:38 PM

## 2018-09-04 NOTE — Progress Notes (Signed)
Given report, will call and update son about transfer. Will move shortly. Bartholomew Crews, RN 09/04/2018 1:44 PM

## 2018-09-04 NOTE — Progress Notes (Addendum)
Patient ID: Suezanne Cheshire, female   DOB: Apr 08, 1946, 73 y.o.   MRN: [id number]    4 Days Post-Op  Subjective: Patient awake and states she has some abdominal pain but better today than yesterday.   She denies nausea or vomiting.  Objective: Vital signs in last 24 hours: Temp:  [97.7 F (36.5 C)-98.5 F (36.9 C)] 98.1 F (36.7 C) (01/23 0400) Pulse Rate:  [102-134] 121 (01/23 0700) Resp:  [15-31] 23 (01/23 0700) BP: (108-142)/(58-75) 136/61 (01/23 0700) SpO2:  [82 %-100 %] 97 % (01/23 0700) Last BM Date: 09/03/18  Intake/Output from previous day: 01/22 0701 - 01/23 0700 In: 1212.5 [I.V.:912.4; IV Piggyback:300.1] Out: 775 [Urine:725; Drains:40; Stool:10] Intake/Output this shift: No intake/output data recorded.  PE: Heart: tachy Lungs: diminished BS, on O2 Abd: soft, appropriately tender around incision and stoma, obese, hypoactive bowel sounds, stoma is pink and somewhat edematous; viable.  Minimal output in colostomy pouch.  Midline wound with VAC in place, does appear to have some leakage from ostomy to [facility] noted.  VAC and JP with darker old serosang output  Lab Results:  Recent Labs    09/02/18 0318 09/02/18 1203 09/03/18 0625  WBC 6.3  --  4.1  HGB 10.7*  --  8.5*  HCT 33.2*  --  26.5*  PLT 25* 19* 34*   BMET Recent Labs    09/03/18 0625 09/04/18 0435  NA 141 144  K 3.7 3.5  CL 111 111  CO2 24 24  GLUCOSE 134* 164*  BUN 27* 23  CREATININE 0.84 0.65  CALCIUM 7.4* 7.4*   PT/INR Recent Labs    09/02/18 1203  LABPROT 18.6*  INR 1.57   CMP     Component Value Date/Time   NA 144 09/04/2018 0435   K 3.5 09/04/2018 0435   CL 111 09/04/2018 0435   CO2 24 09/04/2018 0435   GLUCOSE 164 (H) 09/04/2018 0435   BUN 23 09/04/2018 0435   CREATININE 0.65 09/04/2018 0435   CREATININE 0.78 08/15/2017 0902   CALCIUM 7.4 (L) 09/04/2018 0435   PROT 6.1 (L) 08/25/2018 0732   ALBUMIN 3.3 (L) 08/19/2018 0732   AST 35 08/15/2018 0732   ALT 44 09/03/2018 0732     ALKPHOS 59 09/05/2018 0732   BILITOT 0.9 08/13/2018 0732   GFRNONAA >60 09/04/2018 0435   GFRNONAA 78 08/15/2017 0902   GFRAA >60 09/04/2018 0435   GFRAA 90 08/15/2017 0902   Lipase     Component Value Date/Time   LIPASE 22 08/24/2018 0732       Studies/Results: No results found.  Anti-infectives: Anti-infectives (From admission, onward)   Start     Dose/Rate Route Frequency Ordered Stop   08/24/2018 1100  meropenem (MERREM) 1 g in sodium chloride 0.9 % 100 mL IVPB     1 g 200 mL/hr over 30 Minutes Intravenous Every 8 hours 08/16/2018 1010         Assessment/Plan T2DM - A1c 8.5 HTN Severe COPD Lung cancer with pneumonitis on steroids - per medicine  Perforated diverticulitis of sigmoid colon S/P ex-lap, partial colectomy with end colostomy 09/09/2018 Dr. Kieth Brightly - POD#4 - cont NPO until better colostomy output, but will allow a few sips of clears from the floor. Expected postop ileus; certainly at risk for intraabd abscess - will plan repeat CT Sunday if not opening up - prn IV robaxin for pain control - JP and VAC with SS drainage, continue to monitor - WOC consult for new stoma, will  plan to change VAC on MWF schedule  Hypotension and tachycardia  -hypotension resolving, off levo -still tachy, defer to medicine Elevated troponins - ?demand ischemia, per medicine Thrombocytopenia - plts down-trending, down to 19K yesterday.  Defer to medicine and hematology. HIT panel negative  FEN: trial of ice chips/sips of clears from floor VTE: SCDs; chemical dvt on hold secondary to low plts; ok to restart but will defer to medicine/hematology ID: [email or web address] 1/19>>   LOS: 4 days   Sharon Mt. Dema Severin, M.D. [facility]

## 2018-09-05 DIAGNOSIS — D649 Anemia, unspecified: Secondary | ICD-10-CM

## 2018-09-05 LAB — CULTURE, BLOOD (ROUTINE X 2)
CULTURE: NO GROWTH
Culture: NO GROWTH
Special Requests: ADEQUATE

## 2018-09-05 LAB — CBC WITH DIFFERENTIAL/PLATELET
ABS IMMATURE GRANULOCYTES: 0.1 10*3/uL — AB (ref 0.00–0.07)
Band Neutrophils: 22 %
Basophils Absolute: 0 10*3/uL (ref 0.0–0.1)
Basophils Relative: 0 %
EOS ABS: 0 10*3/uL (ref 0.0–0.5)
Eosinophils Relative: 1 %
HCT: 25 % — ABNORMAL LOW (ref 36.0–46.0)
Hemoglobin: 7.8 g/dL — ABNORMAL LOW (ref 12.0–15.0)
Lymphocytes Relative: 13 %
Lymphs Abs: 0.4 10*3/uL — ABNORMAL LOW (ref 0.7–4.0)
MCH: 28.2 pg (ref 26.0–34.0)
MCHC: 31.2 g/dL (ref 30.0–36.0)
MCV: 90.3 fL (ref 80.0–100.0)
Metamyelocytes Relative: 3 %
Monocytes Absolute: 0.1 10*3/uL (ref 0.1–1.0)
Monocytes Relative: 2 %
Neutro Abs: 2.7 10*3/uL (ref 1.7–7.7)
Neutrophils Relative %: 59 %
Platelets: 17 10*3/uL — CL (ref 150–400)
RBC: 2.77 MIL/uL — ABNORMAL LOW (ref 3.87–5.11)
RDW: 18.9 % — ABNORMAL HIGH (ref 11.5–15.5)
WBC Morphology: INCREASED
WBC: 3.3 10*3/uL — ABNORMAL LOW (ref 4.0–10.5)
nRBC: 14.6 % — ABNORMAL HIGH (ref 0.0–0.2)
nRBC: 24 /100 WBC — ABNORMAL HIGH

## 2018-09-05 LAB — GLUCOSE, CAPILLARY
GLUCOSE-CAPILLARY: 217 mg/dL — AB (ref 70–99)
Glucose-Capillary: 205 mg/dL — ABNORMAL HIGH (ref 70–99)
Glucose-Capillary: 231 mg/dL — ABNORMAL HIGH (ref 70–99)
Glucose-Capillary: 248 mg/dL — ABNORMAL HIGH (ref 70–99)
Glucose-Capillary: 232 mg/dL — ABNORMAL HIGH (ref 70–99)

## 2018-09-05 LAB — DIC (DISSEMINATED INTRAVASCULAR COAGULATION)PANEL
D-Dimer, Quant: 10.71 ug/mL-FEU — ABNORMAL HIGH (ref 0.00–0.50)
Fibrinogen: >800 mg/dL — ABNORMAL HIGH (ref 210–475)
INR: 1.22
Platelets: 34 10*3/uL — ABNORMAL LOW (ref 150–400)
Prothrombin Time: 15.3 seconds — ABNORMAL HIGH (ref 11.4–15.2)
Smear Review: NONE SEEN
aPTT: 30 seconds (ref 24–36)

## 2018-09-05 MED ORDER — OMEPRAZOLE 20 MG PO CPDR
40.0000 mg | DELAYED_RELEASE_CAPSULE | Freq: Every day | ORAL | Status: DC
  Administered 2018-09-06 – 2018-09-10 (×4): 40 mg via ORAL
  Filled 2018-09-05 (×6): qty 2

## 2018-09-05 MED ORDER — SODIUM CHLORIDE 0.9% IV SOLUTION
Freq: Once | INTRAVENOUS | Status: AC
  Administered 2018-09-05: 16:00:00 via INTRAVENOUS

## 2018-09-05 NOTE — Progress Notes (Signed)
HEMATOLOGY-ONCOLOGY PROGRESS NOTE  SUBJECTIVE: Patient is resting. No evidence of bleeding to my exam.  OBJECTIVE: REVIEW OF SYSTEMS:  Not done  PHYSICAL EXAMINATION: ECOG PERFORMANCE STATUS: 4 - Bedbound  Vitals:   09/05/18 0902 09/05/18 1118  BP:  121/66  Pulse:  (!) 120  Resp:  20  Temp:  98.7 F (37.1 C)  SpO2: 100% 94%   Filed Weights   08/18/2018 1350 09/09/2018 2110  Weight: 218 lb 11.1 oz (99.2 kg) 234 lb 12.6 oz (106.5 kg)    GENERAL:alert, no distress and comfortable SKIN: No bleeding EYES: normal, Conjunctiva are pink and non-injected, sclera clear OROPHARYNX:no exudate, no erythema and lips, buccal mucosa, and tongue normal  NECK: supple, thyroid normal size, non-tender, without nodularity  LUNGS: clear to auscultation and percussion with normal breathing effort HEART: regular rate & rhythm and no murmurs and no lower extremity edema ABDOMEN:Surgical wound vac, colostomy  NEURO: not examied  LABORATORY DATA:  I have reviewed the data as listed CMP Latest Ref Rng & Units 09/04/2018 09/03/2018 09/02/2018  Glucose 70 - 99 mg/dL 164(H) 134(H) 113(H)  BUN 8 - 23 mg/dL 23 27(H) 32(H)  Creatinine 0.44 - 1.00 mg/dL 0.65 0.84 1.29(H)  Sodium 135 - 145 mmol/L 144 141 137  Potassium 3.5 - 5.1 mmol/L 3.5 3.7 4.5  Chloride 98 - 111 mmol/L 111 111 109  CO2 22 - 32 mmol/L 24 24 18(L)  Calcium 8.9 - 10.3 mg/dL 7.4(L) 7.4(L) 7.4(L)  Total Protein 6.5 - 8.1 g/dL - - -  Total Bilirubin 0.3 - 1.2 mg/dL - - -  Alkaline Phos 38 - 126 U/L - - -  AST 15 - 41 U/L - - -  ALT 0 - 44 U/L - - -    Lab Results  Component Value Date   WBC 3.3 (L) 09/05/2018   HGB 7.8 (L) 09/05/2018   HCT 25.0 (L) 09/05/2018   MCV 90.3 09/05/2018   PLT 17 (LL) 09/05/2018   NEUTROABS 2.7 09/05/2018    ASSESSMENT AND PLAN: 1. Severe Thrombocytopenia: Due to consumption. DIC panel re ordered Plan to give her platelets today again. Medications like Meropenam and Bactrim can also be contributing. 2.  Severe Anemia Hb 7.8 decreased from 8.5 Will follow

## 2018-09-05 NOTE — Progress Notes (Signed)
Inpatient Diabetes Program Recommendations  AACE/ADA: New Consensus Statement on Inpatient Glycemic Control (2015)  Target Ranges:  Prepandial:   less than 140 mg/dL      Peak postprandial:   less than 180 mg/dL (1-2 hours)      Critically ill patients:  140 - 180 mg/dL   Results for LAKINA, MCINTIRE (MRN [id number]) as of 09/05/2018 09:54  Ref. Range 09/04/2018 07:40 09/04/2018 12:12 09/04/2018 17:14 09/04/2018 21:16  Glucose-Capillary Latest Ref Range: 70 - 99 mg/dL 163 (H) 151 (H) 228 (H)  3 units NOVOLOG  228 (H)  2 units NOVOLOG    Results for DAYLAN, BOGGESS (MRN [id number]) as of 09/05/2018 09:54  Ref. Range 09/05/2018 08:30  Glucose-Capillary Latest Ref Range: 70 - 99 mg/dL 205 (H)  3 units NOVOLOG     Home DM Meds: Lantus 50 units QHS       Metformin 1000 mg BID  Current Orders: Novolog Sensitive Correction Scale/ SSI (0-9 units) TID AC + HS       Getting Solucortef 50 mg BID.  Patient takes Lantus at home.    MD- Please consider the following:  1. Start Lantus 12 units QHS (25% total home dose)  2. Increase Novolog SSI to Moderate scale (0-15 units) tid ac + hs     --Will follow patient during hospitalization--  Wyn Quaker RN, MSN, CDE Diabetes Coordinator Inpatient Glycemic Control Team Team Pager: [phone] (8a-5p)

## 2018-09-05 NOTE — Progress Notes (Signed)
Patient ID: [name], female   DOB: 1946-08-12, 73 y.o.   MRN: [id number]    5 Days Post-Op  Subjective: Patient sleeping.  She does awaken but doesn't really speak much.  States her pain is well controlled.  No nausea.  States she hasn't been up mobilizing at all.  Objective: Vital signs in last 24 hours: Temp:  [97.8 F (36.6 C)-99.4 F (37.4 C)] 97.8 F (36.6 C) (01/24 0721) Pulse Rate:  [102-135] 107 (01/24 0400) Resp:  [16-26] 18 (01/24 0400) BP: (107-147)/(45-87) 147/69 (01/24 0721) SpO2:  [95 %-100 %] 100 % (01/24 0902) Last BM Date: 09/04/18  Intake/Output from previous day: 01/23 0701 - 01/24 0700 In: 1387.3 [P.O.:655; I.V.:617.2; IV Piggyback:100.1] Out: 675 [Urine:675] Intake/Output this shift: Total I/O In: 554 [I.V.:354; IV Piggyback:200] Out: 0   PE: Abd: soft, obese, still with hypoactive BS, ostomy with no air currently and essentially no significant output.  Midline wound with VAC in place.  JP drain with serosang output  Lab Results:  Recent Labs    09/03/18 0625 09/05/18 0500  WBC 4.1 3.3*  HGB 8.5* 7.8*  HCT 26.5* 25.0*  PLT 34* 17*   BMET Recent Labs    09/03/18 0625 09/04/18 0435  NA 141 144  K 3.7 3.5  CL 111 111  CO2 24 24  GLUCOSE 134* 164*  BUN 27* 23  CREATININE 0.84 0.65  CALCIUM 7.4* 7.4*   PT/INR Recent Labs    09/02/18 1203  LABPROT 18.6*  INR 1.57   CMP     Component Value Date/Time   NA 144 09/04/2018 0435   K 3.5 09/04/2018 0435   CL 111 09/04/2018 0435   CO2 24 09/04/2018 0435   GLUCOSE 164 (H) 09/04/2018 0435   BUN 23 09/04/2018 0435   CREATININE 0.65 09/04/2018 0435   CREATININE 0.78 08/15/2017 0902   CALCIUM 7.4 (L) 09/04/2018 0435   PROT 6.1 (L) 08/16/2018 0732   ALBUMIN 3.3 (L) 09/04/2018 0732   AST 35 08/27/2018 0732   ALT 44 08/30/2018 0732   ALKPHOS 59 08/16/2018 0732   BILITOT 0.9 08/16/2018 0732   GFRNONAA >60 09/04/2018 0435   GFRNONAA 78 08/15/2017 0902   GFRAA >60 09/04/2018 0435     GFRAA 90 08/15/2017 0902   Lipase     Component Value Date/Time   LIPASE 22 09/05/2018 0732       Studies/Results: No results found.  Anti-infectives: Anti-infectives (From admission, onward)   Start     Dose/Rate Route Frequency Ordered Stop   08/25/2018 1100  meropenem (MERREM) 1 g in sodium chloride 0.9 % 100 mL IVPB     1 g 200 mL/hr over 30 Minutes Intravenous Every 8 hours 08/28/2018 1010         Assessment/Plan T2DM - A1c 8.5 HTN Severe COPD Lung cancer with pneumonitis on steroids - per medicine  Perforated diverticulitis of sigmoid colon POD 5,S/P ex-lap, partial colectomy with end colostomy 09/05/2018 Dr. Kieth Brightly - cont NPO until better colostomy output, but will allow a few sips of clears from the floor. Expected postop ileus; certainly at risk for intraabd abscess - will plan repeat CT Sunday if not opening up - prn IV robaxin for pain control - JP and VAC with SS drainage, continue to monitor - VAC change today -needs to mobilize and get out of bed  Hypotension and tachycardia -hypotension resolved -still tachy, defer to medicine Elevated troponins- ?demand ischemia, per medicine Thrombocytopenia- plts down-trending, down to 17K,  per medicine and heme  FEN: ice chips/sips of clears from floor VTE: SCDs;chemical dvt on hold secondary to low plts ID: meropenem 1/19>>   LOS: 5 days    Henreitta Cea , [facility] Surgery 09/05/2018, 9:10 AM Pager: [phone]

## 2018-09-05 NOTE — Consult Note (Signed)
Sharon Nurse wound follow up Wound type:Surgical.  [place] Nurse saw with [facility]CS PA on Wednesday. No change. Measurement: Per Wednesday's note. Wound TJL:LVDI, 50% pink, 50% adipose tissue Drainage (amount, consistency, odor) Moderate serous. Periwound: intact, defect in proximal third at 3 o'clock (toward ostomy), may be umbilicus Dressing procedure/placement/frequency: NPWT dressing change (routine, albeit staff report that they had some alarming for leakage through the night last night).  ONe piece of black foam removed from defect, wound cleansed with NS, and gently patted dry. Defect at presumed umbilicus filled with piece of skin barrier ring, also a piece of skin barrier ring is used to enhance seal at most distal portion of wound, 5-7 o'clock. Periwound skin is protected with strips of drape, one piece of black foam is used to obliterate the dead space and this is covered with drape.  Dressing is attached to suction at 148mmHg continuous and an immediate seal is achieved. The tubing is labeled with # and type of wound contact layers used today. Patient tolerated procedure well.  I was assisted in the lifting of the pendulous abdomen by a NT and her assistance was appreciated. Next dressing change is Monday, 09/08/2018. A medium black foam dressing is requested to the patient's room by her Bedside RN, Tamera.   [place] Nurse ostomy follow up Stoma type/location:  LMQ colostomy Stomal assessment/size: oval, budded, os at center, red, dry.  No output. 1 and 1/4 inch x 1 and 5/8 inch Peristomal assessment: intact, clear Treatment options for stomal/peristomal skin: soft convex pouch Output: none Ostomy pouching: 1pc.soft convex pouch  Education provided: None Enrolled patient in [employer] Discharge program: No  WOC nursing team will follow along with you, and will remain available to this patient, the nursing, surgical and medical teams.  Next visit is Monday, 09/08/2018. Thanks, Maudie Flakes, MSN, RN, [place], Arther Abbott  Pager# [phone]

## 2018-09-05 NOTE — Progress Notes (Signed)
Wound care completed by [place]; foam and ostomy changed.   Black foam ordered by Nurse.   Patient family is visiting.

## 2018-09-05 NOTE — Progress Notes (Signed)
Platelets 34. Paged [place] page to [facility]. Will continue to monitor patient and treat if needed.

## 2018-09-05 NOTE — Progress Notes (Signed)
PROGRESS NOTE    [name]  WVP:[id number]  DOB: Jan 23, 1946  DOA: 09/03/2018 PCP: Fayrene Helper, MD  Brief Narrative:   73 year old female with history of COPD, stage III squamous cell lung cancer who has been following Dr. Delton Coombes on chemotherapy as outpatient, chronic thrombocytopenia, anxiety/depression, diabetes mellitus, diastolic CHF on Lasix admitted on January 19 with abdominal pain/diarrhea.  CT abdomen pelvis showed perforated diverticulitis at the level of descending colon/sigmoid colon with air tracking up to left pelvis.  Patient seen by general surgery and underwent colectomy/colostomy. Chest x-ray reported mid left lung infiltrate: Atelectasis versus pneumonia.  Patient also treated with high-dose steroids during the hospital course.  Cortisol level however was 25.5 on admission.  (Patient on chronic prednisone 40 mg twice daily and Bactrim 3 times a week).  Hospital course complicated by acute on chronic thrombocytopenia.  She has received blood transfusions and plasma transfusion.  Seen by hematology and felt to have consumption thrombocytopenia.  Subjective: More awake and alert today.  Tolerating clear liquid diet.  Objective: Vitals:   09/05/18 1528 09/05/18 1540 09/05/18 1545 09/05/18 1600  BP: (!) 148/60 (!) 151/74  (!) 146/79  Pulse: 83 95 (!) 118 (!) 118  Resp: 17 18 14 14   Temp: 99 F (37.2 C) 98.8 F (37.1 C)  97.9 F (36.6 C)  TempSrc:  Axillary  Axillary  SpO2: 100% 100% 99% 99%  Weight:      Height:        Intake/Output Summary (Last 24 hours) at 09/05/2018 1626 Last data filed at 09/05/2018 1541 Gross per 24 hour  Intake 2033.33 ml  Output 700 ml  Net 1333.33 ml   Filed Weights   08/25/2018 1350 09/10/2018 2110  Weight: 99.2 kg 106.5 kg    Physical Examination:  General exam: Appears calm and comfortable  Respiratory system: Clear to auscultation. Respiratory effort normal. Cardiovascular system: S1 & S2 heard, RRR. No JVD,  murmurs, rubs, gallops or clicks. No pedal edema. Gastrointestinal system: Abdomen is distended, abdominal wound with wound VAC in place.  Colostomy bag in place along with JP drain with less than 10 mL in the drainage bag.  No organomegaly or masses felt. Normal bowel sounds heard. Central nervous system: Somnolent. No focal neurological deficits. Extremities: Symmetric 5 x 5 power. Skin: No rashes, lesions or ulcers Psychiatry: Could not assess but appears calm    Data Reviewed: I have personally reviewed following labs and imaging studies  CBC: Recent Labs  Lab 08/23/2018 0732  09/01/18 1312 09/01/18 1840 09/02/18 0318 09/02/18 1203 09/03/18 0625 09/05/18 0500  WBC 9.9   < > 4.0 4.1 6.3  --  4.1 3.3*  NEUTROABS 8.0*  --   --   --   --   --  2.4 2.7  HGB 13.0   < > 12.1 11.0* 10.7*  --  8.5* 7.8*  HCT 42.1   < > 36.9 34.3* 33.2*  --  26.5* 25.0*  MCV 87.7   < > 85.2 85.3 85.1  --  87.2 90.3  PLT 158   < > 32* 38* 25* 19* 34* 17*   < > = values in this interval not displayed.   Basic Metabolic Panel: Recent Labs  Lab 08/28/2018 2150 09/01/18 0048 09/01/18 0535 09/01/18 1840 09/02/18 0318 09/03/18 0625 09/04/18 0435  NA 127* 136 134* 136 137 141 144  K 5.1 4.8 4.5 4.6 4.5 3.7 3.5  CL 97* 108 107 109 109 111 111  CO2 17*  13* 16* 16* 18* 24 24  GLUCOSE 239* 112* 110* 102* 113* 134* 164*  BUN 18 18 20  29* 32* 27* 23  CREATININE 0.78 1.00 0.86 1.54* 1.29* 0.84 0.65  CALCIUM 7.3* 6.2* 6.9* 7.4* 7.4* 7.4* 7.4*  MG 1.7 1.8 2.2  --  2.1 2.2  --   PHOS 5.5* 5.9*  --   --  4.8*  --   --    GFR: Estimated Creatinine Clearance: 76.4 mL/min (by C-G formula based on SCr of 0.65 mg/dL). Liver Function Tests: Recent Labs  Lab 08/20/2018 0732  AST 35  ALT 44  ALKPHOS 59  BILITOT 0.9  PROT 6.1*  ALBUMIN 3.3*   Recent Labs  Lab 09/07/2018 0732  LIPASE 22   No results for input(s): AMMONIA in the last 168 hours. Coagulation Profile: Recent Labs  Lab 09/01/18 0048  09/02/18 1203  INR 1.70 1.57   Cardiac Enzymes: Recent Labs  Lab 09/01/18 0927 09/01/18 1840 09/01/18 2138 09/02/18 0318 09/02/18 0946  TROPONINI 0.07* 0.08* 0.06* 0.06* 0.05*   BNP (last 3 results) No results for input(s): PROBNP in the last 8760 hours. HbA1C: No results for input(s): HGBA1C in the last 72 hours. CBG: Recent Labs  Lab 09/04/18 1714 09/04/18 2116 09/05/18 0830 09/05/18 1121 09/05/18 1528  GLUCAP 228* 228* 205* 231* 248*   Lipid Profile: No results for input(s): CHOL, HDL, LDLCALC, TRIG, CHOLHDL, LDLDIRECT in the last 72 hours. Thyroid Function Tests: No results for input(s): TSH, T4TOTAL, FREET4, T3FREE, THYROIDAB in the last 72 hours. Anemia Panel: No results for input(s): VITAMINB12, FOLATE, FERRITIN, TIBC, IRON, RETICCTPCT in the last 72 hours. Sepsis Labs: Recent Labs  Lab 09/01/18 0535 09/01/18 0750 09/01/18 1820 09/01/18 2138  LATICACIDVEN 3.8* 3.9* 3.4* 2.4*    Recent Results (from the past 240 hour(s))  Stool culture     Status: None   Collection Time: 08/27/18 11:13 AM  Result Value Ref Range Status   Salmonella/Shigella Screen Final report  Final   Campylobacter Culture Final report  Final   E coli, Shiga toxin Assay Negative Negative Final    Comment: (NOTE) Performed At: [facility] [place], [place] [id number] Rush Farmer MD [id number]   C Difficile Quick Screen w PCR reflex     Status: None   Collection Time: 08/27/18 11:13 AM  Result Value Ref Range Status   C Diff antigen NEGATIVE NEGATIVE Final   C Diff toxin NEGATIVE NEGATIVE Final   C Diff interpretation No C. difficile detected.  Final    Comment: Performed at [facility], [address] [place], [place] [address]  STOOL CULTURE REFLEX - RSASHR     Status: None   Collection Time: 08/27/18 11:13 AM  Result Value Ref Range Status   Stool Culture result 1 (RSASHR) Comment  Final    Comment: (NOTE) No Salmonella or Shigella  recovered. Performed At: [facility] [address] [place], [place] [id number] Rush Farmer MD KW:[id number]   STOOL CULTURE Reflex - CMPCXR     Status: None   Collection Time: 08/27/18 11:13 AM  Result Value Ref Range Status   Stool Culture result 1 (CMPCXR) Comment  Final    Comment: (NOTE) No Campylobacter species isolated. Performed At: [facility] [place], [place] [id number] Rush Farmer MD QQ:[id number]   Urine culture     Status: Abnormal   Collection Time: 08/30/2018  8:00 AM  Result Value Ref Range Status   Specimen Description URINE, CLEAN  CATCH  Final   Special Requests   Final    NONE Performed at [facility], [address] [place], [place] [address]    Culture (A)  Final    50,000 COLONIES/mL MULTIPLE SPECIES PRESENT, SUGGEST RECOLLECTION   Report Status 09/01/2018 FINAL  Final  Culture, blood (routine x 2)     Status: None   Collection Time: 09/07/2018 10:05 PM  Result Value Ref Range Status   Specimen Description BLOOD RIGHT HAND  Final   Special Requests   Final    BOTTLES DRAWN AEROBIC ONLY Blood Culture adequate volume   Culture   Final    NO GROWTH 5 DAYS Performed at [facility], [place] [address], [place], [place] [address]    Report Status 09/05/2018 FINAL  Final  Culture, blood (routine x 2)     Status: None   Collection Time: 08/27/2018 10:05 PM  Result Value Ref Range Status   Specimen Description BLOOD LEFT HAND  Final   Special Requests   Final    BOTTLES DRAWN AEROBIC ONLY Blood Culture results may not be optimal due to an inadequate volume of blood received in culture bottles   Culture   Final    NO GROWTH 5 DAYS Performed at [facility], [place] [address], [place], [place] [address]    Report Status 09/05/2018 FINAL  Final      Radiology Studies: No results found.      Scheduled Meds: . arformoterol  15 mcg Nebulization BID  . budesonide (PULMICORT) nebulizer solution  0.5 mg  Nebulization BID  . chlorhexidine  15 mL Mouth Rinse BID  . hydrocortisone sod succinate (SOLU-CORTEF) inj  50 mg Intravenous Q12H  . insulin aspart  0-5 Units Subcutaneous QHS  . insulin aspart  0-9 Units Subcutaneous TID WC  . ipratropium-albuterol  3 mL Nebulization BID  . mouth rinse  15 mL Mouth Rinse q12n4p  . [START ON 09/06/2018] omeprazole  40 mg Oral Daily   Continuous Infusions: . sodium chloride Stopped (09/02/18 2307)  . lactated ringers 60 mL/hr at 09/05/18 0920  . methocarbamol (ROBAXIN) IV      Assessment & Plan:    1.  Perforated diverticulitis with sepsis present on admission: Lactate 6.4 on presentation now improving and downtrending.  Status post surgery/colostomy/wound VAC in place.  Started on clear liquid diet.  She had been n.p.o. and been only on IV fluids since admission.  Patient has been receiving electrolyte replacements for hypomagnesemia/hypocalcemia as needed.  Patient been on IV meropenem x6 days, morphine IV as needed for pain.  Afebrile.  Leukopenic on labs. D/c IV fluids  2.  Acute on chronic thrombocytopenia: patient appears to be pancytopenic. Secondary to problem #1 versus medication induced (chemotherapy, meropenem, Protonix, Bactrim as reviewed by pharmacy). Received platelet transfusion on January 21. Platelet count 19 ->34-> down to 19 again.  Seen by hematology and repeat DIC work-up sent.  Another round of platelet transfusion ordered.  She has not been on Bactrim while hospitalized.  Meropenem discontinued today.  Protonix changed to omeprazole..  No active signs of bleeding currently.  Monitor with daily CBC  3.  Hypovolemic hyponatremia: Present on admission now resolved with hydration.  4.  Acute hypoxic respiratory failure: Currently on 1 L O2 via nasal cannula.She does have underlying history of COPD and stage III squamous cell lung CA. she was started on high-dose steroids as outpatient and concern for pneumonitis on CT.  Taper as tolerated.  Chest x-ray on January 20 reported a left midlung infiltrate possibly pneumonia.T-max is 99.  No evidence of leukocytosis.she has been on IV meropenem.  Incentive spirometry.  5. Diastolic CHF, elevated troponin: Downtrending troponin.  Likely demand ischemia.  On Cardizem at baseline which is on hold due to n.p.o. status.  On Lasix as outpatient, currently on hold as patient n.p.o. and on mild IV hydration.  6.  AKI: Creatinine peaked to 1.5 during hospital course and now normalized.  Continue IV fluids until able to tolerate p.o. intake.  7.  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 On stress dose IV steroids while here. Will start tapering and monitor blood pressure.    8. GERD:  IV Protonix changed to oral omeprazole  9. COPD: Continue neb treatments/inhaler therapy.  On steroids at baseline.  DVT prophylaxis: SCDs given thrombocytopenia Code Status: Full code Family / Patient Communication: None present bedside Disposition Plan: May need skilled nursing facility.  PT evaluation when able to tolerate oral intake.    LOS: 5 days    Time spent: 45 minutes    Guilford Shi, MD [facility] Pager [phone]  If 7PM-7AM, please contact night-coverage [email or web address] Password [facility] 09/05/2018, 4:26 PM

## 2018-09-05 NOTE — Progress Notes (Signed)
Monitor noting apneic episodes. Assessed patient noted even unlabored respirations with 18 breaths per minute. No apneic episodes noted while with patient.  [name]

## 2018-09-05 NOTE — Progress Notes (Addendum)
MEDICATION RELATED CONSULT NOTE - INITIAL   Pharmacy Consult for thrombocytopenia  Patient Measurements: Height: 5\' 3"  (160 cm) Weight: 234 lb 12.6 oz (106.5 kg) IBW/kg (Calculated) : 52.4   Vital Signs: Temp: 97.8 F (36.6 C) (01/24 0721) Temp Source: Axillary (01/24 0721) BP: 147/69 (01/24 0721) Pulse Rate: 107 (01/24 0400) Intake/Output from previous day: 01/23 0701 - 01/24 0700 In: 1387.3 [P.O.:655; I.V.:617.2; IV Piggyback:100.1] Out: 675 [Urine:675] Intake/Output from this shift: Total I/O In: 554 [I.V.:354; IV Piggyback:200] Out: 0   Labs: Recent Labs    09/02/18 1203 09/03/18 0625 09/04/18 0435 09/05/18 0500  WBC  --  4.1  --  3.3*  HGB  --  8.5*  --  7.8*  HCT  --  26.5*  --  25.0*  PLT 19* 34*  --  17*  APTT 31  --   --   --   CREATININE  --  0.84 0.65  --   MG  --  2.2  --   --    Medical History: Past Medical History:  Diagnosis Date  . Anxiety   . Arthritis   . Bipolar disorder ([place])   . Chronic back pain   . Chronic dyspnea   . COPD (chronic obstructive pulmonary disease) ([place])   . Diverticulosis   . Family history of lung cancer   . Family history of prostate cancer   . Family history of stomach cancer   . 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 Frequent falls   . Gait instability   . GERD (gastroesophageal reflux disease) 2000  . Hyperlipidemia 1995  . Hypertension 1995  . Nicotine addiction   . Obesity     Assessment: Originally admitted for abdominal pain, CT scan of abdomen showed bowel perf which resulted in a partial colectomy and colostomy. She is also undergoing treatment as an outpatient for breast cancer.  She has had thrombocytopenia with unknown etiology. She received platelet transfusion on 1/20 + 1/21. She also received pRBC on 1/20. Plt 217 at admission > 98 > 38 > 19 > 34 >17. DIC and HIT have been ruled out. HIT Ab was negative. Unlikely that it was dilutional in nature. Heme/Onc  consulted they suggest consumptive etiology. Patient was septic pre-op but this has resolved.  Pharmacy consulted to rule out medication-induced causes of thrombocytopenia. At this time most other causes have been ruled out. Though uncommon, there are a couple cases studies showing meropenem-induced thrombocytopenia as well as pantoprazole-induced thrombocytopenia.  Goal of Therapy:  Recovery of platelets  Plan:  -Suggest changing protonix to prilosec, as patient was taking prilosec prior to admission -Will discuss antibiotic options with provider   Cartel Mauss, Jake Church 09/05/2018,9:30 AM

## 2018-09-05 NOTE — Progress Notes (Addendum)
CRITICAL VALUE ALERT  Critical Value: Platlets 17  Date & Time Notied:  09/05/2018 0539  Provider Notified: [place] at 5:44 and 6:03 and  6:34   Orders Received/Actions taken: return call, no new orders received. Noted will share results with dayshift coverage.

## 2018-09-05 NOTE — Progress Notes (Addendum)
Noted critical value platlets 17. jp and wound vac with minimal drainage in collection containers. No increased signs of bleeding noted from wounds. Awaiting return call or new orders from on call. Paged at 5:44am and 6:03am, and 6:34am. Will continue to monitor patient. Return call at 6:38am no new orders at this time, provider will share results to day shift coverage.   Denver Faster, BSN

## 2018-09-05 NOTE — Evaluation (Signed)
Occupational Therapy Evaluation Patient Details Name: [name] MRN: [id number] DOB: 05-22-1946 Today's Date: 09/05/2018    History of Present Illness [name] is a 73 y.o. 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  Evaluation in the emergency department including a CT of the abdomen pelvis showed a bowel perforation at the level of the descending colon and sigmoid colon with air tracking up to the left pelvis with surrounding mesenteric stranding. Pt is now s/p  exploratory laparotomy, partial colectomy with end colostomy on 08/30/2018. Hospital course complicated by acute on chronic thrombocytopenia.    Clinical Impression   This 73 y/o female presents with the above. Family present during session and assisting to provide PLOF. Per family pt was independent with ADL and functional mobility (household distances) PTA. Pt presenting with significant weakness, cognitive impairments, increased pain levels. Pt requiring maxA+2 for bed mobility during session, assisting NT with bathing and changing of bed linens during session completion. Pt currently requires max-totalA for all aspects of UB/LB ADL. Per family pt is typically able to communicate without difficulty, though pt mostly mumbling during this session and often only responding with "yes mam" when asked a question. Pt requires multimodal cues to follow simple commands during session completion. Did not attempt sitting EOB as pt significantly fatigued and with increased pain after bed level activity. Suspect may require use of hoyer/maximove initially for OOB. She will benefit from continued acute OT services and recommend follow up therapy services in SNF setting after discharge to maximize her safety and independence with ADL and mobility. Will follow.     Follow Up  Recommendations  SNF;Supervision/Assistance - 24 hour    Equipment Recommendations  Other (comment)(TBD in next venue)           Precautions / Restrictions Precautions Precautions: Fall Precaution Comments: jp drain, wound vac, colostomy  Restrictions Weight Bearing Restrictions: No      Mobility Bed Mobility Overal bed mobility: Needs Assistance Bed Mobility: Rolling Rolling: Max assist;+2 for physical assistance;+2 for safety/equipment         General bed mobility comments: assist to roll to L and R, +3 assist present and provided for increased safety and line management, cues for LE placement and use of UEs to self-assist  Transfers                 General transfer comment: unable, pt with increased pain and fatigue after bed level activity    Balance                                           ADL either performed or assessed with clinical judgement   ADL Overall ADL's : Needs assistance/impaired                                     Functional mobility during ADLs: Maximal assistance;+2 for safety/equipment;+2 for physical assistance(bed mobility only) General ADL Comments: pt currently requires max-totalA for all aspects of ADL, assisted NT with bathing task at bed level, changing of bed linens and donning new gown; limited due to weakness and pain     Vision  Perception     Praxis      Pertinent Vitals/Pain Pain Assessment: Faces Faces Pain Scale: Hurts whole lot Pain Location: abdomen Pain Descriptors / Indicators: Grimacing;Guarding;Moaning Pain Intervention(s): Monitored during session;Limited activity within patient's tolerance;Repositioned     Hand Dominance     Extremity/Trunk Assessment Upper Extremity Assessment Upper Extremity Assessment: Generalized weakness;RUE deficits/detail;LUE deficits/detail;Difficult to assess due to impaired cognition RUE Deficits / Details: grossly 2/5 throughout,  difficult to fully assess as pt not fully following commands; pt able to use RUE hold onto bedrail while laying in sidelying during changing of bed linens; noted slight bruising/edema to R hand LUE Deficits / Details: grossly 2/5 throughout, difficult to fully assess as pt not fully following commands   Lower Extremity Assessment Lower Extremity Assessment: Defer to PT evaluation       Communication Communication Communication: (pt often only mumbling)   Cognition Arousal/Alertness: Lethargic;Awake/alert Behavior During Therapy: Flat affect Overall Cognitive Status: Impaired/Different from baseline Area of Impairment: Following commands;Awareness                       Following Commands: Follows one step commands inconsistently;Follows one step commands with increased time   Awareness: Intellectual   General Comments: pt often only mumbling and often responding "yes mam" to questions; able to tell me her DOB and name but otherwise not communicating much; moaning in response to pain   General Comments       Exercises     Shoulder Instructions      Home Living Family/patient expects to be discharged to:: Private residence Living Arrangements: Spouse/significant other Available Help at Discharge: Family Type of Home: House Home Access: Stairs to enter Technical brewer of Steps: 4 Entrance Stairs-Rails: Right Home Layout: One level                          Prior Functioning/Environment Level of Independence: Needs assistance  Gait / Transfers Assistance Needed: household ambulator without AD ADL's / Homemaking Assistance Needed: family reports pt was completing ADL without assist Communication / Swallowing Assistance Needed: pt typically communicates without difficulty           OT Problem List: Decreased strength;Decreased range of motion;Decreased activity tolerance;Pain;Obesity;Impaired balance (sitting and/or standing);Decreased cognition       OT Treatment/Interventions: Self-care/ADL training;Therapeutic exercise;Therapeutic activities;Patient/family education;Balance training;Cognitive remediation/compensation;Energy conservation;DME and/or AE instruction    OT Goals(Current goals can be found in the care plan section) Acute Rehab OT Goals Patient Stated Goal: less pain OT Goal Formulation: With patient Time For Goal Achievement: 09/19/18 Potential to Achieve Goals: Good  OT Frequency: Min 2X/week   Barriers to D/C:            Co-evaluation              AM-PAC OT "6 Clicks" Daily Activity     Outcome Measure Help from another person eating meals?: A Lot Help from another person taking care of personal grooming?: A Lot Help from another person toileting, which includes using toliet, bedpan, or urinal?: Total Help from another person bathing (including washing, rinsing, drying)?: Total Help from another person to put on and taking off regular upper body clothing?: Total Help from another person to put on and taking off regular lower body clothing?: Total 6 Click Score: 8   End of Session Equipment Utilized During Treatment: Oxygen Nurse Communication: Mobility status  Activity Tolerance: Patient limited by fatigue;Patient limited by pain Patient  left: in bed;with call bell/phone within reach;with bed alarm set;with family/visitor present  OT Visit Diagnosis: Muscle weakness (generalized) (M62.81);Pain Pain - part of body: (abdomen)                Time: 1791-5056 OT Time Calculation (min): 20 min Charges:  OT General Charges $OT Visit: 1 Visit OT Evaluation $OT Eval Moderate Complexity: 1 Mod  Lou Cal, OT [employer] Pager [phone] Office [phone]  Raymondo Band 09/05/2018, 3:10 PM

## 2018-09-05 NOTE — Care Management Important Message (Signed)
Important Message  Patient Details  Name: [name] MRN: [id number] Date of Birth: [birth year]   Medicare Important Message Given:  Yes    Norvella Loscalzo P [place] 09/05/2018, 11:20 AM

## 2018-09-05 NOTE — Progress Notes (Signed)
Nurse back from transferring another patient, was met by Rennie Natter, who states that "a Physician was looking for me". Wanted to know if patient had any symptoms of blood loss.   Patient has no n/v, bowel movement (with blood), if anything her output has been minimal; wound vac, ostomy, and drain.  Upon reassessment patient is pallor in color but resting peacefully.

## 2018-09-06 ENCOUNTER — Inpatient Hospital Stay: Payer: Self-pay

## 2018-09-06 ENCOUNTER — Inpatient Hospital Stay (HOSPITAL_COMMUNITY): Payer: PPO

## 2018-09-06 LAB — CBC WITH DIFFERENTIAL/PLATELET
BASOS ABS: 0 10*3/uL (ref 0.0–0.1)
Basophils Relative: 1 %
Eosinophils Absolute: 0 10*3/uL (ref 0.0–0.5)
Eosinophils Relative: 0 %
HCT: 25.5 % — ABNORMAL LOW (ref 36.0–46.0)
Hemoglobin: 7.9 g/dL — ABNORMAL LOW (ref 12.0–15.0)
Lymphocytes Relative: 11 %
Lymphs Abs: 0.4 10*3/uL — ABNORMAL LOW (ref 0.7–4.0)
MCH: 28.3 pg (ref 26.0–34.0)
MCHC: 31 g/dL (ref 30.0–36.0)
MCV: 91.4 fL (ref 80.0–100.0)
Monocytes Absolute: 0.1 10*3/uL (ref 0.1–1.0)
Monocytes Relative: 2 %
NEUTROS PCT: 86 %
NRBC: 13.9 % — AB (ref 0.0–0.2)
Neutro Abs: 3.4 10*3/uL (ref 1.7–7.7)
Platelets: 30 10*3/uL — ABNORMAL LOW (ref 150–400)
RBC: 2.79 MIL/uL — AB (ref 3.87–5.11)
RDW: 18 % — ABNORMAL HIGH (ref 11.5–15.5)
WBC: 4 10*3/uL (ref 4.0–10.5)

## 2018-09-06 LAB — PHOSPHORUS: Phosphorus: 2.3 mg/dL — ABNORMAL LOW (ref 2.5–4.6)

## 2018-09-06 LAB — BASIC METABOLIC PANEL
Anion gap: 8 (ref 5–15)
BUN: 21 mg/dL (ref 8–23)
CO2: 26 mmol/L (ref 22–32)
Calcium: 7.7 mg/dL — ABNORMAL LOW (ref 8.9–10.3)
Chloride: 112 mmol/L — ABNORMAL HIGH (ref 98–111)
Creatinine, Ser: 0.58 mg/dL (ref 0.44–1.00)
GFR calc Af Amer: >60 mL/min (ref >60)
GFR calc non Af Amer: >60 mL/min (ref >60)
Glucose, Bld: 298 mg/dL — ABNORMAL HIGH (ref 70–99)
Potassium: 3.6 mmol/L (ref 3.5–5.1)
Sodium: 146 mmol/L — ABNORMAL HIGH (ref 135–145)

## 2018-09-06 LAB — PREPARE PLATELET PHERESIS: Unit division: 0

## 2018-09-06 LAB — BPAM PLATELET PHERESIS
Blood Product Expiration Date: 202001252359
ISSUE DATE / TIME: 202001241532
Unit Type and Rh: 6200

## 2018-09-06 LAB — MAGNESIUM: Magnesium: 1.6 mg/dL — ABNORMAL LOW (ref 1.7–2.4)

## 2018-09-06 LAB — GLUCOSE, CAPILLARY
GLUCOSE-CAPILLARY: 214 mg/dL — AB (ref 70–99)
GLUCOSE-CAPILLARY: 259 mg/dL — AB (ref 70–99)
Glucose-Capillary: 214 mg/dL — ABNORMAL HIGH (ref 70–99)
Glucose-Capillary: 247 mg/dL — ABNORMAL HIGH (ref 70–99)

## 2018-09-06 MED ORDER — METOPROLOL TARTRATE 25 MG PO TABS
25.0000 mg | ORAL_TABLET | Freq: Two times a day (BID) | ORAL | Status: DC
  Administered 2018-09-06: 25 mg via ORAL
  Filled 2018-09-06 (×2): qty 1

## 2018-09-06 MED ORDER — IOHEXOL 300 MG/ML  SOLN
125.0000 mL | Freq: Once | INTRAMUSCULAR | Status: AC | PRN
  Administered 2018-09-06: 125 mL via INTRAVENOUS

## 2018-09-06 MED ORDER — TRAVASOL 10 % IV SOLN
INTRAVENOUS | Status: AC
  Administered 2018-09-06: 18:00:00 via INTRAVENOUS
  Filled 2018-09-06: qty 428.4

## 2018-09-06 MED ORDER — SODIUM CHLORIDE 0.9% FLUSH: 10.0000 mL | INTRAVENOUS | Status: DC | PRN

## 2018-09-06 MED ORDER — LACTATED RINGERS IV SOLN
INTRAVENOUS | Status: AC
  Administered 2018-09-06 – 2018-09-08 (×3): via INTRAVENOUS

## 2018-09-06 MED ORDER — SODIUM CHLORIDE 0.9% FLUSH
10.0000 mL | Freq: Two times a day (BID) | INTRAVENOUS | Status: DC
  Administered 2018-09-06 – 2018-09-10 (×8): 10 mL

## 2018-09-06 MED ORDER — POTASSIUM PHOSPHATES 15 MMOLE/5ML IV SOLN
15.0000 mmol | Freq: Once | INTRAVENOUS | Status: AC
  Administered 2018-09-06: 15 mmol via INTRAVENOUS
  Filled 2018-09-06: qty 5

## 2018-09-06 MED ORDER — INSULIN ASPART 100 UNIT/ML ~~LOC~~ SOLN
0.0000 [IU] | SUBCUTANEOUS | Status: DC
  Administered 2018-09-06 (×2): 5 [IU] via SUBCUTANEOUS
  Administered 2018-09-06: 8 [IU] via SUBCUTANEOUS
  Administered 2018-09-07: 3 [IU] via SUBCUTANEOUS
  Administered 2018-09-07: 8 [IU] via SUBCUTANEOUS
  Administered 2018-09-07: 3 [IU] via SUBCUTANEOUS
  Administered 2018-09-07 – 2018-09-08 (×5): 5 [IU] via SUBCUTANEOUS
  Administered 2018-09-08 (×3): 2 [IU] via SUBCUTANEOUS
  Administered 2018-09-09: 8 [IU] via SUBCUTANEOUS
  Administered 2018-09-09 (×2): 5 [IU] via SUBCUTANEOUS
  Administered 2018-09-09: 8 [IU] via SUBCUTANEOUS
  Administered 2018-09-09: 5 [IU] via SUBCUTANEOUS
  Administered 2018-09-09: 8 [IU] via SUBCUTANEOUS
  Administered 2018-09-10: 5 [IU] via SUBCUTANEOUS
  Administered 2018-09-10 (×3): 3 [IU] via SUBCUTANEOUS
  Administered 2018-09-10 (×2): 5 [IU] via SUBCUTANEOUS

## 2018-09-06 MED ORDER — CALCIUM GLUCONATE-NACL 1-0.675 GM/50ML-% IV SOLN
1.0000 g | Freq: Once | INTRAVENOUS | Status: AC
  Administered 2018-09-06: 1000 mg via INTRAVENOUS
  Filled 2018-09-06: qty 50

## 2018-09-06 MED ORDER — TRAVASOL 10 % IV SOLN
INTRAVENOUS | Status: DC
  Filled 2018-09-06: qty 428.4

## 2018-09-06 MED ORDER — MAGNESIUM SULFATE 2 GM/50ML IV SOLN
2.0000 g | Freq: Once | INTRAVENOUS | Status: AC
  Administered 2018-09-06: 2 g via INTRAVENOUS
  Filled 2018-09-06: qty 50

## 2018-09-06 NOTE — Progress Notes (Signed)
Potassium phosphate needs a 0.22 micro-in-line filter in order to administer.   Nurse ordered from Pharmacist, community.   Informed 3rd shift.

## 2018-09-06 NOTE — Progress Notes (Signed)
Peripherally Inserted Central Catheter/Midline Placement  The IV Nurse has discussed with the patient and/or persons authorized to consent for the patient, the purpose of this procedure and the potential benefits and risks involved with this procedure.  The benefits include less needle sticks, lab draws from the catheter, and the patient may be discharged home with the catheter. Risks include, but not limited to, infection, bleeding, blood clot (thrombus formation), and puncture of an artery; nerve damage and irregular heartbeat and possibility to perform a PICC exchange if needed/ordered by physician.  Alternatives to this procedure were also discussed.  Bard Power PICC patient education guide, fact sheet on infection prevention and patient information card has been provided to patient /or left at bedside.  Son at bedside gave consent due to pt disorientation.  PICC/Midline Placement Documentation  PICC Single Lumen 09/06/18 PICC Right Brachial 43 cm 1 cm (Active)  Indication for Insertion or Continuance of Line Administration of hyperosmolar/irritating solutions (i.e. TPN, Vancomycin, etc.) 09/06/2018  6:00 PM  Exposed Catheter (cm) 1 cm 09/06/2018  6:00 PM  Site Assessment Clean;Dry;Intact 09/06/2018  6:00 PM  Line Status Flushed;Saline locked;Blood return noted 09/06/2018  6:00 PM  Dressing Type Transparent 09/06/2018  6:00 PM  Dressing Status Clean;Dry;Intact;Antimicrobial disc in place 09/06/2018  6:00 PM  Line Care Connections checked and tightened 09/06/2018  6:00 PM  Line Adjustment (NICU/IV Team Only) No 09/06/2018  6:00 PM  Dressing Intervention New dressing 09/06/2018  6:00 PM  Dressing Change Due 09/13/18 09/06/2018  6:00 PM       Rolena Infante 09/06/2018, 6:13 PM

## 2018-09-06 NOTE — Progress Notes (Addendum)
PROGRESS NOTE    [name]  ZOX:[id number]  DOB: 09/30/1945  DOA: 08/29/2018 PCP: Fayrene Helper, MD  Brief Narrative:   73 year old female with history of COPD, stage III squamous cell lung cancer who has been following Dr. Delton Coombes on chemotherapy as outpatient, chronic thrombocytopenia, anxiety/depression, diabetes mellitus, diastolic CHF on Lasix admitted on January 19 with abdominal pain/diarrhea.  CT abdomen pelvis showed perforated diverticulitis at the level of descending colon/sigmoid colon with air tracking up to left pelvis.  Patient seen by general surgery and underwent colectomy/colostomy. Chest x-ray reported mid left lung infiltrate: Atelectasis versus pneumonia.  Patient also treated with high-dose steroids during the hospital course.  Cortisol level however was 25.5 on admission.  (Patient on chronic prednisone 40 mg twice daily and Bactrim 3 times a week).  Hospital course complicated by acute on chronic thrombocytopenia.  She has received blood transfusions and plasma transfusion.  Seen by hematology and felt to have consumption thrombocytopenia.  Subjective: Patient somnolent.  Still tachycardic with heart rate in the 120s.  She received 2 mg of IV morphine at around noon.  Tolerating clear liquid diet.  Seen by general surgery and concern for low ostomy output.  Plan for PICC line today  Objective: Vitals:   09/06/18 0844 09/06/18 1155 09/06/18 1209 09/06/18 1537  BP:   (!) 152/74 (!) 152/73  Pulse: (!) 121 (!) 115 (!) 119 (!) 116  Resp: (!) 21 20 (!) 22 (!) 24  Temp:   98.6 F (37 C) 98.9 F (37.2 C)  TempSrc:   Oral Oral  SpO2: 98% 97% (!) 88% 97%  Weight:      Height:        Intake/Output Summary (Last 24 hours) at 09/06/2018 1809 Last data filed at 09/06/2018 0500 Gross per 24 hour  Intake -  Output 700 ml  Net -700 ml   Filed Weights   09/02/2018 1350 09/02/2018 2110  Weight: 99.2 kg 106.5 kg    Physical Examination:  General exam:  Appears somnolent but arousable with verbal and painful stimuli.  Answers some questions by nodding.  Son bedside. Respiratory system: Clear to auscultation. Respiratory effort normal. Cardiovascular system: S1 & S2 heard, RRR. No JVD, murmurs, rubs, gallops or clicks. No pedal edema. Gastrointestinal system: Abdomen is obese with?  Tenderness (patient grimacing), abdominal wound with wound VAC in place. Colostomy bag in place along with JP drain.   No organomegaly or masses felt. Normal bowel sounds heard. Central nervous system: Somnolent. No focal neurological deficits. Extremities: Symmetric 5 x 5 power. Skin: No rashes, lesions or ulcers Psychiatry: Could not assess but appears calm      Data Reviewed: I have personally reviewed following labs and imaging studies  CBC: Recent Labs  Lab 08/20/2018 0732  09/01/18 1840 09/02/18 0318 09/02/18 1203 09/03/18 0625 09/05/18 0500 09/05/18 1930 09/06/18 0512  WBC 9.9   < > 4.1 6.3  --  4.1 3.3*  --  4.0  NEUTROABS 8.0*  --   --   --   --  2.4 2.7  --  3.4  HGB 13.0   < > 11.0* 10.7*  --  8.5* 7.8*  --  7.9*  HCT 42.1   < > 34.3* 33.2*  --  26.5* 25.0*  --  25.5*  MCV 87.7   < > 85.3 85.1  --  87.2 90.3  --  91.4  PLT 158   < > 38* 25* 19* 34* 17* 34* 30*   < > =  values in this interval not displayed.   Basic Metabolic Panel: Recent Labs  Lab 08/22/2018 2150 09/01/18 0048 09/01/18 0535 09/01/18 1840 09/02/18 0318 09/03/18 0625 09/04/18 0435 09/06/18 1120  NA 127* 136 134* 136 137 141 144 146*  K 5.1 4.8 4.5 4.6 4.5 3.7 3.5 3.6  CL 97* 108 107 109 109 111 111 112*  CO2 17* 13* 16* 16* 18* 24 24 26   GLUCOSE 239* 112* 110* 102* 113* 134* 164* 298*  BUN 18 18 20  29* 32* 27* 23 21  CREATININE 0.78 1.00 0.86 1.54* 1.29* 0.84 0.65 0.58  CALCIUM 7.3* 6.2* 6.9* 7.4* 7.4* 7.4* 7.4* 7.7*  MG 1.7 1.8 2.2  --  2.1 2.2  --  1.6*  PHOS 5.5* 5.9*  --   --  4.8*  --   --  2.3*   GFR: Estimated Creatinine Clearance: 76.4 mL/min (by C-G  formula based on SCr of 0.58 mg/dL). Liver Function Tests: Recent Labs  Lab 09/08/2018 0732  AST 35  ALT 44  ALKPHOS 59  BILITOT 0.9  PROT 6.1*  ALBUMIN 3.3*   Recent Labs  Lab 08/30/2018 0732  LIPASE 22   No results for input(s): AMMONIA in the last 168 hours. Coagulation Profile: Recent Labs  Lab 09/01/18 0048 09/02/18 1203 09/05/18 1930  INR 1.70 1.57 1.22   Cardiac Enzymes: Recent Labs  Lab 09/01/18 0927 09/01/18 1840 09/01/18 2138 09/02/18 0318 09/02/18 0946  TROPONINI 0.07* 0.08* 0.06* 0.06* 0.05*   BNP (last 3 results) No results for input(s): PROBNP in the last 8760 hours. HbA1C: No results for input(s): HGBA1C in the last 72 hours. CBG: Recent Labs  Lab 09/05/18 2001 09/05/18 2342 09/06/18 0349 09/06/18 0733 09/06/18 1117  GLUCAP 232* 217* 214* 214* 259*   Lipid Profile: No results for input(s): CHOL, HDL, LDLCALC, TRIG, CHOLHDL, LDLDIRECT in the last 72 hours. Thyroid Function Tests: No results for input(s): TSH, T4TOTAL, FREET4, T3FREE, THYROIDAB in the last 72 hours. Anemia Panel: No results for input(s): VITAMINB12, FOLATE, FERRITIN, TIBC, IRON, RETICCTPCT in the last 72 hours. Sepsis Labs: Recent Labs  Lab 09/01/18 0535 09/01/18 0750 09/01/18 1820 09/01/18 2138  LATICACIDVEN 3.8* 3.9* 3.4* 2.4*    Recent Results (from the past 240 hour(s))  Urine culture     Status: Abnormal   Collection Time: 08/24/2018  8:00 AM  Result Value Ref Range Status   Specimen Description URINE, CLEAN CATCH  Final   Special Requests   Final    NONE Performed at [facility], [address] [place], [place] [address]    Culture (A)  Final    50,000 COLONIES/mL MULTIPLE SPECIES PRESENT, SUGGEST RECOLLECTION   Report Status 09/01/2018 FINAL  Final  Culture, blood (routine x 2)     Status: None   Collection Time: 08/19/2018 10:05 PM  Result Value Ref Range Status   Specimen Description BLOOD RIGHT HAND  Final   Special Requests   Final    BOTTLES DRAWN  AEROBIC ONLY Blood Culture adequate volume   Culture   Final    NO GROWTH 5 DAYS Performed at [facility], [place] [address], [place], [place] [address]    Report Status 09/05/2018 FINAL  Final  Culture, blood (routine x 2)     Status: None   Collection Time: 08/21/2018 10:05 PM  Result Value Ref Range Status   Specimen Description BLOOD LEFT HAND  Final   Special Requests   Final    BOTTLES DRAWN AEROBIC ONLY  Blood Culture results may not be optimal due to an inadequate volume of blood received in culture bottles   Culture   Final    NO GROWTH 5 DAYS Performed at [facility] [address], [place],  [address]    Report Status 09/05/2018 FINAL  Final      Radiology Studies: [place] Ekg Site Rite  Result Date: 09/06/2018 If Site Rite image not attached, placement could not be confirmed due to current cardiac rhythm.       Scheduled Meds: . arformoterol  15 mcg Nebulization BID  . budesonide (PULMICORT) nebulizer solution  0.5 mg Nebulization BID  . chlorhexidine  15 mL Mouth Rinse BID  . hydrocortisone sod succinate (SOLU-CORTEF) inj  50 mg Intravenous Q12H  . insulin aspart  0-15 Units Subcutaneous Q4H  . ipratropium-albuterol  3 mL Nebulization BID  . mouth rinse  15 mL Mouth Rinse q12n4p  . metoprolol tartrate  25 mg Oral BID  . omeprazole  40 mg Oral Daily   Continuous Infusions: . sodium chloride Stopped (09/02/18 2307)  . calcium gluconate    . lactated ringers    . methocarbamol (ROBAXIN) IV    . potassium PHOSPHATE IVPB (in mmol)    . TPN ADULT (ION) 30 mL/hr at 09/06/18 1732    Assessment & Plan:    1.  Perforated diverticulitis with sepsis present on admission: Afebrile, no leukocytosis.  Lactate 6.4 on presentation, now improving and downtrending.  Status post surgery/colostomy/wound VAC in place.  General surgery concerned about low ostomy output.  N.p.o. for now and plan to start TPN tonight once PICC line access available.  Meropenem  discontinued yesterday.  Discussed with general surgery who were concerned about possible intra-abdominal infection, will order CT abdomen pelvis with IV contrast only today. If concern for infection will treat with Rocephin/Flagyl and avoid meropenem and concern for thrombocytopenia.  2.  Altered mental status: Metabolic encephalopathy secondary to infection versus sedation from IV pain medications.  Given severe thrombocytopenia, will obtain CT head to rule out spontaneous bleed.  Discussed with general surgery and ordered CT abdomen/pelvis as described above.  3.   Acute on chronic thrombocytopenia: patient appears to be pancytopenic. Secondary to problem #1 versus medication induced (chemotherapy, meropenem, Protonix, Bactrim as reviewed by pharmacy). Received platelet transfusion on January 21.  Being followed by hematology, DIC work-up sent.  Another round of platelet transfusion ordered 1/24 .  Platelet count 19 ->34-> 19-->30.  She has not been on Bactrim while hospitalized.  Meropenem discontinued 1/24.  Protonix changed to omeprazole..  No active signs of bleeding currently.  Monitor with daily CBC  4.  Acute hypoxic respiratory failure: Currently on 1 L O2 via nasal cannula.She does have underlying history of COPD and stage III squamous cell lung CA. she was started on high-dose steroids (prednisone 40 mg twice daily) as outpatient and concern for pneumonitis on CT. she was started  on stress dose steroids during ICU stay.  Tapering slowly as tolerated.  May need to taper to off rapidly if CT abdomen does show infection.  Chest x-ray on January 20 reported a left midlung infiltrate possibly pneumonia.T-max is 99.  No evidence of leukocytosis.status post 6 days IV meropenem.  Incentive spirometry as tolerated.  5. Diastolic CHF, elevated troponin: Downtrending troponin.  Likely demand ischemia.  On Cardizem at baseline which is on hold due to n.p.o. status.  Given persistent tachycardia added  metoprolol today.  Underlying cause could be pain/infection.  On Lasix as outpatient, currently on hold as patient n.p.o.  6.  AKI: Creatinine peaked to 1.5 during hospital course and now normalized.  Normalized with IV fluids  7.  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 On stress dose IV steroids while here. Will start tapering and monitor blood pressure.    8. GERD:  IV Protonix changed to oral omeprazole  9. COPD: Continue neb treatments/inhaler therapy.   10. Hypovolemic hyponatremia: Present on admission now resolved with hydration.   DVT prophylaxis: SCDs given thrombocytopenia Code Status: Full code Family / Patient Communication: Discussed with son who was present bedside.  He understands overall poor prognosis. Disposition Plan: May need skilled nursing facility.  PT evaluation when able to tolerate oral intake.  Palliative care consult for care goal discussions.    LOS: 6 days    Time spent: 45 minutes. Endorsed to on Call APP to f/u on CT scan, vascular access and vitals later tonight.    Guilford Shi, MD [facility] Pager [phone]  If 7PM-7AM, please contact night-coverage [email or web address] Password [facility] 09/06/2018, 6:09 PM

## 2018-09-06 NOTE — Progress Notes (Signed)
Order placed for PICC.  IV team came for consent and family had left.   Nurse called spouse with no answer and no voice mail option.   Nurse then called son, Coralyn Mark who is on the way back to the hospital for the consent.

## 2018-09-06 NOTE — Progress Notes (Signed)
Initial Nutrition Assessment  DOCUMENTATION CODES:   Morbid obesity  INTERVENTION:   -TPN management per pharmacy -RD will follow for diet advancement and supplement diet as appropriate  NUTRITION DIAGNOSIS:   Increased nutrient needs related to wound healing, post-op healing as evidenced by estimated needs.  GOAL:   Patient will meet greater than or equal to 90% of their needs  MONITOR:   Diet advancement, Labs, Weight trends, Skin, I & O's, PO intake  REASON FOR ASSESSMENT:   Consult New TPN/TNA  ASSESSMENT:   [name] is a 73 y.o. 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 CT of the abdomen pelvis showed a bowel perforation at the level of the descending colon and sigmoid colon with air tracking up to the left pelvis with surrounding mesenteric stranding.  This was thought to be possibly due to ruptured diverticulitis.  Pt admitted with diverticulitis with perforation.   1/19- s/p exploratory laparotomy, partial colectomy with end colostomy, splenic mobilization, lysis of adhesions, placement of 100cm^2 vacuum dressing  Reviewed I/O's: +549 ml x 24 hours and +8.1 L since admission  Per MD notes, plan for NPO/ sips and chips until colostomy ouput has improved. MD reports pt is at high risk for ileus or intrabdominal abscess.  Pt very drowsy at time of visit. She responded to some close ended questions; she was able to verbalize to this RD that she is tolerating sips of clear liquids and is feeling a little better today. She also verbalized discomfort due to hold touch during exam. No family present to provide additional history.   Reviewed wt hx. Pt with no wt loss over the past year. Noted weight fluctuations. UBW trends between 218-233#.   Per pharmacy, plan to place PICC and initiate TPN today.  Pt has been without adequate nutrition for 6 days during hospitalization.   Medications reviewed and include solu-cortef.   Last Hgb A1c: 8.5 (09/01/18). PTA DM medications are 50 units insulin glargine q HS, 1000 mg metformin BID. Pt also on prednisone PTA, which may be contributing to blood sugar elevations.   Labs reviewed: CBGS: 214-232 (inpatient orders for glycemic control are 0-15 units insulin aspart every 4 hours).   NUTRITION - FOCUSED PHYSICAL EXAM:    Most Recent Value  Orbital Region  No depletion  Upper Arm Region  No depletion  Thoracic and Lumbar Region  No depletion  Buccal Region  No depletion  Temple Region  No depletion  Clavicle Bone Region  No depletion  Clavicle and Acromion Bone Region  No depletion  Scapular Bone Region  No depletion  Dorsal Hand  No depletion  Patellar Region  No depletion  Anterior Thigh Region  No depletion  Posterior Calf Region  No depletion  Edema (RD Assessment)  Mild  Hair  Reviewed  Eyes  Reviewed  Mouth  Reviewed  Skin  Reviewed  Nails  Reviewed       Diet Order:   Diet Order            Diet clear liquid Room service appropriate? Yes; Fluid consistency: Thin  Diet effective now              EDUCATION NEEDS:   Not appropriate for education at this time  Skin:  Skin Assessment: Skin Integrity Issues: Skin Integrity Issues:: Wound VAC, Stage II Stage II: sacrum Wound Vac:  abdomen  Last BM:  09/04/18 (5 ml output via colostomy)  Height:   Ht Readings from Last 1 Encounters:  08/23/2018 5\' 3"  (1.6 m)    Weight:   Wt Readings from Last 1 Encounters:  08/24/2018 106.5 kg    Ideal Body Weight:  52.3 kg  BMI:  Body mass index is 41.59 kg/m.  Estimated Nutritional Needs:   Kcal:  1650-1850  Protein:  115-130 grams  Fluid:  > 1.6 L    [name] A. Jimmye Norman, RD, LDN, CDE Pager: [phone] After hours Pager: [phone]

## 2018-09-06 NOTE — Progress Notes (Signed)
Noticed patients RLQ for JP drain was saturated, upon assessment and dressing change noticed that body fluid was draining around tube insertion site. Minimal drainage noted in JP. CCS paged for notification. Also noticed that patient as a stage II wound to inner labia, at this point I removed purwick for skin integrity purposes and will continue to keep area clean and dry.

## 2018-09-06 NOTE — Progress Notes (Signed)
Midline unsuccessful, RN made aware and will call MD for alternative line.

## 2018-09-06 NOTE — Progress Notes (Signed)
6 Days Post-Op   Subjective/Chief Complaint: Patient not mobile.  No ostomy output.  No nausea or vomiting.   Objective: Vital signs in last 24 hours: Temp:  [97.9 F (36.6 C)-99.3 F (37.4 C)] 98.4 F (36.9 C) (01/25 0752) Pulse Rate:  [83-121] 121 (01/25 0844) Resp:  [0-21] 21 (01/25 0844) BP: (121-159)/(60-79) 159/74 (01/25 0752) SpO2:  [93 %-100 %] 98 % (01/25 0844) Last BM Date: 09/04/18  Intake/Output from previous day: 01/24 0701 - 01/25 0700 In: 1749 [P.O.:420; I.V.:906; Blood:223; IV Piggyback:200] Out: 1200 [Urine:1200] Intake/Output this shift: No intake/output data recorded.  Incision/Wound: Pink warm ostomy.  Wound VAC in place.  Lab Results:  Recent Labs    09/05/18 0500 09/05/18 1930 09/06/18 0512  WBC 3.3*  --  4.0  HGB 7.8*  --  7.9*  HCT 25.0*  --  25.5*  PLT 17* 34* 30*   BMET Recent Labs    09/04/18 0435  NA 144  K 3.5  CL 111  CO2 24  GLUCOSE 164*  BUN 23  CREATININE 0.65  CALCIUM 7.4*   PT/INR Recent Labs    09/05/18 1930  LABPROT 15.3*  INR 1.22   ABG No results for input(s): PHART, HCO3 in the last 72 hours.  Invalid input(s): PCO2, PO2  Studies/Results: No results found.  Anti-infectives: Anti-infectives (From admission, onward)   Start     Dose/Rate Route Frequency Ordered Stop   08/25/2018 1100  meropenem (MERREM) 1 g in sodium chloride 0.9 % 100 mL IVPB  Status:  Discontinued     1 g 200 mL/hr over 30 Minutes Intravenous Every 8 hours 08/23/2018 1010 09/05/18 1019      Assessment/Plan: s/p Procedure(s): EXPLORATORY LAPAROTOMY; COLECTOMY WITH COLOSTOMY CREATION/HARTMANN PROCEDURE; LYSIS OF ADHESION; PLACEMENT OF VACUUM DRESSING, 100 CM2 (N/A) T2DM - A1c 8.5 HTN Severe COPD Lung cancer with pneumonitis on steroids - per medicine  Perforated diverticulitis of sigmoid colon POD 6,S/P ex-lap, partial colectomy with end colostomy 08/29/2018 Dr. Kieth Brightly - cont NPO until better colostomy output, but will allow a few  sips of clears from the floor. Expected postop ileus; certainly at risk for intraabd abscess - will plan repeat CT Sunday if not opening up - prn IV robaxin for pain control - JP and VAC with SS drainage, continue to monitor - VAC change today -needs to mobilize and get out of bed  Hypotension and tachycardia -hypotension resolved -still tachy, defer to medicine Elevated troponins- ?demand ischemia, per medicine Thrombocytopenia- plts down-trending, down  per medicine and heme  FEN: ice chips/sips of clears from floor VTE: SCDs;chemical dvt on hold secondary to low plts ID: [id number] 1/19>>  LOS: 6 days    [name] A [name] 09/06/2018

## 2018-09-06 NOTE — Progress Notes (Signed)
PHARMACY - ADULT TOTAL PARENTERAL NUTRITION CONSULT NOTE   Pharmacy Consult:  TPN Indication:  Prolonged ileus  Patient Measurements: Height: 5\' 3"  (160 cm) Weight: 234 lb 12.6 oz (106.5 kg) IBW/kg (Calculated) : 52.4 TPN AdjBW (KG): 65.9 Body mass index is 41.59 kg/m.  Assessment:  27 YOF presented on 09/09/2018 with abdominal pain and loose stools for 2 weeks that improved minimally with loperamide.  Found to have perforated diverticulitis and underwent ex-lap with partial colectomy, end colostomy and LoA on 08/18/2018.  Patient was started on a clear liquid diet since 09/04/18 and intake has been inadequate per charting.  Pharmacy consulted to manage TPN for post-op ileus.  Patient couldn't stay awake for an interview.  GI: hx GERD on PO Prilosec. Endo: DM on metformin and Lantus 50/d PTA, A1c 8.5%.  CBGs 200s (also on HC 50mg  BID) Insulin requirements in the past 24 hours: 11 units Lytes: high Na/CL, Phos 2.3, Mag 1.6, CoCa low at 8.26 Renal: SCr 0.58, BUN WNL - UOP 0.5 ml/kg/hr, LR at 60 ml/hr, net +8.1L Pulm: COPD on 1L [place] - [place], Pulmicort, Duonebs Cards: HTN/HLD - BP/HR elevated (in Afib) - PRN metoprolol Hepatobil: LFTs / tbili WNL Onc: SCC of left lung s/p immunotherapy - hgb 7.9, severe thrombocytopenia Neuro: Bipolar - PRN morphine, pain score 3 ID: [email or web address]/p Merrem - afebrile, WBC WNL  TPN Access: left chest port per Dr. Brantley Stage TPN start date: 09/06/18  Nutritional Goals (RD rec pending): 1600-1700 kCal and 90-105gm protein per day  Current Nutrition:  Clear liquid diet  Plan:  Initiate TPN at 30 ml/hr (goal rate 70 ml/hr) TPN will provide 42g AA, 84g CHO and 22g ILE for a total of 706 kCal, meeting ~40% of patient's needs Electrolytes in TPN: no Na, increased K/Ca/Mag/Phos, Cl:Ac 1:2 Daily multivitamin and trace elements in TPN Change SSI to moderate Q4H + add 20 units of regular insulin in TPN Reduce LR to 30 ml/hr when TPN starts KPhos 15 mmol IV x 1 Mag sulfate 2gm  IV x 1 Cal gluconate 1gm IV x 1 Standard TPN labs and nursing care orders, monitor CBGs   [name] D. Mina Marble, PharmD, BCPS, [place] 09/06/2018, 12:04 PM

## 2018-09-06 NOTE — Progress Notes (Signed)
Nurse put in order for IV team because patient is to begin TPN this evening at 1800 and we are accessing her port which can not be used for anything after TPN is started.   Unsuccessful for midline attempt as well as another attempt using dopler.   Nurse called [facility] and left a message for an order for a PICC line.  Currently infusing magnesium, then have potassium, and then calcium all normal saline IVPB so LR is stopped while these are being infused.

## 2018-09-07 ENCOUNTER — Inpatient Hospital Stay (HOSPITAL_COMMUNITY): Payer: PPO

## 2018-09-07 DIAGNOSIS — G9341 Metabolic encephalopathy: Secondary | ICD-10-CM

## 2018-09-07 LAB — COMPREHENSIVE METABOLIC PANEL
ALT: 419 U/L — ABNORMAL HIGH (ref 0–44)
AST: 63 U/L — ABNORMAL HIGH (ref 15–41)
Albumin: 1.4 g/dL — ABNORMAL LOW (ref 3.5–5.0)
Alkaline Phosphatase: 57 U/L (ref 38–126)
Anion gap: 7 (ref 5–15)
BUN: 21 mg/dL (ref 8–23)
CO2: 30 mmol/L (ref 22–32)
Calcium: 8.1 mg/dL — ABNORMAL LOW (ref 8.9–10.3)
Chloride: 112 mmol/L — ABNORMAL HIGH (ref 98–111)
Creatinine, Ser: 0.62 mg/dL (ref 0.44–1.00)
GFR calc Af Amer: >60 mL/min (ref >60)
GFR calc non Af Amer: >60 mL/min (ref >60)
Glucose, Bld: 256 mg/dL — ABNORMAL HIGH (ref 70–99)
POTASSIUM: 3.7 mmol/L (ref 3.5–5.1)
Sodium: 149 mmol/L — ABNORMAL HIGH (ref 135–145)
Total Bilirubin: 1 mg/dL (ref 0.3–1.2)
Total Protein: 4.4 g/dL — ABNORMAL LOW (ref 6.5–8.1)

## 2018-09-07 LAB — CBC WITH DIFFERENTIAL/PLATELET
Abs Immature Granulocytes: 0.08 10*3/uL — ABNORMAL HIGH (ref 0.00–0.07)
BASOS ABS: 0 10*3/uL (ref 0.0–0.1)
Basophils Relative: 1 %
Eosinophils Absolute: 0 10*3/uL (ref 0.0–0.5)
Eosinophils Relative: 0 %
HCT: 26.9 % — ABNORMAL LOW (ref 36.0–46.0)
Hemoglobin: 8.2 g/dL — ABNORMAL LOW (ref 12.0–15.0)
Immature Granulocytes: 2 %
Lymphocytes Relative: 6 %
Lymphs Abs: 0.3 10*3/uL — ABNORMAL LOW (ref 0.7–4.0)
MCH: 27.6 pg (ref 26.0–34.0)
MCHC: 30.5 g/dL (ref 30.0–36.0)
MCV: 90.6 fL (ref 80.0–100.0)
Monocytes Absolute: 0.3 10*3/uL (ref 0.1–1.0)
Monocytes Relative: 6 %
NEUTROS ABS: 4 10*3/uL (ref 1.7–7.7)
NEUTROS PCT: 85 %
Platelets: 26 10*3/uL — CL (ref 150–400)
RBC: 2.97 MIL/uL — ABNORMAL LOW (ref 3.87–5.11)
RDW: 18 % — ABNORMAL HIGH (ref 11.5–15.5)
WBC Morphology: INCREASED
WBC: 4.7 10*3/uL (ref 4.0–10.5)
nRBC: 15.4 % — ABNORMAL HIGH (ref 0.0–0.2)

## 2018-09-07 LAB — BLOOD GAS, ARTERIAL
Acid-Base Excess: 6.3 mmol/L — ABNORMAL HIGH (ref 0.0–2.0)
Bicarbonate: 29.5 mmol/L — ABNORMAL HIGH (ref 20.0–28.0)
Drawn by: 213381
O2 Content: 2 L/min
O2 Saturation: 92 %
Patient temperature: 98.6
pCO2 arterial: 36.4 mmHg (ref 32.0–48.0)
pH, Arterial: 7.519 — ABNORMAL HIGH (ref 7.350–7.450)
pO2, Arterial: 65.4 mmHg — ABNORMAL LOW (ref 83.0–108.0)

## 2018-09-07 LAB — PHOSPHORUS: Phosphorus: 3.4 mg/dL (ref 2.5–4.6)

## 2018-09-07 LAB — GLUCOSE, CAPILLARY
GLUCOSE-CAPILLARY: 231 mg/dL — AB (ref 70–99)
GLUCOSE-CAPILLARY: 238 mg/dL — AB (ref 70–99)
Glucose-Capillary: 159 mg/dL — ABNORMAL HIGH (ref 70–99)
Glucose-Capillary: 192 mg/dL — ABNORMAL HIGH (ref 70–99)
Glucose-Capillary: 207 mg/dL — ABNORMAL HIGH (ref 70–99)
Glucose-Capillary: 228 mg/dL — ABNORMAL HIGH (ref 70–99)
Glucose-Capillary: 249 mg/dL — ABNORMAL HIGH (ref 70–99)
Glucose-Capillary: 259 mg/dL — ABNORMAL HIGH (ref 70–99)

## 2018-09-07 LAB — PREALBUMIN: Prealbumin: 5.7 mg/dL — ABNORMAL LOW (ref 18–38)

## 2018-09-07 LAB — TRIGLYCERIDES: Triglycerides: 99 mg/dL (ref <150)

## 2018-09-07 LAB — MAGNESIUM: Magnesium: 1.8 mg/dL (ref 1.7–2.4)

## 2018-09-07 MED ORDER — TRAVASOL 10 % IV SOLN
INTRAVENOUS | Status: AC
  Administered 2018-09-07: 18:00:00 via INTRAVENOUS
  Filled 2018-09-07: qty 450

## 2018-09-07 MED ORDER — MAGNESIUM SULFATE 2 GM/50ML IV SOLN
2.0000 g | Freq: Once | INTRAVENOUS | Status: AC
  Administered 2018-09-07: 2 g via INTRAVENOUS
  Filled 2018-09-07: qty 50

## 2018-09-07 MED ORDER — FUROSEMIDE 10 MG/ML IJ SOLN
40.0000 mg | Freq: Two times a day (BID) | INTRAMUSCULAR | Status: AC
  Administered 2018-09-07 – 2018-09-09 (×4): 40 mg via INTRAVENOUS
  Filled 2018-09-07 (×4): qty 4

## 2018-09-07 MED ORDER — POTASSIUM CHLORIDE 10 MEQ/50ML IV SOLN
10.0000 meq | INTRAVENOUS | Status: AC
  Administered 2018-09-07 (×3): 10 meq via INTRAVENOUS
  Filled 2018-09-07 (×3): qty 50

## 2018-09-07 MED ORDER — ALBUMIN HUMAN 25 % IV SOLN
25.0000 g | Freq: Four times a day (QID) | INTRAVENOUS | Status: AC
  Administered 2018-09-07 – 2018-09-09 (×8): 25 g via INTRAVENOUS
  Filled 2018-09-07 (×8): qty 100

## 2018-09-07 MED ORDER — IOPAMIDOL (ISOVUE-370) INJECTION 76%
INTRAVENOUS | Status: AC
  Filled 2018-09-07: qty 100

## 2018-09-07 MED ORDER — IOPAMIDOL (ISOVUE-370) INJECTION 76%
100.0000 mL | Freq: Once | INTRAVENOUS | Status: AC | PRN
  Administered 2018-09-07: 100 mL via INTRAVENOUS

## 2018-09-07 MED ORDER — METOPROLOL TARTRATE 5 MG/5ML IV SOLN
5.0000 mg | Freq: Four times a day (QID) | INTRAVENOUS | Status: DC
  Administered 2018-09-07: 2.5 mg via INTRAVENOUS
  Administered 2018-09-08 – 2018-09-10 (×11): 5 mg via INTRAVENOUS
  Filled 2018-09-07 (×14): qty 5

## 2018-09-07 MED ORDER — METOPROLOL TARTRATE 5 MG/5ML IV SOLN
INTRAVENOUS | Status: AC
  Administered 2018-09-07: 2.5 mg via INTRAVENOUS
  Filled 2018-09-07: qty 5

## 2018-09-07 NOTE — Progress Notes (Signed)
RT Note: Rt was not notified that a STAT ABG was order on patient. As soon as RT became aware, we attempted to obtain the lab. One RT attempted and stuck twice but was unsuccessful. Charge RT is en route to attempt to obtain the ABG presently. Two ABG orders are entered on the patient so RT is contacting the doctor now to clarify and see if both are needed. Rt will continue to monitor and assist as needed.

## 2018-09-07 NOTE — Progress Notes (Signed)
Patient ID: Suezanne Cheshire, female   DOB: June 19, 1946, 73 y.o.   MRN: [id number]   Acute Care Surgery Service Progress Note:    Chief Complaint/Subjective: Nurse and family at [facility] Pt doesn't move much, is max assist Not talking much either  C/o stomach pain  Objective: Vital signs in last 24 hours: Temp:  [98 F (36.7 C)-98.9 F (37.2 C)] 98 F (36.7 C) (01/26 0315) Pulse Rate:  [115-132] 121 (01/26 0325) Resp:  [18-29] 18 (01/26 0325) BP: (145-159)/(68-79) 159/68 (01/26 0325) SpO2:  [88 %-98 %] 97 % (01/26 0806) Last BM Date: 09/04/18  Intake/Output from previous day: 01/25 0701 - 01/26 0700 In: 66.3 [I.V.:66.3] Out: 1520 [Urine:1500; Drains:20] Intake/Output this shift: Total I/O In: 437 [I.V.:437] Out: -   Lungs: some coarse BS, nonlabored  Cardiovascular: reg  Abd: obese, soft, wound vac intact; ostomy viable - no air/stool in bag  Extremities: no edema, +SCDs  Neuro: sleepy but arousable, nonfocal  Lab Results: CBC  Recent Labs    09/06/18 0512 09/07/18 0408  WBC 4.0 4.7  HGB 7.9* 8.2*  HCT 25.5* 26.9*  PLT 30* 26*   BMET Recent Labs    09/06/18 1120 09/07/18 0408  NA 146* 149*  K 3.6 3.7  CL 112* 112*  CO2 26 30  GLUCOSE 298* 256*  BUN 21 21  CREATININE 0.58 0.62  CALCIUM 7.7* 8.1*   LFT Hepatic Function Latest Ref Rng & Units 09/07/2018 09/04/2018 08/27/2018  Total Protein 6.5 - 8.1 g/dL 4.4(L) 6.1(L) 6.4(L)  Albumin 3.5 - 5.0 g/dL 1.4(L) 3.3(L) 3.5  AST 15 - 41 U/L 63(H) 35 23  ALT 0 - 44 U/L 419(H) 44 31  Alk Phosphatase 38 - 126 U/L 57 59 45  Total Bilirubin 0.3 - 1.2 mg/dL 1.0 0.9 0.9  Bilirubin, Direct 0.0 - 0.3 mg/dL - - -   PT/INR Recent Labs    09/05/18 1930  LABPROT 15.3*  INR 1.22   ABG No results for input(s): PHART, HCO3 in the last 72 hours.  Invalid input(s): PCO2, PO2  Studies/Results:  Anti-infectives: Anti-infectives (From admission, onward)   Start     Dose/Rate Route Frequency Ordered Stop   09/12/2018  1100  meropenem (MERREM) 1 g in sodium chloride 0.9 % 100 mL IVPB  Status:  Discontinued     1 g 200 mL/hr over 30 Minutes Intravenous Every 8 hours 08/22/2018 1010 09/05/18 1019      Medications: Scheduled Meds: . arformoterol  15 mcg Nebulization BID  . budesonide (PULMICORT) nebulizer solution  0.5 mg Nebulization BID  . chlorhexidine  15 mL Mouth Rinse BID  . hydrocortisone sod succinate (SOLU-CORTEF) inj  50 mg Intravenous Q12H  . insulin aspart  0-15 Units Subcutaneous Q4H  . ipratropium-albuterol  3 mL Nebulization BID  . mouth rinse  15 mL Mouth Rinse q12n4p  . metoprolol tartrate  25 mg Oral BID  . omeprazole  40 mg Oral Daily  . sodium chloride flush  10-40 mL Intracatheter Q12H   Continuous Infusions: . sodium chloride 10 mL/hr at 09/07/18 0929  . lactated ringers 30 mL/hr at 09/06/18 1815  . magnesium sulfate 1 - 4 g bolus IVPB    . methocarbamol (ROBAXIN) IV    . potassium chloride 10 mEq (09/07/18 0929)  . TPN ADULT (ION) 30 mL/hr at 09/06/18 1732  . TPN ADULT (ION)     PRN Meds:.sodium chloride, acetaminophen **OR** acetaminophen, albuterol, methocarbamol (ROBAXIN) IV, morphine injection, ondansetron **OR** ondansetron (ZOFRAN) IV,  sodium chloride flush  Assessment/Plan: Patient Active Problem List   Diagnosis Date Noted  . Diverticulitis of colon with perforation 08/22/2018  . Uncontrolled type 2 diabetes mellitus with hyperglycemia ([place]) 08/23/2018  . Pressure injury of skin 08/19/2018  . S/P exploratory laparotomy   . Hypertensive urgency   . Diabetes mellitus, insulin dependent (IDDM), uncontrolled ([place]) 08/28/2018  . Recurrent falls while walking 08/28/2018  . Hyperglycemia 08/08/2018  . Leukocytosis 08/08/2018  . Thrombocytopenia ([place]) 08/08/2018  . SOB (shortness of breath) 07/22/2018  . Chest pain 07/22/2018  . Chronic pain 07/22/2018  . Chronic constipation 07/22/2018  . Physical deconditioning 07/22/2018  . Family history of stomach cancer   .  Family history of lung cancer   . Malignant neoplasm of upper lobe of left lung ([place]) 01/22/2018  . Hospital discharge follow-up 11/09/2017  . Squamous cell lung cancer, left ([place]) 11/01/2017  . COPD (chronic obstructive pulmonary disease) ([place]) 10/29/2017  . Emphysema lung ([place]) 02/26/2017  . Mass of upper lobe of left lung 02/26/2017  . Encounter for chronic pain management 08/26/2016  . Anemia, deficiency 07/27/2016  . H/O nicotine dependence 12/05/2014  . Anxiety and depression 12/31/2013  . Female bladder prolapse 03/10/2013  . Impaired fasting glucose 10/03/2010  . Vitamin D deficiency 09/08/2009  . Back pain with sciatica 04/04/2009  . HTN (hypertension) 06/09/2008  . ABDOMINAL WALL HERNIA 06/09/2008  . HLD (hyperlipidemia) 11/14/2007  . Morbid obesity ([place]) 11/14/2007  . Anxiety 11/14/2007  . 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 Kieth Brightly - ileus -I think we could try clears but given how sleepy she is I would wait til she is a little more alert;  - prn IV robaxin for pain control - JP and VAC with SS drainage, continue to monitor -VAC  -needs to mobilize and get out of bed - asked nurse to see if they could move her to room with lifter in it  Hypotension and tachycardia -hypotension resolved -still tachy, defer to medicine Elevated troponins- ?demand ischemia, per medicine Thrombocytopenia- plts down-trending, down  per medicine and heme  FEN: ice chips/sips of clears from floor VTE: SCDs;chemical dvt on hold secondary to low plts ID: [email or web address] 1/19>>  Disposition: ct reviewed.  I  dont  think fluid collection represents a infection. Would monitor for now. Cont TPN. Updated family   LOS: 7 days    Leighton Ruff. Redmond Pulling, MD, FACS General, Bariatric, & Minimally Invasive Surgery [phone] [facility]

## 2018-09-07 NOTE — Progress Notes (Signed)
Hematology  Reviewed notes and labs CBC Latest Ref Rng & Units 09/07/2018 09/06/2018 09/05/2018  WBC 4.0 - 10.5 K/uL 4.7 4.0 -  Hemoglobin 12.0 - 15.0 g/dL 8.2(L) 7.9(L) -  Hematocrit 36.0 - 46.0 % 26.9(L) 25.5(L) -  Platelets 150 - 400 K/uL 26(LL) 30(L) 34(L)   DIC panel: PT, PTT INR: Normal with very mild inc of PT (prob due to slight Vit K def); No evidence of DIC  - No need of platelet transfusion  - Monitor for now since Meropenam has been stopped.

## 2018-09-07 NOTE — Progress Notes (Signed)
PHARMACY - ADULT TOTAL PARENTERAL NUTRITION CONSULT NOTE   Pharmacy Consult:  TPN Indication:  Prolonged ileus  Patient Measurements: Height: '5\' 3"'  (160 cm) Weight: 234 lb 12.6 oz (106.5 kg) IBW/kg (Calculated) : 52.4 TPN AdjBW (KG): 65.9 Body mass index is 41.59 kg/m.  Assessment:  35 YOF presented on 08/28/2018 with abdominal pain and loose stools for 2 weeks that improved minimally with loperamide.  Found to have perforated diverticulitis and underwent ex-lap with partial colectomy, end colostomy and LoA on 08/30/2018.  Patient was started on a clear liquid diet since 09/04/18 and intake has been inadequate per charting.  Pharmacy consulted to manage TPN for post-op ileus.  Patient couldn't stay awake for an interview.  GI: hx GERD on PO Prilosec.  BL prealbumin low at 5.7.  Body fluid draining around JP drain, O/P 74m Endo: DM on metformin and Lantus 50/d PTA, A1c 8.5%.  CBGs 200s (also on HC 534mBID) Insulin requirements in the past 24 hours: 21 units + 20 units in TPN Lytes: high Na/CL, K 3.7 (goal >/= 4 for ileus), Mag 1.8 (goal >/= 2 for ileus) Renal: SCr 0.58, BUN WNL - UOP 0.465 ml/kg/hr, LR at 30 ml/hr, net +6.6L Pulm: COPD on 2L [place] - [place], Pulmicort, Duonebs Cards: HTN/HLD - BP/HR elevated (in Afib) - Lopressor Hepatobil: AST/ALT up to 63/419, alk phos / tbili / TG WNL Onc: SCC of left lung s/p immunotherapy - hgb 8.2, severe thrombocytopenia Neuro: Bipolar - PRN morphine, pain score 3 ID: [email or web address] Merrem - afebrile, WBC WNL  TPN Access: left chest port per Dr. [place]rant[place] Stage PICC placed 09/06/18 TPN start date: 09/06/18  Nutritional Goals (RD rec on 1/25): 1650-1850 kCal and 115-130gm protein per day  Current Nutrition:  TPN  Plan:  Continue TPN at 30 ml/hr (goal rate 80 ml/hr) TPN provides 45g AA, 78g CHO and 19g ILE for a total of 640 kCal, meeting ~40% of patient's needs Electrolytes in TPN: no Na, increase K/Mag, decrease Ca, Cl:Ac 1:2 Daily multivitamin and trace elements  in TPN Continue moderate SSI Q4H + increase regular insulin in TPN to 30 units Continue LR at 30 ml/hr KCL x 3 runs Mag sulfate 2gm IV x 1 F/U AM labs, CBGs to advance TPN   Erling Arrazola D. DaMina MarblePharmD, BCPS, BCTimberlake/26/2020, 7:58 AM

## 2018-09-07 NOTE — Progress Notes (Signed)
PROGRESS NOTE    [name]  VVO:[id number]  DOB: 05-06-1946  DOA: 08/19/2018 PCP: Fayrene Helper, MD  Brief Narrative:   73 year old female with history of COPD, stage III squamous cell lung cancer who has been following Dr. Delton Coombes on chemotherapy as outpatient, chronic thrombocytopenia, anxiety/depression, diabetes mellitus, diastolic CHF on Lasix admitted on January 19 with abdominal pain/diarrhea.  CT abdomen pelvis showed perforated diverticulitis at the level of descending colon/sigmoid colon with air tracking up to left pelvis.  Patient seen by general surgery and underwent colectomy/colostomy. Chest x-ray reported mid left lung infiltrate: Atelectasis versus pneumonia.  Patient also treated with high-dose steroids during the hospital course.  Cortisol level however was 25.5 on admission.  (Patient on chronic prednisone 40 mg twice daily and Bactrim 3 times a week).  Hospital course complicated by acute on chronic thrombocytopenia.  She has received blood transfusions and plasma transfusion.  Seen by hematology for thrombocytopenia.  No DIC as per hematologist.  71/26/2020: Patient seen alongside patient's husband, son, daughter-in-law, Tourist information centre manager.  Patient remains sleepy but easily arousable.  Patient follows command.  Low albumin is noted (1.4).  CO2 of 30 is noted.  Patient is morbidly obese, and snores.  However, no objectively confirmed OSA as per patient's husband.  Highly elevated sodium of 149 is noted.  The same is true 0.7 and magnesium is 1.8.  Albumin is only 5.7.  Will start patient on IV albumin 25 g every 6 hourly x8 doses.  We will also gave IV Lasix 40 Mg every 12 hours x4 doses.  Will monitor labs in the morning.  Subjective: Patient remains sleepy, but easily arousable.  Patient follows command.  Tachycardia persists.  No significant history from the patient.  Objective: Vitals:   09/07/18 0325 09/07/18 0758 09/07/18 0802 09/07/18 0806  BP: (!) 159/68      Pulse: (!) 121     Resp: 18     Temp:      TempSrc:      SpO2: 98% 96% 97% 97%  Weight:      Height:        Intake/Output Summary (Last 24 hours) at 09/07/2018 0947 Last data filed at 09/07/2018 0934 Gross per 24 hour  Intake 503.34 ml  Output 1520 ml  Net -1016.66 ml   Filed Weights   08/25/2018 1350 08/24/2018 2110  Weight: 99.2 kg 106.5 kg    Physical Examination:  General exam: Patient is morbidly obese.  Appears sleepy/somnolent but easily responsive to verbal stimuli.   Respiratory system: Decreased air entry globally.   Cardiovascular system: S1 & S2 heard.  Gastrointestinal system: Abdomen is bili obese, drains noted, ostomy bag in place ventral abdominal wound is dressed.  Organs are difficult to assess.  Central nervous system: Sleepy/somnolent.   Data Reviewed: I have personally reviewed following labs and imaging studies  CBC: Recent Labs  Lab 09/02/18 0318  09/03/18 0625 09/05/18 0500 09/05/18 1930 09/06/18 0512 09/07/18 0408  WBC 6.3  --  4.1 3.3*  --  4.0 4.7  NEUTROABS  --   --  2.4 2.7  --  3.4 4.0  HGB 10.7*  --  8.5* 7.8*  --  7.9* 8.2*  HCT 33.2*  --  26.5* 25.0*  --  25.5* 26.9*  MCV 85.1  --  87.2 90.3  --  91.4 90.6  PLT 25*   < > 34* 17* 34* 30* 26*   < > = values in this interval not displayed.  Basic Metabolic Panel: Recent Labs  Lab 08/19/2018 2150 09/01/18 0048 09/01/18 0535  09/02/18 0318 09/03/18 0625 09/04/18 0435 09/06/18 1120 09/07/18 0408  NA 127* 136 134*   < > 137 141 144 146* 149*  K 5.1 4.8 4.5   < > 4.5 3.7 3.5 3.6 3.7  CL 97* 108 107   < > 109 111 111 112* 112*  CO2 17* 13* 16*   < > 18* 24 24 26 30   GLUCOSE 239* 112* 110*   < > 113* 134* 164* 298* 256*  BUN 18 18 20    < > 32* 27* 23 21 21   CREATININE 0.78 1.00 0.86   < > 1.29* 0.84 0.65 0.58 0.62  CALCIUM 7.3* 6.2* 6.9*   < > 7.4* 7.4* 7.4* 7.7* 8.1*  MG 1.7 1.8 2.2  --  2.1 2.2  --  1.6* 1.8  PHOS 5.5* 5.9*  --   --  4.8*  --   --  2.3* 3.4   < > = values in  this interval not displayed.   GFR: Estimated Creatinine Clearance: 76.4 mL/min (by C-G formula based on SCr of 0.62 mg/dL). Liver Function Tests: Recent Labs  Lab 09/07/18 0408  AST 63*  ALT 419*  ALKPHOS 57  BILITOT 1.0  PROT 4.4*  ALBUMIN 1.4*   No results for input(s): LIPASE, AMYLASE in the last 168 hours. No results for input(s): AMMONIA in the last 168 hours. Coagulation Profile: Recent Labs  Lab 09/01/18 0048 09/02/18 1203 09/05/18 1930  INR 1.70 1.57 1.22   Cardiac Enzymes: Recent Labs  Lab 09/01/18 0927 09/01/18 1840 09/01/18 2138 09/02/18 0318 09/02/18 0946  TROPONINI 0.07* 0.08* 0.06* 0.06* 0.05*   BNP (last 3 results) No results for input(s): PROBNP in the last 8760 hours. HbA1C: No results for input(s): HGBA1C in the last 72 hours. CBG: Recent Labs  Lab 09/06/18 0733 09/06/18 1117 09/06/18 2328 09/07/18 0323 09/07/18 0836  GLUCAP 214* 259* 247* 228* 192*   Lipid Profile: Recent Labs    09/07/18 0408  TRIG 99   Thyroid Function Tests: No results for input(s): TSH, T4TOTAL, FREET4, T3FREE, THYROIDAB in the last 72 hours. Anemia Panel: No results for input(s): VITAMINB12, FOLATE, FERRITIN, TIBC, IRON, RETICCTPCT in the last 72 hours. Sepsis Labs: Recent Labs  Lab 09/01/18 0535 09/01/18 0750 09/01/18 1820 09/01/18 2138  LATICACIDVEN 3.8* 3.9* 3.4* 2.4*    Recent Results (from the past 240 hour(s))  Urine culture     Status: Abnormal   Collection Time: 09/05/2018  8:00 AM  Result Value Ref Range Status   Specimen Description URINE, CLEAN CATCH  Final   Special Requests   Final    NONE Performed at [facility], [address], [place], [place] [address]    Culture (A)  Final    50,000 COLONIES/mL MULTIPLE SPECIES PRESENT, SUGGEST RECOLLECTION   Report Status 09/01/2018 FINAL  Final  Culture, blood (routine x 2)     Status: None   Collection Time: 08/19/2018 10:05 PM  Result Value Ref Range Status   Specimen Description BLOOD  RIGHT HAND  Final   Special Requests   Final    BOTTLES DRAWN AEROBIC ONLY Blood Culture adequate volume   Culture   Final    NO GROWTH 5 DAYS Performed at [facility], [place] [address], [place], [place] [address]    Report Status 09/05/2018 FINAL  Final  Culture, blood (routine x 2)     Status: None  Collection Time: 08/26/2018 10:05 PM  Result Value Ref Range Status   Specimen Description BLOOD LEFT HAND  Final   Special Requests   Final    BOTTLES DRAWN AEROBIC ONLY Blood Culture results may not be optimal due to an inadequate volume of blood received in culture bottles   Culture   Final    NO GROWTH 5 DAYS Performed at [facility], [place] [address], [place], [place] [address]    Report Status 09/05/2018 FINAL  Final      Radiology Studies: Ct Head Wo Contrast  Result Date: 09/06/2018 CLINICAL DATA:  Encephalopathy EXAM: CT HEAD WITHOUT CONTRAST TECHNIQUE: Contiguous axial images were obtained from the base of the skull through the vertex without intravenous contrast. COMPARISON:  None. FINDINGS: Brain: There is no mass, hemorrhage or extra-axial collection. The size and configuration of the ventricles and extra-axial CSF spaces are normal. The brain parenchyma is normal, without acute or chronic infarction. Vascular: No abnormal hyperdensity of the major intracranial arteries or dural venous sinuses. No intracranial atherosclerosis. Skull: The visualized skull base, calvarium and extracranial soft tissues are normal. Sinuses/Orbits: No fluid levels or advanced mucosal thickening of the visualized paranasal sinuses. No mastoid or middle ear effusion. The orbits are normal. IMPRESSION: Normal aging brain. Electronically Signed   By: Ulyses Jarred M.D.   On: 09/06/2018 23:22   Ct Abdomen Pelvis W Contrast  Result Date: 09/07/2018 CLINICAL DATA:  Abdominal pain. Review of electronic records demonstrates colectomy 08/27/2018. EXAM: CT ABDOMEN AND PELVIS WITH CONTRAST TECHNIQUE:  Multidetector CT imaging of the abdomen and pelvis was performed using the standard protocol following bolus administration of intravenous contrast. CONTRAST:  167mL OMNIPAQUE IOHEXOL 300 MG/ML  SOLN COMPARISON:  Preoperative CT 08/14/2018. PET-CT 11/13/2017 FINDINGS: Lower chest: Small to moderate left pleural effusion with adjacent compressive atelectasis in the left lower lobe. Trace right pleural effusion and right basilar atelectasis. Nodularity in the region of linear atelectasis in the right lower lobe spanning 9 mm was not present on recent prior exam and likely nodular atelectasis. Hepatobiliary: No focal liver abnormality is seen. Status post cholecystectomy. Stable biliary prominence from prior. Pancreas: No ductal dilatation or inflammation. Spleen: Normal in size without focal abnormality. Posterior splenic cleft, incidental. Adrenals/Urinary Tract: Normal adrenal glands. No hydronephrosis or perinephric edema. Homogeneous renal enhancement with symmetric excretion on delayed phase imaging. Urinary bladder is decompressed. Stomach/Bowel: Recent laparotomy with transverse colostomy in the left lower quadrant. Stapled off sigmoid colon in the pelvis is unremarkable. Surgical drain remains in place, tip in the left pericolic gutter. Mild wall thickening of the ascending and proximal transverse colon. Small bowel in the central abdomen are dilated and fluid-filled with air-fluid levels, no abnormal gastric distension. Vascular/Lymphatic: Normal caliber abdominal aorta. No adenopathy. Reproductive: Status post hysterectomy. No adnexal masses. Other: Post recent midline laparotomy. Small amount of free fluid in the left pericolic gutter and left upper quadrant. Patchy edema primarily in the left abdomen may represent an element of fat necrosis postop. Crescentic 4.3 cm fluid in the midline deep pelvis may represent fluid in the vagina or small pelvic fluid collection. No other focal fluid collection or  abscess. No residual free air. Soft tissue edema in the bilateral flanks, right greater than left. Skin thickening and soft tissue edema about the lateral left hip. Musculoskeletal: Multilevel degenerative change throughout spine. Degenerative change in both hips. There are no acute or suspicious osseous abnormalities. IMPRESSION: 1. Post recent midline laparotomy with transverse colostomy in the  left lower quadrant. Expected postsurgical changes small amount of free fluid in patchy edema in the left abdomen. 2. Crescentic 4.3 cm fluid collection in the midline deep pelvis may represent fluid in the vagina or small pelvic fluid collection, sterility indeterminate by imaging. No other focal fluid collection. 3. Dilated fluid-filled central small bowel suggesting postoperative ileus. 4. Mild wall thickening of the ascending and proximal transverse colon, reactive wall thickening versus colitis. 5. Small to moderate left pleural effusion with adjacent compressive atelectasis. Right lower lobe atelectasis is a slightly nodular configuration, but is new from prior exam and likely more focal atelectatic change. Attention to this at follow-up recommended given history of lung cancer. Electronically Signed   By: Keith Rake M.D.   On: 09/07/2018 01:04   [place] Ekg Site Rite  Result Date: 09/06/2018 If Site Rite image not attached, placement could not be confirmed due to current cardiac rhythm.       Scheduled Meds: . arformoterol  15 mcg Nebulization BID  . budesonide (PULMICORT) nebulizer solution  0.5 mg Nebulization BID  . chlorhexidine  15 mL Mouth Rinse BID  . hydrocortisone sod succinate (SOLU-CORTEF) inj  50 mg Intravenous Q12H  . insulin aspart  0-15 Units Subcutaneous Q4H  . ipratropium-albuterol  3 mL Nebulization BID  . mouth rinse  15 mL Mouth Rinse q12n4p  . metoprolol tartrate  25 mg Oral BID  . omeprazole  40 mg Oral Daily  . sodium chloride flush  10-40 mL Intracatheter Q12H    Continuous Infusions: . sodium chloride 10 mL/hr at 09/07/18 0929  . lactated ringers 30 mL/hr at 09/06/18 1815  . magnesium sulfate 1 - 4 g bolus IVPB    . methocarbamol (ROBAXIN) IV    . potassium chloride 10 mEq (09/07/18 0929)  . TPN ADULT (ION) 30 mL/hr at 09/06/18 1732  . TPN ADULT (ION)      Assessment & Plan:    1.  Perforated diverticulitis with sepsis present on admission:  Afebrile, no leukocytosis.  Lactate 6.4 on presentation, now improving and downtrending.  Status post surgery/colostomy/wound VAC in place.  General surgery concerned about low ostomy output.  N.p.o. for now and plan to start TPN tonight once PICC line access available.  Meropenem discontinued yesterday.  Discussed with general surgery who were concerned about possible intra-abdominal infection, will order CT abdomen pelvis with IV contrast only today. If concern for infection will treat with Rocephin/Flagyl and avoid meropenem and concern for thrombocytopenia. 09/07/2018: Continue current management.  2.  Altered mental status:  Likely combined toxic and metabolic.   Continue to manage primary process.    3.   Acute on chronic thrombocytopenia:  patient appears to be pancytopenic. Secondary to problem #1 versus medication induced (chemotherapy, meropenem, Protonix, Bactrim as reviewed by pharmacy). Received platelet transfusion on January 21.  Being followed by hematology, DIC work-up sent.  Another round of platelet transfusion ordered 1/24 .  Platelet count 19 ->34-> 19-->30.  She has not been on Bactrim while hospitalized.  Meropenem discontinued 1/24.  Protonix changed to omeprazole..  No active signs of bleeding currently.  Monitor with daily CBC 09/07/2018: Continue to monitor.  Platelet count is 6000 today.  No bleeding.  4.  Acute hypoxic respiratory failure: Currently on 1 L O2 via nasal cannula.She does have underlying history of COPD and stage III squamous cell lung CA. she was started on  high-dose steroids (prednisone 40 mg twice daily) as outpatient and concern for pneumonitis on CT.  she was started  on stress dose steroids during ICU stay.  Tapering slowly as tolerated.  May need to taper to off rapidly if CT abdomen does show infection.  Chest x-ray on January 20 reported a left midlung infiltrate possibly pneumonia.T-max is 99.  No evidence of leukocytosis.status post 6 days IV meropenem.  Incentive spirometry as tolerated. 09/07/2018: ABG.  Suspect previously undiagnosed OSA/OHS.  Continued nebulizer treatment.  5. Diastolic CHF, elevated troponin:  Downtrending troponin.  Likely demand ischemia.  On Cardizem at baseline which is on hold due to n.p.o. status.  Given persistent tachycardia added metoprolol today.  Underlying cause could be pain/infection.  On Lasix as outpatient, currently on hold as patient n.p.o. 09/07/2018: IV Lasix 40 mg every 12 hours x 4 doses.  Continue to assess and manage accordingly.  6.  AKI:  Creatinine peaked to 1.5 during hospital course and now normalized.  Normalized with IV fluids 09/07/2018: AKI has resolved.  7.  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 On stress dose IV steroids while here. Will start tapering and monitor blood pressure.    8. GERD:  IV Protonix changed to oral omeprazole  9. COPD: Continue neb treatments/inhaler therapy.   DVT prophylaxis: SCDs given thrombocytopenia Code Status: Full code Family / Patient Communication: Discussed with son who  was present bedside.  He understands overall poor prognosis. Disposition Plan: May need skilled nursing facility.  PT evaluation when able to tolerate oral intake.  Palliative care consult for care goal discussions.    LOS: 7 days     Bonnell Public, MD [facility] Pager [phone]   If 7PM-7AM, please contact night-coverage [email or web address] Password [facility] 09/07/2018, 9:47 AM

## 2018-09-07 NOTE — Plan of Care (Signed)
PMT note:   Did not reach husband at home phone number. No VM answered. Called cell number which the son answered. Plan for family meeting at 12:00 tomorrow.

## 2018-09-08 ENCOUNTER — Inpatient Hospital Stay (HOSPITAL_COMMUNITY): Payer: PPO

## 2018-09-08 DIAGNOSIS — J9621 Acute and chronic respiratory failure with hypoxia: Secondary | ICD-10-CM

## 2018-09-08 DIAGNOSIS — E870 Hyperosmolality and hypernatremia: Secondary | ICD-10-CM

## 2018-09-08 DIAGNOSIS — Z7189 Other specified counseling: Secondary | ICD-10-CM

## 2018-09-08 DIAGNOSIS — Z515 Encounter for palliative care: Secondary | ICD-10-CM

## 2018-09-08 LAB — CBC WITH DIFFERENTIAL/PLATELET
Abs Immature Granulocytes: 0.03 10*3/uL (ref 0.00–0.07)
Basophils Absolute: 0.1 10*3/uL (ref 0.0–0.1)
Basophils Relative: 2 %
Eosinophils Absolute: 0 10*3/uL (ref 0.0–0.5)
Eosinophils Relative: 0 %
HCT: 23.8 % — ABNORMAL LOW (ref 36.0–46.0)
Hemoglobin: 7.1 g/dL — ABNORMAL LOW (ref 12.0–15.0)
Immature Granulocytes: 1 %
Lymphocytes Relative: 13 %
Lymphs Abs: 0.3 10*3/uL — ABNORMAL LOW (ref 0.7–4.0)
MCH: 27.6 pg (ref 26.0–34.0)
MCHC: 29.8 g/dL — ABNORMAL LOW (ref 30.0–36.0)
MCV: 92.6 fL (ref 80.0–100.0)
Monocytes Absolute: 0.2 10*3/uL (ref 0.1–1.0)
Monocytes Relative: 9 %
Neutro Abs: 1.9 10*3/uL (ref 1.7–7.7)
Neutrophils Relative %: 75 %
PLATELETS: 20 10*3/uL — AB (ref 150–400)
RBC: 2.57 MIL/uL — ABNORMAL LOW (ref 3.87–5.11)
RDW: 18.5 % — AB (ref 11.5–15.5)
WBC: 2.5 10*3/uL — ABNORMAL LOW (ref 4.0–10.5)
nRBC: 18.7 % — ABNORMAL HIGH (ref 0.0–0.2)

## 2018-09-08 LAB — COMPREHENSIVE METABOLIC PANEL
ALT: 207 U/L — ABNORMAL HIGH (ref 0–44)
AST: 50 U/L — ABNORMAL HIGH (ref 15–41)
Albumin: 2.6 g/dL — ABNORMAL LOW (ref 3.5–5.0)
Alkaline Phosphatase: 46 U/L (ref 38–126)
Anion gap: 7 (ref 5–15)
BUN: 21 mg/dL (ref 8–23)
CHLORIDE: 112 mmol/L — AB (ref 98–111)
CO2: 32 mmol/L (ref 22–32)
Calcium: 8.4 mg/dL — ABNORMAL LOW (ref 8.9–10.3)
Creatinine, Ser: 0.58 mg/dL (ref 0.44–1.00)
GFR calc Af Amer: >60 mL/min (ref >60)
GFR calc non Af Amer: >60 mL/min (ref >60)
Glucose, Bld: 163 mg/dL — ABNORMAL HIGH (ref 70–99)
Potassium: 3.3 mmol/L — ABNORMAL LOW (ref 3.5–5.1)
Sodium: 151 mmol/L — ABNORMAL HIGH (ref 135–145)
Total Bilirubin: 1.1 mg/dL (ref 0.3–1.2)
Total Protein: 4.9 g/dL — ABNORMAL LOW (ref 6.5–8.1)

## 2018-09-08 LAB — GLUCOSE, CAPILLARY
GLUCOSE-CAPILLARY: 241 mg/dL — AB (ref 70–99)
Glucose-Capillary: 148 mg/dL — ABNORMAL HIGH (ref 70–99)
Glucose-Capillary: 149 mg/dL — ABNORMAL HIGH (ref 70–99)
Glucose-Capillary: 133 mg/dL — ABNORMAL HIGH (ref 70–99)
Glucose-Capillary: 236 mg/dL — ABNORMAL HIGH (ref 70–99)

## 2018-09-08 LAB — TRIGLYCERIDES: TRIGLYCERIDES: 95 mg/dL (ref <150)

## 2018-09-08 LAB — PREALBUMIN: Prealbumin: 5.7 mg/dL — ABNORMAL LOW (ref 18–38)

## 2018-09-08 LAB — OSMOLALITY, URINE: Osmolality, Ur: 370 mOsm/kg (ref 300–900)

## 2018-09-08 LAB — MAGNESIUM: Magnesium: 1.9 mg/dL (ref 1.7–2.4)

## 2018-09-08 LAB — SODIUM, URINE, RANDOM: Sodium, Ur: 87 mmol/L

## 2018-09-08 LAB — OSMOLALITY: Osmolality: 329 mOsm/kg (ref 275–295)

## 2018-09-08 LAB — PHOSPHORUS: Phosphorus: 3.5 mg/dL (ref 2.5–4.6)

## 2018-09-08 MED ORDER — LACTATED RINGERS IV SOLN
INTRAVENOUS | Status: DC
  Administered 2018-09-08 – 2018-09-09 (×2): via INTRAVENOUS

## 2018-09-08 MED ORDER — POTASSIUM CHLORIDE 10 MEQ/50ML IV SOLN
10.0000 meq | INTRAVENOUS | Status: AC
  Administered 2018-09-08 – 2018-09-09 (×5): 10 meq via INTRAVENOUS
  Filled 2018-09-08 (×6): qty 50

## 2018-09-08 MED ORDER — MAGNESIUM SULFATE 2 GM/50ML IV SOLN
2.0000 g | Freq: Once | INTRAVENOUS | Status: AC
  Administered 2018-09-08: 2 g via INTRAVENOUS
  Filled 2018-09-08: qty 50

## 2018-09-08 MED ORDER — TRAVASOL 10 % IV SOLN
INTRAVENOUS | Status: AC
  Administered 2018-09-08: 18:00:00 via INTRAVENOUS
  Filled 2018-09-08: qty 750

## 2018-09-08 MED ORDER — POTASSIUM CHLORIDE 10 MEQ/50ML IV SOLN: 10.0000 meq | INTRAVENOUS | Status: DC

## 2018-09-08 NOTE — Progress Notes (Signed)
[facility] Progress Note  8 Days Post-Op  Subjective: CC: abdominal pain Patient getting breathing treatment and only nodding to questions this AM. Nods yes when asked if she is having abdominal pain. Per RN patient is A&Ox3. Palliative meeting planned today.   Objective: Vital signs in last 24 hours: Temp:  [97.9 F (36.6 C)-99.3 F (37.4 C)] 97.9 F (36.6 C) (01/27 0755) Pulse Rate:  [100-126] 114 (01/27 0800) Resp:  [19-29] 19 (01/27 0800) BP: (139-159)/(49-81) 148/49 (01/27 0755) SpO2:  [96 %-98 %] 96 % (01/27 0800) Last BM Date: 09/04/18  Intake/Output from previous day: 01/26 0701 - 01/27 0700 In: 788.9 [I.V.:719.2; IV Piggyback:69.7] Out: 1420 [Urine:1350; Drains:20; Stool:50] Intake/Output this shift: Total I/O In: 843.3 [I.V.:497.5; IV Piggyback:345.8] Out: 250 [Urine:250]  PE: Gen:  Sleepy, moaning Card:  Tachycardic Resp: increased effort, breathing treatment in progress Abd: Soft, appropriately tender, non-distended, JP with ss drainage, stoma red with very small amount stool around, midline wound as below       Lab Results:  Recent Labs    09/07/18 0408 09/08/18 0505  WBC 4.7 2.5*  HGB 8.2* 7.1*  HCT 26.9* 23.8*  PLT 26* 20*   BMET Recent Labs    09/07/18 0408 09/08/18 0505  NA 149* 151*  K 3.7 3.3*  CL 112* 112*  CO2 30 32  GLUCOSE 256* 163*  BUN 21 21  CREATININE 0.62 0.58  CALCIUM 8.1* 8.4*   PT/INR Recent Labs    09/05/18 1930  LABPROT 15.3*  INR 1.22   CMP     Component Value Date/Time   NA 151 (H) 09/08/2018 0505   K 3.3 (L) 09/08/2018 0505   CL 112 (H) 09/08/2018 0505   CO2 32 09/08/2018 0505   GLUCOSE 163 (H) 09/08/2018 0505   BUN 21 09/08/2018 0505   CREATININE 0.58 09/08/2018 0505   CREATININE 0.78 08/15/2017 0902   CALCIUM 8.4 (L) 09/08/2018 0505   PROT 4.9 (L) 09/08/2018 0505   ALBUMIN 2.6 (L) 09/08/2018 0505   AST 50 (H) 09/08/2018 0505   ALT 207 (H) 09/08/2018 0505   ALKPHOS 46 09/08/2018  0505   BILITOT 1.1 09/08/2018 0505   GFRNONAA >60 09/08/2018 0505   GFRNONAA 78 08/15/2017 0902   GFRAA >60 09/08/2018 0505   GFRAA 90 08/15/2017 0902   Lipase     Component Value Date/Time   LIPASE 22 09/09/2018 0732       Studies/Results: Ct Head Wo Contrast  Result Date: 09/06/2018 CLINICAL DATA:  Encephalopathy EXAM: CT HEAD WITHOUT CONTRAST TECHNIQUE: Contiguous axial images were obtained from the base of the skull through the vertex without intravenous contrast. COMPARISON:  None. FINDINGS: Brain: There is no mass, hemorrhage or extra-axial collection. The size and configuration of the ventricles and extra-axial CSF spaces are normal. The brain parenchyma is normal, without acute or chronic infarction. Vascular: No abnormal hyperdensity of the major intracranial arteries or dural venous sinuses. No intracranial atherosclerosis. Skull: The visualized skull base, calvarium and extracranial soft tissues are normal. Sinuses/Orbits: No fluid levels or advanced mucosal thickening of the visualized paranasal sinuses. No mastoid or middle ear effusion. The orbits are normal. IMPRESSION: Normal aging brain. Electronically Signed   By: Ulyses Jarred M.D.   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 Chronic thrombocytopenia. Clinical concern for pulmonary embolism. EXAM: CT ANGIOGRAPHY CHEST WITH CONTRAST TECHNIQUE: Multidetector CT  imaging of the chest was performed using the standard protocol during bolus administration of intravenous contrast. Multiplanar CT image reconstructions and MIPs were obtained to evaluate the vascular anatomy. CONTRAST:  190mL ISOVUE-370 IOPAMIDOL (ISOVUE-370) INJECTION 76% COMPARISON:  Portable chest dated 09/01/2018. Chest CTA dated 07/23/2018. FINDINGS: Cardiovascular: Satisfactory opacification of the pulmonary arteries to the segmental level. No evidence  of pulmonary embolism. Normal heart size. No pericardial effusion. Mild atheromatous aortic calcifications. Mediastinum/Nodes: No enlarged mediastinal, right hilar, or axillary lymph nodes. Thyroid gland, trachea, and esophagus demonstrate no significant findings. Lungs/Pleura: Moderate-sized left pleural effusion, increased. Irregular left upper lobe mass with 2 confluent components, measuring a combined 3.6 x 3.0 cm on image number 40 series 6, previously 3.0 x 1.5 cm. This continues to extend to the superior left hilum. More superiorly in the left upper lobe, there is a 10 mm nodule on image number 29 series 6, previously 5 mm. In the anterior aspect of the left upper lobe, there is a 1.6 x 1.6 cm nodule on image number 27 series 6, previously 6 mm. There is also interval confluent soft tissue density encasing the posterior left hilum, including the left lower lobe bronchi and pulmonary arteries. This is causing moderate to marked bronchial narrowing. Interval small right upper lobe nodule measuring 6 mm on image number 38 series 6. Interval two adjacent right lower lobe nodules, 1 measuring 11 mm and the other measuring 6 mm on image number 66 series 6. Mild left lower lobe and left upper lobe atelectasis is noted. Upper Abdomen: Cholecystectomy clips. Normal appearing adrenal glands. No visible liver masses. Musculoskeletal: Unremarkable bones. Review of the MIP images confirms the above findings. IMPRESSION: 1. No pulmonary emboli. 2. Interval increase in size of the left upper lobe mass, as described above, compatible with the patient's known primary lung carcinoma. 3. Interval increase hilar invasion of the left lung mass with a significant new component encasing the left lower lobe bronchi and arteries with moderate to marked bronchial narrowing. 4. Progressive metastatic disease in the left upper lobe with interval metastatic disease in the right upper lobe and right lower lobe. 5. Moderate-sized left  pleural effusion, increased. 6. Mild left lower lobe and left upper lobe atelectasis. Aortic Atherosclerosis (ICD10-I70.0). Electronically Signed   By: Claudie Revering M.D.   On: 09/07/2018 18:54   Ct Abdomen Pelvis W Contrast  Result Date: 09/07/2018 CLINICAL DATA:  Abdominal pain. Review of electronic records demonstrates colectomy 09/06/2018. EXAM: CT ABDOMEN AND PELVIS WITH CONTRAST TECHNIQUE: Multidetector CT imaging of the abdomen and pelvis was performed using the standard protocol following bolus administration of intravenous contrast. CONTRAST:  158mL OMNIPAQUE IOHEXOL 300 MG/ML  SOLN COMPARISON:  Preoperative CT 08/30/2018. PET-CT 11/13/2017 FINDINGS: Lower chest: Small to moderate left pleural effusion with adjacent compressive atelectasis in the left lower lobe. Trace right pleural effusion and right basilar atelectasis. Nodularity in the region of linear atelectasis in the right lower lobe spanning 9 mm was not present on recent prior exam and likely nodular atelectasis. Hepatobiliary: No focal liver abnormality is seen. Status post cholecystectomy. Stable biliary prominence from prior. Pancreas: No ductal dilatation or inflammation. Spleen: Normal in size without focal abnormality. Posterior splenic cleft, incidental. Adrenals/Urinary Tract: Normal adrenal glands. No hydronephrosis or perinephric edema. Homogeneous renal enhancement with symmetric excretion on delayed phase imaging. Urinary bladder is decompressed. Stomach/Bowel: Recent laparotomy with transverse colostomy in the left lower quadrant. Stapled off sigmoid colon in the pelvis is unremarkable. Surgical drain remains in place, tip  in the left pericolic gutter. Mild wall thickening of the ascending and proximal transverse colon. Small bowel in the central abdomen are dilated and fluid-filled with air-fluid levels, no abnormal gastric distension. Vascular/Lymphatic: Normal caliber abdominal aorta. No adenopathy. Reproductive: Status post  hysterectomy. No adnexal masses. Other: Post recent midline laparotomy. Small amount of free fluid in the left pericolic gutter and left upper quadrant. Patchy edema primarily in the left abdomen may represent an element of fat necrosis postop. Crescentic 4.3 cm fluid in the midline deep pelvis may represent fluid in the vagina or small pelvic fluid collection. No other focal fluid collection or abscess. No residual free air. Soft tissue edema in the bilateral flanks, right greater than left. Skin thickening and soft tissue edema about the lateral left hip. Musculoskeletal: Multilevel degenerative change throughout spine. Degenerative change in both hips. There are no acute or suspicious osseous abnormalities. IMPRESSION: 1. Post recent midline laparotomy with transverse colostomy in the left lower quadrant. Expected postsurgical changes small amount of free fluid in patchy edema in the left abdomen. 2. Crescentic 4.3 cm fluid collection in the midline deep pelvis may represent fluid in the vagina or small pelvic fluid collection, sterility indeterminate by imaging. No other focal fluid collection. 3. Dilated fluid-filled central small bowel suggesting postoperative ileus. 4. Mild wall thickening of the ascending and proximal transverse colon, reactive wall thickening versus colitis. 5. Small to moderate left pleural effusion with adjacent compressive atelectasis. Right lower lobe atelectasis is a slightly nodular configuration, but is new from prior exam and likely more focal atelectatic change. Attention to this at follow-up recommended given history of lung cancer. Electronically Signed   By: Keith Rake M.D.   On: 09/07/2018 01:04   Dg Chest Port 1 View  Result Date: 09/08/2018 CLINICAL DATA:  Respiratory failure. EXAM: PORTABLE CHEST 1 VIEW COMPARISON:  CT chest 09/07/2018 and one-view chest x-ray 09/01/2018 FINDINGS: A right-sided PICC line is been placed. The tip terminates in the distal SVC. Left  subclavian Port-A-Cath is stable. Left upper lobe mass is again noted. There is volume loss with elevation of left hemidiaphragm. Mild asymmetric left-sided edema is superimposed. IMPRESSION: 1. Left upper lobe mass. 2. Mild left-sided edema. 3. Interval placement of right-sided PICC line. Electronically Signed   By: San Morelle M.D.   On: 09/08/2018 07:56   [place] Ekg Site Rite  Result Date: 09/06/2018 If Site Rite image not attached, placement could not be confirmed due to current cardiac rhythm.   Anti-infectives: Anti-infectives (From admission, onward)   Start     Dose/Rate Route Frequency Ordered Stop   09/12/2018 1100  meropenem (MERREM) 1 g in sodium chloride 0.9 % 100 mL IVPB  Status:  Discontinued     1 g 200 mL/hr over 30 Minutes Intravenous Every 8 hours 09/06/2018 1010 09/05/18 1019       Assessment/Plan T2DM - A1c 8.5 HTN Severe COPD GERD Lung cancer with pneumonitis on steroids - per medicine, CTA shows increase in LUL mass and increase in hilar invasion of L mass  Perforated diverticulitis of sigmoid colon POD8, S/P ex-lap, partial colectomy with end colostomy 08/19/2018 Dr. Kieth Brightly - ileus - clears as long as patient is alert enough  - prn IV robaxin for pain control - JP and VAC with SS drainage, continue to monitor -continue VAC M/W/F - needs to mobilize and get out of bed   L pleural effusion - seen on multiple images, lasix per medicine Hypotension and tachycardia -hypotension resolved -still  tachy, CTA negative for PE, defer to medicine Elevated troponins- ?demand ischemia, improving, per medicine Thrombocytopenia- plts down-trending 20K today, down per medicine and heme Hypokalemia - 3.3, replace to goal of 4.0 in setting of ileus  FEN: ice chips/sips of clears from floor VTE: SCDs;chemical dvt on hold secondary to low plts ID: [id number] 1/19>1/24    LOS: 8 days    Brigid Re , [facility] 09/08/2018, 8:55  AM Pager: [phone] Consults: [phone] Mon-Fri 7:00 am-4:30 pm Sat-Sun 7:00 am-11:30 am

## 2018-09-08 NOTE — Care Management Important Message (Signed)
Important Message  Patient Details  Name: [name] MRN: [id number] Date of Birth: [birth year]   Medicare Important Message Given:  Yes    Tommy Medal 09/08/2018, 4:24 PM

## 2018-09-08 NOTE — Progress Notes (Signed)
Physical Therapy Treatment Patient Details Name: [name] MRN: [id number] DOB: 1946/05/02 Today's Date: 09/08/2018    History of Present Illness   [name] is a 73 y.o. 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  Evaluation in the emergency department including a CT of the abdomen pelvis showed a bowel perforation at the level of the descending colon and sigmoid colon with air tracking up to the left pelvis with surrounding mesenteric stranding. Pt is now s/p  exploratory laparotomy, partial colectomy with end colostomy on 09/04/2018. Hospital course complicated by acute on chronic thrombocytopenia.     PT Comments    Pt admitted with above. Pt remains to demonstrate both cognitive and physical deficits. Pt remains unable to hold a conversation and remains to require maxAx2 for all mobility. Pts HR into high 130's during session and RR up 40. Pt cont to be in increased pain during mobility limiting pt's tolerance and ability to actively participate. Cont to recommend ST-SNF upon d/c.  Follow Up Recommendations  SNF;Supervision/Assistance - 24 hour     Equipment Recommendations  Other (comment)    Recommendations for Other Services       Precautions / Restrictions Precautions Precautions: Fall Precaution Comments: jp drain, wound vac, colostomy  Restrictions Weight Bearing Restrictions: No    Mobility  Bed Mobility Overal bed mobility: Needs Assistance Bed Mobility: Rolling;Sidelying to Sit Rolling: Max assist;+2 for physical assistance;+2 for safety/equipment Sidelying to sit: Max assist;+2 for physical assistance   Sit to supine: Total assist;+2 for physical assistance   General bed mobility comments: with max verbal and tactile cues pt able to hold onto rail in attempt to assist with rolling to the L  and transferring to sitting EOB, pt grimacing with pain, pt dependent for LE mangement and trunk elevation  Transfers                 General transfer comment: unable to tolerate today  Ambulation/Gait                 Stairs             Wheelchair Mobility    Modified Rankin (Stroke Patients Only)       Balance Overall balance assessment: Needs assistance Sitting-balance support: Bilateral upper extremity supported Sitting balance-Leahy Scale: Poor Sitting balance - Comments: pt with retropulsion due to pain in abdomen, pt did tolerated sitting EOB without posterior support x 30 sec x 2 trials Postural control: Posterior lean                                  Cognition Arousal/Alertness: Lethargic;Awake/alert Behavior During Therapy: Flat affect Overall Cognitive Status: Impaired/Different from baseline Area of Impairment: Following commands;Awareness                       Following Commands: Follows one step commands inconsistently;Follows one step commands with increased time   Awareness: Intellectual   General Comments: pt with muffled speech, flat affect, unable to carry on a conversation      Exercises General Exercises - Lower Extremity Long Arc Quad: AROM;Both;5 reps;Seated(uanble to achieve full ROM)    General Comments        Pertinent Vitals/Pain Pain Assessment: Faces Faces Pain Scale: Hurts whole lot Pain  Location: abdomen Pain Descriptors / Indicators: Grimacing;Guarding;Moaning Pain Intervention(s): Monitored during session    Home Living                      Prior Function            PT Goals (current goals can now be found in the care plan section) Progress towards PT goals: Progressing toward goals    Frequency    Min 3X/week      PT Plan Current plan remains appropriate    Co-evaluation              AM-PAC PT "6 Clicks" Mobility   Outcome Measure  Help needed  turning from your back to your side while in a flat bed without using bedrails?: Total Help needed moving from lying on your back to sitting on the side of a flat bed without using bedrails?: Total Help needed moving to and from a bed to a chair (including a wheelchair)?: Total Help needed standing up from a chair using your arms (e.g., wheelchair or bedside chair)?: Total Help needed to walk in hospital room?: Total Help needed climbing 3-5 steps with a railing? : Total 6 Click Score: 6    End of Session Equipment Utilized During Treatment: Oxygen Activity Tolerance: Patient limited by pain Patient left: in bed;with call bell/phone within reach;with bed alarm set(in chair position) Nurse Communication: Mobility status PT Visit Diagnosis: Other abnormalities of gait and mobility (R26.89);Muscle weakness (generalized) (M62.81);Pain Pain - part of body: (abdoment)     Time: 2836-6294 PT Time Calculation (min) (ACUTE ONLY): 21 min  Charges:  $Therapeutic Activity: 8-22 mins                     Kittie Plater, PT, DPT Acute Rehabilitation Services Pager #: [phone] Office #: [phone]    Berline Lopes 09/08/2018, 1:45 PM

## 2018-09-08 NOTE — Consult Note (Signed)
Consultation Note Date: 09/08/2018   Patient Name: [name]  DOB: 04/26/1946  MRN: [id number]  Age / Sex: 73 y.o., female  PCP: Fayrene Helper, MD Referring Physician: Bonnell Public, MD  Reason for Consultation: Establishing goals of care  HPI/Patient Profile:  73 year old female with history of COPD, stage III squamous cell lung cancer who has been following Dr. Delton Coombes on chemotherapy as outpatient, chronic thrombocytopenia, anxiety/depression, diabetes mellitus, diastolic CHF on Lasix admitted on January 19 with abdominal pain/diarrhea.  CT abdomen pelvis showed perforated diverticulitis. 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 Chest CT performed yesterday with worsening cancer.   Clinical Assessment and Goals of Care: Patient is resting in bed. She initially answered yes to all questions asked, even those that should be "no". She was able to state the year and president, and that she has 2 children. She was confused with other questions. She asked if we could stop talking.   Husband Herbie [place] and son Coralyn Mark is at bedside. Spoke with them. Husband is not very talkative. They state prior to this hospitalization, Mrs. Mulvihill was independent except for using a wheelchair for long distances due to SOB. Her appetite has been poor.    We discussed her diagnosis, prognosis, GOC, EOL wishes disposition and options.   Son Coralyn Mark states she stated in [place] that she would want chest compressions if her heart stopped. He states the family does not want her to suffer, but would like to treat the treatable as long as she is comfortable and is improving. If she is only surviving or is declining, they will consider comfort care. He states he understands the tenuousness of her situation.   Attending team MD spoke with family during my visit about her CT scan with worsening cancer. They  would like to speak with oncology. I called Dr. Delton Coombes who will reach out to family to discuss. Family meeting tomorrow morning at 8:30.     SUMMARY OF RECOMMENDATIONS   Family meeting tomorrow at 8:30.    Code Status/Advance Care Planning:  Full code  Prognosis:   < 6 months  Discharge Planning: To Be Determined      Primary Diagnoses: Present on Admission: . Diverticulitis of colon with perforation . Squamous cell lung cancer, left ([place]) . Morbid obesity ([place]) . COPD (chronic obstructive pulmonary disease) ([place]) . Anxiety and depression   I have reviewed the medical record, interviewed the patient and family, and examined the patient. The following aspects are pertinent.  Past Medical History:  Diagnosis Date  . Anxiety   . Arthritis   . Bipolar disorder ([place])   . Chronic back pain   . Chronic dyspnea   . COPD (chronic obstructive pulmonary disease) ([place])   . Diverticulosis   . Family history of lung cancer   . Family history of prostate cancer   . Family history of stomach cancer   . 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  Frequent falls   . Gait instability   . GERD (gastroesophageal reflux disease) 2000  . Hyperlipidemia 1995  . Hypertension 1995  . Nicotine addiction   . Obesity    Social History   Socioeconomic History  . Marital status: Married    Spouse name: Not on file  . Number of children: Not on file  . Years of education: Not on file  . Highest education level: Not on file  Occupational History  . 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 Financial resource strain: Not hard at all  . Food insecurity:    Worry: Never true    Inability: Never true  . Transportation needs:    Medical: No    Non-medical: No  Tobacco Use  . Smoking status: Former Smoker    Packs/day: 1.50    Years: 54.00    Pack years: 81.00    Types: Cigarettes    Last attempt  to quit: 08/20/2017    Years since quitting: 1.0  . Smokeless tobacco: Never Used  Substance and Sexual Activity  . Alcohol use: No    Alcohol/week: 0.0 standard drinks  . Drug use: No  . Sexual activity: Not Currently  Lifestyle  . Physical activity:    Days per week: 0 days    Minutes per session: 0 min  . Stress: Only a little  Relationships  . Social connections:    Talks on phone: More than three times a week    Gets together: More than three times a week    Attends religious service: More than 4 times per year    Active member of club or organization: No    Attends meetings of clubs or organizations: Never    Relationship status: Married  Other Topics Concern  . Not on file  Social History Narrative   Pt had 2 stillborns    Family History  Problem Relation Age of Onset  . Heart disease Mother        enlarged  . Diabetes Mother   . Hypertension Mother   . Cancer Father        throat cancer - smoker  . Hypertension Father   . Coronary artery disease Father   . Diabetes Sister        x3  . Asthma Sister   . Lung cancer Sister 90       d. 41; smoker  . Kidney disease Brother   . Stomach cancer Sister        dx in her 79s-50s  . Stroke Brother   . Prostate cancer Brother 52       pat 1/2 brother  . Thyroid cancer Maternal Aunt        dx and d. in her 60s  . Bone cancer Maternal Uncle   . Bone cancer Maternal Grandfather   . Cancer Cousin        NOS - had a swollen stomach   Scheduled Meds: . arformoterol  15 mcg Nebulization BID  . budesonide (PULMICORT) nebulizer solution  0.5 mg Nebulization BID  . chlorhexidine  15 mL Mouth Rinse BID  . furosemide  40 mg Intravenous Q12H  . hydrocortisone sod succinate (SOLU-CORTEF) inj  50 mg Intravenous Q12H  . insulin aspart  0-15 Units Subcutaneous Q4H  . ipratropium-albuterol  3 mL Nebulization BID  . mouth rinse  15 mL Mouth Rinse q12n4p  . metoprolol tartrate  5 mg  Intravenous Q6H  . omeprazole  40 mg Oral  Daily  . sodium chloride flush  10-40 mL Intracatheter Q12H   Continuous Infusions: . sodium chloride Stopped (09/08/18 1230)  . albumin human 25 g (09/08/18 1214)  . lactated ringers 30 mL/hr at 09/08/18 1306  . lactated ringers    . methocarbamol (ROBAXIN) IV    . potassium chloride 10 mEq (09/08/18 1411)  . TPN ADULT (ION) 30 mL/hr at 09/08/18 1300  . TPN ADULT (ION)     PRN Meds:.sodium chloride, acetaminophen **OR** acetaminophen, albuterol, methocarbamol (ROBAXIN) IV, morphine injection, ondansetron **OR** ondansetron (ZOFRAN) IV, sodium chloride flush Medications Prior to Admission:  Prior to Admission medications   Medication Sig Start Date End Date Taking? Authorizing Provider  albuterol (PROAIR HFA) 108 (90 Base) MCG/ACT inhaler INHALE 2 PUFFS BY MOUTH EVERY 6 HOURS IF NEEDED Patient taking differently: Inhale 2 puffs into the lungs every 6 (six) hours as needed for wheezing or shortness of breath.  03/18/18  Yes Fayrene Helper, MD  albuterol (PROVENTIL) (2.5 MG/3ML) 0.083% nebulizer solution USE 1 VIAL IN NEBULIZER EVERY 6 HOURS AS NEEDED. 07/25/18  Yes Fayrene Helper, MD  ALPRAZolam Duanne Moron) 1 MG tablet Take 1 tablet (1 mg total) by mouth 2 (two) times daily as needed for anxiety. 08/08/18 08/08/19 Yes Fayrene Helper, MD  aspirin (ASPIRIN LOW DOSE) 81 MG EC tablet Take 81 mg by mouth daily.     Yes [provider]  Cholecalciferol (VITAMIN D3) 50 MCG (2000 UT) capsule Take 2,000 Units by mouth daily.   Yes [provider]  diltiazem (CARDIZEM CD) 120 MG 24 hr capsule Take 1 capsule (120 mg total) by mouth every morning. 07/25/18  Yes Johnson, Clanford L, MD  diphenhydrAMINE (BENADRYL) 25 mg capsule Take 25-50 mg by mouth at bedtime as needed for sleep.   Yes [provider]  docusate sodium (COLACE) 100 MG capsule Take 100-200 mg by mouth 2 (two) times daily as needed for mild constipation or moderate constipation.    Yes [provider]  FLUoxetine (PROZAC) 20 MG capsule Take 1 capsule (20 mg total) by mouth every morning. 07/25/18  Yes Johnson, Clanford L, MD  guaiFENesin (MUCINEX) 600 MG 12 hr tablet Take 1 tablet (600 mg total) by mouth 2 (two) times daily. 11/01/17  Yes Kathie Dike, MD  Insulin Glargine (LANTUS) 100 UNIT/ML Solostar Pen Inject 50 Units into the skin at bedtime. 08/11/18  Yes Memon, Jolaine Artist, MD  ipratropium (ATROVENT) 0.02 % nebulizer solution INHALE ONE VIAL VIA NEBULIZER EVERY 6 HOURS AS NEEDED. Patient taking differently: Take 0.5 mg by nebulization every 6 (six) hours as needed for wheezing or shortness of breath.  07/25/18  Yes Fayrene Helper, MD  lactulose (CHRONULAC) 10 GM/15ML solution Take 15-47ml at bedtime every night to assist with moving bowels.  Titrate down if having multiple bowel movements. Patient taking differently: Take 10 g by mouth 2 (two) times daily as needed for mild constipation. Take 15-7ml at bedtime every night to assist with moving bowels.  Titrate down if having multiple bowel movements. 03/05/18  Yes Derek Jack, MD  lidocaine-prilocaine (EMLA) cream Apply a quarter size amount to port site 1 hour prior to chemo. Do not rub in. Cover with plastic wrap. 01/22/18  Yes Derek Jack, MD  lisinopril (PRINIVIL,ZESTRIL) 10 MG tablet Take 1 tablet (10 mg total) by mouth every morning. 07/25/18  Yes Johnson, Clanford L, MD  loperamide (IMODIUM) 2 MG capsule Take 2  mg by mouth as needed for diarrhea or loose stools.   Yes [provider]  metFORMIN (GLUCOPHAGE) 1000 MG tablet Take 1 tablet (1,000 mg total) by mouth 2 (two) times daily with a meal. 08/20/18  Yes Fayrene Helper, MD  omeprazole (PRILOSEC) 40 MG capsule Take 1 capsule (40 mg total) by mouth every morning. 07/25/18  Yes Johnson, Clanford L, MD  predniSONE (DELTASONE) 20 MG tablet Take 4 tablets (80 mg total) by mouth 2 (two) times daily with a meal. Patient taking differently:  Take 40 mg by mouth 2 (two) times daily with a meal.  08/14/18  Yes Lockamy, Randi L, NP-C  prochlorperazine (COMPAZINE) 10 MG tablet Take 10 mg by mouth every 6 (six) hours as needed for nausea or vomiting.   Yes [provider]  rosuvastatin (CRESTOR) 20 MG tablet Take 1 tablet (20 mg total) by mouth at bedtime. 07/25/18  Yes Johnson, Clanford L, MD  sulfamethoxazole-trimethoprim (BACTRIM DS,SEPTRA DS) 800-160 MG tablet Take 1 tablet by mouth 3 (three) times a week. Patient taking differently: Take 1 tablet by mouth 3 (three) times a week. Take 1 tablet Monday, Wednesday, Friday. 07/30/18  Yes Lockamy, Randi L, NP-C  SYMBICORT 80-4.5 MCG/ACT inhaler INHALE 2 PUFFS BY MOUTH TWICE DAILY( RINSE MOUTH AFTER USE) Patient taking differently: Inhale 2 puffs into the lungs 2 (two) times daily.  05/06/18  Yes Fayrene Helper, MD  tiZANidine (ZANAFLEX) 4 MG tablet TAKE 1 TABLET(4 MG) BY MOUTH THREE TIMES DAILY Patient taking differently: Take 4 mg by mouth 3 (three) times daily.  08/04/18  Yes Fayrene Helper, MD  celecoxib (CELEBREX) 400 MG capsule Take 400 mg by mouth daily.  08/04/18   [provider]  furosemide (LASIX) 20 MG tablet Take 2 tablets (40 mg total) by mouth every morning. Patient not taking: Reported on 09/08/2018 07/28/18   Irwin Brakeman L, MD  potassium chloride SA (K-DUR,KLOR-CON) 20 MEQ tablet Take 1 tablet (20 mEq total) by mouth every morning. Patient not taking: Reported on 09/08/2018 07/28/18   Murlean Iba, MD   Allergies  Allergen Reactions  . Bee Venom Anaphylaxis  . Penicillins Itching    Blisters in mouth after dental work Has patient had a PCN reaction causing immediate rash, facial/tongue/throat swelling, SOB or lightheadedness with hypotension: no Has patient had a PCN reaction causing severe rash involving mucus membranes or skin necrosis: No Has patient had a PCN reaction that required hospitalization No Has patient had a PCN reaction  occurring within the last 10 years: Yes If all of the above answers are "NO", then may proceed with Cephalosporin use.   . Codeine   . Motrin [Ibuprofen]     "messes" with her head.   Review of Systems  Gastrointestinal: Positive for nausea.    Physical Exam Pulmonary:     Effort: Pulmonary effort is normal.  Skin:    General: Skin is warm and dry.     Vital Signs: BP 124/83   Pulse (!) 122   Temp 97.9 F (36.6 C)   Resp (!) 26   Ht 5\' 3"  (1.6 m)   Wt 106.5 kg   SpO2 98%   BMI 41.59 kg/m  Pain Scale: Faces   Pain Score: Asleep   SpO2: SpO2: 98 % O2 Device:SpO2: 98 % O2 Flow Rate: .O2 Flow Rate (L/min): 2 L/min  IO: Intake/output summary:   Intake/Output Summary (Last 24 hours) at 09/08/2018 1448 Last data filed at 09/08/2018 1300 Gross  per 24 hour  Intake 1452.99 ml  Output 1835 ml  Net -382.01 ml    LBM: Last BM Date: 09/04/18 Baseline Weight: Weight: 99.2 kg Most recent weight: Weight: 106.5 kg     Palliative Assessment/Data:   Flowsheet Rows     Most Recent Value  Intake Tab  Referral Department  Hospitalist  Unit at Time of Referral  Intermediate Care Unit  Palliative Care Primary Diagnosis  Cancer  Date Notified  09/07/18  Palliative Care Type  New Palliative care  Reason for referral  Clarify Goals of Care  Date of Admission  09/07/2018  # of days IP prior to Palliative referral  7  Clinical Assessment  Psychosocial & Spiritual Assessment  Palliative Care Outcomes      Time In: 1:00 Time Out: 3:10 Time Total: 2 hours 10 min Greater than 50%  of this time was spent counseling and coordinating care related to the above assessment and plan.  Signed by: Asencion Gowda, NP   Please contact Palliative Medicine Team phone at [phone] for questions and concerns.  For individual provider: See Shea Evans

## 2018-09-08 NOTE — Progress Notes (Signed)
ONCOLOGY I reviewed the notes from palliative care. I also reviewed the CT of the chest. There is clear-cut progression of disease in spite off immunotherapy treatments. It appears that Dr. Delton Coombes will be discussing with the family on the phone about patient's goals of care. Greatly appreciate palliative care seeing the patient and helping with this decision making process. Since Dr. Delton Coombes and palliative care are assisting the patient and family, I will be available if there are any questions or concerns.

## 2018-09-08 NOTE — Progress Notes (Signed)
PROGRESS NOTE    [name]  FGH:[id number]  DOB: [birth year]  DOA: 09/03/2018 PCP: Fayrene Helper, MD  Brief Narrative:   73 year old female with history of COPD, stage III squamous cell lung cancer who has been following Dr. Delton Coombes on chemotherapy as outpatient, chronic thrombocytopenia, anxiety/depression, diabetes mellitus, diastolic CHF on Lasix admitted on January 19 with abdominal pain/diarrhea.  CT abdomen pelvis showed perforated diverticulitis at the level of descending colon/sigmoid colon with air tracking up to left pelvis.  Patient seen by general surgery and underwent colectomy/colostomy. Chest x-ray reported mid left lung infiltrate: Atelectasis versus pneumonia.  Patient also treated with high-dose steroids during the hospital course.  Cortisol level however was 25.5 on admission.  (Patient on chronic prednisone 40 mg twice daily and Bactrim 3 times a week).  Hospital course complicated by acute on chronic thrombocytopenia.  She has received blood transfusions and plasma transfusion.  Seen by hematology for thrombocytopenia.  No DIC as per hematologist.  09/07/2018: Patient seen alongside patient's husband, son, daughter-in-law, Tourist information centre manager.  Patient remains sleepy but easily arousable.  Patient follows command.  Low albumin is noted (1.4).  CO2 of 30 is noted.  Patient is morbidly obese, and snores.  However, no objectively confirmed OSA as per patient's husband.  Highly elevated sodium of 149 is noted.  The same is true 0.7 and magnesium is 1.8.  Albumin is only 5.7.  Will start patient on IV albumin 25 g every 6 hourly x8 doses.  We will also gave IV Lasix 40 Mg every 12 hours x4 doses.  Will monitor labs in the morning.  09/08/2018: Patient seen alongside patient's husband.  Discussed with the patient's son alongside palliative care team.  CT Angie chest findings were discussed.  Patient is more awake today and and interactive, though, now back to normal.  Patient  is able to follow commands today.  Worsening sodium level.  Potassium is 3.3.  Tried contacting oncologist team to discuss CT angios chest findings that was not successful.  We will continue to try.  Subjective: Patient is more responsive today, will follow simple command.  Objective: Vitals:   09/08/18 1300 09/08/18 1400 09/08/18 1500 09/08/18 1600  BP:      Pulse: (!) 120 (!) 117 (!) 118 (!) 117  Resp: (!) 27 (!) 30 (!) 22 20  Temp:      TempSrc:      SpO2: 98% 97% 100% 100%  Weight:      Height:        Intake/Output Summary (Last 24 hours) at 09/08/2018 1649 Last data filed at 09/08/2018 1300 Gross per 24 hour  Intake 1452.99 ml  Output 1835 ml  Net -382.01 ml   Filed Weights   08/25/2018 1350 08/22/2018 2110  Weight: 99.2 kg 106.5 kg    Physical Examination:  General exam: Patient is morbidly obese.  More awake today.  Follows simple command.  Respiratory system: Decreased air entry globally.   Cardiovascular system: S1 & S2 heard.  Gastrointestinal system: Abdomen is morbidly obese, drains noted, ostomy bag in place ventral abdominal wound is dressed.  Organs are difficult to assess.  Central nervous system: Sleepy/somnolent.   Data Reviewed: I have personally reviewed following labs and imaging studies  CBC: Recent Labs  Lab 09/03/18 0625 09/05/18 0500 09/05/18 1930 09/06/18 0512 09/07/18 0408 09/08/18 0505  WBC 4.1 3.3*  --  4.0 4.7 2.5*  NEUTROABS 2.4 2.7  --  3.4 4.0 1.9  HGB 8.5* 7.8*  --  7.9* 8.2* 7.1*  HCT 26.5* 25.0*  --  25.5* 26.9* 23.8*  MCV 87.2 90.3  --  91.4 90.6 92.6  PLT 34* 17* 34* 30* 26* 20*   Basic Metabolic Panel: Recent Labs  Lab 09/02/18 0318 09/03/18 0625 09/04/18 0435 09/06/18 1120 09/07/18 0408 09/08/18 0505  NA 137 141 144 146* 149* 151*  K 4.5 3.7 3.5 3.6 3.7 3.3*  CL 109 111 111 112* 112* 112*  CO2 18* 24 24 26 30  32  GLUCOSE 113* 134* 164* 298* 256* 163*  BUN 32* 27* 23 21 21 21   CREATININE 1.29* 0.84 0.65 0.58 0.62  0.58  CALCIUM 7.4* 7.4* 7.4* 7.7* 8.1* 8.4*  MG 2.1 2.2  --  1.6* 1.8 1.9  PHOS 4.8*  --   --  2.3* 3.4 3.5   GFR: Estimated Creatinine Clearance: 76.4 mL/min (by C-G formula based on SCr of 0.58 mg/dL). Liver Function Tests: Recent Labs  Lab 09/07/18 0408 09/08/18 0505  AST 63* 50*  ALT 419* 207*  ALKPHOS 57 46  BILITOT 1.0 1.1  PROT 4.4* 4.9*  ALBUMIN 1.4* 2.6*   No results for input(s): LIPASE, AMYLASE in the last 168 hours. No results for input(s): AMMONIA in the last 168 hours. Coagulation Profile: Recent Labs  Lab 09/02/18 1203 09/05/18 1930  INR 1.57 1.22   Cardiac Enzymes: Recent Labs  Lab 09/01/18 1840 09/01/18 2138 09/02/18 0318 09/02/18 0946  TROPONINI 0.08* 0.06* 0.06* 0.05*   BNP (last 3 results) No results for input(s): PROBNP in the last 8760 hours. HbA1C: No results for input(s): HGBA1C in the last 72 hours. CBG: Recent Labs  Lab 09/07/18 1946 09/07/18 2336 09/08/18 0410 09/08/18 0719 09/08/18 1217  GLUCAP 207* 159* 148* 149* 133*   Lipid Profile: Recent Labs    09/07/18 0408 09/08/18 0548  TRIG 99 95   Thyroid Function Tests: No results for input(s): TSH, T4TOTAL, FREET4, T3FREE, THYROIDAB in the last 72 hours. Anemia Panel: No results for input(s): VITAMINB12, FOLATE, FERRITIN, TIBC, IRON, RETICCTPCT in the last 72 hours. Sepsis Labs: Recent Labs  Lab 09/01/18 1820 09/01/18 2138  LATICACIDVEN 3.4* 2.4*    Recent Results (from the past 240 hour(s))  Urine culture     Status: Abnormal   Collection Time: 08/13/2018  8:00 AM  Result Value Ref Range Status   Specimen Description URINE, CLEAN CATCH  Final   Special Requests   Final    NONE Performed at [facility], [address], [place], [place] [address]    Culture (A)  Final    50,000 COLONIES/mL MULTIPLE SPECIES PRESENT, SUGGEST RECOLLECTION   Report Status 09/01/2018 FINAL  Final  Culture, blood (routine x 2)     Status: None   Collection Time: 08/30/2018 10:05 PM    Result Value Ref Range Status   Specimen Description BLOOD RIGHT HAND  Final   Special Requests   Final    BOTTLES DRAWN AEROBIC ONLY Blood Culture adequate volume   Culture   Final    NO GROWTH 5 DAYS Performed at [facility], [place] [address] [place], [place] [address]    Report Status 09/05/2018 FINAL  Final  Culture, blood (routine x 2)     Status: None   Collection Time: 08/25/2018 10:05 PM  Result Value Ref Range Status   Specimen Description BLOOD LEFT HAND  Final   Special Requests   Final    BOTTLES DRAWN AEROBIC ONLY Blood Culture results may not be optimal due  to an inadequate volume of blood received in culture bottles   Culture   Final    NO GROWTH 5 DAYS Performed at [facility], [place] [address] [place], [place] [address]    Report Status 09/05/2018 FINAL  Final      Radiology Studies: Ct Head Wo Contrast  Result Date: 09/06/2018 CLINICAL DATA:  Encephalopathy EXAM: CT HEAD WITHOUT CONTRAST TECHNIQUE: Contiguous axial images were obtained from the base of the skull through the vertex without intravenous contrast. COMPARISON:  None. FINDINGS: Brain: There is no mass, hemorrhage or extra-axial collection. The size and configuration of the ventricles and extra-axial CSF spaces are normal. The brain parenchyma is normal, without acute or chronic infarction. Vascular: No abnormal hyperdensity of the major intracranial arteries or dural venous sinuses. No intracranial atherosclerosis. Skull: The visualized skull base, calvarium and extracranial soft tissues are normal. Sinuses/Orbits: No fluid levels or advanced mucosal thickening of the visualized paranasal sinuses. No mastoid or middle ear effusion. The orbits are normal. IMPRESSION: Normal aging brain. Electronically Signed   By: Ulyses Jarred M.D.   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 Chronic  thrombocytopenia. Clinical concern for pulmonary embolism. EXAM: CT ANGIOGRAPHY CHEST WITH CONTRAST TECHNIQUE: Multidetector CT imaging of the chest was performed using the standard protocol during bolus administration of intravenous contrast. Multiplanar CT image reconstructions and MIPs were obtained to evaluate the vascular anatomy. CONTRAST:  164mL ISOVUE-370 IOPAMIDOL (ISOVUE-370) INJECTION 76% COMPARISON:  Portable chest dated 09/01/2018. Chest CTA dated 07/23/2018. FINDINGS: Cardiovascular: Satisfactory opacification of the pulmonary arteries to the segmental level. No evidence of pulmonary embolism. Normal heart size. No pericardial effusion. Mild atheromatous aortic calcifications. Mediastinum/Nodes: No enlarged mediastinal, right hilar, or axillary lymph nodes. Thyroid gland, trachea, and esophagus demonstrate no significant findings. Lungs/Pleura: Moderate-sized left pleural effusion, increased. Irregular left upper lobe mass with 2 confluent components, measuring a combined 3.6 x 3.0 cm on image number 40 series 6, previously 3.0 x 1.5 cm. This continues to extend to the superior left hilum. More superiorly in the left upper lobe, there is a 10 mm nodule on image number 29 series 6, previously 5 mm. In the anterior aspect of the left upper lobe, there is a 1.6 x 1.6 cm nodule on image number 27 series 6, previously 6 mm. There is also interval confluent soft tissue density encasing the posterior left hilum, including the left lower lobe bronchi and pulmonary arteries. This is causing moderate to marked bronchial narrowing. Interval small right upper lobe nodule measuring 6 mm on image number 38 series 6. Interval two adjacent right lower lobe nodules, 1 measuring 11 mm and the other measuring 6 mm on image number 66 series 6. Mild left lower lobe and left upper lobe atelectasis is noted. Upper Abdomen: Cholecystectomy clips. Normal appearing adrenal glands. No visible liver masses. Musculoskeletal:  Unremarkable bones. Review of the MIP images confirms the above findings. IMPRESSION: 1. No pulmonary emboli. 2. Interval increase in size of the left upper lobe mass, as described above, compatible with the patient's known primary lung carcinoma. 3. Interval increase hilar invasion of the left lung mass with a significant new component encasing the left lower lobe bronchi and arteries with moderate to marked bronchial narrowing. 4. Progressive metastatic disease in the left upper lobe with interval metastatic disease in the right upper lobe and right lower lobe. 5. Moderate-sized  left pleural effusion, increased. 6. Mild left lower lobe and left upper lobe atelectasis. Aortic Atherosclerosis (ICD10-I70.0). Electronically Signed   By: Claudie Revering M.D.   On: 09/07/2018 18:54   Ct Abdomen Pelvis W Contrast  Result Date: 09/07/2018 CLINICAL DATA:  Abdominal pain. Review of electronic records demonstrates colectomy 08/30/2018. EXAM: CT ABDOMEN AND PELVIS WITH CONTRAST TECHNIQUE: Multidetector CT imaging of the abdomen and pelvis was performed using the standard protocol following bolus administration of intravenous contrast. CONTRAST:  175mL OMNIPAQUE IOHEXOL 300 MG/ML  SOLN COMPARISON:  Preoperative CT 08/30/2018. PET-CT 11/13/2017 FINDINGS: Lower chest: Small to moderate left pleural effusion with adjacent compressive atelectasis in the left lower lobe. Trace right pleural effusion and right basilar atelectasis. Nodularity in the region of linear atelectasis in the right lower lobe spanning 9 mm was not present on recent prior exam and likely nodular atelectasis. Hepatobiliary: No focal liver abnormality is seen. Status post cholecystectomy. Stable biliary prominence from prior. Pancreas: No ductal dilatation or inflammation. Spleen: Normal in size without focal abnormality. Posterior splenic cleft, incidental. Adrenals/Urinary Tract: Normal adrenal glands. No hydronephrosis or perinephric edema. Homogeneous  renal enhancement with symmetric excretion on delayed phase imaging. Urinary bladder is decompressed. Stomach/Bowel: Recent laparotomy with transverse colostomy in the left lower quadrant. Stapled off sigmoid colon in the pelvis is unremarkable. Surgical drain remains in place, tip in the left pericolic gutter. Mild wall thickening of the ascending and proximal transverse colon. Small bowel in the central abdomen are dilated and fluid-filled with air-fluid levels, no abnormal gastric distension. Vascular/Lymphatic: Normal caliber abdominal aorta. No adenopathy. Reproductive: Status post hysterectomy. No adnexal masses. Other: Post recent midline laparotomy. Small amount of free fluid in the left pericolic gutter and left upper quadrant. Patchy edema primarily in the left abdomen may represent an element of fat necrosis postop. Crescentic 4.3 cm fluid in the midline deep pelvis may represent fluid in the vagina or small pelvic fluid collection. No other focal fluid collection or abscess. No residual free air. Soft tissue edema in the bilateral flanks, right greater than left. Skin thickening and soft tissue edema about the lateral left hip. Musculoskeletal: Multilevel degenerative change throughout spine. Degenerative change in both hips. There are no acute or suspicious osseous abnormalities. IMPRESSION: 1. Post recent midline laparotomy with transverse colostomy in the left lower quadrant. Expected postsurgical changes small amount of free fluid in patchy edema in the left abdomen. 2. Crescentic 4.3 cm fluid collection in the midline deep pelvis may represent fluid in the vagina or small pelvic fluid collection, sterility indeterminate by imaging. No other focal fluid collection. 3. Dilated fluid-filled central small bowel suggesting postoperative ileus. 4. Mild wall thickening of the ascending and proximal transverse colon, reactive wall thickening versus colitis. 5. Small to moderate left pleural effusion with  adjacent compressive atelectasis. Right lower lobe atelectasis is a slightly nodular configuration, but is new from prior exam and likely more focal atelectatic change. Attention to this at follow-up recommended given history of lung cancer. Electronically Signed   By: Keith Rake M.D.   On: 09/07/2018 01:04   Dg Chest Port 1 View  Result Date: 09/08/2018 CLINICAL DATA:  Respiratory failure. EXAM: PORTABLE CHEST 1 VIEW COMPARISON:  CT chest 09/07/2018 and one-view chest x-ray 09/01/2018 FINDINGS: A right-sided PICC line is been placed. The tip terminates in the distal SVC. Left subclavian Port-A-Cath is stable. Left upper lobe mass is again noted. There is volume loss with elevation of left hemidiaphragm. Mild asymmetric left-sided edema is  superimposed. IMPRESSION: 1. Left upper lobe mass. 2. Mild left-sided edema. 3. Interval placement of right-sided PICC line. Electronically Signed   By: San Morelle M.D.   On: 09/08/2018 07:56        Scheduled Meds: . arformoterol  15 mcg Nebulization BID  . budesonide (PULMICORT) nebulizer solution  0.5 mg Nebulization BID  . chlorhexidine  15 mL Mouth Rinse BID  . furosemide  40 mg Intravenous Q12H  . hydrocortisone sod succinate (SOLU-CORTEF) inj  50 mg Intravenous Q12H  . insulin aspart  0-15 Units Subcutaneous Q4H  . ipratropium-albuterol  3 mL Nebulization BID  . mouth rinse  15 mL Mouth Rinse q12n4p  . metoprolol tartrate  5 mg Intravenous Q6H  . omeprazole  40 mg Oral Daily  . sodium chloride flush  10-40 mL Intracatheter Q12H   Continuous Infusions: . sodium chloride Stopped (09/08/18 1230)  . albumin human 25 g (09/08/18 1514)  . lactated ringers 30 mL/hr at 09/08/18 1306  . lactated ringers    . methocarbamol (ROBAXIN) IV    . potassium chloride 10 mEq (09/08/18 1516)  . TPN ADULT (ION) 30 mL/hr at 09/08/18 1300  . TPN ADULT (ION)      Assessment & Plan:    1.  Perforated diverticulitis with sepsis present on  admission:  Afebrile, no leukocytosis.  Lactate 6.4 on presentation, now improving and downtrending.  Status post surgery/colostomy/wound VAC in place.  General surgery concerned about low ostomy output.  N.p.o. for now and plan to start TPN tonight once PICC line access available.  Meropenem discontinued yesterday.  Discussed with general surgery who were concerned about possible intra-abdominal infection, will order CT abdomen pelvis with IV contrast only today. If concern for infection will treat with Rocephin/Flagyl and avoid meropenem and concern for thrombocytopenia. 09/07/2018: Continue current management. 09/08/2018: Surgical input is highly appreciated.  Surgical team is directing care.  2.  Altered mental status:  Likely combined toxic and metabolic.   Continue to manage primary process.   09/08/2018: Resolving.  Continue to monitor closely.  3.   Acute on chronic thrombocytopenia:  patient appears to be pancytopenic. Secondary to problem #1 versus medication induced (chemotherapy, meropenem, Protonix, Bactrim as reviewed by pharmacy). Received platelet transfusion on January 21.  Being followed by hematology, DIC work-up sent.  Another round of platelet transfusion ordered 1/24 .  Platelet count 19 ->34-> 19-->30.  She has not been on Bactrim while hospitalized.  Meropenem discontinued 1/24.  Protonix changed to omeprazole..  No active signs of bleeding currently.  Monitor with daily CBC 09/07/2018: Continue to monitor.  Platelet count is 26,000 today.  No bleeding. 09/08/2018: Platelet count is 20,000 today.  Continue to monitor closely.  4.  Acute hypoxic respiratory failure: Currently on 1 L O2 via nasal cannula.She does have underlying history of COPD and stage III squamous cell lung CA. she was started on high-dose steroids (prednisone 40 mg twice daily) as outpatient and concern for pneumonitis on CT. she was started  on stress dose steroids during ICU stay.  Tapering slowly as tolerated.   May need to taper to off rapidly if CT abdomen does show infection.  Chest x-ray on January 20 reported a left midlung infiltrate possibly pneumonia.T-max is 99.  No evidence of leukocytosis.status post 6 days IV meropenem.  Incentive spirometry as tolerated. 09/07/2018: ABG.  Suspect previously undiagnosed OSA/OHS.  Continued nebulizer treatment. 09/08/2018: Stable.  ABG noted.  5. Diastolic CHF, elevated troponin:  Downtrending troponin.  Likely demand ischemia.  On Cardizem at baseline which is on hold due to n.p.o. status.  Given persistent tachycardia added metoprolol today.  Underlying cause could be pain/infection.  On Lasix as outpatient, currently on hold as patient n.p.o. 09/07/2018: IV Lasix 40 mg every 12 hours x 4 doses.  Continue to assess and manage accordingly. 09/08/2018: Hold IV Lasix after completing current dosing regimen.  6.  AKI:  Creatinine peaked to 1.5 during hospital course and now normalized.  Normalized with IV fluids 09/07/2018: AKI has resolved.  7.  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 On stress dose IV steroids while here. Will start tapering and monitor blood pressure.   09/08/2018: CT angio chest revealed possible worsening disease.  Oncology is following patient already.  Attempt was made to discuss with oncology but not successful.  We will continue to try.  8. GERD:  IV Protonix changed to oral omeprazole  9. COPD: Continue neb treatments/inhaler therapy.  10: Hypernatremia: We  will check urine sodium, urine osmolality and serum osmolality.  Current diuresis (patient is on IV Lasix) make interpretation of above result difficult.   DVT prophylaxis: SCDs given thrombocytopenia Code Status: Full code Family / Patient Communication: Discussed with son who was present bedside.  He understands overall poor prognosis. Disposition Plan: May need skilled nursing facility.  PT evaluation when able to tolerate oral intake.  Palliative care consult for care goal discussions.    LOS: 8 days     Bonnell Public, MD [facility] Pager [phone]   If 7PM-7AM, please contact night-coverage [email or web address] Password [facility] 09/08/2018, 4:49 PM

## 2018-09-08 NOTE — Consult Note (Signed)
[place] Nurse wound follow up Wound type: Midline abdominal NPWT (VAC) dressing change with CCS provider at bedside.  Measurement:22cm x 9 cm x 6.3 cm (slightly larger dimensions) Wound bed: pale pink with necrotic adipose tissue (50% ) noted.  PA at bedside.  Drainage (amount, consistency, odor) Moderate serosanguinous  Musty odor Periwound: intact  Dressing procedure/placement/frequency: 1 piece black foam after cleansing and assessing wound. Covered with drape.  Seal is immediately achieved.   [place] Nurse ostomy follow up Stoma type/location: LUQ colostomy Stomal assessment/size:  1 3/4" cut off center due to proximity of midline wound.  Peristomal assessment: intact Treatment options for stomal/peristomal skin: none Output scant brown stool Ostomy pouching: 1pc.convex Education provided: none Enrolled patient in [employer] Discharge program: No Family agreeable to palliative meeting.  [place] team will follow.  Domenic Moras MSN, RN, FNP-BC CWON Wound, Ostomy, Continence Nurse Pager [phone]

## 2018-09-08 NOTE — Progress Notes (Signed)
PHARMACY - ADULT TOTAL PARENTERAL NUTRITION CONSULT NOTE   Pharmacy Consult:  TPN Indication:  Prolonged ileus  Patient Measurements: Height: '5\' 3"'  (160 cm) Weight: 234 lb 12.6 oz (106.5 kg) IBW/kg (Calculated) : 52.4 TPN AdjBW (KG): 65.9 Body mass index is 41.59 kg/m.  Assessment:  59 YOF presented on 08/21/2018 with abdominal pain and loose stools for 2 weeks that improved minimally with loperamide.  Found to have perforated diverticulitis and underwent ex-lap with partial colectomy, end colostomy and LoA on 09/12/2018.  Patient was started on a clear liquid diet since 09/04/18 and intake has been inadequate per charting.  Pharmacy consulted to manage TPN for post-op ileus.  Patient couldn't stay awake for an interview.  GI: hx GERD on PO Prilosec. Prealbumin remains low at 5.7. Albumin 25g IV Q6h x Body fluid draining around JP drain, O/P 51m. LBM 1/23 Endo: DM on metformin and Lantus 50/d PTA, A1c 8.5%.  CBGs 148-207 (also on HC 562mBID) Insulin requirements in the past 24 hours: 26 units + 30 units in TPN Lytes: high Na/CL, K down 3.7>3.3 (S/p K runs x 3 + receiving Lasix; goal >/= 4 for ileus), Mag 1.9 (s/p Mag sulfate 2g IV x 1; goal >/= 2 for ileus) Renal: SCr 0.58 stable, BUN WNL - UOP 0.5 ml/kg/hr, LR at 30 ml/hr, net +6.6L Pulm: COPD on 2L [place] - [place], Pulmicort, Duonebs Cards: HTN/HLD - CTA neg for PE. BP/HR elevated (in Afib) - Lopressor. Lasix 4029mV q12 x 4 doses Hepatobil: AST/ALT down to 50/207, alk phos / tbili / TG WNL Onc: SCC of left lung s/p immunotherapy - pancytopenia Neuro: Bipolar - PRN morphine, pain score 3 ID: [id number]/p Merrem - afebrile, WBC low 2.5 TPN Access: left chest port per Dr. CorBrantley StagePICC placed 09/06/18 TPN start date: 09/06/18  Nutritional Goals (RD rec on 1/25): 1650-1850 kCal and 115-130gm protein per day  Current Nutrition:  TPN Clears (as long as patient alert enough per Surgery)  Plan:  Increase TPN to 50 ml/hr (goal rate 80 ml/hr) TPN  provides 75g AA, 132g CHO and 32g ILE for a total of 1067 kCal, meeting ~65% of patient's needs Electrolytes in TPN: no Na, other lytes increased with rate increase; Cl:Ac 1:2 Daily multivitamin and trace elements in TPN Continue moderate SSI Q4H + increase regular insulin in TPN to 40 units with rate increase Reduce LR to 10 ml/hr at 1800 when new TPN bag hung KCL x 6 runs (continues on Lasix 25m7m q12h x 4 per MD - 2 doses remaining) Mag sulfate 2gm IV x 1 F/U AM labs, CBGs to further advance TPN  HaleElicia LamparmD, BCPS Please check AMION for all [facility] PHerschertact numbers Clinical Pharmacist 09/08/2018 8:45 AM

## 2018-09-09 DIAGNOSIS — E876 Hypokalemia: Secondary | ICD-10-CM

## 2018-09-09 LAB — RENAL FUNCTION PANEL
Albumin: 3.3 g/dL — ABNORMAL LOW (ref 3.5–5.0)
Albumin: 3.4 g/dL — ABNORMAL LOW (ref 3.5–5.0)
Anion gap: 12 (ref 5–15)
Anion gap: 11 (ref 5–15)
BUN: 22 mg/dL (ref 8–23)
BUN: 23 mg/dL (ref 8–23)
CO2: 34 mmol/L — ABNORMAL HIGH (ref 22–32)
CO2: 32 mmol/L (ref 22–32)
Calcium: 8.4 mg/dL — ABNORMAL LOW (ref 8.9–10.3)
Calcium: 8.7 mg/dL — ABNORMAL LOW (ref 8.9–10.3)
Chloride: 107 mmol/L (ref 98–111)
Chloride: 109 mmol/L (ref 98–111)
Creatinine, Ser: 0.61 mg/dL (ref 0.44–1.00)
Creatinine, Ser: 0.64 mg/dL (ref 0.44–1.00)
GFR calc Af Amer: >60 mL/min (ref >60)
GFR calc Af Amer: >60 mL/min (ref >60)
GFR calc non Af Amer: >60 mL/min (ref >60)
GFR calc non Af Amer: >60 mL/min (ref >60)
Glucose, Bld: 239 mg/dL — ABNORMAL HIGH (ref 70–99)
Glucose, Bld: 325 mg/dL — ABNORMAL HIGH (ref 70–99)
Phosphorus: 4.2 mg/dL (ref 2.5–4.6)
Phosphorus: 3.7 mg/dL (ref 2.5–4.6)
Potassium: 3 mmol/L — ABNORMAL LOW (ref 3.5–5.1)
Potassium: 3.8 mmol/L (ref 3.5–5.1)
Sodium: 153 mmol/L — ABNORMAL HIGH (ref 135–145)
Sodium: 152 mmol/L — ABNORMAL HIGH (ref 135–145)

## 2018-09-09 LAB — GLUCOSE, CAPILLARY
Glucose-Capillary: 204 mg/dL — ABNORMAL HIGH (ref 70–99)
Glucose-Capillary: 218 mg/dL — ABNORMAL HIGH (ref 70–99)
Glucose-Capillary: 220 mg/dL — ABNORMAL HIGH (ref 70–99)
Glucose-Capillary: 250 mg/dL — ABNORMAL HIGH (ref 70–99)
Glucose-Capillary: 269 mg/dL — ABNORMAL HIGH (ref 70–99)
Glucose-Capillary: 269 mg/dL — ABNORMAL HIGH (ref 70–99)
Glucose-Capillary: 274 mg/dL — ABNORMAL HIGH (ref 70–99)

## 2018-09-09 LAB — HEMOGLOBIN AND HEMATOCRIT, BLOOD
HCT: 34.4 % — ABNORMAL LOW (ref 36.0–46.0)
Hemoglobin: 10.5 g/dL — ABNORMAL LOW (ref 12.0–15.0)

## 2018-09-09 LAB — CBC WITH DIFFERENTIAL/PLATELET
Abs Immature Granulocytes: 0.01 10*3/uL (ref 0.00–0.07)
Basophils Absolute: 0 10*3/uL (ref 0.0–0.1)
Basophils Relative: 2 %
Eosinophils Absolute: 0 10*3/uL (ref 0.0–0.5)
Eosinophils Relative: 0 %
HEMATOCRIT: 20.8 % — AB (ref 36.0–46.0)
Hemoglobin: 6 g/dL — CL (ref 12.0–15.0)
Immature Granulocytes: 1 %
Lymphocytes Relative: 15 %
Lymphs Abs: 0.2 10*3/uL — ABNORMAL LOW (ref 0.7–4.0)
MCH: 27.4 pg (ref 26.0–34.0)
MCHC: 28.8 g/dL — ABNORMAL LOW (ref 30.0–36.0)
MCV: 95 fL (ref 80.0–100.0)
Monocytes Absolute: 0.1 10*3/uL (ref 0.1–1.0)
Monocytes Relative: 7 %
Neutro Abs: 0.9 10*3/uL — ABNORMAL LOW (ref 1.7–7.7)
Neutrophils Relative %: 75 %
Platelets: 16 10*3/uL — CL (ref 150–400)
RBC: 2.19 MIL/uL — ABNORMAL LOW (ref 3.87–5.11)
RDW: 18.6 % — ABNORMAL HIGH (ref 11.5–15.5)
WBC: 1.2 10*3/uL — CL (ref 4.0–10.5)
nRBC: 17.9 % — ABNORMAL HIGH (ref 0.0–0.2)

## 2018-09-09 LAB — PREPARE RBC (CROSSMATCH)

## 2018-09-09 LAB — MAGNESIUM: Magnesium: 1.9 mg/dL (ref 1.7–2.4)

## 2018-09-09 MED ORDER — POTASSIUM CHLORIDE 10 MEQ/50ML IV SOLN
10.0000 meq | INTRAVENOUS | Status: DC
  Filled 2018-09-09 (×6): qty 50

## 2018-09-09 MED ORDER — FUROSEMIDE 10 MG/ML IJ SOLN
20.0000 mg | Freq: Two times a day (BID) | INTRAMUSCULAR | Status: DC
  Administered 2018-09-09 – 2018-09-10 (×3): 20 mg via INTRAVENOUS
  Filled 2018-09-09 (×3): qty 2

## 2018-09-09 MED ORDER — SODIUM CHLORIDE 0.9% IV SOLUTION
Freq: Once | INTRAVENOUS | Status: AC
  Administered 2018-09-09: 10:00:00 via INTRAVENOUS

## 2018-09-09 MED ORDER — HYDROMORPHONE HCL 1 MG/ML IJ SOLN
1.0000 mg | INTRAMUSCULAR | Status: DC | PRN
  Administered 2018-09-09 – 2018-09-10 (×4): 1 mg via INTRAVENOUS
  Filled 2018-09-09 (×4): qty 1

## 2018-09-09 MED ORDER — TRAVASOL 10 % IV SOLN
INTRAVENOUS | Status: AC
  Administered 2018-09-09: 18:00:00 via INTRAVENOUS
  Filled 2018-09-09: qty 750

## 2018-09-09 MED ORDER — POTASSIUM CHLORIDE CRYS ER 20 MEQ PO TBCR: 40.0000 meq | EXTENDED_RELEASE_TABLET | ORAL | Status: DC

## 2018-09-09 MED ORDER — POTASSIUM CHLORIDE CRYS ER 20 MEQ PO TBCR
40.0000 meq | EXTENDED_RELEASE_TABLET | ORAL | Status: AC
  Administered 2018-09-09 (×2): 40 meq via ORAL
  Filled 2018-09-09 (×2): qty 2

## 2018-09-09 MED ORDER — MAGNESIUM SULFATE 2 GM/50ML IV SOLN
2.0000 g | Freq: Once | INTRAVENOUS | Status: DC
  Filled 2018-09-09: qty 50

## 2018-09-09 MED ORDER — PREDNISONE 20 MG PO TABS
40.0000 mg | ORAL_TABLET | Freq: Two times a day (BID) | ORAL | Status: DC
  Administered 2018-09-09 – 2018-09-10 (×3): 40 mg via ORAL
  Filled 2018-09-09 (×3): qty 2

## 2018-09-09 NOTE — Progress Notes (Signed)
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 Kieth Brightly -ileus as seen on CT 01/26, improving  - small fluid collection in deep pelvis uncertain if sterile vs abscess, possible in vagina - JP and VAC with SS drainage -continue VAC M/W/F - needs to mobilize and get out of bed  FEN: CLD VTE: SCDs;chemical dvt on hold secondary to low plts ID: meropenem 1/19>1/24 Follow up: Dr. Kieth Brightly  DISPO: CLD, cont TPN    LOS: 9 days    Subjective: CC: abdominal pain  No issues overnight. No nausea or vomiting. Tolerating clears from the floor.   Objective: Vital signs in last 24 hours: Temp:  [97.8 F (36.6 C)-100.6 F (38.1 C)] 97.9 F (36.6 C) (01/28 0712) Pulse Rate:  [104-125] 112 (01/28 0913) Resp:  [15-30] 19 (01/28 0913) BP: (121-156)/(57-83) 146/67 (01/28 0712) SpO2:  [97 %-100 %] 99 % (01/28 0915) Last BM Date: 09/04/18  Intake/Output from previous day: 01/27 0701 - 01/28 0700 In: 1818.7 [I.V.:1421.8; IV Piggyback:396.9] Out: 8416 [Urine:3975; Drains:15] Intake/Output this shift: Total I/O In: 50.8 [I.V.:50.8] Out: -   PE: Gen:  Alert, NAD, pleasant, cooperative Pulm:  Rate and effort normal Abd: Soft, obese, ND, hypoactive BS, wound vac in place of midline incision, JP drain with dark serosanguinous drainage, ostomy with small amount of brown liquid stool, generalized TTP with mild guarding, no peritonitis  Skin: warm and dry   Anti-infectives: Anti-infectives (From admission, onward)   Start     Dose/Rate Route Frequency Ordered Stop   08/16/2018 1100  meropenem (MERREM) 1 g in sodium chloride 0.9 % 100 mL IVPB  Status:  Discontinued     1 g 200 mL/hr over 30 Minutes Intravenous Every 8 hours 09/04/2018 1010 09/05/18 1019       Lab Results:  Recent Labs    09/08/18 0505 09/09/18 0500  WBC 2.5* 1.2*  HGB 7.1* 6.0*  HCT 23.8* 20.8*  PLT 20* 16*   BMET Recent Labs    09/08/18 0505 09/09/18 0500  NA 151* 153*  K 3.3* 3.0*  CL 112* 107  CO2 32 34*  GLUCOSE 163* 239*  BUN 21 22  CREATININE 0.58 0.61  CALCIUM 8.4* 8.4*   PT/INR No results for input(s): LABPROT, INR in the last 72 hours. CMP     Component Value Date/Time   NA 153 (H) 09/09/2018 0500   K 3.0 (L) 09/09/2018 0500   CL 107 09/09/2018 0500   CO2 34 (H) 09/09/2018 0500   GLUCOSE 239 (H) 09/09/2018 0500   BUN 22 09/09/2018 0500   CREATININE 0.61 09/09/2018 0500   CREATININE 0.78 08/15/2017 0902   CALCIUM 8.4 (L) 09/09/2018 0500   PROT 4.9 (L) 09/08/2018 0505   ALBUMIN 3.3 (L) 09/09/2018 0500   AST 50 (H) 09/08/2018 0505   ALT 207 (H) 09/08/2018 0505   ALKPHOS 46 09/08/2018 0505   BILITOT 1.1 09/08/2018 0505   GFRNONAA >60 09/09/2018 0500   GFRNONAA 78 08/15/2017 0902   GFRAA >60 09/09/2018 0500   GFRAA 90 08/15/2017 0902   Lipase     Component Value Date/Time   LIPASE 22 08/18/2018 0732    Studies/Results: Ct Angio Chest Pe W Or Wo Contrast  Result Date: 09/07/2018 CLINICAL DATA:  Undergoing chemotherapy for squamous cell lung cancer. Chronic thrombocytopenia.  Clinical concern for pulmonary embolism. EXAM: CT ANGIOGRAPHY CHEST WITH CONTRAST TECHNIQUE: Multidetector CT imaging of the chest was performed using the standard protocol during bolus administration of intravenous contrast. Multiplanar CT image reconstructions and MIPs were obtained to evaluate the vascular anatomy. CONTRAST:  1107mL ISOVUE-370 IOPAMIDOL (ISOVUE-370) INJECTION 76% COMPARISON:  Portable chest dated 09/01/2018. Chest CTA dated 07/23/2018. FINDINGS: Cardiovascular: Satisfactory opacification of the pulmonary arteries to the segmental level. No evidence of pulmonary embolism. Normal heart size. No pericardial effusion. Mild atheromatous aortic  calcifications. Mediastinum/Nodes: No enlarged mediastinal, right hilar, or axillary lymph nodes. Thyroid gland, trachea, and esophagus demonstrate no significant findings. Lungs/Pleura: Moderate-sized left pleural effusion, increased. Irregular left upper lobe mass with 2 confluent components, measuring a combined 3.6 x 3.0 cm on image number 40 series 6, previously 3.0 x 1.5 cm. This continues to extend to the superior left hilum. More superiorly in the left upper lobe, there is a 10 mm nodule on image number 29 series 6, previously 5 mm. In the anterior aspect of the left upper lobe, there is a 1.6 x 1.6 cm nodule on image number 27 series 6, previously 6 mm. There is also interval confluent soft tissue density encasing the posterior left hilum, including the left lower lobe bronchi and pulmonary arteries. This is causing moderate to marked bronchial narrowing. Interval small right upper lobe nodule measuring 6 mm on image number 38 series 6. Interval two adjacent right lower lobe nodules, 1 measuring 11 mm and the other measuring 6 mm on image number 66 series 6. Mild left lower lobe and left upper lobe atelectasis is noted. Upper Abdomen: Cholecystectomy clips. Normal appearing adrenal glands. No visible liver masses. Musculoskeletal: Unremarkable bones. Review of the MIP images confirms the above findings. IMPRESSION: 1. No pulmonary emboli. 2. Interval increase in size of the left upper lobe mass, as described above, compatible with the patient's known primary lung carcinoma. 3. Interval increase hilar invasion of the left lung mass with a significant new component encasing the left lower lobe bronchi and arteries with moderate to marked bronchial narrowing. 4. Progressive metastatic disease in the left upper lobe with interval metastatic disease in the right upper lobe and right lower lobe. 5. Moderate-sized left pleural effusion, increased. 6. Mild left lower lobe and left upper lobe atelectasis. Aortic  Atherosclerosis (ICD10-I70.0). Electronically Signed   By: Claudie Revering M.D.   On: 09/07/2018 18:54   Dg Chest Port 1 View  Result Date: 09/08/2018 CLINICAL DATA:  Respiratory failure. EXAM: PORTABLE CHEST 1 VIEW COMPARISON:  CT chest 09/07/2018 and one-view chest x-ray 09/01/2018 FINDINGS: A right-sided PICC line is been placed. The tip terminates in the distal SVC. Left subclavian Port-A-Cath is stable. Left upper lobe mass is again noted. There is volume loss with elevation of left hemidiaphragm. Mild asymmetric left-sided edema is superimposed. IMPRESSION: 1. Left upper lobe mass. 2. Mild left-sided edema. 3. Interval placement of right-sided PICC line. Electronically Signed   By: San Morelle M.D.   On: 09/08/2018 07:56      Kalman Drape , [facility] Surgery 09/09/2018, 10:02 AM  Pager: [phone] Mon-Wed, Friday 7:00am-4:30pm Thurs 7am-11:30am  Consults: [phone]

## 2018-09-09 NOTE — Social Work (Signed)
Aware of SNF recommendations, CSW continuing to follow for support with disposition when medically appropriate.  Westley Hummer, MSW, [place] Work [phone]

## 2018-09-09 NOTE — Progress Notes (Signed)
Pt constantly moaning in pain saying her stomach is killing her.  Pt's orientation is slightly limited but she was able to tell me her pain was still 10/10 pain, despite PRN morphine being given.  On call MD notified and new orders placed.

## 2018-09-09 NOTE — Progress Notes (Signed)
PROGRESS NOTE    [name]  UXN:[id number]  DOB: 27-Dec-1945  DOA: 08/27/2018 PCP: Fayrene Helper, MD  Brief Narrative:   Patient is a 73 year old female with past medical history significant for COPD, stage III squamous cell lung cancer who has been following Dr. Delton Coombes on chemotherapy as outpatient, chronic thrombocytopenia, anxiety/depression, diabetes mellitus, diastolic CHF on Lasix admitted on January 19 with abdominal pain/diarrhea.  CT abdomen pelvis showed perforated diverticulitis at the level of descending colon/sigmoid colon with air tracking up to left pelvis.  Patient was seen by general surgery and underwent colectomy/colostomy.  Postop.  Has been complicated by encephalopathy, respiratory insufficiency/failure, thrombocytopenia, anemia and pancytopenia noted today.  CT Angio of the chest done during this admission revealed possible progression of the lung cancer.  Patient is currently being followed by oncology, palliative care as well as the surgical team.  Patient has made some progress in terms of encephalopathy.  Patient is able to communicate, however, long-term prognosis remains poor.  As per palliative care team, patient's son still wants full code and active management of multiple comorbidities.  Hypernatremia and hypokalemia noted.  09/09/2018: IV Solu-Cortef discontinued today and prednisone taper started.  Patient will be transfused with 2 units of packed red blood cells on 1 unit of platelets today.  Potassium is being repleted.  Continue to monitor platelet count, night hemoglobin and electrolytes.  Hopefully, hypernatremia and hypokalemia, though most likely multifactorial, will start to improve with discontinuation of IV Solu-Cortef.  Long-term prognosis remains guarded.  Continue to discuss goal of care.  Subjective: Patient is more responsive and interactive today.  Patient is able to provide some history.  No new complaints.    Objective: Vitals:     09/09/18 1100 09/09/18 1200 09/09/18 1230 09/09/18 1400  BP:   133/62 (!) 158/77  Pulse: (!) 116 (!) 120 (!) 111 (!) 124  Resp: (!) 24 (!) 22 18 (!) 25  Temp:   98.7 F (37.1 C)   TempSrc:   Oral   SpO2: 97% 97% 98% 97%  Weight:      Height:        Intake/Output Summary (Last 24 hours) at 09/09/2018 1433 Last data filed at 09/09/2018 1300 Gross per 24 hour  Intake 1178.82 ml  Output 4375 ml  Net -3196.18 ml   Filed Weights   09/02/2018 1350 09/08/2018 2110  Weight: 99.2 kg 106.5 kg    Physical Examination:  General exam: Patient is morbidly obese.  More awake today.  Follows simple command.  Respiratory system: Decreased air entry globally.   Cardiovascular system: S1 & S2 heard.  Gastrointestinal system: Abdomen is morbidly obese, drains noted, ostomy bag in place ventral abdominal wound is dressed.  Organs are difficult to assess.  Central nervous system: Sleepy/somnolent.   Data Reviewed: I have personally reviewed following labs and imaging studies  CBC: Recent Labs  Lab 09/05/18 0500 09/05/18 1930 09/06/18 0512 09/07/18 0408 09/08/18 0505 09/09/18 0500  WBC 3.3*  --  4.0 4.7 2.5* 1.2*  NEUTROABS 2.7  --  3.4 4.0 1.9 0.9*  HGB 7.8*  --  7.9* 8.2* 7.1* 6.0*  HCT 25.0*  --  25.5* 26.9* 23.8* 20.8*  MCV 90.3  --  91.4 90.6 92.6 95.0  PLT 17* 34* 30* 26* 20* 16*   Basic Metabolic Panel: Recent Labs  Lab 09/03/18 0625 09/04/18 0435 09/06/18 1120 09/07/18 0408 09/08/18 0505 09/09/18 0500  NA 141 144 146* 149* 151* 153*  K 3.7 3.5  3.6 3.7 3.3* 3.0*  CL 111 111 112* 112* 112* 107  CO2 24 24 26 30  32 34*  GLUCOSE 134* 164* 298* 256* 163* 239*  BUN 27* 23 21 21 21 22   CREATININE 0.84 0.65 0.58 0.62 0.58 0.61  CALCIUM 7.4* 7.4* 7.7* 8.1* 8.4* 8.4*  MG 2.2  --  1.6* 1.8 1.9 1.9  PHOS  --   --  2.3* 3.4 3.5 4.2   GFR: Estimated Creatinine Clearance: 76.4 mL/min (by C-G formula based on SCr of 0.61 mg/dL). Liver Function Tests: Recent Labs  Lab  09/07/18 0408 09/08/18 0505 09/09/18 0500  AST 63* 50*  --   ALT 419* 207*  --   ALKPHOS 57 46  --   BILITOT 1.0 1.1  --   PROT 4.4* 4.9*  --   ALBUMIN 1.4* 2.6* 3.3*   No results for input(s): LIPASE, AMYLASE in the last 168 hours. No results for input(s): AMMONIA in the last 168 hours. Coagulation Profile: Recent Labs  Lab 09/05/18 1930  INR 1.22   Cardiac Enzymes: No results for input(s): CKTOTAL, CKMB, CKMBINDEX, TROPONINI in the last 168 hours. BNP (last 3 results) No results for input(s): PROBNP in the last 8760 hours. HbA1C: No results for input(s): HGBA1C in the last 72 hours. CBG: Recent Labs  Lab 09/08/18 2039 09/09/18 0009 09/09/18 0321 09/09/18 0715 09/09/18 1148  GLUCAP 241* 269* 218* 204* 220*   Lipid Profile: Recent Labs    09/07/18 0408 09/08/18 0548  TRIG 99 95   Thyroid Function Tests: No results for input(s): TSH, T4TOTAL, FREET4, T3FREE, THYROIDAB in the last 72 hours. Anemia Panel: No results for input(s): VITAMINB12, FOLATE, FERRITIN, TIBC, IRON, RETICCTPCT in the last 72 hours. Sepsis Labs: No results for input(s): PROCALCITON, LATICACIDVEN in the last 168 hours.  Recent Results (from the past 240 hour(s))  Urine culture     Status: Abnormal   Collection Time: 09/10/2018  8:00 AM  Result Value Ref Range Status   Specimen Description URINE, CLEAN CATCH  Final   Special Requests   Final    NONE Performed at [facility], [address] [place], [place] [address]    Culture (A)  Final    50,000 COLONIES/mL MULTIPLE SPECIES PRESENT, SUGGEST RECOLLECTION   Report Status 09/01/2018 FINAL  Final  Culture, blood (routine x 2)     Status: None   Collection Time: 09/01/2018 10:05 PM  Result Value Ref Range Status   Specimen Description BLOOD RIGHT HAND  Final   Special Requests   Final    BOTTLES DRAWN AEROBIC ONLY Blood Culture adequate volume   Culture   Final    NO GROWTH 5 DAYS Performed at [facility], [place] [address]  [place], [place] [address]    Report Status 09/05/2018 FINAL  Final  Culture, blood (routine x 2)     Status: None   Collection Time: 08/21/2018 10:05 PM  Result Value Ref Range Status   Specimen Description BLOOD LEFT HAND  Final   Special Requests   Final    BOTTLES DRAWN AEROBIC ONLY Blood Culture results may not be optimal due to an inadequate volume of blood received in culture bottles   Culture   Final    NO GROWTH 5 DAYS Performed at [facility], [place] [address] [place],  [address]    Report Status 09/05/2018 FINAL  Final      Radiology Studies: Ct Angio Chest Pe W Or Wo Contrast  Result Date: 09/07/2018 CLINICAL DATA:  Undergoing chemotherapy for squamous cell lung cancer. Chronic thrombocytopenia. Clinical concern for pulmonary embolism. EXAM: CT ANGIOGRAPHY CHEST WITH CONTRAST TECHNIQUE: Multidetector CT imaging of the chest was performed using the standard protocol during bolus administration of intravenous contrast. Multiplanar CT image reconstructions and MIPs were obtained to evaluate the vascular anatomy. CONTRAST:  171mL ISOVUE-370 IOPAMIDOL (ISOVUE-370) INJECTION 76% COMPARISON:  Portable chest dated 09/01/2018. Chest CTA dated 07/23/2018. FINDINGS: Cardiovascular: Satisfactory opacification of the pulmonary arteries to the segmental level. No evidence of pulmonary embolism. Normal heart size. No pericardial effusion. Mild atheromatous aortic calcifications. Mediastinum/Nodes: No enlarged mediastinal, right hilar, or axillary lymph nodes. Thyroid gland, trachea, and esophagus demonstrate no significant findings. Lungs/Pleura: Moderate-sized left pleural effusion, increased. Irregular left upper lobe mass with 2 confluent components, measuring a combined 3.6 x 3.0 cm on image number 40 series 6, previously 3.0 x 1.5 cm. This continues to extend to the superior left hilum. More superiorly in the left upper lobe, there is a 10 mm nodule on image number 29 series 6, previously  5 mm. In the anterior aspect of the left upper lobe, there is a 1.6 x 1.6 cm nodule on image number 27 series 6, previously 6 mm. There is also interval confluent soft tissue density encasing the posterior left hilum, including the left lower lobe bronchi and pulmonary arteries. This is causing moderate to marked bronchial narrowing. Interval small right upper lobe nodule measuring 6 mm on image number 38 series 6. Interval two adjacent right lower lobe nodules, 1 measuring 11 mm and the other measuring 6 mm on image number 66 series 6. Mild left lower lobe and left upper lobe atelectasis is noted. Upper Abdomen: Cholecystectomy clips. Normal appearing adrenal glands. No visible liver masses. Musculoskeletal: Unremarkable bones. Review of the MIP images confirms the above findings. IMPRESSION: 1. No pulmonary emboli. 2. Interval increase in size of the left upper lobe mass, as described above, compatible with the patient's known primary lung carcinoma. 3. Interval increase hilar invasion of the left lung mass with a significant new component encasing the left lower lobe bronchi and arteries with moderate to marked bronchial narrowing. 4. Progressive metastatic disease in the left upper lobe with interval metastatic disease in the right upper lobe and right lower lobe. 5. Moderate-sized left pleural effusion, increased. 6. Mild left lower lobe and left upper lobe atelectasis. Aortic Atherosclerosis (ICD10-I70.0). Electronically Signed   By: Claudie Revering M.D.   On: 09/07/2018 18:54   Dg Chest Port 1 View  Result Date: 09/08/2018 CLINICAL DATA:  Respiratory failure. EXAM: PORTABLE CHEST 1 VIEW COMPARISON:  CT chest 09/07/2018 and one-view chest x-ray 09/01/2018 FINDINGS: A right-sided PICC line is been placed. The tip terminates in the distal SVC. Left subclavian Port-A-Cath is stable. Left upper lobe mass is again noted. There is volume loss with elevation of left hemidiaphragm. Mild asymmetric left-sided edema is  superimposed. IMPRESSION: 1. Left upper lobe mass. 2. Mild left-sided edema. 3. Interval placement of right-sided PICC line. Electronically Signed   By: San Morelle M.D.   On: 09/08/2018 07:56        Scheduled Meds: . arformoterol  15 mcg Nebulization BID  . budesonide (PULMICORT) nebulizer solution  0.5 mg Nebulization BID  . chlorhexidine  15 mL Mouth Rinse BID  . furosemide  20 mg Intravenous BID  . insulin aspart  0-15 Units Subcutaneous Q4H  . ipratropium-albuterol  3 mL Nebulization BID  . mouth rinse  15 mL  Mouth Rinse q12n4p  . metoprolol tartrate  5 mg Intravenous Q6H  . omeprazole  40 mg Oral Daily  . potassium chloride  40 mEq Oral Q4H  . predniSONE  40 mg Oral BID WC  . sodium chloride flush  10-40 mL Intracatheter Q12H   Continuous Infusions: . sodium chloride Stopped (09/08/18 1230)  . lactated ringers Stopped (09/09/18 1016)  . methocarbamol (ROBAXIN) IV    . TPN ADULT (ION) 50 mL/hr at 09/09/18 1100  . TPN ADULT (ION)      Assessment & Plan: Perforated diverticulitis with sepsis present on admission:  - Status post surgery/colostomy/wound VAC in place.   -General surgery is managing postop care.   -Patient is on TPN.   -Acute encephalopathy has resolved.   -Electrolyte abnormality and pancytopenia persist.   -Continue to monitor closely, replete electrolytes, manage abnormal electrolytes and transfuse packed red blood cells and platelets as needed.   -Guarded prognosis.  Altered mental status:  Likely combined toxic and metabolic.   Resolved significantly.   Continue to monitor.  Pancytopenia:  -Hematology/oncology team is directing care.   -Likely multifactorial.   -Patient has been exposed to chemotherapy and other than medications.   -Work-up is said to be negative for DIC.   -Continue to monitor and manage expectantly.  -09/09/2018: CBC done earlier today revealed WBC of 1.2, hemoglobin of 6 and platelet count of 16.  Acute hypoxic  respiratory failure:  - Currently on 1 L O2 via nasal cannula. -Likely multifactorial.  Patient is morbidly obese.  Prior documentations indicate that patient may have underlying COPD and stage III squamous cell lung CA.  -Patient is currently on IV Solu-Cortef.  Will DC IV Solu-Cortef and restart home dose prednisone  -Last ABG done is negative.  Diastolic CHF/elevated troponin:  -Likely type II elevation of troponin.  -No chest pain.  -No obvious clinical symptoms suggestive of exacerbation of CHF.  -Continue to assess and manage.    AKI:  Creatinine peaked to 1.5 during hospital course and now normalized.  Normalized with IV fluids 09/07/2018: AKI has resolved.  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 On stress dose IV steroids while here. Will start tapering and monitor blood pressure.   09/08/2018: CT angio chest revealed possible worsening disease.  Oncology is following patient already.  Attempt was made to discuss with oncology but not successful.  We will continue to try.  GERD:   IV Protonix changed to oral omeprazole  COPD:  Continue neb treatments/inhaler therapy. Resume home dose prednisone today (DC IV Solu-Cortef)  Hypernatremia:  Likely secondary to combined effect of IV Solu-Cortef and ineffective circulatory arterial volume.   -Solu-Cortef will be discontinued today.   -Patient is currently on IV albumin.   -Continue to monitor sodium and potassium levels.    DVT prophylaxis: SCDs given  thrombocytopenia Code Status: Full code Family / Patient Communication: Husband  Disposition: This will depend on hospital course.     LOS: 9 days     Bonnell Public, MD [facility] Pager [phone]  If 7PM-7AM, please contact night-coverage [email or web address] Password [facility] 09/09/2018, 2:33 PM

## 2018-09-09 NOTE — Progress Notes (Signed)
Occupational Therapy Treatment Patient Details Name: [name] MRN: [id number] DOB: 21-Dec-1945 Today's Date: 09/09/2018    History of present illness [place] Bryant is a 73 y.o. 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  Evaluation in the emergency department including a CT of the abdomen pelvis showed a bowel perforation at the level of the descending colon and sigmoid colon with air tracking up to the left pelvis with surrounding mesenteric stranding. Pt is now s/p  exploratory laparotomy, partial colectomy with end colostomy on 09/05/2018. Hospital course complicated by acute on chronic thrombocytopenia.    OT comments  Pt progressing slowly towards OT goals. Pt very agreeable to get to recliner for time OOB. Pt requiring Max a =2 for rolling to place in Maxi lift pad; use of Maxi sky to transfer to recliner. Pt then participating in self feeding task and required Min A and hand over hand for grasp strength and coordination of RUE. Pt benefiting from built up handle on spoon. Pt continued to present with decreased cognition requiring increased time and cues throughout session. Continue to recommend dc to SNF and will continue to follow acutely as admitted.    Follow Up Recommendations  SNF;Supervision/Assistance - 24 hour    Equipment Recommendations  Other (comment)(TBD in next venue)    Recommendations for Other Services      Precautions / Restrictions Precautions Precautions: Fall Precaution Comments: jp drain, wound vac, colostomy  Restrictions Weight Bearing Restrictions: No       Mobility Bed Mobility Overal bed mobility: Needs Assistance Bed Mobility: Rolling Rolling: Max assist;+2 for physical assistance;+2 for safety/equipment         General bed mobility comments: Max A +2 for rolling left and  right. Max cues   Transfers Overall transfer level: Needs assistance               General transfer comment: Total A to lift with Maxi Sky to recliner    Balance                                           ADL either performed or assessed with clinical judgement   ADL Overall ADL's : Needs assistance/impaired Eating/Feeding: Minimal assistance;Sitting Eating/Feeding Details (indicate cue type and reason): While sitting in the recliner, pt participated in self feeding. Pt with decreased grasp strength and coorindation. Pt benefiting from built up handle on spoon to eat jello. Pt requiring Min hand over hand to guide spoon to mouth and scoop jello.                                  Functional mobility during ADLs: Total assistance(Using Maxi Sky to lift to recliner) General ADL Comments: Pt performing bed mobility rolling to place in sky lift and then transfer to recliner. Pt particiapting in self feeding once upright in recliner     Vision       Perception     Praxis      Cognition Arousal/Alertness: Lethargic;Awake/alert Behavior During Therapy: Flat affect Overall Cognitive Status: Impaired/Different from baseline Area of Impairment: Following commands;Awareness;Attention  Current Attention Level: Sustained   Following Commands: Follows one step commands inconsistently;Follows one step commands with increased time   Awareness: Intellectual   General Comments: Pt stating "yes ma'am" often and answer some yes/no questions. Requiring increased cues and time        Exercises Exercises: General Upper Extremity;Other exercises General Exercises - Upper Extremity Shoulder Flexion: AAROM;Left;5 reps;Supine Other Exercises Other Exercises: Elevating BUEs for edema management   Shoulder Instructions       General Comments RN present. VSS    Pertinent Vitals/ Pain       Pain Assessment: Faces Faces Pain  Scale: Hurts whole lot Pain Location: abdomen Pain Descriptors / Indicators: Grimacing;Guarding;Moaning Pain Intervention(s): Limited activity within patient's tolerance;Monitored during session;Repositioned  Home Living                                          Prior Functioning/Environment              Frequency  Min 2X/week        Progress Toward Goals  OT Goals(current goals can now be found in the care plan section)  Progress towards OT goals: Progressing toward goals  Acute Rehab OT Goals Patient Stated Goal: less pain OT Goal Formulation: With patient Time For Goal Achievement: 09/19/18 Potential to Achieve Goals: Good ADL Goals Pt Will Perform Eating: with set-up;sitting Pt Will Perform Grooming: with set-up;sitting Pt Will Perform Upper Body Bathing: with min guard assist;sitting Pt Will Perform Upper Body Dressing: with min guard assist;sitting Additional ADL Goal #1: Pt will complete bed mobility with modA as precursor to EOB/OOB ADL Additional ADL Goal #2: Pt will tolerate upright position (sitting EOB/bed in chair position/up in recliner) at least 10 min.  Plan Discharge plan remains appropriate    Co-evaluation                 AM-PAC OT "6 Clicks" Daily Activity     Outcome Measure   Help from another person eating meals?: A Lot Help from another person taking care of personal grooming?: A Lot Help from another person toileting, which includes using toliet, bedpan, or urinal?: Total Help from another person bathing (including washing, rinsing, drying)?: Total Help from another person to put on and taking off regular upper body clothing?: Total Help from another person to put on and taking off regular lower body clothing?: Total 6 Click Score: 8    End of Session Equipment Utilized During Treatment: Oxygen(3L)  OT Visit Diagnosis: Muscle weakness (generalized) (M62.81);Pain Pain - part of body: (abdomen)   Activity  Tolerance Patient limited by fatigue;Patient limited by pain   Patient Left with call bell/phone within reach;in chair;with nursing/sitter in room   Nurse Communication Mobility status        Time: 3976-7341 OT Time Calculation (min): 37 min  Charges: OT General Charges $OT Visit: 1 Visit OT Treatments $Self Care/Home Management : 23-37 mins  [place], OTR/L Acute Rehab Pager: [phone] Office: [place] 09/09/2018, 4:51 PM

## 2018-09-09 NOTE — Progress Notes (Signed)
CRITICAL VALUE ALERT  Critical Value:  WBC 1.2, Hgb 6, Platelet 16  Date & Time Notied:  1/27, 0750  Provider Notified: Dana Allan  Orders Received/Actions taken: Awaiting call back.

## 2018-09-09 NOTE — Progress Notes (Signed)
Hematology: Reviewed chart. CBC Latest Ref Rng & Units 09/09/2018 09/08/2018 09/07/2018  WBC 4.0 - 10.5 K/uL 1.2(LL) 2.5(L) 4.7  Hemoglobin 12.0 - 15.0 g/dL 6.0(LL) 7.1(L) 8.2(L)  Hematocrit 36.0 - 46.0 % 20.8(L) 23.8(L) 26.9(L)  Platelets 150 - 400 K/uL 16(LL) 20(LL) 26(LL)   Worsening Pancytopenia: ANC: 900 Pl: 16 Hb: 6 - Agree with PRBC and platelets - Afebrile - If ANC continues to decline, will need growth factor support Dr. Delton Coombes discussed with family the overall plan. I will discuss with him.

## 2018-09-09 NOTE — Progress Notes (Signed)
PHARMACY - ADULT TOTAL PARENTERAL NUTRITION CONSULT NOTE   Pharmacy Consult:  TPN Indication:  Prolonged ileus  Patient Measurements: Height: _0  (160 cm) Weight: 234 lb 12.6 oz (106.5 kg) IBW/kg (Calculated) : 52.4 TPN AdjBW (KG): 65.9 Body mass index is 41.59 kg/m.  Assessment:  13 YOF presented on 08/27/2018 with abdominal pain and loose stools for 2 weeks that improved minimally with loperamide.  Found to have perforated diverticulitis and underwent ex-lap with partial colectomy, end colostomy and LoA on 08/29/2018.  Patient was started on a clear liquid diet since 09/04/18 and intake has been inadequate per charting.  Pharmacy consulted to manage TPN for post-op ileus.  Patient couldn't stay awake for an interview.  GI: hx GERD on PO Prilosec. Prealbumin remains low at 5.7. Albumin 25g IV Q6h per MD. Body fluid draining around JP drain, O/P 11m. LBM 1/23 Endo: DM on metformin and Lantus 50/d PTA, A1c 8.5%.  CBGs uncontrolled 204-239 (also on HC 531mBID - transitioning to prednisone 4074mID 1/28) Insulin requirements in the past 24 hours: 32 units + 40 units in TPN Lytes: high Na, CO2 high at 34, K down to 3 (S/p K runs x 4 (6 were ordered, ?not all given) + s/p Lasix 23m34m x 4 doses; goal >/= 4 for ileus), Mag 1.9 (s/p Mag sulfate 2g IV x 1; goal >/= 2 for ileus), corrCa slightly low at ~8.6 Renal: SCr 0.61 stable, BUN WNL - UOP 1.4 ml/kg/hr, LR at 10 ml/hr, net +3.9L Pulm: COPD on 4L [place] - Brovana, Pulmicort, Duonebs Cards: HTN/HLD - CTA neg for PE. BP/HR elevated (in Afib) - Lopressor. S/p Lasix Hepatobil: AST/ALT down to 50/207, alk phos / tbili / TG WNL Onc: SCC of left lung s/p immunotherapy - pancytopenia Neuro: Bipolar - PRN morphine/dilaudid/apap ID: s/p Merrem - tmax/24h 100.6, WBC low 1.2 TPN Access: left chest port per Dr. CornBrantley StageICC placed 09/06/18 TPN start date: 09/06/18  Nutritional Goals (RD rec on 1/25): 1650-1850 kCal and 115-130gm protein per day  Current  Nutrition:  TPN Clears (as long as patient alert enough per Surgery)  Plan:  Continue TPN at 50 ml/hr (goal rate 80 ml/hr) TPN provides 75g AA, 132g CHO and 32g ILE for a total of 1067 kCal, meeting ~65% of patient's needs Electrolytes in TPN: no Na, increase Ca slightly; change Cl:Ac to 1:1 (will not increase K today in TPN as Lasix has been d/c'd) Daily multivitamin and trace elements in TPN Continue moderate SSI Q4H + increase regular insulin in TPN to 50 units (IV steroids >> prednisone today) KCL x 6 runs Mag sulfate 2gm IV x 1 F/U AM labs, CBG control to further advance TPN  HaleElicia LamparmD, BCPS Please check AMION for all [facility] numbers Clinical Pharmacist 09/09/2018 8:02 AM

## 2018-09-09 NOTE — Progress Notes (Addendum)
Daily Progress Note   Patient Name: [name]       Date: 09/09/2018 DOB: 27-Dec-1945  Age: 73 y.o. MRN#: [id number] Attending Physician: Bonnell Public, MD Primary Care Physician: Fayrene Helper, MD Admit Date: 08/30/2018  Reason for Consultation/Follow-up: Establishing goals of care  Subjective: Patient is resting in bed. Second son is not present for family meeting.  She jokes a bit with her family by giving them looks, and is kind but minimally talkative. Son spoke with Dr. Delton Coombes yesterday. Son states per their conversation she will return for treatment which would be with a curative focus. He and the family are eager to help [name] any way they can get though this hospitalization and become stronger so that she can get back to cancer treatment. He understands her health is tenuous but family wants to continue to try what they can as long as she is not suffering.     Discussed GOC. Overall, she would like to continue all care possible except dialysis as she is eager to return to oncology. She would never want dialysis as this would would be a process that would be for the foreseeable future thus affecting her quality of life long term. She would be amenable to a feeding tube.  She states she wishes to have resuscitative efforts. Her son adds "if it works it works", to which she nodded.       Encouraged IS use. She is eager to work with therapy. She would like to move to a room with a working [employer] lift when one comes available.   Length of Stay: 9  Current Medications: Scheduled Meds:  . sodium chloride   Intravenous Once  . arformoterol  15 mcg Nebulization BID  . budesonide (PULMICORT) nebulizer solution  0.5 mg Nebulization BID  . chlorhexidine  15 mL Mouth Rinse  BID  . insulin aspart  0-15 Units Subcutaneous Q4H  . ipratropium-albuterol  3 mL Nebulization BID  . mouth rinse  15 mL Mouth Rinse q12n4p  . metoprolol tartrate  5 mg Intravenous Q6H  . omeprazole  40 mg Oral Daily  . potassium chloride  40 mEq Oral Q4H  . predniSONE  40 mg Oral BID WC  . sodium chloride flush  10-40 mL Intracatheter Q12H    Continuous Infusions: .  sodium chloride Stopped (09/08/18 1230)  . albumin human Stopped (09/09/18 0409)  . lactated ringers 10 mL/hr at 09/09/18 0800  . magnesium sulfate 1 - 4 g bolus IVPB    . methocarbamol (ROBAXIN) IV    . potassium chloride    . TPN ADULT (ION) 50 mL/hr at 09/09/18 0800    PRN Meds: sodium chloride, acetaminophen **OR** acetaminophen, albuterol, HYDROmorphone (DILAUDID) injection, methocarbamol (ROBAXIN) IV, morphine injection, ondansetron **OR** ondansetron (ZOFRAN) IV, sodium chloride flush  Physical Exam Pulmonary:     Effort: Pulmonary effort is normal.  Neurological:     Mental Status: She is alert.             Vital Signs: BP (!) 146/67 (BP Location: Left Arm)   Pulse (!) 112   Temp 97.9 F (36.6 C) (Oral)   Resp 19   Ht 5\' 3"  (1.6 m)   Wt 106.5 kg   SpO2 99%   BMI 41.59 kg/m  SpO2: SpO2: 99 % O2 Device: O2 Device: Nasal Cannula O2 Flow Rate: O2 Flow Rate (L/min): 3 L/min  Intake/output summary:   Intake/Output Summary (Last 24 hours) at 09/09/2018 0254 Last data filed at 09/09/2018 0800 Gross per 24 hour  Intake 1026.14 ml  Output 3740 ml  Net -2713.86 ml   LBM: Last BM Date: 09/04/18 Baseline Weight: Weight: 99.2 kg Most recent weight: Weight: 106.5 kg       Palliative Assessment/Data: 30%    Flowsheet Rows     Most Recent Value  Intake Tab  Referral Department  Hospitalist  Unit at Time of Referral  Intermediate Care Unit  Palliative Care Primary Diagnosis  Cancer  Date Notified  09/07/18  Palliative Care Type  New Palliative care  Reason for referral  Clarify Goals of Care    Date of Admission  09/08/2018  # of days IP prior to Palliative referral  7  Clinical Assessment  Psychosocial & Spiritual Assessment  Palliative Care Outcomes      Patient Active Problem List   Diagnosis Date Noted  . Diverticulitis of colon with perforation 08/22/2018  . Uncontrolled type 2 diabetes mellitus with hyperglycemia ([place]) 08/17/2018  . Pressure injury of skin 09/02/2018  . S/P exploratory laparotomy   . Hypertensive urgency   . Diabetes mellitus, insulin dependent (IDDM), uncontrolled ([place]) 08/28/2018  . Recurrent falls while walking 08/28/2018  . Hyperglycemia 08/08/2018  . Leukocytosis 08/08/2018  . Thrombocytopenia ([place]) 08/08/2018  . SOB (shortness of breath) 07/22/2018  . Chest pain 07/22/2018  . Chronic pain 07/22/2018  . Chronic constipation 07/22/2018  . Physical deconditioning 07/22/2018  . Family history of stomach cancer   . Family history of lung cancer   . Malignant neoplasm of upper lobe of left lung ([place]) 01/22/2018  . Hospital discharge follow-up 11/09/2017  . Squamous cell lung cancer, left ([place]) 11/01/2017  . COPD (chronic obstructive pulmonary disease) ([place]) 10/29/2017  . Emphysema lung ([place]) 02/26/2017  . Mass of upper lobe of left lung 02/26/2017  . Encounter for chronic pain management 08/26/2016  . Anemia, deficiency 07/27/2016  . H/O nicotine dependence 12/05/2014  . Anxiety and depression 12/31/2013  . Female bladder prolapse 03/10/2013  . Impaired fasting glucose 10/03/2010  . Vitamin D deficiency 09/08/2009  . Back pain with sciatica 04/04/2009  . HTN (hypertension) 06/09/2008  . ABDOMINAL WALL HERNIA 06/09/2008  . HLD (hyperlipidemia) 11/14/2007  . Morbid obesity ([place]) 11/14/2007  . Anxiety 11/14/2007  . GERD (gastroesophageal reflux disease) 11/14/2007  Palliative Care Assessment & Plan   Recommendations/Plan:  Recommend palliative at D/C.   Code Status:    Code Status Orders  (From admission, onward)          Start     Ordered   08/18/2018 1332  Full code  Continuous     09/06/2018 1331        Code Status History    Date Active Date Inactive Code Status Order ID Comments User Context   08/08/2018 1645 08/11/2018 1551 Full Code [id number]  Shela Leff, MD Inpatient   08/08/2018 1554 08/08/2018 1645 DNR [id number]  Shela Leff, MD ED   07/22/2018 1722 07/25/2018 1328 DNR [id number]  Shela Leff, MD Inpatient   10/28/2017 2100 11/01/2017 1911 Full Code [id number]  Debbe Odea, MD ED    Advance Directive Documentation     Most Recent Value  Type of Advance Directive  Healthcare Power of Attorney  Pre-existing out of facility DNR order (yellow form or pink MOST form)  -  "MOST" Form in Place?  -       Prognosis:  Unable to determine.  Discharge Planning:  To Be Determined  Care plan was discussed with RN, charge RN  Thank you for allowing the Palliative Medicine Team to assist in the care of this patient.   Time In: 8:30 Time Out: 9:50 Total Time 1 hour 20 min Prolonged Time Billed yes      Greater than 50%  of this time was spent counseling and coordinating care related to the above assessment and plan.  Asencion Gowda, NP  Please contact Palliative Medicine Team phone at [phone] for questions and concerns.

## 2018-09-09 NOTE — Care Management Note (Signed)
Case Management Note  Patient Details  Name: [name] MRN: [id number] Date of Birth: 01-Apr-1946  Subjective/Objective:  Pt admitted on 08/21/2018 with abdominal pain and loose stools; CT revealed bowel perforation. Pt is now s/p  exploratory laparotomy, partial colectomy with end colostomy on 08/17/2018.  PTA, pt needs assistance with ADLS, lives with spouse.                  Action/Plan: PT/OT recommending SNF for rehab at discharge.  Palliative medicine team has met with pt and family for assistance with Goals of Care; they are recommending palliative follow up at discharge. CSW to follow up for possible SNF placement.    Expected Discharge Date:                  Expected Discharge Plan:  Skilled Nursing Facility  In-House Referral:  Clinical Social Work, Hospice / Palliative Care  Discharge planning Services  CM Consult  Post Acute Care Choice:    Choice offered to:     DME Arranged:    DME Agency:     HH Arranged:    HH Agency:     Status of Service:  In process, will continue to follow  If discussed at Long Length of Stay Meetings, dates discussed:    Additional Comments:  Reinaldo Raddle, RN, BSN  Trauma/Neuro ICU Case Manager [phone]

## 2018-09-10 ENCOUNTER — Ambulatory Visit: Payer: Self-pay | Admitting: Family Medicine

## 2018-09-10 LAB — TYPE AND SCREEN
ABO/RH(D): O POS
Antibody Screen: NEGATIVE
Unit division: 0
Unit division: 0

## 2018-09-10 LAB — GLUCOSE, CAPILLARY
Glucose-Capillary: 229 mg/dL — ABNORMAL HIGH (ref 70–99)
Glucose-Capillary: 183 mg/dL — ABNORMAL HIGH (ref 70–99)
Glucose-Capillary: 191 mg/dL — ABNORMAL HIGH (ref 70–99)
Glucose-Capillary: 228 mg/dL — ABNORMAL HIGH (ref 70–99)
Glucose-Capillary: 156 mg/dL — ABNORMAL HIGH (ref 70–99)

## 2018-09-10 LAB — PHOSPHORUS: Phosphorus: 3 mg/dL (ref 2.5–4.6)

## 2018-09-10 LAB — CBC WITH DIFFERENTIAL/PLATELET
Abs Immature Granulocytes: 0 10*3/uL (ref 0.00–0.07)
Basophils Absolute: 0 10*3/uL (ref 0.0–0.1)
Basophils Relative: 1 %
Eosinophils Absolute: 0 10*3/uL (ref 0.0–0.5)
Eosinophils Relative: 0 %
HCT: 35.9 % — ABNORMAL LOW (ref 36.0–46.0)
HEMOGLOBIN: 11.4 g/dL — AB (ref 12.0–15.0)
Lymphocytes Relative: 20 %
Lymphs Abs: 0.3 10*3/uL — ABNORMAL LOW (ref 0.7–4.0)
MCH: 28.9 pg (ref 26.0–34.0)
MCHC: 31.8 g/dL (ref 30.0–36.0)
MCV: 90.9 fL (ref 80.0–100.0)
Monocytes Absolute: 0.2 10*3/uL (ref 0.1–1.0)
Monocytes Relative: 13 %
Myelocytes: 2 %
Neutro Abs: 1.1 10*3/uL — ABNORMAL LOW (ref 1.7–7.7)
Neutrophils Relative %: 64 %
Platelets: 33 10*3/uL — ABNORMAL LOW (ref 150–400)
RBC: 3.95 MIL/uL (ref 3.87–5.11)
RDW: 17.1 % — ABNORMAL HIGH (ref 11.5–15.5)
WBC: 1.7 10*3/uL — AB (ref 4.0–10.5)
nRBC: 28 % — ABNORMAL HIGH (ref 0.0–0.2)
nRBC: 45 /100 WBC — ABNORMAL HIGH

## 2018-09-10 LAB — MRSA PCR SCREENING: MRSA BY PCR: NEGATIVE

## 2018-09-10 LAB — COMPREHENSIVE METABOLIC PANEL
ALT: 87 U/L — ABNORMAL HIGH (ref 0–44)
AST: 55 U/L — ABNORMAL HIGH (ref 15–41)
Albumin: 2.8 g/dL — ABNORMAL LOW (ref 3.5–5.0)
Alkaline Phosphatase: 58 U/L (ref 38–126)
Anion gap: 14 (ref 5–15)
BUN: 26 mg/dL — ABNORMAL HIGH (ref 8–23)
CO2: 29 mmol/L (ref 22–32)
Calcium: 8.8 mg/dL — ABNORMAL LOW (ref 8.9–10.3)
Chloride: 109 mmol/L (ref 98–111)
Creatinine, Ser: 0.72 mg/dL (ref 0.44–1.00)
GFR calc Af Amer: >60 mL/min (ref >60)
GFR calc non Af Amer: >60 mL/min (ref >60)
Glucose, Bld: 240 mg/dL — ABNORMAL HIGH (ref 70–99)
Potassium: 3.7 mmol/L (ref 3.5–5.1)
Sodium: 152 mmol/L — ABNORMAL HIGH (ref 135–145)
Total Bilirubin: 2.1 mg/dL — ABNORMAL HIGH (ref 0.3–1.2)
Total Protein: 5.5 g/dL — ABNORMAL LOW (ref 6.5–8.1)

## 2018-09-10 LAB — BPAM RBC
Blood Product Expiration Date: 202002282359
Blood Product Expiration Date: 202002282359
ISSUE DATE / TIME: 202001281512
ISSUE DATE / TIME: 202001281709
Unit Type and Rh: 5100
Unit Type and Rh: 5100

## 2018-09-10 LAB — BPAM PLATELET PHERESIS
Blood Product Expiration Date: 202001292359
ISSUE DATE / TIME: 202001280959
Unit Type and Rh: 5100

## 2018-09-10 LAB — PREPARE PLATELET PHERESIS: Unit division: 0

## 2018-09-10 LAB — MAGNESIUM: Magnesium: 1.7 mg/dL (ref 1.7–2.4)

## 2018-09-10 MED ORDER — DEXTROSE 5 % IV SOLN: INTRAVENOUS | Status: DC

## 2018-09-10 MED ORDER — SODIUM CHLORIDE 0.45 % IV SOLN
INTRAVENOUS | Status: DC
  Administered 2018-09-10: 15:00:00 via INTRAVENOUS

## 2018-09-10 MED ORDER — TRAVASOL 10 % IV SOLN
INTRAVENOUS | Status: DC
  Administered 2018-09-10: 18:00:00 via INTRAVENOUS
  Filled 2018-09-10: qty 375

## 2018-09-10 MED ORDER — POTASSIUM CHLORIDE CRYS ER 20 MEQ PO TBCR
20.0000 meq | EXTENDED_RELEASE_TABLET | Freq: Two times a day (BID) | ORAL | Status: DC
  Administered 2018-09-10 (×2): 20 meq via ORAL
  Filled 2018-09-10 (×2): qty 1

## 2018-09-10 MED ORDER — ENSURE ENLIVE PO LIQD
237.0000 mL | Freq: Three times a day (TID) | ORAL | Status: DC
  Administered 2018-09-10: 237 mL via ORAL

## 2018-09-10 MED ORDER — OXYCODONE HCL 5 MG PO TABS
5.0000 mg | ORAL_TABLET | ORAL | Status: DC | PRN
  Administered 2018-09-10: 10 mg via ORAL
  Administered 2018-09-10: 5 mg via ORAL
  Filled 2018-09-10: qty 2
  Filled 2018-09-10: qty 1

## 2018-09-10 MED ORDER — HYDROMORPHONE HCL 1 MG/ML IJ SOLN
1.0000 mg | INTRAMUSCULAR | Status: DC | PRN
  Administered 2018-09-10 – 2018-09-11 (×4): 1 mg via INTRAVENOUS
  Filled 2018-09-10 (×4): qty 1

## 2018-09-10 MED ORDER — SODIUM CHLORIDE 0.9 % IV BOLUS
500.0000 mL | Freq: Once | INTRAVENOUS | Status: AC
  Administered 2018-09-10: 500 mL via INTRAVENOUS

## 2018-09-10 MED ORDER — METOPROLOL TARTRATE 5 MG/5ML IV SOLN
5.0000 mg | INTRAVENOUS | Status: AC | PRN
  Administered 2018-09-10 – 2018-09-11 (×3): 5 mg via INTRAVENOUS

## 2018-09-10 MED ORDER — METHOCARBAMOL 500 MG PO TABS
500.0000 mg | ORAL_TABLET | Freq: Three times a day (TID) | ORAL | Status: DC
  Administered 2018-09-10 (×3): 500 mg via ORAL
  Filled 2018-09-10 (×3): qty 1

## 2018-09-10 MED ORDER — PREDNISONE 20 MG PO TABS: 40.0000 mg | ORAL_TABLET | Freq: Every day | ORAL | Status: DC

## 2018-09-10 NOTE — Progress Notes (Addendum)
Daily Progress Note   Patient Name: [name]       Date: 09/10/2018 DOB: [birth year]  Age: 73 y.o. MRN#: [id number] Attending Physician: Shelly Coss, MD Primary Care Physician: Fayrene Helper, MD Admit Date: 08/29/2018  Reason for Consultation/Follow-up: Establishing goals of care  Subjective: Patient is resting in bed. She is confused. She answers "yes" to questions. 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   Called husband Herbie [place] on home phone with no answering machine engaging to leave a message. No answer at work number listed.  Spoke with her son Coralyn Mark via phone. He states he was surprised yesterday that his mother said she would want a ventilator, he did not think she would. We discussed that these [place] conversations need to be ongoing and time limits discussed for different interventions such as a ventilator if she continues to desire this. We dicussed her overall health, current status, and wound healing in relation to nutrition. He states he realizes she may not progress the way he hoped and that in the best case scenario, she has a long difficult road with the ostomy, deconditioning, and cancer, and the family does not want her to suffer, but he nor his family want to have any doubts and wants to be sure they gave her the best chance.   Per our conversation, he would like to see how things go through the weekend with follow up from palliative early next week. He would like to speak with palliative sooner if she declines. We will shadow for decline.    Length of Stay: 10  Current Medications: Scheduled Meds:  . arformoterol  15 mcg  Nebulization BID  . budesonide (PULMICORT) nebulizer solution  0.5 mg Nebulization BID  . chlorhexidine  15 mL Mouth Rinse BID  . furosemide  20 mg Intravenous BID  . insulin aspart  0-15 Units Subcutaneous Q4H  . ipratropium-albuterol  3 mL Nebulization BID  . mouth rinse  15 mL Mouth Rinse q12n4p  . methocarbamol  500 mg Oral TID  . metoprolol tartrate  5 mg Intravenous Q6H  . omeprazole  40 mg Oral Daily  . potassium chloride  20 mEq  Oral BID  . predniSONE  40 mg Oral BID WC  . sodium chloride flush  10-40 mL Intracatheter Q12H    Continuous Infusions: . sodium chloride Stopped (09/08/18 1230)  . lactated ringers 10 mL/hr at 09/09/18 1500  . TPN ADULT (ION) 50 mL/hr at 09/09/18 1747  . TPN ADULT (ION)      PRN Meds: sodium chloride, acetaminophen **OR** acetaminophen, albuterol, HYDROmorphone (DILAUDID) injection, ondansetron **OR** ondansetron (ZOFRAN) IV, oxyCODONE, sodium chloride flush  Physical Exam Pulmonary:     Effort: Pulmonary effort is normal.  Neurological:     Mental Status: She is alert.             Vital Signs: BP (!) 167/74   Pulse (!) 129   Temp 98.3 F (36.8 C) (Oral)   Resp (!) 26   Ht 5\' 3"  (1.6 m)   Wt 106.5 kg   SpO2 97%   BMI 41.59 kg/m  SpO2: SpO2: 97 % O2 Device: O2 Device: Nasal Cannula O2 Flow Rate: O2 Flow Rate (L/min): 3 L/min  Intake/output summary:   Intake/Output Summary (Last 24 hours) at 09/10/2018 1103 Last data filed at 09/10/2018 0930 Gross per 24 hour  Intake 1820.02 ml  Output 3510 ml  Net -1689.98 ml   LBM: Last BM Date: 09/10/18 Baseline Weight: Weight: 99.2 kg Most recent weight: Weight: 106.5 kg       Palliative Assessment/Data: 30%    Flowsheet Rows     Most Recent Value  Intake Tab  Referral Department  Hospitalist  Unit at Time of Referral  Intermediate Care Unit  Palliative Care Primary Diagnosis  Cancer  Date Notified  09/07/18  Palliative Care Type  New Palliative care  Reason for referral   Clarify Goals of Care  Date of Admission  08/28/2018  # of days IP prior to Palliative referral  7  Clinical Assessment  Psychosocial & Spiritual Assessment  Palliative Care Outcomes      Patient Active Problem List   Diagnosis Date Noted  . Diverticulitis of colon with perforation 08/13/2018  . Uncontrolled type 2 diabetes mellitus with hyperglycemia ([place]) 08/28/2018  . Pressure injury of skin 08/19/2018  . S/P exploratory laparotomy   . Hypertensive urgency   . Diabetes mellitus, insulin dependent (IDDM), uncontrolled ([place]) 08/28/2018  . Recurrent falls while walking 08/28/2018  . Hyperglycemia 08/08/2018  . Leukocytosis 08/08/2018  . Thrombocytopenia ([place]) 08/08/2018  . SOB (shortness of breath) 07/22/2018  . Chest pain 07/22/2018  . Chronic pain 07/22/2018  . Chronic constipation 07/22/2018  . Physical deconditioning 07/22/2018  . Family history of stomach cancer   . Family history of lung cancer   . Malignant neoplasm of upper lobe of left lung ([place]) 01/22/2018  . Hospital discharge follow-up 11/09/2017  . Squamous cell lung cancer, left ([place]) 11/01/2017  . COPD (chronic obstructive pulmonary disease) ([place]) 10/29/2017  . Emphysema lung ([place]) 02/26/2017  . Mass of upper lobe of left lung 02/26/2017  . Encounter for chronic pain management 08/26/2016  . Anemia, deficiency 07/27/2016  . H/O nicotine dependence 12/05/2014  . Anxiety and depression 12/31/2013  . Female bladder prolapse 03/10/2013  . Impaired fasting glucose 10/03/2010  . Vitamin D deficiency 09/08/2009  . Back pain with sciatica 04/04/2009  . HTN (hypertension) 06/09/2008  . ABDOMINAL WALL HERNIA 06/09/2008  . HLD (hyperlipidemia) 11/14/2007  . Morbid obesity ([place]) 11/14/2007  . Anxiety 11/14/2007  . GERD (gastroesophageal reflux disease) 11/14/2007    Palliative Care Assessment & Plan  Recommendations/Plan:  Palliative to shadow for decline through the remainder of this week  with follow up early  next week.   Code Status:    Code Status Orders  (From admission, onward)         Start     Ordered   08/25/2018 1332  Full code  Continuous     08/16/2018 1331        Code Status History    Date Active Date Inactive Code Status Order ID Comments User Context   08/08/2018 1645 08/11/2018 1551 Full Code [id number]  Shela Leff, MD Inpatient   08/08/2018 1554 08/08/2018 1645 DNR [id number]  Shela Leff, MD ED   07/22/2018 1722 07/25/2018 1328 DNR [id number]  Shela Leff, MD Inpatient   10/28/2017 2100 11/01/2017 1911 Full Code [id number]  Debbe Odea, MD ED    Advance Directive Documentation     Most Recent Value  Type of Advance Directive  Healthcare Power of Attorney  Pre-existing out of facility DNR order (yellow form or pink MOST form)  -  "MOST" Form in Place?  -       Prognosis:  Unable to determine.  Discharge Planning:  To Be Determined  Care plan was discussed with RN, charge RN  Thank you for allowing the Palliative Medicine Team to assist in the care of this patient.   Time In: 10:30 Time Out: 11:20 Total Time 50 min Prolonged Time Billed no      Greater than 50%  of this time was spent counseling and coordinating care related to the above assessment and plan.  Asencion Gowda, NP  Please contact Palliative Medicine Team phone at [phone] for questions and concerns.

## 2018-09-10 NOTE — Progress Notes (Signed)
Wound examined with WOC RN. Tissue looking more necrotic and slightly deeper this AM.    Stop VAC. Transition to TID wet to dry dressing.  Brigid Re , [facility] 09/10/2018, 9:56 AM Pager: [phone] Consults: [phone] Mon-Fri 7:00 am-4:30 pm Sat-Sun 7:00 am-11:30 am

## 2018-09-10 NOTE — Social Work (Signed)
CSW continuing to follow for support with disposition when medically appropriate, as well as for PMT decisions with family. Pt still on TPN, aware she is weaning.   Westley Hummer, MSW, [place] Work [phone]

## 2018-09-10 NOTE — Progress Notes (Addendum)
Temp 100.7 oral, HR 150s-160s sustaining, o2sat 89-90% on 4L nasal cannula, resp 36, [facility] paged to make aware. Next IV metoprolol scheduled for 0000. Will continue to observe.  New orders noted.   02 sats sustaining 86-89% on 4-5L nasal cannula. Increased 02 to 8L. Saturations remained at 87-88%. Respiratory called for assistance. HR remains in 140s. Can not carry out metoprolol order due to patient's b/p dropping to 80s/50s. Patient has congested breath sounds. Rapid response called for assistance. Temp 100.6 axillary.

## 2018-09-10 NOTE — Progress Notes (Signed)
[facility] Progress Note  10 Days Post-Op  Subjective: CC: hurts all over Patient reports she hurts all over. Tolerating CLD, denies nausea. Having stool output from ostomy. Understands she needs to mobilize more to build strength back.   Objective: Vital signs in last 24 hours: Temp:  [98.1 F (36.7 C)-100 F (37.8 C)] 98.3 F (36.8 C) (01/29 0724) Pulse Rate:  [106-126] 116 (01/29 0724) Resp:  [15-29] 20 (01/29 0724) BP: (105-169)/(54-93) 159/93 (01/29 0724) SpO2:  [96 %-100 %] 98 % (01/29 0724) Last BM Date: 09/09/18  Intake/Output from previous day: 01/28 0701 - 01/29 0700 In: 2086.2 [P.O.:150; I.V.:1033.8; ZOXWR:604; IV Piggyback:24.4] Out: 2710 [Urine:2450; Drains:85; Stool:175] Intake/Output this shift: No intake/output data recorded.  PE: Gen:  Alert, NAD Card: tachycardic Pulm:  tachypneic  Abd: Soft, appropriately TTP, non-distended; VAC to midline with SS drainage; JP with ss drainage; stoma pink with brown stool in ostomy     Lab Results:  Recent Labs    09/08/18 0505 09/09/18 0500 09/09/18 2201  WBC 2.5* 1.2*  --   HGB 7.1* 6.0* 10.5*  HCT 23.8* 20.8* 34.4*  PLT 20* 16*  --    BMET Recent Labs    09/09/18 0500 09/09/18 1655  NA 153* 152*  K 3.0* 3.8  CL 107 109  CO2 34* 32  GLUCOSE 239* 325*  BUN 22 23  CREATININE 0.61 0.64  CALCIUM 8.4* 8.7*   PT/INR No results for input(s): LABPROT, INR in the last 72 hours. CMP     Component Value Date/Time   NA 152 (H) 09/09/2018 1655   K 3.8 09/09/2018 1655   CL 109 09/09/2018 1655   CO2 32 09/09/2018 1655   GLUCOSE 325 (H) 09/09/2018 1655   BUN 23 09/09/2018 1655   CREATININE 0.64 09/09/2018 1655   CREATININE 0.78 08/15/2017 0902   CALCIUM 8.7 (L) 09/09/2018 1655   PROT 4.9 (L) 09/08/2018 0505   ALBUMIN 3.4 (L) 09/09/2018 1655   AST 50 (H) 09/08/2018 0505   ALT 207 (H) 09/08/2018 0505   ALKPHOS 46 09/08/2018 0505   BILITOT 1.1 09/08/2018 0505   GFRNONAA >60 09/09/2018 1655    GFRNONAA 78 08/15/2017 0902   GFRAA >60 09/09/2018 1655   GFRAA 90 08/15/2017 0902   Lipase     Component Value Date/Time   LIPASE 22 08/19/2018 0732       Studies/Results: No results found.  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 Kieth Brightly -ileus as seen on CT 01/26, improving  - small fluid collection in deep pelvis uncertain if sterile vs abscess, possible in vagina - JP and VAC with SS drainage -continue VAC M/W/F - will re-examine wound later today  - needs to mobilize and get out of bed  FEN: FLD VTE: SCDs;chemical dvt on hold secondary to low plts ID: meropenem 1/19>1/24 Follow up: Dr. Kieth Brightly   Advance to FLD, can 1/2 next TPN and then d/c if continuing to tolerate diet advancement. Mobilize!!!! CSW working  on SNF placement. Will look at wound with WOC later today.   LOS: 10 days    Brigid Re , [facility] 09/10/2018, 7:34 AM Pager: [phone] Consults: [phone] Mon-Fri 7:00 am-4:30 pm Sat-Sun 7:00 am-11:30 am

## 2018-09-10 NOTE — Progress Notes (Signed)
Nutrition Follow-up  DOCUMENTATION CODES:   Morbid obesity  INTERVENTION:  Provide Ensure Enlive po TID, each supplement provides 350 kcal and 20 grams of protein.  TPN per Pharmacy.   Encourage adequate PO intake.   NUTRITION DIAGNOSIS:   Increased nutrient needs related to wound healing, post-op healing as evidenced by estimated needs; ongoing  GOAL:   Patient will meet greater than or equal to 90% of their needs; progressing  MONITOR:   Diet advancement, Labs, Weight trends, Skin, I & O's, PO intake  REASON FOR ASSESSMENT:   Consult New TPN/TNA  ASSESSMENT:   [name] is a 73 y.o. 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 CT of the abdomen pelvis showed a bowel perforation at the level of the descending colon and sigmoid colon with air tracking up to the left pelvis with surrounding mesenteric stranding.  This was thought to be possibly due to ruptured diverticulitis. Pt admitted with diverticulitis with perforation.   1/19- s/p exploratory laparotomy, partial colectomy with end colostomy, splenic mobilization, lysis of adhesions, placement of 100cm^2 vacuum dressing  Diet has been advanced to a full liquid diet. Meal completion has been 10-25%. Pt with abdominal pains and discomfort during time of visit. Plans to wean TPN to rate of 25 ml/hr, as po intake improves, which will provide 533 kcal (32% of needs) and 38 grams of protein. Family at bedside has been encouraging pt po intake at meals. RD to order nutritional supplements to aid in intake as well as wound healing. Pt encouraged to eat her food at meals and to drink her supplements. Per MD, pt with poor prognosis. CT angiogram of the chest done revealed possible worsening metastatic disease. Palliative care has been following to establish goals of  care. Wound VAC to abdomen stopped today as tissue more necrotic. Plans to transition to TID wet to dry dressing.   Labs and medications reviewed.   Diet Order:   Diet Order            Diet full liquid Room service appropriate? Yes; Fluid consistency: Thin  Diet effective now              EDUCATION NEEDS:   Not appropriate for education at this time  Skin:  Skin Assessment: Skin Integrity Issues: Skin Integrity Issues:: Stage II, Other (Comment) Stage II: sacrum Wound Vac: removed Other: wound to abdomen  Last BM:  1/29- 250 ml output via colostomy  Height:   Ht Readings from Last 1 Encounters:  08/25/2018 5\' 3"  (1.6 m)    Weight:   Wt Readings from Last 1 Encounters:  08/17/2018 106.5 kg    Ideal Body Weight:  52.3 kg  BMI:  Body mass index is 41.59 kg/m.  Estimated Nutritional Needs:   Kcal:  1650-1850  Protein:  115-130 grams  Fluid:  > 1.6 L    Melanie Parker, MS, RD, LDN Pager # [phone] After hours/ weekend pager # [phone]

## 2018-09-10 NOTE — Progress Notes (Signed)
PROGRESS NOTE    [name]  FUX:[id number] DOB: June 13, 1946 DOA: 08/18/2018 PCP: Fayrene Helper, MD   Brief Narrative: Patient is a 73 year old female with past medical history of COPD, stage III squamous cell lung cancer, chronic thrombocytopenia, anxiety/depression, diabetes mellitus, diastolic CHF who presented on 1/19 with abdominal pain and diarrhea.  CT abdomen/pelvis showed perforated diverticulitis at the level of descending colon/sigmoid colon.  She underwent colectomy and colostomy by general surgery.  CT Angio of the chest also shows progression of the lung cancer.  Patient remains significantly weak and bedbound.  Palliative care consulted and following given her multiple comorbidities and poor prognosis.  Assessment & Plan:   Active Problems:   Morbid obesity (HCC)   Anxiety and depression   COPD (chronic obstructive pulmonary disease) (HCC)   Squamous cell lung cancer, left (HCC)   Diverticulitis of colon with perforation   Uncontrolled type 2 diabetes mellitus with hyperglycemia (HCC)   S/P exploratory laparotomy   Hypertensive urgency   Pressure injury of skin   Perforated diverticulitis with sepsis on admission: General surgery following.  Status post exploratory laparotomy, partial colectomy with end stage colostomy on 08/28/2018.  Presented with diverticular perforation.  Wound VAC removed today.  Currently on TPN.Mobilization has been encouraged but patient remains weak. Physical therapy recommended skilled nursing facility.  Social worker is following.  Acute hypoxic respiratory failure: Respiratory status might have improved.  Multifactorial.  She has history of COPD, stage III squamous cell lung cancer.  Also has mild expiratory bilateral wheezes.  On oral prednisone.  Currently on oxygen at 1 L/min.  Stage III squamous cell lung cancer: Oncology following.  Was on chemotherapy as an outpatient.  Follows with Dr. Delton Coombes .  CT angiogram of the chest  done here revealed possible worsening metastatic disease.  Pancytopenia: Likely multifactorial.  Associated with recent surgery, chemotherapy, lung cancer, multiple comorbidities.  She was transfused with PRBC .  Hemoglobin of 10.5.  Will check CBC . Hematology following.  Diastolic CHF: Currently euvolemic.  No peripheral edema.  Lasix will be stopped.  Patient might be dehydrated  Hypernatremia: Might be associated with dehydration.  Started on half-normal saline.  Lasix stopped.  Acute kidney injury: AKI has resolved.  COPD: Mild expiratory wheezes on auscultation.  Continue bronchodilators.  Continue prednisone.  Altered mental status: Secondary to metabolic encephalopathy.  Continue to monitor.  Mental status might have improved.  She could say current year.  Sinus tachycardia: We will continue gentle IV fluids  Multiple comorbidities/poor prognosis: Patient has poor prognosis due to her multiple comorbidities.  She has stage III lung cancer.  She is actually declining.  Palliative care is following.  As per the last conversation with palliative care, she wanted to be full code.  We will Continue to discuss with her family about her current situation and prognosis.  Debility/deconditioning: PT evaluated her and recommended skilled nursing facility on discharge.  Social worker following.    Nutrition Problem: Increased nutrient needs Etiology: wound healing, post-op healing      DVT prophylaxis:SCD Code Status: Full code Family Communication: None present at the bedside Disposition Plan: Undetermined at this point   Consultants: General surgery, oncology  Procedures: Expiratory laparotomy  Antimicrobials:  Anti-infectives (From admission, onward)   Start     Dose/Rate Route Frequency Ordered Stop   09/06/2018 1100  meropenem (MERREM) 1 g in sodium chloride 0.9 % 100 mL IVPB  Status:  Discontinued     1 g 200  mL/hr over 30 Minutes Intravenous Every 8 hours 08/15/2018 1010  09/05/18 1019      Subjective: Patient seen and examined the bedside this afternoon.  Remains weak and lethargic.  Mental status might have improved but she is still disoriented.  Hardly able to move in the bedside.  In sinus tachycardia.  Objective: Vitals:   09/10/18 0836 09/10/18 0842 09/10/18 0844 09/10/18 1106  BP: (!) 167/74   (!) 169/81  Pulse: (!) 129   (!) 132  Resp: (!) 26   (!) 33  Temp:    99.3 F (37.4 C)  TempSrc:    Oral  SpO2: 97% 97% 97% 95%  Weight:      Height:        Intake/Output Summary (Last 24 hours) at 09/10/2018 1418 Last data filed at 09/10/2018 1200 Gross per 24 hour  Intake 2212.06 ml  Output 3660 ml  Net -1447.94 ml   Filed Weights   08/23/2018 1350 08/30/2018 2110  Weight: 99.2 kg 106.5 kg    Examination:  General exam: Not in distress, morbidly obese HEENT:PERRL,Oral mucosa moist, Ear/Nose normal on gross exam Respiratory system: Bilateral decreased air entry, bilateral expiratory wheezes Cardiovascular system: Sinus tachycardia, no JVD, murmurs, rubs, gallops or clicks. No pedal edema. Gastrointestinal system: Abdomen is nondistended, soft , colostomy, fresh surgical wound covered with dressing Central nervous system: Alert but not oriented. No focal neurological deficits. Extremities: No edema, no clubbing ,no cyanosis, distal peripheral pulses palpable. Skin: No rashes, lesions or ulcers,no icterus ,no pallor      Data Reviewed: I have personally reviewed following labs and imaging studies  CBC: Recent Labs  Lab 09/05/18 0500 09/05/18 1930 09/06/18 0512 09/07/18 0408 09/08/18 0505 09/09/18 0500 09/09/18 2201  WBC 3.3*  --  4.0 4.7 2.5* 1.2*  --   NEUTROABS 2.7  --  3.4 4.0 1.9 0.9*  --   HGB 7.8*  --  7.9* 8.2* 7.1* 6.0* 10.5*  HCT 25.0*  --  25.5* 26.9* 23.8* 20.8* 34.4*  MCV 90.3  --  91.4 90.6 92.6 95.0  --   PLT 17* 34* 30* 26* 20* 16*  --    Basic Metabolic Panel: Recent Labs  Lab 09/06/18 1120 09/07/18 0408  09/08/18 0505 09/09/18 0500 09/09/18 1655 09/10/18 0500  NA 146* 149* 151* 153* 152* 152*  K 3.6 3.7 3.3* 3.0* 3.8 3.7  CL 112* 112* 112* 107 109 109  CO2 26 30 32 34* 32 29  GLUCOSE 298* 256* 163* 239* 325* 240*  BUN 21 21 21 22 23  26*  CREATININE 0.58 0.62 0.58 0.61 0.64 0.72  CALCIUM 7.7* 8.1* 8.4* 8.4* 8.7* 8.8*  MG 1.6* 1.8 1.9 1.9  --  1.7  PHOS 2.3* 3.4 3.5 4.2 3.7 3.0   GFR: Estimated Creatinine Clearance: 76.4 mL/min (by C-G formula based on SCr of 0.72 mg/dL). Liver Function Tests: Recent Labs  Lab 09/07/18 0408 09/08/18 0505 09/09/18 0500 09/09/18 1655 09/10/18 0500  AST 63* 50*  --   --  55*  ALT 419* 207*  --   --  87*  ALKPHOS 57 46  --   --  58  BILITOT 1.0 1.1  --   --  2.1*  PROT 4.4* 4.9*  --   --  5.5*  ALBUMIN 1.4* 2.6* 3.3* 3.4* 2.8*   No results for input(s): LIPASE, AMYLASE in the last 168 hours. No results for input(s): AMMONIA in the last 168 hours. Coagulation Profile: Recent Labs  Lab 09/05/18  1930  INR 1.22   Cardiac Enzymes: No results for input(s): CKTOTAL, CKMB, CKMBINDEX, TROPONINI in the last 168 hours. BNP (last 3 results) No results for input(s): PROBNP in the last 8760 hours. HbA1C: No results for input(s): HGBA1C in the last 72 hours. CBG: Recent Labs  Lab 09/09/18 1929 09/09/18 2357 09/10/18 0434 09/10/18 0720 09/10/18 1107  GLUCAP 269* 250* 229* 183* 191*   Lipid Profile: Recent Labs    09/08/18 0548  TRIG 95   Thyroid Function Tests: No results for input(s): TSH, T4TOTAL, FREET4, T3FREE, THYROIDAB in the last 72 hours. Anemia Panel: No results for input(s): VITAMINB12, FOLATE, FERRITIN, TIBC, IRON, RETICCTPCT in the last 72 hours. Sepsis Labs: No results for input(s): PROCALCITON, LATICACIDVEN in the last 168 hours.  Recent Results (from the past 240 hour(s))  Culture, blood (routine x 2)     Status: None   Collection Time: 08/22/2018 10:05 PM  Result Value Ref Range Status   Specimen Description BLOOD  RIGHT HAND  Final   Special Requests   Final    BOTTLES DRAWN AEROBIC ONLY Blood Culture adequate volume   Culture   Final    NO GROWTH 5 DAYS Performed at [facility], [address] [place], [place] [address]    Report Status 09/05/2018 FINAL  Final  Culture, blood (routine x 2)     Status: None   Collection Time: 08/26/2018 10:05 PM  Result Value Ref Range Status   Specimen Description BLOOD LEFT HAND  Final   Special Requests   Final    BOTTLES DRAWN AEROBIC ONLY Blood Culture results may not be optimal due to an inadequate volume of blood received in culture bottles   Culture   Final    NO GROWTH 5 DAYS Performed at [facility], [place] [address], [place], [place] [address]    Report Status 09/05/2018 FINAL  Final  MRSA PCR Screening     Status: None   Collection Time: 09/10/18  8:27 AM  Result Value Ref Range Status   MRSA by PCR NEGATIVE NEGATIVE Final    Comment:        The GeneXpert MRSA Assay (FDA approved for NASAL specimens only), is one component of a comprehensive MRSA colonization surveillance program. It is not intended to diagnose MRSA infection nor to guide or monitor treatment for MRSA infections. Performed at [facility], [place] [address] [place], [place] [address]          Radiology Studies: No results found.      Scheduled Meds: . arformoterol  15 mcg Nebulization BID  . budesonide (PULMICORT) nebulizer solution  0.5 mg Nebulization BID  . chlorhexidine  15 mL Mouth Rinse BID  . insulin aspart  0-15 Units Subcutaneous Q4H  . ipratropium-albuterol  3 mL Nebulization BID  . mouth rinse  15 mL Mouth Rinse q12n4p  . methocarbamol  500 mg Oral TID  . metoprolol tartrate  5 mg Intravenous Q6H  . omeprazole  40 mg Oral Daily  . potassium chloride  20 mEq Oral BID  . [START ON 09-19-2018] predniSONE  40 mg Oral Q breakfast  . sodium chloride flush  10-40 mL Intracatheter Q12H   Continuous Infusions: . sodium chloride    .  sodium chloride Stopped (09/08/18 1230)  . lactated ringers 10 mL/hr at 09/09/18 1500  . TPN ADULT (ION) 50 mL/hr at 09/09/18 1747  . TPN ADULT (ION)       LOS:  10 days    Time spent: 35 mins.More than 50% of that time was spent in counseling and/or coordination of care.      Shelly Coss, MD [facility] Pager [phone]  If 7PM-7AM, please contact night-coverage [email or web address] Password [email or web address] 09/10/2018, 2:18 PM

## 2018-09-10 NOTE — Progress Notes (Signed)
I completed a follow up visit with the patient to provide spiritual support. I visited the patient's room and provided spiritual support through pastoral presence, words of encouragement, and prayer. I shared that the Chaplain is available for additional support as needed or requested.    09/10/18 0900  Clinical Encounter Type  Visited With Patient  Visit Type Follow-up;Spiritual support  Spiritual Encounters  Spiritual Needs Prayer  Stress Factors  Patient Stress Factors Exhausted    Chaplain Dr Redgie Grayer

## 2018-09-10 NOTE — Progress Notes (Signed)
PHARMACY - ADULT TOTAL PARENTERAL NUTRITION CONSULT NOTE   Pharmacy Consult:  TPN Indication:  Prolonged ileus  Patient Measurements: Height: '5\' 3"'  (160 cm) Weight: 234 lb 12.6 oz (106.5 kg) IBW/kg (Calculated) : 52.4 TPN AdjBW (KG): 65.9 Body mass index is 41.59 kg/m.  Assessment:  80 YOF presented on 08/15/2018 with abdominal pain and loose stools for 2 weeks that improved minimally with loperamide.  Found to have perforated diverticulitis and underwent ex-lap with partial colectomy, end colostomy and LoA on 09/03/2018.  Patient was started on a clear liquid diet since 09/04/18 and intake has been inadequate per charting.  Pharmacy consulted to manage TPN for post-op ileus.  Patient couldn't stay awake for an interview.  GI: hx GERD on PO Prilosec. Prealbumin remains low at 5.7. Body fluid draining around JP drain, O/P 72m, +BM Endo: DM on metformin and Lantus 50/d PTA, A1c 8.5%.  CBGs starting to improve (HC 510mBID>> prednisone 4079mID 1/28) Insulin requirements in the past 24 hours: 39 units + 50 units in TPN Lytes: 1/28 labs - high Na (none in TPN), K 3.8 (goal >/= 4 for ileus, on Lasix 50m36m BID), others WNL Renal: SCr 0.61 stable, BUN WNL - UOP 1.1 ml/kg/hr, LR at 10 ml/hr, net +2.8L Pulm: COPD.  4L [place] >> RA - Brovana, Pulmicort, Duonebs Cards: HTN/HLD - CTA neg for PE. BP/HR elevated (in Afib) - Lopressor, Lasix 50mg68mBID Hepatobil: AST/ALT down to 50/207, alk phos / tbili / TG WNL Onc: SCC of left lung s/p immunotherapy - pancytopenia Neuro: Bipolar - Robaxin, PRN Morphine/Dilaudid/APAP ID: s/p Merrem - Tmax 100, WBC low 1.2 TPN Access: left chest port per Dr. CorneBrantley StageCC placed 09/06/18 TPN start date: 09/06/18  Nutritional Goals (RD rec on 1/25): 1650-1850 kCal and 115-130gm protein per day  Current Nutrition:  TPN Full liquid diet  Plan:  Reduce TPN to 25 ml/hr (goal rate 80 ml/hr) TPN provides 38g AA, 66g CHO and 16g ILE for a total of 533 kCal, meeting ~32% of  patient's needs Electrolytes in TPN: no Na, Cl:Ac 1:1 - lytes maximized in TPN with reduced TPN rate Daily multivitamin and trace elements in TPN Continue moderate SSI Q4H +reduce regular insulin in TPN to 30 units KCL 50mg 30mID while on scheduled Lasix F/U AM labs, PO intake to wean off of TPN   Sujay Grundman D. Sander Speckman, Mina MarblemD, BCPS, BCCCP [email or web address], 8:34 AM

## 2018-09-10 NOTE — Consult Note (Addendum)
[facility] Nurse wound follow up Wound type:Midline abdominal VAC dressing change. Due to pale wound bed and devitalized tissue present, will begin NS moist kerlix nd ABD pad TID.  Ostomy pouch was changed yesterday and will be left intact Measurement: 22 cm x 8 cm x 6 cm  Wound bed:90% adipose and yellow slough devitalized tissue.  Musty odor.  Drainage (amount, consistency, odor) Moderate serosanguinous   Periwound:LUQ colostomy.  intact Dressing procedure/placement/frequency: NS moist kerlix and cover with ABD pad and tape. TID.  [facility] Nurse ostomy follow up Stoma type/location:  LUQ colostomy.  Pouch changed 1/28 and will not be replaced today.  WOc team will follow.  Domenic Moras MSN, RN, FNP-BC CWON Wound, Ostomy, Continence Nurse Pager [phone]

## 2018-09-10 NOTE — Progress Notes (Signed)
Physical Therapy Treatment Patient Details Name: [name] MRN: [id number] DOB: [birth year] Today's Date: 09/10/2018    History of Present Illness [name] is a 73 y.o. 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  Evaluation in the emergency department including a CT of the abdomen pelvis showed a bowel perforation at the level of the descending colon and sigmoid colon with air tracking up to the left pelvis with surrounding mesenteric stranding. Pt is now s/p  exploratory laparotomy, partial colectomy with end colostomy on 09/09/2018. Hospital course complicated by acute on chronic thrombocytopenia.     PT Comments    Pt with regression from yesterday. Pt was awake, alert and able to tolerate maxisky transfer OOB to recliner. Pt confused today and in 10/10 abdominal pain and was unable to tolerate sitting EOB for more than 3 min. Aware palliative is following patient. Acute PT to cont to follow as desired by pt and family.   Follow Up Recommendations  SNF;Supervision/Assistance - 24 hour     Equipment Recommendations       Recommendations for Other Services       Precautions / Restrictions Precautions Precautions: Fall Precaution Comments: jp drain and colostomy Restrictions Weight Bearing Restrictions: No    Mobility  Bed Mobility Overal bed mobility: Needs Assistance Bed Mobility: Rolling;Sidelying to Sit;Sit to Supine Rolling: Max assist;+2 for physical assistance;+2 for safety/equipment Sidelying to sit: Max assist;+2 for physical assistance   Sit to supine: Total assist;+2 for physical assistance   General bed mobility comments: pt did help with UEs to pull on railing but dependent for trunk elevation, dependent for LE management in/out of bed as well. once seated, pt continuously asking to return  to lying down due to pain, pt moaning and crying  Transfers                 General transfer comment: unable this date  Ambulation/Gait Ambulation/Gait assistance: Min guard           General Gait Details: unable   Stairs             Wheelchair Mobility    Modified Rankin (Stroke Patients Only)       Balance Overall balance assessment: Needs assistance Sitting-balance support: Bilateral upper extremity supported Sitting balance-Leahy Scale: Poor Sitting balance - Comments: despite pain pt with increased ability to hold self up with max encouragement and tactile cues for hand placement. pt requiring min/mod assist, pt not retropulsive but would let UEs give out and fall laterally requiring assist to achieve upright posture. pt only able to tolerate 3 minutes                                    Cognition Arousal/Alertness: Lethargic Behavior During Therapy: Flat affect Overall Cognitive Status: Impaired/Different from baseline Area of Impairment: Following commands;Safety/judgement;Awareness;Problem solving                   Current Attention Level: Focused   Following Commands: Follows one step commands with increased time;Follows one step commands inconsistently Safety/Judgement: Decreased awareness of safety;Decreased awareness of deficits Awareness: Intellectual(unaware she had to pee, urinary incontinence)   General Comments: pt stating yes to any questions and moaning repeatly asking to return to bed once seated EOB  Exercises      General Comments General comments (skin integrity, edema, etc.): HR up to 140 sitting EOB, HR 130 supine upon PT arrival, pt rolled L/R for hygiene s/p urinary incontinent      Pertinent Vitals/Pain Pain Assessment: Faces Faces Pain Scale: Hurts worst Pain Location: abdomen Pain Descriptors / Indicators: Grimacing;Guarding;Moaning Pain Intervention(s): Limited activity within patient's  tolerance    Home Living                      Prior Function            PT Goals (current goals can now be found in the care plan section) Acute Rehab PT Goals Patient Stated Goal: didn't state Progress towards PT goals: Not progressing toward goals - comment    Frequency    Min 3X/week      PT Plan Current plan remains appropriate    Co-evaluation              AM-PAC PT "6 Clicks" Mobility   Outcome Measure  Help needed turning from your back to your side while in a flat bed without using bedrails?: Total Help needed moving from lying on your back to sitting on the side of a flat bed without using bedrails?: Total Help needed moving to and from a bed to a chair (including a wheelchair)?: Total Help needed standing up from a chair using your arms (e.g., wheelchair or bedside chair)?: Total Help needed to walk in hospital room?: Total Help needed climbing 3-5 steps with a railing? : Total 6 Click Score: 6    End of Session Equipment Utilized During Treatment: Oxygen Activity Tolerance: Patient limited by pain Patient left: in bed;with call bell/phone within reach;with bed alarm set Nurse Communication: Mobility status PT Visit Diagnosis: Other abnormalities of gait and mobility (R26.89);Muscle weakness (generalized) (M62.81);Pain     Time: 1610-9604 PT Time Calculation (min) (ACUTE ONLY): 15 min  Charges:  $Therapeutic Activity: 8-22 mins                     Kittie Plater, PT, DPT Acute Rehabilitation Services Pager #: [phone] Office #: [phone]    Berline Lopes 09/10/2018, 1:50 PM

## 2018-09-10 NOTE — Progress Notes (Signed)
Hematology oncology  I discussed the case with Dr. Delton Coombes yesterday and reviewed the imaging studies. Patient does have significant progression of disease in the lungs. Given her current pancytopenia and worsening disease, patient's options are limited with regards to treatment. Dr. Delton Coombes will discuss with the family about pursuing palliative care/hospice options. In the meantime continue with supportive care with transfusions as needed.

## 2018-09-10 NOTE — NC FL2 (Addendum)
[place] LEVEL OF CARE SCREENING TOOL     IDENTIFICATION  Patient Name: [name] Birthdate: [birth year] Sex: female Admission Date (Current Location): 09/09/2018  [facility] and [place] Number:  Herbalist and Address:  The . [facility], [place] [address] [place], [place] [address]      Provider Number: [id number]  Attending Physician Name and Address:  Shelly Coss, MD  Relative Name and Phone Number:  [name]; husband; [phone]    Current Level of Care: Hospital Recommended Level of Care: [place] Prior Approval Number:    Date Approved/Denied:   PASRR Number: pending  Discharge Plan: SNF    Current Diagnoses: Patient Active Problem List   Diagnosis Date Noted  . Diverticulitis of colon with perforation 08/16/2018  . Uncontrolled type 2 diabetes mellitus with hyperglycemia ([place]) 08/26/2018  . Pressure injury of skin 08/23/2018  . S/P exploratory laparotomy   . Hypertensive urgency   . Diabetes mellitus, insulin dependent (IDDM), uncontrolled ([place]) 08/28/2018  . Recurrent falls while walking 08/28/2018  . Hyperglycemia 08/08/2018  . Leukocytosis 08/08/2018  . Thrombocytopenia ([place]) 08/08/2018  . SOB (shortness of breath) 07/22/2018  . Chest pain 07/22/2018  . Chronic pain 07/22/2018  . Chronic constipation 07/22/2018  . Physical deconditioning 07/22/2018  . Family history of stomach cancer   . Family history of lung cancer   . 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 Hospital discharge follow-up 11/09/2017  . Squamous cell lung cancer, left ([place]) 11/01/2017  . COPD (chronic obstructive pulmonary disease) ([place]) 10/29/2017  . Emphysema lung ([place]) 02/26/2017  . Mass of upper lobe of left lung 02/26/2017  . Encounter for chronic pain management 08/26/2016  . Anemia, deficiency 07/27/2016  . H/O nicotine dependence 12/05/2014  . Anxiety and depression 12/31/2013  .  Female bladder prolapse 03/10/2013  . Impaired fasting glucose 10/03/2010  . Vitamin D deficiency 09/08/2009  . Back pain with sciatica 04/04/2009  . HTN (hypertension) 06/09/2008  . ABDOMINAL WALL HERNIA 06/09/2008  . HLD (hyperlipidemia) 11/14/2007  . Morbid obesity ([place]) 11/14/2007  . Anxiety 11/14/2007  . GERD (gastroesophageal reflux disease) 11/14/2007    Orientation RESPIRATION BLADDER Height & Weight     Self, Place  O2 Incontinent, External catheter Weight: 234 lb 12.6 oz (106.5 kg) Height:  5\' 3"  (160 cm)  BEHAVIORAL SYMPTOMS/MOOD NEUROLOGICAL BOWEL NUTRITION STATUS      Colostomy(1pc.soft convex pouch -1 and 1/4 inch x 1 and 5/8 inch) Diet  AMBULATORY STATUS COMMUNICATION OF NEEDS Skin   Extensive Assist Verbally PU Stage and Appropriate Care, Wound Vac(KCI Wound Vac Continuous 125 )- wound measurements are 22 cm x 8 cm x 6 cm                        Personal Care Assistance Level of Assistance  Bathing, Feeding, Dressing Bathing Assistance: Maximum assistance Feeding assistance: Limited assistance Dressing Assistance: Maximum assistance     Functional Limitations Info  Sight, Hearing, Speech Sight Info: Adequate Hearing Info: Adequate Speech Info: Adequate    SPECIAL CARE FACTORS FREQUENCY  PT (By licensed PT), OT (By licensed OT)     PT Frequency: 5x week OT Frequency: 5x week            Contractures Contractures Info: Not present    Additional Factors Info  Code Status, Allergies, Insulin Sliding Scale Code Status Info: Full Code Allergies Info: BEE VENOM,  PENICILLINS, CODEINE, MOTRIN IBUPROFEN    Insulin Sliding Scale Info: insulin aspart (novoLOG) injection 0-5 Units daily at bedtime; insulin aspart (novoLOG) injection 0-9 Units 3x daily with meals       Current Medications (09/10/2018):  This is the current hospital active medication list Current Facility-Administered Medications  Medication Dose Route Frequency Provider Last Rate Last  Dose  . 0.45 % sodium chloride infusion   Intravenous Continuous Adhikari, Amrit, MD      . 0.9 %  sodium chloride infusion   Intravenous PRN Corey Harold, NP   Stopped at 09/08/18 1230  . acetaminophen (TYLENOL) tablet 650 mg  650 mg Oral Q6H PRN Kinsinger, Arta Bruce, MD       Or  . acetaminophen (TYLENOL) suppository 650 mg  650 mg Rectal Q6H PRN Kinsinger, Arta Bruce, MD   650 mg at 09/09/18 0110  . albuterol (PROVENTIL) (2.5 MG/3ML) 0.083% nebulizer solution 2.5 mg  2.5 mg Nebulization Q4H PRN Omar Person, NP   2.5 mg at 09/05/18 1044  . arformoterol (BROVANA) nebulizer solution 15 mcg  15 mcg Nebulization BID Omar Person, NP   15 mcg at 09/10/18 0836  . budesonide (PULMICORT) nebulizer solution 0.5 mg  0.5 mg Nebulization BID Kinsinger, Arta Bruce, MD   0.5 mg at 09/10/18 0836  . chlorhexidine (PERIDEX) 0.12 % solution 15 mL  15 mL Mouth Rinse BID Kinsinger, Arta Bruce, MD   15 mL at 09/10/18 0923  . HYDROmorphone (DILAUDID) injection 1 mg  1 mg Intravenous Q2H PRN Rayburn, Kelly A, PA-C   1 mg at 09/10/18 1122  . insulin aspart (novoLOG) injection 0-15 Units  0-15 Units Subcutaneous Q4H Tyrone Apple, [facility]   3 Units at 09/10/18 1121  . ipratropium-albuterol (DUONEB) 0.5-2.5 (3) MG/3ML nebulizer solution 3 mL  3 mL Nebulization BID Kinsinger, Arta Bruce, MD   3 mL at 09/10/18 0836  . lactated ringers infusion   Intravenous Continuous Romona Curls, RPH 10 mL/hr at 09/09/18 1500    . MEDLINE mouth rinse  15 mL Mouth Rinse q12n4p Kinsinger, Arta Bruce, MD   15 mL at 09/10/18 1124  . methocarbamol (ROBAXIN) tablet 500 mg  500 mg Oral TID Rayburn, Kelly A, PA-C   500 mg at 09/10/18 0923  . metoprolol tartrate (LOPRESSOR) injection 5 mg  5 mg Intravenous Q6H Bodenheimer, Charles A, NP   5 mg at 09/10/18 1117  . omeprazole (PRILOSEC) capsule 40 mg  40 mg Oral Daily Harvel Quale, RPH   40 mg at 09/10/18 8295  . ondansetron (ZOFRAN) tablet 4 mg  4 mg Oral Q6H PRN Kinsinger,  Arta Bruce, MD       Or  . ondansetron [facility]) injection 4 mg  4 mg Intravenous Q6H PRN Kinsinger, Arta Bruce, MD      . oxyCODONE (Oxy IR/ROXICODONE) immediate release tablet 5-10 mg  5-10 mg Oral Q4H PRN Rayburn, Kelly A, PA-C   5 mg at 09/10/18 0923  . potassium chloride SA (K-DUR,KLOR-CON) CR tablet 20 mEq  20 mEq Oral BID Tyrone Apple, RPH   20 mEq at 09/10/18 6213  . [START ON 09/17/18] predniSONE (DELTASONE) tablet 40 mg  40 mg Oral Q breakfast Adhikari, Amrit, MD      . sodium chloride flush (NS) 0.9 % injection 10-40 mL  10-40 mL Intracatheter Q12H [place] Shi, MD   10 mL at 09/10/18 0923  . sodium chloride flush (NS) 0.9 % injection 10-40 mL  10-40  mL Intracatheter PRN [place] Shi, MD      . TPN ADULT (ION)   Intravenous Continuous TPN Romona Curls, RPH 50 mL/hr at 09/09/18 1747    . TPN ADULT (ION)   Intravenous Continuous TPN Tyrone Apple, [facility]         Discharge Medications: Please see discharge summary for a list of discharge medications.  Relevant Imaging Results:  Relevant Lab Results:   Additional Information [id number] [place], [place]

## 2018-09-11 ENCOUNTER — Inpatient Hospital Stay (HOSPITAL_COMMUNITY): Payer: PPO

## 2018-09-11 ENCOUNTER — Other Ambulatory Visit: Payer: Self-pay

## 2018-09-11 LAB — BLOOD GAS, ARTERIAL
Acid-Base Excess: 1.9 mmol/L (ref 0.0–2.0)
Bicarbonate: 26.1 mmol/L (ref 20.0–28.0)
Drawn by: 31101
FIO2: 100
O2 Saturation: 93.8 %
Patient temperature: 98.6
pCO2 arterial: 41.8 mmHg (ref 32.0–48.0)
pH, Arterial: 7.412 (ref 7.350–7.450)
pO2, Arterial: 75.1 mmHg — ABNORMAL LOW (ref 83.0–108.0)

## 2018-09-11 LAB — CBC WITH DIFFERENTIAL/PLATELET
Band Neutrophils: 0 %
Basophils Absolute: 0 10*3/uL (ref 0.0–0.1)
Basophils Relative: 1 %
Blasts: 0 %
Eosinophils Absolute: 0 10*3/uL (ref 0.0–0.5)
Eosinophils Relative: 0 %
HCT: 36.4 % (ref 36.0–46.0)
Hemoglobin: 10.7 g/dL — ABNORMAL LOW (ref 12.0–15.0)
Lymphocytes Relative: 17 %
Lymphs Abs: 0.4 10*3/uL — ABNORMAL LOW (ref 0.7–4.0)
MCH: 27.7 pg (ref 26.0–34.0)
MCHC: 29.4 g/dL — ABNORMAL LOW (ref 30.0–36.0)
MCV: 94.3 fL (ref 80.0–100.0)
MONOS PCT: 7 %
Metamyelocytes Relative: 0 %
Monocytes Absolute: 0.1 10*3/uL (ref 0.1–1.0)
Myelocytes: 0 %
NEUTROS ABS: 1.6 10*3/uL — AB (ref 1.7–7.7)
NEUTROS PCT: 75 %
Other: 0 %
Platelets: 22 10*3/uL — CL (ref 150–400)
Promyelocytes Relative: 0 %
RBC: 3.86 MIL/uL — ABNORMAL LOW (ref 3.87–5.11)
RDW: 17.4 % — ABNORMAL HIGH (ref 11.5–15.5)
WBC: 2.1 10*3/uL — ABNORMAL LOW (ref 4.0–10.5)
nRBC: 0 /100 WBC
nRBC: 70.6 % — ABNORMAL HIGH (ref 0.0–0.2)

## 2018-09-11 LAB — COMPREHENSIVE METABOLIC PANEL
ALK PHOS: 59 U/L (ref 38–126)
ALT: 67 U/L — ABNORMAL HIGH (ref 0–44)
AST: 62 U/L — ABNORMAL HIGH (ref 15–41)
Albumin: 1.9 g/dL — ABNORMAL LOW (ref 3.5–5.0)
Anion gap: 10 (ref 5–15)
BILIRUBIN TOTAL: 1.8 mg/dL — AB (ref 0.3–1.2)
BUN: 41 mg/dL — ABNORMAL HIGH (ref 8–23)
CO2: 27 mmol/L (ref 22–32)
Calcium: 7.8 mg/dL — ABNORMAL LOW (ref 8.9–10.3)
Chloride: 114 mmol/L — ABNORMAL HIGH (ref 98–111)
Creatinine, Ser: 1.48 mg/dL — ABNORMAL HIGH (ref 0.44–1.00)
GFR calc Af Amer: 41 mL/min — ABNORMAL LOW (ref >60)
GFR calc non Af Amer: 36 mL/min — ABNORMAL LOW (ref >60)
Glucose, Bld: 66 mg/dL — ABNORMAL LOW (ref 70–99)
Potassium: 4.8 mmol/L (ref 3.5–5.1)
Sodium: 151 mmol/L — ABNORMAL HIGH (ref 135–145)
TOTAL PROTEIN: 4.4 g/dL — AB (ref 6.5–8.1)

## 2018-09-11 LAB — GLUCOSE, CAPILLARY
GLUCOSE-CAPILLARY: 127 mg/dL — AB (ref 70–99)
Glucose-Capillary: 15 mg/dL — CL (ref 70–99)
Glucose-Capillary: 95 mg/dL (ref 70–99)

## 2018-09-11 LAB — PHOSPHORUS: Phosphorus: 4.7 mg/dL — ABNORMAL HIGH (ref 2.5–4.6)

## 2018-09-11 LAB — MAGNESIUM: Magnesium: 1.7 mg/dL (ref 1.7–2.4)

## 2018-09-11 MED ORDER — ACETAMINOPHEN 650 MG RE SUPP: 975.0000 mg | Freq: Once | RECTAL | Status: DC

## 2018-09-11 MED ORDER — VANCOMYCIN HCL 10 G IV SOLR
2000.0000 mg | Freq: Once | INTRAVENOUS | Status: AC
  Administered 2018-09-11: 2000 mg via INTRAVENOUS
  Filled 2018-09-11: qty 2000

## 2018-09-11 MED ORDER — FUROSEMIDE 10 MG/ML IJ SOLN
40.0000 mg | Freq: Once | INTRAMUSCULAR | Status: AC
  Administered 2018-09-11: 40 mg via INTRAVENOUS
  Filled 2018-09-11: qty 4

## 2018-09-11 MED ORDER — DEXTROSE 5 % IV SOLN: INTRAVENOUS | Status: DC

## 2018-09-11 MED ORDER — SODIUM CHLORIDE 0.9 % IV SOLN
2.0000 g | Freq: Once | INTRAVENOUS | Status: AC
  Administered 2018-09-11: 2 g via INTRAVENOUS
  Filled 2018-09-11: qty 2

## 2018-09-11 MED ORDER — VANCOMYCIN VARIABLE DOSE PER UNSTABLE RENAL FUNCTION (PHARMACIST DOSING): Status: DC

## 2018-09-11 MED ORDER — SODIUM CHLORIDE 0.9 % IV SOLN: 2.0000 g | INTRAVENOUS | Status: DC

## 2018-09-11 MED ORDER — DEXTROSE 50 % IV SOLN
25.0000 g | INTRAVENOUS | Status: AC
  Administered 2018-09-11: 25 g via INTRAVENOUS

## 2018-09-12 LAB — PATHOLOGIST SMEAR REVIEW

## 2018-09-13 NOTE — Progress Notes (Addendum)
Blood sugar 15 this am. Hypoglycemia protocol initiated. Critical lab platelets 22, FYI paged to [facility], Baltazar Najjar. Awaiting call back. Rapid response nurse made aware.  MD Baltazar Najjar called back and report given. No new orders at this time.   0445-Blood sugar 127.   0545- [name], patient's son at bedside.

## 2018-09-13 NOTE — Death Summary Note (Signed)
Death Summary  [name] QMV:[id number] DOB: [birth year] DOA: September 06, 2018  PCP: Fayrene Helper, MD  Admit date: 09/06/2018 Date of Death: 09/17/18 Time of [id number] Notification: Fayrene Helper, MD notified of death of 2018/09/17   History of present illness:  Patient is a 73 year old female with past medical history of COPD, stage III squamous cell lung cancer, chronic thrombocytopenia, anxiety/depression, diabetes mellitus, diastolic CHF who presented on 09/06/22 with abdominal pain and diarrhea.  CT abdomen/pelvis showed perforated diverticulitis at the level of descending colon/sigmoid colon.  She underwent colectomy and colostomy by general surgery.  CT Angio of the chest also shows progression of the lung cancer.  Patient remains significantly weak and bedbound.  Palliative care consulted and following given her multiple comorbidities and poor prognosis.  Despite multiple conversation with family and patient patient remained full code. Her hospital course was remarkable for acute hypoxic respiratory failure which could be secondary to multiple etiologies including her lung cancer.  She also had  severe pancytopenia and was transfused with PRBC.  Oncology was following.  Patient also developed severe hypernatremia during this hospital stay.  Seen remains significantly altered secondary to metabolic encephalopathy.  Hospital course also remarkable for sinus tachycardia. Patient progressively declined.  On 09/17/18, she was found to have severe sinus tachycardia with heart rate ranging from 150s to 160s.  Respiratory 36.  Progressively desaturated.  Became hypotensive.  90 stop discussed with the family and she was made DNR.  She expired at 0717.   Final Diagnoses:  1.   Severe sepsis secondary to perforated diverticulitis   The results of significant diagnostics from this hospitalization (including imaging, microbiology, ancillary and laboratory) are listed below for reference.     Significant Diagnostic Studies: Ct Head Wo Contrast  Result Date: 09/06/2018 CLINICAL DATA:  Encephalopathy EXAM: CT HEAD WITHOUT CONTRAST TECHNIQUE: Contiguous axial images were obtained from the base of the skull through the vertex without intravenous contrast. COMPARISON:  None. FINDINGS: Brain: There is no mass, hemorrhage or extra-axial collection. The size and configuration of the ventricles and extra-axial CSF spaces are normal. The brain parenchyma is normal, without acute or chronic infarction. Vascular: No abnormal hyperdensity of the major intracranial arteries or dural venous sinuses. No intracranial atherosclerosis. Skull: The visualized skull base, calvarium and extracranial soft tissues are normal. Sinuses/Orbits: No fluid levels or advanced mucosal thickening of the visualized paranasal sinuses. No mastoid or middle ear effusion. The orbits are normal. IMPRESSION: Normal aging brain. Electronically Signed   By: Ulyses Jarred M.D.   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 Chronic thrombocytopenia. Clinical concern for pulmonary embolism. EXAM: CT ANGIOGRAPHY CHEST WITH CONTRAST TECHNIQUE: Multidetector CT imaging of the chest was performed using the standard protocol during bolus administration of intravenous contrast. Multiplanar CT image reconstructions and MIPs were obtained to evaluate the vascular anatomy. CONTRAST:  176mL ISOVUE-370 IOPAMIDOL (ISOVUE-370) INJECTION 76% COMPARISON:  Portable chest dated 09/01/2018. Chest CTA dated 07/23/2018. FINDINGS: Cardiovascular: Satisfactory opacification of the pulmonary arteries to the segmental level. No evidence of pulmonary embolism. Normal heart size. No pericardial effusion. Mild atheromatous aortic calcifications. Mediastinum/Nodes: No enlarged mediastinal, right hilar, or axillary lymph nodes. Thyroid gland, trachea, and  esophagus demonstrate no significant findings. Lungs/Pleura: Moderate-sized left pleural effusion, increased. Irregular left upper lobe mass with 2 confluent components, measuring a combined 3.6 x 3.0 cm on image number 40 series  6, previously 3.0 x 1.5 cm. This continues to extend to the superior left hilum. More superiorly in the left upper lobe, there is a 10 mm nodule on image number 29 series 6, previously 5 mm. In the anterior aspect of the left upper lobe, there is a 1.6 x 1.6 cm nodule on image number 27 series 6, previously 6 mm. There is also interval confluent soft tissue density encasing the posterior left hilum, including the left lower lobe bronchi and pulmonary arteries. This is causing moderate to marked bronchial narrowing. Interval small right upper lobe nodule measuring 6 mm on image number 38 series 6. Interval two adjacent right lower lobe nodules, 1 measuring 11 mm and the other measuring 6 mm on image number 66 series 6. Mild left lower lobe and left upper lobe atelectasis is noted. Upper Abdomen: Cholecystectomy clips. Normal appearing adrenal glands. No visible liver masses. Musculoskeletal: Unremarkable bones. Review of the MIP images confirms the above findings. IMPRESSION: 1. No pulmonary emboli. 2. Interval increase in size of the left upper lobe mass, as described above, compatible with the patient's known primary lung carcinoma. 3. Interval increase hilar invasion of the left lung mass with a significant new component encasing the left lower lobe bronchi and arteries with moderate to marked bronchial narrowing. 4. Progressive metastatic disease in the left upper lobe with interval metastatic disease in the right upper lobe and right lower lobe. 5. Moderate-sized left pleural effusion, increased. 6. Mild left lower lobe and left upper lobe atelectasis. Aortic Atherosclerosis (ICD10-I70.0). Electronically Signed   By: Claudie Revering M.D.   On: 09/07/2018 18:54   Ct Abdomen Pelvis W  Contrast  Result Date: 09/07/2018 CLINICAL DATA:  Abdominal pain. Review of electronic records demonstrates colectomy 09/07/2018. EXAM: CT ABDOMEN AND PELVIS WITH CONTRAST TECHNIQUE: Multidetector CT imaging of the abdomen and pelvis was performed using the standard protocol following bolus administration of intravenous contrast. CONTRAST:  15mL OMNIPAQUE IOHEXOL 300 MG/ML  SOLN COMPARISON:  Preoperative CT 09/10/2018. PET-CT 11/13/2017 FINDINGS: Lower chest: Small to moderate left pleural effusion with adjacent compressive atelectasis in the left lower lobe. Trace right pleural effusion and right basilar atelectasis. Nodularity in the region of linear atelectasis in the right lower lobe spanning 9 mm was not present on recent prior exam and likely nodular atelectasis. Hepatobiliary: No focal liver abnormality is seen. Status post cholecystectomy. Stable biliary prominence from prior. Pancreas: No ductal dilatation or inflammation. Spleen: Normal in size without focal abnormality. Posterior splenic cleft, incidental. Adrenals/Urinary Tract: Normal adrenal glands. No hydronephrosis or perinephric edema. Homogeneous renal enhancement with symmetric excretion on delayed phase imaging. Urinary bladder is decompressed. Stomach/Bowel: Recent laparotomy with transverse colostomy in the left lower quadrant. Stapled off sigmoid colon in the pelvis is unremarkable. Surgical drain remains in place, tip in the left pericolic gutter. Mild wall thickening of the ascending and proximal transverse colon. Small bowel in the central abdomen are dilated and fluid-filled with air-fluid levels, no abnormal gastric distension. Vascular/Lymphatic: Normal caliber abdominal aorta. No adenopathy. Reproductive: Status post hysterectomy. No adnexal masses. Other: Post recent midline laparotomy. Small amount of free fluid in the left pericolic gutter and left upper quadrant. Patchy edema primarily in the left abdomen may represent an element  of fat necrosis postop. Crescentic 4.3 cm fluid in the midline deep pelvis may represent fluid in the vagina or small pelvic fluid collection. No other focal fluid collection or abscess. No residual free air. Soft tissue edema in the bilateral flanks, right greater  than left. Skin thickening and soft tissue edema about the lateral left hip. Musculoskeletal: Multilevel degenerative change throughout spine. Degenerative change in both hips. There are no acute or suspicious osseous abnormalities. IMPRESSION: 1. Post recent midline laparotomy with transverse colostomy in the left lower quadrant. Expected postsurgical changes small amount of free fluid in patchy edema in the left abdomen. 2. Crescentic 4.3 cm fluid collection in the midline deep pelvis may represent fluid in the vagina or small pelvic fluid collection, sterility indeterminate by imaging. No other focal fluid collection. 3. Dilated fluid-filled central small bowel suggesting postoperative ileus. 4. Mild wall thickening of the ascending and proximal transverse colon, reactive wall thickening versus colitis. 5. Small to moderate left pleural effusion with adjacent compressive atelectasis. Right lower lobe atelectasis is a slightly nodular configuration, but is new from prior exam and likely more focal atelectatic change. Attention to this at follow-up recommended given history of lung cancer. Electronically Signed   By: Keith Rake M.D.   On: 09/07/2018 01:04   Ct Abdomen Pelvis W Contrast  Result Date: 09/09/2018 CLINICAL DATA:  Generalized abdominal pain. History of lung carcinoma EXAM: CT ABDOMEN AND PELVIS WITH CONTRAST TECHNIQUE: Multidetector CT imaging of the abdomen and pelvis was performed using the standard protocol following bolus administration of intravenous contrast. CONTRAST:  164mL ISOVUE-300 IOPAMIDOL (ISOVUE-300) INJECTION 61% COMPARISON:  PET-CT November 13, 2017 FINDINGS: Lower chest: Lung bases are clear. Hepatobiliary: No focal  liver lesions are evident. Gallbladder is absent. The common hepatic is dilated at 12 mm. There is tapering distally without biliary duct mass or calculus. No intrahepatic biliary duct dilatation is evident. Pancreas: There is no pancreatic mass or inflammatory focus. Spleen: No splenic lesions are evident. Adrenals/Urinary Tract: Adrenals appear normal bilaterally. Kidneys bilaterally show no evident mass or hydronephrosis on either side. There is no appreciable renal or ureteral calculus on either side. Urinary bladder is midline with wall thickness within normal limits. Stomach/Bowel: There is evident bowel perforation at the level of the junction of the descending colon and sigmoid colon. Soft tissue air tracks from this area into the left pelvis with surrounding mesenteric stranding. Free air is seen extending throughout the left abdomen into the mid and upper abdominal regions, reaching the medial left hemidiaphragm near the level of the stomach. There is no associated mass by CT in this area. Elsewhere, no other areas of perforation or evident. There is fluid throughout much of the small bowel which may be indicative of a degree of ileus secondary to the bowel perforation more distally. No portal venous air is evident. Vascular/Lymphatic: There is aortic atherosclerosis. There is also right common iliac artery atherosclerosis. Major mesenteric arterial vessels appear patent. No adenopathy is appreciable in the abdomen or pelvis. Reproductive: The uterus is absent.  No pelvic masses evident. Other: There is no periappendiceal region inflammation. A surgical clip is seen in the periappendiceal region. Appendix not seen. No abscess or ascites is evident in the abdomen or pelvis. Patient has had previous hernia repair in the periumbilical region with areas of mesh present. Musculoskeletal: There is multilevel arthropathy in the lumbar spine. No blastic or lytic bone lesions are evident. No intramuscular lesions  are evident. IMPRESSION: 1. Evident bowel perforation at the junction of the descending colon and sigmoid colon with extensive free air both loculated in the left pelvis as well as extending into the left upper and mid abdomen with free air abutting the medial left hemidiaphragm. Suspect ruptured diverticulitis. No mass seen. It  is conceivable that a neoplastic lesion in the colon resulted in bowel wall rupture. 2.  Suspect underlying ileus.  No frank bowel obstruction. 3. Status post cholecystectomy. Prominence of the common hepatic duct with tapering of the common bile duct distally noted. No mass or calculus noted in the biliary ductal system. 4.  No evident renal or ureteral calculus.  No hydronephrosis. 5.  Aortoiliac atherosclerosis. 6.  Status post ventral hernia repair with mesh in place. 7.  Extensive multilevel lumbar arthropathy. Critical Value/emergent results were called by telephone at the time of interpretation on 08/23/2018 at 9:38 am to Dr. Francine Graven , who verbally acknowledged these results. Electronically Signed   By: Lowella Grip III M.D.   On: 08/19/2018 09:38   Dg Chest Port 1 View  Result Date: 10/03/18 CLINICAL DATA:  Respiratory distress EXAM: PORTABLE CHEST 1 VIEW COMPARISON:  09/08/2018, 09/01/2018, CT 09/07/2018, radiograph 08/08/2018 FINDINGS: Left-sided central venous port tip over the SVC. Right upper extremity central venous catheter tip over the SVC. Post sternotomy changes. Left upper lobe lung mass and consolidation, similar as compared with 09/08/2018. Small left-sided pleural effusion. Continued consolidation at the left base. Worsened airspace disease at the right base with probable small right effusion. Stable heart size. No pneumothorax. IMPRESSION: 1. Development of trace right pleural effusion and airspace disease at the right base which may reflect atelectasis or pneumonia 2. Similar appearance of small left effusion, dense airspace disease at the left  base, and left upper lobe lung mass. 3. Cardiomegaly Electronically Signed   By: Donavan Foil M.D.   On: 10/03/18 02:36   Dg Chest Port 1 View  Result Date: 09/08/2018 CLINICAL DATA:  Respiratory failure. EXAM: PORTABLE CHEST 1 VIEW COMPARISON:  CT chest 09/07/2018 and one-view chest x-ray 09/01/2018 FINDINGS: A right-sided PICC line is been placed. The tip terminates in the distal SVC. Left subclavian Port-A-Cath is stable. Left upper lobe mass is again noted. There is volume loss with elevation of left hemidiaphragm. Mild asymmetric left-sided edema is superimposed. IMPRESSION: 1. Left upper lobe mass. 2. Mild left-sided edema. 3. Interval placement of right-sided PICC line. Electronically Signed   By: San Morelle M.D.   On: 09/08/2018 07:56   Dg Chest Port 1 View  Result Date: 09/01/2018 CLINICAL DATA:  Initial evaluation for acute respiratory failure, pneumonia. Status post abdominal surgery with history of lung cancer. EXAM: PORTABLE CHEST 1 VIEW COMPARISON:  Prior radiograph from 09/02/2018 FINDINGS: Enteric tube courses into the abdomen. Left-sided Port-A-Cath with tip overlying the mid SVC. Stable heart size. Mediastinal silhouette normal. Lungs hypoinflated with elevation left hemidiaphragm. Progressive infiltrate within the mid left lung, compatible with pneumonia. Underlying left upper lobe nodular densities grossly similar to previous. Right lung remains clear. No edema. Probable small left pleural effusion. No pneumothorax. Osseous structures unchanged. IMPRESSION: 1. Progressive mid left lung infiltrate, consistent with pneumonia. 2. Probable small left pleural effusion. Electronically Signed   By: Jeannine Boga M.D.   On: 09/01/2018 06:20   Dg Acute Abd 2+v Abd (supine,erect,decub) + 1v Chest  Result Date: 09/06/2018 CLINICAL DATA:  Shortness of breath and diarrhea EXAM: DG ABDOMEN ACUTE W/ 1V CHEST COMPARISON:  Chest radiograph August 08, 2018; PET-CT November 13, 2017  FINDINGS: PA chest: The nodular opacities in the left upper lobe are slightly less well evident than on most recent chest radiograph. There is atelectatic change in the left mid and upper lung zones. There is patchy infiltrate in the left mid lung currently. Right  lung appears clear. Heart size and pulmonary vascularity are normal. Port-A-Cath tip is in the superior vena cava. No pneumothorax. Patient is status post median sternotomy. No adenopathy. No bone lesions. Supine and upright abdomen: There is moderate stool in the colon. There is no bowel dilatation or air-fluid level to suggest bowel obstruction. No free air. There is degenerative change in the lumbar spine. Postoperative changes are noted in the left abdomen. IMPRESSION: Suspect early pneumonia left mid lung. The known nodular lesions in the left upper lobe are less well apparent currently than on recent prior chest radiograph. Right lung is clear. Stable cardiac silhouette. No pneumothorax. No bowel obstruction or free air. Moderate stool in colon. Postoperative changes noted. Electronically Signed   By: Lowella Grip III M.D.   On: 09/06/2018 07:25   [place] Ekg Site Rite  Result Date: 09/06/2018 If Site Rite image not attached, placement could not be confirmed due to current cardiac rhythm.   Microbiology: Recent Results (from the past 240 hour(s))  MRSA PCR Screening     Status: None   Collection Time: 09/10/18  8:27 AM  Result Value Ref Range Status   MRSA by PCR NEGATIVE NEGATIVE Final    Comment:        The GeneXpert MRSA Assay (FDA approved for NASAL specimens only), is one component of a comprehensive MRSA colonization surveillance program. It is not intended to diagnose MRSA infection nor to guide or monitor treatment for MRSA infections. Performed at [facility], [place] [address] [place], [place] [address]      Labs: Basic Metabolic Panel: Recent Labs  Lab 09/07/18 0408 09/08/18 0505 09/09/18 0500  09/09/18 1655 09/10/18 0500 September 27, 2018 0314  NA 149* 151* 153* 152* 152* 151*  K 3.7 3.3* 3.0* 3.8 3.7 4.8  CL 112* 112* 107 109 109 114*  CO2 30 32 34* 32 29 27  GLUCOSE 256* 163* 239* 325* 240* 66*  BUN 21 21 22 23  26* 41*  CREATININE 0.62 0.58 0.61 0.64 0.72 1.48*  CALCIUM 8.1* 8.4* 8.4* 8.7* 8.8* 7.8*  MG 1.8 1.9 1.9  --  1.7 1.7  PHOS 3.4 3.5 4.2 3.7 3.0 4.7*   Liver Function Tests: Recent Labs  Lab 09/07/18 0408 09/08/18 0505 09/09/18 0500 09/09/18 1655 09/10/18 0500 09/27/18 0314  AST 63* 50*  --   --  55* 62*  ALT 419* 207*  --   --  87* 67*  ALKPHOS 57 46  --   --  58 59  BILITOT 1.0 1.1  --   --  2.1* 1.8*  PROT 4.4* 4.9*  --   --  5.5* 4.4*  ALBUMIN 1.4* 2.6* 3.3* 3.4* 2.8* 1.9*   No results for input(s): LIPASE, AMYLASE in the last 168 hours. No results for input(s): AMMONIA in the last 168 hours. CBC: Recent Labs  Lab 09/07/18 0408 09/08/18 0505 09/09/18 0500 09/09/18 2201 09/10/18 1426 09/27/18 0313  WBC 4.7 2.5* 1.2*  --  1.7* 2.1*  NEUTROABS 4.0 1.9 0.9*  --  1.1* 1.6*  HGB 8.2* 7.1* 6.0* 10.5* 11.4* 10.7*  HCT 26.9* 23.8* 20.8* 34.4* 35.9* 36.4  MCV 90.6 92.6 95.0  --  90.9 94.3  PLT 26* 20* 16*  --  33* 22*   Cardiac Enzymes: No results for input(s): CKTOTAL, CKMB, CKMBINDEX, TROPONINI in the last 168 hours. D-Dimer No results for input(s): DDIMER in the last 72 hours. BNP: Invalid input(s): POCBNP CBG: Recent Labs  Lab 09/10/18 2036 September 27, 2018 0024  10/01/18 0423 2018/10/01 0425 2018-10-01 0444  GLUCAP 156* 95 16* 15* 127*   Anemia work up No results for input(s): VITAMINB12, FOLATE, FERRITIN, TIBC, IRON, RETICCTPCT in the last 72 hours. Urinalysis    Component Value Date/Time   COLORURINE YELLOW 09/12/2018 0800   APPEARANCEUR HAZY (A) 08/15/2018 0800   LABSPEC 1.022 09/10/2018 0800   PHURINE 5.0 08/30/2018 0800   GLUCOSEU >=500 (A) 08/15/2018 0800   HGBUR NEGATIVE 08/29/2018 0800   BILIRUBINUR NEGATIVE 08/23/2018 0800    BILIRUBINUR negative 05/31/2014 1118   KETONESUR NEGATIVE 08/26/2018 0800   PROTEINUR NEGATIVE 08/13/2018 0800   UROBILINOGEN 0.2 05/31/2014 1118   UROBILINOGEN 1.0 03/23/2010 1030   NITRITE NEGATIVE 08/30/2018 0800   LEUKOCYTESUR TRACE (A) 08/22/2018 0800   Sepsis Labs Invalid input(s): PROCALCITONIN,  WBC,  LACTICIDVEN     SIGNED:  Shelly Coss, MD  [facility] 10/01/18, 9:23 AM Pager [phone]  If 7PM-7AM, please contact night-coverage [email or web address] Password [email or web address]

## 2018-09-13 NOTE — Significant Event (Addendum)
Shift event: 73 yo female with hx of COPD, Stage III squamous cell lung cancer, throombocytopenia, DM, diastolic CHF who presented with abd pain on 08/14/2018. CT abd showed perforated diverticulitis. She underwent colectomy and colostomy by general surgery. CTA chest showed progression of the lung cancer. Oncology has followed in hospital and have stated that there is not really any other tx they can do and have suggested palliative care.  Tonight, Rapid response paged this NP because she was called to the pt's room for hypoxia and tachycardia. Pt already had orders for Metoprolol, so RN gave that x 3. HR still high. BP was holding steady. CXR was ordered by RRRN due to adventitious lung sounds. Pt had received Dilaudid and was suctioned for frothy secretions. ABG looked OK.   This NP ordered NRB and O2 sats came up to normal. Asked RN to wean O2 as soon as able since pt has a hx of COPD. Gave Lasix given lung sounds and frothy secretions and asked RRRN to call me back if pt wasn't improving after treatments.  About 15 mins later, NP called RRRN back to check on pt. Pt remains tachy after Metoprolol. Asked staff to get a rectal temp since pt is immunocompromised (cancer). Rectal temp 103. Tylenol administered and asked RN to call back after one hour with another rectal temp. Temp is likely driving her HR, so will hold off on further rate control until temp is lower. HR is ST. CXR showed opacity at the RLL which is new since last CXR. Given fever and immunocompromised state, NP ordered abx for HCAP. CBC with diff showed WBCC 2.1(up), Platelets 22 (down) with a slight left shift.  NP called RN back about 15 mins later to check on pt again. BP is now 70s and HR is still tachy. No new temp yet. Pt remains responsive. Has had about 50cc UO since Lasix. CBG 15 and D50 given. RN to recheck CBG.  Given the significant event and that pt is not looking any better with above interventions, NP called my colleague, Ms.  Blount NP, to go see the pt since NP is housed at [facility].  Jeannette Corpus will go and see pt and place any further orders. This pt is already very sick and there have been discussions with family about palliative care. However, at this time, the pt remains a full code.  KJKG, NP [facility]

## 2018-09-13 NOTE — Significant Event (Signed)
Rapid Response Event Note  Overview:  Called by bedside RN for pt with HR 140s and spO2 89% now on NRB. Instructed RN to get EKG and vitals  RT called to meet me in the room  Initial Focused Assessment: On arrival, pt tachypnic RR 36, HR 145, BP 101/80, spO2 93% on NRB. Lung sounds rhonchi throughout. Pt alert but non-verbal. IVF stopped  Interventions: EKG, Abg, CXR, NTS (+frothy secretions), NP paged, IV Lasix, rectal temp , CBC, Dilaudid, tylenol suppository  Plan of Care (if not transferred): Continue to monitor respiratory status along with HR control. Will wait to see if lasix helps remove fluid and help with breathing. HR control with PRN metoprolol. Dilaudid given for pain control and tylenol for fever. RN instructed to keep an eye on vitals and call with any questions/concerns or worsening status.   Event Summary:  called at  0150   arrived at  [place]   event ended at [place]

## 2018-09-13 NOTE — Progress Notes (Signed)
PHARMACY - ADULT TOTAL PARENTERAL NUTRITION CONSULT NOTE   Pharmacy Consult:  TPN Indication:  Prolonged ileus  Patient Measurements: Height: _0  (160 cm) Weight: 234 lb 12.6 oz (106.5 kg) IBW/kg (Calculated) : 52.4 TPN AdjBW (KG): 65.9 Body mass index is 41.59 kg/m.  Assessment:  59 YOF presented on 08/15/2018 with abdominal pain and loose stools for 2 weeks that improved minimally with loperamide.  Found to have perforated diverticulitis and underwent ex-lap with partial colectomy, end colostomy and LoA on 09/09/2018.  Patient was started on a clear liquid diet since 09/04/18 and intake has been inadequate per charting.  Pharmacy consulted to manage TPN for post-op ileus.  Patient couldn't stay awake for an interview.  Severe hypoglycemia this AM with reduced steroid, less PO intake and AKI.  TPN contains insulin; therefore instructed RN to hold around 0715 and to start D5W the same TPN rate of 25 ml/hr.  GI: hx GERD on PO Prilosec. Prealbumin remains low at 5.7. Body fluid draining around JP drain, O/P 85m, +BM Endo: DM on metformin and Lantus 50/d PTA, A1c 8.5%.  CBGs elevated with TPN/steroid, improved with increasing insulin and HC > prednisone, hypoglycemic 1/30 AM b/c reduced prednisone/less PO intake Insulin requirements in the past 24 hours: 24 units + 30 units in TPN Lytes: high Na/CL (none in TPN), K high normal (418m BID yesterday while on IV Lasix), Phos elevated at 4.7 (Ca x Phos = 44, goal < 55), Mag low normal  Renal: AKI - SCr up to 1.48, BUN up to 41 - UOP 1.1 ml/kg/hr, LR at 10 ml/hr, net +2.8L Pulm: COPD - up to 8L  - Brovana, Pulmicort, Duonebs Cards: HTN/HLD - CTA neg for PE. 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 CorBrantley StagePICC placed 09/06/18 TPN start date: 09/06/18  Nutritional Goals (RD rec on 1/25): 1650-1850 kCal and 115-130gm protein per day  Current Nutrition:  TPN Full liquid diet Ensure ENlive TID (1 charted given)  Plan:  Patient passed  [name] D. DanMina MarbleharmD, BCPS, BCCLake Royale3Feb 01, 2020:19 AM

## 2018-09-13 NOTE — Progress Notes (Addendum)
At bedside at the request of colleague to assess pt condition. On arrival, pt is lethargic, tachycardiac, hypotensive with AMS. Lung sounds are rhonchus throughout and diminished at bases. Review of chart shows pt has new onset pneumonia and an abdominal surgical wound that is not healing. This patient condition is unlikely to improve over the course of the hospital stay so both son and husband were called to discuss code status and palliative measures. Per the request of the husband, I spoke with the son to ask about making pt a DNR. The son has opted to make pt DNR at this time and husband will make his way to the hospital this morning to discuss further management of patient's poor prognosis.  Lovey Newcomer, NP [facility] 7p-7a [phone]

## 2018-09-13 NOTE — Progress Notes (Signed)
Pharmacy Antibiotic Note  [name] is a 73 y.o. female admitted on 08/14/2018 for abdominal pain, now w/ exam and CXR concerning for pneumonia.  Pharmacy has been consulted for vancomycin and cefepime dosing.  Of note pt's SCr has doubled in the last 24hr.  Plan: Vancomycin 2000mg  IV x1; monitor SCr prior to additional dosing. Cefepime 2g IV Q24H.  Height: 5\' 3"  (160 cm) Weight: 234 lb 12.6 oz (106.5 kg) IBW/kg (Calculated) : 52.4  Temp (24hrs), Avg:100 F (37.8 C), Min:98.2 F (36.8 C), Max:103 F (39.4 C)  Recent Labs  Lab 09/07/18 0408 09/08/18 0505 09/09/18 0500 09/09/18 1655 09/10/18 0500 09/10/18 1426 2018-09-15 0313 09/15/18 0314  WBC 4.7 2.5* 1.2*  --   --  1.7* 2.1*  --   CREATININE 0.62 0.58 0.61 0.64 0.72  --   --  1.48*    Estimated Creatinine Clearance: 41.3 mL/min (A) (by C-G formula based on SCr of 1.48 mg/dL (H)).    Allergies  Allergen Reactions  . Bee Venom Anaphylaxis  . Penicillins Itching    Blisters in mouth after dental work Has patient had a PCN reaction causing immediate rash, facial/tongue/throat swelling, SOB or lightheadedness with hypotension: no Has patient had a PCN reaction causing severe rash involving mucus membranes or skin necrosis: No Has patient had a PCN reaction that required hospitalization No Has patient had a PCN reaction occurring within the last 10 years: Yes If all of the above answers are "NO", then may proceed with Cephalosporin use.   . Codeine   . Motrin [Ibuprofen]     "messes" with her head.     Thank you for allowing pharmacy to be a part of this patient's care.  Wynona Neat, PharmD, BCPS  09-15-2018 4:11 AM

## 2018-09-13 DEATH — deceased

## 2018-09-15 LAB — GLUCOSE, CAPILLARY: Glucose-Capillary: 16 mg/dL — CL (ref 70–99)

## 2018-09-24 ENCOUNTER — Ambulatory Visit: Payer: Self-pay | Admitting: Nutrition

## 2018-10-14 IMAGING — CT CT CHEST W/ CM
2 of 3 series · 15 of 36 positions shown, 18 images · IV contrast (omnipaque)
Comparison: 11/13/2017 PET-CT.  10/28/2017 chest CT.

CLINICAL DATA: Stage IIIA left upper lobe squamous cell lung
carcinoma diagnosed October 2017 status post chemoradiation therapy.
Radiation therapy completed 03/25/2018. Restaging.

EXAM:
CT CHEST WITH CONTRAST
TECHNIQUE: Multidetector CT imaging of the chest was performed during
intravenous contrast administration.
CONTRAST:  75mL OMNIPAQUE IOHEXOL 300 MG/ML  SOLN

[Series 2: axial st · axial · 0.61mm/px · z∈[-300,-82]mm · 12 of 129 slices shown, 15 images]
[im 10/129  mediastinal]
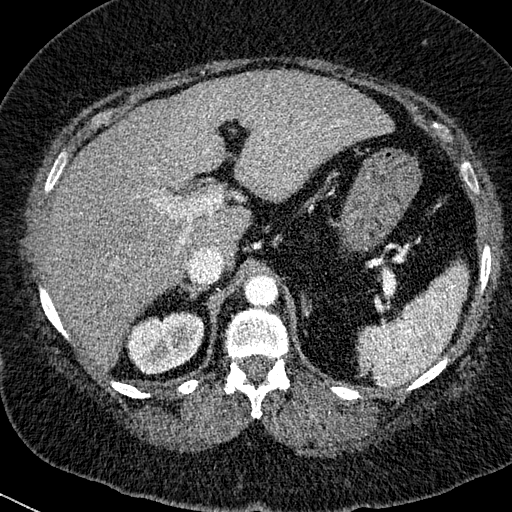
[im 10/129  lung]
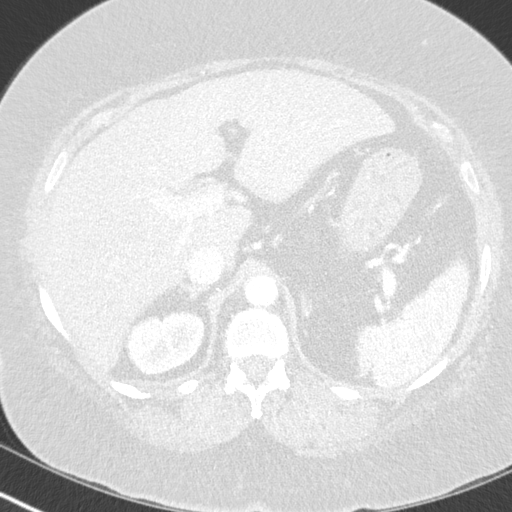
[im 19/129  lung]
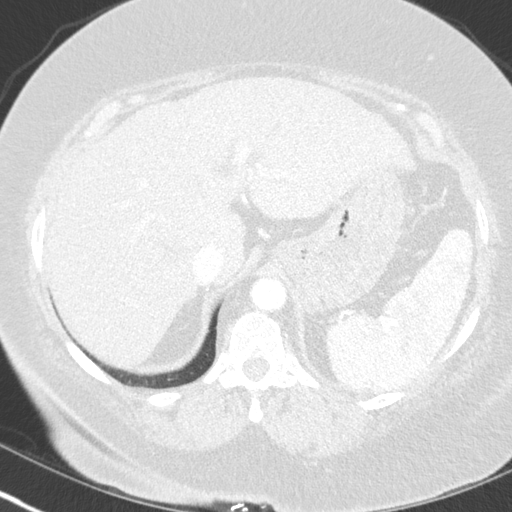
[im 29/129  lung]
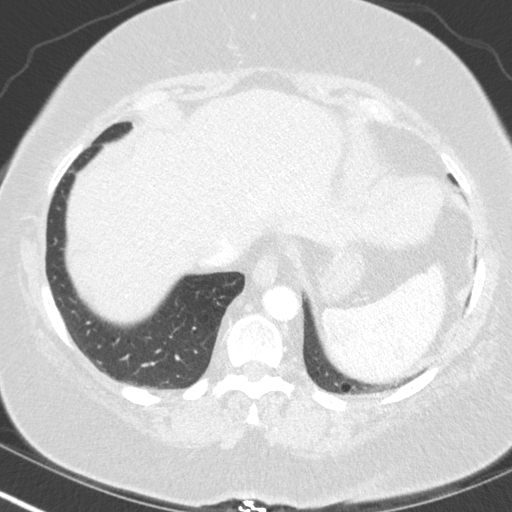
[im 38/129  lung]
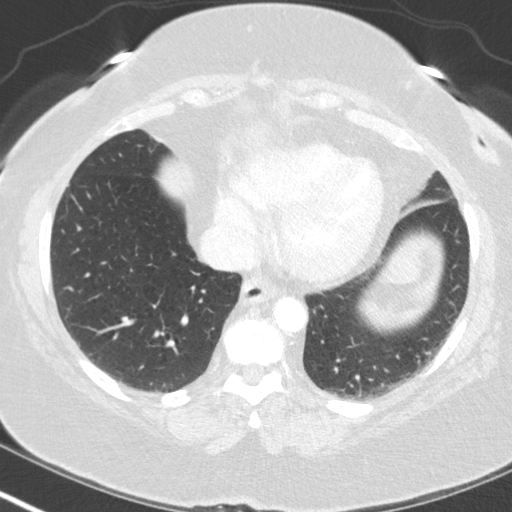
[im 48/129  mediastinal]
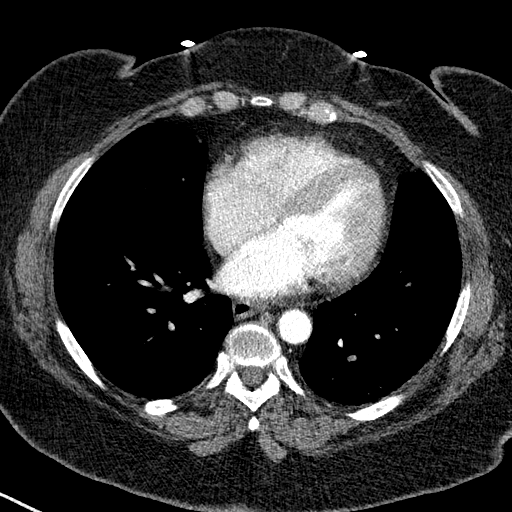
[im 48/129  lung]
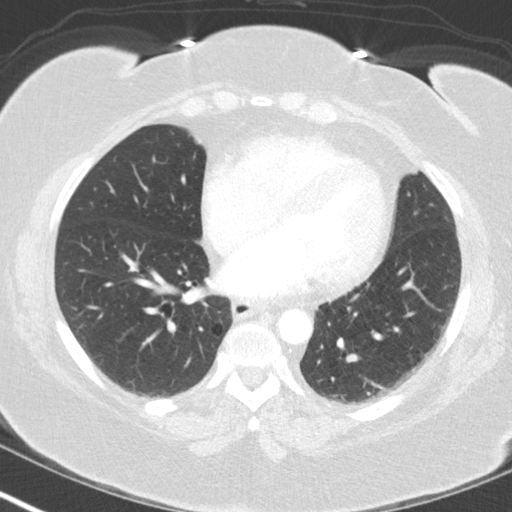
[im 57/129  lung]
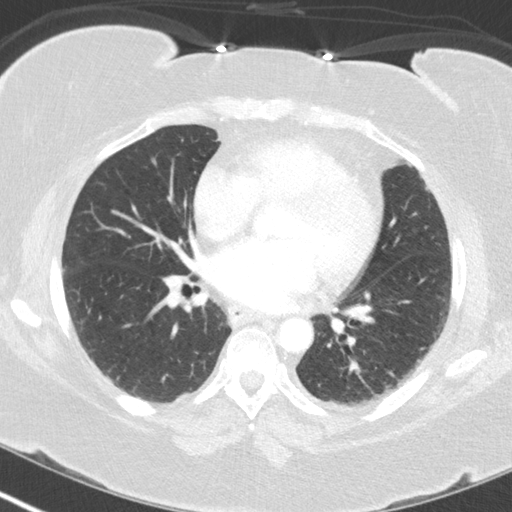
[im 72/129  lung]
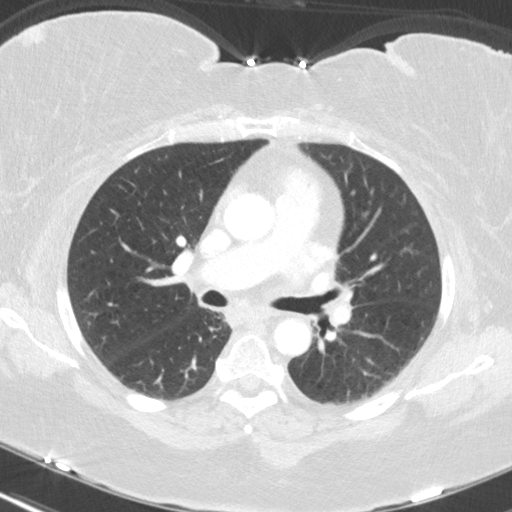
[im 81/129  lung]
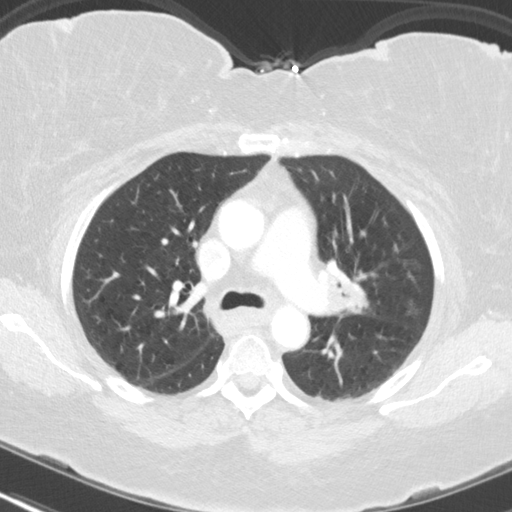
[im 91/129  mediastinal]
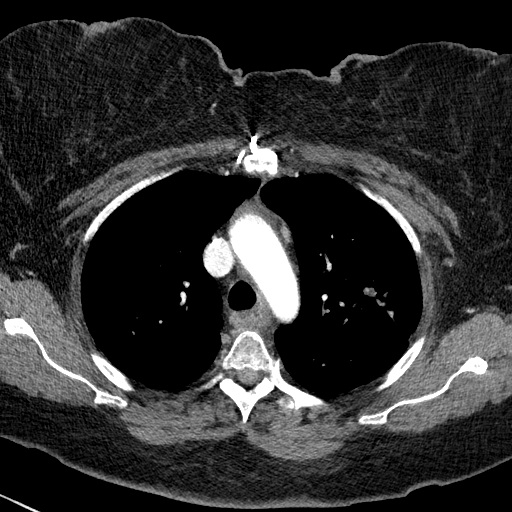
[im 91/129  lung]
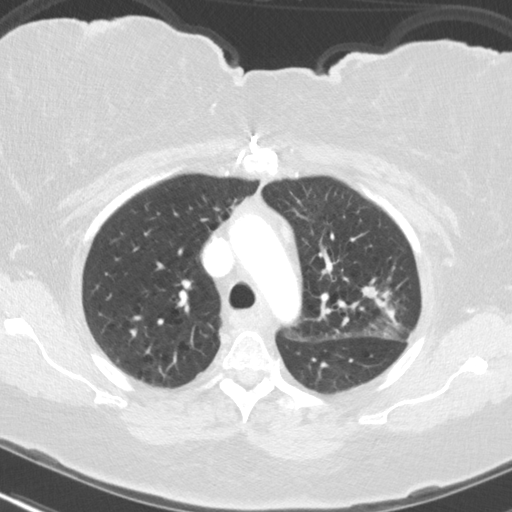
[im 100/129  lung]
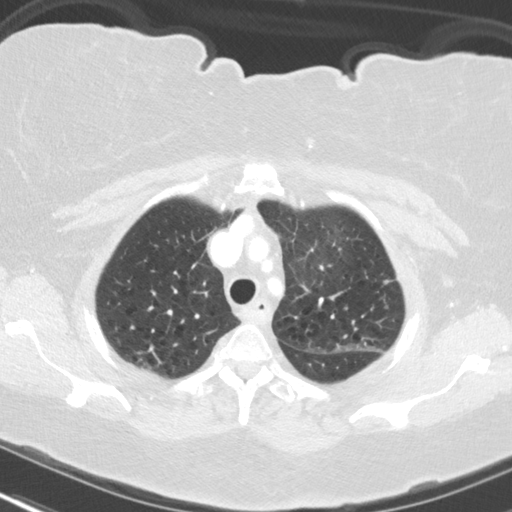
[im 110/129  lung]
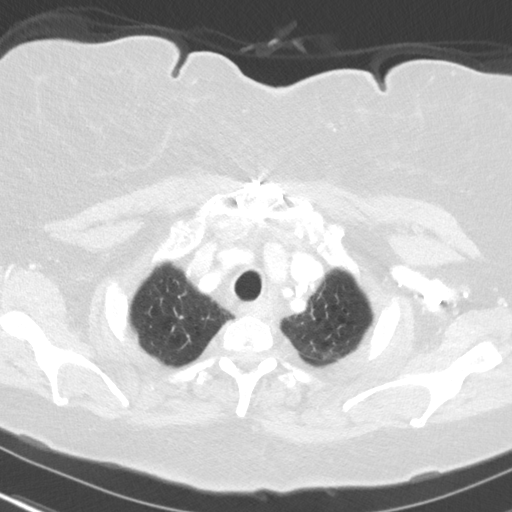
[im 119/129  lung]
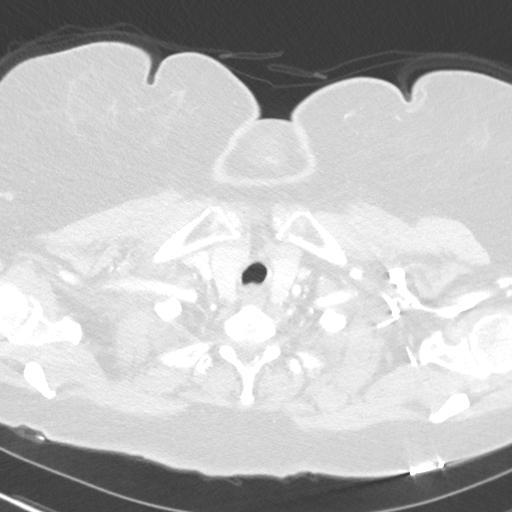

[Series 7: coronal · coronal · 0.53mm/px · 3 of 134 slices shown]
[im 27/134  lung]
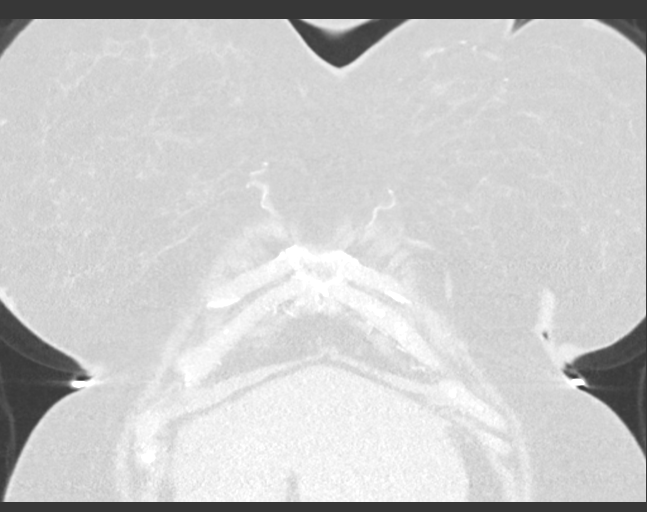
[im 54/134  lung]
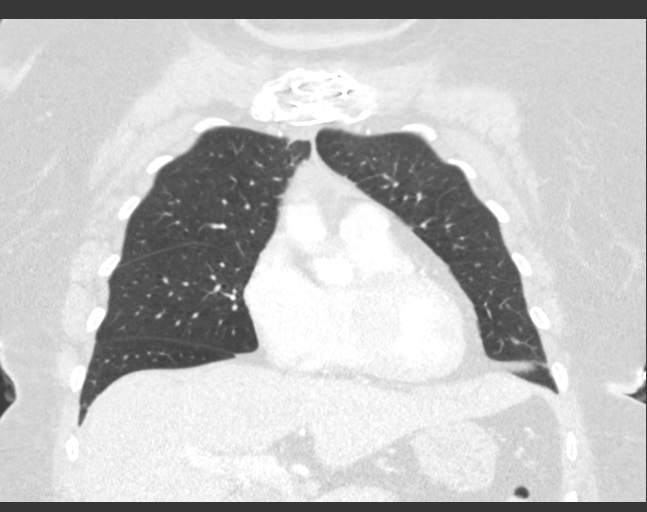
[im 80/134  lung]
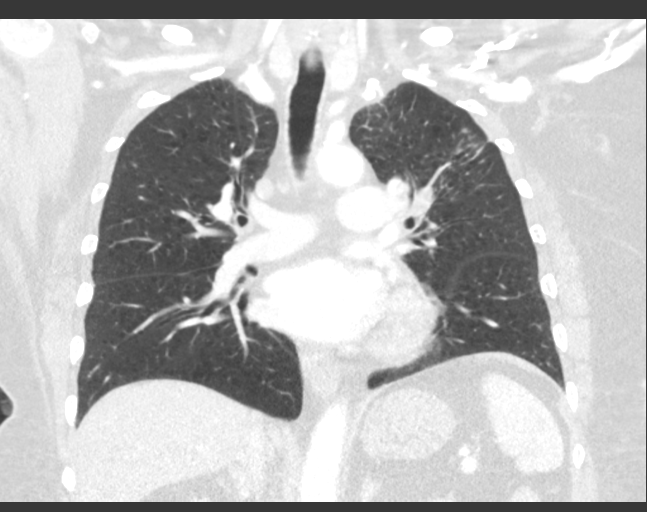

[15 of 36 positions shown; findings below may reference images not displayed]

FINDINGS: Cardiovascular: Normal heart size. No significant pericardial
effusion/thickening. Left subclavian MediPort terminates in the
middle third of the superior vena cava. Atherosclerotic
nonaneurysmal thoracic aorta. Normal caliber pulmonary arteries. No
central pulmonary emboli.

Mediastinum/Nodes: Hypodense 1.4 cm right thyroid lobe nodule is
stable. Unremarkable esophagus. No pathologically enlarged axillary,
mediastinal or hilar lymph nodes.

Lungs/Pleura: No pneumothorax. No pleural effusion. Moderate
centrilobular emphysema with mild diffuse bronchial wall thickening.
Central left upper lobe 2.1 x 1.6 cm pulmonary nodule (series
2/image 49), decreased from 3.4 x 3.0 cm. Decreased extrinsic
narrowing of the segmental left upper lobe bronchi. Clustered
satellite nodularity in the posterior left upper lobe has decreased.
For example a 0.9 cm left upper lobe nodule (series 3/image 39),
decreased from 1.6 cm. A 1.2 cm posterior left upper lobe nodule
(series 3/image 45), decreased from 1.8 cm. No acute consolidative
airspace disease or new significant pulmonary nodules.

Upper abdomen: No acute abnormality.

Musculoskeletal: No aggressive appearing focal osseous lesions.
Intact sternotomy wires.
IMPRESSION: 1. Central left upper lobe lung mass has decreased in size.
Clustered satellite nodularity in the posterior left upper lobe has
decreased. No new or progressive metastatic disease in the chest.
Findings are compatible with partial treatment response.
2. Stable 1.4 cm right thyroid lobe nodule.

Aortic Atherosclerosis (FT65A-J5C.C) and Emphysema (FT65A-2JY.9).

## 2018-11-04 ENCOUNTER — Ambulatory Visit: Payer: Self-pay | Admitting: Family Medicine
